# Patient Record
Sex: Female | Born: 1955 | Race: Black or African American | Hispanic: No | Marital: Married | Smoking: Former smoker
Health system: Southern US, Community
[De-identification: ages and names within clinical notes are randomized; demographics above are authoritative.]

## PROBLEM LIST (undated history)

## (undated) DIAGNOSIS — G609 Hereditary and idiopathic neuropathy, unspecified: Secondary | ICD-10-CM

## (undated) DIAGNOSIS — I509 Heart failure, unspecified: Secondary | ICD-10-CM

## (undated) DIAGNOSIS — F329 Major depressive disorder, single episode, unspecified: Secondary | ICD-10-CM

## (undated) DIAGNOSIS — F419 Anxiety disorder, unspecified: Secondary | ICD-10-CM

## (undated) DIAGNOSIS — J45909 Unspecified asthma, uncomplicated: Secondary | ICD-10-CM

## (undated) DIAGNOSIS — M549 Dorsalgia, unspecified: Secondary | ICD-10-CM

## (undated) DIAGNOSIS — C3490 Malignant neoplasm of unspecified part of unspecified bronchus or lung: Secondary | ICD-10-CM

## (undated) DIAGNOSIS — G8929 Other chronic pain: Secondary | ICD-10-CM

## (undated) DIAGNOSIS — E039 Hypothyroidism, unspecified: Secondary | ICD-10-CM

## (undated) DIAGNOSIS — E559 Vitamin D deficiency, unspecified: Secondary | ICD-10-CM

## (undated) DIAGNOSIS — IMO0002 Reserved for concepts with insufficient information to code with codable children: Secondary | ICD-10-CM

## (undated) DIAGNOSIS — J42 Unspecified chronic bronchitis: Secondary | ICD-10-CM

## (undated) DIAGNOSIS — R9431 Abnormal electrocardiogram [ECG] [EKG]: Secondary | ICD-10-CM

## (undated) DIAGNOSIS — Z9981 Dependence on supplemental oxygen: Secondary | ICD-10-CM

## (undated) DIAGNOSIS — J439 Emphysema, unspecified: Secondary | ICD-10-CM

## (undated) DIAGNOSIS — K861 Other chronic pancreatitis: Secondary | ICD-10-CM

## (undated) DIAGNOSIS — F17200 Nicotine dependence, unspecified, uncomplicated: Secondary | ICD-10-CM

## (undated) DIAGNOSIS — I10 Essential (primary) hypertension: Secondary | ICD-10-CM

## (undated) DIAGNOSIS — K219 Gastro-esophageal reflux disease without esophagitis: Secondary | ICD-10-CM

## (undated) DIAGNOSIS — B1920 Unspecified viral hepatitis C without hepatic coma: Secondary | ICD-10-CM

## (undated) DIAGNOSIS — E7800 Pure hypercholesterolemia, unspecified: Secondary | ICD-10-CM

## (undated) DIAGNOSIS — R609 Edema, unspecified: Secondary | ICD-10-CM

## (undated) DIAGNOSIS — L309 Dermatitis, unspecified: Secondary | ICD-10-CM

## (undated) DIAGNOSIS — M810 Age-related osteoporosis without current pathological fracture: Secondary | ICD-10-CM

## (undated) DIAGNOSIS — R7303 Prediabetes: Secondary | ICD-10-CM

## (undated) HISTORY — PX: BREAST BIOPSY: SHX20

## (undated) HISTORY — DX: Gastro-esophageal reflux disease without esophagitis: K21.9

## (undated) HISTORY — PX: CARDIAC CATHETERIZATION: SHX172

## (undated) HISTORY — DX: Age-related osteoporosis without current pathological fracture: M81.0

## (undated) HISTORY — DX: Essential (primary) hypertension: I10

## (undated) HISTORY — PX: TONSILLECTOMY: SUR1361

## (undated) HISTORY — DX: Other chronic pancreatitis: K86.1

## (undated) HISTORY — PX: TUBAL LIGATION: SHX77

## (undated) HISTORY — PX: DILATION AND CURETTAGE OF UTERUS: SHX78

## (undated) HISTORY — PX: COLONOSCOPY: SHX174

## (undated) HISTORY — DX: Reserved for concepts with insufficient information to code with codable children: IMO0002

## (undated) HISTORY — DX: Dermatitis, unspecified: L30.9

## (undated) HISTORY — PX: BRAIN SURGERY: SHX531

## (undated) HISTORY — DX: Emphysema, unspecified: J43.9

## (undated) HISTORY — DX: Major depressive disorder, single episode, unspecified: F32.9

## (undated) HISTORY — DX: Edema, unspecified: R60.9

## (undated) HISTORY — PX: LUMBAR LAMINECTOMY: SHX95

## (undated) HISTORY — DX: Hypothyroidism, unspecified: E03.9

## (undated) HISTORY — DX: Nicotine dependence, unspecified, uncomplicated: F17.200

## (undated) HISTORY — DX: Vitamin D deficiency, unspecified: E55.9

## (undated) HISTORY — PX: LAPAROSCOPIC CHOLECYSTECTOMY: SUR755

## (undated) HISTORY — PX: LUMBAR FUSION: SHX111

## (undated) HISTORY — DX: Hereditary and idiopathic neuropathy, unspecified: G60.9

---

## 1998-04-09 ENCOUNTER — Ambulatory Visit (HOSPITAL_COMMUNITY): Admission: RE | Admit: 1998-04-09 | Discharge: 1998-04-09 | Payer: Self-pay | Admitting: Neurosurgery

## 1998-04-21 ENCOUNTER — Ambulatory Visit (HOSPITAL_BASED_OUTPATIENT_CLINIC_OR_DEPARTMENT_OTHER): Admission: RE | Admit: 1998-04-21 | Discharge: 1998-04-21 | Payer: Self-pay | Admitting: Otolaryngology

## 1998-04-23 ENCOUNTER — Ambulatory Visit (HOSPITAL_COMMUNITY): Admission: RE | Admit: 1998-04-23 | Discharge: 1998-04-23 | Payer: Self-pay | Admitting: Neurosurgery

## 1998-05-08 ENCOUNTER — Ambulatory Visit (HOSPITAL_COMMUNITY): Admission: RE | Admit: 1998-05-08 | Discharge: 1998-05-08 | Payer: Self-pay | Admitting: Neurosurgery

## 1998-07-23 ENCOUNTER — Encounter: Payer: Self-pay | Admitting: Otolaryngology

## 1998-07-25 ENCOUNTER — Inpatient Hospital Stay (HOSPITAL_COMMUNITY): Admission: RE | Admit: 1998-07-25 | Discharge: 1998-07-29 | Payer: Self-pay | Admitting: Otolaryngology

## 1998-07-25 ENCOUNTER — Encounter: Payer: Self-pay | Admitting: Otolaryngology

## 1999-02-17 ENCOUNTER — Encounter: Payer: Self-pay | Admitting: Neurosurgery

## 1999-02-17 ENCOUNTER — Ambulatory Visit (HOSPITAL_COMMUNITY): Admission: RE | Admit: 1999-02-17 | Discharge: 1999-02-17 | Payer: Self-pay | Admitting: Neurosurgery

## 1999-05-11 ENCOUNTER — Emergency Department (HOSPITAL_COMMUNITY): Admission: EM | Admit: 1999-05-11 | Discharge: 1999-05-11 | Payer: Self-pay | Admitting: Emergency Medicine

## 1999-05-11 ENCOUNTER — Encounter: Payer: Self-pay | Admitting: *Deleted

## 1999-07-31 ENCOUNTER — Encounter: Admission: RE | Admit: 1999-07-31 | Discharge: 1999-07-31 | Payer: Self-pay | Admitting: Emergency Medicine

## 1999-07-31 ENCOUNTER — Encounter: Payer: Self-pay | Admitting: Emergency Medicine

## 1999-09-04 ENCOUNTER — Encounter: Admission: RE | Admit: 1999-09-04 | Discharge: 1999-09-04 | Payer: Self-pay | Admitting: Emergency Medicine

## 1999-09-04 ENCOUNTER — Encounter: Payer: Self-pay | Admitting: Emergency Medicine

## 1999-09-17 ENCOUNTER — Other Ambulatory Visit: Admission: RE | Admit: 1999-09-17 | Discharge: 1999-09-17 | Payer: Self-pay | Admitting: Obstetrics & Gynecology

## 1999-09-17 ENCOUNTER — Encounter: Payer: Self-pay | Admitting: Neurosurgery

## 1999-09-18 ENCOUNTER — Other Ambulatory Visit: Admission: RE | Admit: 1999-09-18 | Discharge: 1999-09-18 | Payer: Self-pay | Admitting: Obstetrics & Gynecology

## 1999-09-18 ENCOUNTER — Encounter (INDEPENDENT_AMBULATORY_CARE_PROVIDER_SITE_OTHER): Payer: Self-pay

## 1999-09-21 ENCOUNTER — Encounter: Payer: Self-pay | Admitting: Neurosurgery

## 1999-09-21 ENCOUNTER — Ambulatory Visit (HOSPITAL_COMMUNITY): Admission: RE | Admit: 1999-09-21 | Discharge: 1999-09-22 | Payer: Self-pay | Admitting: Neurosurgery

## 2000-01-18 ENCOUNTER — Inpatient Hospital Stay (HOSPITAL_COMMUNITY): Admission: RE | Admit: 2000-01-18 | Discharge: 2000-01-21 | Payer: Self-pay | Admitting: Neurosurgery

## 2000-02-16 ENCOUNTER — Encounter: Admission: RE | Admit: 2000-02-16 | Discharge: 2000-02-16 | Payer: Self-pay | Admitting: Neurosurgery

## 2000-02-16 ENCOUNTER — Encounter: Payer: Self-pay | Admitting: Neurosurgery

## 2000-03-29 ENCOUNTER — Encounter: Payer: Self-pay | Admitting: Neurosurgery

## 2000-03-29 ENCOUNTER — Encounter: Admission: RE | Admit: 2000-03-29 | Discharge: 2000-03-29 | Payer: Self-pay | Admitting: Neurosurgery

## 2000-06-21 ENCOUNTER — Encounter: Payer: Self-pay | Admitting: Neurosurgery

## 2000-06-21 ENCOUNTER — Encounter: Admission: RE | Admit: 2000-06-21 | Discharge: 2000-06-21 | Payer: Self-pay | Admitting: Neurosurgery

## 2000-09-23 ENCOUNTER — Other Ambulatory Visit: Admission: RE | Admit: 2000-09-23 | Discharge: 2000-09-23 | Payer: Self-pay | Admitting: Obstetrics & Gynecology

## 2000-11-29 ENCOUNTER — Encounter: Admission: RE | Admit: 2000-11-29 | Discharge: 2000-11-29 | Payer: Self-pay | Admitting: Neurosurgery

## 2000-11-29 ENCOUNTER — Encounter: Payer: Self-pay | Admitting: Neurosurgery

## 2000-12-12 ENCOUNTER — Ambulatory Visit (HOSPITAL_COMMUNITY): Admission: RE | Admit: 2000-12-12 | Discharge: 2000-12-12 | Payer: Self-pay | Admitting: Neurosurgery

## 2000-12-12 ENCOUNTER — Encounter: Payer: Self-pay | Admitting: Neurosurgery

## 2000-12-26 ENCOUNTER — Ambulatory Visit (HOSPITAL_COMMUNITY): Admission: RE | Admit: 2000-12-26 | Discharge: 2000-12-26 | Payer: Self-pay | Admitting: Neurosurgery

## 2000-12-26 ENCOUNTER — Encounter: Payer: Self-pay | Admitting: Neurosurgery

## 2001-01-09 ENCOUNTER — Encounter: Admission: RE | Admit: 2001-01-09 | Discharge: 2001-01-09 | Payer: Self-pay | Admitting: Neurosurgery

## 2001-01-09 ENCOUNTER — Encounter: Payer: Self-pay | Admitting: Neurosurgery

## 2001-01-14 ENCOUNTER — Encounter: Payer: Self-pay | Admitting: Emergency Medicine

## 2001-01-14 ENCOUNTER — Emergency Department (HOSPITAL_COMMUNITY): Admission: EM | Admit: 2001-01-14 | Discharge: 2001-01-14 | Payer: Self-pay | Admitting: Emergency Medicine

## 2001-01-19 ENCOUNTER — Ambulatory Visit (HOSPITAL_COMMUNITY): Admission: RE | Admit: 2001-01-19 | Discharge: 2001-01-19 | Payer: Self-pay | Admitting: Obstetrics & Gynecology

## 2001-01-30 ENCOUNTER — Encounter: Payer: Self-pay | Admitting: Internal Medicine

## 2001-01-30 ENCOUNTER — Inpatient Hospital Stay (HOSPITAL_COMMUNITY): Admission: EM | Admit: 2001-01-30 | Discharge: 2001-02-06 | Payer: Self-pay | Admitting: Emergency Medicine

## 2001-02-01 ENCOUNTER — Encounter: Payer: Self-pay | Admitting: Internal Medicine

## 2001-02-03 ENCOUNTER — Encounter: Payer: Self-pay | Admitting: Orthopedic Surgery

## 2001-03-21 ENCOUNTER — Encounter: Payer: Self-pay | Admitting: Neurosurgery

## 2001-03-21 ENCOUNTER — Encounter: Admission: RE | Admit: 2001-03-21 | Discharge: 2001-03-21 | Payer: Self-pay | Admitting: Neurosurgery

## 2001-04-13 ENCOUNTER — Ambulatory Visit (HOSPITAL_COMMUNITY): Admission: RE | Admit: 2001-04-13 | Discharge: 2001-04-13 | Payer: Self-pay | Admitting: Internal Medicine

## 2001-04-13 ENCOUNTER — Encounter: Payer: Self-pay | Admitting: Internal Medicine

## 2001-05-13 ENCOUNTER — Inpatient Hospital Stay (HOSPITAL_COMMUNITY): Admission: EM | Admit: 2001-05-13 | Discharge: 2001-05-17 | Payer: Self-pay | Admitting: Emergency Medicine

## 2001-05-13 ENCOUNTER — Encounter: Payer: Self-pay | Admitting: Emergency Medicine

## 2001-05-15 ENCOUNTER — Encounter: Payer: Self-pay | Admitting: Internal Medicine

## 2001-06-19 ENCOUNTER — Other Ambulatory Visit: Admission: RE | Admit: 2001-06-19 | Discharge: 2001-06-19 | Payer: Self-pay | Admitting: Emergency Medicine

## 2001-07-07 ENCOUNTER — Encounter: Admission: RE | Admit: 2001-07-07 | Discharge: 2001-07-07 | Payer: Self-pay | Admitting: Emergency Medicine

## 2001-07-07 ENCOUNTER — Encounter: Payer: Self-pay | Admitting: Emergency Medicine

## 2002-03-26 ENCOUNTER — Encounter: Admission: RE | Admit: 2002-03-26 | Discharge: 2002-03-26 | Payer: Self-pay | Admitting: Emergency Medicine

## 2002-03-26 ENCOUNTER — Encounter: Payer: Self-pay | Admitting: Emergency Medicine

## 2002-07-24 ENCOUNTER — Encounter: Admission: RE | Admit: 2002-07-24 | Discharge: 2002-10-22 | Payer: Self-pay | Admitting: Anesthesiology

## 2002-10-25 ENCOUNTER — Encounter: Admission: RE | Admit: 2002-10-25 | Discharge: 2003-01-23 | Payer: Self-pay

## 2003-01-22 ENCOUNTER — Encounter
Admission: RE | Admit: 2003-01-22 | Discharge: 2003-04-22 | Payer: Self-pay | Admitting: Physical Medicine & Rehabilitation

## 2003-05-04 ENCOUNTER — Inpatient Hospital Stay (HOSPITAL_COMMUNITY): Admission: EM | Admit: 2003-05-04 | Discharge: 2003-05-08 | Payer: Self-pay | Admitting: Emergency Medicine

## 2003-05-06 ENCOUNTER — Encounter: Payer: Self-pay | Admitting: Gastroenterology

## 2003-05-06 ENCOUNTER — Encounter
Admission: RE | Admit: 2003-05-06 | Discharge: 2003-08-04 | Payer: Self-pay | Admitting: Physical Medicine & Rehabilitation

## 2003-05-06 ENCOUNTER — Encounter: Payer: Self-pay | Admitting: Internal Medicine

## 2003-09-03 ENCOUNTER — Encounter
Admission: RE | Admit: 2003-09-03 | Discharge: 2003-12-02 | Payer: Self-pay | Admitting: Physical Medicine & Rehabilitation

## 2004-01-01 ENCOUNTER — Encounter
Admission: RE | Admit: 2004-01-01 | Discharge: 2004-03-31 | Payer: Self-pay | Admitting: Physical Medicine & Rehabilitation

## 2004-04-28 ENCOUNTER — Encounter
Admission: RE | Admit: 2004-04-28 | Discharge: 2004-07-02 | Payer: Self-pay | Admitting: Physical Medicine & Rehabilitation

## 2004-07-02 ENCOUNTER — Encounter
Admission: RE | Admit: 2004-07-02 | Discharge: 2004-09-30 | Payer: Self-pay | Admitting: Physical Medicine & Rehabilitation

## 2004-07-06 ENCOUNTER — Ambulatory Visit: Payer: Self-pay | Admitting: Physical Medicine & Rehabilitation

## 2004-08-28 ENCOUNTER — Ambulatory Visit: Payer: Self-pay | Admitting: Internal Medicine

## 2004-09-14 ENCOUNTER — Ambulatory Visit: Payer: Self-pay | Admitting: Physical Medicine & Rehabilitation

## 2004-12-10 ENCOUNTER — Encounter
Admission: RE | Admit: 2004-12-10 | Discharge: 2005-03-10 | Payer: Self-pay | Admitting: Physical Medicine & Rehabilitation

## 2004-12-11 ENCOUNTER — Ambulatory Visit: Payer: Self-pay | Admitting: Physical Medicine & Rehabilitation

## 2005-03-10 ENCOUNTER — Ambulatory Visit: Payer: Self-pay | Admitting: Physical Medicine & Rehabilitation

## 2005-03-10 ENCOUNTER — Encounter
Admission: RE | Admit: 2005-03-10 | Discharge: 2005-06-08 | Payer: Self-pay | Admitting: Physical Medicine & Rehabilitation

## 2005-06-03 ENCOUNTER — Ambulatory Visit: Payer: Self-pay | Admitting: Physical Medicine & Rehabilitation

## 2005-09-03 ENCOUNTER — Encounter
Admission: RE | Admit: 2005-09-03 | Discharge: 2005-12-02 | Payer: Self-pay | Admitting: Physical Medicine & Rehabilitation

## 2005-09-03 ENCOUNTER — Ambulatory Visit: Payer: Self-pay | Admitting: Physical Medicine & Rehabilitation

## 2005-11-29 ENCOUNTER — Ambulatory Visit: Payer: Self-pay | Admitting: Physical Medicine & Rehabilitation

## 2006-01-21 ENCOUNTER — Encounter
Admission: RE | Admit: 2006-01-21 | Discharge: 2006-04-21 | Payer: Self-pay | Admitting: Physical Medicine & Rehabilitation

## 2006-01-21 ENCOUNTER — Ambulatory Visit: Payer: Self-pay | Admitting: Physical Medicine & Rehabilitation

## 2006-02-08 ENCOUNTER — Ambulatory Visit: Payer: Self-pay | Admitting: Internal Medicine

## 2006-02-17 ENCOUNTER — Ambulatory Visit: Payer: Self-pay | Admitting: Internal Medicine

## 2006-02-21 ENCOUNTER — Ambulatory Visit: Payer: Self-pay | Admitting: Gastroenterology

## 2006-02-24 ENCOUNTER — Ambulatory Visit (HOSPITAL_COMMUNITY): Admission: RE | Admit: 2006-02-24 | Discharge: 2006-02-24 | Payer: Self-pay | Admitting: Gastroenterology

## 2006-03-02 ENCOUNTER — Ambulatory Visit: Payer: Self-pay | Admitting: Gastroenterology

## 2006-03-17 ENCOUNTER — Ambulatory Visit: Payer: Self-pay | Admitting: Physical Medicine & Rehabilitation

## 2006-03-30 ENCOUNTER — Ambulatory Visit: Payer: Self-pay | Admitting: Internal Medicine

## 2006-05-17 ENCOUNTER — Ambulatory Visit: Payer: Self-pay | Admitting: Physical Medicine & Rehabilitation

## 2006-05-17 ENCOUNTER — Encounter
Admission: RE | Admit: 2006-05-17 | Discharge: 2006-08-15 | Payer: Self-pay | Admitting: Physical Medicine & Rehabilitation

## 2006-08-16 ENCOUNTER — Encounter
Admission: RE | Admit: 2006-08-16 | Discharge: 2006-11-14 | Payer: Self-pay | Admitting: Physical Medicine & Rehabilitation

## 2006-08-16 ENCOUNTER — Ambulatory Visit: Payer: Self-pay | Admitting: Physical Medicine & Rehabilitation

## 2006-08-17 ENCOUNTER — Emergency Department (HOSPITAL_COMMUNITY): Admission: EM | Admit: 2006-08-17 | Discharge: 2006-08-17 | Payer: Self-pay | Admitting: Emergency Medicine

## 2006-08-23 ENCOUNTER — Ambulatory Visit: Payer: Self-pay | Admitting: Gastroenterology

## 2006-09-27 ENCOUNTER — Emergency Department (HOSPITAL_COMMUNITY): Admission: EM | Admit: 2006-09-27 | Discharge: 2006-09-27 | Payer: Self-pay | Admitting: Emergency Medicine

## 2006-10-06 ENCOUNTER — Ambulatory Visit: Payer: Self-pay | Admitting: Gastroenterology

## 2006-10-20 ENCOUNTER — Ambulatory Visit (HOSPITAL_COMMUNITY): Admission: RE | Admit: 2006-10-20 | Discharge: 2006-10-20 | Payer: Self-pay | Admitting: Gastroenterology

## 2006-10-20 ENCOUNTER — Encounter (INDEPENDENT_AMBULATORY_CARE_PROVIDER_SITE_OTHER): Payer: Self-pay | Admitting: *Deleted

## 2006-12-13 ENCOUNTER — Ambulatory Visit: Payer: Self-pay | Admitting: Physical Medicine & Rehabilitation

## 2006-12-13 ENCOUNTER — Encounter
Admission: RE | Admit: 2006-12-13 | Discharge: 2007-03-13 | Payer: Self-pay | Admitting: Physical Medicine & Rehabilitation

## 2006-12-27 ENCOUNTER — Ambulatory Visit: Payer: Self-pay | Admitting: Gastroenterology

## 2007-02-12 ENCOUNTER — Emergency Department (HOSPITAL_COMMUNITY): Admission: EM | Admit: 2007-02-12 | Discharge: 2007-02-12 | Payer: Self-pay | Admitting: Emergency Medicine

## 2007-03-31 ENCOUNTER — Ambulatory Visit: Payer: Self-pay | Admitting: Physical Medicine & Rehabilitation

## 2007-03-31 ENCOUNTER — Encounter
Admission: RE | Admit: 2007-03-31 | Discharge: 2007-06-29 | Payer: Self-pay | Admitting: Physical Medicine & Rehabilitation

## 2007-04-11 ENCOUNTER — Emergency Department (HOSPITAL_COMMUNITY): Admission: EM | Admit: 2007-04-11 | Discharge: 2007-04-12 | Payer: Self-pay | Admitting: Emergency Medicine

## 2007-04-17 ENCOUNTER — Encounter: Admission: RE | Admit: 2007-04-17 | Discharge: 2007-04-17 | Payer: Self-pay | Admitting: Emergency Medicine

## 2007-04-20 ENCOUNTER — Encounter: Admission: RE | Admit: 2007-04-20 | Discharge: 2007-04-20 | Payer: Self-pay | Admitting: Emergency Medicine

## 2007-06-28 ENCOUNTER — Ambulatory Visit: Payer: Self-pay | Admitting: Physical Medicine & Rehabilitation

## 2007-06-28 ENCOUNTER — Encounter
Admission: RE | Admit: 2007-06-28 | Discharge: 2007-07-03 | Payer: Self-pay | Admitting: Physical Medicine & Rehabilitation

## 2007-09-19 ENCOUNTER — Ambulatory Visit: Payer: Self-pay | Admitting: Physical Medicine & Rehabilitation

## 2007-09-19 ENCOUNTER — Encounter
Admission: RE | Admit: 2007-09-19 | Discharge: 2007-09-21 | Payer: Self-pay | Admitting: Physical Medicine & Rehabilitation

## 2007-11-02 ENCOUNTER — Emergency Department (HOSPITAL_COMMUNITY): Admission: EM | Admit: 2007-11-02 | Discharge: 2007-11-02 | Payer: Self-pay | Admitting: Emergency Medicine

## 2007-11-03 ENCOUNTER — Emergency Department (HOSPITAL_COMMUNITY): Admission: EM | Admit: 2007-11-03 | Discharge: 2007-11-03 | Payer: Self-pay | Admitting: Emergency Medicine

## 2008-01-09 ENCOUNTER — Encounter
Admission: RE | Admit: 2008-01-09 | Discharge: 2008-01-11 | Payer: Self-pay | Admitting: Physical Medicine & Rehabilitation

## 2008-01-11 ENCOUNTER — Ambulatory Visit: Payer: Self-pay | Admitting: Physical Medicine & Rehabilitation

## 2008-05-01 ENCOUNTER — Encounter
Admission: RE | Admit: 2008-05-01 | Discharge: 2008-05-02 | Payer: Self-pay | Admitting: Physical Medicine & Rehabilitation

## 2008-05-02 ENCOUNTER — Ambulatory Visit: Payer: Self-pay | Admitting: Physical Medicine & Rehabilitation

## 2008-08-22 ENCOUNTER — Encounter
Admission: RE | Admit: 2008-08-22 | Discharge: 2008-08-23 | Payer: Self-pay | Admitting: Physical Medicine & Rehabilitation

## 2008-08-23 ENCOUNTER — Ambulatory Visit: Payer: Self-pay | Admitting: Physical Medicine & Rehabilitation

## 2008-12-27 ENCOUNTER — Encounter: Admission: RE | Admit: 2008-12-27 | Discharge: 2008-12-27 | Payer: Self-pay | Admitting: Internal Medicine

## 2009-02-17 ENCOUNTER — Encounter: Payer: Self-pay | Admitting: Internal Medicine

## 2009-03-05 ENCOUNTER — Encounter: Admission: RE | Admit: 2009-03-05 | Discharge: 2009-03-05 | Payer: Self-pay | Admitting: Internal Medicine

## 2009-06-06 ENCOUNTER — Ambulatory Visit: Payer: Self-pay | Admitting: Internal Medicine

## 2009-06-06 DIAGNOSIS — G609 Hereditary and idiopathic neuropathy, unspecified: Secondary | ICD-10-CM | POA: Insufficient documentation

## 2009-06-06 DIAGNOSIS — F3289 Other specified depressive episodes: Secondary | ICD-10-CM

## 2009-06-06 DIAGNOSIS — F418 Other specified anxiety disorders: Secondary | ICD-10-CM

## 2009-06-06 DIAGNOSIS — E559 Vitamin D deficiency, unspecified: Secondary | ICD-10-CM

## 2009-06-06 DIAGNOSIS — B182 Chronic viral hepatitis C: Secondary | ICD-10-CM

## 2009-06-06 DIAGNOSIS — K219 Gastro-esophageal reflux disease without esophagitis: Secondary | ICD-10-CM

## 2009-06-06 DIAGNOSIS — I10 Essential (primary) hypertension: Secondary | ICD-10-CM

## 2009-06-06 DIAGNOSIS — M810 Age-related osteoporosis without current pathological fracture: Secondary | ICD-10-CM

## 2009-06-06 DIAGNOSIS — F329 Major depressive disorder, single episode, unspecified: Secondary | ICD-10-CM

## 2009-06-06 DIAGNOSIS — J4530 Mild persistent asthma, uncomplicated: Secondary | ICD-10-CM

## 2009-06-06 HISTORY — DX: Age-related osteoporosis without current pathological fracture: M81.0

## 2009-06-06 HISTORY — DX: Other specified depressive episodes: F32.89

## 2009-06-06 HISTORY — DX: Gastro-esophageal reflux disease without esophagitis: K21.9

## 2009-06-06 HISTORY — DX: Essential (primary) hypertension: I10

## 2009-06-06 HISTORY — DX: Major depressive disorder, single episode, unspecified: F32.9

## 2009-06-06 HISTORY — DX: Hereditary and idiopathic neuropathy, unspecified: G60.9

## 2009-06-06 HISTORY — DX: Vitamin D deficiency, unspecified: E55.9

## 2009-06-06 LAB — CONVERTED CEMR LAB
ALT: 66 units/L — ABNORMAL HIGH (ref 0–35)
Albumin: 3.7 g/dL (ref 3.5–5.2)
Calcium: 9.3 mg/dL (ref 8.4–10.5)
Chloride: 102 meq/L (ref 96–112)
Creatinine, Ser: 0.8 mg/dL (ref 0.4–1.2)
Eosinophils Absolute: 0.2 10*3/uL (ref 0.0–0.7)
Eosinophils Relative: 3 % (ref 0.0–5.0)
Folate: 10.6 ng/mL
Glucose, Bld: 62 mg/dL — ABNORMAL LOW (ref 70–99)
HCT: 44.8 % (ref 36.0–46.0)
Hemoglobin: 15 g/dL (ref 12.0–15.0)
Iron: 157 ug/dL — ABNORMAL HIGH (ref 42–145)
Lymphocytes Relative: 33.8 % (ref 12.0–46.0)
MCV: 93.8 fL (ref 78.0–100.0)
Monocytes Relative: 8.2 % (ref 3.0–12.0)
Neutro Abs: 3.1 10*3/uL (ref 1.4–7.7)
Potassium: 4.2 meq/L (ref 3.5–5.1)
Saturation Ratios: 45.1 % (ref 20.0–50.0)
Sodium: 138 meq/L (ref 135–145)
TSH: 1.46 microintl units/mL (ref 0.35–5.50)
Total Bilirubin: 0.7 mg/dL (ref 0.3–1.2)
Total Protein: 6.9 g/dL (ref 6.0–8.3)
Transferrin: 248.9 mg/dL (ref 212.0–360.0)
Vitamin B-12: 540 pg/mL (ref 211–911)

## 2009-06-10 ENCOUNTER — Encounter: Payer: Self-pay | Admitting: Internal Medicine

## 2009-06-24 ENCOUNTER — Encounter: Payer: Self-pay | Admitting: Internal Medicine

## 2010-03-20 ENCOUNTER — Ambulatory Visit: Payer: Self-pay | Admitting: Pulmonary Disease

## 2010-03-20 ENCOUNTER — Inpatient Hospital Stay (HOSPITAL_COMMUNITY): Admission: EM | Admit: 2010-03-20 | Discharge: 2010-03-24 | Payer: Self-pay | Admitting: Emergency Medicine

## 2010-03-23 ENCOUNTER — Ambulatory Visit: Payer: Self-pay | Admitting: Psychiatry

## 2010-03-30 ENCOUNTER — Ambulatory Visit: Payer: Self-pay | Admitting: Internal Medicine

## 2010-05-11 ENCOUNTER — Encounter: Payer: Self-pay | Admitting: Internal Medicine

## 2010-05-11 ENCOUNTER — Encounter (INDEPENDENT_AMBULATORY_CARE_PROVIDER_SITE_OTHER): Payer: Self-pay | Admitting: Emergency Medicine

## 2010-05-11 ENCOUNTER — Ambulatory Visit: Payer: Self-pay | Admitting: Vascular Surgery

## 2010-05-11 ENCOUNTER — Emergency Department (HOSPITAL_COMMUNITY): Admission: EM | Admit: 2010-05-11 | Discharge: 2010-05-11 | Payer: Self-pay | Admitting: Emergency Medicine

## 2010-05-20 ENCOUNTER — Ambulatory Visit: Payer: Self-pay | Admitting: Internal Medicine

## 2010-05-20 DIAGNOSIS — R609 Edema, unspecified: Secondary | ICD-10-CM

## 2010-05-20 HISTORY — DX: Edema, unspecified: R60.9

## 2010-05-20 LAB — CONVERTED CEMR LAB
AST: 117 units/L — ABNORMAL HIGH (ref 0–37)
Albumin: 3.1 g/dL — ABNORMAL LOW (ref 3.5–5.2)
Alkaline Phosphatase: 79 units/L (ref 39–117)
Prothrombin Time: 11.6 s (ref 9.7–11.8)

## 2010-05-21 ENCOUNTER — Telehealth (INDEPENDENT_AMBULATORY_CARE_PROVIDER_SITE_OTHER): Payer: Self-pay | Admitting: *Deleted

## 2010-06-01 ENCOUNTER — Ambulatory Visit: Payer: Self-pay | Admitting: Cardiology

## 2010-06-01 ENCOUNTER — Encounter: Payer: Self-pay | Admitting: Internal Medicine

## 2010-06-01 ENCOUNTER — Ambulatory Visit: Payer: Self-pay

## 2010-06-01 ENCOUNTER — Ambulatory Visit (HOSPITAL_COMMUNITY): Admission: RE | Admit: 2010-06-01 | Discharge: 2010-06-01 | Payer: Self-pay | Admitting: Internal Medicine

## 2010-06-05 ENCOUNTER — Ambulatory Visit: Payer: Self-pay | Admitting: Internal Medicine

## 2010-06-05 DIAGNOSIS — E039 Hypothyroidism, unspecified: Secondary | ICD-10-CM

## 2010-06-05 DIAGNOSIS — F17200 Nicotine dependence, unspecified, uncomplicated: Secondary | ICD-10-CM

## 2010-06-05 HISTORY — DX: Nicotine dependence, unspecified, uncomplicated: F17.200

## 2010-06-05 HISTORY — DX: Hypothyroidism, unspecified: E03.9

## 2010-06-25 ENCOUNTER — Encounter: Payer: Self-pay | Admitting: Internal Medicine

## 2010-06-25 ENCOUNTER — Ambulatory Visit: Payer: Self-pay | Admitting: Gastroenterology

## 2010-08-06 ENCOUNTER — Ambulatory Visit: Payer: Self-pay | Admitting: Internal Medicine

## 2010-08-06 DIAGNOSIS — M5416 Radiculopathy, lumbar region: Secondary | ICD-10-CM

## 2010-08-06 LAB — CONVERTED CEMR LAB
Albumin: 3.8 g/dL (ref 3.5–5.2)
BUN: 8 mg/dL (ref 6–23)
Basophils Relative: 0.4 % (ref 0.0–3.0)
Chloride: 99 meq/L (ref 96–112)
Eosinophils Absolute: 0.1 10*3/uL (ref 0.0–0.7)
HCT: 44.8 % (ref 36.0–46.0)
Hemoglobin: 15.3 g/dL — ABNORMAL HIGH (ref 12.0–15.0)
Lymphocytes Relative: 35.7 % (ref 12.0–46.0)
Monocytes Absolute: 0.5 10*3/uL (ref 0.1–1.0)
Monocytes Relative: 7.8 % (ref 3.0–12.0)
Neutro Abs: 3.8 10*3/uL (ref 1.4–7.7)
Platelets: 207 10*3/uL (ref 150.0–400.0)
Potassium: 4.4 meq/L (ref 3.5–5.1)
RBC: 4.83 M/uL (ref 3.87–5.11)
RDW: 13.7 % (ref 11.5–14.6)
TSH: 1.28 microintl units/mL (ref 0.35–5.50)
Total Bilirubin: 0.6 mg/dL (ref 0.3–1.2)
WBC: 7 10*3/uL (ref 4.5–10.5)

## 2010-08-20 ENCOUNTER — Encounter: Payer: Self-pay | Admitting: Internal Medicine

## 2010-08-24 ENCOUNTER — Telehealth: Payer: Self-pay | Admitting: Internal Medicine

## 2010-10-01 ENCOUNTER — Encounter: Payer: Self-pay | Admitting: Internal Medicine

## 2010-10-01 ENCOUNTER — Ambulatory Visit: Payer: Self-pay | Admitting: Gastroenterology

## 2010-10-24 ENCOUNTER — Encounter: Payer: Self-pay | Admitting: Emergency Medicine

## 2010-10-25 ENCOUNTER — Encounter: Payer: Self-pay | Admitting: Gastroenterology

## 2010-10-29 ENCOUNTER — Ambulatory Visit
Admission: RE | Admit: 2010-10-29 | Discharge: 2010-10-29 | Payer: Self-pay | Source: Home / Self Care | Attending: Gastroenterology | Admitting: Gastroenterology

## 2010-10-29 DIAGNOSIS — B182 Chronic viral hepatitis C: Secondary | ICD-10-CM

## 2010-11-03 NOTE — Assessment & Plan Note (Signed)
Summary: [facility]  #   Vital Signs:  Patient profile:   55 year old female Height:      64 inches Weight:      165 pounds BMI:     28.42 O2 Sat:      97 % on Room air Temp:     98.0 degrees F oral Pulse rate:   86 / minute Pulse rhythm:   regular Resp:     16 per minute BP sitting:   150 / 70  (left arm) Cuff size:   large  Vitals Entered By: Estell Harpin CMA (March 30, 2010 1:24 PM)  Nutrition Counseling: Patient's BMI is greater than 25 and therefore counseled on weight management options.  O2 Flow:  Room air CC: hospital follow p and need med refills   Primary Care Provider:  [name] MD  CC:  hospital follow p and need med refills.  History of Present Illness: She returns for f/up after having a brief inpt. stay after she combined xanax and klonopin (accidentally) and became unconscious, got intubated, and developed aspiration pna. She feels much better today.  Preventive Screening-Counseling & Management  Alcohol-Tobacco     Alcohol drinks/day: 0     Smoking Status: quit < 6 months     Smoking Cessation Counseling: yes     Smoke Cessation Stage: quit     Year Started: 1975     Year Quit: 2011     Pack years: 30  Hep-HIV-STD-Contraception     Hepatitis Risk: risk noted     Hepatitis Risk Counseling: to avoid increased hepatitis risk     HIV Risk: no risk noted     STD Risk: no risk noted      Sexual History:  currently monogamous.        Drug Use:  never.        Blood Transfusions:  no.    Medications Prior to Update: 1)  Tramadol Hcl 50 Mg Tabs (Tramadol Hcl) .... One By Mouth Q6 Hours As Needed For Pain 2)  Gabapentin 300 Mg Caps (Gabapentin) .... Take 1 Tablet By Mouth Three Times A Day 3)  Benicar 40 Mg Tabs (Olmesartan Medoxomil) .... Take 1 Tablet By Mouth Once A Day 4)  Ibuprofen 800 Mg Tabs (Ibuprofen) .... Take 1 Tablet By Mouth Three Times A Day 5)  Promethazine Hcl 25 Mg Tabs (Promethazine Hcl) .[name].. 1 Every 6hrs As Needed 6)   Alprazolam 0.5 Mg Tabs (Alprazolam) .... Take 1 Tablet By Mouth Three Times A Day 7)  Celexa 40 Mg Tabs (Citalopram Hydrobromide) .[name].. 1 1/2 Once Daily 8)  Zyprexa 20 Mg Tabs (Olanzapine) .... Take 1 Tablet By Mouth Once A Day 9)  Proair Hfa 108 (90 Base) Mcg/act Aers (Albuterol Sulfate) .... 2 Puffs Qid As Needed Wheezing 10)  Triamcinolone Acetonide 0.1 % Oint (Triamcinolone Acetonide) .... Apply To Aa Three Times A Day As Needed  Current Medications (verified): 1)  Gabapentin 300 Mg Caps (Gabapentin) .... Take 1 Tablet By Mouth Three Times A Day 2)  Ibuprofen 800 Mg Tabs (Ibuprofen) .... Take 1 Tablet By Mouth Three Times A Day 3)  Promethazine Hcl 25 Mg Tabs (Promethazine Hcl) .[name].. 1 Every 6hrs As Needed 4)  Alprazolam 0.5 Mg Tabs (Alprazolam) .... Take 1 Tablet By Mouth Three Times A Day 5)  Celexa 40 Mg Tabs (Citalopram Hydrobromide) .[name].. 1 1/2 Once Daily 6)  Zyprexa 20 Mg Tabs (Olanzapine) .... Take 1 Tablet By Mouth Once  A Day 7)  Proair Hfa 108 (90 Base) Mcg/act Aers (Albuterol Sulfate) .... 2 Puffs Qid As Needed Wheezing 8)  Triamcinolone Acetonide 0.1 % Oint (Triamcinolone Acetonide) .... Apply To Aa Three Times A Day As Needed 9)  Diovan 160 Mg Tabs (Valsartan) .... One By Mouth Once Daily For High Blood Pressure  Allergies (verified): 1)  ! Demerol 2)  ! Naprosyn  Past History:  Past Medical History: Last updated: 06/06/2009 Asthma Depression GERD Hypertension Osteoporosis Eczema  Past Surgical History: Last updated: 06/06/2009 Cholecystectomy Lumpectomy Tubal ligation Tonsillectomy Lumbar laminectomy  Family History: Last updated: 06/06/2009 COPD-father Family History Hypertension  Social History: Last updated: 06/06/2009 Married Alcohol use-no Drug use-no Regular exercise-no Disabled  Risk Factors: Alcohol Use: 0 (03/30/2010) Exercise: no (06/06/2009)  Risk Factors: Smoking Status: quit < 6 months (03/30/2010)  Family History: Reviewed  history from 06/06/2009 and no changes required. COPD-father Family History Hypertension  Social History: Reviewed history from 06/06/2009 and no changes required. Married Alcohol use-no Drug use-no Regular exercise-no Disabled Smoking Status:  quit < 6 months Hepatitis Risk:  risk noted HIV Risk:  no risk noted STD Risk:  no risk noted Sexual History:  currently monogamous Drug Use:  never Blood Transfusions:  no  Review of Systems  The patient denies anorexia, fever, weight loss, weight gain, hoarseness, chest pain, syncope, dyspnea on exertion, peripheral edema, prolonged cough, headaches, hemoptysis, abdominal pain, melena, hematochezia, severe indigestion/heartburn, suspicious skin lesions, difficulty walking, depression, enlarged lymph nodes, and angioedema.   General:  Denies chills, fatigue, fever, loss of appetite, malaise, and sweats. Resp:  Complains of wheezing; denies chest discomfort, chest pain with inspiration, cough, coughing up blood, pleuritic, shortness of breath, and sputum productive. GI:  Denies abdominal pain, constipation, diarrhea, gas, loss of appetite, nausea, vomiting, vomiting blood, and yellowish skin color. Psych:  Complains of anxiety and panic attacks; denies alternate hallucination ( auditory/visual), depression, easily angered, easily tearful, irritability, mental problems, sense of great danger, suicidal thoughts/plans, thoughts of violence, unusual visions or sounds, and thoughts /plans of harming others.  Physical Exam  General:  alert, well-developed, well-nourished, well-hydrated, appropriate dress, normal appearance, healthy-appearing, and cooperative to examination.   Head:  normocephalic and atraumatic.   Eyes:  No icterus Mouth:  Oral mucosa and oropharynx without lesions or exudates.  Teeth in good repair. Neck:  No deformities, masses, or tenderness noted. Lungs:  Normal respiratory effort, chest expands symmetrically. Lungs are clear  to auscultation, no crackles or wheezes. Heart:  Normal rate and regular rhythm. S1 and S2 normal without gallop, murmur, click, rub or other extra sounds. Abdomen:  Bowel sounds positive,abdomen soft and non-tender without masses, organomegaly or hernias noted.abdominal scar(s).   Msk:  normal ROM, no joint tenderness, no joint swelling, and no joint warmth.   Pulses:  R and L carotid,radial,femoral,dorsalis pedis and posterior tibial pulses are full and equal bilaterally Extremities:  No clubbing, cyanosis, edema, or deformity noted with normal full range of motion of all joints.   Neurologic:  alert & oriented X3, cranial nerves II-XII intact, strength normal in all extremities, sensation intact to light touch, gait normal, and DTRs symmetrical and normal.   Skin:  Intact without suspicious lesions or rashes Cervical Nodes:  no anterior cervical adenopathy and no posterior cervical adenopathy.   Axillary Nodes:  no R axillary adenopathy and no L axillary adenopathy.   Psych:  Cognition and judgment appear intact. Alert and cooperative with normal attention span and concentration. No apparent delusions, illusions,  hallucinations   Impression & Recommendations:  Problem # 1:  HYPERTENSION (ICD-401.9) Assessment Unchanged  The following medications were removed from the medication list:    Benicar 40 Mg Tabs (Olmesartan medoxomil) .[name]... Take 1 tablet by mouth once a day Her updated medication list for this problem includes:    Diovan 160 Mg Tabs (Valsartan) ..... One by mouth once daily for high blood pressure  BP today: 150/70 Prior BP: 124/80 (06/06/2009)  Labs Reviewed: K+: 4.2 (06/06/2009) Creat: : 0.8 (06/06/2009)     Problem # 2:  HEPATITIS C WITHOUT HEPATIC COMA (ICD-070.51)  Orders: Hepatitis C Clinic Referral (HepC)  Problem # 3:  ASTHMA (ICD-493.90) Assessment: Unchanged  Her updated medication list for this problem includes:    Proair Hfa 108 (90 Base) Mcg/act Aers  (Albuterol sulfate) .[name]... 2 puffs qid as needed wheezing  Problem # 4:  DEPRESSION (ICD-311) Assessment: Unchanged  Her updated medication list for this problem includes:    Alprazolam 0.5 Mg Tabs (Alprazolam) .[name]... Take 1 tablet by mouth three times a day    Celexa 40 Mg Tabs (Citalopram hydrobromide) .[name]... 1 1/2 once daily  Complete Medication List: 1)  Gabapentin 300 Mg Caps (Gabapentin) .... Take 1 tablet by mouth three times a day 2)  Ibuprofen 800 Mg Tabs (Ibuprofen) .... Take 1 tablet by mouth three times a day 3)  Promethazine Hcl 25 Mg Tabs (Promethazine hcl) .[name].. 1 every 6hrs as needed 4)  Alprazolam 0.5 Mg Tabs (Alprazolam) .... Take 1 tablet by mouth three times a day 5)  Celexa 40 Mg Tabs (Citalopram hydrobromide) .[name].. 1 1/2 once daily 6)  Zyprexa 20 Mg Tabs (Olanzapine) .... Take 1 tablet by mouth once a day 7)  Proair Hfa 108 (90 Base) Mcg/act Aers (Albuterol sulfate) .... 2 puffs qid as needed wheezing 8)  Triamcinolone Acetonide 0.1 % Oint (Triamcinolone acetonide) .... Apply to aa three times a day as needed 9)  Diovan 160 Mg Tabs (Valsartan) .... One by mouth once daily for high blood pressure  Patient Instructions: 1)  Please schedule a follow-up appointment in 1 month. 2)  Tobacco is very bad for your health and your loved ones! You Should stop smoking!. 3)  Stop Smoking Tips: Choose a Quit date. Cut down before the Quit date. decide what you will do as a substitute when you feel the urge to smoke(gum,toothpick,exercise). 4)  It is important that you exercise regularly at least 20 minutes 5 times a week. If you develop chest pain, have severe difficulty breathing, or feel very tired , stop exercising immediately and seek medical attention. 5)  You need to lose weight. Consider a lower calorie diet and regular exercise.  6)  Check your Blood Pressure regularly. If it is above 140/90: you should make an appointment. Prescriptions: PROAIR HFA 108 (90 BASE) MCG/ACT AERS  (ALBUTEROL SULFATE) 2 puffs QID as needed wheezing  #1 inh x 11   Entered and Authorized by:   [name] MD   Signed by:   [name] MD on 03/30/2010   Method used:   Print then Give to Patient   RxID:   [id number] ALPRAZOLAM 0.5 MG TABS (ALPRAZOLAM) Take 1 tablet by mouth three times a day  #50 x 2   Entered and Authorized by:   [name] MD   Signed by:   [name] MD on 03/30/2010   Method used:   Print then Give to Patient   RxID:  [id number] IBUPROFEN 800 MG TABS (IBUPROFEN) Take 1 tablet by mouth three times a day  #50 x 2   Entered and Authorized by:   [name] MD   Signed by:   [name] MD on 03/30/2010   Method used:   Print then Give to Patient   RxID:   [id number] DIOVAN 160 MG TABS (VALSARTAN) One by mouth once daily for high blood pressure  #84 x 0   Entered and Authorized by:   [name] MD   Signed by:   [name] MD on 03/30/2010   Method used:   Samples Given   RxID:   [id number]

## 2010-11-03 NOTE — Letter (Signed)
Summary: Results Follow-up Letter  [facility]   [place], [place] [address]   Phone: [phone]  Fax: [phone]    05/20/2010  [place], [place]  [id number]  Dear Ms. Steckman,   The following are the results of your recent test(s):  Test     Result     Liver       abnormal function and low protein level, this is          getting serious Thyroid     borderline underactive status   _________________________________________________________  Please call for an appointment in 2-3 weeks _________________________________________________________ _________________________________________________________ _________________________________________________________  Sincerely,  [name] MD [facility]

## 2010-11-03 NOTE — Consult Note (Signed)
Summary: [facility]  [facility]   Imported By: Bubba Hales 07/31/2010 08:27:35  _____________________________________________________________________  External Attachment:    Type:   Image     Comment:   External Document

## 2010-11-03 NOTE — Assessment & Plan Note (Signed)
Summary: 2 mo rov /nws   Vital Signs:  Patient profile:   55 year old female Menstrual status:  postmenopausal Height:      64 inches Weight:      163 pounds BMI:     28.08 O2 Sat:      96 % on Room air Temp:     98.2 degrees F oral Pulse rate:   84 / minute Pulse rhythm:   regular Resp:     16 per minute BP sitting:   116 / 78  (left arm) Cuff size:   large  Vitals Entered By: Estell Harpin CMA (August 06, 2010 1:06 PM)  Nutrition Counseling: Patient's BMI is greater than 25 and therefore counseled on weight management options.  O2 Flow:  Room air CC: follow-up visit  Is Patient Diabetic? No Pain Assessment Patient in pain? no       Does patient need assistance? Functional Status Self care Ambulation Normal     Menstrual Status postmenopausal Last PAP Result done   Primary Care Provider:  [name] MD  CC:  follow-up visit .  History of Present Illness:  Follow-Up Visit      This is a 55 year old woman who presents for Follow-up visit.  The patient denies chest pain, palpitations, dizziness, syncope, edema, SOB, DOE, PND, and orthopnea.  Since the last visit the patient notes no new problems or concerns.  The patient reports taking meds as prescribed, monitoring BP, and dietary compliance.  When questioned about possible medication side effects, the patient notes none.    Preventive Screening-Counseling & Management  Alcohol-Tobacco     Alcohol drinks/day: 0     Alcohol Counseling: not indicated; patient does not drink     Smoking Status: current     Smoking Cessation Counseling: yes     Smoke Cessation Stage: precontemplative     Year Started: 1975     Year Quit: 2011     Pack years: 30     Tobacco Counseling: to quit use of tobacco products  Hep-HIV-STD-Contraception     Hepatitis Risk: risk noted     Hepatitis Risk Counseling: to avoid increased hepatitis risk     HIV Risk: no risk noted     STD Risk: no risk noted      Sexual History:   currently monogamous.        Drug Use:  never.        Blood Transfusions:  no.    Clinical Review Panels:  Prevention   Last Mammogram:  done (10/04/2008)   Last Pap Smear:  done (10/23/2008)  Immunizations   Last Tetanus Booster:  Tdap (08/06/2010)   Last Flu Vaccine:  Fluvax 3+ (08/06/2010)  Diabetes Management   HgBA1C:  6.4 (06/06/2009)   Creatinine:  0.8 (06/06/2009)   Last Flu Vaccine:  Fluvax 3+ (08/06/2010)  CBC   WBC:  5.8 (06/06/2009)   RBC:  4.78 (06/06/2009)   Hgb:  15.0 (06/06/2009)   Hct:  44.8 (06/06/2009)   Platelets:  172.0 (06/06/2009)   MCV  93.8 (06/06/2009)   MCHC  33.4 (06/06/2009)   RDW  12.1 (06/06/2009)   PMN:  54.5 (06/06/2009)   Lymphs:  33.8 (06/06/2009)   Monos:  8.2 (06/06/2009)   Eosinophils:  3.0 (06/06/2009)   Basophil:  0.5 (06/06/2009)  Complete Metabolic Panel   Glucose:  62 (06/06/2009)   Sodium:  138 (06/06/2009)   Potassium:  4.2 (06/06/2009)   Chloride:  102 (  06/06/2009)   CO2:  32 (06/06/2009)   BUN:  10 (06/06/2009)   Creatinine:  0.8 (06/06/2009)   Albumin:  3.1 (05/20/2010)   Total Protein:  6.5 (05/20/2010)   Calcium:  9.3 (06/06/2009)   Total Bili:  0.6 (05/20/2010)   Alk Phos:  79 (05/20/2010)   SGPT (ALT):  92 (05/20/2010)   SGOT (AST):  117 (05/20/2010)   Medications Prior to Update: 1)  Gabapentin 300 Mg Caps (Gabapentin) .... Take 1 Tablet By Mouth Three Times A Day 2)  Meclizine Hcl 25 Mg Tabs (Meclizine Hcl) .[name].. 1 Tablet Every 6 Hours As Needed 3)  Alprazolam 0.5 Mg Tabs (Alprazolam) .... Take 1 Tablet By Mouth Three Times A Day 4)  Celexa 40 Mg Tabs (Citalopram Hydrobromide) .[name].. 1 1/2 Once Daily 5)  Proair Hfa 108 (90 Base) Mcg/act Aers (Albuterol Sulfate) .... 2 Puffs Qid As Needed Wheezing 6)  Triamcinolone Acetonide 0.1 % Oint (Triamcinolone Acetonide) .... Apply To Aa Three Times A Day As Needed 7)  Tylenol With Codeine #3 300-30 Mg Tabs (Acetaminophen-Codeine) .... Take 1-2 Tabs Every 4-6 Hours As  Needed 8)  Synthroid 25 Mcg Tabs (Levothyroxine Sodium) .... One By Mouth Once Daily For Thyroid 9)  Diovan Hct 160-12.5 Mg Tabs (Valsartan-Hydrochlorothiazide) .... One By Mouth Once Daily For High Blood Pressure  Current Medications (verified): 1)  Gabapentin 300 Mg Caps (Gabapentin) .... Take 1 Tablet By Mouth Three Times A Day 2)  Alprazolam 0.5 Mg Tabs (Alprazolam) .... Take 1 Tablet By Mouth Three Times A Day 3)  Celexa 40 Mg Tabs (Citalopram Hydrobromide) .[name].. 1 1/2 Once Daily 4)  Proair Hfa 108 (90 Base) Mcg/act Aers (Albuterol Sulfate) .... 2 Puffs Qid As Needed Wheezing 5)  Triamcinolone Acetonide 0.1 % Oint (Triamcinolone Acetonide) .... Apply To Aa Three Times A Day As Needed 6)  Tylenol With Codeine #3 300-30 Mg Tabs (Acetaminophen-Codeine) .... Take 1-2 Tabs Every 4-6 Hours As Needed 7)  Synthroid 25 Mcg Tabs (Levothyroxine Sodium) .... One By Mouth Once Daily For Thyroid 8)  Diovan Hct 160-12.5 Mg Tabs (Valsartan-Hydrochlorothiazide) .... One By Mouth Once Daily For High Blood Pressure 9)  Ibuprofen 600 Mg Tabs (Ibuprofen) .... One By Mouth Three Times A Day With Food As Needed For Low Back Pain  Allergies (verified): 1)  ! Demerol 2)  ! Naprosyn  Past History:  Past Medical History: Last updated: 06/06/2009 Asthma Depression GERD Hypertension Osteoporosis Eczema  Past Surgical History: Last updated: 06/06/2009 Cholecystectomy Lumpectomy Tubal ligation Tonsillectomy Lumbar laminectomy  Family History: Last updated: 06/06/2009 COPD-father Family History Hypertension  Social History: Last updated: 06/06/2009 Married Alcohol use-no Drug use-no Regular exercise-no Disabled  Risk Factors: Alcohol Use: 0 (08/06/2010) Exercise: no (06/06/2009)  Risk Factors: Smoking Status: current (08/06/2010)  Family History: Reviewed history from 06/06/2009 and no changes required. COPD-father Family History Hypertension  Social History: Reviewed history from  06/06/2009 and no changes required. Married Alcohol use-no Drug use-no Regular exercise-no Disabled  Review of Systems  The patient denies anorexia, fever, weight loss, weight gain, chest pain, syncope, dyspnea on exertion, peripheral edema, prolonged cough, headaches, hemoptysis, abdominal pain, hematuria, muscle weakness, suspicious skin lesions, transient blindness, difficulty walking, and depression.   GI:  Denies abdominal pain, bloody stools, dark tarry stools, diarrhea, indigestion, loss of appetite, nausea, vomiting, vomiting blood, and yellowish skin color. MS:  Complains of low back pain; denies joint pain, joint redness, joint swelling, loss of strength, muscle aches, muscle, cramps, muscle weakness, and stiffness. Psych:  Complains of anxiety and easily tearful; denies depression, easily angered, irritability, mental problems, panic attacks, sense of great danger, suicidal thoughts/plans, thoughts of violence, unusual visions or sounds, and thoughts /plans of harming others. Endo:  Denies cold intolerance, excessive hunger, excessive thirst, excessive urination, heat intolerance, polyuria, and weight change.  Physical Exam  General:  alert, well-developed, well-nourished, well-hydrated, appropriate dress, normal appearance, healthy-appearing, and cooperative to examination.   Head:  normocephalic and atraumatic.   Eyes:  no icterus, pink/moist mm., Mouth:  Oral mucosa and oropharynx without lesions or exudates.  Teeth in good repair. Neck:  No deformities, masses, or tenderness noted. Lungs:  Normal respiratory effort, chest expands symmetrically. Lungs are clear to auscultation, no crackles or wheezes. Heart:  Normal rate and regular rhythm. S1 and S2 normal without gallop, murmur, click, rub or other extra sounds. Abdomen:  Bowel sounds positive,abdomen soft and non-tender without masses, organomegaly or hernias noted.abdominal scar(s).   Msk:  normal ROM, no joint tenderness,  no joint swelling, and no joint warmth.   Pulses:  R and L carotid,radial,femoral,dorsalis pedis and posterior tibial pulses are full and equal bilaterally Extremities:  2+ left pedal edema and 2+ right pedal edema.   Neurologic:  alert & oriented X3, cranial nerves II-XII intact, strength normal in all extremities, sensation intact to light touch, gait normal, and DTRs symmetrical and normal.   Skin:  turgor normal, color normal, no rashes, no suspicious lesions, no ecchymoses, no petechiae, no purpura, no ulcerations, and no edema.   Cervical Nodes:  no anterior cervical adenopathy and no posterior cervical adenopathy.   Inguinal Nodes:  no R inguinal adenopathy and no L inguinal adenopathy.   Psych:  Cognition and judgment appear intact. Alert and cooperative with normal attention span and concentration. No apparent delusions, illusions, hallucinations   Detailed Back/Spine Exam  General:    Well-developed, well-nourished, in no acute distress; alert and oriented x 3.    Gait:    Normal heel-toe gait pattern bilaterally.    Lumbosacral Exam:  Inspection-deformity:    Normal Palpation-spinal tenderness:  Normal Range of Motion:    Forward Flexion:   60 degrees    Hyperextension:   25 degrees    Schober's:        >6 cm    Right Lateral Bend:   25 degrees    Left Lateral Bend:   25 degrees Squatting:  normal Lying Straight Leg Raise:    Right:  negative    Left:  negative Sitting Straight Leg Raise:    Right:  negative    Left:  negative Reverse Straight Leg Raise:    Right:  negative    Left:  negative Contralateral Straight Leg Raise:    Right:  negative    Left:  negative Sciatic Notch:    There is no sciatic notch tenderness. Toe Walking:    Right:  normal    Left:  normal Heel Walking:    Right:  normal    Left:  normal   Impression & Recommendations:  Problem # 1:  BACK PAIN (ICD-724.5) Assessment New  Her updated medication list for this problem  includes:    Tylenol With Codeine #3 300-30 Mg Tabs (Acetaminophen-codeine) .[name]... Take 1-2 tabs every 4-6 hours as needed    Ibuprofen 600 Mg Tabs (Ibuprofen) ..... One by mouth three times a day with food as needed for low back pain  Problem # 2:  UNSPECIFIED HYPOTHYROIDISM (ICD-244.9) Assessment: Unchanged  Her updated medication  list for this problem includes:    Synthroid 25 Mcg Tabs (Levothyroxine sodium) ..... One by mouth once daily for thyroid  Orders: Venipuncture (06269) TLB-BMP (Basic Metabolic Panel-BMET) (48546-EVOJJKK) TLB-CBC Platelet - w/Differential (85025-CBCD) TLB-Hepatic/Liver Function Pnl (80076-HEPATIC) TLB-TSH (Thyroid Stimulating Hormone) (84443-TSH)  Problem # 3:  TOBACCO USE (ICD-305.1) Assessment: Unchanged  Encouraged smoking cessation and discussed different methods for smoking cessation.   Orders: Tobacco use cessation intermediate 3-10 minutes (93818)  Problem # 4:  HYPERTENSION (ICD-401.9) Assessment: Unchanged  Her updated medication list for this problem includes:    Diovan Hct 160-12.5 Mg Tabs (Valsartan-hydrochlorothiazide) ..... One by mouth once daily for high blood pressure  Orders: Venipuncture (29937) TLB-BMP (Basic Metabolic Panel-BMET) (16967-ELFYBOF) TLB-CBC Platelet - w/Differential (85025-CBCD) TLB-Hepatic/Liver Function Pnl (80076-HEPATIC) TLB-TSH (Thyroid Stimulating Hormone) (84443-TSH) Tobacco use cessation intermediate 3-10 minutes (99406)  BP today: 116/78 Prior BP: 154/76 (06/05/2010)  Prior 10 Yr Risk Heart Disease: Not enough information (06/05/2010)  Labs Reviewed: K+: 4.2 (06/06/2009) Creat: : 0.8 (06/06/2009)     Problem # 5:  HEPATITIS C WITHOUT HEPATIC COMA (ICD-070.51) Assessment: Unchanged  Orders: Venipuncture (75102) TLB-BMP (Basic Metabolic Panel-BMET) (58527-POEUMPN) TLB-CBC Platelet - w/Differential (85025-CBCD) TLB-Hepatic/Liver Function Pnl (80076-HEPATIC) TLB-TSH (Thyroid Stimulating Hormone)  (84443-TSH)  Complete Medication List: 1)  Gabapentin 300 Mg Caps (Gabapentin) .... Take 1 tablet by mouth three times a day 2)  Alprazolam 0.5 Mg Tabs (Alprazolam) .... Take 1 tablet by mouth three times a day 3)  Celexa 40 Mg Tabs (Citalopram hydrobromide) .[name].. 1 1/2 once daily 4)  Proair Hfa 108 (90 Base) Mcg/act Aers (Albuterol sulfate) .... 2 puffs qid as needed wheezing 5)  Triamcinolone Acetonide 0.1 % Oint (Triamcinolone acetonide) .... Apply to aa three times a day as needed 6)  Tylenol With Codeine #3 300-30 Mg Tabs (Acetaminophen-codeine) .... Take 1-2 tabs every 4-6 hours as needed 7)  Synthroid 25 Mcg Tabs (Levothyroxine sodium) .... One by mouth once daily for thyroid 8)  Diovan Hct 160-12.5 Mg Tabs (Valsartan-hydrochlorothiazide) .... One by mouth once daily for high blood pressure 9)  Ibuprofen 600 Mg Tabs (Ibuprofen) .... One by mouth three times a day with food as needed for low back pain  Other Orders: Radiology Referral (Radiology) Flu Vaccine 15yr + MEDICARE PATIENTS ((T6144 Administration Flu vaccine - MCR (G0008) Tdap => 739yrIM (9(31540Admin 1st Vaccine (9(08676 PAP Screening:    Hx Cervical Dysplasia in last 5 yrs? No    3 normal PAP smears in last 5 yrs? Yes    Last PAP smear:  10/23/2008    Reviewed PAP smear recommendations:  patient refuses understanding risks of delayed diagnosis  Mammogram Screening:    Last Mammogram:  10/04/2008    Reviewed Mammogram recommendations:  mammogram ordered  Osteoporosis Risk Assessment:  Risk Factors for Fracture or Low Bone Density:   Smoking status:       current   Thyroid replacement:     yes   Thyroid disease:     yes  Immunization & Chemoprophylaxis:    Tetanus vaccine: Tdap  (08/06/2010)    Influenza vaccine: Fluvax 3+  (08/06/2010)  Patient Instructions: 1)  Please schedule a follow-up appointment in 4 months. 2)  Tobacco is very bad for your health and your loved ones! You Should stop smoking!. 3)  Stop  Smoking Tips: Choose a Quit date. Cut down before the Quit date. decide what you will do as a substitute when you feel the urge to smoke(gum,toothpick,exercise). 4)  It is  important that you exercise regularly at least 20 minutes 5 times a week. If you develop chest pain, have severe difficulty breathing, or feel very tired , stop exercising immediately and seek medical attention. 5)  Check your Blood Pressure regularly. If it is above 140/90: you should make an appointment. Prescriptions: IBUPROFEN 600 MG TABS (IBUPROFEN) One by mouth three times a day with food as needed for low back pain  #75 x 11   Entered and Authorized by:   [name] MD   Signed by:   [name] MD on 08/06/2010   Method used:   Print then Give to Patient   RxID:   [id number] DIOVAN HCT 160-12.5 MG TABS (VALSARTAN-HYDROCHLOROTHIAZIDE) One by mouth once daily for high blood pressure  #112 x 0   Entered and Authorized by:   [name] MD   Signed by:   [name] MD on 08/06/2010   Method used:   Samples Given   RxID:   [id number] ALPRAZOLAM 0.5 MG TABS (ALPRAZOLAM) Take 1 tablet by mouth three times a day  #50 x 3   Entered and Authorized by:   [name] MD   Signed by:   [name] MD on 08/06/2010   Method used:   Print then Give to Patient   RxID:   [id number]    Orders Added: 1)  Venipuncture [09470] 2)  TLB-BMP (Basic Metabolic Panel-BMET) [96283-MOQHUTM] 3)  TLB-CBC Platelet - w/Differential [85025-CBCD] 4)  TLB-Hepatic/Liver Function Pnl [80076-HEPATIC] 5)  TLB-TSH (Thyroid Stimulating Hormone) [84443-TSH] 6)  Est. Patient Level IV [54650] 7)  Tobacco use cessation intermediate 3-10 minutes [99406] 8)  Radiology Referral [Radiology] 9)  Flu Vaccine 32yr + MEDICARE PATIENTS [Q2039] 10)  Administration Flu vaccine - MCR [[P5465] 68  Tdap => 766yrIM [9[12751]2)  Admin 1st Vaccine [9[70017] Immunizations Administered:  Tetanus Vaccine:    Vaccine Type:  Tdap    Site: right deltoid    Mfr: GlaxoSmithKline    Dose: 0.5 ml    Route: IM    Given by: LaEstell HarpinMA    Exp. Date: 07/23/2012    Lot #: [id number]  VIS given: 08/21/08 version given August 06, 2010.   Immunizations Administered:  Tetanus Vaccine:    Vaccine Type: Tdap    Site: right deltoid    Mfr: GlaxoSmithKline    Dose: 0.5 ml    Route: IM    Given by: LaEstell HarpinMA    Exp. Date: 07/23/2012    Lot #: [id number]  VIS given: 08/21/08 version given August 06, 2010.   [email or web address] Flu Vaccine Consent Questions     Do you have a history of severe allergic reactions to this vaccine? no    Any prior history of allergic reactions to egg and/or gelatin? no    Do you have a sensitivity to the preservative Thimersol? no    Do you have a past history of Guillan-Barre Syndrome? no    Do you currently have an acute febrile illness? no    Have you ever had a severe reaction to latex? no    Vaccine information given and explained to patient? yes    Are you currently pregnant? no    Lot Number:[id number]   Exp Date:04/03/2011   Site Given  Left Deltoid IM

## 2010-11-03 NOTE — Progress Notes (Signed)
    PAP Screening:    Hx Cervical Dysplasia in last 5 yrs? No    3 normal PAP smears in last 5 yrs? Yes    Last PAP smear:  10/23/2008  Mammogram Screening:    Last Mammogram:  08/20/2010  Mammogram Results:    Date of Exam:  08/20/2010    Results:  Normal Bilateral  Osteoporosis Risk Assessment:  Risk Factors for Fracture or Low Bone Density:   Smoking status:       current   Thyroid replacement:     yes   Thyroid disease:     yes  Immunization & Chemoprophylaxis:    Tetanus vaccine: Tdap  (08/06/2010)    Influenza vaccine: Fluvax 3+  (08/06/2010)

## 2010-11-03 NOTE — Assessment & Plan Note (Signed)
Summary: 2 MO ROV /NWS   Vital Signs:  Patient profile:   55 year old female Height:      64 inches (162.56 cm) Weight:      172.25 pounds (78.30 kg) BMI:     29.67 O2 Sat:      92 % on Room air Temp:     97.4 degrees F (36.33 degrees C) oral Pulse rate:   95 / minute Pulse rhythm:   regular Resp:     16 per minute BP sitting:   154 / 76  (left arm) Cuff size:   regular  Vitals Entered By: Ernestene Mention CMA (June 05, 2010 1:08 PM)  Nutrition Counseling: Patient's BMI is greater than 25 and therefore counseled on weight management options.  O2 Flow:  Room air CC: 2 mo rtn ov./kb, Hypertension Management Is Patient Diabetic? No Pain Assessment Patient in pain? no      Comments Patient states that she is not taking Meclizine, Gabapentin, or Tylenol with Codeine. Ernestene Mention CMA  June 05, 2010 1:08 PM    Primary Care Provider:  [name] MD  CC:  2 mo rtn ov./kb and Hypertension Management.  History of Present Illness: She returns for f/up and wants to discuss her ECHO which showed LVH but it was otherwise normal.  Hypertension History:      She complains of headache, but denies chest pain, palpitations, dyspnea with exertion, orthopnea, PND, peripheral edema, visual symptoms, neurologic problems, syncope, and side effects from treatment.  She notes no problems with any antihypertensive medication side effects.        Positive major cardiovascular risk factors include female age 54 years old or older, hypertension, and current tobacco user.  Negative major cardiovascular risk factors include no history of diabetes or hyperlipidemia and negative family history for ischemic heart disease.        Positive history for target organ damage include cardiac end organ damage (either CHF or LVH).  Further assessment for target organ damage reveals no history of ASHD, stroke/TIA, peripheral vascular disease, renal insufficiency, or hypertensive retinopathy.     Preventive  Screening-Counseling & Management  Alcohol-Tobacco     Alcohol drinks/day: 0     Smoking Status: current     Smoking Cessation Counseling: yes     Smoke Cessation Stage: precontemplative     Year Started: 1975     Year Quit: 2011     Pack years: 30     Tobacco Counseling: to quit use of tobacco products  Clinical Review Panels:  Diabetes Management   HgBA1C:  6.4 (06/06/2009)   Creatinine:  0.8 (06/06/2009)   Last Flu Vaccine:  given (10/23/2008)  CBC   WBC:  5.8 (06/06/2009)   RBC:  4.78 (06/06/2009)   Hgb:  15.0 (06/06/2009)   Hct:  44.8 (06/06/2009)   Platelets:  172.0 (06/06/2009)   MCV  93.8 (06/06/2009)   MCHC  33.4 (06/06/2009)   RDW  12.1 (06/06/2009)   PMN:  54.5 (06/06/2009)   Lymphs:  33.8 (06/06/2009)   Monos:  8.2 (06/06/2009)   Eosinophils:  3.0 (06/06/2009)   Basophil:  0.5 (06/06/2009)  Complete Metabolic Panel   Glucose:  62 (06/06/2009)   Sodium:  138 (06/06/2009)   Potassium:  4.2 (06/06/2009)   Chloride:  102 (06/06/2009)   CO2:  32 (06/06/2009)   BUN:  10 (06/06/2009)   Creatinine:  0.8 (06/06/2009)   Albumin:  3.1 (05/20/2010)   Total Protein:  6.5 (05/20/2010)   Calcium:  9.3 (06/06/2009)   Total Bili:  0.6 (05/20/2010)   Alk Phos:  79 (05/20/2010)   SGPT (ALT):  92 (05/20/2010)   SGOT (AST):  117 (05/20/2010)   Medications Prior to Update: 1)  Gabapentin 300 Mg Caps (Gabapentin) .... Take 1 Tablet By Mouth Three Times A Day 2)  Meclizine Hcl 25 Mg Tabs (Meclizine Hcl) .[name].. 1 Tablet Every 6 Hours As Needed 3)  Alprazolam 0.5 Mg Tabs (Alprazolam) .... Take 1 Tablet By Mouth Three Times A Day 4)  Celexa 40 Mg Tabs (Citalopram Hydrobromide) .[name].. 1 1/2 Once Daily 5)  Proair Hfa 108 (90 Base) Mcg/act Aers (Albuterol Sulfate) .... 2 Puffs Qid As Needed Wheezing 6)  Triamcinolone Acetonide 0.1 % Oint (Triamcinolone Acetonide) .... Apply To Aa Three Times A Day As Needed 7)  Tylenol With Codeine #3 300-30 Mg Tabs (Acetaminophen-Codeine) ....  Take 1-2 Tabs Every 4-6 Hours As Needed 8)  Hydrochlorothiazide 12.5 Mg Caps (Hydrochlorothiazide) .... One By Mouth Qam For Edema  Current Medications (verified): 1)  Gabapentin 300 Mg Caps (Gabapentin) .... Take 1 Tablet By Mouth Three Times A Day 2)  Meclizine Hcl 25 Mg Tabs (Meclizine Hcl) .[name].. 1 Tablet Every 6 Hours As Needed 3)  Alprazolam 0.5 Mg Tabs (Alprazolam) .... Take 1 Tablet By Mouth Three Times A Day 4)  Celexa 40 Mg Tabs (Citalopram Hydrobromide) .[name].. 1 1/2 Once Daily 5)  Proair Hfa 108 (90 Base) Mcg/act Aers (Albuterol Sulfate) .... 2 Puffs Qid As Needed Wheezing 6)  Triamcinolone Acetonide 0.1 % Oint (Triamcinolone Acetonide) .... Apply To Aa Three Times A Day As Needed 7)  Tylenol With Codeine #3 300-30 Mg Tabs (Acetaminophen-Codeine) .... Take 1-2 Tabs Every 4-6 Hours As Needed 8)  Synthroid 25 Mcg Tabs (Levothyroxine Sodium) .... One By Mouth Once Daily For Thyroid 9)  Diovan Hct 160-12.5 Mg Tabs (Valsartan-Hydrochlorothiazide) .... One By Mouth Once Daily For High Blood Pressure  Allergies (verified): 1)  ! Demerol 2)  ! Naprosyn  Past History:  Past Medical History: Last updated: 06/06/2009 Asthma Depression GERD Hypertension Osteoporosis Eczema  Past Surgical History: Last updated: 06/06/2009 Cholecystectomy Lumpectomy Tubal ligation Tonsillectomy Lumbar laminectomy  Family History: Last updated: 06/06/2009 COPD-father Family History Hypertension  Social History: Last updated: 06/06/2009 Married Alcohol use-no Drug use-no Regular exercise-no Disabled  Risk Factors: Alcohol Use: 0 (06/05/2010) Exercise: no (06/06/2009)  Risk Factors: Smoking Status: current (06/05/2010)  Family History: Reviewed history from 06/06/2009 and no changes required. COPD-father Family History Hypertension  Social History: Reviewed history from 06/06/2009 and no changes required. Married Alcohol use-no Drug use-no Regular  exercise-no Disabled Smoking Status:  current  Review of Systems       The patient complains of weight gain.  The patient denies anorexia, fever, chest pain, syncope, dyspnea on exertion, peripheral edema, prolonged cough, headaches, hemoptysis, abdominal pain, hematuria, suspicious skin lesions, and depression.   GI:  Denies abdominal pain, constipation, diarrhea, indigestion, loss of appetite, nausea, vomiting, vomiting blood, and yellowish skin color. Psych:  Complains of anxiety, easily angered, and irritability; denies alternate hallucination ( auditory/visual), depression, easily tearful, mental problems, panic attacks, sense of great danger, suicidal thoughts/plans, thoughts of violence, unusual visions or sounds, and thoughts /plans of harming others. Endo:  Complains of weight change; denies cold intolerance, excessive hunger, excessive thirst, excessive urination, heat intolerance, and polyuria.  Physical Exam  General:  alert, well-developed, well-nourished, well-hydrated, appropriate dress, normal appearance, healthy-appearing, and cooperative to examination.   Head:  normocephalic and atraumatic.   Mouth:  Oral mucosa and oropharynx without lesions or exudates.  Teeth in good repair. Neck:  No deformities, masses, or tenderness noted. Lungs:  Normal respiratory effort, chest expands symmetrically. Lungs are clear to auscultation, no crackles or wheezes. Heart:  Normal rate and regular rhythm. S1 and S2 normal without gallop, murmur, click, rub or other extra sounds. Abdomen:  Bowel sounds positive,abdomen soft and non-tender without masses, organomegaly or hernias noted.abdominal scar(s).   Msk:  normal ROM, no joint tenderness, no joint swelling, and no joint warmth.   Pulses:  R and L carotid,radial,femoral,dorsalis pedis and posterior tibial pulses are full and equal bilaterally Extremities:  2+ left pedal edema and 2+ right pedal edema.   Neurologic:  alert & oriented X3,  cranial nerves II-XII intact, strength normal in all extremities, sensation intact to light touch, gait normal, and DTRs symmetrical and normal.   Skin:  turgor normal, color normal, no rashes, no suspicious lesions, no ecchymoses, no petechiae, no purpura, no ulcerations, and no edema.   Cervical Nodes:  no anterior cervical adenopathy and no posterior cervical adenopathy.   Axillary Nodes:  no R axillary adenopathy and no L axillary adenopathy.   Psych:  Cognition and judgment appear intact. Alert and cooperative with normal attention span and concentration. No apparent delusions, illusions, hallucinations   Impression & Recommendations:  Problem # 1:  TOBACCO USE (ICD-305.1) Assessment Deteriorated  Encouraged smoking cessation and discussed different methods for smoking cessation.   Orders: Tobacco use cessation intermediate 3-10 minutes (20254)  Problem # 2:  UNSPECIFIED HYPOTHYROIDISM (ICD-244.9) Assessment: New  Her updated medication list for this problem includes:    Synthroid 25 Mcg Tabs (Levothyroxine sodium) ..... One by mouth once daily for thyroid  Labs Reviewed: TSH: 4.97 (05/20/2010)    HgBA1c: 6.4 (06/06/2009)  Problem # 3:  EDEMA (ICD-782.3) Assessment: Improved  The following medications were removed from the medication list:    Hydrochlorothiazide 12.5 Mg Caps (Hydrochlorothiazide) ..... One by mouth qam for edema Her updated medication list for this problem includes:    Diovan Hct 160-12.5 Mg Tabs (Valsartan-hydrochlorothiazide) ..... One by mouth once daily for high blood pressure  Problem # 4:  HYPERTENSION (ICD-401.9) Assessment: Deteriorated  The following medications were removed from the medication list:    Hydrochlorothiazide 12.5 Mg Caps (Hydrochlorothiazide) ..... One by mouth qam for edema Her updated medication list for this problem includes:    Diovan Hct 160-12.5 Mg Tabs (Valsartan-hydrochlorothiazide) ..... One by mouth once daily for high  blood pressure  BP today: 154/76 Prior BP: 106/68 (05/20/2010)  10 Yr Risk Heart Disease: Not enough information  Labs Reviewed: K+: 4.2 (06/06/2009) Creat: : 0.8 (06/06/2009)     Orders: Tobacco use cessation intermediate 3-10 minutes (99406)  Problem # 5:  HEPATITIS C WITHOUT HEPATIC COMA (ICD-070.51) Assessment: Unchanged she is scheduled to be seen at the Hep C clinic  Problem # 6:  DEPRESSION (ICD-311) Assessment: Unchanged  Her updated medication list for this problem includes:    Alprazolam 0.5 Mg Tabs (Alprazolam) .[name]... Take 1 tablet by mouth three times a day    Celexa 40 Mg Tabs (Citalopram hydrobromide) .[name]... 1 1/2 once daily  Complete Medication List: 1)  Gabapentin 300 Mg Caps (Gabapentin) .... Take 1 tablet by mouth three times a day 2)  Meclizine Hcl 25 Mg Tabs (Meclizine hcl) .[name].. 1 tablet every 6 hours as needed 3)  Alprazolam 0.5 Mg Tabs (Alprazolam) .... Take 1 tablet by  mouth three times a day 4)  Celexa 40 Mg Tabs (Citalopram hydrobromide) .[name].. 1 1/2 once daily 5)  Proair Hfa 108 (90 Base) Mcg/act Aers (Albuterol sulfate) .... 2 puffs qid as needed wheezing 6)  Triamcinolone Acetonide 0.1 % Oint (Triamcinolone acetonide) .... Apply to aa three times a day as needed 7)  Tylenol With Codeine #3 300-30 Mg Tabs (Acetaminophen-codeine) .... Take 1-2 tabs every 4-6 hours as needed 8)  Synthroid 25 Mcg Tabs (Levothyroxine sodium) .... One by mouth once daily for thyroid 9)  Diovan Hct 160-12.5 Mg Tabs (Valsartan-hydrochlorothiazide) .... One by mouth once daily for high blood pressure  Hypertension Assessment/Plan:      The patient's hypertensive risk group is category C: Target organ damage and/or diabetes.  Today's blood pressure is 154/76.  Her blood pressure goal is < 140/90.  Patient Instructions: 1)  Please schedule a follow-up appointment in 2 months. 2)  Limit your Sodium (Salt) to less than 4 grams a day (slightly less than 1 teaspoon) to prevent fluid  retention, swelling, or worsening or symptoms. 3)  Tobacco is very bad for your health and your loved ones! You Should stop smoking!. 4)  Stop Smoking Tips: Choose a Quit date. Cut down before the Quit date. decide what you will do as a substitute when you feel the urge to smoke(gum,toothpick,exercise). 5)  It is important that you exercise regularly at least 20 minutes 5 times a week. If you develop chest pain, have severe difficulty breathing, or feel very tired , stop exercising immediately and seek medical attention. 6)  You need to lose weight. Consider a lower calorie diet and regular exercise.  7)  Check your Blood Pressure regularly. If it is above 130/80: you should make an appointment. Prescriptions: DIOVAN HCT 160-12.5 MG TABS (VALSARTAN-HYDROCHLOROTHIAZIDE) One by mouth once daily for high blood pressure  #84 x 0   Entered and Authorized by:   [name] MD   Signed by:   [name] MD on 06/05/2010   Method used:   Samples Given   RxID:   [id number] ALPRAZOLAM 0.5 MG TABS (ALPRAZOLAM) Take 1 tablet by mouth three times a day  #50 x 3   Entered and Authorized by:   [name] MD   Signed by:   [name] MD on 06/05/2010   Method used:   Print then Give to Patient   RxID:   [id number] SYNTHROID 25 MCG TABS (LEVOTHYROXINE SODIUM) One by mouth once daily for thyroid  #30 x 11   Entered and Authorized by:   [name] MD   Signed by:   [name] MD on 06/05/2010   Method used:   Electronically to        [id number] (retail)       [place], [place]  [id number]       Ph: [phone]       Fax: [phone]   RxID:   [id number]

## 2010-11-03 NOTE — Progress Notes (Signed)
----  Converted from flag ---- ---- 05/20/2010 9:21 AM, Lorraine Lax wrote: appt 8/29 @ 4:30  ---- 05/20/2010 8:52 AM, Ophelia Charter wrote: Thanks  ---- 05/20/2010 8:45 AM, [name] MD wrote: The following orders have been entered for this patient and placed on Admin Hold:  Type:     Referral       Code:   Echo Description:   Echo Referral Order Date:   05/20/2010   Authorized By:   [name] MD Order #:   [id number] Clinical Notes:   Type:  Special instructions: ------------------------------

## 2010-11-03 NOTE — Assessment & Plan Note (Signed)
Summary: ER FU--SWELLING LEGS--ER/LAST MONDAY---STC   Vital Signs:  Patient profile:   55 year old female Height:      64 inches Weight:      186.75 pounds BMI:     32.17 O2 Sat:      98 % on Room air Temp:     98.0 degrees F oral Pulse rate:   77 / minute Pulse rhythm:   regular Resp:     16 per minute BP sitting:   106 / 68  (left arm) Cuff size:   large  Vitals Entered By: Estell Harpin CMA (May 20, 2010 8:33 AM)  O2 Flow:  Room air CC: ER follow up/ pt c/o Bilateral ankle swelling and dizziness Is Patient Diabetic? No Pain Assessment Patient in pain? no       Does patient need assistance? Functional Status Self care Ambulation Normal   Primary Care Provider:  [name] MD  CC:  ER follow up/ pt c/o Bilateral ankle swelling and dizziness.  History of Present Illness: She returns for f/up and she informs me that she had an episode of vertigo 1 week ago and she went to the ER- her results are reviewed and they show a normal CTscan of the brain, normal labs, normal EKG, and normal Chest Xray. She is concerned today about worsening edema in her legs and she wants her liver checked again (no LFT's were done in the ER).  Preventive Screening-Counseling & Management  Alcohol-Tobacco     Alcohol drinks/day: 0     Smoking Status: quit < 6 months     Smoking Cessation Counseling: yes     Smoke Cessation Stage: quit     Year Started: 1975     Year Quit: 2011     Pack years: 30     Tobacco Counseling: to remain off tobacco products  Hep-HIV-STD-Contraception     Hepatitis Risk: risk noted     Hepatitis Risk Counseling: to avoid increased hepatitis risk     HIV Risk: no risk noted     STD Risk: no risk noted      Sexual History:  currently monogamous.        Drug Use:  never.        Blood Transfusions:  no.    Medications Prior to Update: 1)  Gabapentin 300 Mg Caps (Gabapentin) .... Take 1 Tablet By Mouth Three Times A Day 2)  Ibuprofen 800 Mg Tabs  (Ibuprofen) .... Take 1 Tablet By Mouth Three Times A Day 3)  Promethazine Hcl 25 Mg Tabs (Promethazine Hcl) .[name].. 1 Every 6hrs As Needed 4)  Alprazolam 0.5 Mg Tabs (Alprazolam) .... Take 1 Tablet By Mouth Three Times A Day 5)  Celexa 40 Mg Tabs (Citalopram Hydrobromide) .[name].. 1 1/2 Once Daily 6)  Zyprexa 20 Mg Tabs (Olanzapine) .... Take 1 Tablet By Mouth Once A Day 7)  Proair Hfa 108 (90 Base) Mcg/act Aers (Albuterol Sulfate) .... 2 Puffs Qid As Needed Wheezing 8)  Triamcinolone Acetonide 0.1 % Oint (Triamcinolone Acetonide) .... Apply To Aa Three Times A Day As Needed 9)  Diovan 160 Mg Tabs (Valsartan) .... One By Mouth Once Daily For High Blood Pressure  Current Medications (verified): 1)  Gabapentin 300 Mg Caps (Gabapentin) .... Take 1 Tablet By Mouth Three Times A Day 2)  Meclizine Hcl 25 Mg Tabs (Meclizine Hcl) .[name].. 1 Tablet Every 6 Hours As Needed 3)  Alprazolam 0.5 Mg Tabs (Alprazolam) .... Take 1 Tablet By Mouth  Three Times A Day 4)  Celexa 40 Mg Tabs (Citalopram Hydrobromide) .[name].. 1 1/2 Once Daily 5)  Proair Hfa 108 (90 Base) Mcg/act Aers (Albuterol Sulfate) .... 2 Puffs Qid As Needed Wheezing 6)  Triamcinolone Acetonide 0.1 % Oint (Triamcinolone Acetonide) .... Apply To Aa Three Times A Day As Needed 7)  Tylenol With Codeine #3 300-30 Mg Tabs (Acetaminophen-Codeine) .... Take 1-2 Tabs Every 4-6 Hours As Needed 8)  Hydrochlorothiazide 12.5 Mg Caps (Hydrochlorothiazide) .... One By Mouth Qam For Edema  Allergies (verified): 1)  ! Demerol 2)  ! Naprosyn  Past History:  Past Medical History: Last updated: 06/06/2009 Asthma Depression GERD Hypertension Osteoporosis Eczema  Past Surgical History: Last updated: 06/06/2009 Cholecystectomy Lumpectomy Tubal ligation Tonsillectomy Lumbar laminectomy  Family History: Last updated: 06/06/2009 COPD-father Family History Hypertension  Social History: Last updated: 06/06/2009 Married Alcohol use-no Drug use-no Regular  exercise-no Disabled  Risk Factors: Alcohol Use: 0 (05/20/2010) Exercise: no (06/06/2009)  Risk Factors: Smoking Status: quit < 6 months (05/20/2010)  Family History: Reviewed history from 06/06/2009 and no changes required. COPD-father Family History Hypertension  Social History: Reviewed history from 06/06/2009 and no changes required. Married Alcohol use-no Drug use-no Regular exercise-no Disabled  Review of Systems       The patient complains of peripheral edema.  The patient denies anorexia, fever, weight loss, weight gain, chest pain, syncope, dyspnea on exertion, prolonged cough, headaches, hemoptysis, abdominal pain, melena, hematochezia, severe indigestion/heartburn, hematuria, difficulty walking, depression, abnormal bleeding, enlarged lymph nodes, and angioedema.   GI:  Denies abdominal pain, bloody stools, change in bowel habits, constipation, diarrhea, excessive appetite, hemorrhoids, indigestion, loss of appetite, nausea, vomiting, vomiting blood, and yellowish skin color. Endo:  Denies cold intolerance, excessive hunger, excessive thirst, excessive urination, heat intolerance, polyuria, and weight change.  Physical Exam  General:  alert, well-developed, well-nourished, well-hydrated, appropriate dress, normal appearance, healthy-appearing, and cooperative to examination.   Head:  normocephalic and atraumatic.   Eyes:  no icterus, pink/moist mm., Mouth:  Oral mucosa and oropharynx without lesions or exudates.  Teeth in good repair. Neck:  No deformities, masses, or tenderness noted. Lungs:  Normal respiratory effort, chest expands symmetrically. Lungs are clear to auscultation, no crackles or wheezes. Heart:  Normal rate and regular rhythm. S1 and S2 normal without gallop, murmur, click, rub or other extra sounds. Abdomen:  Bowel sounds positive,abdomen soft and non-tender without masses, organomegaly or hernias noted.abdominal scar(s).   Msk:  normal ROM, no  joint tenderness, no joint swelling, and no joint warmth.   Pulses:  R and L carotid,radial,femoral,dorsalis pedis and posterior tibial pulses are full and equal bilaterally Extremities:  2+ left pedal edema and 2+ right pedal edema.   Neurologic:  alert & oriented X3, cranial nerves II-XII intact, strength normal in all extremities, sensation intact to light touch, gait normal, and DTRs symmetrical and normal.   Skin:  turgor normal, color normal, no rashes, no suspicious lesions, no ecchymoses, no petechiae, no purpura, no ulcerations, and no edema.   Cervical Nodes:  no anterior cervical adenopathy and no posterior cervical adenopathy.   Axillary Nodes:  no R axillary adenopathy and no L axillary adenopathy.   Psych:  Cognition and judgment appear intact. Alert and cooperative with normal attention span and concentration. No apparent delusions, illusions, hallucinations   Impression & Recommendations:  Problem # 1:  EDEMA (ICD-782.3) Assessment New will stop meds that can cause edema and change BP med to a diuretic, also will check albumin level and  echocardiogram to see if the liver or heart are part of the problem Her updated medication list for this problem includes:    Hydrochlorothiazide 12.5 Mg Caps (Hydrochlorothiazide) ..... One by mouth qam for edema  Orders: TLB-Hepatic/Liver Function Pnl (80076-HEPATIC) TLB-TSH (Thyroid Stimulating Hormone) (84443-TSH) TLB-BNP (B-Natriuretic Peptide) (83880-BNPR) TLB-PT (Protime) (85610-PTP) Echo Referral (Echo)  Discussed elevation of the legs, use of compression stockings, sodium restiction, and medication use.   Problem # 2:  HEPATITIS C WITHOUT HEPATIC COMA (ICD-070.51) Assessment: Deteriorated  Orders: TLB-Hepatic/Liver Function Pnl (80076-HEPATIC) TLB-TSH (Thyroid Stimulating Hormone) (84443-TSH) TLB-BNP (B-Natriuretic Peptide) (83880-BNPR) TLB-PT (Protime) (85610-PTP)  Problem # 3:  HYPERTENSION (ICD-401.9) Assessment:  Unchanged  The following medications were removed from the medication list:    Diovan 160 Mg Tabs (Valsartan) ..... One by mouth once daily for high blood pressure Her updated medication list for this problem includes:    Hydrochlorothiazide 12.5 Mg Caps (Hydrochlorothiazide) ..... One by mouth qam for edema  Orders: TLB-Hepatic/Liver Function Pnl (80076-HEPATIC) TLB-TSH (Thyroid Stimulating Hormone) (84443-TSH) TLB-BNP (B-Natriuretic Peptide) (83880-BNPR) TLB-PT (Protime) (85610-PTP)  BP today: 106/68 Prior BP: 150/70 (03/30/2010)  Labs Reviewed: K+: 4.2 (06/06/2009) Creat: : 0.8 (06/06/2009)     Complete Medication List: 1)  Gabapentin 300 Mg Caps (Gabapentin) .... Take 1 tablet by mouth three times a day 2)  Meclizine Hcl 25 Mg Tabs (Meclizine hcl) .[name].. 1 tablet every 6 hours as needed 3)  Alprazolam 0.5 Mg Tabs (Alprazolam) .... Take 1 tablet by mouth three times a day 4)  Celexa 40 Mg Tabs (Citalopram hydrobromide) .[name].. 1 1/2 once daily 5)  Proair Hfa 108 (90 Base) Mcg/act Aers (Albuterol sulfate) .... 2 puffs qid as needed wheezing 6)  Triamcinolone Acetonide 0.1 % Oint (Triamcinolone acetonide) .... Apply to aa three times a day as needed 7)  Tylenol With Codeine #3 300-30 Mg Tabs (Acetaminophen-codeine) .... Take 1-2 tabs every 4-6 hours as needed 8)  Hydrochlorothiazide 12.5 Mg Caps (Hydrochlorothiazide) .... One by mouth qam for edema  Patient Instructions: 1)  Please schedule a follow-up appointment in 2 weeks. 2)  Limit your Sodium (Salt) to less than 2 grams a day(slightly less than 1/2 a teaspoon) to prevent fluid retention, swelling, or worsening of symptoms. 3)  Tobacco is very bad for your health and your loved ones! You Should stop smoking!. 4)  Stop Smoking Tips: Choose a Quit date. Cut down before the Quit date. decide what you will do as a substitute when you feel the urge to smoke(gum,toothpick,exercise). Prescriptions: HYDROCHLOROTHIAZIDE 12.5 MG CAPS  (HYDROCHLOROTHIAZIDE) One by mouth qam for edema  #30 x 5   Entered and Authorized by:   [name] MD   Signed by:   [name] MD on 05/20/2010   Method used:   Print then Give to Patient   RxID:   [id number]

## 2010-11-05 NOTE — Consult Note (Signed)
Summary: [facility]  [facility]   Imported By: Bubba Hales 10/16/2010 11:03:14  _____________________________________________________________________  External Attachment:    Type:   Image     Comment:   External Document

## 2010-12-07 ENCOUNTER — Other Ambulatory Visit: Payer: Medicare Other

## 2010-12-07 ENCOUNTER — Other Ambulatory Visit: Payer: Self-pay | Admitting: Internal Medicine

## 2010-12-07 ENCOUNTER — Ambulatory Visit (INDEPENDENT_AMBULATORY_CARE_PROVIDER_SITE_OTHER): Payer: Medicare Other | Admitting: Internal Medicine

## 2010-12-07 ENCOUNTER — Encounter: Payer: Self-pay | Admitting: Internal Medicine

## 2010-12-07 DIAGNOSIS — E039 Hypothyroidism, unspecified: Secondary | ICD-10-CM

## 2010-12-07 DIAGNOSIS — F329 Major depressive disorder, single episode, unspecified: Secondary | ICD-10-CM

## 2010-12-07 DIAGNOSIS — I10 Essential (primary) hypertension: Secondary | ICD-10-CM

## 2010-12-07 DIAGNOSIS — B1710 Acute hepatitis C without hepatic coma: Secondary | ICD-10-CM

## 2010-12-07 DIAGNOSIS — F17200 Nicotine dependence, unspecified, uncomplicated: Secondary | ICD-10-CM

## 2010-12-07 LAB — HEPATIC FUNCTION PANEL
AST: 75 U/L — ABNORMAL HIGH (ref 0–37)
Alkaline Phosphatase: 70 U/L (ref 39–117)
Bilirubin, Direct: 0.2 mg/dL (ref 0.0–0.3)
Total Protein: 7 g/dL (ref 6.0–8.3)

## 2010-12-07 LAB — CBC WITH DIFFERENTIAL/PLATELET
Basophils Relative: 0.4 % (ref 0.0–3.0)
Eosinophils Absolute: 0.1 10*3/uL (ref 0.0–0.7)
Lymphs Abs: 2 10*3/uL (ref 0.7–4.0)
Monocytes Absolute: 0.5 10*3/uL (ref 0.1–1.0)
Neutro Abs: 2.8 10*3/uL (ref 1.4–7.7)
WBC: 5.4 10*3/uL (ref 4.5–10.5)

## 2010-12-07 LAB — BASIC METABOLIC PANEL
BUN: 4 mg/dL — ABNORMAL LOW (ref 6–23)
CO2: 30 mEq/L (ref 19–32)
Chloride: 97 mEq/L (ref 96–112)
Creatinine, Ser: 0.6 mg/dL (ref 0.4–1.2)
GFR: 140.91 mL/min (ref >60.00)

## 2010-12-15 NOTE — Assessment & Plan Note (Signed)
Summary: 4 MO FU /  LB Bonney Leitz   Vital Signs:  Patient profile:   55 year old female Menstrual status:  postmenopausal Height:      64 inches Weight:      170.50 pounds BMI:     29.37 O2 Sat:      97 % on Room air Temp:     98.0 degrees F oral Pulse rate:   79 / minute Pulse rhythm:   regular Resp:     16 per minute BP sitting:   130 / 64  (left arm) Cuff size:   regular  Vitals Entered By: [place] (December 07, 2010 1:13 PM)  Nutrition Counseling: Patient's BMI is greater than 25 and therefore counseled on weight management options.  O2 Flow:  Room air  Primary Care Provider:  [name] MD   History of Present Illness:  Follow-Up Visit      This is a 55 year old woman who presents for Follow-up visit.  The patient denies chest pain, palpitations, dizziness, syncope, edema, SOB, DOE, PND, and orthopnea.  Since the last visit the patient notes problems with medications.  The patient reports taking meds as prescribed, monitoring BP, and dietary compliance.  When questioned about possible medication side effects, the patient notes none.    Preventive Screening-Counseling & Management  Alcohol-Tobacco     Alcohol drinks/day: 0     Alcohol Counseling: not indicated; patient does not drink     Smoking Status: current     Smoking Cessation Counseling: yes     Smoke Cessation Stage: precontemplative     Year Started: 1975     Year Quit: 2011     Pack years: 30     Tobacco Counseling: to quit use of tobacco products  Hep-HIV-STD-Contraception     Hepatitis Risk: risk noted     Hepatitis Risk Counseling: to avoid increased hepatitis risk     HIV Risk: no risk noted     STD Risk: no risk noted      Sexual History:  currently monogamous.        Drug Use:  never.        Blood Transfusions:  no.    Clinical Review Panels:  Prevention   Last Mammogram:  Normal Bilateral (08/20/2010)   Last Pap Smear:  done (10/23/2008)  Immunizations   Last Tetanus Booster:   Tdap (08/06/2010)   Last Flu Vaccine:  Fluvax 3+ (08/06/2010)  Diabetes Management   HgBA1C:  6.4 (06/06/2009)   Creatinine:  0.9 (08/06/2010)   Last Flu Vaccine:  Fluvax 3+ (08/06/2010)  CBC   WBC:  7.0 (08/06/2010)   RBC:  4.83 (08/06/2010)   Hgb:  15.3 (08/06/2010)   Hct:  44.8 (08/06/2010)   Platelets:  207.0 (08/06/2010)   MCV  92.9 (08/06/2010)   MCHC  34.1 (08/06/2010)   RDW  13.7 (08/06/2010)   PMN:  55.0 (08/06/2010)   Lymphs:  35.7 (08/06/2010)   Monos:  7.8 (08/06/2010)   Eosinophils:  1.1 (08/06/2010)   Basophil:  0.4 (08/06/2010)  Complete Metabolic Panel   Glucose:  114 (08/06/2010)   Sodium:  136 (08/06/2010)   Potassium:  4.4 (08/06/2010)   Chloride:  99 (08/06/2010)   CO2:  31 (08/06/2010)   BUN:  8 (08/06/2010)   Creatinine:  0.9 (08/06/2010)   Albumin:  3.8 (08/06/2010)   Total Protein:  7.4 (08/06/2010)   Calcium:  10.0 (08/06/2010)   Total Bili:  0.6 (08/06/2010)  Alk Phos:  65 (08/06/2010)   SGPT (ALT):  43 (08/06/2010)   SGOT (AST):  42 (08/06/2010)   Medications Prior to Update: 1)  Gabapentin 300 Mg Caps (Gabapentin) .... Take 1 Tablet By Mouth Three Times A Day 2)  Alprazolam 0.5 Mg Tabs (Alprazolam) .... Take 1 Tablet By Mouth Three Times A Day 3)  Celexa 40 Mg Tabs (Citalopram Hydrobromide) .[name].. 1 1/2 Once Daily 4)  Proair Hfa 108 (90 Base) Mcg/act Aers (Albuterol Sulfate) .... 2 Puffs Qid As Needed Wheezing 5)  Triamcinolone Acetonide 0.1 % Oint (Triamcinolone Acetonide) .... Apply To Aa Three Times A Day As Needed 6)  Tylenol With Codeine #3 300-30 Mg Tabs (Acetaminophen-Codeine) .... Take 1-2 Tabs Every 4-6 Hours As Needed 7)  Synthroid 25 Mcg Tabs (Levothyroxine Sodium) .... One By Mouth Once Daily For Thyroid 8)  Diovan Hct 160-12.5 Mg Tabs (Valsartan-Hydrochlorothiazide) .... One By Mouth Once Daily For High Blood Pressure 9)  Ibuprofen 600 Mg Tabs (Ibuprofen) .... One By Mouth Three Times A Day With Food As Needed For Low Back  Pain  Current Medications (verified): 1)  Gabapentin 300 Mg Caps (Gabapentin) .... Take 1 Tablet By Mouth Three Times A Day 2)  Alprazolam 0.5 Mg Tabs (Alprazolam) .... Take 1 Tablet By Mouth Three Times A Day 3)  Celexa 40 Mg Tabs (Citalopram Hydrobromide) .[name].. 1 1/2 Once Daily 4)  Proair Hfa 108 (90 Base) Mcg/act Aers (Albuterol Sulfate) .... 2 Puffs Qid As Needed Wheezing 5)  Triamcinolone Acetonide 0.1 % Oint (Triamcinolone Acetonide) .... Apply To Aa Three Times A Day As Needed 6)  Synthroid 25 Mcg Tabs (Levothyroxine Sodium) .... One By Mouth Once Daily For Thyroid 7)  Diovan Hct 160-12.5 Mg Tabs (Valsartan-Hydrochlorothiazide) .... One By Mouth Once Daily For High Blood Pressure 8)  Ibuprofen 600 Mg Tabs (Ibuprofen) .... One By Mouth Three Times A Day With Food As Needed For Low Back Pain  Allergies (verified): 1)  ! Demerol 2)  ! Naprosyn  Past History:  Past Medical History: Last updated: 06/06/2009 Asthma Depression GERD Hypertension Osteoporosis Eczema  Past Surgical History: Last updated: 06/06/2009 Cholecystectomy Lumpectomy Tubal ligation Tonsillectomy Lumbar laminectomy  Family History: Last updated: 06/06/2009 COPD-father Family History Hypertension  Social History: Last updated: 06/06/2009 Married Alcohol use-no Drug use-no Regular exercise-no Disabled  Risk Factors: Alcohol Use: 0 (12/07/2010) Exercise: no (06/06/2009)  Risk Factors: Smoking Status: current (12/07/2010)  Family History: Reviewed history from 06/06/2009 and no changes required. COPD-father Family History Hypertension  Social History: Reviewed history from 06/06/2009 and no changes required. Married Alcohol use-no Drug use-no Regular exercise-no Disabled  Review of Systems  The patient denies anorexia, fever, weight loss, weight gain, hoarseness, chest pain, syncope, dyspnea on exertion, peripheral edema, prolonged cough, headaches, hemoptysis, abdominal pain,  melena, hematochezia, severe indigestion/heartburn, hematuria, suspicious skin lesions, transient blindness, difficulty walking, depression, enlarged lymph nodes, and angioedema.   GI:  Denies abdominal pain, change in bowel habits, diarrhea, indigestion, loss of appetite, nausea, vomiting, vomiting blood, and yellowish skin color. Psych:  Complains of anxiety; denies alternate hallucination ( auditory/visual), depression, easily angered, easily tearful, irritability, mental problems, panic attacks, sense of great danger, suicidal thoughts/plans, thoughts of violence, unusual visions or sounds, and thoughts /plans of harming others. Endo:  Denies cold intolerance, excessive hunger, excessive thirst, excessive urination, heat intolerance, polyuria, and weight change.  Physical Exam  General:  Well-developed, well-nourished, in no acute distress; alert and oriented x 3.   Head:  normocephalic and atraumatic.  Eyes:  vision grossly intact, pupils equal, and no injection or icterus.   Ears:  R ear normal and L ear normal.   Mouth:  Oral mucosa and oropharynx without lesions or exudates.  Teeth in good repair. Neck:  No deformities, masses, or tenderness noted. Lungs:  Normal respiratory effort, chest expands symmetrically. Lungs are clear to auscultation, no crackles or wheezes. Heart:  Normal rate and regular rhythm. S1 and S2 normal without gallop, murmur, click, rub or other extra sounds. Abdomen:  Bowel sounds positive,abdomen soft and non-tender without masses, organomegaly or hernias noted.abdominal scar(s).   Msk:  normal ROM, no joint tenderness, no joint swelling, and no joint warmth.   Pulses:  R and L carotid,radial,femoral,dorsalis pedis and posterior tibial pulses are full and equal bilaterally Extremities:  2+ left pedal edema and 2+ right pedal edema.   Neurologic:  alert & oriented X3, cranial nerves II-XII intact, strength normal in all extremities, sensation intact to light touch,  gait normal, and DTRs symmetrical and normal.   Skin:  turgor normal, color normal, no rashes, no suspicious lesions, no ecchymoses, no petechiae, no purpura, no ulcerations, and no edema.   Cervical Nodes:  no anterior cervical adenopathy and no posterior cervical adenopathy.   Axillary Nodes:  no R axillary adenopathy and no L axillary adenopathy.   Inguinal Nodes:  no R inguinal adenopathy and no L inguinal adenopathy.   Psych:  Cognition and judgment appear intact. Alert and cooperative with normal attention span and concentration. No apparent delusions, illusions, hallucinations   Impression & Recommendations:  Problem # 1:  TOBACCO USE (ICD-305.1) Assessment Unchanged  Orders: Tobacco use cessation intermediate 3-10 minutes (62703)  Encouraged smoking cessation and discussed different methods for smoking cessation.   Problem # 2:  UNSPECIFIED HYPOTHYROIDISM (ICD-244.9) Assessment: Unchanged  Her updated medication list for this problem includes:    Synthroid 25 Mcg Tabs (Levothyroxine sodium) ..... One by mouth once daily for thyroid  Orders: Venipuncture (50093) TLB-BMP (Basic Metabolic Panel-BMET) (81829-HBZJIRC) TLB-CBC Platelet - w/Differential (85025-CBCD) TLB-Hepatic/Liver Function Pnl (80076-HEPATIC) TLB-TSH (Thyroid Stimulating Hormone) (84443-TSH)  Problem # 3:  HYPERTENSION (ICD-401.9) Assessment: Unchanged  Her updated medication list for this problem includes:    Diovan Hct 160-12.5 Mg Tabs (Valsartan-hydrochlorothiazide) ..... One by mouth once daily for high blood pressure  Orders: Venipuncture (78938) TLB-BMP (Basic Metabolic Panel-BMET) (10175-ZWCHENI) TLB-CBC Platelet - w/Differential (85025-CBCD) TLB-Hepatic/Liver Function Pnl (80076-HEPATIC) TLB-TSH (Thyroid Stimulating Hormone) (84443-TSH) Tobacco use cessation intermediate 3-10 minutes (99406)  BP today: 130/64 Prior BP: 116/78 (08/06/2010)  Prior 10 Yr Risk Heart Disease: Not enough  information (06/05/2010)  Labs Reviewed: K+: 4.4 (08/06/2010) Creat: : 0.9 (08/06/2010)     Problem # 4:  HEPATITIS C WITHOUT HEPATIC COMA (ICD-070.51) Assessment: Unchanged  Orders: Venipuncture (77824) TLB-BMP (Basic Metabolic Panel-BMET) (23536-RWERXVQ) TLB-CBC Platelet - w/Differential (85025-CBCD) TLB-Hepatic/Liver Function Pnl (80076-HEPATIC) TLB-TSH (Thyroid Stimulating Hormone) (84443-TSH)  Problem # 5:  DEPRESSION (ICD-311) Assessment: Unchanged  Her updated medication list for this problem includes:    Alprazolam 0.5 Mg Tabs (Alprazolam) .[name]... Take 1 tablet by mouth three times a day    Celexa 40 Mg Tabs (Citalopram hydrobromide) .[name]... 1 1/2 once daily  Discussed treatment options, including trial of antidpressant medication. Will refer to behavioral health. Follow-up call in in 24-48 hours and recheck in 2 weeks, sooner as needed. Patient agrees to call if any worsening of symptoms or thoughts of doing harm arise. Verified that the patient has no suicidal ideation at this time.  Complete Medication List: 1)  Gabapentin 300 Mg Caps (Gabapentin) .... Take 1 tablet by mouth three times a day 2)  Alprazolam 0.5 Mg Tabs (Alprazolam) .... Take 1 tablet by mouth three times a day 3)  Celexa 40 Mg Tabs (Citalopram hydrobromide) .[name].. 1 1/2 once daily 4)  Proair Hfa 108 (90 Base) Mcg/act Aers (Albuterol sulfate) .... 2 puffs qid as needed wheezing 5)  Triamcinolone Acetonide 0.1 % Oint (Triamcinolone acetonide) .... Apply to aa three times a day as needed 6)  Synthroid 25 Mcg Tabs (Levothyroxine sodium) .... One by mouth once daily for thyroid 7)  Diovan Hct 160-12.5 Mg Tabs (Valsartan-hydrochlorothiazide) .... One by mouth once daily for high blood pressure 8)  Ibuprofen 600 Mg Tabs (Ibuprofen) .... One by mouth three times a day with food as needed for low back pain  Patient Instructions: 1)  Please schedule a follow-up appointment in 4 months. 2)  Tobacco is very bad for  your health and your loved ones! You Should stop smoking!. 3)  Stop Smoking Tips: Choose a Quit date. Cut down before the Quit date. decide what you will do as a substitute when you feel the urge to smoke(gum,toothpick,exercise). 4)  It is important that you exercise regularly at least 20 minutes 5 times a week. If you develop chest pain, have severe difficulty breathing, or feel very tired , stop exercising immediately and seek medical attention. 5)  You need to lose weight. Consider a lower calorie diet and regular exercise.  6)  Check your Blood Pressure regularly. If it is above 130/80: you should make an appointment. Prescriptions: ALPRAZOLAM 0.5 MG TABS (ALPRAZOLAM) Take 1 tablet by mouth three times a day  #50 x 3   Entered and Authorized by:   [name] MD   Signed by:   [name] MD on 12/07/2010   Method used:   Print then Give to Patient   RxID:   [id number]    Orders Added: 1)  Venipuncture [72072] 2)  TLB-BMP (Basic Metabolic Panel-BMET) [18288-FDVOUZH] 3)  TLB-CBC Platelet - w/Differential [85025-CBCD] 4)  TLB-Hepatic/Liver Function Pnl [80076-HEPATIC] 5)  TLB-TSH (Thyroid Stimulating Hormone) [84443-TSH] 6)  Tobacco use cessation intermediate 3-10 minutes [99406] 7)  Est. Patient Level IV [46047]

## 2010-12-15 NOTE — Letter (Signed)
Summary: Results Follow-up Letter  [facility]   [place], [place] [address]   Phone: [phone]  Fax: [phone]    12/07/2010  [place], [place]  [address]  [place]  Dear Ms. Arocho,   The following are the results of your recent test(s):  Test     Result     Sodium level   low Liver       enzymes are elevated Kidney     normal Thyroid     normal  _________________________________________________________  Please call for an appointment as directed _________________________________________________________ _________________________________________________________ _________________________________________________________  Sincerely,  [name] MD [facility]

## 2010-12-18 LAB — POCT CARDIAC MARKERS
CKMB, poc: 1.2 ng/mL (ref 1.0–8.0)
CKMB, poc: 1.7 ng/mL (ref 1.0–8.0)
Myoglobin, poc: 65 ng/mL (ref 12–200)
Troponin i, poc: <0.05 ng/mL (ref 0.00–0.09)
Troponin i, poc: <0.05 ng/mL (ref 0.00–0.09)

## 2010-12-18 LAB — CBC
HCT: 38 % (ref 36.0–46.0)
Hemoglobin: 12.7 g/dL (ref 12.0–15.0)
MCV: 93.9 fL (ref 78.0–100.0)
RBC: 4.04 MIL/uL (ref 3.87–5.11)
WBC: 4.6 10*3/uL (ref 4.0–10.5)

## 2010-12-18 LAB — DIFFERENTIAL
Eosinophils Relative: 1 % (ref 0–5)
Lymphocytes Relative: 31 % (ref 12–46)
Lymphs Abs: 1.4 10*3/uL (ref 0.7–4.0)
Monocytes Absolute: 0.4 10*3/uL (ref 0.1–1.0)
Monocytes Relative: 8 % (ref 3–12)
Neutro Abs: 2.7 10*3/uL (ref 1.7–7.7)

## 2010-12-18 LAB — URINALYSIS, ROUTINE W REFLEX MICROSCOPIC
Glucose, UA: 100 mg/dL — AB
Ketones, ur: NEGATIVE mg/dL
Specific Gravity, Urine: 1.012 (ref 1.005–1.030)
pH: 6.5 (ref 5.0–8.0)

## 2010-12-18 LAB — BASIC METABOLIC PANEL
BUN: 7 mg/dL (ref 6–23)
Chloride: 107 mEq/L (ref 96–112)
GFR calc Af Amer: >60 mL/min (ref >60)
Potassium: 4.4 mEq/L (ref 3.5–5.1)
Sodium: 141 mEq/L (ref 135–145)

## 2010-12-21 LAB — BASIC METABOLIC PANEL
BUN: 5 mg/dL — ABNORMAL LOW (ref 6–23)
BUN: <1 mg/dL — ABNORMAL LOW (ref 6–23)
CO2: 23 mEq/L (ref 19–32)
CO2: 24 mEq/L (ref 19–32)
Calcium: 7.9 mg/dL — ABNORMAL LOW (ref 8.4–10.5)
Calcium: 8 mg/dL — ABNORMAL LOW (ref 8.4–10.5)
Chloride: 106 mEq/L (ref 96–112)
Chloride: 109 mEq/L (ref 96–112)
Creatinine, Ser: 0.68 mg/dL (ref 0.4–1.2)
Creatinine, Ser: 0.72 mg/dL (ref 0.4–1.2)
GFR calc Af Amer: >60 mL/min (ref >60)
GFR calc Af Amer: >60 mL/min (ref >60)
GFR calc non Af Amer: >60 mL/min (ref >60)
GFR calc non Af Amer: >60 mL/min (ref >60)
Glucose, Bld: 119 mg/dL — ABNORMAL HIGH (ref 70–99)
Glucose, Bld: 238 mg/dL — ABNORMAL HIGH (ref 70–99)
Glucose, Bld: 83 mg/dL (ref 70–99)
Potassium: 2.9 mEq/L — ABNORMAL LOW (ref 3.5–5.1)
Potassium: 3.2 mEq/L — ABNORMAL LOW (ref 3.5–5.1)
Potassium: 3.5 mEq/L (ref 3.5–5.1)
Sodium: 138 mEq/L (ref 135–145)
Sodium: 139 mEq/L (ref 135–145)

## 2010-12-21 LAB — MAGNESIUM
Magnesium: 2 mg/dL (ref 1.5–2.5)
Magnesium: 2.7 mg/dL — ABNORMAL HIGH (ref 1.5–2.5)

## 2010-12-21 LAB — POCT I-STAT 3, ART BLOOD GAS (G3+)
Acid-base deficit: 1 mmol/L (ref 0.0–2.0)
Acid-base deficit: 19 mmol/L — ABNORMAL HIGH (ref 0.0–2.0)
Acid-base deficit: 4 mmol/L — ABNORMAL HIGH (ref 0.0–2.0)
Bicarbonate: 23.1 mEq/L (ref 20.0–24.0)
Bicarbonate: 23.6 mEq/L (ref 20.0–24.0)
Bicarbonate: 25.7 mEq/L — ABNORMAL HIGH (ref 20.0–24.0)
O2 Saturation: 100 %
O2 Saturation: 99 %
O2 Saturation: 99 %
Patient temperature: 97.6
Patient temperature: 98
TCO2: 24 mmol/L (ref 0–100)
TCO2: 25 mmol/L (ref 0–100)
TCO2: 25 mmol/L (ref 0–100)
TCO2: 28 mmol/L (ref 0–100)
pCO2 arterial: 23.5 mmHg — ABNORMAL LOW (ref 35.0–45.0)
pCO2 arterial: 34 mmHg — ABNORMAL LOW (ref 35.0–45.0)
pCO2 arterial: 51.3 mmHg — ABNORMAL HIGH (ref 35.0–45.0)
pO2, Arterial: 74 mmHg — ABNORMAL LOW (ref 80.0–100.0)

## 2010-12-21 LAB — COMPREHENSIVE METABOLIC PANEL
AST: 29 U/L (ref 0–37)
Albumin: 2.8 g/dL — ABNORMAL LOW (ref 3.5–5.2)
Albumin: 3.2 g/dL — ABNORMAL LOW (ref 3.5–5.2)
Alkaline Phosphatase: 71 U/L (ref 39–117)
BUN: 5 mg/dL — ABNORMAL LOW (ref 6–23)
CO2: 22 mEq/L (ref 19–32)
Calcium: 9 mg/dL (ref 8.4–10.5)
Chloride: 105 mEq/L (ref 96–112)
Creatinine, Ser: 0.72 mg/dL (ref 0.4–1.2)
Creatinine, Ser: 0.76 mg/dL (ref 0.4–1.2)
GFR calc Af Amer: >60 mL/min (ref >60)
GFR calc non Af Amer: >60 mL/min (ref >60)
Glucose, Bld: 197 mg/dL — ABNORMAL HIGH (ref 70–99)
Potassium: 3 mEq/L — ABNORMAL LOW (ref 3.5–5.1)
Sodium: 142 mEq/L (ref 135–145)
Total Bilirubin: 0.8 mg/dL (ref 0.3–1.2)
Total Protein: 6.3 g/dL (ref 6.0–8.3)

## 2010-12-21 LAB — DIFFERENTIAL
Basophils Absolute: 0 10*3/uL (ref 0.0–0.1)
Basophils Absolute: 0 10*3/uL (ref 0.0–0.1)
Basophils Relative: 0 % (ref 0–1)
Eosinophils Relative: 0 % (ref 0–5)
Eosinophils Relative: 1 % (ref 0–5)
Lymphocytes Relative: 26 % (ref 12–46)
Lymphocytes Relative: 29 % (ref 12–46)
Lymphs Abs: 2.1 10*3/uL (ref 0.7–4.0)
Monocytes Absolute: 0.4 10*3/uL (ref 0.1–1.0)
Neutro Abs: 3.8 10*3/uL (ref 1.7–7.7)
Neutrophils Relative %: 62 % (ref 43–77)

## 2010-12-21 LAB — TSH: TSH: 0.822 u[IU]/mL (ref 0.350–4.500)

## 2010-12-21 LAB — GLUCOSE, CAPILLARY
Glucose-Capillary: 102 mg/dL — ABNORMAL HIGH (ref 70–99)
Glucose-Capillary: 103 mg/dL — ABNORMAL HIGH (ref 70–99)
Glucose-Capillary: 108 mg/dL — ABNORMAL HIGH (ref 70–99)
Glucose-Capillary: 117 mg/dL — ABNORMAL HIGH (ref 70–99)
Glucose-Capillary: 133 mg/dL — ABNORMAL HIGH (ref 70–99)
Glucose-Capillary: 151 mg/dL — ABNORMAL HIGH (ref 70–99)
Glucose-Capillary: 169 mg/dL — ABNORMAL HIGH (ref 70–99)
Glucose-Capillary: 209 mg/dL — ABNORMAL HIGH (ref 70–99)
Glucose-Capillary: 281 mg/dL — ABNORMAL HIGH (ref 70–99)
Glucose-Capillary: 85 mg/dL (ref 70–99)

## 2010-12-21 LAB — RAPID URINE DRUG SCREEN, HOSP PERFORMED
Barbiturates: NOT DETECTED
Barbiturates: NOT DETECTED
Benzodiazepines: POSITIVE — AB
Cocaine: NOT DETECTED
Opiates: NOT DETECTED

## 2010-12-21 LAB — PHOSPHORUS
Phosphorus: 1.6 mg/dL — ABNORMAL LOW (ref 2.3–4.6)
Phosphorus: 3.5 mg/dL (ref 2.3–4.6)
Phosphorus: 4 mg/dL (ref 2.3–4.6)

## 2010-12-21 LAB — CBC
HCT: 35.5 % — ABNORMAL LOW (ref 36.0–46.0)
HCT: 42.4 % (ref 36.0–46.0)
HCT: 42.7 % (ref 36.0–46.0)
Hemoglobin: 11.8 g/dL — ABNORMAL LOW (ref 12.0–15.0)
Hemoglobin: 14.4 g/dL (ref 12.0–15.0)
Hemoglobin: 14.6 g/dL (ref 12.0–15.0)
MCHC: 33.6 g/dL (ref 30.0–36.0)
MCHC: 34 g/dL (ref 30.0–36.0)
MCHC: 34.3 g/dL (ref 30.0–36.0)
MCV: 92.3 fL (ref 78.0–100.0)
MCV: 93.6 fL (ref 78.0–100.0)
MCV: 93.8 fL (ref 78.0–100.0)
Platelets: 118 10*3/uL — ABNORMAL LOW (ref 150–400)
Platelets: 83 10*3/uL — ABNORMAL LOW (ref 150–400)
Platelets: 94 10*3/uL — ABNORMAL LOW (ref 150–400)
RBC: 3.77 MIL/uL — ABNORMAL LOW (ref 3.87–5.11)
RBC: 4.59 MIL/uL (ref 3.87–5.11)
RDW: 12.7 % (ref 11.5–15.5)
RDW: 12.9 % (ref 11.5–15.5)
RDW: 13.1 % (ref 11.5–15.5)
WBC: 5.7 10*3/uL (ref 4.0–10.5)
WBC: 7.2 10*3/uL (ref 4.0–10.5)

## 2010-12-21 LAB — ETHANOL: Alcohol, Ethyl (B): <5 mg/dL (ref 0–10)

## 2010-12-21 LAB — CULTURE, RESPIRATORY W GRAM STAIN

## 2010-12-21 LAB — CARDIAC PANEL(CRET KIN+CKTOT+MB+TROPI)
CK, MB: 2.6 ng/mL (ref 0.3–4.0)
CK, MB: 4.4 ng/mL — ABNORMAL HIGH (ref 0.3–4.0)
Total CK: 179 U/L — ABNORMAL HIGH (ref 7–177)
Troponin I: 0.02 ng/mL (ref 0.00–0.06)
Troponin I: 0.02 ng/mL (ref 0.00–0.06)

## 2010-12-21 LAB — URINALYSIS, ROUTINE W REFLEX MICROSCOPIC
Hgb urine dipstick: NEGATIVE
Protein, ur: NEGATIVE mg/dL
Urobilinogen, UA: 1 mg/dL (ref 0.0–1.0)

## 2010-12-21 LAB — CULTURE, BLOOD (ROUTINE X 2): Culture: NO GROWTH

## 2010-12-21 LAB — URINE CULTURE

## 2010-12-21 LAB — OSMOLALITY: Osmolality: 282 mOsm/kg (ref 275–300)

## 2010-12-21 LAB — APTT: aPTT: 20 seconds — ABNORMAL LOW (ref 24–37)

## 2010-12-21 LAB — SALICYLATE LEVEL: Salicylate Lvl: <4 mg/dL (ref 2.8–20.0)

## 2011-02-16 NOTE — Assessment & Plan Note (Signed)
Ms. Pistilli returns to the clinic today for followup evaluation.  She  reports that she is doing well overall.  She reports that when we last  saw her on July 03, 2007, we had increased her Avinza from 60 mg  twice a day to 60 mg three times a day and that has helped  substantially.  She continues to use the Tramadol approximately 3 times  a day and needs refills on both over the next few days.  She has had  Phenergan added for nausea by her primary care physician.   REVIEW OF SYSTEMS:  Positive for weight loss, along with abdominal pain,  limb swelling, and nausea.   MEDICATIONS:  1. Avinza 60 mg t.i.d.  2. Zyprexa 7.5 mg q.h.s.  3. Prevacid 30 mg daily.  4. Celexa 40 mg q.h.s.  5. Xanax 0.5 mg t.i.d. p.r.n.  6. Albuterol inhaler p.r.n.  7. Advair Diskus p.r.n.  8. Tramadol 50 mg t.i.d. p.r.n.  9. Phenergan p.r.n.   PHYSICAL EXAMINATION:  GENERAL:  A reasonably well-appearing middle-aged  adult female in mild acute discomfort.  VITAL SIGNS:  Blood pressure was 152/82, with a pulse of 89, respiratory  rate 18, and O2 saturation 99% on room air.  MUSCULOSKELETAL:  She has 5 minus over 5 strength throughout.  She  ambulates without any assistive device.   IMPRESSION:  1. Chronic abdominal pain secondary to chronic pancreatitis.  2. History of hepatitis C.   In the office today, we did refill the patient's Avinza as of December  20th and refilled her tramadol as of December 27th.  There are refills  on the Tramadol but none on the Avinza.  We will plan on seeing the  patient in followup in this office in approximately 3 to 4 months' time  with refills prior to that appointment as necessary.           ______________________________  Jarvis Morgan, M.D.     DC/MedQ  D:  09/21/2007 11:09:39  T:  09/21/2007 19:53:16  Job #:  [id number]

## 2011-02-16 NOTE — Assessment & Plan Note (Signed)
Phyllis [name] returns to the clinic today for followup evaluation.  She is  thrilled that she has been able to quit tobacco for 2 months now.  The  impetus apparently was the birth of a grandchild approximately a week  and half ago.  She did not wish to expose the grandchild to tobacco and  has been able to quit completely for 2 months now.  She also has been  granted disability and is awaiting a payment with Medicare card that she  is planning to use.  She reports no problems with her pancreas that she  has been able to avoid foods that exacerbate her pain.  She tends to be  very careful about what food she avoids and eat.   REVIEW OF SYSTEMS:  Positive for weight gain along with nausea and  vomiting, abdominal pain, and limb swelling.   MEDICATIONS:  1. Avinza 60 mg t.i.d.  2. Tramadol 50 mg t.i.d. p.r.n.  3. Zyprexa 7.5 mg nightly.  4. Prevacid 30 mg.  5. Celexa 40 mg nightly.  6. Xanax 0.5 mg t.i.d. p.r.n.  7. Albuterol inhaler p.r.n.  8. Advair Diskus p.r.n.  9. Phenergan p.r.n.   PHYSICAL EXAMINATION:  GENERAL:  Well-appearing, middle-aged, adult  female in mild-to-no acute discomfort.  VITAL SIGNS:  Blood pressure 159/79 with a pulse of 74, respiratory rate  20, and O2 saturation 95% on room air.  MUSCULOSKELETAL:  She has 5-/5 strength throughout the bilateral upper  and lower extremities.  She ambulates without any assisted device.   IMPRESSION:  1. Chronic abdominal pain secondary to chronic pancreatitis.  2. History of hepatitis C.   In the office today, we did refill the patient's Avinza a total of 60  three tablets as of May 15, 2008.  Unfortunately, her present  insurance will only allow her a 3-week supply.  Hopefully, with a new  insurance through her Social Security and Disability, she would be able  to get a month supply at a time.  She will be contacted to near  pharmacist to see if that is the case.  In the meantime, she has a  sufficient supply of  tramadol.  We will plan on seeing the patient in  followup in this office at approximately 3-4 months' time with refills  prior to that appointment as necessary.           ______________________________  Jarvis Morgan, M.D.     DC/MedQ  D:  05/02/2008 10:08:21  T:  05/03/2008 04:15:28  Job #:  [id number]

## 2011-02-16 NOTE — Assessment & Plan Note (Signed)
Ms. Cillo return to clinic today for further evaluation.  She reports  that she is getting good relief from the combination of the tramadol and  Avinza.  She does need refills on each of those over the next few days  as her insurance only allows her a 21-day supply of all of her  medicines.   REVIEW OF SYSTEMS:  Positive for skin rash, diarrhea, nausea, poor  appetite, abdominal pain, wheezing and coughing.   MEDICATIONS:  1. Avinza 60 mg t.i.d.  2. Tramadol 50 mg t.i.d. p.r.n.  3. Zyprexa 7.5 mg q.h.s.  4. Prevacid 30 mg __________  .  5. Celexa 40 mg q.h.s.  6. Xanax 0.5 mg t.i.d. p.r.n.  7. Albuterol inhaler p.r.n.  8. Advair Diskus p.r.n.  9. Phenergan p.r.n.   PHYSICAL EXAM:  Reasonably well-appearing middle-aged adult female in  mild to no acute discomfort.  Blood pressure 130/71 with a pulse of 89,  respiratory rate 18, and O2 saturation 100% on room air.  She is 5-/5  strength throughout the bilateral upper and lower extremities.  She  ambulates without any assistive device.   IMPRESSION:  1. Chronic abdominal pain secondary to chronic pancreatitis.  2. History of hepatitis C.   In the office today we did refill the patient's Avinza along with her  tramadol each as of January 19, 2008.  We will plan on seeing the patient  in followup in approximately 3 months' time with refills prior to that  appointment as necessary.           ______________________________  Jarvis Morgan, M.D.     DC/MedQ  D:  01/11/2008 10:14:57  T:  01/11/2008 10:30:19  Job #:  [id number]

## 2011-02-16 NOTE — Assessment & Plan Note (Signed)
Ms. Tabares returns to clinic today for followup evaluation.  She did call  for a refill on her Avinza a few days ago and we have a prescription  ready for her dated August 21, 2008.  She forgot that she actually had  an appointment today, so she is picking up her script for her Avinza  with the appointment.  She does need a refill on her tramadol using 50  mg t.i.d.  Her other medicines have been unchanged.  She reports that  she will be coming under Medicare as of October 04, 2008.  She apparently  will be under Managed Medicare.  She may try to stay with  [employer].  She is not sure that how that will affect her pain  medicines and will have to wait to see.   MEDICATIONS:  1. Avinza 60 mg t.i.d.  2. Tramadol 50 mg t.i.d. p.r.n.  3. Zyprexa 7.5 mg nightly.  4. Prevacid 30 mg daily.  5. Celexa 40 mg nightly.  6. Xanax 0.5 mg t.i.d. p.r.n.  7. Albuterol inhaler p.r.n.  8. Advair Diskus p.r.n.  9. Phenergan p.r.n.   REVIEW OF SYSTEMS:  Positive for a skin rash, reflux, nausea, and poor  appetite.   PHYSICAL EXAMINATION:  GENERAL:  A well-appearing, middle-aged adult  female in mild acute discomfort.  VITAL SIGNS:  Blood pressure 147/87 with a pulse of 104, respiratory  rate 18, and O2 saturation 95% on room air.  MUSCULOSKELETAL:  She has 5-/5 strength throughout the bilateral upper  and lower extremities.  She ambulates without any assistive device.   IMPRESSION:  1. Chronic abdominal pain secondary to chronic pancreatitis.  2. History of hepatitis C.   In the office today, we did give the prescription for the Avinza and  refilled her tramadol as of August 26, 2008.  We will plan on seeing  the patient in followup in this office in approximately 4 months' time  with refills prior to that appointment as necessary.  She continues to  get good analgesic effect overall without signs of diversion or  significant side effects.           ______________________________  Jarvis Morgan, M.D.     DC/MedQ  D:  08/23/2008 13:00:16  T:  08/24/2008 01:20:23  Job #:  [id number]

## 2011-02-16 NOTE — Assessment & Plan Note (Signed)
HISTORY OF PRESENT ILLNESS:  Phyllis [name] returns to the clinic today for  followup evaluation. She reports that she is getting less than  sufficient relief using the Avinza for her pain medicine. She had been  getting good relief in the past but apparnetly has built up some  tolerance. She generally takes the first dose at 7:00 a.m. and gets  relief until approximately 11:00 a.m. She does not have another  scheduled dose until 7 or 8 in the evening but generally takes that at  4:30 or so in the afternoon and then struggles through the evening or  night hours until her next dose in the morning. She does also take  Tramadol 50 mg t.i.d. and has sufficient supply of that at the present  time. She has had no hospitalizations and continues to be followed for  her pancreatitis by Dr. Delfin Edis.   REVIEW OF SYSTEMS:  Positive for weight loss, skin rash, high blood  sugars, nausea, abdominal pain, and poor appetite.   MEDICATIONS:  1. Avinza 60 mg q.12 hours.  2. Zyprexa 7.5 mg q.h.s.  3. Prevacid 30 mg q.h.s.  4. Celexa 40 mg q.h.s.  5. Xanax 0.5 mg 1 tablet t.i.d. p.r.n.  6. Albuterol inhaler p.r.n.  7. Advair Diskus p.r.n.  8. Tramadol 50 mg t.i.d. p.r.n.   PHYSICAL EXAMINATION:  GENERAL:  A reasonably well appearing middle aged  adult female in moderate acute discomfort.  VITAL SIGNS:  Blood pressure 121/50 with pulse of 94. Respiratory rate  18 and O2 saturation 95% on room air.  EXTREMITIES:  She has 5 minus over 5 strength throughout. She ambulates  without any assistive device.   IMPRESSION:  1. Chronic abdominal pain secondary to chronic pancreatitis.  2. History of hepatitis C.   PLAN:  In the office today, we did adjust the patient's Avinza up to 60  mg t.i.d. from b.i.d. She has sufficient supply to last for another 6 to  7 days. We have given her a refill as of July 08, 2007, a total of 63,  which is a 21 day supply, which is all that is allowed by her insurance  carrier. She has been instructed to inform the pharmacist that she is  getting the prescription early, secondary to the adjustment in the dose  today. No refill on the Tramadol is necessary at this time. Will plan on  seeing her in followup in approximately 3 months time.           ______________________________  Jarvis Morgan, M.D.     DC/MedQ  D:  07/03/2007 15:52:39  T:  07/03/2007 20:58:02  Job #:  [id number]

## 2011-02-16 NOTE — Assessment & Plan Note (Signed)
Ms. Gagen returns to clinic today for followup evaluation. She reports  that she has a sufficient supply of the Avinza uses 60 mg twice a day.  She does need a refill on her Ultram which she generally uses 3 times  per day. She reports that her recent blood sugar has been 150 but her  primary care physician has decided not to start her on any medication  until it is at least 175. She reports her other medicines have stayed  the same.   REVIEW OF SYSTEMS:  Positive for poor appetite, nausea, vomiting, high  blood sugar, skin rash, weight loss.   PHYSICAL EXAMINATION:  Generally well-appearing thin adult female in  mild-to-no acute discomfort. Blood pressure 165/91, with a pulse of 93,  respiratory rate 17, and O2 saturation 92% on room air.  She has 5-/5 strength throughout. She ambulates without any assisted  device.   IMPRESSION:  1. Chronic abdominal pain secondary to chronic pancreatitis.  2. History of hepatitis C.   In the office today we did refill the patient's Ultram 50 mg 1 tablet  t.i.d. p.r.n. a total of 90 with 3 refills. We will plan on seeing the  patient in follow up in approximately 3 months time with refills prior  to that appointment as necessary. No refill on the Avinza is necessary  at this time.           ______________________________  Jarvis Morgan, M.D.     DC/MedQ  D:  04/05/2007 09:54:21  T:  04/05/2007 13:49:53  Job #:  [id number]

## 2011-02-19 NOTE — Consult Note (Signed)
NAME:  Phyllis [name], Phyllis [name]                         ACCOUNT NO.:  [id number]   MEDICAL RECORD NO.:  [id number]                   PATIENT TYPE:  REC   LOCATION:  [facility]                                  FACILITY:  [facility]   PHYSICIAN:  Hans C. Carlos Levering, M.D.                DATE OF BIRTH:  [birth year]   DATE OF CONSULTATION:  07/25/2002  DATE OF DISCHARGE:                                   CONSULTATION   REASON FOR CONSULTATION:  [facility]  I evaluate her.  She is a kind referral from Dr. Jake Michaelis.  A 55 year old female with chronic pancreatitis.  Relating her pain as a 2/10  on a subjective scale, primarily abdominal with a somewhat radicular  component consisting of epigastric laterally, right greater then left.  She  has had this a number of years with decline in quality of life, with an  unknown etiology to her pancreatitis, suspected from a cholecystitis, she  denies alcohol or drug abuse.  She states that she does have hepatitis from  an unknown cause.  She is relating her pain is non-problematic at this time,  but has frequent bouts of pancreatic attacks, as she puts it, that has  essentially caused her to lose her job.  She states she has at least three a  year with hospitalization.  She has been trialed on analgesics with some  modestly progressive results, but continues to break through.  The pain is  dull and constant, burning and sometimes escalates to a 10/10.  Improved  with rest and medications, as well as hydration.  MRI and elements of  imaging are reviewed by report.   MEDICATIONS:  1 . Hydrocodone.  1. Duragesic.  2. Celexa.  3. Xanax.  4. Prevacid.  5. Promethium.  6. Avar.  7. Albuterol.   ALLERGIES:  No known drug allergies.   She states no harm to hurt self or others.   REVIEW OF SYSTEMS:  A 14 point review of systems, and health and history  form are reviewed.   PAST MEDICAL HISTORY:  1. 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  She stated it was benign.  2. Lumbar effusion.  3. Pancreatitis.  4. Cholecystectomy.  5. Hepatitis C.  She has not been transfused.   Other medical history is otherwise denied.   FAMILY HISTORY:  Unremarkable with the exception of hypertension.   SOCIAL HISTORY:  She is a smoker.  I cautioned.  Does not drink alcohol.  She is single.  She is not working at this time, would like to return to  work if possible.   PHYSICAL EXAMINATION:  GENERAL:  A pleasant female sitting comfortably in  bed.  Gait and affect and appearance are normal.  Oriented x3.  HEENT:  Unremarkable.  CHEST:  Clear to auscultation and percussion.  HEART:  Regular rate and rhythm  without murmurs, rubs, or gallops.  ABDOMEN:  Soft, slightly tender most notably in the right upper quadrant,  and I do not appreciate the liver edge.  She does have no  hepatosplenomegaly.  NEUROLOGIC:  She has diffuse paralumbar myofascial discomfort with pain over  PSIS and extension.  She has no other overt neurological deficits, motor,  sensory, or reflex.   IMPRESSION:  1. Chronic pancreatitis.  2. Hepatitis C.  3. ____________ health characteristics.   PLAN:  1. Cigarette cessation for best outcome.  2. Instructed to maintain contact with primary care.  3. Would consider it reasonable to increase her analgesic capacity, but must     clearly define who has the patient care narcotic agreement, and will call     for records.  4. I will follow up with her in a week or two once this is defined, and     likely increase her Duragesic to 50 mcg.  I would like to get away from     breakthrough medications.  She understands the concept of random drug     screens and accepts.  She understands the patient care agreement that     will be implemented in this facility and accepts this directive care.   Discharge instructions given.  Instructed to maintain contact with Dr.  Jake Michaelis as well.  Consider interventional blocks if  necessary, but at this  time I do not consider them mandatory in her treatment.                                               Hans C. Carlos Levering, M.D.    [facility]  D:  07/25/2002  T:  07/25/2002  Job:  [id number]   cc:   Harden Mo, M.D.

## 2011-02-19 NOTE — H&P (Signed)
[place]  Patient:    Phyllis [name]                     MRN: [id number] Adm. Date:  [id number] Disc. Date: [id number] Attending:  Ashok Pall                         History and Physical  ADMITTING DIAGNOSES:  1. Degenerative disk disease, L5-S1.  2. Low back pain.  INDICATIONS:  Phyllis [name] is a patient of mine who I have seen since May 1999. She initially presented with back and bilateral lower extremity pain which she ad had since 1998.  She is a 55 year old black woman who had had no prior history f back pain.  Phyllis [name] and I went through a long period of time where she had conservative measures instituted.  This did not improve her situation significantly.  She did not miss significant time at work, but her pain in the lower back and legs did make all activities much more uncomfortable.  She had some regression and we therefore agreed that she would go for an L5-S1 lumbar laminectomy and diskectomy as she did have what appeared to be a free fragment f disk, along with disk herniation at L5-S1.  She also had significant motor changes and a severely narrowed disk space at L5-S1.  A fusion was contemplated during he first case, but it was my feeling that she might possibly improve with a simple  diskectomy and that was what was undertaken.  Phyllis [name] underwent a lumbar laminotomy and diskectomy in December 2000.  That procedure was done without taking any of the lamina at L5 or of S1.  Postoperatively Ms. Delbridge stated that she felt absolutely no different.  The pain was not worse, it was not better, it was simply the same.  Her strength in the lower extremities was normal and her back wound healed without difficulty.  What occurred at that point in time, we that we tried to have her go back to work.  That did not go over well.  She started to have significant pain and was unable to return to work in February.  Given that  she as not getting any better and that she still had degenerative disease that was not was not addressed at the diskectomy, we agreed to go ahead with the fusion.  As she had already been decompressed from the posterior aspect, I felt an anterior approach would be beneficial given that it would not play into the scar tissue which would have already formed.  It was an easier approach for her to deal with, and also would make for an easier recuperation.  It also would not require any bone removal and cages could be placed anteriorly and all the bone preserved posteriorly to provide her maximal stability.  She was sent upon my recommendation to see Dr. Nicholaus Corolla who does the approach with me in these cases.  The decision was made at hat point in time for her to undergo an anterior lumbar fusion.  REVIEW OF SYSTEMS:  Positive for asthma, leg pain and back pain.  She is an anxious person.  Denies gastrointestinal, genitourinary, hematologic, endocrine, allergic, or neurologic difficulties.  PHYSICAL EXAMINATION:  GENERAL:  She is alert and oriented x 4 and answering all questions appropriately. She is well kempt and in no distress.  Memory, language, and attention span are  normal.  NEUROLOGIC:  Cranial nerves II-XII are normal on exam.  She had normal strength in the lower extremities.  She can toe walk, heel walk, do deep knee bends without  difficulty.  She has a normal gait, normal coordination, muscle tone and bulk. She has a well-healed wound in the lumbar region without signs of infection.  NECK:  No carotid bruits and no cervical masses.  LUNGS:  Lung fields clear.  HEART:  Regular rhythm and rate.  Pulses good at the wrists and feet bilaterally.  DIAGNOSES:  1. Degenerative disk disease, L5-S1.  2. Low back pain.  RECOMMENDATIONS:  I have recommended and Phyllis [name] has agreed to undergo an anterior lumbar interbody fusion ray-threaded cages.  Risks of the  procedure, bleeding, infection, nonfusion, hardware failure were discussed.  Dr. Armandina Gemma discussed the risks of the anterior approach with her separately and has documented this. DD:  01/18/00 TD:  01/18/00 Job: [id number]

## 2011-02-19 NOTE — Assessment & Plan Note (Signed)
HISTORY OF PRESENT ILLNESS:  [name] returns to the clinic today for  follow up evaluation.  She reports that she has developed some increased  pancreatic pain, more than her baseline.  She has these episodes  periodically and understands that she needs to rest and then go to a liquid  diet.  That seems to help.  She reports that her pain level is slightly  increased compared to baseline.  She does use her Avinza 60 mg q.12 h and  gets some relief with that medication.  She does need a refill as she is  only allowed a 21 day supply for some unknown reason.   The patient reports that her plan to return to work is on hold at the  present time secondary to the pancreatic pain.   MEDICATIONS:  1.  Avinza 60 mg one tablet q.12 h (21 day supply).  2.  Zyprexa 7.5 mg q.h.s.  3.  Prevacid 30 mg daily.  4.  Celebrex 40 mg daily.  5.  Xanax 0.5 mg two tablets daily p.r.n.  6.  Albuterol inhaler p.r.n.  7.  Advair discus inhaler p.r.n.   REVIEW OF SYSTEMS:  Positive for weight loss, nausea, abdominal pain and  poor appetite.   PHYSICAL EXAMINATION:  GENERAL APPEARANCE:  Reasonably well appearing, thin,  adult female in mild acute discomfort.  VITAL SIGNS:  Blood pressure 149/84, pulse 95, respirations 16, O2  saturation 99% on room air.  EXTREMITIES:  She has 5-/5 strength throughout the bilateral upper and lower  extremities.  She ambulates without any assisted devices.  ABDOMEN:  Slightly tender to light touch on the left side.   IMPRESSION:  1.  Chronic abdominal pain secondary to pancreatitis.  2.  History of hepatitis C.   PLAN:  In the office today, we did refill the patient's Avinza 60 mg one  tablet q.12 h for a total of 42 tablets to cover her for 21 days.  We plan  on seeing the patient in follow up in this office in approximately three  months time with refill medication prior to that appointment if necessary.           ______________________________  Jarvis Morgan,  M.D.     DC/MedQ  D:  09/06/2005 09:04:12  T:  09/06/2005 12:26:12  Job #:  [id number]

## 2011-02-19 NOTE — Discharge Summary (Signed)
[place]  Patient:    Phyllis [name], Phyllis [name]                    MRN: [id number] Adm. Date:  [id number] Disc. Date: [id number] Attending:  Ashok Pall                           Discharge Summary  ADMISSION DIAGNOSIS:  Degenerative disk disease L5-S1.  DISCHARGE DIAGNOSIS:  Degenerative disk disease L5-S1.  PROCEDURES:  Anterior lumbar fusion L5-S1.  COMPLICATIONS:  None.  CONDITION ON DISCHARGE:  Alive and well.  HISTORY OF PRESENT ILLNESS AND HOSPITAL COURSE:  Phyllis [name] was admitted to the hospital on January 18, 2000, for pain in the lower back.  Previous lumbar diskectomy done in December 2000, did not alleviate a significant portion of the pain.  Therefore, I elected to proceed with a fusion at L5-S1.  Phyllis [name] was admitted, taken to the operating room, and underwent an uncomplicated anterior lumbar interbody fusion using Ray threaded cages.  Her approach was performed by Dr. Armandina Gemma without difficulty. DD:  02/12/00 TD:  02/15/00 Job: [id number]

## 2011-02-19 NOTE — Consult Note (Signed)
NAME:  Phyllis [name], Phyllis [name]                         ACCOUNT NO.:  [id number]   MEDICAL RECORD NO.:  [id number]                   PATIENT TYPE:  REC   LOCATION:  [facility]                                  FACILITY:  [facility]   PHYSICIAN:  Hans C. Carlos Levering, M.D.                DATE OF BIRTH:  Nov 15, 1955   DATE OF CONSULTATION:  07/31/2002  DATE OF DISCHARGE:                                   CONSULTATION   [facility].  I reviewed the  health and history form and 14-point review of systems.   1. We are going to establish the relationship of medication control and     review this with her.  We will review the patient care agreement,     expectations, singular dispensing pharmacy, and prescribing physician.     She agrees.  She understands the potential habituating nature and side     effects of these medications, as well as potential risks.  2. Continue on Duragesic as she seems to be doing well with this medication     and obtain records from her primary care physicians.  This is in the     works.  3. With full informed consent obtained and a urine drug screen, she agrees.     She understands the concept of random drug screens.   We review her medications.  She is appropriately.   OBJECTIVE:  Diffuse abdominal discomfort, slightly increased upper abdominal  discomfort in right and left upper quadrant.  No new neurological findings.  Motor and sensory reflexic.  Myofascial is pronounced in the paralumbar  region.   IMPRESSION:  1. Hepatitis C.  2. Poor overall health characteristics.  3. Chronic pancreatitis, stable.   PLAN:  Continue current medications.  We discuss whether we should just go  to a p.r.n. strategy or maintain a pharmacokinetically long-acting drug at  baseline.  Apparently when she just goes to p.r.n. she has exacerbations of  pain and apparently has difficulty with catch up.  Will monitor usage  patterns and necessity for these  medications and see her in follow-up.  Extensive consultation as to review of patient care agreement, use of  narcotics for chronic nonmalignant, and overall directed care approach.  Lifestyle enhancements discussed.  Will see her in follow-up.                                               Hans C. Carlos Levering, M.D.    [facility]  D:  07/31/2002  T:  07/31/2002  Job:  [id number]   cc:   Harden Mo, M.D.

## 2011-02-19 NOTE — H&P (Signed)
[facility]  Patient:    Phyllis [name], Phyllis [name]                      MRN: [id number] Adm. Date:  [id number] Attending:  Vincent Peyer Dictator:   Amy S. Winicov, P.A. CC:         Dr. Jake Michaelis   History and Physical  CHIEF COMPLAINT:  Abdominal pain, nausea and vomiting.  HISTORY OF PRESENT ILLNESS:  Phyllis [name] is a 55 year old African-American female known to [place]. Phyllis [name], M.D., primary patient of Dr. Jake Michaelis who has a history of chronic pancreatitis. She had ERCP in 1999 per Lowella Bandy. Phyllis [name], M.D. showing a stricture of the main pancreatic duct at the junction of the body and head of the pancreas and a common bile duct stricture without significant proximal dilation. The patient is status post remote cholecystectomy in 1994. Has had bilateral tubal ligation. She has history of remote hemorrhoidectomy. She is also hepatitis C positive and has not been treated. The patient does have remote history of alcohol use, apparently none over the past 4 years and a more remote history of IV drug use. The patient was last hospitalized in 1999 with post ERCP pancreatitis. Apparently, she has been doing fairly well over the past couple of years with some milder bouts of abdominal pain which have not required hospitalization. More acutely over the past 2 weeks, she has developed epigastric discomfort and upper abdominal discomfort which has been constant and progressive and unrelieved by Vicodin as an outpatient. She has not had any vomiting until today on the day of admission. She has been nauseated, however, at home. She has not had any documented fever or chills. No diarrhea, melena, or hematochezia. Her husband reports that she has been eating very little though no significant change in her weight. She had been seen by Lowella Bandy. Phyllis [name], M.D. in the office approximately a week ago, felt to have relapsing pancreatitis. Was treated with bowel rest and pancreatic enzymes,  as well as hydrocodone. At this time, she presents to the emergency room with progressive pain and is admitted for pain control and further diagnostic evaluation.  CURRENT MEDICATIONS: 1. Singulair 10 mg p.o. q.d. 2. Advair inhaler q.d. 3. Atrovent 2 puffs q.d. 4. Albuterol 2 puffs q.d. p.r.n. 5. Celexa 40 mg b.i.d. 6. Xanax 0.5 t.i.d. p.r.n. 7. Prometrium 100 mg two caps q.d. 8. Pancreatic enzymes a.c. 9. Hydrocodone p.r.n.  ALLERGIES:  NAPROSYN with GI intolerance.  PAST MEDICAL HISTORY: 1. Status post cholecystectomy in 1994. 2. Chronic pancreatitis with last admission 1999. 3. Status post bilateral tubal ligation. 4. Status post hemorrhoidectomy. 5. History of hepatitis C, not treated. 6. History of remote alcohol abuse, none x 3 to 4 years and more remote IV    drug use, none x 14 years. 7. Asthmatic bronchitis.  FAMILY HISTORY:  Noncontributory for GI disease.  SOCIAL HISTORY:  The patient is married. Has one child. She is not employed. She is a smoker, one pack per day. Denies any current alcohol use.  REVIEW OF SYSTEMS:  CARDIOVASCULAR:  Denies any chest pain or anginal symptoms. PULMONARY:  Denies any cough, shortness of breath, or sputum production. GENITOURINARY:  Denies any dysuria, urgency, or frequency. GI:  As above.  PHYSICAL EXAMINATION:  GENERAL:  The patient is a well-developed, African-American female, sedated post narcotics for pain. Arouses though when spoken to.  VITAL SIGNS:  Blood pressure 159/85, pulse is 108, temperature is 97.3, O2 saturation is  97 on room air, weight is 152.  HEENT:  Atraumatic, normocephalic. EOMI. PERRLA. Sclerae anicteric.  NECK:  Supple without nodes.  PULMONARY:  Clear to A&P.  CARDIOVASCULAR:  Regular rate and rhythm with S1 and S2 slightly tachy.  ABDOMEN:  Soft. Bowel sounds are hypoactive. She has healed incisional scars. She is very tender in the epigastrium and laterally in the left upper quadrant and  right upper quadrant. There is no guarding or rebound and no palpable mass.  RECTAL:  Not done at this time.  EXTREMITIES:  Without clubbing, cyanosis, or edema.  NEUROLOGICAL:  Grossly nonfocal but patient sedated post narcotics.  LABORATORY DATA:  On admission show a WBC of 12.5, hemoglobin 13.9, hematocrit of 41.6, MCV of 98, platelet count of 278. Sodium 123, potassium 3.2, glucose 154, total bilirubin 0.6, alkaline phosphatase 63, SGOT 23, SGPT 30, albumin of 3.4, amylase 206, lipase was 768 on admission.  IMPRESSION: 1. A 55 year old female with chronic relapsing pancreatitis with recurrent    pancreatitis refractory to outpatient management. 2. Status post remote cholecystectomy. 3. Hepatitis C. 4. Asthmatic bronchitis. 5. Chronic anxiety. 6. Hyponatremia and hypokalemia.  PLAN:  The patient is admitted to the service of Lowella Bandy. Phyllis [name], M.D. for IV fluid hydration, bowel rest, pain control, and will likely require a PCA. We will plan CT scan of the abdomen and pelvis today and then further workup depending on her course. DD:  01/30/01 TD:  01/30/01 Job: [id number]

## 2011-02-19 NOTE — Consult Note (Signed)
   NAME:  Phyllis, [name] NO.:  [id number]   MEDICAL RECORD NO.:  [id number]                   PATIENT TYPE:  REC   LOCATION:  [facility]                                  FACILITY:  [facility]   PHYSICIAN:  Rosann Auerbach, MD                     DATE OF BIRTH:  [birth year]   DATE OF CONSULTATION:  01/22/2003  DATE OF DISCHARGE:                                   CONSULTATION   REFERRING PHYSICIAN:  Harden Mo, M.D.   HISTORY OF PRESENT ILLNESS:  Phyllis [name] comes in for pain management  today.  I evaluated her via ________ 14 point review of systems today.   1. Phyllis [name] comes in today and is given one round of Zinacef.  I do not     believe further interventional procedures are warranted, and she seems to     be doing well from a conservative management position.  Again, I     discussed lifestyle compositions, which is cigarette cessation and     lifestyle enhancement.  2. I will follow up with her [place] physiatry.  Colleagues will follow up in     about a month and a half.  I am going to go ahead and give her a     postdated prescription, she is using this appropriately, brings     medications in, and at this time I do not think an MBS procedure is     indicated.  We will keep a high level of vigilance, and I asked her to     maintain contact with primary and I reviewed this with her.  3. Follow up with her as needed in the AI interim.  She will let us know if     there is further decline.   PHYSICAL EXAMINATION:  ABDOMEN:  Diffuse abdominal discomfort in focused  exam with myofascial discomfort noted.  NEUROLOGIC:  She has no new presenting features.   IMPRESSION:  1. Chronic pancreatitis, stable.  2. Chronic pain syndrome.   PLAN:  1. Benzodiazepine 60 mg, one q.a.m.  I reviewed the risks, complications,     and options of this medication, potential     habituating nature, and she accepts this.  She is cautioned as to making     important decisions  and driving.  2. Lifestyle enhancement such as cigarette cessation for best outcome.  3. Maintain contact with primary care.  Will see her in follow-up.                                                   Rosann Auerbach, MD    HH/MEDQ  D:  01/22/2003  T:  01/22/2003  Job:  [id number]

## 2011-02-19 NOTE — Discharge Summary (Signed)
[place]  Patient:    Phyllis [name], Phyllis [name]                    MRN: [id number] Adm. Date:  [id number] Disc. Date: [id number] Attending:  Vincent Peyer Dictator:   Alfredia Ferguson, P.A. CC:         Christena Deem. Melvyn Novas, M.D. [facility]  Harden Mo, M.D.   Discharge Summary  ADMITTING DIAGNOSES: 32. A 55 year old female with acute relapsing pancreatitis in setting of    underlying chronic pancreatitis with prior documented main pancreatic duct    stricture. 2. Remote cholecystectomy. 3. Remote history of alcohol use. 4. Asthmatic bronchitis. 5. Hepatitis C with low viral load untreated. 6. Status post laminectomy and fusion in May 2000. 7. Anxiety. 8. 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  DISCHARGE DIAGNOSES: 1. Acute exacerbation of chronic pancreatitis, improving. 2. Chronic asthmatic bronchitis. 3. Hypokalemia, resolved. 4. Abnormal left lung on chest computed tomography with question of    peribronchial infiltrates.  CONSULTATIONS:  None.  PROCEDURES:  Computed tomography scan of the abdomen, pelvis and chest.  HISTORY OF PRESENT ILLNESS:  Phyllis [name] is a 55 year old, African-American female well-known to Dr. Delfin Edis and primary patient of Dr. Philipp Deputy with history of underlying chronic pancreatitis with relapsing acute episodes. She is admitted at this time with an acute episode with onset about 1 a.m. on the day of admission.  The patient has had several admissions for pancreatitis over the past few years.  She is status post cholecystectomy in 1994, and had ERCP in 1999.  She developed post-ERCP pancreatitis thereafter and was found to have a stricture at the main pancreatic duct at that time.  She was last admitted in April 2002, with an acute exacerbation.  CT scan was last done in July 2002, as an outpatient showing  interval clearing of the pancreatitis. There were findings suggestive of chronic pancreatitis with small calcifications in the pancreas.  She was also noted at that time to have a tubular structure in the left lung base with questioned vascular malformation versus malignancy and recommended followup CT scan in three months.  The patient says she has been doing well in the interim until acute onset of pain early in the morning hours on the day of admission.  This was associated with nausea and vomiting, no fevers or chills.  She was seen and evaluated in the emergency room by Dr. Delfin Edis and admitted for pain control and supportive management.  LABORATORY DATA AND X-RAY FINDINGS:  On admission, August 10, WBC was 10.3, hemoglobin 13.2, hematocrit 38.4, MCV 100.7, platelets 262.  Followup on August 11, with WBC 11, hemoglobin 13.3, hematocrit 39 with platelets 260.  On admission August 10, sodium was 137, potassium 3.1, BUN less than 5 and creatinine 0.5.  Follow up on August 11, with potassium 3.3.  On August 12, potassium was 4.3.  Albumin is 3.3.  Liver function studies normal with the exception of an SGOT slightly elevated at 43.  Amylase on admission was 204, lipase 505.  Followup on August 11, with amylase 152 and lipase 110.  Followup on August 12, with amylase 130 and lipase 40, both normal.  Urinalysis was negative.  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  There was patchy right upper lobe and lingular peribronchial infiltrates suspicious for bronchopneumonia and were possible that these were due to chronic scarring. The patient does have diffuse peribronchial thickening.  CT of the abdomen showed minimal stranding in the peripancreatic fat with trace amount of fluid in the left pericolic gutter compatible with recurrent  pancreatitis.  No focal fluid collection, splenic and portal veins are patent.  No ascites noted and otherwise unremarkable.  She does have a small left adnexal cyst probably a physiologic cyst of the left ovary.  Chest x-ray on August 10, on abdominal films, show nonobstructive bowel gas pattern and no evidence for acute cardiopulmonary disease.  HOSPITAL COURSE:  The patient was admitted to the service of Dr. Delfin Edis who was covering the hospital.  She was initially kept NPO and placed on IV fluids at 125 cc per hour.  She was put on a morphine PCA for pain control. She was continued on her inhalers and chronic medications from home.  She was also started on IV Rocephin.  She underwent CT scan of the abdomen and pelvis on August 12.  We included CT of the lung given prior finding of abnormal finding on CT one month ago.  The findings are outlined above.  The patient had a very benign hospital course.  She improved after a couple of days of bowel rest and we were able to gradually advance her diet without difficulty. Her hypokalemia resolved.  We had asked initially for pulmonary consultation, however, by the morning of August 14, the patient was feeling much better and was asking for discharge to home prior to being evaluated by pulmonary.  As she was otherwise stable, it was felt this was reasonable and she was discharged to home.  FOLLOWUP:  Instructed to follow up with Dr. Olevia Perches on September 11, at 10:40 a.m. and to call for any problems in the interim.  She was made an appointment with Dr. Melvyn Novas on August 21, at 11 a.m. for her asthmatic bronchitis.  DISCHARGE MEDICATIONS:  1. Singular 10 mg q.d.  2. Advair diskus 50/250 b.i.d.  3. Atrovent inhaler two puffs b.i.d.  4. Albuterol inhaler two puffs q.i.d.  5. Celexa 40 mg q.d.  6. Xanax 0.5 three times daily.  7. Prometrium 200 mg days 12-25.  8. Vicodin one every six hours as needed for pain.  9. Creon 10 two with each  meal.  10. Ceftin 250 mg b.i.d. x 7 days for possible bronchopneumonia.  CONDITION ON DISCHARGE:  Stable. DD:  05/17/01 TD:  05/17/01 Job: [id number]

## 2011-02-19 NOTE — Assessment & Plan Note (Signed)
Phyllis [name] returns to clinic today for followup evaluation. She reports that  she has been doing reasonably well with only slight increase in her  abdominal pain recently.  She has had no bouts of pancreatitis but did come  close a couple of times according to her reports.  When she feels that she  may be developing some pancreatic symptoms, she stops eating and drinks  fluids and also tries to decrease the stress that she has been having.  This  helps her avoid any full-blown crises.   The patient has been using her Avinza 60 mg twice a day.  She is  unfortunately only able to get a 21-day supply.  She has sufficient medicine  right now to cover her through approximately June 20 or 21st of this month.  We will give her a refill in the office today.   CURRENT MEDICATIONS:  1.  Avinza 60 mg q.12h. (21-day supply).  2.  Zyprexa 7.5 mg q.h.s.  3.  Prevacid 30 mg daily.  4.  Celebrex 40 mg daily.  5.  Xanax 0.5 mg two tablets daily p.r.n.  6.  Albuterol inhaler p.r.n.  7.  Advair Diskus inhaler p.r.n.   PHYSICAL EXAMINATION:  GENERAL:  Well-appearing, thin, adult female in no  acute distress.  VITAL SIGNS:  Blood pressure is 135/78 with a pulse of 71, respiratory rate  16, and O2 saturation 100% on room air.  NEUROLOGIC:  She has 5-/5 strength throughout the bilateral upper and lower  extremities.  Bulk and tone were normal and reflexes were 2+ and  symmetrical.  Sensation was intact to light touch throughout.   IMPRESSION:  1.  Chronic abdominal pain, secondary to pancreatitis.  2.  History of hepatitis C.   In the office today, we did refill her Avinza 60 mg q.12h., a total of 42  tablets to be filled as of March 22, 2005.  We will plan on seeing the  patient in followup in approximately three months' time.  She continues to  be very compliant with medications.  She does report that she is due to have  some dental work done and have asked her to have her dentist's office call  this  office to inform us if we need to any extra pain medicine to what she  is already taking.       DC/MedQ  D:  03/11/2005 09:40:41  T:  03/11/2005 10:52:15  Job #:  [id number]

## 2011-02-19 NOTE — Consult Note (Signed)
   NAME:  Phyllis [name], Phyllis [name] NO.:  [id number]   MEDICAL RECORD NO.:  [id number]                   PATIENT TYPE:  REC   LOCATION:  [facility]                                  FACILITY:  [facility]   PHYSICIAN:  Arlina Robes, DO                      DATE OF BIRTH:  Jan 18, 1956   DATE OF CONSULTATION:  12/10/2002  DATE OF DISCHARGE:                                   CONSULTATION   HISTORY OF PRESENT ILLNESS:  The patient returns to clinic today for  reevaluation.  She was last seen by Dr. Carlos Levering on November 20, 2002.  She  has chronic abdominal pain secondary to chronic pancreatitis.  Dr. Carlos Levering  changed her from Duragesic to Avinza and last visit.  She is currently  taking Avinza 60 mg daily and states it is helping her pain significantly.  She rates her pain as a 3/10 on a subjective scale today.  She brings her  empty pill bottle with her today.  She is only able to get a 21-day supply  on her current insurance and has run out.  I reviewed the health and history  form and 14-point review of systems.  Function and quality of life indices  remain stable and are improved overall.  Sleep is good.   PHYSICAL EXAMINATION:  GENERAL APPEARANCE:  A healthy female in no acute  distress.  Mood and affect are appropriate.  VITAL SIGNS:  Blood pressure 139/78, pulse 96, respiratory rate 20, O2  saturation 98% on room air.  ABDOMEN:  Soft with bowel sounds throughout.  There is no guarding, rebound  or rigidity. There is tenderness diffusely however.   IMPRESSION:  1. Chronic abdominal pain.  2. Chronic pancreatitis.  3. Hepatitis C.   PLAN:  1. Continue Avinza 60 mg one p.o. daily #21 without refills.  2. The patient is to call our clinic 72 hours prior to running out of     medications for refill x1 and then will follow up in six weeks.   The patient was educated on the above findings and recommendations and  understands.  There were no barriers to communication.                                             Arlina Robes, DO    JW/MEDQ  D:  12/10/2002  T:  12/11/2002  Job:  [id number]   cc:   Harden Mo, M.D.  [address]  [place]  [place] [address]  Fax: [phone]

## 2011-02-19 NOTE — Assessment & Plan Note (Signed)
Phyllis [name] returns to clinic today for follow-up evaluation.  She reports  that she continues to get good relief from the Avinza use 60 mg q.12h.  She  is only allowed a 21-day supply through her husband's insurance.  She is  considering moving out of state but wonders about how we would fill  prescriptions.  I have told her that she would have to set up care at an out  of state clinic.  She is having trouble finding work and apparently was  working at [employer] job in [place], [place], for approximately  one month.  She could not tolerate the activity, being up on her feet for a  prolonged period of time and had to stop that job.   MEDICATIONS:  1.  Avinza 60 mg q.12h.  2.  Zyprexa 7.5 mg nightly.  3.  Prevacid 30 mg daily.  4.  Celebrex 40 mg daily.  5.  Xanax 0.5 mg two tablets daily p.r.n.  6.  Albuterol inhaler p.r.n.  7.  Advair Diskus inhaler p.r.n.   REVIEW OF SYSTEMS:  Positive for weight gain, nausea, abdominal pain and  poor appetite.   PHYSICAL EXAMINATION:  GENERAL APPEARANCE:  A reasonably well-appearing thin  adult female in mild acute discomfort.  VITAL SIGNS:  Blood pressure 140/92 with pulse 73, respiratory rate 16 and  O2 saturation 98% on room air.  NEUROLOGIC:  She has 5-/5 strength throughout the bilateral upper and lower  extremities.  Sensation was intact to light touch throughout.  She ambulates  without any assistive device.   IMPRESSION:  1.  Chronic abdominal pain secondary to pancreatitis.  2.  History of hepatitis C.   In the office today, we did refill the patient's Avinza as of December 11, 2005.  Will plan on seeing her in follow-up at approximately two months'  time with refill medication prior to that appointment.  Again, she has been  told that if she moves out of state, she will have to set up care with an  out of state physician, especially for her narcotic medication.           ______________________________  Jarvis Morgan,  M.D.     DC/MedQ  D:  11/29/2005 10:30:21  T:  11/29/2005 15:35:15  Job #:  [id number]

## 2011-02-19 NOTE — Assessment & Plan Note (Signed)
Ms. Kopf returns to clinic today for followup evaluation. During the last  clinic visit January 03, 2004, we had increased her Avinza to 60 q.12h. from  q.24h. That is giving her excellent relief at the present time. She reports  that she gets better sleep when she takes that second pill in the evening  and also reports that she has less pain in the morning when she first wakes  up. She generally takes the first pill at 7:00 in the morning and the second  pill around 7 or 8 in the evening. 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 She reports  that she plans to still go back and finish out her associate degree, but she  has had to look for work at the present time. She is interested in getting  some type of work, earning some money, and then returning to the [employer]  education center to complete her degree.   MEDICATIONS:  1. Avinza 60 mg q.12h.  2. Zyprexa 7.5 mg q.h.s.  3. Prevacid 30 mg q.a.m.  4. Celexa 40 mg q.d.  5. Xanax 0.5 mg two tablets q.d. p.r.n.  6. Albuterol inhaler p.r.n.  7. Advair Diskus 250/75 one puff p.r.n.   PHYSICAL EXAMINATION:  Well appearing adult female in minimal acute  discomfort. Blood pressure 145/69 with a pulse of 84, respiratory rate 16,  and O2 saturation 98% on room air. She has strength 5-/5 throughout the  bilateral upper and lower extremities. Bulk and tone were normal, and  reflexes were 2+ and symmetrical. She ambulates without any assistive  device. She does complain of occasional pain in her mid epigastric region  anteriorly and in between her shoulder blades posteriorly.   IMPRESSION:  1. Chronic abdominal pain secondary to pancreatitis.  2. History of hepatitis C.   In the clinic today, I did refill her Avinza 60 mg one tablet p.o. q.12h. as  of today March 05, 2004. We will plan on seeing her in followup in  approximately two months' time. Unfortunately, her managed care policy  only  allows her a total of 42 tablets of the Avinza at time which unfortunately  only covers her for 21 days out of the month. She needs refills prior to  that month's appointment but is able to come in or send someone in to pick  up those prescriptions.   We will plan on see her in followup in approximately two months with refills  prior to that.      Jarvis Morgan, M.D.   DC/MedQ  D:  03/05/2004 11:06:46  T:  03/05/2004 12:57:49  Job #:  [id number]

## 2011-02-19 NOTE — Assessment & Plan Note (Signed)
HISTORY OF PRESENT ILLNESS:  Phyllis [name] returns to the clinic today for  follow up evaluation.  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  She is also trying to find part time work at the present  time.   In terms of her pain medication, she does take the Avinza 60 mg at 8 o'clock  in the morning.  This generally gives her good relief until approximately 5  p.m.  She did try some Advil but got minimal relief with this medication.  She reports that she has a fair amount of pain through the evening hours,  and even through the night until she takes her next dose of Avinza at 8  o'clock the next morning.  She would like some adjustment in her medication  for better pain control.  She continues to be followed for her pancreatitis  by Dr. Delfin Edis.  No admissions have taken place for the pancreatitis.  She reports that she needs to watch her diet carefully.   MEDICATIONS:  1. Avinza 60 mg daily.  2. Zyprexa 7.5 mg q.h.s.  3. Prevacid 30 mg daily.  4. Celexa 40 mg daily.  5. Xanax 0.5 mg two tablets daily p.r.n.  6. Albuterol inhaler p.r.n.  7. Advair Diskus 250/75 one puff p.r.n.   PHYSICAL EXAMINATION:  GENERAL APPEARANCE:  Well-appearing, adult female.  VITAL SIGNS:  Blood pressure 141/72 with pulse of 76 and O2 saturation 100%  on room air.  NEUROLOGICAL:  The patient has 5 minutes/5 strength throughout the bilateral  upper and lower extremities.  Bulk and tone were normal and reflexes were 2+  and symmetrical.   IMPRESSION:  1. Chronic abdominal pain secondary to pancreatitis.  2. History of hepatitis C.   PLAN:  The patient has minimal complaints of abdominal pain at the present  time, although, this is still within the period of time in which she is  getting benefit from the morning Avinza.  She does report is fairly severe  later in the afternoon, especially through the evening and night.  With that  in mind, we have increased her Avinza to 60 mg 1  p.o. q.12h.  This will be a  doubling of her dose and should give her relief throughout the day.  She  denies any constipation or significant grogginess on the Avinza at the  present time.  I have given her a prescription for the Avinza 60 mg 1 q.12h.  for a total of 42 that is written for January 06, 2004.  Her Medicaid only  allows her to have a 21 day supply at this point.   I plan on seeing the patient in follow up in approximately 2 months time.      Jarvis Morgan, M.D.   DC/MedQ  D:  01/03/2004 10:52:47  T:  01/03/2004 11:29:37  Job #:  [id number]

## 2011-02-19 NOTE — Assessment & Plan Note (Signed)
HISTORY:  Phyllis [name] returns to clinic today for followup evaluation.  She  reports that she has been doing well on her Avinza 60 mg used q.12h.  She  has not had any hospitalizations for any medical problems including  pancreatitis.  She is working on finding a part-time job at the present  time.  Her other medicines have stayed the same.   MEDICATIONS:  1. Avinza 60 mg q.12h.  2. Zyprexa 7.5 mg at bedtime.  3. Prevacid 30 mg every morning.  4. Celexa 40 mg once daily.  5. Xanax 0.5 mg two tablets once daily p.r.n.  6. Albuterol inhaler p.r.n.  7. Advair Diskus 250/75 one puff p.r.n.   PHYSICAL EXAMINATION:  Well-appearing thin adult female.  Blood pressure  151/89 with a pulse of 83, respiratory rate 20 and O2 saturation 98% on room  air.  She has 5-/5 strength throughout bilateral upper and lower  extremities.  She ambulates without any assistance device.  Reflexes are 2+  and symmetrical and sensation is intact to light touch throughout.   IMPRESSION:  1. Chronic abdominal pain secondary to pancreatitis.  2. History of hepatitis C.   I have refilled the patient's Avinza at 60 mg one tablet q.12h. a total of  42 as of today, May 04, 2004.  Unfortunately, she is only able to obtain a  21-day supply but will that as necessary.  We will plan on seeing the  patient in followup in approximately 2 months' time.      Jarvis Morgan, M.D.   DC/MedQ  D:  05/04/2004 10:23:11  T:  05/04/2004 11:27:22  Job #:  [id number]

## 2011-02-19 NOTE — Assessment & Plan Note (Signed)
INTERVAL HISTORY:  Phyllis [name] returns to clinic today for followup  evaluation.  She reports that with the change in weather recently she has  had some chills and is wondering if she is developing a cold.  She has noted  no fevers but just feels achy overall.  That has corresponded to an increase  in her pain level.  She continues to get good relief at her baseline taking  the Avinza 60 mg twice a day.  She reports that she continues to watch her  diet and tries to avoid foods that might increase her pancreatitis.  She has  had no recent hospitalizations and in fact Dr. Olevia Perches had told her that  recent blood work looked good.   The patient reports that she is still trying to find part-time work.  She  reports that she has signed up for vocational rehabilitation but has not  heard back from them if she is eligible.  She does have a high school  education with 1 year of college.  She quit college when finances became  difficult.   MEDICATIONS:  1.  Avinza 60 mg q.12h.  2.  Zyprexa 7.5 mg at bedtime.  3.  Prevacid 30 mg every morning.  4.  Celexa 40 mg once daily.  5.  Xanax 0.5 mg two tablets once daily p.r.n.  6.  Albuterol inhaler p.r.n.  7.  Advair Diskus 250/75 one puff p.r.n.   PHYSICAL EXAMINATION:  Ill-appearing thin adult female in mild acute  discomfort.  Blood pressure 170/90 with a pulse of 86, respiratory rate 16,  and O2 saturation 100% on room air.  She had 5-/5 strength throughout the  bilateral upper and lower extremities.  Bulk and tone were normal.  She was  able to ambulate without any assistive device.  Reflexes were 2+ and  symmetrical and sensation was intact to light touch throughout.  Abdominal  area showed mild tenderness to pain on palpation with good bowel sounds.   IMPRESSION:  1.  Chronic abdominal pain secondary to pancreatitis.  2.  History of hepatitis C.   In the clinic today, her count on her Avinza is accurate.  We recently  filled that June 29, 2004.  We will fill that at the end of October  when it is next due.  In the meantime, she will continue on that medication  and continue her job search to see if possible work can be found or she can  be approved for vocational rehabilitation.  We will plan on seeing her in  followup in 2 months' time.       DC/MedQ  D:  07/06/2004 10:15:09  T:  07/06/2004 11:21:56  Job #:  [id number]

## 2011-02-19 NOTE — H&P (Signed)
NAMETHEREASA, IANNELLO NO.:  [id number]   MEDICAL RECORD NO.:  [id number]                   PATIENT TYPE:  INP   LOCATION:  1245                                 FACILITY:  [facility]   PHYSICIAN:  Delfin Edis, M.D. [facility]               DATE OF BIRTH:  09/12/1956   DATE OF ADMISSION:  05/04/2003  DATE OF DISCHARGE:                                HISTORY & PHYSICAL   CHIEF COMPLAINT:  Refractory upper abdominal pain.   HISTORY OF PRESENT ILLNESS:  Ms. Decook is a 55 year old African-American  female with a history of chronic pancreatitis which has been relapsing in  the past requiring hospitalization, but she has not required admission for  acute exacerbation of pancreatitis-related symptoms since 2002.  She had an  ERCP in 1999 by Dr. 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  There was also  stricturing in the common bile duct, but no significant proximal common duct  dilatation.  As far as I can tell from reading the record, she has not had  any interventions to these strictures.  Gallbladder came out in 1994, and  she has had other surgeries listed below.  A hepatitis C antibody is  positive, but viral load has been negligible, and she has not undergone any  treatment for hepatitis C.  She remotely abused alcohol and more remotely  used IV drugs, but these have been inactive for many years.  She had post-  ERCP pancreatitis in 1999.  She does have chronic mid abdominal pain which  is managed by the pain clinic at [facility].  Previously she was on  a Duragesic patch, but this became less and less effective and, therefore,  she was changed over to Avinza, which is a long-acting morphine compound  base dosed once a day.  She started using this about three months ago.  Initially there was fairly effective, but she has been having breakthrough  pain later in the day and has no  available p.r.n. shorter-acting narcotics.  In the last two weeks the pain has become increasingly a problem, and it is  located in her right upper quadrant, radiates to the back, sometimes  radiates up into the right chest and across the upper abdomen.  It is  associated with some nausea but no vomiting.  Solid food definitely makes it  worse so in the past several days she has been sticking to a liquid diet.  The pain became intolerable so she came over to the emergency room for  evaluation and was admitted by Dr. Delfin Edis for pain management and  further evaluation.  Her amylase was normal, lipase was minimally elevated.  Transaminases were also minimally elevated.  All laboratories are outlined  below.   PAST MEDICAL HISTORY:  1. Status post cholecystectomy in 1994.  2. Chronic relapsing pancreatitis with latest admission in 2002.  3. Status post bilateral tubal ligation.  4. Status post hemorrhoidectomy.  5. History of hepatitis C, not treated.  6. Remote history of alcohol abuse, none for the past six or seven years.  7. More remote IV drug use, none for about 16 years.  8. Status post lower spine disk surgery in the early 1990s.  One of these     surgeries required an abdominal approach with scar visible in the lower     abdomen.  Records indicate that she had a laminectomy infusion in May     2000.  9. She is also status post some sort of ENT surgery, a benign lesion,     possibly near or at the vocal cords because she has a chronic hoarseness.  10.      Asthmatic bronchitis.  11.      Anxiety.  12.      Abnormal left lung on CT in July 2002.  Rule out vascular     malformation, rule out peribronchial infiltrate.   ALLERGIES:  NAPROSYN, which causes GI intolerance.   CURRENT MEDICATIONS:  1. Prevacid 30 mg daily.  2. Zyprexa 7.5 mg at h.s.  3. Advair Diskus inhaler 2 puffs every morning.  4. Albuterol nebulizers up to four times daily p.r.n.  5. Avinza 60 mg daily.   6. Alprostadil, dose unknown, and question whether she is using this.  7. Alprazolam 0.5 mg, 1/2-1 p.o. p.r.n. up to three times daily.   SOCIAL HISTORY:  The patient is married.  She is not currently working.  She  is attending classes at [employer].  She lives  with her husband in [place].  They have three grown children.  Smokes  approximately one pack per day.  Has not used alcohol or illicit drugs in  many years.   REVIEW OF SYSTEMS:  GENERAL:  She has lost about five pounds over the last  few weeks because of decreased p.o. intake.  ENDOCRINE:  No excessive thirst.  No sweats or chills.  NEUROLOGIC:  No  headaches, no numbness or tingling of extremities.  No history of strokes or  ministrokes.  PULMONARY:  Chronic, clear productive cough.  No resting  dyspnea.  No PND.  Has not had any acute flares of asthma requiring  albuterol recently.  CARDIOVASCULAR:  No extremity edema, no palpitations.  No angina.  DERMATOLOGIC:  No rashes.  No nonhealing sores.  HEMATOLOGIC:  No problems with aggressive bleeding.  ENT:  Has a chronic hoarseness since  she had ENT surgery.  UROLOGIC:  Has had some difficulty voiding with  decreased stream but no frequency and no dysuria.  No hematuria.  MUSCULOSKELETAL:  Denies significant back pain other than the abdominal pain  radiating into the right side of the thoracic spine and scapula.   PHYSICAL EXAMINATION:  GENERAL:  The patient is a pleasant, not acutely ill-  appearing African-American female.  She is in mild discomfort.  She is alert  and oriented x3.  VITAL SIGNS:  Blood pressure 155/90, pulse is 91, respirations 16,  temperature 98.7.  Weight is 130 pounds.  HEENT:  Sclerae are nonicteric.  Conjunctivae pink.  Oropharynx:  The mucous  membranes are moist.  The tongue is nonspecifically coated with a whitish  exudate.  Pharynx is clear.  NECK:  There is no JVD, masses, or bruits. COR:  There is regular rate  and rhythm.  No murmurs, rubs, or gallops.  CHEST:  There are some wheezes on inspiration and expiration, and she has a  loose-sounding cough.  No dyspnea with speech.  ABDOMEN:  Very tender in the upper abdomen from the left to the right side  including the epigastrium.  No costovertebral angle tenderness.  Bowel  sounds are active.  The liver edge is palpable at the costal margin.  Abdomen is nondistended.  Low transverse scar evident just to the left of  midline.  EXTREMITIES:  No cyanosis, clubbing, or edema.  RECTAL, GENITOURINARY:  Not performed.  DERMATOLOGIC:  No rashes.  No lesions.  HEMATOLOGIC:  No significant bruising evident.  No adenopathy.   LABORATORIES:  Hemoglobin is 14, hematocrit is 42, MCV 93.5, white blood  cell count 6.3, with normal differential, and platelets are 167,000.  Sodium  140, potassium 4, BUN is 1, creatinine 0.7, glucose is 90.  Total bilirubin  0.6, alkaline phosphatase 61, AST 68, ALT 49, albumin is 3.3, amylase is 75,  and lipase is 57.  Urinalysis is negative, with clear appearance and normal  specific gravity.   IMPRESSION:  1. Chronic calcific pancreatitis.  2. Progressive worsening of chronic abdominal pain, now refractory to the     usual pain management of long-acting high-dose narcotics.  Rule out acute     pancreatitis, rule out pain simply secondary to acute exacerbation of     chronic pain.  3. 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  Rule out     critical stricturing leading to acute pain.  4. Positive hepatitis C antibody, now with slight increase in transaminases,     previously had a low viral count.  She is status post remote liver     biopsy.  5. Chronic smoker in a patient who has asthma.  6. Anxiety which is not acutely worsened.   PLAN:  1. Bowel rest.  2. Will add IV analgesics for pain control.  3. Plan to get an MRCP to reevaluate pancreatic and common bile duct     stricturing.  4. Continue  home medications.     Azucena Freed, P.A. [facility]                   Delfin Edis, M.D. [facility]    SG/MEDQ  D:  05/05/2003  T:  05/05/2003  Job:  [id number]

## 2011-02-19 NOTE — Assessment & Plan Note (Signed)
Phyllis [name] returns to clinic today for followup evaluation.  She reports that  she was doing reasonably well on the Avinza 60 mg daily.  She was able to  attend school at [employer] where she is studying office Technical bre[employer].  She  has been unable to find any part-time work at the present time.  She has had  no recent hospitalizations for acute pancreatitis and no hospitalizations of  any kind.  She reports that her pain is generally in the 3 to 4 range  overall.   REVIEW OF SYSTEMS:  Positive for anxiety and depression along with nausea  and abdominal pain periodically.   MEDICATIONS:  1. Avinza 60 mg daily.  2. Zyprexa 7.5 mg 1 tablet q.h.s.  3. Prevacid 30 mg daily.  4. Celexa 40 mg daily.  5. Xanax 0.5 mg 2 tablets daily p.r.n.  6. Albuterol metered dose inhaler p.r.n.  7. Advair Diskus p.r.n.   PHYSICAL EXAMINATION:  GENERAL:  Well-appearing adult female.  VITAL SIGNS:  Blood pressure 125/67 with a pulse of 88.  Her O2 saturation  is 97% on room air.  NEUROLOGIC:  She has strength of 5-/5 throughout the bilateral upper and  lower extremities.  Bulk and tone were normal, and reflexes were 2+ and  symmetrical.  Sensation was intact to light touch throughout the bilateral  upper and lower extremities.  ABDOMEN:  Intact bowel sounds with no particular tenderness to palpation.   IMPRESSION:  1. Chronic abdominal pain secondary to pancreatitis.  2. History of hepatitis C.   In the clinic today, she did have accurate pill count.  I have refilled her  Avinza as of November 14, 2003, a total of 21 tablets as this is all that  she is able to get filled at one time.  I will plan on seeing the patient in  followup in two months' time, but will refill medications as necessary prior  to that appointment.      Jarvis Morgan, M.D.   DC/MedQ  D:  11/04/2003 10:36:40  T:  11/04/2003 11:21:14  Job #:  [id number]

## 2011-02-19 NOTE — Assessment & Plan Note (Signed)
Phyllis [name] returns to clinic today for followup evaluation.  She reports  that she is doing well overall.  She does need a refill on her tramadol  as she is completely out at this time.  She generally uses three per  day.  She continues to use her Avinza 60 mg twice a day and needs a  refill over the next few days.  Her pill count is intact.  She is  allowed only a 21 day supply by her insurance carrier on the Avinza.  Her other medicines have stayed the same.  She reports that she has had  some increased pains of her abdomen lately after liver biopsy done in  January 2008.   MEDICATIONS:  1. Avinza 60 mg every 12 hours.  2. Zyprexa 7.5 mg nightly.  3. Prevacid 30 mg nightly.  4. Celexa 40 mg nightly.  5. Xanax 0.5 mg one tablet t.i.d. p.r.n.  6. Albuterol inhaler p.r.n.  7. Advair Diskus p.r.n.  8. Tramadol 50 mg t.i.d. p.r.n.   REVIEW OF SYSTEMS:  Positive for nausea, vomiting, diarrhea, poor  appetite, abdominal pain.   PHYSICAL EXAMINATION:  GENERAL:  Well-appearing, thin, adult female in  mild to no acute discomfort.  VITAL SIGNS:  Blood pressure 156/89 with pulse of 89, respiratory rate  18, O2 saturations 98 percent on room air.  She has 5-/5 strength  throughout.  Bulk and tone were normal.  She ambulates without any  assistive device.   IMPRESSION:  1. Chronic abdominal pain secondary to chronic pancreatitis.  2. History of hepatitis C.   In the office today we did refill the patient's tramadol as of today  with three refills.  We also refilled her Avinza as of December 22, 2006.  She is very compliant with medication administration without any signs  of diversion.  She gets good analgesic effect overall using the two  medicines.  We will plan on seeing her in follow-up in approximately  four months' time with refills prior to that appointment as necessary.           ______________________________  Jarvis Morgan, M.D.     DC/MedQ  D:  12/14/2006 09:21:55  T:   12/15/2006 21:44:56  Job #:  [id number]

## 2011-02-19 NOTE — H&P (Signed)
[facility]  Patient:    Phyllis [name], Phyllis [name]                      MRN: [id number] Adm. Date:  [id number] Disc. Date: [id number] Attending:  Vincent [name] Dictator:   Phyllis [name], P.A.-C. CC:         Phyllis [name], M.D.   History and Physical  CHIEF COMPLAINT:  Acute abdominal pain, nausea and vomiting.  HISTORY OF PRESENT ILLNESS:  Phyllis [name] is a 55 year old African-American female, known to Phyllis [name], primary patient of Phyllis [name], who has a history of relapsing pancreatitis.  She is admitted at this time with an acute episode, onset about 1 a.m. on the day of admission.  The patient does have a history of probable underlying chronic pancreatitis and several admissions for pancreatitis over the past few years.  She is status post cholecystectomy in 1994, had an ERCP in 1999, and developed post-ERCP thereafter.  This did show a stricture of the main pancreatic duct.  Her last hospitalization was in April of 2002, with an acute episode of pancreatitis. Last CT scan was done April 13, 2001, showed interval clearing of the pancreatitis.  There were findings suggestive of chronic pancreatitis with small calcifications.  She was also noted to have a tubular structure in the left lung base, question vascular malformation versus malignancy and recommended follow-up in three months.  The patient says she has been doing well in the interim until the acute onset of pain early in the morning hours on the day of admission.  This was associated with nausea and a couple of episodes of vomiting.  No fever or chills.  She has been eating high-fat by her recollection.  Denies any alcohol use over the past four years.  The patient was seen and evaluated in the emergency room by Phyllis [name] and admitted to the hospital with a recurrent episode of pancreatitis for supportive management.  CURRENT MEDICATIONS: 1. Singulair 10 mg q.d. 2. Advair Disk  50/250 q.d. 3. Atrovent 2 puffs q.d. 4. Albuterol 2 puffs q.i.d. 5. Celexa 80 mg q.d. 6. Xanax 0.5 t.i.d. p.r.n. 7. Prometrium 100 mg 2 tablets on days 12-25. 8. Vicodin p.r.n.  ALLERGIES:  No known drug allergies.  PAST MEDICAL HISTORY: 1. Status post cholecystectomy in 1994. 2. Bilateral tubal ligation. 3. Remote hemorrhoidectomy. 4. Laminectomy and fusion lumbar, May 2000. 5. Hepatitis C with low viral load, less than 600. 6. Hospitalized 1999 with acute pancreatitis post-ERCP.  ERCP at that time did    show a main pancreatic duct stricture. 7. Asthmatic bronchitis.  FAMILY HISTORY:  Pertinent for gallbladder disease in the patients grandmother who died apparently secondary to perforated gallbladder, and one aunt and two siblings all status post cholecystectomy.  SOCIAL HISTORY:  The patient is married, has one teenage child.  She is not employed.  She is a smoker, 1/2 pack-per-day.  Denies any ETOH use over the past four years.  REVIEW OF SYSTEMS:  CARDIOVASCULAR:  Denies any chest pain or anginal symptoms.  PULMONARY:  Denies any current cough, sputum production.  She does have a history of asthmatic bronchitis which is fairly stable presently. GI:  As outlined above.  PHYSICAL EXAMINATION:  Per Phyllis [name].  GENERAL:  The patient is a well-developed African-American female in distress secondary to pain at the time of admission and drowsy post-Phyllis [name].  VITAL SIGNS:  Blood pressure 141/95, pulse 102, respirations 20, temperature 100.  Skin is warm and dry.  HEENT:  Atraumatic, normocephalic.  EOMI, PERRLA.  Sclerae anicteric.  NECK:  Supple.  No nodes.  CARDIOVASCULAR:  Regular rate and rhythm, slightly tachycardic with no murmur, rub or gallop.  PULMONARY:  A few expiratory wheezes in the right lung and left lung base with a few rales.  ABDOMEN:  Protuberant, very tender to light palpation with guarding across the upper abdomen, particularly in the left upper  quadrant.  No palpable hepatosplenomegaly.  Bowel sounds are active.  RECTAL:  Not done at the time of admission.  EXTREMITIES:  No clubbing, cyanosis, or edema.  LABORATORY STUDIES:  On admission, WBC 10.3, hemoglobin 13.2, hematocrit 38.4, MCV 100.7, platelets 262.  Sodium 137, potassium 3.1, BUN less than 5, creatinine 0.5, LFTs normal with the exception of an SGOT of 43, calcium 8.2, albumin 3.3, amylase 204, lipase 500 range.  IMPRESSION: 1. A 55 year old African-American female with acute relapsing pancreatitis,    underlying chronic pancreatitis with prior documented main pancreatic duct    stricture. 2. Remote cholecystectomy. 3. Remote history of alcohol use. 4. Asthmatic bronchitis. 5. Hepatitis C with low viral load, untreated. 6. Status post laminectomy and fusion, May 2000. 7. Anxiety. 8. Abnormal computed tomography scan of the left lung, July 2002.  See history    and physical for details.  PLAN: 1. The patient is admitted to the service of Phyllis [name] for IV fluid hydration, pain control.  She will be started on a morphine PCA and be kept at complete bowel rest.  Will continue her home medications, cover her with Rocephin IV.  Will plan follow-up CT scan of the abdomen and pelvis.  For further details, please see the orders. DD:  05/14/01 TD:  05/14/01 Job: [id number]

## 2011-02-19 NOTE — Discharge Summary (Signed)
[place]  Patient:    Phyllis [name], Phyllis [name]                    MRN: [id number] Adm. Date:  [id number] Disc. Date: [id number] Attending:  Ashok Pall                           Discharge Summary  ADMISSION DIAGNOSIS:  Degenerative disk disease L5-S1.  DISCHARGE DIAGNOSIS:  Degenerative disk disease L5-S1.  COMPLICATIONS:  None.  CONDITION ON DISCHARGE:  Alive and well.  HISTORY OF PRESENT ILLNESS:  Phyllis [name] is a patient of mine who underwent an L5-S1 semihemilaminectomy and diskectomy in December.  She did not have significant improvement with that.  We, therefore, went ahead and recommended a lumbosacral fusion using an anterior approach at L5-S1 with Ray threaded cages.  HOSPITAL COURSE:  Phyllis [name] was admitted and taken to the operating room on January 18, 2000, and she had an uncomplicated procedure.  The approach was done by Drs. Gerkin and Cheraw, and the fusion was performed by myself. Phyllis [name] postoperatively had good relief of pain in the lower back and lower extremities.  She was tolerating a regular diet, ambulating, and voiding.  Her incision clean and dry, without signs of infection.  Full strength in the lower extremities.  Given instructions of no heavy lifting, bending, or twisting.  Given OxyContin for pain medication postoperatively.  I will see her back in the office in about three weeks. DD:  02/05/00 TD:  02/08/00 Job: [id number]

## 2011-02-19 NOTE — Assessment & Plan Note (Signed)
HISTORY:  The patient returns to the clinic today for a follow-up  evaluation.  She reports that overall she gets good relief with the Avinza  60 mg, one tab q.12h.  She is only allowed a 21-day supply from the  pharmacy, according to her insurance carrier.  She still reports pain across  her upper abdomen, and pain between her shoulder blades posteriorly.  She  has been back to see Dr. Delfin Edis, her GI specialist, and nothing new has  changed.  She reports that her primary care physician, Dr. Harden Mo,  has noticed a breast lump, and she is scheduled for a biopsy.  She has  questions regarding pain medicines if the biopsy is needed, and whether  those can be added to the present medicines.   CURRENT MEDICATIONS:  1.  Avinza 60 mg q.12h.  2.  Zyprexa 7.5 mg q.h.s.  3.  Prevacid 30 mg daily.  4.  Celexa 40 mg daily.  5.  Xanax 0.5 mg, two tab daily p.r.n.  6.  Albuterol inhaler p.r.n.  7.  Advair Diskus inhaler p.r.n.   PHYSICAL EXAMINATION:  GENERAL:  A thin adult female, in no acute  discomfort.  VITAL SIGNS:  Blood pressure 145/91, pulse 114, O2 saturation 98% on room  air.  NEUROLOGIC:  She has 5-/5 strength throughout the bilateral upper and lower  extremities.  On palpation, there was mild tenderness in the upper gastric  region on the left.  Reflexes were 2+ and symmetrical throughout her  bilateral upper and lower extremities.  Sensation was intact to light touch  throughout.   IMPRESSION:  1.  Chronic abdominal pain, secondary to pancreatitis.  2.  History of hepatitis-C.   DISPOSITION:  In the clinic today I did refill her Avinza as of September 14, 2004.  I told her that if Dr. Jake Michaelis needs to add any extra pain medications  after her biopsy, that he just needs to let this office know.  She also  needs to make sure that he is aware that she is already on the Avinza at 60  mg b.i.d.  Will plan on seeing the patient in followup in this office in  approximately  three months' time with refills prior to that appointment if needed.       DC/MedQ  D:  09/14/2004 11:25:22  T:  09/14/2004 11:38:06  Job #:  [id number]

## 2011-02-19 NOTE — Assessment & Plan Note (Signed)
Phyllis [name] returns to clinic today for followup evaluation.  She reports that  Dr. Olevia Perches has referred her to a specialist possibly for her pancreas or  possibly her liver.  She is not sure of the exact name of that physician but  she is sure that she sees that person August 23, 2006.  The patient  reports that she has been doing well on her Avinza 60 mg q. 12 hours.  She  does need a refill on that in the office today.  She does take her Tramadol  3 times a day and needs a refill there.  She reports that [facility] has been managing her Xanax.  Unfortunately, she is not able to get  in to see them and needs a refill on the Xanax at this time.  We will give  her a refill on that 1 time until she can get back in with the behavioral  health people.   MEDICATIONS:  1. Avinza 60 mg q. 12 hours.  2. Zyprexa 7.5 mg q. h.s.  3. Prevacid 30 mg daily.  4. Celexa 40 mg daily.  5. Xanax 0.5 1 tablet t.i.d. p.r.n.  6. Albuterol inhaler p.r.n.  7. Advair Diskus inhaler p.r.n.  8. Tramadol 50 mg t.i.d. p.r.n.   REVIEW OF SYSTEMS:  Positive for weight loss, nausea, abdominal pain and  poor appetite.   PHYSICAL EXAMINATION:  GENERAL:  Relatively well-appearing, thin, adult  female in mild to no acute discomfort.  VITAL SIGNS:  Blood pressure: 148/92, pulse 82, respiratory rate 16, O2  saturation  97% on room air.  She has 5/5 strength in bilateral upper and lower extremities.  She  ambulates without any assistive device.   IMPRESSION:  1. Chronic abdominal pain secondary to chronic pancreatitis.  2. History of hepatitis C.   In the office today we did refill the patient's Ultram and also gave her  prescription for the Xanax until she can get back in with [facility].  She does not need a refill on the Avinza but we will call in for  one and come and pick up the prescription over the next several days.  We  will plan on seeing her in followup in approximately 3 to 4  months time with  refills prior to that appointment if necessary. She continues to get good  analgesic effect without any signs of aversion or significant adverse side  effects.           ______________________________  Jarvis Morgan, M.D.     DC/MedQ  D:  08/17/2006 12:55:55  T:  08/17/2006 13:48:10  Job #:  [id number]

## 2011-02-19 NOTE — Consult Note (Signed)
NAME:  Phyllis [name], Phyllis [name]                         ACCOUNT NO.:  [id number]   MEDICAL RECORD NO.:  [id number]                   PATIENT TYPE:  REC   LOCATION:  [facility]PC                                  FACILITY:  [facility]   PHYSICIAN:  Hans C. Carlos Levering, M.D.                DATE OF BIRTH:  1955-10-11   DATE OF CONSULTATION:  DATE OF DISCHARGE:                                   CONSULTATION   REASON FOR CONSULTATION:  [facility].  Evaluate her health history forms and 14-point review of  systems.   BRIEF SUMMARY:  1. The patient seems to be fairly stable on current medication regimen and     pharmokinetically long-acting  medications that seem to help her.  She is     requesting a breakthrough medication but I cannot really justify this as     we do not see a decline in symptoms, presentation, or symptoms today and     her functional indices remained stable.  She is instructed to take an     occasional Advil or something along these lines, and I cautioned to her     as potential hepatic issues in this regard.  2. She brings her medications in and is appropriate.  3. Our review patient care agreement.  4. She understands the concept of potential random MDSU, except it with full     informed consent.  5. Her hepatitis is stable.  I do not believe further diagnostics or imaging     is warranted.   PHYSICAL EXAMINATION:  ABDOMEN:  Objectively, abdominal exam is  unremarkable.  No advancing hepatosplenomegaly.  Soft and nontender exam.  NEUROLOGICAL:  Otherwise myofacial component in the paralumbar region but no  neurological findings in motor, sensory reflexes.   IMPRESSION:  1. Hepatitic C.  2. Chronic pancreatitis.  3. Stable presentation.   PLAN:  Conservative management.  Review of habituating nature of these  medications, potential risks of these medications, and she understands  caution as to driving cars, operating machinery, or possibly  making  important decisions in her life issues.  We will see her in follow-up.                                               Hans C. Carlos Levering, M.D.    [facility]  D:  08/28/2002  T:  08/28/2002  Job:  [id number]

## 2011-02-19 NOTE — Consult Note (Signed)
NAME:  Phyllis [name], Phyllis [name] NO.:  [id number]   MEDICAL RECORD NO.:  [id number]                   PATIENT TYPE:  REC   LOCATION:  TPC                                  FACILITY:  [facility]   PHYSICIAN:  Arlina Robes, DO                      DATE OF BIRTH:  1956-03-17   DATE OF CONSULTATION:  10/26/2002  DATE OF DISCHARGE:                                   CONSULTATION   HISTORY OF PRESENT ILLNESS:  The patient returns to the clinic today for  reevaluation.  She is a patient of Dr. Ephriam Knuckles who was last seen on  August 28, 2002, for chronic abdominal pain secondary to chronic  pancreatitis.  The patient states that the Duragesic patch is helping to  some degree, but she describes breakthrough pain on occasion.  She was  apparently at one of her other providers' office yesterday, and it was  recommended that she take medication for breakthrough pain.  The patient  states that she refused any medications secondary to our controlled-  substance agreement.  Apparently, the patient's primary care Phyllis [name] had  discussed the case with Dr. Carlos Levering yesterday in this regard.  In addition to  her upper abdominal pain she complains of pain between her shoulder blades  as well as her right flank.  Her pain ranges between 3 and 8/10 on a  subjective scale.  Prior to coming to our clinic she was on Duragesic 25 mcg  q.72h. as well as Vicodin for breakthrough pain which she states she took  sparingly, but her pain was better controlled at that time.  I reviewed the  health and history form and 14-point review of systems.  Her functional and  quality of life indices have declined somewhat over the past several weeks,  but she wants to go back to school this summer.  She had had a urine drug  screen on July 30, 2002, which was positive for opiates and  benzodiazepines as expected.  She brings her used patches with her today as  well as one remaining unused patch, which is  appropriate.   PHYSICAL EXAMINATION:  GENERAL:  Healthy-appearing female in no acute  distress.  VITAL SIGNS:  Blood pressure 117/55, pulse 95, respirations 16, O2  saturation 97% on room air.  EXTREMITIES:  Range of motion of the upper extremities is full bilaterally.  There is tenderness to palpation diffusely in the periscapular muscles  bilaterally.  ABDOMEN:  Positive bowel sounds.  Abdomen is soft, without guarding,  rebound, or rigidity.  There is significant tenderness bilateral upper  quadrants.   IMPRESSION:  1. Chronic abdominal pain.  2. Chronic pancreatitis.  3. Hepatitis C.  4. History of depression.   PLAN:  1. Continue Duragesic 25 mcg q.72h.  I have written two prescriptions for #5     each, as the patient's insurance will  only pain for medications up to 21     days at a time.  2. Discussed breakthrough pain with the patient, and it is reasonable at     this time to resume medication for breakthrough pain and will prescribe     Norco 10 mg/325 mg, 1/2 to 1 p.o. b.i.d. as needed for breakthrough pain.     I have written a prescription for #42 without refills.  3. Patient to return to clinic in one month for reevaluation by Dr. Carlos Levering.   The patient was educated on the above findings and recommendations and  understands.  There were no barriers to communication.                                               Arlina Robes, DO    JW/MEDQ  D:  10/26/2002  T:  10/26/2002  Job:  [id number]   cc:   Harden Mo, M.D.  [address]  [place]  [place] [address]  Fax: [phone]

## 2011-02-19 NOTE — Assessment & Plan Note (Signed)
HISTORY OF PRESENT ILLNESS:  Ms. Dlouhy returns to the clinic today for  follow up evaluation.  We last saw her in this office March 11, 2005.  She has  been doing well on her Avinza 60 mg one tablet q.12h.  She is only allowed a  42 day supply from her husbands insurance.  She has had all of her teeth  pulled since the last clinic visit secondary to pyuria.  She is planning to  get dentures within the next couple of weeks.  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  She has had no  hospitalizations recently for pancreatitis.   MEDICATIONS:  1.  Avinza 60 mg one tablet q.12 h (21 day supply).  2.  Zyprexa 7.5 mg q.h.s.  3.  Prevacid 30 mg daily.  4.  Celebrex 40 mg daily.  5.  Xanax 0.5 mg two tablets daily p.r.n.  6.  Albuterol inhaler p.r.n.  7.  Advair discus inhaler p.r.n.   REVIEW OF SYSTEMS:  Noncontributory.   PHYSICAL EXAMINATION:  GENERAL APPEARANCE:  Well-appearing thin adult female  in no acute distress.  VITAL SIGNS:  Blood pressure 155/89, pulse 80, respirations 16, O2  saturation 100% on room air.  EXTREMITIES:  She has 5-/5 strength throughout the bilateral upper and lower  extremities.  Bulk and tone were normal.  Reflexes were 2+ and symmetrical.  She ambulates without any assisted device.   IMPRESSION:  1.  Chronic abdominal pain secondary to pancreatitis.  2.  History of hepatitis C.   PLAN:  In the office today, we did refill the patient's Avinza as of  tomorrow, June 05, 2005.  She has supply that will last her until  approximately three days from now, and that will allow her adequate overlap  considering Monday is a holiday.  We will plan on seeing the patient in  follow up in approximately three months time.           ______________________________  Jarvis Morgan, M.D.     DC/MedQ  D:  06/04/2005 10:34:57  T:  06/04/2005 11:15:37  Job #:  [id number]

## 2011-02-19 NOTE — Discharge Summary (Signed)
[facility]  Patient:    Phyllis [name], Phyllis [name]                      MRN: [id number] Adm. Date:  [id number] Disc. Date: [id number] Attending:  Vincent Peyer Dictator:   Nicoletta Ba, P.A.-C. CC:         Pixie Casino, M.D.  Derrill Kay, M.D.   Discharge Summary  ADMITTING DIAGNOSES: 91. A 55 year old female with chronic relapsing pancreatitis with recurrent    pancreatitis refractory to outpatient management. 2. Status post remote cholecystectomy. 3. Hepatitis C. 4. Asthmatic bronchitis. 5. Chronic anxiety. 6. Hyponatremia and hypokalemia.  DISCHARGE DIAGNOSES: 1. Acute exacerbation of pancreatitis, resolving. 2. Significant hyponatremia, corrected. 3. Hypokalemia, corrected. 4. Atelectasis and bronchospasm secondary to asthmatic bronchitis and chronic    obstructive pulmonary disease, symptomatically improved. 5. Other diagnoses as listed above.  CONSULTATIONS: 1. Dr. Patsey Berthold, pulmonary. 2. Dr. Jenean Lindau, endocrinologist.  PROCEDURES:  CT scan of the abdomen and pelvis and MRCP.  BRIEF HISTORY:  Phyllis [name] is a 55 year old African-American female, known to Dr. Delfin Edis, primary patient of Dr. Jake Michaelis, with history of chronic pancreatitis.  She had undergo prior ERCP in 1999 per Dr. Delfin Edis, showing a stricture of the main pancreatic duct at the junction of the body and head of the pancreas and also a common bile duct stricture without significant proximal dilation.  She is status post remote cholecystectomy and has had bilateral tubal ligation.  She is also hepatitis C positive and has not been treated as yet.  She does have a history of remote alcohol use, apparently none over the past four years.  The patient was last hospitalized in 1999, at which time she had developed postERCP pancreatitis.  She states that she has been doing fairly well over the past couple of years but has had some milder bouts of abdominal pain which have  not required hospitalization.  More acutely, over the past two weeks, she developed epigastric pain which had been constant and progressive and unrelieved by Vicodin as an outpatient.  She has not had any vomiting until the day of admission.  No documented fever or chills.  No diarrhea, melena, or hematochezia.  Her husband reported that she had been eating very little, though with no significant change in her weight. She was seen by Dr. Olevia Perches in the office about a week prior to this admission, treated with bowel rest and pancreatic enzymes as well as pain medication, and at this time, she presents to the emergency room with progressive pain and is admitted for pain control and further diagnostic evaluation and supportive management.  LABORATORY STUDIES:  Admission April 28, WBC 12.5, hemoglobin 13.9, hematocrit 41.6, MCV 98, platelets 278.  Follow-up on April 29, WBC 11.1,  hemoglobin 11.8, hematocrit 35.4.  On admission, sodium 123, potassium 3.2, chloride 89, glucose 154, BUN less than 5, creatinine 0.7, albumin 3.4.  Serial electrolyte panels were ordered on May 2, sodium 122, potassium 4.4, chloride 89, BUN less than 5, creatinine 0.4.  Follow-up on May 3, sodium 127, on May 4, sodium 134, potassium 3.7, albumin 2.6.  Liver function studies were normal.  On admission lipase was 768, amylase 206.  Follow-up on April 30, showed amylase 91, lipase 206 and on May 2, amylase 66, lipase 107.  Hemoglobin A1C was 6.  Lipid profile on May 3 showed a cholesterol of 117, triglycerides 54, HDL 43, LDL 63.  TSH 8.8.  Free T4 0.74.  Ferritin 118.  Cortisol level on May 3, a.m. was 25.8, prealbumin 10.6.  Urinalysis on admission showed large amounts of hemoglobin, 30+ protein, leukocyte esterase positive small, and RBCs too numerous to count.  Patient with menses at the time.  X-RAY STUDIES:  CT scan of the abdomen and pelvis on April 29, showed changes of pancreatitis with probable tiny distal  common bile duct stone.  No pseudocyst or abscess.  There was a probable mucus-filled bronchi in the left base posterior medially.  Recommended follow-up CT scan.  MRCP on May 1, showed extensive pancreatic and peripancreatic edema consistent with acute pancreatitis.  There was a small amount of ascites and perirenal fluid bilaterally.  No pseudocyst or abscess identified.  There was a dilated pancreatic duct with dilated branches.  There was a transition in the pancreatic head suggesting the possibility of a stricture in the pancreatic head.  Common bile duct and common hepatic duct did not appear to be dilated, and no stone was identified.  Chest x-ray on May 3 showed mild interstitial prominence.  HOSPITAL COURSE:  The patient was admitted to the service of Dr. Delfin Edis, initially kept n.p.o., placed on IV fluids at 150 cc an hour, given Demerol and Phenergan for control of pain and nausea.  The patient had a very difficult time with pain control and was switched to a morphine PCA on the morning of April 29.  She was also given Ativan on a p.r.n. basis.  She underwent CT scan of the abdomen and pelvis with findings as outlined above. Despite the morphine PCA, she continued to appear quite uncomfortable with a very flat affect.  By May 1, she was feeling somewhat better.  She was allowed sips of clear liquids.  She did have persistent hyponatremia which did not initially correct despite normal saline.  We did schedule her for MRCP for further evaluation of the common bile duct and pancreatic duct with findings as outlined above.  We did ask Dr. Jenean Lindau to see her for endocrinology due to the persistent hyponatremia.  He further evaluated this without any significant findings.  She did have a slightly low free T4 and slightly high TSH, felt to be consistent with mild hypothyroidism or resolving ______ thyroid and advised repeating in two weeks and to treat if these abnormalities  were  persisting.  The sodium gradually did correct with continued replacement.  Dr. Patsey Berthold also saw her for upper respiratory symptoms with wheezing and rhonchi and an abnormal CT scan as outlined above.  She felt that her symptoms were consistent with atelectasis and bronchospasm, adjusted her medications with nebulizers, etc., accordingly.  Gradually, she improved.  By May 3, she was more alert and communicative but complaining of increased pain with attempts to eat and also nausea.  The decision at that time was made to back off on her diet, place a PICC line, and start her on TNA.  She also required a Duragesic patch to help with her pain control, and we gradually weaned her from the PCA.  Interestingly, she refused the PICC line, by May 4 was feeling better with pain under better control, and her diet was left at clear liquids.  By May 6, the patient was adamant about discharge to home, stated that she was hungry and that her abdominal pain was back to baseline, and that she did not feel she needed to be in the hospital any longer.  She was placed on a low fat diet, and discharge  plans were made.  FOLLOW-UP:  She was to follow with Dr. Delfin Edis in the office in 2-3 weeks, to call for any problems in the interim.  DIET:  She was to maintain a low fat diet, avoid all alcohol.  ACTIVITY:  As tolerated.  DISCHARGE MEDICATIONS:  Same as prior to admission. 1. Singulair 10 p.o. q.d. 2. Advair inhaler b.i.d. 3. Atrovent 2 puffs q.d. 4. Albuterol 2 puffs q.d. p.r.n. 5. Celexa 40 mg b.i.d. 6. Xanax 0.5 mg t.i.d. p.r.n. 7. Prometrium 100 mg 2 caps p.o. q.d. 8. Hydrocodone p.r.n. for pain.  CONDITION ON DISCHARGE:  Stable. DD:  02/17/01 TD:  02/19/01 Job: [id number]

## 2011-02-19 NOTE — Discharge Summary (Signed)
Phyllis [name], Phyllis [name]                         ACCOUNT NO.:  [id number]   MEDICAL RECORD NO.:  [id number]                   PATIENT TYPE:  INP   LOCATION:  3086                                 FACILITY:  [facility]   PHYSICIAN:  Norberto Sorenson T. Fuller Plan, M.D. [facility]          DATE OF BIRTH:  06/13/1956   DATE OF ADMISSION:  05/04/2003  DATE OF DISCHARGE:  05/08/2003                                 DISCHARGE SUMMARY   ADMISSION DIAGNOSES:  1. Chronic calcific pancreatitis with exacerbation of pain.  2. Narcotic dependence.  3. History of pancreatic duct stricture.  4. Positive hepatitis C antibody with previous very low viral load and     remote liver biopsy.  5. Chronic tobacco use.  6. Asthma.  7. Anxiety.  8. Status post remote spine surgery.  9. Status post hemorrhoidectomy and bilateral tubal ligation.  10.      Status post cholecystectomy in 1994.   DISCHARGE DIAGNOSES:  1. Chronic calcific pancreatitis with exacerbation of chronic pain and     progressive changes of pancreatic ductal dilation on MRCP with evidence     of side branch dilation as well with focal transition in the pancreatic     head, cannot rule out intraductal stones.  2. Chronic abdominal pain with narcotic dependence secondary to above.  3. History of hepatitis C, not treated to date.  4. Mildly elevated liver function tests.   CONSULTATIONS:  None.   PROCEDURES:  1. MRI of the abdomen.  2. MRCP.   BRIEF HISTORY:  Phyllis [name] is a 55 year old African-American female known to  Dr. Olevia Perches with a history of chronic calcific pancreatitis with recurrent  episodes of abdominal pain.  She was last admitted in 2002.  Had ERCP with  Dr. Olevia Perches in 1999, which did show a stricture in the main pancreatic duct  at the junction of the body and the head of the pancreas.  There was no  significant proximal common bile duct dilation.  She is status post  cholecystectomy, does have positive hepatitis C antibody, but previously had  negligible viral load and has not been treated.  She does have a remote  history of ETOH abuse and more remotely IV drug use, but this has been  inactive for many years.  She did develop post-ERCP pancreatitis in 1999.  She is now being followed by the [facility] Clinic at [facility] for chronic  abdominal pain, had been managed with a Duragesic patch which eventually  became ineffective, and has been changed over to Avinza which is dosed once  daily.  She was started on this per Dr. Theda Sers approximately three months  ago.  At this time, she says over the past two weeks she has had increasing  abdominal pain located primarily in the right upper quadrant radiating into  her back, sometimes up into her right chest.  She has had some nausea  without vomiting, feels that her pain  is worse with solid food.  Her pain  became progressive and she presented to the emergency room for evaluation  and is now admitted per Dr. Olevia Perches for pain management and further  evaluation with possible exacerbation of her chronic pancreatitis.   LABORATORY DATA:  On May 04, 2003, WBC of 6.3, hemoglobin 14, hematocrit of  42, MCV of 93.5, platelets 167.  Electrolytes within normal limits.  BUN 1,  creatinine 0.7, albumin 3.3.  Liver tests on admission:  Total bilirubin of  0.6, alkaline phosphatase 61, ALT 41, AST of 68, amylase of 75, and lipase  of 57.  UA negative.  Followup liver function studies on May 07, 2003,  showed total bilirubin of 1, alkaline phosphatase 59, ALT of 54, AST of 69,  amylase 247, and lipase of 15.  Hepatitis C RNA is positive, this was a  qualitative not quantitative study.   HOSPITAL COURSE:  The patient was admitted to the service of Dr. Delfin Edis.  She was initially placed on bowel rest with sips of clear liquids,  IV fluids.  She was started on IV Dilaudid and given Phenergan as needed for  nausea, covered with IV Protonix, and continued on her home medications  other then the  Avinza.  She was scheduled for MRCP and MRI of the pancreas  for further evaluation.  On admission, her lipase was slightly elevated and  LFTs were minimally elevated as well.  She continued to complain of fairly  significant abdominal pain over the first 48 hours.  This did seem to be  relieved with Dilaudid, but she did not feel that her pain was back to  baseline.  Her appetite was poor, and she really did not want much to eat.  She was continued at bowel rest, had the MRCP on May 06, 2003, which did  show significant progressive of the pancreatic ductal dilation since her  last CT scan done in 2002.  Pancreatic duct now measuring 9 mm.  There is  also a note of side branch dilation.  She had focal changes in the duct with  transition noted in the pancreatic head.  It was felt that she may have  intraductal stones, though this could not definitely be determined.  There  was no evidence of mass or inflammatory changes.  Her liver appeared normal,  and the common bile duct did not show any dilation.  We gradually advanced  her diet, and we had spoken with Dr. Theda Sers at pain management.  She was  converted back to her oral Avinza, and actually by the day of discharge on  May 08, 2003, she was pretty much back to baseline and was comfortable  with her prior dose of Avinza at 60 mg p.o. q.d.  She was to follow up with  the Pain Clinic in one week.   DIET ON DISCHARGE:  Low-fat, full liquids.  The patient was instructed to  advance her diet to low fat over the next few days.   FOLLOWUP:  She was to follow up with Dr. Olevia Perches on June 11, 2003, at 2  p.m.  Call for any problems in the interim.   DISCHARGE MEDICATIONS:  1. Avinza 60 mg p.o. daily.  2. Zyprexa 7.5 mg daily.  3. Prevacid 30 mg daily.  4. Advair as previous.  5. Albuterol nebulizer p.r.n.  6. Xanax 0.25 mg t.i.d. on a p.r.n. basis.  We did discuss referral to [facility] with the patient for possible  EUS  celiac  block, and ERCP for further evaluation of her pancreatic duct  stricture, and possible stone extraction, pancreatic duct sphincterotomy,  etc.  The patient was agreeable with this, and we will arrange this on an  outpatient basis.   CONDITION ON DISCHARGE:  Stable.      Amy Esterwood, P.A.-C. Mellott T. Fuller Plan, M.D. [facility]    AE/MEDQ  D:  05/15/2003  T:  05/16/2003  Job:  [id number]   cc:   Jarvis Morgan, M.D.  [address] [place]  [place] [address]  Fax: [phone]

## 2011-02-19 NOTE — Assessment & Plan Note (Signed)
MEDICAL RECORD NUMBER:  [id number].   [name] returns to the clinic today for followup evaluation. She reports  that she is doing well overall. She is taking her Avinza 60 mg q.12h. for  her abdominal pain. She has been back to see Dr. Olevia Perches and Dr. Jake Michaelis. No  changes have been made. Dr. Olevia Perches is her GI specialist, and Dr. Jake Michaelis is  her primary care physician.   The patient reports that she is working with vocational rehabilitation, and  they may have some type of work they are trying to contact her with. They  apparently left her a message, but she was unable to reach them yesterday.  She continues to do chores around her house as able.   MEDICATIONS:  1.  Avinza 60 mg q.12h. (21 day supply).  2.  Zyprexa 7.5 mg q.h.s.  3.  Prevacid 30 mg q.d.  4.  Celexa 40 mg q.d.  5.  Xanax 0.5 mg 2 tablets q.d. p.r.n.  6.  Albuterol inhaler p.r.n.  7.  Advair Diskus inhaler p.r.n.   PHYSICAL EXAMINATION:  Well appearing thin adult female in no acute  distress. Blood pressure 130/73 with a pulse of 74, respiratory rate 16, and  O2 saturation 98% on room air. She has 5-/5 strength throughout the  bilateral upper and lower extremities. Bulk and tone were normal, and  reflexes were 2+ and symmetrical. She ambulates without any assistive  device.   IMPRESSION:  1.  Chronic abdominal pain, secondary to pancreatitis.  2.  History of hepatitis C.   In the clinic today, we did refill her Avinza 60 mg 1 q.12h. as of December 17, 2004. We will plan on seeing the patient in followup in approximately two to  three months' time with refills prior to that appointment as necessary.      DC/MedQ  D:  12/11/2004 11:20:58  T:  12/11/2004 15:54:23  Job #:  [id number]

## 2011-02-19 NOTE — Op Note (Signed)
[place]  Patient:    Phyllis [name], Phyllis [name]                    MRN: [id number] Proc. Date: 01/18/00 Adm. Date:  [id number] Attending:  Ashok Pall                           Operative Report  PREOPERATIVE DIAGNOSIS:  Degenerative disk disease, L5-S1.  Low back pain, lower extremity pain.  POSTOPERATIVE DIAGNOSIS:  Degenerative disk disease, L5-S1.  Low back pain, lower extremity pain.  OPERATION PERFORMED:  Anterior lumbar interbody fusion with allograft and 14 x 21 mm Ray threaded fusion cages.  SURGEON:  Kyle L. Christella Noa, M.D.  ASSISTANT:  Earleen Newport, M.D.  ANESTHESIA:  General.  INDICATIONS FOR PROCEDURE:  [name] is a patient who underwent diskectomy at L5-S1.  This did not relieve pain as he had in the back and lower extremities.  Significant bony changes and degenerative disk disease at L5-S1.  I therefore recommended she undergo an anterior lumbar fusion.  DESCRIPTION OF PROCEDURE:  The patient had been brought to the operating room, intubated, placed under general anesthesia, prepped and then had her anterior approach done by Dr. Armandina Gemma which is dictated under separate cover. After exposing the L5-S1 disk space.  I placed the spinal needle and took fluoroscopic image.  The fluoroscopic image showed that I was working at L5-S1.  I then used a #15 blade and opened and cut away disk and cement plate material.  Along with Dr. Ellene Route we cleaned out the disk space and scraped off end plate.  After adequately eliminating disk material and reaching the posterior longitudinal ligament, I placed a 14 mm Tang in and then drilled to 21 mm and felt that this was the appropriate length and then subsequently tapped then went to the other side of the double barrel Tang, then drilled and tapped.  I then placed two 14 x 21 mm cages through the Tangs.  I placed allograft along with bone putty, osteoblastic bone putty into the cages.   The cages were capped, irrigated.  I placed more bone and bone putty in between the cages.  I took another fluoroscopic image both anterior and lateral showed the cages to be in good position.  I irrigated the wound.  Then Dr. Armandina Gemma returned and closed the wound.  The patient had a sterile dressing applied and was extubated and moving all extremities postoperatively. DD:  01/18/00 TD:  01/19/00 Job: [id number]

## 2011-02-19 NOTE — Assessment & Plan Note (Signed)
HISTORY OF PRESENT ILLNESS:  Phyllis [name] returns to the clinic today for  follow up evaluation.  She reports that overall she is getting reasonable  relief from her Avinza use 60 mg b.i.d.  She would like to have something to  use on an as needed basis for pain in between.  She is not having recurrent  episodes of pancreatitis.  She understands that fatty foods and stress  usually tend to set her off, and she tries to avoid those as best possible.  She does follow up with Dr. Olevia Perches next week.   MEDICATIONS:  1.  Avinza 60 mg q.12 h.  2.  Zyprexa 7.5 mg q.h.s.  3.  Prevacid 30 mg daily.  4.  Celexa 40 mg daily.  5.  Xanax 0.5 mg two tablet daily p.r.n.  6.  Albuterol inhaler p.r.n.  7.  Advair Discus inhaler p.r.n.   REVIEW OF SYSTEMS:  Positive for weight gain, nausea and vomiting, abdominal  pain and poor appetite along with coughing.   PHYSICAL EXAMINATION:  GENERAL APPEARANCE:  Reasonably well-appearing, thin,  adult female in no acute discomfort.  VITAL SIGNS:  Blood pressure 146/71, pulse 80, respirations 17, O2  saturation 100% on room air.  NEUROLOGICAL:  She has 5-/5 strength throughout the bilateral upper and  lower extremities.  She ambulates without any assisted device.   IMPRESSION:  1.  Chronic abdominal pain secondary to pancreatitis.  2.  History of hepatitis C.   PLAN:  In the office today, we did not need to refill the Avinza medication  as she had sufficient supply at this time. We did give her a new  prescription for Ultram to use 50 mg one tablet p.o. t.i.d. p.r.n. for a  total of 90 with no refills.  We will plan on seeing her in follow up in  approximately 2-3 months time.  We will refill that medication prior to that  appointment if necessary.           ______________________________  Jarvis Morgan, M.D.     DC/MedQ  D:  01/24/2006 10:18:51  T:  01/25/2006 11:41:56  Job #:  [id number]

## 2011-02-19 NOTE — Op Note (Signed)
[name]  Patient:    Phyllis [name], Phyllis [name]                    MRN: [id number] Proc. Date: 01/18/00 Adm. Date:  [id number] Attending:  Ashok Pall CC:         Vickki Muff. Christella Noa, M.D.                           Operative Report  PREOPERATIVE DIAGNOSIS:  Degenerative disk disease of lumbosacral spine.  POSTOPERATIVE DIAGNOSIS:  Degenerative disk disease of lumbosacral spine.  PROCEDURE:  Anterior preperitoneal approach to lumbosacral spine.  SURGEON:  Earnstine Regal, M.D.  ASSISTANT:  Coralie Keens, M.D. and Vickki Muff. Christella Noa, M.D.  ANESTHESIA:  General.  ESTIMATED BLOOD LOSS:  Minimal.  PREPARATION:  Betadine.  COMPLICATIONS:  None.  INDICATIONS:  The patient is a 55 year old black female, scheduled by Marylyn Ishihara L. Cabbell, M.D. for fusion of the L5-S1 disk space.  The neurosurgeon desires anterior approach to the lumbosacral spine.  The patient has been seen in my office and advised to the anterior preperitoneal approach, as well as its risks and benefits.  She wishes to proceed.  DESCRIPTION OF PROCEDURE:  The procedure was done in OR #1 at the neurosurgical  operating theater at the [facility]. [facility].  The patient was brought to the operating room and placed in the supine position on the operating room table.  Following the administration of general anesthesia, the patient was positioned, and prepped and draped in the usual strict aseptic fashion.  After ascertaining that an adequate level of anesthesia had been obtained, a left paramedial incision was made from the level of the umbilicus to the pubis. Dissection was carried through the subcutaneous tissues and hemostasis obtained  with the electrocautery.  Anterior rectus sheath was incised with the electrocautery.  Rectus muscle was mobilized to the left.  The posterior rectus  sheath was incised laterally.  The preperitoneal plane was entered.  Dissection was carried  laterally down the psoas muscle.  Inferior epigastric vessels were divided between hemostats and ligated with 2-0 silk ties.  The round ligament of the uterus was divided between hemostats and ligated with 2-0 silk ties.  The peritoneal sac was then moved medially.  The Omni-Tract self-retaining retractor system was placed to assist in exposure.  The left ureter was identified and preserved.  Dissection was carried over to the aortic bifurcation in confluence of the vena cava.  The L5-S1 interspace was palpable.  Dissection of this area as carried out using the harmonic scalpel for transection of tissue in order to prevent collateral damage of tissue from the electrocautery.  The L5-S1 vertebral interspace was exposed widely.  Again, the Omni-Tract retractor system was placed to maintain exposure.  At this point, Kyle L. Cabbell, M.D. assumed control of the procedure.  The interspace was marked with a needle, and AP and lateral views confirmed that this indeed was the L5-S1 interspace.  Instrumentation of the spine will be dictated  under a separate operative report.  Following completion of the lumbar fusion, the retroperitoneum was inspected for hemostasis.  It was irrigated copiously with warm saline which was evacuated. Hemostasis was obtained with the electrocautery.  The ureter was returned to its normal position and appeared to be intact without injury and actively ________ o stimulation.  The peritoneal sac was allowed to return to its normal anatomic position.  The posterior rectus sheath was reapproximated with a running 0 Vicryl suture.  The rectus muscle was irrigated with warm saline which was evacuated. The anterior rectus sheath was closed with a running 0 Vicryl suture.  The subcutaneous tissue was irrigated and hemostasis obtained with the electrocautery.  The skin was closed with stainless steel staples.  A sterile occlusive dressing was applied.  The  patient was awakened from anesthesia and brought to the recovery room in stable condition.  The patient tolerated the procedure well. DD:  01/18/00 TD:  01/19/00 Job: [id number]

## 2011-02-19 NOTE — Assessment & Plan Note (Signed)
Phyllis [name] returns to the clinic today for followup evaluation.  She reports  that she is getting good relief from her Avinza use, 60 mg twice a day.  We  did give her a new prescription for Ultram, use 50 mg 3 times per day as  needed during the last clinic visit and she reports that she got good  relief.  She is out of the Ultram but would like a refill on that.  She  seems to use that through the day as needed and used the Avinza usually at  night and in the morning.   The patient reports that Dr. Olevia Perches, her GI specialist, has called in a  specialist by the name of Dr. Ardis Hughs.  They have done an endoscopy and she  is due to follow up with them at the end of June to see what the results  are.   MEDICATIONS:  1.  Avinza 60 mg q.12 h.  2.  Zyprexa 7.5 mg nightly.  3.  Prevacid 30 mg every day.  4.  Celexa 40 mg every day.  5.  Xanax 0.5 mg, 2 tablets every day p.r.n.  6.  Albuterol inhaler p.r.n.  7.  Advair Diskus inhaler p.r.n.   REVIEW OF SYSTEMS:  Noncontributory.   PHYSICAL EXAMINATION:  GENERAL:  A reasonably well-appearing, thin, adult  female in mild to no acute discomfort.  VITAL SIGNS:  Blood pressure 156/88 with a pulse of 87, respiratory rate 16,  and O2 saturation 100% on room air.  MUSCULOSKELETAL:  She has 5-/5 strength throughout the bilateral upper and  lower extremities.  She ambulates without any assistive device.   IMPRESSION:  1.  Chronic abdominal pain secondary to pancreatitis.  2.  History of hepatitis C.   In the office today the patient has a sufficient supply of Avinza as that  was just recently filled, March 14, 2006.  We did give her a refill  prescription on the Ultram at 50 mg, one tablet p.o. t.i.d. a total of #90,  with 2 refills.   We will plan on seeing the patient in followup in approximately two months'  time with refills prior to that appointment as necessary.           ______________________________  Jarvis Morgan, M.D.     DC/MedQ  D:  03/18/2006 12:51:27  T:  03/18/2006 14:32:33  Job #:  [id number]

## 2011-02-19 NOTE — Consult Note (Signed)
NAME:  Phyllis, [name] NO.:  [id number]   MEDICAL RECORD NO.:  [id number]                   PATIENT TYPE:  REC   LOCATION:  [facility]                                  FACILITY:  [facility]   PHYSICIAN:  Rosann Auerbach, MD                     DATE OF BIRTH:  02-23-1956   DATE OF CONSULTATION:  DATE OF DISCHARGE:                                   CONSULTATION   [name].  I  evaluated her health and history form and 14 point review of systems.   In the interim, I have discussed her with her primary care physician,  reviewed medical necessity for narcotic based pain medication, discussed  with her primary care, and contemplated course of care.   1. Phyllis [name] is complaining of persistent abdominal pain, but finding some     relief cycling with hydrocodone being added as breakthrough to the     Duragesic.  The Duragesic is met with less satisfaction, she seems to     have occasional rashes around the patch, and I really want to avoid the     acetaminophen and the hydrocodone secondary to hepatitis C and her     abdominal pain with pancreatitis.  To this end, I will go ahead and     switch her to Avinza.  I had an extensive discussion as to the potential     risks, complications, habituating nature, risks to drive, and issues     surrounding narcotics for chronic nonmalignant pain.  2. As I examine her, I really do not find anything new that I would consider     either addressing with an interventional procedure, or moving forward in     a diagnostic scenario.  I will give her a month's worth of Avinza, see     how she does and follow up with her, I instructed her not to cut or     crush.  3. Lifestyle enhancement is discussed, again cigarette cessation is best     outcome, discussed home based therapy.   Objectively, abdominal pain is noted, seems to be fairly diffuse, with no  change in hepatosplenomegaly.  She  has modest suprapubic discomfort but no  evidence of CVA tenderness, afebrile, no neurological complaints, motor,  sensory, reflexes.   IMPRESSION:  Chronic abdominal pain.   PLAN:  Introduction of Avinza.  Full disclosure.  Followup in one month.  Lifestyle enhancements discussed.                                               Rosann Auerbach, MD    HH/MEDQ  D:  11/20/2002  T:  11/20/2002  Job:  [id number]   cc:  Harden Mo, M.D.  [address]  [place]  [place] [address]  Fax: [phone]

## 2011-02-19 NOTE — Assessment & Plan Note (Signed)
[name] returns to clinic today for follow-up evaluation.  She reports  that overall she has been doing well.  She has not seen her GI specialist,  Dr. Delfin Edis, recently.  She has had no hospitalizations recently for her  pancreatitis.   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  She has not obtained any part-time work at the present time but  is still looking.   MEDICATIONS:  1. Avinza 60 mg daily.  2. Celexa 25 mg daily.  3. Prevacid 30 mg daily.  4. Alprazolam 0.5 mg p.r.n.  5. Zyprexa 7.5 mg q.h.s. per Dr. Ebony Hail.   PHYSICAL EXAMINATION:  GENERAL APPEARANCE:  A well-appearing adult female.  VITAL SIGNS: Blood pressure 142/67 with a pulse of 93 and O2 saturation 97%  on room air.  NEUROLOGIC:  She has strength of 5/5 throughout the bilateral upper and  lower extremities. Bulk and tone were normal and reflexes 2+ and  symmetrical.  There was mild tenderness to palpation in the mid upper  abdominal region.   IMPRESSION:  1. Chronic abdominal pain secondary to pancreatitis.  2. History of hepatitis C.   In the clinic today, I did refill her Avinza 60 mg daily as of September 19, 2003, total of 21 tablets.  She understands that she will need to guard this  prescription and fill it at the appropriate time.  I will plan on seeing her  in follow-up in approximately two months' time.      Jarvis Morgan, M.D.   DC/MedQ  D:  09/05/2003 12:03:19  T:  09/05/2003 12:43:12  Job #:  [id number]

## 2011-02-19 NOTE — Assessment & Plan Note (Signed)
REASON FOR VISIT:  Ms. Aponte returns to clinic today for followup  evaluation.   The patient reports that she has followed up with Dr. Olevia Perches.  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  The patient was  also told that her hepatitis C was becoming more active.  The patient is  being referred to a hepatologist across from [facility] and is due  to see them over the next several weeks.   The patient has had a change in her insurance also.  Her husband is retired  now, and her insurance has changed such that her copay has increased.  She  also requests that I write a slip to try to avoid jury duty.  She is worried  that she would not be able to stay awake and alert during the trial and  therefore requests that she be excused from jury duty.   The patient is out of work now per Dr. Olevia Perches.  She reports that she is  applying for disability.   The patient does have a sufficient supply of Avinza 6 mg q.12h.  She does  ask for a refill on the Tramadol which she reports is helping her.   MEDICATIONS:  1. Avinza 60 mg q.12h.  2. Zyprexa 7.5 mg nightly.  3. Prevacid 30 mg daily.  4. Celexa 40 mg daily.  5. Xanax 0.5 mg two tablets daily p.r.n.  6. Albuterol inhaler p.r.n.  7. Advair Diskus inhaler p.r.n.  8. Tramadol 50 mg t.i.d. p.r.n.   REVIEW OF SYSTEMS:  Positive for weight loss, abdominal pain, poor appetite,  nausea, limb swelling, and coughing.   PHYSICAL EXAMINATION:  GENERAL:  Reasonably well-appearing, thin adult  female in mild acute discomfort.  VITAL SIGNS:  Blood pressure 140/80, pulse 94, respiratory rate 16, and O2  saturation 94% on room air.  EXTREMITIES:  She has 5-/5 strength throughout the bilateral upper and lower  extremities.  She ambulates without any assistive device.   IMPRESSION:  1. Chronic abdominal pain secondary to chronic pancreatitis.  2. History of hepatitis C, active.   TREATMENT:  In the office today, we did give the  patient a refill on her  Ultram medication with three refills.  We also wrote her out a slip for an  excuse from jury duty.  She has a sufficient supply of Avinza at this time.   FOLLOW UP:  We will plan on seeing the patient in followup in this office in  approximately three months' time, with refills prior to that appointment as  necessary.          ______________________________  Jarvis Morgan, M.D.    DC/MedQ  D:  05/19/2006 11:37:46  T:  05/19/2006 11:59:09  Job #:  [id number]

## 2011-03-16 ENCOUNTER — Ambulatory Visit (INDEPENDENT_AMBULATORY_CARE_PROVIDER_SITE_OTHER): Payer: Medicare Other | Admitting: Internal Medicine

## 2011-03-16 ENCOUNTER — Encounter: Payer: Self-pay | Admitting: Internal Medicine

## 2011-03-16 DIAGNOSIS — J45909 Unspecified asthma, uncomplicated: Secondary | ICD-10-CM

## 2011-03-16 DIAGNOSIS — B370 Candidal stomatitis: Secondary | ICD-10-CM | POA: Insufficient documentation

## 2011-03-16 MED ORDER — CLOTRIMAZOLE 10 MG MT TROC: 10.0000 mg | Freq: Three times a day (TID) | OROMUCOSAL | Status: AC

## 2011-03-16 MED ORDER — MOMETASONE FURO-FORMOTEROL FUM 100-5 MCG/ACT IN AERO: 2.0000 | INHALATION_SPRAY | Freq: Two times a day (BID) | RESPIRATORY_TRACT | Status: DC

## 2011-03-16 NOTE — Progress Notes (Signed)
Subjective:    Patient ID: Phyllis [name], female    DOB: 08/21/1956, 55 y.o.   MRN: [id number]  Asthma She complains of shortness of breath and wheezing. There is no chest tightness, cough, difficulty breathing, frequent throat clearing, hemoptysis, hoarse voice or sputum production. This is a chronic problem. The current episode started more than 1 year ago. The problem occurs intermittently. The problem has been unchanged. Pertinent negatives include no appetite change, chest pain, dyspnea on exertion, ear congestion, ear pain, fever, headaches, heartburn, malaise/fatigue, myalgias, nasal congestion, orthopnea, PND, postnasal drip, rhinorrhea, sneezing, sore throat, sweats, trouble swallowing or weight loss. Her symptoms are aggravated by exposure to smoke. Her symptoms are alleviated by steroid inhaler and beta-agonist. She reports moderate improvement on treatment. Her past medical history is significant for asthma and COPD. There is no history of bronchiectasis, bronchitis, emphysema or pneumonia.      Review of Systems  Constitutional: Negative for fever, chills, weight loss, malaise/fatigue, diaphoresis, activity change, appetite change, fatigue and unexpected weight change.  HENT: Positive for mouth sores. Negative for hearing loss, ear pain, nosebleeds, congestion, sore throat, hoarse voice, facial swelling, rhinorrhea, sneezing, drooling, trouble swallowing, neck pain, neck stiffness, dental problem, postnasal drip, sinus pressure, tinnitus and ear discharge.   Eyes: Negative.   Respiratory: Positive for shortness of breath and wheezing. Negative for apnea, cough, hemoptysis, sputum production, choking, chest tightness and stridor.   Cardiovascular: Negative for chest pain, dyspnea on exertion and PND.  Gastrointestinal: Negative for heartburn, nausea, vomiting, abdominal pain, diarrhea, constipation and blood in stool.  Genitourinary: Negative.   Musculoskeletal: Negative for  myalgias, back pain, joint swelling, arthralgias and gait problem.  Skin: Negative for color change, pallor and rash.  Neurological: Negative.  Negative for headaches.  Hematological: Negative for adenopathy. Does not bruise/bleed easily.  Psychiatric/Behavioral: Negative.        Objective:   Physical Exam  Vitals reviewed. Constitutional: She is oriented to person, place, and time. She appears well-developed and well-nourished. No distress.  HENT:  Head: No trismus in the jaw.  Mouth/Throat: Uvula is midline. Mucous membranes are not pale, not dry and not cyanotic. She does not have dentures. No oral lesions. Normal dentition. No dental abscesses, uvula swelling, lacerations or dental caries. Oropharyngeal exudate (she has scant scattered white exudate on the top of her tongue and on the soft palate and there is diffuse erythema thre as well, there are no  plaques on the mucosa) present. No posterior oropharyngeal edema, posterior oropharyngeal erythema or tonsillar abscesses.  Eyes: Conjunctivae and EOM are normal. Pupils are equal, round, and reactive to light. Right eye exhibits no discharge. Left eye exhibits no discharge. No scleral icterus.  Neck: Normal range of motion. Neck supple. No JVD present. No tracheal deviation present. No thyromegaly present.  Cardiovascular: Normal rate, regular rhythm, normal heart sounds and intact distal pulses.  Exam reveals no gallop and no friction rub.   No murmur heard. Pulmonary/Chest: Effort normal and breath sounds normal. No stridor. No respiratory distress. She has no wheezes. She has no rales. She exhibits no tenderness.  Abdominal: Soft. Bowel sounds are normal. She exhibits no distension and no mass. There is no tenderness. There is no rebound and no guarding.  Musculoskeletal: Normal range of motion. She exhibits no edema and no tenderness.  Lymphadenopathy:    She has no cervical adenopathy.  Neurological: She is alert and oriented to  person, place, and time. She has normal reflexes. She displays normal  reflexes. No cranial nerve deficit. She exhibits normal muscle tone. Coordination normal.  Skin: Skin is warm and dry. No rash noted. She is not diaphoretic. No erythema. No pallor.  Psychiatric: She has a normal mood and affect. Her behavior is normal. Judgment and thought content normal.          Assessment & Plan:

## 2011-03-16 NOTE — Assessment & Plan Note (Signed)
She tells me that she has been using a friend's Qvar for asthma and that has caused the thrush, I have sent an Rx for mycelex to her pharmacy

## 2011-03-16 NOTE — Assessment & Plan Note (Signed)
Start [employer]

## 2011-03-16 NOTE — Patient Instructions (Signed)
Asthma, Adult Asthma is caused by narrowing of the air passages in the lungs. It may be triggered by pollen, dust, animal dander, molds, some foods, respiratory infections, exposure to smoke, exercise, emotional stress or other allergens (things that cause allergic reactions or allergies). Repeat attacks are common. HOME CARE INSTRUCTIONS  Use prescription medications as ordered by your caregiver.   Avoid pollen, dust, animal dander, molds, smoke and other things that cause attacks at home and at work.   You may have fewer attacks if you decrease dust in your home. Electrostatic air cleaners may help.   It may help to replace your pillows or mattress with materials less likely to cause allergies.   Talk to your caregiver about an action plan for managing asthma attacks at home, including, the use of a peak flow meter which measures the severity of your asthma attack. An action plan can help minimize or stop the attack without having to seek medical care.   If you are not on a fluid restriction, drink 8 to 10 glasses of water each day.   Always have a plan prepared for seeking medical attention, including, calling your physician, accessing local emergency care, and calling 911 (in the [place] for a severe attack.   Discuss possible exercise routines with your caregiver.   If animal dander is the cause of asthma, you may need to get rid of pets.  SEEK MEDICAL CARE IF:  You have wheezing and shortness of breath even if taking medicine to prevent attacks.   An oral temperature above 100.5 develops.   You have muscle aches, chest pain or thickening of sputum.   Your sputum changes from clear or white to yellow, green, gray or bloody.   You have any problems that may be related to the medicine you are taking (such as a rash, itching, swelling or trouble breathing).  SEEK IMMEDIATE MEDICAL CARE IF:  Your usual medicines do not stop your wheezing or there is increased coughing and/or  shortness of breath.   You have increased difficulty breathing.   You have an oral temperature above 100.5, not controlled by medicine.  MAKE SURE YOU:  Understand these instructions.   Will watch your condition.   Will get help right away if you are not doing well or get worse.  Document Released: 09/20/2005 Document Re-Released: 10/12/2009 [facility] Patient Information 2011 [place].

## 2011-04-01 ENCOUNTER — Other Ambulatory Visit: Payer: Self-pay | Admitting: Gastroenterology

## 2011-04-01 ENCOUNTER — Ambulatory Visit (INDEPENDENT_AMBULATORY_CARE_PROVIDER_SITE_OTHER): Payer: Medicare Other | Admitting: Gastroenterology

## 2011-04-01 VITALS — BP 143/86 | HR 76 | Temp 97.5°F | Ht 65.0 in | Wt 172.0 lb

## 2011-04-01 DIAGNOSIS — B182 Chronic viral hepatitis C: Secondary | ICD-10-CM

## 2011-04-01 LAB — HEPATIC FUNCTION PANEL
ALT: 66 U/L — ABNORMAL HIGH (ref 0–35)
AST: 86 U/L — ABNORMAL HIGH (ref 0–37)
Alkaline Phosphatase: 90 U/L (ref 39–117)
Indirect Bilirubin: 0.4 mg/dL (ref 0.0–0.9)
Total Protein: 7.3 g/dL (ref 6.0–8.3)

## 2011-04-01 LAB — PROTIME-INR
INR: 1.06 (ref <1.50)
Prothrombin Time: 14.2 seconds (ref 11.6–15.2)

## 2011-04-08 NOTE — Progress Notes (Signed)
Phyllis [name], Phyllis [name]    MR#:  [id number]      DATE:  04/01/2011  DOB:  01/19/1956    cc: Neal Dy, MD    REFERRING PHYSICIAN:   [name], MD  [facility] [place], [place]   [place] Fax: [phone].     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   History:  The patient returns today unaccompanied. She has no symptoms referable to her history of hepatitis C. There are no other symptoms to suggest cryoglobulin mediated or decompensated liver disease.  It will be recalled that when I last saw her on 10/01/2010, I had not received any records from the request of records regarding her psychiatric history, despite even faxing a release of information form  to her providers to ascertain the stability of her psychiatric disease before commencing therapy.   Today the patient reports that she has not seen her therapist and that she was assigned to a non-MD therapist, whose name she cannot recall, but she then admitted she has not seen anybody in the last 6 months at  the [facility]. She also describes having significant financial and marital difficulties that have not resolved  since last being seen. She believes this will preclude her from being treated for hepatitis C.   PAST MEDICAL HISTORY:  No other interval change.   CURRENT MEDICATIONS:  1. Diovan 80 mg p.o. daily. 2. Ibuprofen 800 mg p.o. t.i.d. p.r.n. 3. Xanax 0.5 mg p.o. t.i.d. p.r.n. 4. Celexa 40 mg p.o. daily.  ALLERGIES:  No true allergies.    Smoking:  Smoking 2-3 cigarettes per day.    Alcohol:  Denies interval consumption.   REVIEW OF SYSTEMS:  All 10 systems reviewed today with patient and are negative other than that which was mentioned above. She did  not completed CES-D form.   PHYSICAL EXAMINATION:  Constitutional: Well appearing. Vital signs: Height 65 inches, weight 178 pounds, which is up 4 kg from previous.  Blood pressure 143/86, pulse 76, temperature 97.5 degrees Fahrenheit.     ASSESSMENT:  The patient is a 55 year old woman with a history of genotype 1 hepatitis C, IL 28B, CT genotype, with a biopsy on 10/20/2006 showing grade 2 stage 0 disease.  She remains naive to treatment for the  reasons mentioned above.  I think that, in the absence of any reply from her psychiatrist and because of today's statement from the patient, stating that her financial and marital difficulties persist, this is not a good time to  initiate therapy for hepatitis C.  On discussion today with the patient, we discussed these issues as mentioned above. She was very much in agreement that she did not think it is the time to put her on therapy for hepatitis C. We discussed the  need for followup. I have explained that she should return in a year's time to make sure she does not get lost to follow up.     plan:  1. She received her final hepatitis B vaccine today. 2. She previously received Twinrix in 2008, so I consider her hepatitis A vaccination series complete. 3. I have checked her laboratories today including liver enzymes and a PTT. 4. She will return in a year's time in followup but knows  to contact us earlier should she change her mind about commencing on therapy and has resumed counseling. 5. By way of this note, today I will request, as I have done in the past, records from Dr. Georgiann Cocker and [facility] regarding the patient's compliance with psychiatric followup and stability of her psychiatric condition.             Marty Heck, MD   ADDENDUM:  Synthetic function preserved  [phone]  D:  Thu Jun 28 19:32:32 2012 ; T:  Sat Jun 30 12:35:28 2012  Job #:  [id number]

## 2011-04-13 ENCOUNTER — Encounter: Payer: Self-pay | Admitting: Internal Medicine

## 2011-04-14 ENCOUNTER — Ambulatory Visit: Payer: Medicare Other | Admitting: Internal Medicine

## 2011-04-15 ENCOUNTER — Other Ambulatory Visit (HOSPITAL_COMMUNITY)
Admission: RE | Admit: 2011-04-15 | Discharge: 2011-04-15 | Disposition: A | Discharge status: Home / Self Care | Payer: Medicare Other | Source: Ambulatory Visit | Attending: Internal Medicine | Admitting: Internal Medicine

## 2011-04-15 ENCOUNTER — Encounter: Payer: Self-pay | Admitting: Internal Medicine

## 2011-04-15 ENCOUNTER — Other Ambulatory Visit (INDEPENDENT_AMBULATORY_CARE_PROVIDER_SITE_OTHER): Payer: Medicare Other

## 2011-04-15 ENCOUNTER — Ambulatory Visit (INDEPENDENT_AMBULATORY_CARE_PROVIDER_SITE_OTHER): Payer: Medicare Other | Admitting: Internal Medicine

## 2011-04-15 DIAGNOSIS — E039 Hypothyroidism, unspecified: Secondary | ICD-10-CM

## 2011-04-15 DIAGNOSIS — K219 Gastro-esophageal reflux disease without esophagitis: Secondary | ICD-10-CM

## 2011-04-15 DIAGNOSIS — B1710 Acute hepatitis C without hepatic coma: Secondary | ICD-10-CM

## 2011-04-15 DIAGNOSIS — B370 Candidal stomatitis: Secondary | ICD-10-CM

## 2011-04-15 DIAGNOSIS — E559 Vitamin D deficiency, unspecified: Secondary | ICD-10-CM

## 2011-04-15 DIAGNOSIS — F329 Major depressive disorder, single episode, unspecified: Secondary | ICD-10-CM

## 2011-04-15 DIAGNOSIS — Z0000 Encounter for general adult medical examination without abnormal findings: Secondary | ICD-10-CM

## 2011-04-15 DIAGNOSIS — F3289 Other specified depressive episodes: Secondary | ICD-10-CM

## 2011-04-15 DIAGNOSIS — I10 Essential (primary) hypertension: Secondary | ICD-10-CM

## 2011-04-15 DIAGNOSIS — E876 Hypokalemia: Secondary | ICD-10-CM

## 2011-04-15 DIAGNOSIS — Z124 Encounter for screening for malignant neoplasm of cervix: Secondary | ICD-10-CM

## 2011-04-15 DIAGNOSIS — Z01419 Encounter for gynecological examination (general) (routine) without abnormal findings: Secondary | ICD-10-CM | POA: Insufficient documentation

## 2011-04-15 LAB — CBC WITH DIFFERENTIAL/PLATELET
Basophils Absolute: 0 10*3/uL (ref 0.0–0.1)
Basophils Relative: 0.4 % (ref 0.0–3.0)
Eosinophils Relative: 1.1 % (ref 0.0–5.0)
Hemoglobin: 13.8 g/dL (ref 12.0–15.0)
Lymphocytes Relative: 39.4 % (ref 12.0–46.0)
Monocytes Relative: 9.2 % (ref 3.0–12.0)
Neutro Abs: 2.8 10*3/uL (ref 1.4–7.7)
RBC: 4.26 Mil/uL (ref 3.87–5.11)
RDW: 12.7 % (ref 11.5–14.6)
WBC: 5.7 10*3/uL (ref 4.5–10.5)

## 2011-04-15 LAB — BASIC METABOLIC PANEL
BUN: 8 mg/dL (ref 6–23)
Chloride: 92 mEq/L — ABNORMAL LOW (ref 96–112)
Potassium: 3.4 mEq/L — ABNORMAL LOW (ref 3.5–5.1)

## 2011-04-15 LAB — LIPID PANEL
LDL Cholesterol: 61 mg/dL (ref 0–99)
Total CHOL/HDL Ratio: 2
Triglycerides: 55 mg/dL (ref 0.0–149.0)

## 2011-04-15 LAB — HM PAP SMEAR: HM Pap smear: NORMAL

## 2011-04-15 MED ORDER — POTASSIUM CHLORIDE CRYS ER 15 MEQ PO TBCR: 15.0000 meq | EXTENDED_RELEASE_TABLET | Freq: Two times a day (BID) | ORAL | Status: DC

## 2011-04-15 MED ORDER — ALPRAZOLAM 0.5 MG PO TABS: 0.5000 mg | ORAL_TABLET | Freq: Three times a day (TID) | ORAL | Status: DC

## 2011-04-15 NOTE — Progress Notes (Signed)
Subjective:    Patient ID: Phyllis [name], female    DOB: Feb 14, 1956, 55 y.o.   MRN: [id number]  HPI She comes in for a complete physical and offers no complaints but tells me that she needs a xanax refill, she saw her liver doctor recently and no treatment for the Hep C is planned at this time.   Review of Systems  Constitutional: Negative.  Negative for fever, fatigue and unexpected weight change.  HENT: Negative.   Eyes: Negative.   Respiratory: Negative for apnea, cough, choking, chest tightness, shortness of breath, wheezing and stridor.   Cardiovascular: Negative for chest pain, palpitations and leg swelling.  Gastrointestinal: Negative for nausea, vomiting, abdominal pain, diarrhea, constipation, blood in stool, abdominal distention, anal bleeding and rectal pain.  Genitourinary: Negative for dysuria, urgency, frequency, hematuria, flank pain, decreased urine volume, vaginal bleeding, vaginal discharge, enuresis, difficulty urinating, genital sores, vaginal pain, menstrual problem, pelvic pain and dyspareunia.  Musculoskeletal: Negative.   Skin: Negative for color change, pallor, rash and wound.  Neurological: Negative.   Hematological: Negative.   Psychiatric/Behavioral: Negative for suicidal ideas, hallucinations, behavioral problems, confusion, sleep disturbance, self-injury, dysphoric mood, decreased concentration and agitation. The patient is nervous/anxious. The patient is not hyperactive.        Objective:   Physical Exam  Vitals reviewed. Constitutional: She is oriented to person, place, and time. She appears well-developed and well-nourished. No distress.  HENT:  Head: Normocephalic and atraumatic.  Right Ear: External ear normal.  Left Ear: External ear normal.  Nose: Nose normal.  Mouth/Throat: Oropharynx is clear and moist. No oropharyngeal exudate.  Eyes: Conjunctivae and EOM are normal. Pupils are equal, round, and reactive to light. Right eye exhibits no  discharge. Left eye exhibits no discharge. No scleral icterus.  Neck: Normal range of motion. Neck supple. No JVD present. No tracheal deviation present. No thyromegaly present.  Cardiovascular: Normal rate, regular rhythm and normal heart sounds.  Exam reveals no gallop and no friction rub.   No murmur heard. Pulmonary/Chest: Effort normal and breath sounds normal. No stridor. No respiratory distress. She has no wheezes. She has no rales. She exhibits no tenderness.  Abdominal: Soft. Bowel sounds are normal. She exhibits no distension and no mass. There is no tenderness. There is no rebound and no guarding. Hernia confirmed negative in the right inguinal area and confirmed negative in the left inguinal area.  Genitourinary: Rectum normal, vagina normal and uterus normal. Rectal exam shows no external hemorrhoid, no internal hemorrhoid, no fissure, no mass, no tenderness and anal tone normal. Guaiac negative stool. No breast swelling, tenderness, discharge or bleeding. Pelvic exam was performed with patient supine. No labial fusion. There is no rash, tenderness, lesion or injury on the right labia. There is no rash, tenderness, lesion or injury on the left labia. Uterus is not deviated, not enlarged and not tender. Cervix exhibits no motion tenderness, no discharge and no friability. Right adnexum displays no mass, no tenderness and no fullness. Left adnexum displays no mass, no tenderness and no fullness. No erythema, tenderness or bleeding around the vagina. No foreign body around the vagina. No signs of injury around the vagina. No vaginal discharge found.  Musculoskeletal: Normal range of motion. She exhibits no edema and no tenderness.  Lymphadenopathy:    She has no cervical adenopathy.       Right: No inguinal adenopathy present.       Left: No inguinal adenopathy present.  Neurological: She is alert and oriented  to person, place, and time. She has normal reflexes. She displays normal reflexes. No  cranial nerve deficit. She exhibits normal muscle tone. Coordination normal.  Skin: Skin is warm and dry. No rash noted. She is not diaphoretic. No erythema. No pallor.  Psychiatric: She has a normal mood and affect. Her behavior is normal. Judgment and thought content normal.        Lab Results  Component Value Date   WBC 5.4 12/07/2010   HGB 13.9 12/07/2010   HCT 40.1 12/07/2010   PLT 180.0 12/07/2010   ALT 66* 04/01/2011   AST 86* 04/01/2011   NA 132* 12/07/2010   K 3.9 12/07/2010   CL 97 12/07/2010   CREATININE 0.6 12/07/2010   BUN 4* 12/07/2010   CO2 30 12/07/2010   TSH 0.86 12/07/2010   INR 1.06 04/01/2011   HGBA1C 6.4 06/06/2009    Assessment & Plan:

## 2011-04-15 NOTE — Assessment & Plan Note (Signed)
This has resolved, I will order labs to look for other causes

## 2011-04-15 NOTE — Assessment & Plan Note (Signed)
No changes at this time.

## 2011-04-15 NOTE — Assessment & Plan Note (Signed)
Start K+ replacement, recheck in one month

## 2011-04-15 NOTE — Patient Instructions (Signed)

## 2011-04-15 NOTE — Assessment & Plan Note (Signed)
No changes, continue xanax and citalopram

## 2011-04-15 NOTE — Assessment & Plan Note (Signed)
BP is well controlled

## 2011-04-15 NOTE — Assessment & Plan Note (Signed)
Will check her vit d level

## 2011-04-15 NOTE — Assessment & Plan Note (Signed)
PAP smear and exam done, routine labs ordered, I asked her to quit smoking

## 2011-04-15 NOTE — Assessment & Plan Note (Signed)
TSH ordered

## 2011-04-16 LAB — HIV ANTIBODY (ROUTINE TESTING W REFLEX): HIV: NONREACTIVE

## 2011-04-18 LAB — VITAMIN D 1,25 DIHYDROXY: Vitamin D2 1, 25 (OH)2: 9 pg/mL

## 2011-04-22 ENCOUNTER — Encounter: Payer: Self-pay | Admitting: Internal Medicine

## 2011-07-20 LAB — CBC
MCHC: 34.2
Platelets: 184
RBC: 5.25 — ABNORMAL HIGH
RDW: 13.6

## 2011-07-20 LAB — COMPREHENSIVE METABOLIC PANEL
AST: 81 — ABNORMAL HIGH
Albumin: 3.1 — ABNORMAL LOW
BUN: 2 — ABNORMAL LOW
Calcium: 9.1
Chloride: 96
Creatinine, Ser: 0.68
GFR calc Af Amer: >60
Total Bilirubin: 1

## 2011-07-20 LAB — URINALYSIS, ROUTINE W REFLEX MICROSCOPIC
Bilirubin Urine: NEGATIVE
Ketones, ur: NEGATIVE
Nitrite: NEGATIVE
Specific Gravity, Urine: 1.013
Urobilinogen, UA: 1

## 2011-07-20 LAB — DIFFERENTIAL
Basophils Absolute: 0
Eosinophils Relative: 1
Lymphocytes Relative: 31
Lymphs Abs: 2
Monocytes Absolute: 0.4
Neutro Abs: 4

## 2011-07-20 LAB — LIPASE, BLOOD: Lipase: 18

## 2011-10-14 ENCOUNTER — Ambulatory Visit (INDEPENDENT_AMBULATORY_CARE_PROVIDER_SITE_OTHER): Payer: Medicare Other | Admitting: Internal Medicine

## 2011-10-14 ENCOUNTER — Encounter: Payer: Self-pay | Admitting: Internal Medicine

## 2011-10-14 ENCOUNTER — Other Ambulatory Visit (INDEPENDENT_AMBULATORY_CARE_PROVIDER_SITE_OTHER): Payer: Medicare Other

## 2011-10-14 VITALS — BP 106/60 | HR 80 | Temp 98.0°F | Resp 16 | Wt 168.0 lb

## 2011-10-14 DIAGNOSIS — R772 Abnormality of alphafetoprotein: Secondary | ICD-10-CM

## 2011-10-14 DIAGNOSIS — E1149 Type 2 diabetes mellitus with other diabetic neurological complication: Secondary | ICD-10-CM | POA: Insufficient documentation

## 2011-10-14 DIAGNOSIS — B1710 Acute hepatitis C without hepatic coma: Secondary | ICD-10-CM

## 2011-10-14 DIAGNOSIS — I10 Essential (primary) hypertension: Secondary | ICD-10-CM

## 2011-10-14 DIAGNOSIS — M549 Dorsalgia, unspecified: Secondary | ICD-10-CM

## 2011-10-14 DIAGNOSIS — F17200 Nicotine dependence, unspecified, uncomplicated: Secondary | ICD-10-CM

## 2011-10-14 DIAGNOSIS — R799 Abnormal finding of blood chemistry, unspecified: Secondary | ICD-10-CM

## 2011-10-14 DIAGNOSIS — IMO0001 Reserved for inherently not codable concepts without codable children: Secondary | ICD-10-CM

## 2011-10-14 DIAGNOSIS — E039 Hypothyroidism, unspecified: Secondary | ICD-10-CM

## 2011-10-14 DIAGNOSIS — F329 Major depressive disorder, single episode, unspecified: Secondary | ICD-10-CM

## 2011-10-14 DIAGNOSIS — E876 Hypokalemia: Secondary | ICD-10-CM

## 2011-10-14 DIAGNOSIS — Z23 Encounter for immunization: Secondary | ICD-10-CM

## 2011-10-14 DIAGNOSIS — F3289 Other specified depressive episodes: Secondary | ICD-10-CM

## 2011-10-14 DIAGNOSIS — E559 Vitamin D deficiency, unspecified: Secondary | ICD-10-CM

## 2011-10-14 LAB — COMPREHENSIVE METABOLIC PANEL
Albumin: 3.6 g/dL (ref 3.5–5.2)
Alkaline Phosphatase: 77 U/L (ref 39–117)
BUN: 9 mg/dL (ref 6–23)
Creatinine, Ser: 0.8 mg/dL (ref 0.4–1.2)
Glucose, Bld: 86 mg/dL (ref 70–99)
Total Bilirubin: 0.9 mg/dL (ref 0.3–1.2)

## 2011-10-14 LAB — LIPID PANEL
HDL: 61.7 mg/dL (ref >39.00)
LDL Cholesterol: 67 mg/dL (ref 0–99)
Total CHOL/HDL Ratio: 2
VLDL: 11.4 mg/dL (ref 0.0–40.0)

## 2011-10-14 LAB — TSH: TSH: 2.32 u[IU]/mL (ref 0.35–5.50)

## 2011-10-14 LAB — CBC WITH DIFFERENTIAL/PLATELET
Basophils Relative: 0.4 % (ref 0.0–3.0)
HCT: 39.1 % (ref 36.0–46.0)
Hemoglobin: 13.2 g/dL (ref 12.0–15.0)
Lymphocytes Relative: 41.7 % (ref 12.0–46.0)
Lymphs Abs: 2.1 10*3/uL (ref 0.7–4.0)
Monocytes Relative: 10.2 % (ref 3.0–12.0)
Neutro Abs: 2.4 10*3/uL (ref 1.4–7.7)
RBC: 3.98 Mil/uL (ref 3.87–5.11)

## 2011-10-14 LAB — PROTIME-INR
INR: 1.1 ratio — ABNORMAL HIGH (ref 0.8–1.0)
Prothrombin Time: 12.1 s (ref 10.2–12.4)

## 2011-10-14 LAB — MAGNESIUM: Magnesium: 1.7 mg/dL (ref 1.5–2.5)

## 2011-10-14 MED ORDER — GABAPENTIN 300 MG PO CAPS: 300.0000 mg | ORAL_CAPSULE | Freq: Three times a day (TID) | ORAL | Status: DC

## 2011-10-14 MED ORDER — IBUPROFEN 600 MG PO TABS: 600.0000 mg | ORAL_TABLET | Freq: Three times a day (TID) | ORAL | Status: DC | PRN

## 2011-10-14 MED ORDER — ALPRAZOLAM 0.5 MG PO TABS: 0.5000 mg | ORAL_TABLET | Freq: Three times a day (TID) | ORAL | Status: DC

## 2011-10-14 NOTE — Patient Instructions (Signed)

## 2011-10-14 NOTE — Assessment & Plan Note (Signed)
For a TSH level today

## 2011-10-14 NOTE — Progress Notes (Signed)
Subjective:    Patient ID: Phyllis [name], female    DOB: [birth year], 56 y.o.   MRN: [id number]  Thyroid Problem Presents for follow-up visit. Patient reports no anxiety, cold intolerance, constipation, depressed mood, diaphoresis, diarrhea, dry skin, fatigue, hair loss, heat intolerance, hoarse voice, leg swelling, menstrual problem, nail problem, palpitations, tremors, visual change, weight gain or weight loss. The symptoms have been stable.  Hypertension This is a chronic problem. The current episode started more than 1 year ago. The problem has been gradually improving since onset. The problem is controlled. Pertinent negatives include no anxiety, blurred vision, chest pain, headaches, malaise/fatigue, neck pain, orthopnea, palpitations, peripheral edema, PND, shortness of breath or sweats. There are no associated agents to hypertension. Past treatments include angiotensin blockers and diuretics. The current treatment provides significant improvement. Compliance problems include exercise and diet.  Hypertensive end-organ damage includes a thyroid problem.      Review of Systems  Constitutional: Negative for fever, chills, weight loss, weight gain, malaise/fatigue, diaphoresis, activity change, appetite change, fatigue and unexpected weight change.  HENT: Negative.  Negative for hoarse voice and neck pain.   Eyes: Negative.  Negative for blurred vision.  Respiratory: Negative for cough, chest tightness, shortness of breath, wheezing and stridor.   Cardiovascular: Negative for chest pain, palpitations, orthopnea, leg swelling and PND.  Gastrointestinal: Negative for nausea, vomiting, abdominal pain, diarrhea, constipation and blood in stool.  Genitourinary: Negative for dysuria, urgency, frequency, hematuria, enuresis, difficulty urinating, menstrual problem and dyspareunia.  Musculoskeletal: Negative for myalgias, back pain, joint swelling, arthralgias and gait problem.  Skin: Negative  for color change, pallor, rash and wound.  Neurological: Negative for dizziness, tremors, seizures, syncope, facial asymmetry, speech difficulty, weakness, light-headedness, numbness and headaches.  Hematological: Negative for cold intolerance, heat intolerance and adenopathy. Does not bruise/bleed easily.  Psychiatric/Behavioral: Negative for suicidal ideas, hallucinations, behavioral problems, confusion, sleep disturbance, self-injury, dysphoric mood, decreased concentration and agitation. The patient is nervous/anxious. The patient is not hyperactive.        Objective:   Physical Exam  Vitals reviewed. Constitutional: She is oriented to person, place, and time. She appears well-developed and well-nourished. No distress.  HENT:  Head: Normocephalic and atraumatic.  Mouth/Throat: Oropharynx is clear and moist. No oropharyngeal exudate.  Eyes: Conjunctivae are normal. Right eye exhibits no discharge. Left eye exhibits no discharge. No scleral icterus.  Neck: Normal range of motion. Neck supple. No JVD present. No tracheal deviation present. No thyromegaly present.  Cardiovascular: Normal rate, regular rhythm, normal heart sounds and intact distal pulses.  Exam reveals no gallop and no friction rub.   No murmur heard. Pulmonary/Chest: Effort normal and breath sounds normal. No stridor. No respiratory distress. She has no wheezes. She has no rales. She exhibits no tenderness.  Abdominal: Soft. Bowel sounds are normal. She exhibits no distension and no mass. There is no tenderness. There is no rebound and no guarding.  Musculoskeletal: Normal range of motion. She exhibits no edema and no tenderness.  Lymphadenopathy:    She has no cervical adenopathy.  Neurological: She is oriented to person, place, and time.  Skin: Skin is warm and dry. No rash noted. She is not diaphoretic. No erythema. No pallor.  Psychiatric: She has a normal mood and affect. Her behavior is normal. Judgment and thought  content normal.      Lab Results  Component Value Date   WBC 5.7 04/15/2011   HGB 13.8 04/15/2011   HCT 40.7 04/15/2011   PLT 174.0  04/15/2011   GLUCOSE 61* 04/15/2011   CHOL 132 04/15/2011   TRIG 55.0 04/15/2011   HDL 60.30 04/15/2011   LDLCALC 61 04/15/2011   ALT 66* 04/01/2011   AST 86* 04/01/2011   NA 129* 04/15/2011   K 3.4* 04/15/2011   CL 92* 04/15/2011   CREATININE 0.6 04/15/2011   BUN 8 04/15/2011   CO2 31 04/15/2011   TSH 1.87 04/15/2011   INR 1.06 04/01/2011   HGBA1C 6.4 06/06/2009      Assessment & Plan:

## 2011-10-15 ENCOUNTER — Telehealth: Payer: Self-pay

## 2011-10-15 LAB — AFP TUMOR MARKER: AFP-Tumor Marker: 20.9 ng/mL — ABNORMAL HIGH (ref 0.0–8.0)

## 2011-10-15 NOTE — Telephone Encounter (Signed)
Patient called [facility] stating that she was notified about a upcoming U/S appt. Per MD test was ordered for hep C and to look for tumors in the liver. MD states this must be done every 18mo. Patient notified

## 2011-10-15 NOTE — Assessment & Plan Note (Signed)
Her afp is slightly elevated so I have ordered a liver u/s to look for tumors

## 2011-10-16 NOTE — Assessment & Plan Note (Signed)
Continue current meds 

## 2011-10-16 NOTE — Assessment & Plan Note (Signed)
I will check her a1c level today

## 2011-10-16 NOTE — Assessment & Plan Note (Signed)
I asked her to quit smoking

## 2011-10-16 NOTE — Assessment & Plan Note (Signed)
Her BP is well controlled, I will check her lytes and renal function today 

## 2011-10-16 NOTE — Assessment & Plan Note (Signed)
I will recheck her K+ level today

## 2011-10-16 NOTE — Assessment & Plan Note (Signed)
I will check her vit D level today

## 2011-10-19 ENCOUNTER — Ambulatory Visit
Admission: RE | Admit: 2011-10-19 | Discharge: 2011-10-19 | Disposition: A | Discharge status: Home / Self Care | Payer: Medicare Other | Source: Ambulatory Visit | Attending: Internal Medicine | Admitting: Internal Medicine

## 2011-10-19 DIAGNOSIS — R772 Abnormality of alphafetoprotein: Secondary | ICD-10-CM

## 2011-10-19 DIAGNOSIS — B1710 Acute hepatitis C without hepatic coma: Secondary | ICD-10-CM

## 2011-10-19 LAB — VITAMIN D 1,25 DIHYDROXY
Vitamin D 1, 25 (OH)2 Total: 61 pg/mL (ref 18–72)
Vitamin D3 1, 25 (OH)2: 61 pg/mL

## 2011-12-13 ENCOUNTER — Other Ambulatory Visit (INDEPENDENT_AMBULATORY_CARE_PROVIDER_SITE_OTHER): Payer: Medicare Other

## 2011-12-13 ENCOUNTER — Ambulatory Visit (INDEPENDENT_AMBULATORY_CARE_PROVIDER_SITE_OTHER): Payer: Medicare Other | Admitting: Internal Medicine

## 2011-12-13 ENCOUNTER — Encounter: Payer: Self-pay | Admitting: Internal Medicine

## 2011-12-13 DIAGNOSIS — B1710 Acute hepatitis C without hepatic coma: Secondary | ICD-10-CM

## 2011-12-13 DIAGNOSIS — I10 Essential (primary) hypertension: Secondary | ICD-10-CM

## 2011-12-13 DIAGNOSIS — E876 Hypokalemia: Secondary | ICD-10-CM

## 2011-12-13 DIAGNOSIS — Z0000 Encounter for general adult medical examination without abnormal findings: Secondary | ICD-10-CM

## 2011-12-13 DIAGNOSIS — F17200 Nicotine dependence, unspecified, uncomplicated: Secondary | ICD-10-CM

## 2011-12-13 DIAGNOSIS — J45909 Unspecified asthma, uncomplicated: Secondary | ICD-10-CM

## 2011-12-13 DIAGNOSIS — J4530 Mild persistent asthma, uncomplicated: Secondary | ICD-10-CM

## 2011-12-13 DIAGNOSIS — E039 Hypothyroidism, unspecified: Secondary | ICD-10-CM

## 2011-12-13 DIAGNOSIS — IMO0001 Reserved for inherently not codable concepts without codable children: Secondary | ICD-10-CM

## 2011-12-13 LAB — URINALYSIS, ROUTINE W REFLEX MICROSCOPIC
Bilirubin Urine: NEGATIVE
Hgb urine dipstick: NEGATIVE
Nitrite: NEGATIVE
Total Protein, Urine: NEGATIVE

## 2011-12-13 LAB — BASIC METABOLIC PANEL
BUN: 4 mg/dL — ABNORMAL LOW (ref 6–23)
Calcium: 9 mg/dL (ref 8.4–10.5)
Chloride: 97 mEq/L (ref 96–112)
Creatinine, Ser: 0.7 mg/dL (ref 0.4–1.2)

## 2011-12-13 MED ORDER — MOMETASONE FURO-FORMOTEROL FUM 100-5 MCG/ACT IN AERO: 2.0000 | INHALATION_SPRAY | Freq: Two times a day (BID) | RESPIRATORY_TRACT | Status: DC

## 2011-12-13 NOTE — Patient Instructions (Signed)
Diabetes, Type 2 Diabetes is a long-lasting (chronic) disease. In type 2 diabetes, the pancreas does not make enough insulin (a hormone), and the body does not respond normally to the insulin that is made. This type of diabetes was also previously called adult-onset diabetes. It usually occurs after the age of 69, but it can occur at any age.  CAUSES  Type 2 diabetes happens because the pancreasis not making enough insulin or your body has trouble using the insulin that your pancreas does make properly. SYMPTOMS   Drinking more than usual.   Urinating more than usual.   Blurred vision.   Dry, itchy skin.   Frequent infections.   Feeling more tired than usual (fatigue).  DIAGNOSIS The diagnosis of type 2 diabetes is usually made by one of the following tests:  Fasting blood glucose test. You will not eat for at least 8 hours and then take a blood test.   Random blood glucose test. Your blood glucose (sugar) is checked at any time of the day regardless of when you ate.   Oral glucose tolerance test (OGTT). Your blood glucose is measured after you have not eaten (fasted) and then after you drink a glucose containing beverage.  TREATMENT   Healthy eating.   Exercise.   Medicine, if needed.   Monitoring blood glucose.   Seeing your caregiver regularly.  HOME CARE INSTRUCTIONS   Check your blood glucose at least once a day. More frequent monitoring may be necessary, depending on your medicines and on how well your diabetes is controlled. Your caregiver will advise you.   Take your medicine as directed by your caregiver.   Do not smoke.   Make wise food choices. Ask your caregiver for information. Weight loss can improve your diabetes.   Learn about low blood glucose (hypoglycemia) and how to treat it.   Get your eyes checked regularly.   Have a yearly physical exam. Have your blood pressure checked and your blood and urine tested.   Wear a pendant or bracelet saying  that you have diabetes.   Check your feet every night for cuts, sores, blisters, and redness. Let your caregiver know if you have any problems.  SEEK MEDICAL CARE IF:   You have problems keeping your blood glucose in target range.   You have problems with your medicines.   You have symptoms of an illness that do not improve after 24 hours.   You have a sore or wound that is not healing.   You notice a change in vision or a new problem with your vision.   You have a fever.  MAKE SURE YOU:  Understand these instructions.   Will watch your condition.   Will get help right away if you are not doing well or get worse.  Document Released: 09/20/2005 Document Revised: 09/09/2011 Document Reviewed: 03/08/2011 [facility] Patient Information 2012 [place].Hypertension As your heart beats, it forces blood through your arteries. This force is your blood pressure. If the pressure is too high, it is called hypertension (HTN) or high blood pressure. HTN is dangerous because you may have it and not know it. High blood pressure may mean that your heart has to work harder to pump blood. Your arteries may be narrow or stiff. The extra work puts you at risk for heart disease, stroke, and other problems.  Blood pressure consists of two numbers, a higher number over a lower, 110/72, for example. It is stated as "110 over 72." The ideal  is below 120 for the top number (systolic) and under 80 for the bottom (diastolic). Write down your blood pressure today. You should pay close attention to your blood pressure if you have certain conditions such as:  Heart failure.   Prior heart attack.   Diabetes   Chronic kidney disease.   Prior stroke.   Multiple risk factors for heart disease.  To see if you have HTN, your blood pressure should be measured while you are seated with your arm held at the level of the heart. It should be measured at least twice. A one-time elevated blood pressure reading  (especially in the Emergency Department) does not mean that you need treatment. There may be conditions in which the blood pressure is different between your right and left arms. It is important to see your caregiver soon for a recheck. Most people have essential hypertension which means that there is not a specific cause. This type of high blood pressure may be lowered by changing lifestyle factors such as:  Stress.   Smoking.   Lack of exercise.   Excessive weight.   Drug/tobacco/alcohol use.   Eating less salt.  Most people do not have symptoms from high blood pressure until it has caused damage to the body. Effective treatment can often prevent, delay or reduce that damage. TREATMENT  When a cause has been identified, treatment for high blood pressure is directed at the cause. There are a large number of medications to treat HTN. These fall into several categories, and your caregiver will help you select the medicines that are best for you. Medications may have side effects. You should review side effects with your caregiver. If your blood pressure stays high after you have made lifestyle changes or started on medicines,   Your medication(s) may need to be changed.   Other problems may need to be addressed.   Be certain you understand your prescriptions, and know how and when to take your medicine.   Be sure to follow up with your caregiver within the time frame advised (usually within two weeks) to have your blood pressure rechecked and to review your medications.   If you are taking more than one medicine to lower your blood pressure, make sure you know how and at what times they should be taken. Taking two medicines at the same time can result in blood pressure that is too low.  SEEK IMMEDIATE MEDICAL CARE IF:  You develop a severe headache, blurred or changing vision, or confusion.   You have unusual weakness or numbness, or a faint feeling.   You have severe chest or  abdominal pain, vomiting, or breathing problems.  MAKE SURE YOU:   Understand these instructions.   Will watch your condition.   Will get help right away if you are not doing well or get worse.  Document Released: 09/20/2005 Document Revised: 09/09/2011 Document Reviewed: 05/10/2008 [facility] Patient Information 2012 [place].

## 2011-12-13 NOTE — Assessment & Plan Note (Signed)
Her recent u/s did not show any masses

## 2011-12-13 NOTE — Assessment & Plan Note (Signed)
Continue the current inhalers

## 2011-12-13 NOTE — Assessment & Plan Note (Signed)
Recent TSH looks good

## 2011-12-13 NOTE — Assessment & Plan Note (Signed)
Recent a1c looks good

## 2011-12-13 NOTE — Assessment & Plan Note (Signed)
She agrees to try to quit smoking

## 2011-12-13 NOTE — Assessment & Plan Note (Signed)
Her BP is well controlled, I will recheck her lytes today

## 2011-12-13 NOTE — Progress Notes (Signed)
Subjective:    Patient ID: Phyllis [name], female    DOB: 1955-10-26, 56 y.o.   MRN: [id number]  Hypertension This is a chronic problem. The current episode started more than 1 year ago. The problem has been gradually improving since onset. The problem is controlled. Pertinent negatives include no anxiety, blurred vision, chest pain, headaches, malaise/fatigue, neck pain, orthopnea, palpitations, peripheral edema, PND, shortness of breath or sweats. Past treatments include angiotensin blockers and diuretics. The current treatment provides moderate improvement. Compliance problems include exercise and diet.       Review of Systems  Constitutional: Negative for fever, chills, malaise/fatigue, diaphoresis, activity change, appetite change, fatigue and unexpected weight change.  HENT: Negative.  Negative for neck pain.   Eyes: Negative.  Negative for blurred vision.  Respiratory: Negative for apnea, cough, choking, chest tightness, shortness of breath, wheezing and stridor.   Cardiovascular: Negative for chest pain, palpitations, orthopnea, leg swelling and PND.  Gastrointestinal: Negative for nausea, vomiting, abdominal pain, diarrhea, constipation, blood in stool, abdominal distention and anal bleeding.  Genitourinary: Negative.   Musculoskeletal: Negative for myalgias, back pain, joint swelling, arthralgias and gait problem.  Skin: Negative for color change, pallor, rash and wound.  Neurological: Negative for dizziness, tremors, seizures, syncope, facial asymmetry, speech difficulty, weakness, light-headedness, numbness and headaches.  Hematological: Negative for adenopathy. Does not bruise/bleed easily.  Psychiatric/Behavioral: Negative.        Objective:   Physical Exam  Vitals reviewed. Constitutional: She is oriented to person, place, and time. She appears well-developed and well-nourished. No distress.  HENT:  Head: Normocephalic and atraumatic.  Mouth/Throat: Oropharynx is  clear and moist. No oropharyngeal exudate.  Eyes: Conjunctivae are normal. Right eye exhibits no discharge. Left eye exhibits no discharge. No scleral icterus.  Neck: Normal range of motion. Neck supple. No JVD present. No tracheal deviation present. No thyromegaly present.  Cardiovascular: Normal rate, regular rhythm, normal heart sounds and intact distal pulses.  Exam reveals no gallop and no friction rub.   No murmur heard. Pulmonary/Chest: Effort normal and breath sounds normal. No stridor. No respiratory distress. She has no wheezes. She has no rales. She exhibits no tenderness.  Abdominal: Soft. Bowel sounds are normal. She exhibits no distension and no mass. There is no tenderness. There is no rebound and no guarding.  Musculoskeletal: Normal range of motion. She exhibits no edema and no tenderness.  Lymphadenopathy:    She has no cervical adenopathy.  Neurological: She is oriented to person, place, and time.  Skin: Skin is warm and dry. No rash noted. She is not diaphoretic. No erythema. No pallor.  Psychiatric: She has a normal mood and affect. Her behavior is normal. Judgment and thought content normal.      Lab Results  Component Value Date   WBC 5.0 10/14/2011   HGB 13.2 10/14/2011   HCT 39.1 10/14/2011   PLT 188.0 10/14/2011   GLUCOSE 86 10/14/2011   CHOL 140 10/14/2011   TRIG 57.0 10/14/2011   HDL 61.70 10/14/2011   LDLCALC 67 10/14/2011   ALT 53* 10/14/2011   AST 70* 10/14/2011   NA 129* 10/14/2011   K 4.2 10/14/2011   CL 92* 10/14/2011   CREATININE 0.8 10/14/2011   BUN 9 10/14/2011   CO2 31 10/14/2011   TSH 2.32 10/14/2011   INR 1.1* 10/14/2011   HGBA1C 6.5 10/14/2011  US Abdomen Complete  10/19/2011  *RADIOLOGY REPORT*  Clinical Data:  History of acute hepatitis C, elevated alpha- fetoprotein, smoking history  COMPLETE ABDOMINAL ULTRASOUND  Comparison:  CT abdomen and pelvis of 04/17/2007  Findings:  Gallbladder:  The gallbladder has been resected previously.  Common bile duct:   The common bile duct is normal measuring 4.7 mm in diameter.  Liver:  The liver is inhomogeneous and slightly echogenic suggesting fatty infiltration.  No focal abnormality is seen.  IVC:  The IVC is unremarkable.  Pancreas:  The pancreas is largely obscured by bowel gas.  Spleen:  The spleen is normal measuring 6.0 cm sagittally.  Right Kidney:  No hydronephrosis is seen.  The right kidney measures 9.5 cm sagittally.  A cyst is present in the lower pole of 1.7 cm in diameter.  Left Kidney:  No hydronephrosis.  The left kidney measures 9.1 cm.  Abdominal aorta:  The abdominal aorta is normal in caliber.  IMPRESSION:  1.  Somewhat inhomogeneous and echogenic liver may indicate fatty infiltration.  No focal abnormality is seen. 2.  The pancreas is obscured by bowel gas. 3.  Prior cholecystectomy.  Original Report Authenticated By: Joretta Bachelor, M.D.     Assessment & Plan:

## 2011-12-14 ENCOUNTER — Encounter: Payer: Self-pay | Admitting: Internal Medicine

## 2012-01-25 ENCOUNTER — Encounter: Payer: Self-pay | Admitting: Internal Medicine

## 2012-01-25 ENCOUNTER — Ambulatory Visit (AMBULATORY_SURGERY_CENTER): Payer: Medicare Other | Admitting: *Deleted

## 2012-01-25 VITALS — Ht 65.0 in | Wt 174.2 lb

## 2012-01-25 DIAGNOSIS — Z1211 Encounter for screening for malignant neoplasm of colon: Secondary | ICD-10-CM

## 2012-01-25 MED ORDER — PEG-KCL-NACL-NASULF-NA ASC-C 100 G PO SOLR: ORAL | Status: DC

## 2012-02-08 ENCOUNTER — Ambulatory Visit (AMBULATORY_SURGERY_CENTER): Payer: Medicare Other | Admitting: Internal Medicine

## 2012-02-08 ENCOUNTER — Encounter: Payer: Self-pay | Admitting: Internal Medicine

## 2012-02-08 VITALS — BP 175/81 | HR 75 | Temp 98.3°F | Resp 18 | Ht 65.0 in | Wt 174.0 lb

## 2012-02-08 DIAGNOSIS — D126 Benign neoplasm of colon, unspecified: Secondary | ICD-10-CM

## 2012-02-08 DIAGNOSIS — Z1211 Encounter for screening for malignant neoplasm of colon: Secondary | ICD-10-CM

## 2012-02-08 MED ORDER — SODIUM CHLORIDE 0.9 % IV SOLN: 500.0000 mL | INTRAVENOUS | Status: DC

## 2012-02-08 NOTE — Op Note (Signed)
[facility] [address]. [place],   [id number]  COLONOSCOPY PROCEDURE REPORT  PATIENT:  Phyllis [name], Phyllis [name]  MR#:  [id number] BIRTHDATE:  01/03/1956, 66 yrs. old  GENDER:  female ENDOSCOPIST:  Lowella Bandy. Olevia Perches, MD REF. BY:  [name], M.D. PROCEDURE DATE:  02/08/2012 PROCEDURE:  Colonoscopy with snare polypectomy ASA CLASS:  Class II INDICATIONS:  Routine Risk Screening MEDICATIONS:   MAC sedation, administered by CRNA, propofol (Diprivan) 200 mcg  DESCRIPTION OF PROCEDURE:   After the risks and benefits and of the procedure were explained, informed consent was obtained. Digital rectal exam was performed and revealed no rectal masses. The LB CF-[id number] endoscope was introduced through the anus and advanced to the cecum, which was identified by both the appendix and ileocecal valve.  The quality of the prep was good, using MoviPrep.  The instrument was then slowly withdrawn as the colon was fully examined. <<PROCEDUREIMAGES>>  FINDINGS:  Multiple pedunculated polyps were found. 1 cm polyp at 120 cm Polyp was snared without cautery. Retrieval was successful (see image3 and image4). snare polyp  A lipoma was found (see image5). small lipoma at 15 cm  This was otherwise a normal examination of the colon (see image6, image1, and image2). Retroflexed views in the rectum revealed no abnormalities.    The scope was then withdrawn from the patient and the procedure completed.  COMPLICATIONS:  None ENDOSCOPIC IMPRESSION: 1) Pedunculated polyp, multiple 2) Lipoma 3) Otherwise normal examination RECOMMENDATIONS: 1) Await pathology results 2) High fiber diet.  REPEAT EXAM:  In 5 year(s) for.  ______________________________ Lowella Bandy. Olevia Perches, MD  CC:  n. eSIGNED:   Lowella Bandy. Davier Tramell at 02/08/2012 12:33 PM  Wynne Dust, [phone]

## 2012-02-08 NOTE — Patient Instructions (Signed)
Discharge instructions given with verbal understanding. YOU HAD AN ENDOSCOPIC PROCEDURE TODAY AT [facility]: Refer to the procedure report that was given to you for any specific questions about what was found during the examination.  If the procedure report does not answer your questions, please call your gastroenterologist to clarify.  If you requested that your care partner not be given the details of your procedure findings, then the procedure report has been included in a sealed envelope for you to review at your convenience later.  YOU SHOULD EXPECT: Some feelings of bloating in the abdomen. Passage of more gas than usual.  Walking can help get rid of the air that was put into your GI tract during the procedure and reduce the bloating. If you had a lower endoscopy (such as a colonoscopy or flexible sigmoidoscopy) you may notice spotting of blood in your stool or on the toilet paper. If you underwent a bowel prep for your procedure, then you may not have a normal bowel movement for a few days.  DIET: Your first meal following the procedure should be a light meal and then it is ok to progress to your normal diet.  A half-sandwich or bowl of soup is an example of a good first meal.  Heavy or fried foods are harder to digest and may make you feel nauseous or bloated.  Likewise meals heavy in dairy and vegetables can cause extra gas to form and this can also increase the bloating.  Drink plenty of fluids but you should avoid alcoholic beverages for 24 hours.  ACTIVITY: Your care partner should take you home directly after the procedure.  You should plan to take it easy, moving slowly for the rest of the day.  You can resume normal activity the day after the procedure however you should NOT DRIVE or use heavy machinery for 24 hours (because of the sedation medicines used during the test).    SYMPTOMS TO REPORT IMMEDIATELY: A gastroenterologist can be reached at any hour.  During normal  business hours, 8:30 AM to 5:00 PM Monday through Friday, call [phone].  After hours and on weekends, please call the GI answering service at [phone] who will take a message and have the physician on call contact you.   Following lower endoscopy (colonoscopy or flexible sigmoidoscopy):  Excessive amounts of blood in the stool  Significant tenderness or worsening of abdominal pains  Swelling of the abdomen that is new, acute  Fever of 100F or higher  FOLLOW UP: If any biopsies were taken you will be contacted by phone or by letter within the next 1-3 weeks.  Call your gastroenterologist if you have not heard about the biopsies in 3 weeks.  Our staff will call the home number listed on your records the next business day following your procedure to check on you and address any questions or concerns that you may have at that time regarding the information given to you following your procedure. This is a courtesy call and so if there is no answer at the home number and we have not heard from you through the emergency physician on call, we will assume that you have returned to your regular daily activities without incident.  SIGNATURES/CONFIDENTIALITY: You and/or your care partner have signed paperwork which will be entered into your electronic medical record.  These signatures attest to the fact that that the information above on your After Visit Summary has been reviewed and is understood.  Full  responsibility of the confidentiality of this discharge information lies with you and/or your care-partner.

## 2012-02-09 ENCOUNTER — Telehealth: Payer: Self-pay | Admitting: *Deleted

## 2012-02-09 NOTE — Telephone Encounter (Signed)
  Follow up Call-  Call back number 02/08/2012  Post procedure Call Back phone  # [phone]  Permission to leave phone message Yes     Patient questions:  Do you have a fever, pain , or abdominal swelling? no Pain Score  0 *  Have you tolerated food without any problems? yes  Have you been able to return to your normal activities? yes  Do you have any questions about your discharge instructions: Diet   no Medications  no Follow up visit  no  Do you have questions or concerns about your Care? no  Actions: * If pain score is 4 or above: No action needed, pain <4.

## 2012-02-14 ENCOUNTER — Encounter: Payer: Self-pay | Admitting: Internal Medicine

## 2012-03-15 ENCOUNTER — Encounter: Payer: Self-pay | Admitting: Internal Medicine

## 2012-03-15 ENCOUNTER — Other Ambulatory Visit (INDEPENDENT_AMBULATORY_CARE_PROVIDER_SITE_OTHER): Payer: Medicare Other

## 2012-03-15 ENCOUNTER — Ambulatory Visit (INDEPENDENT_AMBULATORY_CARE_PROVIDER_SITE_OTHER)
Admission: RE | Admit: 2012-03-15 | Discharge: 2012-03-15 | Disposition: A | Discharge status: Home / Self Care | Payer: Medicare Other | Source: Ambulatory Visit | Attending: Internal Medicine | Admitting: Internal Medicine

## 2012-03-15 ENCOUNTER — Ambulatory Visit (INDEPENDENT_AMBULATORY_CARE_PROVIDER_SITE_OTHER): Payer: Medicare Other | Admitting: Internal Medicine

## 2012-03-15 VITALS — BP 140/82 | HR 80 | Temp 97.6°F | Resp 20 | Wt 172.0 lb

## 2012-03-15 DIAGNOSIS — E039 Hypothyroidism, unspecified: Secondary | ICD-10-CM

## 2012-03-15 DIAGNOSIS — E876 Hypokalemia: Secondary | ICD-10-CM

## 2012-03-15 DIAGNOSIS — R10816 Epigastric abdominal tenderness: Secondary | ICD-10-CM

## 2012-03-15 DIAGNOSIS — I10 Essential (primary) hypertension: Secondary | ICD-10-CM

## 2012-03-15 LAB — COMPREHENSIVE METABOLIC PANEL
Albumin: 3.2 g/dL — ABNORMAL LOW (ref 3.5–5.2)
Alkaline Phosphatase: 81 U/L (ref 39–117)
CO2: 32 mEq/L (ref 19–32)
Calcium: 9.5 mg/dL (ref 8.4–10.5)
Chloride: 92 mEq/L — ABNORMAL LOW (ref 96–112)
Glucose, Bld: 85 mg/dL (ref 70–99)
Potassium: 3.8 mEq/L (ref 3.5–5.1)
Sodium: 130 mEq/L — ABNORMAL LOW (ref 135–145)
Total Protein: 7.2 g/dL (ref 6.0–8.3)

## 2012-03-15 LAB — CBC WITH DIFFERENTIAL/PLATELET
Eosinophils Relative: 1.2 % (ref 0.0–5.0)
Lymphocytes Relative: 26.4 % (ref 12.0–46.0)
Monocytes Absolute: 0.7 10*3/uL (ref 0.1–1.0)
Monocytes Relative: 11.6 % (ref 3.0–12.0)
Neutrophils Relative %: 60.4 % (ref 43.0–77.0)
Platelets: 177 10*3/uL (ref 150.0–400.0)
RBC: 4.32 Mil/uL (ref 3.87–5.11)
WBC: 5.9 10*3/uL (ref 4.5–10.5)

## 2012-03-15 LAB — URINALYSIS, ROUTINE W REFLEX MICROSCOPIC
Nitrite: NEGATIVE
Total Protein, Urine: NEGATIVE
Urine Glucose: NEGATIVE
pH: 7.5 (ref 5.0–8.0)

## 2012-03-15 LAB — AMYLASE: Amylase: 56 U/L (ref 27–131)

## 2012-03-15 LAB — LIPASE: Lipase: 20 U/L (ref 11.0–59.0)

## 2012-03-15 NOTE — Progress Notes (Signed)
Subjective:    Patient ID: Phyllis [name], female    DOB: 1956-01-11, 56 y.o.   MRN: [id number]  Abdominal Pain This is a recurrent problem. The current episode started more than 1 year ago. The onset quality is gradual. The problem occurs intermittently. The most recent episode lasted 3 weeks. The problem has been unchanged. The pain is located in the epigastric region. The pain is at a severity of 2/10. The pain is mild. The quality of the pain is aching and cramping. The abdominal pain does not radiate. Associated symptoms include anorexia and nausea. Pertinent negatives include no arthralgias, belching, constipation, diarrhea, dysuria, fever, flatus, frequency, headaches, hematochezia, hematuria, melena, myalgias, vomiting or weight loss. Nothing aggravates the pain. The pain is relieved by nothing. Her past medical history is significant for pancreatitis.      Review of Systems  Constitutional: Negative for fever, chills, weight loss, diaphoresis, activity change, appetite change, fatigue and unexpected weight change.  HENT: Negative.   Eyes: Negative.   Respiratory: Negative for cough, chest tightness, shortness of breath, wheezing and stridor.   Cardiovascular: Negative for chest pain, palpitations and leg swelling.  Gastrointestinal: Positive for nausea, abdominal pain and anorexia. Negative for vomiting, diarrhea, constipation, blood in stool, melena, hematochezia, abdominal distention, anal bleeding, rectal pain and flatus.  Genitourinary: Negative for dysuria, urgency, frequency, hematuria, flank pain, decreased urine volume, vaginal bleeding, vaginal discharge, enuresis, difficulty urinating, genital sores, vaginal pain, menstrual problem, pelvic pain and dyspareunia.  Musculoskeletal: Negative for myalgias, back pain, joint swelling, arthralgias and gait problem.  Skin: Negative for color change, pallor, rash and wound.  Neurological: Negative.  Negative for headaches.    Hematological: Negative for adenopathy. Does not bruise/bleed easily.  Psychiatric/Behavioral: Negative.        Objective:   Physical Exam  Vitals reviewed. Constitutional: She is oriented to person, place, and time. Vital signs are normal. She appears well-developed and well-nourished.  Non-toxic appearance. She does not have a sickly appearance. She does not appear ill. No distress.  HENT:  Head: Normocephalic and atraumatic.  Mouth/Throat: Oropharynx is clear and moist. No oropharyngeal exudate.  Eyes: Conjunctivae are normal. Right eye exhibits no discharge. Left eye exhibits no discharge. No scleral icterus.  Neck: Normal range of motion. Neck supple. No JVD present. No tracheal deviation present. No thyromegaly present.  Cardiovascular: Normal rate, regular rhythm, normal heart sounds and intact distal pulses.  Exam reveals no gallop and no friction rub.   No murmur heard. Pulmonary/Chest: Effort normal and breath sounds normal. No stridor. No respiratory distress. She has no wheezes. She has no rales. She exhibits no tenderness.  Abdominal: Soft. Bowel sounds are normal. She exhibits no distension and no mass. There is no tenderness. There is no rebound and no guarding.  Genitourinary: Rectum normal. Rectal exam shows no external hemorrhoid, no internal hemorrhoid, no fissure, no mass, no tenderness and anal tone normal. Guaiac negative stool.  Musculoskeletal: Normal range of motion. She exhibits no edema and no tenderness.  Lymphadenopathy:    She has no cervical adenopathy.  Neurological: She is oriented to person, place, and time.  Skin: Skin is warm and dry. No rash noted. She is not diaphoretic. No erythema. No pallor.  Psychiatric: She has a normal mood and affect. Her behavior is normal. Judgment normal.      Lab Results  Component Value Date   WBC 5.0 10/14/2011   HGB 13.2 10/14/2011   HCT 39.1 10/14/2011   PLT 188.0 10/14/2011  GLUCOSE 91 12/13/2011   CHOL 140  10/14/2011   TRIG 57.0 10/14/2011   HDL 61.70 10/14/2011   LDLCALC 67 10/14/2011   ALT 53* 10/14/2011   AST 70* 10/14/2011   NA 133* 12/13/2011   K 4.2 12/13/2011   CL 97 12/13/2011   CREATININE 0.7 12/13/2011   BUN 4* 12/13/2011   CO2 31 12/13/2011   TSH 2.32 10/14/2011   INR 1.1* 10/14/2011   HGBA1C 6.5 10/14/2011      Assessment & Plan:

## 2012-03-15 NOTE — Patient Instructions (Signed)
Abdominal Pain Abdominal pain can be caused by many things. Your caregiver decides the seriousness of your pain by an examination and possibly blood tests and X-rays. Many cases can be observed and treated at home. Most abdominal pain is not caused by a disease and will probably improve without treatment. However, in many cases, more time must pass before a clear cause of the pain can be found. Before that point, it may not be known if you need more testing, or if hospitalization or surgery is needed. HOME CARE INSTRUCTIONS   Do not take laxatives unless directed by your caregiver.   Take pain medicine only as directed by your caregiver.   Only take over-the-counter or prescription medicines for pain, discomfort, or fever as directed by your caregiver.   Try a clear liquid diet (broth, tea, or water) for as long as directed by your caregiver. Slowly move to a bland diet as tolerated.  SEEK IMMEDIATE MEDICAL CARE IF:   The pain does not go away.   You have a fever.   You keep throwing up (vomiting).   The pain is felt only in portions of the abdomen. Pain in the right side could possibly be appendicitis. In an adult, pain in the left lower portion of the abdomen could be colitis or diverticulitis.   You pass bloody or black tarry stools.  MAKE SURE YOU:   Understand these instructions.   Will watch your condition.   Will get help right away if you are not doing well or get worse.  Document Released: 06/30/2005 Document Revised: 09/09/2011 Document Reviewed: 05/08/2008 [facility] Patient Information 2012 [place].

## 2012-03-16 ENCOUNTER — Telehealth: Payer: Self-pay

## 2012-03-16 DIAGNOSIS — R9389 Abnormal findings on diagnostic imaging of other specified body structures: Secondary | ICD-10-CM

## 2012-03-16 MED ORDER — VALSARTAN-HYDROCHLOROTHIAZIDE 160-12.5 MG PO TABS: 1.0000 | ORAL_TABLET | Freq: Every day | ORAL | Status: DC

## 2012-03-16 NOTE — Assessment & Plan Note (Signed)
I will check her BMP today

## 2012-03-16 NOTE — Assessment & Plan Note (Signed)
She has a history of chronic pancreatitis and this sounds like a mild episode, she does not request anything for symptom relief, I have checked her labs and do not see any complications, her abd xray only shows calcium in the pancreas bit there is no evidence of SBO or ileus  03/16/12 I spoke to her today and she told me that she feels better and does not have any symptoms today that need attention, I asked her to return for a repeat xray of her chest in the next 1-2 days

## 2012-03-16 NOTE — Telephone Encounter (Signed)
Pt called stating she received call from MD this morning with lab results but she was not completely awake and does not remember advisement. Pt is requesting results and refill of BP med and xanax, please advise.

## 2012-03-16 NOTE — Telephone Encounter (Signed)
The xray of her abd showed possible fluid in her lungs so I have asked her to come back for a dedicated CXR, I can fill the xanax at that time

## 2012-03-16 NOTE — Assessment & Plan Note (Signed)
I will check her TSH today

## 2012-03-16 NOTE — Assessment & Plan Note (Signed)
Her BP is well controlled, I will check her lytes and renal function today 

## 2012-03-16 NOTE — Assessment & Plan Note (Signed)
BMP today

## 2012-03-17 ENCOUNTER — Ambulatory Visit: Payer: Medicare Other | Admitting: Internal Medicine

## 2012-03-20 ENCOUNTER — Ambulatory Visit (INDEPENDENT_AMBULATORY_CARE_PROVIDER_SITE_OTHER): Payer: Medicare Other | Admitting: Internal Medicine

## 2012-03-20 ENCOUNTER — Encounter: Payer: Self-pay | Admitting: Internal Medicine

## 2012-03-20 ENCOUNTER — Other Ambulatory Visit (INDEPENDENT_AMBULATORY_CARE_PROVIDER_SITE_OTHER): Payer: Medicare Other

## 2012-03-20 ENCOUNTER — Ambulatory Visit (INDEPENDENT_AMBULATORY_CARE_PROVIDER_SITE_OTHER)
Admission: RE | Admit: 2012-03-20 | Discharge: 2012-03-20 | Disposition: A | Discharge status: Home / Self Care | Payer: Medicare Other | Source: Ambulatory Visit | Attending: Internal Medicine | Admitting: Internal Medicine

## 2012-03-20 VITALS — BP 112/68 | HR 103 | Temp 98.3°F | Resp 16 | Wt 171.0 lb

## 2012-03-20 DIAGNOSIS — I2699 Other pulmonary embolism without acute cor pulmonale: Secondary | ICD-10-CM

## 2012-03-20 DIAGNOSIS — J811 Chronic pulmonary edema: Secondary | ICD-10-CM

## 2012-03-20 DIAGNOSIS — R9389 Abnormal findings on diagnostic imaging of other specified body structures: Secondary | ICD-10-CM

## 2012-03-20 DIAGNOSIS — I10 Essential (primary) hypertension: Secondary | ICD-10-CM

## 2012-03-20 LAB — CARDIAC PANEL
CK-MB: 1 ng/mL (ref 0.3–4.0)
Relative Index: 3 calc — ABNORMAL HIGH (ref 0.0–2.5)

## 2012-03-20 LAB — BASIC METABOLIC PANEL
Calcium: 9 mg/dL (ref 8.4–10.5)
Creatinine, Ser: 0.8 mg/dL (ref 0.4–1.2)
GFR: 93.51 mL/min (ref >60.00)

## 2012-03-20 NOTE — Progress Notes (Signed)
  Subjective:    Patient ID: Phyllis [name], female    DOB: 1956-06-19, 56 y.o.   MRN: [id number]  HPI She returns for f/up and she tells me that the abd pain has resolved. She had an abnormal CXR on her abd series last week and came back in today for a dedicated CXR which showed pulmonary edema, she has a chronic NP cough that has not recently worsened and she has no other symptoms that would suggest pneumonia but neither does she have any symptoms of heart failure.   Review of Systems  Constitutional: Negative for fever, chills, diaphoresis, activity change, appetite change, fatigue and unexpected weight change.  HENT: Negative.   Eyes: Negative.   Respiratory: Positive for cough (chronic). Negative for apnea, choking, chest tightness, shortness of breath, wheezing and stridor.   Cardiovascular: Negative for chest pain, palpitations and leg swelling.  Gastrointestinal: Negative for nausea, vomiting, abdominal pain, diarrhea, constipation and anal bleeding.  Genitourinary: Negative.   Musculoskeletal: Negative.   Skin: Negative for color change, pallor, rash and wound.  Neurological: Negative for dizziness, tremors, seizures, syncope, facial asymmetry, speech difficulty, weakness, light-headedness, numbness and headaches.  Hematological: Negative.   Psychiatric/Behavioral: Negative.        Objective:   Physical Exam  Vitals reviewed. Constitutional: She is oriented to person, place, and time. She appears well-developed and well-nourished.  Non-toxic appearance. She does not have a sickly appearance. She does not appear ill. No distress.  HENT:  Head: Normocephalic and atraumatic.  Mouth/Throat: No oropharyngeal exudate.  Eyes: Conjunctivae are normal. Right eye exhibits no discharge. Left eye exhibits no discharge. No scleral icterus.  Neck: Normal range of motion. Neck supple. No JVD present. No tracheal deviation present. No thyromegaly present.  Cardiovascular: Normal rate,  regular rhythm, normal heart sounds and intact distal pulses.  Exam reveals no gallop and no friction rub.   No murmur heard. Pulmonary/Chest: Effort normal. No accessory muscle usage or stridor. Not tachypneic. No respiratory distress. She has no decreased breath sounds. She has no wheezes. She has rhonchi in the right lower field and the left lower field. She has no rales. She exhibits no tenderness.  Abdominal: Soft. Bowel sounds are normal. She exhibits no distension and no mass. There is no tenderness. There is no rebound and no guarding.  Musculoskeletal: Normal range of motion. She exhibits no edema and no tenderness.  Lymphadenopathy:    She has no cervical adenopathy.  Neurological: She is oriented to person, place, and time.  Skin: Skin is warm and dry. No rash noted. She is not diaphoretic. No erythema. No pallor.  Psychiatric: She has a normal mood and affect. Her behavior is normal. Judgment and thought content normal.     Lab Results  Component Value Date   WBC 5.9 03/15/2012   HGB 14.1 03/15/2012   HCT 43.0 03/15/2012   PLT 177.0 03/15/2012   GLUCOSE 85 03/15/2012   CHOL 140 10/14/2011   TRIG 57.0 10/14/2011   HDL 61.70 10/14/2011   LDLCALC 67 10/14/2011   ALT 41* 03/15/2012   AST 65* 03/15/2012   NA 130* 03/15/2012   K 3.8 03/15/2012   CL 92* 03/15/2012   CREATININE 0.7 03/15/2012   BUN 4* 03/15/2012   CO2 32 03/15/2012   TSH 2.32 10/14/2011   INR 1.1* 10/14/2011   HGBA1C 6.5 10/14/2011       Assessment & Plan:

## 2012-03-20 NOTE — Patient Instructions (Signed)
Heart Failure Heart failure (HF) is a condition in which the heart has trouble pumping blood. This means your heart does not pump blood efficiently for your body to work well. In some cases of HF, fluid may back up into your lungs or you may have swelling (edema) in your lower legs. HF is a long-term (chronic) condition. It is important for you to take good care of yourself and follow your caregiver's treatment plan. CAUSES   Health conditions:   High blood pressure (hypertension) causes the heart muscle to work harder than normal. When pressure in the blood vessels is high, the heart needs to pump (contract) with more force in order to circulate blood throughout the body. High blood pressure eventually causes the heart to become stiff and weak.   Coronary artery disease (CAD) is the buildup of cholesterol and fat (plaques) in the arteries of the heart. The blockage in the arteries deprives the heart muscle of oxygen and blood. This can cause chest pain and may lead to a heart attack. High blood pressure can also contribute to CAD.   Heart attack (myocardial infarction) occurs when 1 or more arteries in the heart become blocked. The loss of oxygen damages the muscle tissue of the heart. When this happens, part of the heart muscle dies. The injured tissue does not contract as well and weakens the heart's ability to pump blood.   Abnormal heart valves can cause HF when the heart valves do not open and close properly. This makes the heart muscle pump harder to keep the blood flowing.   Heart muscle disease (cardiomyopathy or myocarditis) is damage to the heart muscle from a variety of causes. These can include drug or alcohol abuse, infections, or unknown reasons. These can increase the risk of HF.   Lung disease makes the heart work harder because the lungs do not work properly. This can cause a strain on the heart leading it to fail.   Diabetes increases the risk of HF. High blood sugar contributes  to high fat (lipid) levels in the blood. Diabetes can also cause slow damage to tiny blood vessels that carry important nutrients to the heart muscle. When the heart does not get enough oxygen and food, it can cause the heart to become weak and stiff. This leads to a heart that does not contract efficiently.   Other diseases can contribute to HF. These include abnormal heart rhythms, thyroid problems, and low blood counts (anemia).   Unhealthy lifestyle habits:   Obesity.   Smoking.   Eating foods high in fat and cholesterol.   Eating or drinking beverages high in salt.   Drug or alcohol abuse.   Lack of exercise.  SYMPTOMS  HF symptoms may vary and can be hard to detect. Symptoms may include:  Shortness of breath with activity, such as climbing stairs.   Persistent cough.   Swelling of the feet, ankles, legs, or abdomen.   Unexplained weight gain.   Difficulty breathing when lying flat.   Waking from sleep because of the need to sit up and get more air.   Rapid heartbeat.   Fatigue and loss of energy.   Feeling lightheaded or close to fainting.  DIAGNOSIS  A diagnosis of HF is based on your history, symptoms, physical examination, and diagnostic tests. Diagnostic tests for HF may include:  EKG.   Chest X-ray.   Blood tests.   Exercise stress test.   Blood oxygen test (arterial blood gas).   Evaluation  by a heart doctor (cardiologist).   Ultrasound evaluation of the heart (echocardiogram).   Heart artery test to look for blockages (angiogram).   Radioactive imaging to look at the heart (radionuclide test).  TREATMENT  Treatment is aimed at managing the symptoms of HF. Medicines, lifestyle changes, or surgical intervention may be necessary to treat HF.  Medicines to help treat HF may include:   Angiotensin-converting enzyme (ACE) inhibitors. These block the effects of a blood protein called angiotensin-converting enzyme. ACE inhibitors relax (dilate) the  blood vessels and help lower blood pressure. This decreases the workload of the heart, slows the progression of HF, and improves symptoms.   Angiotensin receptor blockers (ARBs). These medications work similar to ACE inhibitors. ARBs may be an alternative for people who cannot tolerate an ACE inhibitor.   Aldosterone antagonists. This medication helps get rid of extra fluid from your body. This lowers the volume of blood the heart has to pump.   Water pills (diuretics). Diuretics cause the kidneys to remove salt and water from the blood. The extra fluid is removed by urination. By removing extra fluid from the body, diuretics help lower the workload of the heart and help prevent fluid buildup in the lungs so breathing is easier.   Beta blockers. These prevent the heart from beating too fast and improve heart muscle strength. Beta blockers help maintain a normal heart rate, control blood pressure, and improve HF symptoms.   Digitalis. This increases the force of the heartbeat and may be helpful to people with HF or heart rhythm problems.   Healthy lifestyle changes include:   Stopping smoking.   Eating a healthy diet. Avoid foods high in fat. Avoid foods fried in oil or made with fat. A dietician can help with healthy food choices.   Limiting how much salt you eat.   Limiting alcohol intake to no more than 1 drink per day for women and 2 drinks per day for men. Drinking more than that is harmful to your heart. If your heart has already been damaged by alcohol or you have severe HF, drinking alcohol should be stopped completely.   Exercising as directed by your caregiver.   Surgical treatment for HF may include:   Procedures to open blocked arteries, repair damaged heart valves, or remove damaged heart muscle tissue.   A pacemaker to help heart muscle function and to control certain abnormal heart rhythms.   A defibrillator to possibly prevent sudden cardiac death.  HOME CARE  INSTRUCTIONS   Activity level. Your caregiver can help you determine what type of exercise program may be helpful. It is important to maintain your strength. Pace your physical activity to avoid shortness of breath or chest pain. Rest for 1 hour before and after meals. A cardiac rehabilitation program may be helpful to some people with HF.   Diet. Eat a heart healthy diet. Food choices should be low in saturated fat and cholesterol. Talk to a dietician to learn about heart healthy foods.   Salt intake. When you have HF, you need to limit the amount of salt you eat. Eat less than 1500 milligrams (mg) of salt per day or as recommended by your caregiver.   Weight monitoring. Weigh yourself every day. You should weigh yourself in the morning after you urinate and before you eat breakfast. Wear the same amount of clothing each time you weigh yourself. Record your weight daily. Bring your recorded weights to your clinic visits. Tell your caregiver right away if  you have gained 3 lb/1.4 kg in 1 day, or 5 lb/2.3 kg in a week or whatever amount you were told to report.   Blood pressure monitoring. This should be done as directed by your caregiver. A home blood pressure cuff can be purchased at a drugstore. Record your blood pressure numbers and bring them to your clinic visits. Tell your caregiver if you become dizzy or lightheaded upon standing up.   Smoking. If you are currently a smoker, it is time to quit. Nicotine makes your heart work harder by causing your blood vessels to constrict. Do not use nicotine gum or patches before talking to your caregiver.   Follow up. Be sure to schedule a follow-up visit with your caregiver. Keep all your appointments.  SEEK MEDICAL CARE IF:   Your weight increases by 3 lb/1.4 kg in 1 day or 5 lb/2.3 kg in a week.   You notice increasing shortness of breath that is unusual for you. This may happen during rest, sleep, or with activity.   You cough more than normal,  especially with physical activity.   You notice more swelling in your hands, feet, ankles, or belly (abdomen).   You are unable to sleep because it is hard to breathe.   You cough up bloody mucus (sputum).   You begin to feel "jumping" or "fluttering" sensations (palpitations) in your chest.  SEEK IMMEDIATE MEDICAL CARE IF:   You have severe chest pain or pressure which may include symptoms such as:   Pain or pressure in the arms, neck, jaw, or back.   Feeling sweaty.   Feeling sick to your stomach (nauseous).   Feeling short of breath while at rest.   Having a fast or irregular heartbeat.   You experience stroke symptoms. These symptoms include:   Facial weakness or numbness.   Weakness or numbness in an arm, leg, or on one side of your body.   Blurred vision.   Difficulty talking or thinking.   Dizziness or fainting.   Severe headache.  THESE ARE MEDICAL EMERGENCIES. Do not wait to see if the symptoms go away. Call your local emergency services (911 in [place] DO NOT drive yourself to the hospital. IMPORTANT  Make a list of every medicine, vitamin, or herbal supplement you are taking. Keep the list with you at all times. Show it to your caregiver at every visit. Keep the list up-to-date.   Ask your caregiver or pharmacist to write an explanation of each medicine you are taking. This should include:   Why you are taking it.   The possible side effects.   The best time of day to take it.   Foods to take with it or what foods to avoid.   When to stop taking it.  MAKE SURE YOU:   Understand these instructions.   Will watch your condition.   Will get help right away if you are not doing well or get worse.  Document Released: 09/20/2005 Document Revised: 09/09/2011 Document Reviewed: 01/02/2010 [facility] Patient Information 2012 [place].

## 2012-03-21 ENCOUNTER — Encounter: Payer: Self-pay | Admitting: Internal Medicine

## 2012-03-21 LAB — D-DIMER, QUANTITATIVE: D-Dimer, Quant: 0.38 ug/mL-FEU (ref 0.00–0.48)

## 2012-03-21 NOTE — Assessment & Plan Note (Signed)
Her BP is well controlled 

## 2012-03-21 NOTE — Assessment & Plan Note (Signed)
I do not think she has pna, I am concerned that she may have a cardiac cause for pulm edema, her EKG today is unremarkable, I have asked her to see cardiology for further evaluation

## 2012-04-10 ENCOUNTER — Ambulatory Visit: Payer: Medicare Other | Admitting: Internal Medicine

## 2012-04-11 ENCOUNTER — Institutional Professional Consult (permissible substitution): Payer: Medicare Other | Admitting: Cardiology

## 2012-04-12 ENCOUNTER — Institutional Professional Consult (permissible substitution): Payer: Medicare Other | Admitting: Cardiology

## 2012-04-14 ENCOUNTER — Encounter: Payer: Self-pay | Admitting: Cardiology

## 2012-04-14 ENCOUNTER — Ambulatory Visit (INDEPENDENT_AMBULATORY_CARE_PROVIDER_SITE_OTHER): Payer: Medicare Other | Admitting: Cardiology

## 2012-04-14 VITALS — BP 120/75 | HR 104 | Ht 65.0 in | Wt 176.0 lb

## 2012-04-14 DIAGNOSIS — J811 Chronic pulmonary edema: Secondary | ICD-10-CM

## 2012-04-14 LAB — BRAIN NATRIURETIC PEPTIDE: Pro B Natriuretic peptide (BNP): 7 pg/mL (ref 0.0–100.0)

## 2012-04-14 NOTE — Progress Notes (Signed)
HPI The patient presents for evaluation of an abnormal chest x-ray. She had a recent bout of her chronic pancreatitis. She had an abdominal series of x-rays which included some imaging of her lower lung fields. This was abnormal leading to a PA and lateral chest x-ray. I have reviewed these images. There was interstitial infiltrate it was felt to be consistent with edema. She's referred for further evaluation of pulmonary edema. Of note the patient has had no prior cardiac history. She does have a long-standing smoking history. She's had no prior diagnosis of lung disease. She actually denies any cardiovascular symptoms. She denies any shortness of breath, PND or orthopnea. She reports no palpitations, presyncope or syncope. She has had no chest pressure, neck or arm discomfort.  Allergies  Allergen Reactions  . Meperidine Hcl     hallucinations  . Naproxen     Extreme bruising    Current Outpatient Prescriptions  Medication Sig Dispense Refill  . ALPRAZolam (XANAX) 0.5 MG tablet Take 0.5 mg by mouth 3 (three) times daily. As needed      . gabapentin (NEURONTIN) 300 MG capsule Take 1 capsule (300 mg total) by mouth 3 (three) times daily.  90 capsule  11  . levothyroxine (SYNTHROID) 25 MCG tablet Take 25 mcg by mouth daily.        . mometasone-formoterol (DULERA) 100-5 MCG/ACT AERO Inhale 2 puffs into the lungs 2 (two) times daily.  1 Inhaler  11  . potassium chloride SA (KLOR-CON M15) 15 MEQ tablet Take 1 tablet (15 mEq total) by mouth 2 (two) times daily.  180 tablet  3  . triamcinolone (KENALOG) 0.1 % ointment Apply topically 3 (three) times daily.        . valsartan-hydrochlorothiazide (DIOVAN-HCT) 160-12.5 MG per tablet Take 1 tablet by mouth daily.  30 tablet  2    Past Medical History  Diagnosis Date  . Eczema   . ASTHMA 06/06/2009  . BACK PAIN 08/06/2010  . DEPRESSION 06/06/2009  . Edema 05/20/2010  . GERD 06/06/2009  . HEPATITIS C WITHOUT HEPATIC COMA 06/06/2009  . HYPERTENSION  06/06/2009  . OSTEOPOROSIS 06/06/2009  . 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 TOBACCO USE 06/05/2010  . Unspecified hypothyroidism 06/05/2010  . VITAMIN D DEFICIENCY 06/06/2009    Past Surgical History  Procedure Date  . Cholecystectomy   . Breast lumpectomy     benign, right  . Tubal ligation   . Tonsillectomy   . Lumbar laminectomy     Family History  Problem Relation Age of Onset  . COPD Father   . Hypertension Other   . Colon cancer Neg Hx   . Stomach cancer Neg Hx     History   Social History  . Marital Status: Married    Spouse Name: N/A    Number of Children: N/A  . Years of Education: N/A   Occupational History  . Not on file.   Social History Main Topics  . Smoking status: Current Everyday Smoker -- 1.0 packs/day for 35 years    Types: Cigarettes  . Smokeless tobacco: Never Used  . Alcohol Use: No  . Drug Use: No  . Sexually Active: Yes    Birth Control/ Protection: Surgical   Other Topics Concern  . Not on file   Social History Narrative  . No narrative on file    ROS:  Positive for asthma. Otherwise as stated in the history of present illness and negative for all other systems.  PHYSICAL EXAM BP 120/75  Pulse 104  Ht _0  (1.651 m)  Wt 176 lb (79.833 kg)  BMI 29.29 kg/m2  LMP 10/05/1999 GENERAL:  Well appearing HEENT:  Pupils equal round and reactive, fundi not visualized, oral mucosa unremarkable NECK:  No jugular venous distention, waveform within normal limits, carotid upstroke brisk and symmetric, no bruits, no thyromegaly LYMPHATICS:  No cervical, inguinal adenopathy LUNGS:  Clear to auscultation bilaterally BACK:  No CVA tenderness CHEST: Diffuse expiratory wheezing HEART:  PMI not displaced or sustained,S1 and S2 within normal limits, no S3, no S4, no clicks, no rubs, no murmurs ABD:  Flat, positive bowel sounds normal in frequency in pitch, no bruits, no rebound, no guarding, no midline pulsatile mass, no  hepatomegaly, no splenomegaly EXT:  2 plus pulses throughout, no edema, no cyanosis no clubbing SKIN:  No rashes no nodules NEURO:  Cranial nerves II through XII grossly intact, motor grossly intact throughout PSYCH:  Cognitively intact, oriented to person place and time   EKG:  Sinus rhythm, rate 87, axis within normal limits, intervals within normal limits, no acute ST-T wave changes.   ASSESSMENT AND PLAN  Pulmonary edema:  I will check a BNP level and an echocardiogram.  If this is normal no further cardiovascular testing will be indicated.  Tobacco: We discussed the need to quit smoking completely. She smokes with stress and we discussed trying to find some other method of dealing with this.  HTN:  Her blood pressure is well controlled. She will continue on the medicines as listed.

## 2012-04-14 NOTE — Patient Instructions (Addendum)
The current medical regimen is effective;  continue present plan and medications.  Your physician has requested that you have an echocardiogram. Echocardiography is a painless test that uses sound waves to create images of your heart. It provides your doctor with information about the size and shape of your heart and how well your heart's chambers and valves are working. This procedure takes approximately one hour. There are no restrictions for this procedure.  Please have blood work today (BNP)  Follow up will be based on the results of testing.

## 2012-04-19 ENCOUNTER — Encounter: Payer: Self-pay | Admitting: Internal Medicine

## 2012-04-19 ENCOUNTER — Ambulatory Visit (INDEPENDENT_AMBULATORY_CARE_PROVIDER_SITE_OTHER): Payer: Medicare Other | Admitting: Internal Medicine

## 2012-04-19 VITALS — BP 138/78 | HR 86 | Temp 97.5°F | Resp 16 | Wt 175.0 lb

## 2012-04-19 DIAGNOSIS — I10 Essential (primary) hypertension: Secondary | ICD-10-CM

## 2012-04-19 DIAGNOSIS — IMO0001 Reserved for inherently not codable concepts without codable children: Secondary | ICD-10-CM

## 2012-04-19 DIAGNOSIS — F341 Dysthymic disorder: Secondary | ICD-10-CM

## 2012-04-19 DIAGNOSIS — F418 Other specified anxiety disorders: Secondary | ICD-10-CM

## 2012-04-19 MED ORDER — ALPRAZOLAM 0.5 MG PO TABS: 0.5000 mg | ORAL_TABLET | Freq: Three times a day (TID) | ORAL | Status: DC

## 2012-04-19 NOTE — Patient Instructions (Signed)

## 2012-04-19 NOTE — Progress Notes (Signed)
Subjective:    Patient ID: Phyllis [name], female    DOB: 09/27/1956, 56 y.o.   MRN: [id number]  Hypertension This is a chronic problem. The current episode started more than 1 year ago. The problem has been gradually improving since onset. The problem is controlled. Associated symptoms include anxiety. Pertinent negatives include no blurred vision, chest pain, headaches, malaise/fatigue, neck pain, orthopnea, palpitations, peripheral edema, PND, shortness of breath or sweats. Agents associated with hypertension include thyroid hormones. Past treatments include angiotensin blockers and diuretics. The current treatment provides moderate improvement. Compliance problems include exercise and diet.  Hypertensive end-organ damage includes a thyroid problem.      Review of Systems  Constitutional: Negative for fever, chills, malaise/fatigue, diaphoresis, activity change, appetite change, fatigue and unexpected weight change.  HENT: Negative.  Negative for neck pain.   Eyes: Negative.  Negative for blurred vision.  Respiratory: Negative for cough, chest tightness, shortness of breath, wheezing and stridor.   Cardiovascular: Negative for chest pain, palpitations, orthopnea, leg swelling and PND.  Gastrointestinal: Negative for nausea, vomiting, abdominal pain, diarrhea, constipation and abdominal distention.  Genitourinary: Negative.   Musculoskeletal: Negative for myalgias, back pain, joint swelling, arthralgias and gait problem.  Skin: Negative for color change, pallor, rash and wound.  Neurological: Negative.  Negative for headaches.  Hematological: Negative for adenopathy. Does not bruise/bleed easily.  Psychiatric/Behavioral: Negative for suicidal ideas, hallucinations, behavioral problems, confusion, disturbed wake/sleep cycle, self-injury, dysphoric mood, decreased concentration and agitation. The patient is nervous/anxious. The patient is not hyperactive.        Objective:   Physical  Exam  Vitals reviewed. Constitutional: She is oriented to person, place, and time. She appears well-developed and well-nourished. No distress.  HENT:  Head: Normocephalic and atraumatic.  Mouth/Throat: Oropharynx is clear and moist. No oropharyngeal exudate.  Eyes: Conjunctivae are normal. Right eye exhibits no discharge. Left eye exhibits no discharge. No scleral icterus.  Neck: Normal range of motion. Neck supple. No JVD present. No tracheal deviation present. No thyromegaly present.  Cardiovascular: Normal rate, regular rhythm, normal heart sounds and intact distal pulses.  Exam reveals no gallop and no friction rub.   No murmur heard. Pulmonary/Chest: Effort normal and breath sounds normal. No stridor. No respiratory distress. She has no wheezes. She has no rales. She exhibits no tenderness.  Abdominal: Soft. Bowel sounds are normal. She exhibits no distension and no mass. There is no tenderness. There is no rebound and no guarding.  Musculoskeletal: Normal range of motion. She exhibits no edema and no tenderness.  Lymphadenopathy:    She has no cervical adenopathy.  Neurological: She is oriented to person, place, and time.  Skin: Skin is warm and dry. No rash noted. She is not diaphoretic. No erythema. No pallor.  Psychiatric: She has a normal mood and affect. Her behavior is normal. Judgment and thought content normal.      Lab Results  Component Value Date   WBC 5.9 03/15/2012   HGB 14.1 03/15/2012   HCT 43.0 03/15/2012   PLT 177.0 03/15/2012   GLUCOSE 81 03/20/2012   CHOL 140 10/14/2011   TRIG 57.0 10/14/2011   HDL 61.70 10/14/2011   LDLCALC 67 10/14/2011   ALT 41* 03/15/2012   AST 65* 03/15/2012   NA 138 03/20/2012   K 4.3 03/20/2012   CL 103 03/20/2012   CREATININE 0.8 03/20/2012   BUN 6 03/20/2012   CO2 32 03/20/2012   TSH 2.32 10/14/2011   INR 1.1* 10/14/2011  HGBA1C 6.5 10/14/2011      Assessment & Plan:

## 2012-04-20 ENCOUNTER — Encounter: Payer: Self-pay | Admitting: Internal Medicine

## 2012-04-20 NOTE — Assessment & Plan Note (Signed)
Her BP is well controlled 

## 2012-04-20 NOTE — Assessment & Plan Note (Signed)
She is not willing to start an antidepressant or to do any therapy, she wants to continue taking xanax as needed

## 2012-04-20 NOTE — Assessment & Plan Note (Signed)
I will recheck her a1c today

## 2012-04-26 ENCOUNTER — Ambulatory Visit (HOSPITAL_COMMUNITY): Payer: Medicare Other | Attending: Internal Medicine | Admitting: Radiology

## 2012-04-26 DIAGNOSIS — I319 Disease of pericardium, unspecified: Secondary | ICD-10-CM | POA: Insufficient documentation

## 2012-04-26 DIAGNOSIS — I10 Essential (primary) hypertension: Secondary | ICD-10-CM | POA: Insufficient documentation

## 2012-04-26 DIAGNOSIS — R0602 Shortness of breath: Secondary | ICD-10-CM

## 2012-04-26 DIAGNOSIS — I079 Rheumatic tricuspid valve disease, unspecified: Secondary | ICD-10-CM | POA: Insufficient documentation

## 2012-04-26 DIAGNOSIS — J811 Chronic pulmonary edema: Secondary | ICD-10-CM | POA: Insufficient documentation

## 2012-04-26 DIAGNOSIS — F17200 Nicotine dependence, unspecified, uncomplicated: Secondary | ICD-10-CM | POA: Insufficient documentation

## 2012-04-26 DIAGNOSIS — I379 Nonrheumatic pulmonary valve disorder, unspecified: Secondary | ICD-10-CM | POA: Insufficient documentation

## 2012-04-26 NOTE — Progress Notes (Signed)
Echocardiogram performed.  

## 2012-08-23 ENCOUNTER — Ambulatory Visit (INDEPENDENT_AMBULATORY_CARE_PROVIDER_SITE_OTHER): Payer: Medicare Other | Admitting: Internal Medicine

## 2012-08-23 ENCOUNTER — Encounter: Payer: Self-pay | Admitting: Internal Medicine

## 2012-08-23 ENCOUNTER — Other Ambulatory Visit (INDEPENDENT_AMBULATORY_CARE_PROVIDER_SITE_OTHER): Payer: Medicare Other

## 2012-08-23 VITALS — BP 140/84 | HR 94 | Temp 96.6°F | Resp 14 | Ht 66.0 in | Wt 153.2 lb

## 2012-08-23 DIAGNOSIS — B1710 Acute hepatitis C without hepatic coma: Secondary | ICD-10-CM

## 2012-08-23 DIAGNOSIS — F341 Dysthymic disorder: Secondary | ICD-10-CM

## 2012-08-23 DIAGNOSIS — E559 Vitamin D deficiency, unspecified: Secondary | ICD-10-CM

## 2012-08-23 DIAGNOSIS — G609 Hereditary and idiopathic neuropathy, unspecified: Secondary | ICD-10-CM

## 2012-08-23 DIAGNOSIS — IMO0001 Reserved for inherently not codable concepts without codable children: Secondary | ICD-10-CM

## 2012-08-23 DIAGNOSIS — I10 Essential (primary) hypertension: Secondary | ICD-10-CM

## 2012-08-23 DIAGNOSIS — M810 Age-related osteoporosis without current pathological fracture: Secondary | ICD-10-CM

## 2012-08-23 DIAGNOSIS — F418 Other specified anxiety disorders: Secondary | ICD-10-CM

## 2012-08-23 DIAGNOSIS — Z1231 Encounter for screening mammogram for malignant neoplasm of breast: Secondary | ICD-10-CM

## 2012-08-23 DIAGNOSIS — E039 Hypothyroidism, unspecified: Secondary | ICD-10-CM

## 2012-08-23 DIAGNOSIS — Z23 Encounter for immunization: Secondary | ICD-10-CM

## 2012-08-23 LAB — CBC WITH DIFFERENTIAL/PLATELET
Basophils Absolute: 0 10*3/uL (ref 0.0–0.1)
Lymphocytes Relative: 39.8 % (ref 12.0–46.0)
Monocytes Relative: 8.4 % (ref 3.0–12.0)
Platelets: 167 10*3/uL (ref 150.0–400.0)
RDW: 15.3 % — ABNORMAL HIGH (ref 11.5–14.6)

## 2012-08-23 LAB — TSH: TSH: 1.4 u[IU]/mL (ref 0.35–5.50)

## 2012-08-23 LAB — COMPREHENSIVE METABOLIC PANEL
ALT: 70 U/L — ABNORMAL HIGH (ref 0–35)
Albumin: 3.7 g/dL (ref 3.5–5.2)
CO2: 32 mEq/L (ref 19–32)
Calcium: 9.5 mg/dL (ref 8.4–10.5)
Chloride: 98 mEq/L (ref 96–112)
Creatinine, Ser: 0.7 mg/dL (ref 0.4–1.2)
GFR: 106.96 mL/min (ref >60.00)
Sodium: 134 mEq/L — ABNORMAL LOW (ref 135–145)
Total Protein: 8.2 g/dL (ref 6.0–8.3)

## 2012-08-23 LAB — URINALYSIS, ROUTINE W REFLEX MICROSCOPIC
Hgb urine dipstick: NEGATIVE
Leukocytes, UA: NEGATIVE
Nitrite: NEGATIVE
Total Protein, Urine: NEGATIVE

## 2012-08-23 LAB — HEMOGLOBIN A1C: Hgb A1c MFr Bld: 5.7 % (ref 4.6–6.5)

## 2012-08-23 MED ORDER — ALPRAZOLAM 0.5 MG PO TABS: 0.5000 mg | ORAL_TABLET | Freq: Three times a day (TID) | ORAL | Status: DC

## 2012-08-23 MED ORDER — PREGABALIN 75 MG PO CAPS: 75.0000 mg | ORAL_CAPSULE | Freq: Two times a day (BID) | ORAL | Status: DC

## 2012-08-23 NOTE — Patient Instructions (Signed)
Diabetes, Type 2 Diabetes is a long-lasting (chronic) disease. In type 2 diabetes, the pancreas does not make enough insulin (a hormone), and the body does not respond normally to the insulin that is made. This type of diabetes was also previously called adult-onset diabetes. It usually occurs after the age of 9, but it can occur at any age.  CAUSES  Type 2 diabetes happens because the pancreasis not making enough insulin or your body has trouble using the insulin that your pancreas does make properly. SYMPTOMS   Drinking more than usual.  Urinating more than usual.  Blurred vision.  Dry, itchy skin.  Frequent infections.  Feeling more tired than usual (fatigue). DIAGNOSIS The diagnosis of type 2 diabetes is usually made by one of the following tests:  Fasting blood glucose test. You will not eat for at least 8 hours and then take a blood test.  Random blood glucose test. Your blood glucose (sugar) is checked at any time of the day regardless of when you ate.  Oral glucose tolerance test (OGTT). Your blood glucose is measured after you have not eaten (fasted) and then after you drink a glucose containing beverage. TREATMENT   Healthy eating.  Exercise.  Medicine, if needed.  Monitoring blood glucose.  Seeing your caregiver regularly. HOME CARE INSTRUCTIONS   Check your blood glucose at least once a day. More frequent monitoring may be necessary, depending on your medicines and on how well your diabetes is controlled. Your caregiver will advise you.  Take your medicine as directed by your caregiver.  Do not smoke.  Make wise food choices. Ask your caregiver for information. Weight loss can improve your diabetes.  Learn about low blood glucose (hypoglycemia) and how to treat it.  Get your eyes checked regularly.  Have a yearly physical exam. Have your blood pressure checked and your blood and urine tested.  Wear a pendant or bracelet saying that you have  diabetes.  Check your feet every night for cuts, sores, blisters, and redness. Let your caregiver know if you have any problems. SEEK MEDICAL CARE IF:   You have problems keeping your blood glucose in target range.  You have problems with your medicines.  You have symptoms of an illness that do not improve after 24 hours.  You have a sore or wound that is not healing.  You notice a change in vision or a new problem with your vision.  You have a fever. MAKE SURE YOU:  Understand these instructions.  Will watch your condition.  Will get help right away if you are not doing well or get worse. Document Released: 09/20/2005 Document Revised: 12/13/2011 Document Reviewed: 03/08/2011 [facility] Patient Information 2013 [place].

## 2012-08-23 NOTE — Progress Notes (Signed)
Subjective:    Patient ID: Phyllis [name], female    DOB: [birth year], 56 y.o.   MRN: [id number]  Hypertension This is a chronic problem. The current episode started more than 1 year ago. The problem has been gradually improving since onset. The problem is controlled. Pertinent negatives include no blurred vision, chest pain, headaches, malaise/fatigue, neck pain, orthopnea, palpitations, peripheral edema, PND, shortness of breath or sweats. Risk factors for coronary artery disease include smoking/tobacco exposure. Past treatments include angiotensin blockers and diuretics. There are no compliance problems.   Diabetes She presents for her follow-up diabetic visit. She has type 2 diabetes mellitus. Her disease course has been stable. There are no hypoglycemic associated symptoms. Pertinent negatives for hypoglycemia include no confusion, dizziness, headaches, seizures, speech difficulty, sweats or tremors. Pertinent negatives for diabetes include no blurred vision, no chest pain, no fatigue, no foot ulcerations, no polydipsia, no polyphagia, no polyuria, no visual change, no weakness and no weight loss. There are no hypoglycemic complications. Symptoms are worsening. There are no diabetic complications. Current diabetic treatment includes diet. She is compliant with treatment all of the time. She is following a generally healthy diet. Meal planning includes avoidance of concentrated sweets. She never participates in exercise. There is no change in her home blood glucose trend. An ACE inhibitor/angiotensin II receptor blocker is being taken. She does not see a podiatrist.Eye exam is current.      Review of Systems  Constitutional: Negative for fever, chills, weight loss, malaise/fatigue, diaphoresis, activity change, appetite change, fatigue and unexpected weight change.  HENT: Negative.  Negative for neck pain.   Eyes: Negative.  Negative for blurred vision.  Respiratory: Negative for apnea, cough,  choking, chest tightness, shortness of breath, wheezing and stridor.   Cardiovascular: Negative for chest pain, palpitations, orthopnea, leg swelling and PND.  Gastrointestinal: Negative for nausea, abdominal pain, diarrhea, constipation and rectal pain.  Genitourinary: Negative.  Negative for polyuria.  Musculoskeletal: Positive for back pain (chronic,unchanged). Negative for myalgias, joint swelling, arthralgias and gait problem.  Skin: Negative for color change, rash and wound.  Neurological: Positive for numbness (burnnig,stinging,tingling in her legs and feet). Negative for dizziness, tremors, seizures, syncope, facial asymmetry, speech difficulty, weakness, light-headedness and headaches.  Hematological: Negative for polydipsia, polyphagia and adenopathy. Does not bruise/bleed easily.  Psychiatric/Behavioral: Negative for suicidal ideas, hallucinations, behavioral problems, confusion, sleep disturbance, self-injury, dysphoric mood, decreased concentration and agitation. The patient is not hyperactive.        Objective:   Physical Exam  Vitals reviewed. Constitutional: She is oriented to person, place, and time. She appears well-developed and well-nourished. No distress.  HENT:  Head: Normocephalic and atraumatic.  Mouth/Throat: Oropharynx is clear and moist. No oropharyngeal exudate.  Eyes: Conjunctivae normal are normal. Right eye exhibits no discharge. Left eye exhibits no discharge. No scleral icterus.  Neck: Normal range of motion. Neck supple. No JVD present. No tracheal deviation present. No thyromegaly present.  Cardiovascular: Normal rate, regular rhythm, normal heart sounds and intact distal pulses.  Exam reveals no gallop and no friction rub.   No murmur heard. Pulmonary/Chest: Effort normal and breath sounds normal. No stridor. No respiratory distress. She has no rales. She exhibits no tenderness.  Abdominal: Soft. Bowel sounds are normal. She exhibits no distension and no  mass. There is no tenderness. There is no rebound and no guarding.  Musculoskeletal: Normal range of motion. She exhibits no edema and no tenderness.  Lymphadenopathy:    She has no cervical adenopathy.  Neurological: She is alert and oriented to person, place, and time. She displays no atrophy, no tremor and normal reflexes. No cranial nerve deficit or sensory deficit. She exhibits normal muscle tone. She displays no seizure activity. Coordination and gait normal. She displays no Babinski's sign on the right side. She displays no Babinski's sign on the left side.  Reflex Scores:      Tricep reflexes are 1+ on the right side and 1+ on the left side.      Bicep reflexes are 1+ on the right side and 1+ on the left side.      Brachioradialis reflexes are 1+ on the right side and 1+ on the left side.      Patellar reflexes are 0 on the right side and 0 on the left side.      Achilles reflexes are 0 on the right side and 0 on the left side. Skin: Skin is warm and dry. No rash noted. She is not diaphoretic. No erythema. No pallor.  Psychiatric: She has a normal mood and affect. Her behavior is normal. Judgment and thought content normal.          Assessment & Plan:

## 2012-08-24 LAB — DRUGS OF ABUSE SCREEN W/O ALC, ROUTINE URINE
Amphetamine Screen, Ur: NEGATIVE
Benzodiazepines.: POSITIVE — AB
Cocaine Metabolites: NEGATIVE
Marijuana Metabolite: NEGATIVE
Methadone: NEGATIVE

## 2012-08-24 NOTE — Assessment & Plan Note (Signed)
Vit D level today

## 2012-08-24 NOTE — Assessment & Plan Note (Signed)
She will continue her current meds, I will check a UDS to see that she is compliant and to look for substance abuse

## 2012-08-24 NOTE — Assessment & Plan Note (Addendum)
She will try lyrica instead of neurontin to see if that helps with her neuropathy s/s, I have also offered her a neuro referral

## 2012-08-24 NOTE — Assessment & Plan Note (Signed)
She needs a f/up with the Hep C clinic

## 2012-08-24 NOTE — Assessment & Plan Note (Signed)
Her BP is well controlled, I will check her lytes and renal function 

## 2012-08-24 NOTE — Assessment & Plan Note (Signed)
I will check her TSH today and will adjust her dose if needed

## 2012-08-24 NOTE — Assessment & Plan Note (Signed)
She needs a f/up DEXA scan

## 2012-08-27 ENCOUNTER — Encounter: Payer: Self-pay | Admitting: Internal Medicine

## 2012-08-27 LAB — BENZODIAZEPINES (GC/LC/MS), URINE
Alprazolam metabolite (GC/LC/MS), ur confirm: 163 ng/mL
Diazepam (GC/LC/MS), ur confirm: NEGATIVE ng/mL
Midazolam (GC/LC/MS), ur confirm: NEGATIVE ng/mL
Oxazepam (GC/LC/MS), ur confirm: NEGATIVE ng/mL
Temazepam (GC/LC/MS), ur confirm: NEGATIVE ng/mL
Triazolam metabolite (GC/LC/MS), ur confirm: NEGATIVE ng/mL

## 2012-08-27 LAB — VITAMIN D 1,25 DIHYDROXY
Vitamin D 1, 25 (OH)2 Total: 77 pg/mL — ABNORMAL HIGH (ref 18–72)
Vitamin D2 1, 25 (OH)2: 12 pg/mL

## 2012-08-27 LAB — OPIATES/OPIOIDS (LC/MS-MS)
Codeine Urine: 2495 ng/mL
Hydrocodone: 8702 ng/mL
Hydromorphone: 671 ng/mL
Norhydrocodone, Ur: >10000 ng/mL
Oxycodone, ur: NEGATIVE ng/mL

## 2012-12-11 ENCOUNTER — Other Ambulatory Visit: Payer: Self-pay | Admitting: *Deleted

## 2012-12-11 DIAGNOSIS — B1920 Unspecified viral hepatitis C without hepatic coma: Secondary | ICD-10-CM

## 2012-12-15 ENCOUNTER — Ambulatory Visit
Admission: RE | Admit: 2012-12-15 | Discharge: 2012-12-15 | Disposition: A | Discharge status: Home / Self Care | Payer: Medicare Other | Source: Ambulatory Visit | Attending: *Deleted | Admitting: *Deleted

## 2012-12-15 DIAGNOSIS — B1920 Unspecified viral hepatitis C without hepatic coma: Secondary | ICD-10-CM

## 2012-12-21 ENCOUNTER — Ambulatory Visit (INDEPENDENT_AMBULATORY_CARE_PROVIDER_SITE_OTHER): Payer: Medicare Other | Admitting: Internal Medicine

## 2012-12-21 ENCOUNTER — Telehealth: Payer: Self-pay | Admitting: *Deleted

## 2012-12-21 ENCOUNTER — Other Ambulatory Visit (INDEPENDENT_AMBULATORY_CARE_PROVIDER_SITE_OTHER): Payer: Medicare Other

## 2012-12-21 ENCOUNTER — Encounter: Payer: Self-pay | Admitting: Internal Medicine

## 2012-12-21 VITALS — BP 120/76 | HR 90 | Temp 97.5°F | Resp 16 | Wt 158.0 lb

## 2012-12-21 DIAGNOSIS — E1149 Type 2 diabetes mellitus with other diabetic neurological complication: Secondary | ICD-10-CM

## 2012-12-21 DIAGNOSIS — M549 Dorsalgia, unspecified: Secondary | ICD-10-CM

## 2012-12-21 DIAGNOSIS — F418 Other specified anxiety disorders: Secondary | ICD-10-CM

## 2012-12-21 DIAGNOSIS — F341 Dysthymic disorder: Secondary | ICD-10-CM

## 2012-12-21 DIAGNOSIS — Z1231 Encounter for screening mammogram for malignant neoplasm of breast: Secondary | ICD-10-CM

## 2012-12-21 DIAGNOSIS — I10 Essential (primary) hypertension: Secondary | ICD-10-CM

## 2012-12-21 DIAGNOSIS — E039 Hypothyroidism, unspecified: Secondary | ICD-10-CM

## 2012-12-21 LAB — COMPREHENSIVE METABOLIC PANEL
AST: 79 U/L — ABNORMAL HIGH (ref 0–37)
Alkaline Phosphatase: 118 U/L — ABNORMAL HIGH (ref 39–117)
BUN: 6 mg/dL (ref 6–23)
Creatinine, Ser: 0.7 mg/dL (ref 0.4–1.2)
Total Bilirubin: 1 mg/dL (ref 0.3–1.2)

## 2012-12-21 LAB — LIPID PANEL
HDL: 40.6 mg/dL (ref >39.00)
LDL Cholesterol: 61 mg/dL (ref 0–99)
Total CHOL/HDL Ratio: 3
Triglycerides: 59 mg/dL (ref 0.0–149.0)

## 2012-12-21 LAB — CBC WITH DIFFERENTIAL/PLATELET
Basophils Relative: 0.5 % (ref 0.0–3.0)
Eosinophils Absolute: 0 10*3/uL (ref 0.0–0.7)
Hemoglobin: 13.3 g/dL (ref 12.0–15.0)
Lymphocytes Relative: 45.9 % (ref 12.0–46.0)
MCHC: 33.3 g/dL (ref 30.0–36.0)
MCV: 98.3 fl (ref 78.0–100.0)
Monocytes Absolute: 0.4 10*3/uL (ref 0.1–1.0)
Neutro Abs: 1.9 10*3/uL (ref 1.4–7.7)
RBC: 4.06 Mil/uL (ref 3.87–5.11)

## 2012-12-21 MED ORDER — ALPRAZOLAM 0.5 MG PO TABS: 0.5000 mg | ORAL_TABLET | Freq: Three times a day (TID) | ORAL | Status: DC

## 2012-12-21 NOTE — Assessment & Plan Note (Signed)
I will check her a1c and will monitor her renal function

## 2012-12-21 NOTE — Assessment & Plan Note (Signed)
Continue xanax as needed She is not willing to take an SSRI

## 2012-12-21 NOTE — Patient Instructions (Signed)

## 2012-12-21 NOTE — Assessment & Plan Note (Signed)
No changes noted

## 2012-12-21 NOTE — Telephone Encounter (Signed)
Pt called about a question she had for Dr. Ronnald Ramp, left message for pt to callback office.

## 2012-12-21 NOTE — Progress Notes (Signed)
Subjective:    Patient ID: Phyllis [name], female    DOB: 09/17/1956, 57 y.o.   MRN: [id number]  Hypertension This is a chronic problem. The current episode started more than 1 year ago. The problem has been gradually improving since onset. The problem is controlled. Associated symptoms include anxiety. Pertinent negatives include no blurred vision, chest pain, headaches, malaise/fatigue, neck pain, orthopnea, palpitations, peripheral edema, PND, shortness of breath or sweats. Risk factors for coronary artery disease include smoking/tobacco exposure. Past treatments include angiotensin blockers and diuretics. Compliance problems include exercise and diet.       Review of Systems  Constitutional: Negative.  Negative for malaise/fatigue.  HENT: Negative.  Negative for neck pain.   Eyes: Negative.  Negative for blurred vision.  Respiratory: Negative.  Negative for cough, choking, chest tightness, shortness of breath, wheezing and stridor.   Cardiovascular: Negative.  Negative for chest pain, palpitations, orthopnea, leg swelling and PND.  Gastrointestinal: Negative.  Negative for nausea, vomiting, abdominal pain, diarrhea and constipation.  Endocrine: Negative.   Genitourinary: Negative.   Musculoskeletal: Positive for back pain (chronic,unchanged). Negative for myalgias, joint swelling, arthralgias and gait problem.  Skin: Negative.  Negative for color change, pallor, rash and wound.  Allergic/Immunologic: Negative.   Neurological: Negative.  Negative for dizziness, tremors, seizures, syncope, facial asymmetry, speech difficulty, weakness, light-headedness, numbness and headaches.  Hematological: Negative.  Negative for adenopathy. Does not bruise/bleed easily.  Psychiatric/Behavioral: Negative for suicidal ideas, hallucinations, behavioral problems, confusion, sleep disturbance, self-injury, dysphoric mood, decreased concentration and agitation. The patient is nervous/anxious. The patient  is not hyperactive.        Objective:   Physical Exam  Vitals reviewed. Constitutional: She is oriented to person, place, and time. She appears well-developed and well-nourished. No distress.  HENT:  Head: Normocephalic and atraumatic.  Mouth/Throat: Oropharynx is clear and moist. No oropharyngeal exudate.  Eyes: Conjunctivae are normal. Right eye exhibits no discharge. Left eye exhibits no discharge. No scleral icterus.  Neck: Normal range of motion. Neck supple. No JVD present. No tracheal deviation present. No thyromegaly present.  Cardiovascular: Normal rate, regular rhythm, normal heart sounds and intact distal pulses.  Exam reveals no gallop and no friction rub.   No murmur heard. Pulmonary/Chest: Effort normal and breath sounds normal. No stridor. No respiratory distress. She has no wheezes. She has no rales. She exhibits no tenderness.  Abdominal: Soft. Bowel sounds are normal. She exhibits no distension and no mass. There is no tenderness. There is no rebound and no guarding.  Musculoskeletal: Normal range of motion. She exhibits no edema and no tenderness.  Lymphadenopathy:    She has no cervical adenopathy.  Neurological: She is oriented to person, place, and time.  Skin: Skin is warm and dry. No rash noted. She is not diaphoretic. No erythema. No pallor.  Psychiatric: She has a normal mood and affect. Her behavior is normal. Judgment and thought content normal.      Lab Results  Component Value Date   WBC 6.8 08/23/2012   HGB 15.3* 08/23/2012   HCT 46.2* 08/23/2012   PLT 167.0 08/23/2012   GLUCOSE 94 08/23/2012   CHOL 140 10/14/2011   TRIG 57.0 10/14/2011   HDL 61.70 10/14/2011   LDLCALC 67 10/14/2011   ALT 70* 08/23/2012   AST 118* 08/23/2012   NA 134* 08/23/2012   K 3.5 08/23/2012   CL 98 08/23/2012   CREATININE 0.7 08/23/2012   BUN 8 08/23/2012   CO2 32 08/23/2012  TSH 1.40 08/23/2012   INR 1.1* 10/14/2011   HGBA1C 5.7 08/23/2012      Assessment & Plan:

## 2012-12-21 NOTE — Assessment & Plan Note (Signed)
Her BP is well controlled Today I will check her lytes and renal function

## 2012-12-21 NOTE — Assessment & Plan Note (Signed)
I will check her TSH today and will adjust her dose if needed 

## 2012-12-22 LAB — URINALYSIS, ROUTINE W REFLEX MICROSCOPIC
Hgb urine dipstick: NEGATIVE
Leukocytes, UA: NEGATIVE
Nitrite: NEGATIVE
Specific Gravity, Urine: <=1.005 (ref 1.000–1.030)
Urobilinogen, UA: 4 (ref 0.0–1.0)

## 2012-12-22 MED ORDER — GABAPENTIN 300 MG PO CAPS: 300.0000 mg | ORAL_CAPSULE | Freq: Three times a day (TID) | ORAL | Status: DC

## 2012-12-22 NOTE — Telephone Encounter (Signed)
Pt wants to know if she can go back to taking the Gabapentin in place of Lyrica, Lyrica is no longer working for her.

## 2012-12-22 NOTE — Telephone Encounter (Signed)
Left message for pt to callback office.

## 2012-12-22 NOTE — Telephone Encounter (Signed)
yes

## 2012-12-22 NOTE — Telephone Encounter (Signed)
Pt informed and rx for Gabapentin sent to pharmacy.

## 2012-12-24 ENCOUNTER — Encounter: Payer: Self-pay | Admitting: Internal Medicine

## 2013-01-01 ENCOUNTER — Ambulatory Visit (HOSPITAL_COMMUNITY)
Admission: RE | Admit: 2013-01-01 | Discharge: 2013-01-01 | Disposition: A | Discharge status: Home / Self Care | Payer: Medicare Other | Source: Ambulatory Visit | Attending: Internal Medicine | Admitting: Internal Medicine

## 2013-01-01 ENCOUNTER — Other Ambulatory Visit: Payer: Self-pay

## 2013-01-01 DIAGNOSIS — Z13820 Encounter for screening for osteoporosis: Secondary | ICD-10-CM | POA: Insufficient documentation

## 2013-01-01 DIAGNOSIS — Z780 Asymptomatic menopausal state: Secondary | ICD-10-CM | POA: Insufficient documentation

## 2013-01-01 DIAGNOSIS — M810 Age-related osteoporosis without current pathological fracture: Secondary | ICD-10-CM

## 2013-01-01 DIAGNOSIS — Z1231 Encounter for screening mammogram for malignant neoplasm of breast: Secondary | ICD-10-CM | POA: Insufficient documentation

## 2013-01-01 NOTE — Telephone Encounter (Signed)
Pt advised, Hydrocodone refill request denied per provider.

## 2013-01-11 ENCOUNTER — Other Ambulatory Visit: Payer: Self-pay | Admitting: Internal Medicine

## 2013-02-06 ENCOUNTER — Ambulatory Visit (INDEPENDENT_AMBULATORY_CARE_PROVIDER_SITE_OTHER): Payer: Medicare Other

## 2013-02-06 DIAGNOSIS — M810 Age-related osteoporosis without current pathological fracture: Secondary | ICD-10-CM

## 2013-02-06 MED ORDER — DENOSUMAB 60 MG/ML ~~LOC~~ SOLN
60.0000 mg | Freq: Once | SUBCUTANEOUS | Status: AC
  Administered 2013-02-06: 60 mg via SUBCUTANEOUS

## 2013-02-09 ENCOUNTER — Other Ambulatory Visit: Payer: Self-pay | Admitting: Internal Medicine

## 2013-03-05 ENCOUNTER — Inpatient Hospital Stay (HOSPITAL_COMMUNITY): Payer: Medicare Other

## 2013-03-05 ENCOUNTER — Emergency Department (HOSPITAL_COMMUNITY): Payer: Medicare Other

## 2013-03-05 ENCOUNTER — Inpatient Hospital Stay (HOSPITAL_COMMUNITY)
Admission: EM | Admit: 2013-03-05 | Discharge: 2013-03-08 | DRG: 871 | Disposition: A | Discharge status: Home / Self Care | Payer: Medicare Other | Attending: Internal Medicine | Admitting: Internal Medicine

## 2013-03-05 ENCOUNTER — Encounter (HOSPITAL_COMMUNITY): Payer: Self-pay | Admitting: Emergency Medicine

## 2013-03-05 DIAGNOSIS — J45901 Unspecified asthma with (acute) exacerbation: Secondary | ICD-10-CM | POA: Diagnosis present

## 2013-03-05 DIAGNOSIS — T400X1A Poisoning by opium, accidental (unintentional), initial encounter: Secondary | ICD-10-CM | POA: Diagnosis present

## 2013-03-05 DIAGNOSIS — G894 Chronic pain syndrome: Secondary | ICD-10-CM | POA: Diagnosis present

## 2013-03-05 DIAGNOSIS — I10 Essential (primary) hypertension: Secondary | ICD-10-CM | POA: Diagnosis present

## 2013-03-05 DIAGNOSIS — M810 Age-related osteoporosis without current pathological fracture: Secondary | ICD-10-CM | POA: Diagnosis present

## 2013-03-05 DIAGNOSIS — D6959 Other secondary thrombocytopenia: Secondary | ICD-10-CM | POA: Diagnosis present

## 2013-03-05 DIAGNOSIS — R6520 Severe sepsis without septic shock: Secondary | ICD-10-CM | POA: Diagnosis present

## 2013-03-05 DIAGNOSIS — E872 Acidosis, unspecified: Secondary | ICD-10-CM | POA: Diagnosis present

## 2013-03-05 DIAGNOSIS — A419 Sepsis, unspecified organism: Principal | ICD-10-CM | POA: Diagnosis present

## 2013-03-05 DIAGNOSIS — B1710 Acute hepatitis C without hepatic coma: Secondary | ICD-10-CM

## 2013-03-05 DIAGNOSIS — Z0000 Encounter for general adult medical examination without abnormal findings: Secondary | ICD-10-CM

## 2013-03-05 DIAGNOSIS — G9340 Encephalopathy, unspecified: Secondary | ICD-10-CM | POA: Diagnosis present

## 2013-03-05 DIAGNOSIS — J9600 Acute respiratory failure, unspecified whether with hypoxia or hypercapnia: Secondary | ICD-10-CM | POA: Diagnosis present

## 2013-03-05 DIAGNOSIS — E1149 Type 2 diabetes mellitus with other diabetic neurological complication: Secondary | ICD-10-CM

## 2013-03-05 DIAGNOSIS — E559 Vitamin D deficiency, unspecified: Secondary | ICD-10-CM | POA: Diagnosis present

## 2013-03-05 DIAGNOSIS — E039 Hypothyroidism, unspecified: Secondary | ICD-10-CM | POA: Diagnosis present

## 2013-03-05 DIAGNOSIS — I369 Nonrheumatic tricuspid valve disorder, unspecified: Secondary | ICD-10-CM

## 2013-03-05 DIAGNOSIS — G609 Hereditary and idiopathic neuropathy, unspecified: Secondary | ICD-10-CM

## 2013-03-05 DIAGNOSIS — Z1231 Encounter for screening mammogram for malignant neoplasm of breast: Secondary | ICD-10-CM

## 2013-03-05 DIAGNOSIS — I2510 Atherosclerotic heart disease of native coronary artery without angina pectoris: Secondary | ICD-10-CM | POA: Diagnosis present

## 2013-03-05 DIAGNOSIS — N179 Acute kidney failure, unspecified: Secondary | ICD-10-CM | POA: Diagnosis present

## 2013-03-05 DIAGNOSIS — F17200 Nicotine dependence, unspecified, uncomplicated: Secondary | ICD-10-CM | POA: Diagnosis present

## 2013-03-05 DIAGNOSIS — J690 Pneumonitis due to inhalation of food and vomit: Secondary | ICD-10-CM | POA: Diagnosis present

## 2013-03-05 DIAGNOSIS — N289 Disorder of kidney and ureter, unspecified: Secondary | ICD-10-CM

## 2013-03-05 DIAGNOSIS — M549 Dorsalgia, unspecified: Secondary | ICD-10-CM

## 2013-03-05 DIAGNOSIS — K219 Gastro-esophageal reflux disease without esophagitis: Secondary | ICD-10-CM

## 2013-03-05 DIAGNOSIS — D45 Polycythemia vera: Secondary | ICD-10-CM | POA: Diagnosis present

## 2013-03-05 DIAGNOSIS — J9690 Respiratory failure, unspecified, unspecified whether with hypoxia or hypercapnia: Secondary | ICD-10-CM

## 2013-03-05 DIAGNOSIS — F329 Major depressive disorder, single episode, unspecified: Secondary | ICD-10-CM | POA: Diagnosis present

## 2013-03-05 DIAGNOSIS — Z79899 Other long term (current) drug therapy: Secondary | ICD-10-CM

## 2013-03-05 DIAGNOSIS — J4530 Mild persistent asthma, uncomplicated: Secondary | ICD-10-CM

## 2013-03-05 DIAGNOSIS — T40601A Poisoning by unspecified narcotics, accidental (unintentional), initial encounter: Secondary | ICD-10-CM

## 2013-03-05 DIAGNOSIS — G9341 Metabolic encephalopathy: Secondary | ICD-10-CM | POA: Diagnosis present

## 2013-03-05 DIAGNOSIS — G5790 Unspecified mononeuropathy of unspecified lower limb: Secondary | ICD-10-CM | POA: Diagnosis present

## 2013-03-05 DIAGNOSIS — I214 Non-ST elevation (NSTEMI) myocardial infarction: Secondary | ICD-10-CM

## 2013-03-05 DIAGNOSIS — F418 Other specified anxiety disorders: Secondary | ICD-10-CM

## 2013-03-05 DIAGNOSIS — F3289 Other specified depressive episodes: Secondary | ICD-10-CM | POA: Diagnosis present

## 2013-03-05 DIAGNOSIS — N170 Acute kidney failure with tubular necrosis: Secondary | ICD-10-CM | POA: Diagnosis present

## 2013-03-05 DIAGNOSIS — B1920 Unspecified viral hepatitis C without hepatic coma: Secondary | ICD-10-CM | POA: Diagnosis present

## 2013-03-05 LAB — URINALYSIS, ROUTINE W REFLEX MICROSCOPIC
Bilirubin Urine: NEGATIVE
Specific Gravity, Urine: 1.013 (ref 1.005–1.030)
pH: 6 (ref 5.0–8.0)

## 2013-03-05 LAB — GLUCOSE, CAPILLARY
Glucose-Capillary: 134 mg/dL — ABNORMAL HIGH (ref 70–99)
Glucose-Capillary: 154 mg/dL — ABNORMAL HIGH (ref 70–99)
Glucose-Capillary: 137 mg/dL — ABNORMAL HIGH (ref 70–99)

## 2013-03-05 LAB — RAPID URINE DRUG SCREEN, HOSP PERFORMED
Amphetamines: NOT DETECTED
Opiates: NOT DETECTED
Tetrahydrocannabinol: NOT DETECTED

## 2013-03-05 LAB — CBC WITH DIFFERENTIAL/PLATELET
Eosinophils Relative: 0 % (ref 0–5)
HCT: 42.6 % (ref 36.0–46.0)
Lymphocytes Relative: 17 % (ref 12–46)
Lymphs Abs: 2.4 10*3/uL (ref 0.7–4.0)
MCV: 102.2 fL — ABNORMAL HIGH (ref 78.0–100.0)
Monocytes Absolute: 1.8 10*3/uL — ABNORMAL HIGH (ref 0.1–1.0)
RBC: 4.17 MIL/uL (ref 3.87–5.11)
WBC: 14.6 10*3/uL — ABNORMAL HIGH (ref 4.0–10.5)

## 2013-03-05 LAB — MRSA PCR SCREENING: MRSA by PCR: NEGATIVE

## 2013-03-05 LAB — COMPREHENSIVE METABOLIC PANEL
ALT: 25 U/L (ref 0–35)
CO2: 18 mEq/L — ABNORMAL LOW (ref 19–32)
Calcium: 6.7 mg/dL — ABNORMAL LOW (ref 8.4–10.5)
Creatinine, Ser: 2.7 mg/dL — ABNORMAL HIGH (ref 0.50–1.10)
GFR calc Af Amer: 21 mL/min — ABNORMAL LOW (ref >90)
GFR calc non Af Amer: 18 mL/min — ABNORMAL LOW (ref >90)
Glucose, Bld: 141 mg/dL — ABNORMAL HIGH (ref 70–99)
Sodium: 136 mEq/L (ref 135–145)
Total Bilirubin: 0.5 mg/dL (ref 0.3–1.2)

## 2013-03-05 LAB — CARBOXYHEMOGLOBIN
O2 Saturation: 82.5 %
Total hemoglobin: 13.6 g/dL (ref 12.0–16.0)

## 2013-03-05 LAB — BLOOD GAS, ARTERIAL
Bicarbonate: 16.1 mEq/L — ABNORMAL LOW (ref 20.0–24.0)
Delivery systems: POSITIVE
O2 Saturation: 87.2 %
pCO2 arterial: 46.3 mmHg — ABNORMAL HIGH (ref 35.0–45.0)
pH, Arterial: 7.177 — CL (ref 7.350–7.450)
pO2, Arterial: 68.3 mmHg — ABNORMAL LOW (ref 80.0–100.0)

## 2013-03-05 LAB — EXPECTORATED SPUTUM ASSESSMENT W GRAM STAIN, RFLX TO RESP C

## 2013-03-05 LAB — POCT I-STAT, CHEM 8
BUN: 17 mg/dL (ref 6–23)
Calcium, Ion: 0.87 mmol/L — ABNORMAL LOW (ref 1.12–1.23)
Chloride: 109 mEq/L (ref 96–112)
Creatinine, Ser: 2.5 mg/dL — ABNORMAL HIGH (ref 0.50–1.10)
Glucose, Bld: 137 mg/dL — ABNORMAL HIGH (ref 70–99)
Potassium: 4.8 mEq/L (ref 3.5–5.1)

## 2013-03-05 LAB — FIBRINOGEN: Fibrinogen: 269 mg/dL (ref 204–475)

## 2013-03-05 LAB — TROPONIN I: Troponin I: 5.19 ng/mL (ref <0.30)

## 2013-03-05 MED ORDER — ASPIRIN 81 MG PO CHEW
81.0000 mg | CHEWABLE_TABLET | Freq: Every day | ORAL | Status: DC
  Administered 2013-03-05 – 2013-03-07 (×3): 81 mg via ORAL
  Filled 2013-03-05 (×4): qty 1

## 2013-03-05 MED ORDER — SODIUM CHLORIDE 0.9 % IV BOLUS (SEPSIS)
1000.0000 mL | Freq: Once | INTRAVENOUS | Status: AC
  Administered 2013-03-05: 1000 mL via INTRAVENOUS

## 2013-03-05 MED ORDER — PIPERACILLIN-TAZOBACTAM 3.375 G IVPB
3.3750 g | Freq: Once | INTRAVENOUS | Status: AC
  Administered 2013-03-05: 3.375 g via INTRAVENOUS
  Filled 2013-03-05: qty 50

## 2013-03-05 MED ORDER — SODIUM CHLORIDE 0.9 % IV SOLN
INTRAVENOUS | Status: DC
  Administered 2013-03-05 – 2013-03-07 (×5): via INTRAVENOUS

## 2013-03-05 MED ORDER — NALOXONE HCL 1 MG/ML IJ SOLN
1.0000 mg | Freq: Once | INTRAMUSCULAR | Status: AC
  Administered 2013-03-05: 1 mg via INTRAVENOUS
  Filled 2013-03-05: qty 2

## 2013-03-05 MED ORDER — PIPERACILLIN-TAZOBACTAM 3.375 G IVPB
3.3750 g | Freq: Three times a day (TID) | INTRAVENOUS | Status: DC
  Administered 2013-03-05 – 2013-03-08 (×8): 3.375 g via INTRAVENOUS
  Filled 2013-03-05 (×9): qty 50

## 2013-03-05 MED ORDER — VANCOMYCIN HCL IN DEXTROSE 750-5 MG/150ML-% IV SOLN
750.0000 mg | INTRAVENOUS | Status: DC
  Administered 2013-03-05: 750 mg via INTRAVENOUS
  Filled 2013-03-05 (×2): qty 150

## 2013-03-05 MED ORDER — HEPARIN SODIUM (PORCINE) 5000 UNIT/ML IJ SOLN
5000.0000 [IU] | Freq: Three times a day (TID) | INTRAMUSCULAR | Status: DC
  Administered 2013-03-05: 5000 [IU] via SUBCUTANEOUS
  Filled 2013-03-05 (×3): qty 1

## 2013-03-05 MED ORDER — ACETAMINOPHEN 325 MG PO TABS
650.0000 mg | ORAL_TABLET | Freq: Four times a day (QID) | ORAL | Status: DC | PRN
  Administered 2013-03-06 (×2): 650 mg via ORAL
  Filled 2013-03-05 (×2): qty 2

## 2013-03-05 MED ORDER — METHYLPREDNISOLONE SODIUM SUCC 125 MG IJ SOLR
60.0000 mg | Freq: Three times a day (TID) | INTRAMUSCULAR | Status: DC
  Administered 2013-03-05: 60 mg via INTRAVENOUS
  Filled 2013-03-05: qty 0.96
  Filled 2013-03-05: qty 2
  Filled 2013-03-05 (×2): qty 0.96

## 2013-03-05 MED ORDER — VANCOMYCIN HCL IN DEXTROSE 1-5 GM/200ML-% IV SOLN
1000.0000 mg | Freq: Once | INTRAVENOUS | Status: AC
  Administered 2013-03-05: 1000 mg via INTRAVENOUS
  Filled 2013-03-05: qty 200

## 2013-03-05 MED ORDER — SIMVASTATIN 20 MG PO TABS
20.0000 mg | ORAL_TABLET | Freq: Every day | ORAL | Status: DC
  Filled 2013-03-05: qty 1

## 2013-03-05 MED ORDER — BIOTENE DRY MOUTH MT LIQD
15.0000 mL | Freq: Two times a day (BID) | OROMUCOSAL | Status: DC
  Administered 2013-03-05 – 2013-03-08 (×7): 15 mL via OROMUCOSAL

## 2013-03-05 MED ORDER — METHYLPREDNISOLONE SODIUM SUCC 40 MG IJ SOLR
40.0000 mg | Freq: Two times a day (BID) | INTRAMUSCULAR | Status: DC
  Administered 2013-03-05 – 2013-03-07 (×5): 40 mg via INTRAVENOUS
  Filled 2013-03-05 (×6): qty 1

## 2013-03-05 MED ORDER — LEVOTHYROXINE SODIUM 25 MCG PO TABS
25.0000 ug | ORAL_TABLET | Freq: Every day | ORAL | Status: DC
  Administered 2013-03-06 – 2013-03-08 (×3): 25 ug via ORAL
  Filled 2013-03-05 (×4): qty 1

## 2013-03-05 MED ORDER — CLOPIDOGREL BISULFATE 300 MG PO TABS
300.0000 mg | ORAL_TABLET | Freq: Every day | ORAL | Status: AC
  Administered 2013-03-05: 300 mg via ORAL
  Filled 2013-03-05: qty 1

## 2013-03-05 MED ORDER — PANTOPRAZOLE SODIUM 40 MG PO TBEC
40.0000 mg | DELAYED_RELEASE_TABLET | Freq: Every day | ORAL | Status: DC
  Administered 2013-03-05 – 2013-03-07 (×3): 40 mg via ORAL
  Filled 2013-03-05 (×4): qty 1

## 2013-03-05 MED ORDER — ALBUTEROL SULFATE (5 MG/ML) 0.5% IN NEBU: 2.5000 mg | INHALATION_SOLUTION | RESPIRATORY_TRACT | Status: DC | PRN

## 2013-03-05 MED ORDER — HEPARIN (PORCINE) IN NACL 100-0.45 UNIT/ML-% IJ SOLN
850.0000 [IU]/h | INTRAMUSCULAR | Status: DC
  Administered 2013-03-05: 850 [IU]/h via INTRAVENOUS
  Filled 2013-03-05: qty 250

## 2013-03-05 MED ORDER — CLOPIDOGREL BISULFATE 75 MG PO TABS
75.0000 mg | ORAL_TABLET | Freq: Every day | ORAL | Status: DC
  Administered 2013-03-06 – 2013-03-08 (×3): 75 mg via ORAL
  Filled 2013-03-05 (×4): qty 1

## 2013-03-05 MED ORDER — FENTANYL CITRATE 0.05 MG/ML IJ SOLN
50.0000 ug | Freq: Once | INTRAMUSCULAR | Status: AC
  Administered 2013-03-05: 50 ug via INTRAVENOUS
  Filled 2013-03-05: qty 2

## 2013-03-05 MED ORDER — INSULIN ASPART 100 UNIT/ML ~~LOC~~ SOLN
0.0000 [IU] | SUBCUTANEOUS | Status: DC
  Administered 2013-03-05: 2 [IU] via SUBCUTANEOUS
  Administered 2013-03-05: 3 [IU] via SUBCUTANEOUS
  Administered 2013-03-05: 2 [IU] via SUBCUTANEOUS

## 2013-03-05 MED ORDER — SODIUM CHLORIDE 0.9 % IV BOLUS (SEPSIS): 1000.0000 mL | INTRAVENOUS | Status: DC | PRN

## 2013-03-05 MED ORDER — ATORVASTATIN CALCIUM 80 MG PO TABS
80.0000 mg | ORAL_TABLET | Freq: Every day | ORAL | Status: DC
  Administered 2013-03-05 – 2013-03-07 (×3): 80 mg via ORAL
  Filled 2013-03-05 (×4): qty 1

## 2013-03-05 NOTE — ED Notes (Signed)
RT paged for pt.

## 2013-03-05 NOTE — ED Provider Notes (Signed)
History     CSN: [id number]  Arrival date & time 03/05/13  0423   First MD Initiated Contact with Patient 03/05/13 [phone]     Level V caveat due to altered mental status Chief Complaint  Patient presents with  . Drug Overdose    (Consider location/radiation/quality/duration/timing/severity/associated sxs/prior treatment) Patient is a 57 y.o. female presenting with Overdose. The history is provided by the spouse, the EMS personnel and the patient.  Drug Overdose   patient was brought in for altered mental status. Reportedly the day before yesterday took an 80 mg tab of Opana it was a relative's. She became confused and was found passed out in her car at [employer]. She refused transfer by EMS and went home. She then stayed in bed for a day and was normal she spots in by her husband today. She had some improvement by EMS administration of Narcan. She will arouse somewhat to stimulation here but is mildly confused  Past Medical History  Diagnosis Date  . Eczema   . ASTHMA 06/06/2009  . BACK PAIN 08/06/2010  . DEPRESSION 06/06/2009  . Edema 05/20/2010  . GERD 06/06/2009  . HEPATITIS C WITHOUT HEPATIC COMA 06/06/2009  . HYPERTENSION 06/06/2009  . OSTEOPOROSIS 06/06/2009  . 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 TOBACCO USE 06/05/2010  . Unspecified hypothyroidism 06/05/2010  . VITAMIN D DEFICIENCY 06/06/2009  . Chronic pancreatitis     Past Surgical History  Procedure Laterality Date  . Cholecystectomy    . Breast lumpectomy      benign, right  . Tubal ligation    . Tonsillectomy    . Lumbar laminectomy      Family History  Problem Relation Age of Onset  . COPD Father   . Hypertension Mother   . Colon cancer Neg Hx   . Stomach cancer Neg Hx     History  Substance Use Topics  . Smoking status: Current Every Day Smoker -- 1.00 packs/day for 35 years    Types: Cigarettes  . Smokeless tobacco: Never Used     Comment: Currently only smokes with stress.  . Alcohol  Use: No    OB History   Grav Para Term Preterm Abortions TAB SAB Ect Mult Living                  Review of Systems  Unable to perform ROS   Allergies  Meperidine hcl and Naproxen  Home Medications   Current Outpatient Rx  Name  Route  Sig  Dispense  Refill  . ALPRAZolam (XANAX) 0.5 MG tablet   Oral   Take 0.5 mg by mouth 3 (three) times daily as needed for anxiety.         . gabapentin (NEURONTIN) 300 MG capsule   Oral   Take 1 capsule (300 mg total) by mouth 3 (three) times daily.   90 capsule   3   . HYDROcodone-acetaminophen (NORCO) 10-325 MG per tablet   Oral   Take 1 tablet by mouth every 6 (six) hours as needed for pain.          [name] levothyroxine (SYNTHROID) 25 MCG tablet   Oral   Take 25 mcg by mouth daily.             BP 112/61  Pulse 108  Temp(Src) 101 F (38.3 C) (Rectal)  Resp 29  SpO2 93%  LMP 10/05/1999  Physical Exam  Constitutional: She appears well-developed and well-nourished.  HENT:  Head: Normocephalic.  Neck: Normal range of motion.  Cardiovascular:  Mild tachycardia.  Pulmonary/Chest:  Harsh breath sounds throughout. Tachypnea  Abdominal: Soft. There is no tenderness.  Musculoskeletal: She exhibits no tenderness.  Neurological:  Decreased mental status. Will improve somewhat with stimulation. Poorly handling secretions.  Skin: No rash noted. No erythema.    ED Course  Procedures (including critical care time)  Labs Reviewed  CBC WITH DIFFERENTIAL - Abnormal; Notable for the following:    WBC 14.6 (*)    MCV 102.2 (*)    Platelets 134 (*)    Neutro Abs 10.4 (*)    Monocytes Absolute 1.8 (*)    All other components within normal limits  COMPREHENSIVE METABOLIC PANEL - Abnormal; Notable for the following:    CO2 18 (*)    Glucose, Bld 141 (*)    Creatinine, Ser 2.70 (*)    Calcium 6.7 (*)    Albumin 2.4 (*)    AST 74 (*)    GFR calc non Af Amer 18 (*)    GFR calc Af Amer 21 (*)    All other components within  normal limits  BLOOD GAS, ARTERIAL - Abnormal; Notable for the following:    pH, Arterial 7.177 (*)    pCO2 arterial 46.3 (*)    pO2, Arterial 68.3 (*)    Bicarbonate 16.1 (*)    Acid-base deficit 11.5 (*)    All other components within normal limits  GLUCOSE, CAPILLARY - Abnormal; Notable for the following:    Glucose-Capillary 134 (*)    All other components within normal limits  SALICYLATE LEVEL - Abnormal; Notable for the following:    Salicylate Lvl <8.1 (*)    All other components within normal limits  CG4 I-STAT (LACTIC ACID) - Abnormal; Notable for the following:    Lactic Acid, Venous 5.24 (*)    All other components within normal limits  POCT I-STAT, CHEM 8 - Abnormal; Notable for the following:    Creatinine, Ser 2.50 (*)    Glucose, Bld 137 (*)    Calcium, Ion 0.87 (*)    Hemoglobin 16.0 (*)    HCT 47.0 (*)    All other components within normal limits  URINE CULTURE  CULTURE, BLOOD (ROUTINE X 2)  CULTURE, BLOOD (ROUTINE X 2)  CULTURE, BLOOD (ROUTINE X 2)  CULTURE, BLOOD (ROUTINE X 2)  URINE CULTURE  CULTURE, EXPECTORATED SPUTUM-ASSESSMENT  ACETAMINOPHEN LEVEL  URINALYSIS, ROUTINE W REFLEX MICROSCOPIC  URINE RAPID DRUG SCREEN (HOSP PERFORMED)  ETHANOL  CBC  COMPREHENSIVE METABOLIC PANEL  LACTIC ACID, PLASMA  URINALYSIS, ROUTINE W REFLEX MICROSCOPIC  CORTISOL  PROCALCITONIN  TROPONIN I  PROTIME-INR  APTT  FIBRINOGEN  URINE RAPID DRUG SCREEN (HOSP PERFORMED)  TYPE AND SCREEN   Dg Chest Port 1 View  03/05/2013   *RADIOLOGY REPORT*  Clinical Data: Altered mental status; fever.  Apparent overdose.  PORTABLE CHEST - 1 VIEW  Comparison: Chest radiograph performed 03/20/2012  Findings: The lungs are well-aerated.  Mild bibasilar airspace opacities may reflect mild pneumonia or possibly mildly asymmetric interstitial edema.  Mild vascular congestion is noted.  No definite pleural effusion or pneumothorax is seen.  The cardiomediastinal silhouette is borderline normal  in size; calcification is noted within the aortic arch.  No acute osseous abnormalities are seen.  IMPRESSION: Mild bibasilar airspace opacities may reflect mild pneumonia or possibly mildly asymmetric interstitial edema, right greater than left.  Mild vascular congestion noted.   Original Report Authenticated By: Santa Lighter, M.D.  1. Sepsis   2. Aspiration pneumonia   3. Renal insufficiency   4. Respiratory failure    CRITICAL CARE Performed by: Mackie Pai Total critical care time: 30 Critical care time was exclusive of separately billable procedures and treating other patients. Critical care was necessary to treat or prevent imminent or life-threatening deterioration. Critical care was time spent personally by me on the following activities: development of treatment plan with patient and/or surrogate as well as nursing, discussions with consultants, evaluation of patient's response to treatment, examination of patient, obtaining history from patient or surrogate, ordering and performing treatments and interventions, ordering and review of laboratory studies, ordering and review of radiographic studies, pulse oximetry and re-evaluation of patient's condition.   MDM  Patient presents with altered mental status. X-ray shows possible pneumonia bilaterally. Likely aspiration due to patient's decreased mental status. Patient is febrile. She has decreased mental status and was poorly handling secretions. Mental status improved slightly with Narcan. It improved with time and fluids here. Had some hypotension that also improved. She is respiratory and metabolic acidosis. Poorly tolerated BiPAP. ABG showed some hypoxia. Patient was started on Zosyn and vancomycin. She'll be admitted to the ICU. She was difficult for IV access. Difficulty getting blood lead to the delay in the return of the labs.        Jasper Riling. Alvino Chapel, MD 03/05/13 [phone]

## 2013-03-05 NOTE — Plan of Care (Signed)
Problem: Consults Goal: General Medical Patient Education See Patient Education Module for specific education. Outcome: Progressing All care explained to pt and husband.  Husband receptive to updates and education, pt. Is lethargic and will need follow up education when more awake.  Problem: Phase I Progression Outcomes Goal: Pain controlled with appropriate interventions Outcome: Progressing Husband states that pt has chronic lower back pain but is no longer prescribed pain medicine by MD.  Pt states that her pain bothered her enough to take medicine that was not prescribed to her. Goal: OOB as tolerated unless otherwise ordered Outcome: Not Progressing Too lethargic at this time. Goal: Voiding-avoid urinary catheter unless indicated Outcome: Progressing Pt goes long period of time between void times.

## 2013-03-05 NOTE — Procedures (Signed)
Central Venous Catheter Insertion Procedure Note SHATISHA FALTER [id number] 03/04/1956  Procedure: Insertion of Central Venous Catheter Indications: Assessment of intravascular volume, Drug and/or fluid administration and Frequent blood sampling  Procedure Details Consent: Risks of procedure as well as the alternatives and risks of each were explained to the (patient/caregiver).  Consent for procedure obtained. Time Out: Verified patient identification, verified procedure, site/side was marked, verified correct patient position, special equipment/implants available, medications/allergies/relevent history reviewed, required imaging and test results available.  Performed  Maximum sterile technique was used including antiseptics, cap, gloves, gown, hand hygiene, mask and sheet. Skin prep: Chlorhexidine; local anesthetic administered A antimicrobial bonded/coated triple lumen catheter was placed in the left internal jugular vein using the Seldinger technique. Ultrasound guidance used.yes Catheter placed to 20 cm. Blood aspirated via all 3 ports and then flushed x 3. Line sutured x 2 and dressing applied.  Evaluation Blood flow good Complications: No apparent complications Patient did tolerate procedure well. Chest X-ray ordered to verify placement.  CXR: CVL in good position.  Richardson Landry Minor ACNP Maryanna Shape PCCM Pager [phone] till 3 pm If no answer page [phone] 03/05/2013, 6:48 AM   Chesley Mires, MD [place] 03/05/2013, 9:18 AM Pager:  [phone] After 3pm call: [phone]

## 2013-03-05 NOTE — Progress Notes (Signed)
RN calling     Recent Labs Lab 03/05/13 [phone] 03/05/13 1315  TROPONINI 2.32* 5.19*   Spoke to NP who admitted patient. No obvious bleeding. She appears to have risk factors   Recent Labs Lab 03/05/13 0712  INR 1.44    EKG shows lateral T wave investions   has a past medical history of Eczema; ASTHMA (06/06/2009); BACK PAIN (08/06/2010); DEPRESSION (06/06/2009); Edema (05/20/2010); GERD (06/06/2009); HEPATITIS C WITHOUT HEPATIC COMA (06/06/2009); HYPERTENSION (06/06/2009); OSTEOPOROSIS (06/06/2009); PERIPHERAL NEUROPATHY, LOWER EXTREMITIES, BILATERAL (06/06/2009); TOBACCO USE (06/05/2010); Unspecified hypothyroidism (06/05/2010); VITAMIN D DEFICIENCY (06/06/2009); and Chronic pancreatitis.   reports that she has been smoking Cigarettes.  She has a 35 pack-year smoking history. She has never used smokeless tobacco.   PLAN Repeat EKG cpontinue aspirin Start IV heparin Await echo Call cardiology   Dr. Brand Males, M.D., [facility].C.P Pulmonary and Critical Care Medicine Staff Physician [facility] Pager: [phone], If no answer or between  15:00h - 7:00h: call [phone]  03/05/2013 4:05 PM

## 2013-03-05 NOTE — Progress Notes (Signed)
CARE MANAGEMENT NOTE 03/05/2013  Patient:  Phyllis [name],Phyllis [name]   Account Number:  [id number]  Date Initiated:  03/05/2013  Documentation initiated by:  Shauniece Kwan  Subjective/Objective Assessment:   overdose of pain meds patient denies intentional     Action/Plan:   home   Anticipated DC Date:  03/08/2013   Anticipated DC Plan:  HOME/SELF CARE  In-house referral  Clinical Social Worker      DC Planning Services  NA      [facility] Choice  NA   Choice offered to / List presented to:  NA   DME arranged  NA      DME agency  NA     [place] arranged  NA      [place] agency  NA   Status of service:  In process, will continue to follow Medicare Important Message given?  NA - LOS <3 / Initial given by admissions (If response is "NO", the following Medicare IM given date fields will be blank) Date Medicare IM given:   Date Additional Medicare IM given:    Discharge Disposition:    Per UR Regulation:  Reviewed for med. necessity/level of care/duration of stay  If discussed at [place] of Stay Meetings, dates discussed:    Comments:  [id number]/Taeler Winning Rosana Hoes, RN, BSN, CCM:  CHART REVIEWED AND UPDATED.  Next chart review due on [id number]. NO DISCHARGE NEEDS PRESENT AT THIS TIME. CASE MANAGEMENT [phone]

## 2013-03-05 NOTE — Consult Note (Signed)
Reason for Consult: Elevated cardiac enzymes and EKG changes Referring Physician: CCM  Norena A Minervini is an 57 y.o. female.  HPI: Patient is 57 year old female with past rectal history significant for hypertension, depression, GERD, history of hepatitis, degenerative joint disease, history of hepatitis, tobacco abuse, chronic pain syndrome, was admitted today because of unresponsiveness following opiate overdose. Patient complaints of chronic leg pain states took opama and became unresponsive and was brought to ED by her husband. Cardiologic consultation is obtained as patient was noted to have ST-T wave changes in anterolateral and inferior leads and was noted to have elevated troponin I. Patient presently denies any chest pain nausea or vomiting diaphoresis. Denies any history of exertional chest pain. Denies any palpitation lightheadedness or syncopal episode prior to this. Denies history of PND orthopnea leg swelling. Past Medical History  Diagnosis Date  . Eczema   . ASTHMA 06/06/2009  . BACK PAIN 08/06/2010  . DEPRESSION 06/06/2009  . Edema 05/20/2010  . GERD 06/06/2009  . HEPATITIS C WITHOUT HEPATIC COMA 06/06/2009  . HYPERTENSION 06/06/2009  . OSTEOPOROSIS 06/06/2009  . 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 TOBACCO USE 06/05/2010  . Unspecified hypothyroidism 06/05/2010  . VITAMIN D DEFICIENCY 06/06/2009  . Chronic pancreatitis     Past Surgical History  Procedure Laterality Date  . Cholecystectomy    . Breast lumpectomy      benign, right  . Tubal ligation    . Tonsillectomy    . Lumbar laminectomy      Family History  Problem Relation Age of Onset  . COPD Father   . Hypertension Mother   . Colon cancer Neg Hx   . Stomach cancer Neg Hx     Social History:  reports that she has been smoking Cigarettes.  She has a 35 pack-year smoking history. She has never used smokeless tobacco. She reports that she does not drink alcohol or use illicit drugs.  Allergies:   Allergies  Allergen Reactions  . Meperidine Hcl     hallucinations  . Naproxen     Extreme bruising    Medications: I have reviewed the patient's current medications.  Results for orders placed during the hospital encounter of 03/05/13 (from the past 48 hour(s))  BLOOD GAS, ARTERIAL     Status: Abnormal   Collection Time    03/05/13  4:28 AM      Result Value Range   O2 Content 4.0     Delivery systems CONTINUOUS POSITIVE AIRWAY PRESSURE     pH, Arterial 7.177 (*) 7.350 - 7.450   Comment: CRITICAL RESULT CALLED TO, READ BACK BY AND VERIFIED WITH:     NATHAN PICKERING,MD AT 0449 BY AMY RAY,RRT,RCP ON 03/05/13   pCO2 arterial 46.3 (*) 35.0 - 45.0 mmHg   pO2, Arterial 68.3 (*) 80.0 - 100.0 mmHg   Bicarbonate 16.1 (*) 20.0 - 24.0 mEq/L   TCO2 14.9  0 - 100 mmol/L   Acid-base deficit 11.5 (*) 0.0 - 2.0 mmol/L   O2 Saturation 87.2     Patient temperature 101.5     Collection site RIGHT RADIAL     Drawn by [phone]     Sample type ARTERIAL DRAW     Allens test (pass/fail) PASS  PASS  GLUCOSE, CAPILLARY     Status: Abnormal   Collection Time    03/05/13  4:31 AM      Result Value Range   Glucose-Capillary 134 (*) 70 - 99 mg/dL   Comment 1  Documented in Chart     Comment 2 Notify RN    CBC WITH DIFFERENTIAL     Status: Abnormal   Collection Time    03/05/13  5:55 AM      Result Value Range   WBC 14.6 (*) 4.0 - 10.5 K/uL   RBC 4.17  3.87 - 5.11 MIL/uL   Hemoglobin 13.2  12.0 - 15.0 g/dL   HCT 42.6  36.0 - 46.0 %   MCV 102.2 (*) 78.0 - 100.0 fL   MCH 31.7  26.0 - 34.0 pg   MCHC 31.0  30.0 - 36.0 g/dL   RDW 13.1  11.5 - 15.5 %   Platelets 134 (*) 150 - 400 K/uL   Neutrophils Relative % 71  43 - 77 %   Neutro Abs 10.4 (*) 1.7 - 7.7 K/uL   Lymphocytes Relative 17  12 - 46 %   Lymphs Abs 2.4  0.7 - 4.0 K/uL   Monocytes Relative 12  3 - 12 %   Monocytes Absolute 1.8 (*) 0.1 - 1.0 K/uL   Eosinophils Relative 0  0 - 5 %   Eosinophils Absolute 0.0  0.0 - 0.7 K/uL   Basophils  Relative 0  0 - 1 %   Basophils Absolute 0.0  0.0 - 0.1 K/uL  COMPREHENSIVE METABOLIC PANEL     Status: Abnormal   Collection Time    03/05/13  5:55 AM      Result Value Range   Sodium 136  135 - 145 mEq/L   Potassium 4.9  3.5 - 5.1 mEq/L   Chloride 103  96 - 112 mEq/L   CO2 18 (*) 19 - 32 mEq/L   Glucose, Bld 141 (*) 70 - 99 mg/dL   BUN 15  6 - 23 mg/dL   Creatinine, Ser 2.70 (*) 0.50 - 1.10 mg/dL   Calcium 6.7 (*) 8.4 - 10.5 mg/dL   Total Protein 6.4  6.0 - 8.3 g/dL   Albumin 2.4 (*) 3.5 - 5.2 g/dL   AST 74 (*) 0 - 37 U/L   ALT 25  0 - 35 U/L   Alkaline Phosphatase 94  39 - 117 U/L   Total Bilirubin 0.5  0.3 - 1.2 mg/dL   GFR calc non Af Amer 18 (*) >90 mL/min   GFR calc Af Amer 21 (*) >90 mL/min   Comment:            The eGFR has been calculated     using the CKD EPI equation.     This calculation has not been     validated in all clinical     situations.     eGFR's persistently     <90 mL/min signify     possible Chronic Kidney Disease.  ACETAMINOPHEN LEVEL     Status: None   Collection Time    03/05/13  5:55 AM      Result Value Range   Acetaminophen (Tylenol), Serum <15.0  10 - 30 ug/mL   Comment:            THERAPEUTIC CONCENTRATIONS VARY     SIGNIFICANTLY. A RANGE OF 10-30     ug/mL MAY BE AN EFFECTIVE     CONCENTRATION FOR MANY PATIENTS.     HOWEVER, SOME ARE BEST TREATED     AT CONCENTRATIONS OUTSIDE THIS     RANGE.     ACETAMINOPHEN CONCENTRATIONS     >150 ug/mL AT 4 HOURS AFTER  INGESTION AND >50 ug/mL AT 12     HOURS AFTER INGESTION ARE     OFTEN ASSOCIATED WITH TOXIC     REACTIONS.  SALICYLATE LEVEL     Status: Abnormal   Collection Time    03/05/13  5:55 AM      Result Value Range   Salicylate Lvl <7.8 (*) 2.8 - 20.0 mg/dL  ETHANOL     Status: None   Collection Time    03/05/13  5:55 AM      Result Value Range   Alcohol, Ethyl (B) <11  0 - 11 mg/dL   Comment:            LOWEST DETECTABLE LIMIT FOR     SERUM ALCOHOL IS 11 mg/dL     FOR  MEDICAL PURPOSES ONLY  CG4 I-STAT (LACTIC ACID)     Status: Abnormal   Collection Time    03/05/13  6:11 AM      Result Value Range   Lactic Acid, Venous 5.24 (*) 0.5 - 2.2 mmol/L  POCT I-STAT, CHEM 8     Status: Abnormal   Collection Time    03/05/13  6:11 AM      Result Value Range   Sodium 139  135 - 145 mEq/L   Potassium 4.8  3.5 - 5.1 mEq/L   Chloride 109  96 - 112 mEq/L   BUN 17  6 - 23 mg/dL   Creatinine, Ser 2.50 (*) 0.50 - 1.10 mg/dL   Glucose, Bld 137 (*) 70 - 99 mg/dL   Calcium, Ion 0.87 (*) 1.12 - 1.23 mmol/L   TCO2 19  0 - 100 mmol/L   Hemoglobin 16.0 (*) 12.0 - 15.0 g/dL   HCT 47.0 (*) 36.0 - 46.0 %  PROCALCITONIN     Status: None   Collection Time    03/05/13  6:45 AM      Result Value Range   Procalcitonin 0.45     Comment:            Interpretation:     PCT (Procalcitonin) <= 0.5 ng/mL:     Systemic infection (sepsis) is not likely.     Local bacterial infection is possible.     (NOTE)             ICU PCT Algorithm               Non ICU PCT Algorithm        ----------------------------     ------------------------------             PCT < 0.25 ng/mL                 PCT < 0.1 ng/mL         Stopping of antibiotics            Stopping of antibiotics           strongly encouraged.               strongly encouraged.        ----------------------------     ------------------------------           PCT level decrease by               PCT < 0.25 ng/mL           >= 80% from peak PCT           OR PCT 0.25 - 0.5 ng/mL  Stopping of antibiotics                                                 encouraged.         Stopping of antibiotics               encouraged.        ----------------------------     ------------------------------           PCT level decrease by              PCT >= 0.25 ng/mL           < 80% from peak PCT            AND PCT >= 0.5 ng/mL            Continuing antibiotics                                                  encouraged.            Continuing antibiotics                encouraged.        ----------------------------     ------------------------------         PCT level increase compared          PCT > 0.5 ng/mL             with peak PCT AND              PCT >= 0.5 ng/mL             Escalation of antibiotics                                              strongly encouraged.          Escalation of antibiotics            strongly encouraged.  LACTIC ACID, PLASMA     Status: Abnormal   Collection Time    03/05/13  7:12 AM      Result Value Range   Lactic Acid, Venous 3.2 (*) 0.5 - 2.2 mmol/L  CORTISOL     Status: None   Collection Time    03/05/13  7:12 AM      Result Value Range   Cortisol, Plasma 43.3     Comment: (NOTE)     AM:  4.3 - 22.4 ug/dL     PM:  3.1 - 16.7 ug/dL  TROPONIN I     Status: Abnormal   Collection Time    03/05/13  7:12 AM      Result Value Range   Troponin I 2.32 (*) <0.30 ng/mL   Comment:            Due to the release kinetics of cTnI,     a negative result within the first hours     of the onset of symptoms does not rule out     myocardial infarction with certainty.     If myocardial infarction is still suspected,  repeat the test at appropriate intervals.     CRITICAL RESULT CALLED TO, READ BACK BY AND VERIFIED WITH:     LITTMAN,K. RN AT 2778 03/05/13 BARFIELD,T  PROTIME-INR     Status: Abnormal   Collection Time    03/05/13  7:12 AM      Result Value Range   Prothrombin Time 17.2 (*) 11.6 - 15.2 seconds   INR 1.44  0.00 - 1.49  APTT     Status: None   Collection Time    03/05/13  7:12 AM      Result Value Range   aPTT 32  24 - 37 seconds  FIBRINOGEN     Status: None   Collection Time    03/05/13  7:12 AM      Result Value Range   Fibrinogen 269  204 - 475 mg/dL  TYPE AND SCREEN     Status: None   Collection Time    03/05/13  7:12 AM      Result Value Range   ABO/RH(D) B POS     Antibody Screen NEG     Sample Expiration 03/08/2013    ABO/RH     Status: None    Collection Time    03/05/13  7:12 AM      Result Value Range   ABO/RH(D) B POS    URINALYSIS, ROUTINE W REFLEX MICROSCOPIC     Status: Abnormal   Collection Time    03/05/13  7:57 AM      Result Value Range   Color, Urine AMBER (*) YELLOW   Comment: BIOCHEMICALS MAY BE AFFECTED BY COLOR   APPearance CLOUDY (*) CLEAR   Specific Gravity, Urine 1.013  1.005 - 1.030   pH 6.0  5.0 - 8.0   Glucose, UA NEGATIVE  NEGATIVE mg/dL   Hgb urine dipstick LARGE (*) NEGATIVE   Bilirubin Urine NEGATIVE  NEGATIVE   Ketones, ur NEGATIVE  NEGATIVE mg/dL   Protein, ur 30 (*) NEGATIVE mg/dL   Urobilinogen, UA 2.0 (*) 0.0 - 1.0 mg/dL   Nitrite NEGATIVE  NEGATIVE   Leukocytes, UA NEGATIVE  NEGATIVE  URINE RAPID DRUG SCREEN (HOSP PERFORMED)     Status: Abnormal   Collection Time    03/05/13  7:57 AM      Result Value Range   Opiates NONE DETECTED  NONE DETECTED   Cocaine NONE DETECTED  NONE DETECTED   Benzodiazepines POSITIVE (*) NONE DETECTED   Amphetamines NONE DETECTED  NONE DETECTED   Tetrahydrocannabinol NONE DETECTED  NONE DETECTED   Barbiturates NONE DETECTED  NONE DETECTED   Comment:            DRUG SCREEN FOR MEDICAL PURPOSES     ONLY.  IF CONFIRMATION IS NEEDED     FOR ANY PURPOSE, NOTIFY LAB     WITHIN 5 DAYS.                LOWEST DETECTABLE LIMITS     FOR URINE DRUG SCREEN     Drug Class       Cutoff (ng/mL)     Amphetamine      1000     Barbiturate      200     Benzodiazepine   242     Tricyclics       353     Opiates          300     Cocaine  300     THC              50  URINE MICROSCOPIC-ADD ON     Status: Abnormal   Collection Time    03/05/13  7:57 AM      Result Value Range   Squamous Epithelial / LPF RARE  RARE   WBC, UA 0-2  <3 WBC/hpf   RBC / HPF 3-6  <3 RBC/hpf   Bacteria, UA FEW (*) RARE  MRSA PCR SCREENING     Status: None   Collection Time    03/05/13  8:40 AM      Result Value Range   MRSA by PCR NEGATIVE  NEGATIVE   Comment:            The  GeneXpert MRSA Assay (FDA     approved for NASAL specimens     only), is one component of a     comprehensive MRSA colonization     surveillance program. It is not     intended to diagnose MRSA     infection nor to guide or     monitor treatment for     MRSA infections.  CARBOXYHEMOGLOBIN     Status: None   Collection Time    03/05/13  9:48 AM      Result Value Range   Total hemoglobin 13.6  12.0 - 16.0 g/dL   O2 Saturation 82.5     Carboxyhemoglobin 1.0  0.5 - 1.5 %   Methemoglobin 1.3  0.0 - 1.5 %  GLUCOSE, CAPILLARY     Status: Abnormal   Collection Time    03/05/13 11:42 AM      Result Value Range   Glucose-Capillary 154 (*) 70 - 99 mg/dL  CULTURE, EXPECTORATED SPUTUM-ASSESSMENT     Status: None   Collection Time    03/05/13  1:05 PM      Result Value Range   Specimen Description SPUTUM     Special Requests NONE     Sputum evaluation       Value: THIS SPECIMEN IS ACCEPTABLE. RESPIRATORY CULTURE REPORT TO FOLLOW.   Report Status 03/05/2013 FINAL    TROPONIN I     Status: Abnormal   Collection Time    03/05/13  1:15 PM      Result Value Range   Troponin I 5.19 (*) <0.30 ng/mL   Comment:            Due to the release kinetics of cTnI,     a negative result within the first hours     of the onset of symptoms does not rule out     myocardial infarction with certainty.     If myocardial infarction is still suspected,     repeat the test at appropriate intervals.     CRITICAL RESULT CALLED TO, READ BACK BY AND VERIFIED WITH:     MAIN,S.RN AT 1501 03/05/13 BY WELLS,D.    Dg Chest Port 1 View  03/05/2013   *RADIOLOGY REPORT*  Clinical Data: Line placement.  PORTABLE CHEST - 1 VIEW  Comparison: Chest 03/05/2013 at two 02/05/1979.  Findings: The patient has a new left IJ approach central venous catheter with the tip projecting over the mid to lower superior vena cava.  No pneumothorax is identified.  There has been marked worsening of aeration in the right lung base. There is  interstitial edema.  IMPRESSION:  1.  Right IJ catheter tip projects over the superior vena cava.  No pneumothorax. 2.  Marked worsening in right basilar aeration could be due to aspiration or atelectasis. 3.  Interstitial edema.   Original Report Authenticated By: Orlean Patten, M.D.   Dg Chest Port 1 View  03/05/2013   *RADIOLOGY REPORT*  Clinical Data: Altered mental status; fever.  Apparent overdose.  PORTABLE CHEST - 1 VIEW  Comparison: Chest radiograph performed 03/20/2012  Findings: The lungs are well-aerated.  Mild bibasilar airspace opacities may reflect mild pneumonia or possibly mildly asymmetric interstitial edema.  Mild vascular congestion is noted.  No definite pleural effusion or pneumothorax is seen.  The cardiomediastinal silhouette is borderline normal in size; calcification is noted within the aortic arch.  No acute osseous abnormalities are seen.  IMPRESSION: Mild bibasilar airspace opacities may reflect mild pneumonia or possibly mildly asymmetric interstitial edema, right greater than left.  Mild vascular congestion noted.   Original Report Authenticated By: Santa Lighter, M.D.    Review of Systems  Constitutional: Negative for fever and chills.  Eyes: Negative for blurred vision and double vision.  Gastrointestinal: Negative for nausea, vomiting and abdominal pain.  Neurological: Negative for dizziness and headaches.   Blood pressure 123/59, pulse 88, temperature 99.2 F (37.3 C), temperature source Axillary, resp. rate 16, height _0  (1.651 m), weight 72.7 kg (160 lb 4.4 oz), last menstrual period 10/05/1999, SpO2 96.00%. Physical Exam  Constitutional: She is oriented to person, place, and time.  HENT:  Head: Normocephalic and atraumatic.  Eyes: Conjunctivae are normal. Pupils are equal, round, and reactive to light. Left eye exhibits no discharge.  Neck: Normal range of motion. Neck supple. No JVD present. No tracheal deviation present. No thyromegaly present.   Cardiovascular: Normal rate and regular rhythm.   Murmur (soft systolic murmur noted left lower sternal border no S3 gallop) heard. Respiratory:  Decrease vessel at bases with bilateral rhonchi  GI: Soft. Bowel sounds are normal. She exhibits no distension. There is no tenderness. There is no rebound and no guarding.  Musculoskeletal: She exhibits no edema and no tenderness.  Neurological: She is alert and oriented to person, place, and time.    Assessment/Plan: Acute non-Q-wave myocardial infarction Status post acute hypercarbic respiratory failure Aspiration pneumonia Acute renal failure Hypertension Depression Tobacco abuse History of bronchial asthma Chronic pain syndrome Status post opiate overdose History of hepatitis C History of pancreatitis in the past  GERD Depression Plan Check serial enzymes and EKG Agree with IV heparin Will add Plavix as per orders Check lipid panel Add statin Will schedule for left cath possible PTCA stenting and next 2-3 days once stable from pulmonary standpoint had also from renal perspective.  Lance Galas N 03/05/2013, 4:45 PM

## 2013-03-05 NOTE — Progress Notes (Signed)
[facility] Progress Note Patient Name: Phyllis [name] DOB: 1955-10-10 MRN: [id number]  Date of Service  03/05/2013   HPI/Events of Note  Call from nurse reporting that patient has chronic back pain and now c/o of severe back pain.  Has no pain medications ordered as admitted obtunded secondary to opiate OD.  Currently hypertensive with HR of 89 and sats of 96%.   eICU Interventions  Plan: Order for tylenol 650 mg po q6 hours prn pain One time order of fentanyl 50 mcg IV    Intervention Category Intermediate Interventions: Pain - evaluation and management  Bellarae Lizer 03/05/2013, 11:45 PM

## 2013-03-05 NOTE — Progress Notes (Signed)
Echocardiogram 2D Echocardiogram has been performed.  Phyllis [name] 03/05/2013, 3:53 PM

## 2013-03-05 NOTE — Progress Notes (Signed)
ANTICOAGULATION CONSULT NOTE - Initial Consult  Pharmacy Consult for Heparin Indication: ACS/STEMI  Allergies  Allergen Reactions  . Meperidine Hcl     hallucinations  . Naproxen     Extreme bruising    Patient Measurements: Height: _0  (165.1 cm) Weight: 160 lb 4.4 oz (72.7 kg) IBW/kg (Calculated) : 57 Heparin Dosing Weight: 72kg  Vital Signs: Temp: 99.2 F (37.3 C) (06/02 1200) Temp src: Axillary (06/02 1200) BP: 127/58 mmHg (06/02 1500) Pulse Rate: 91 (06/02 1430)  Labs:  Recent Labs  03/05/13 0555 03/05/13 0611 03/05/13 0712 03/05/13 1315  HGB 13.2 16.0*  --   --   HCT 42.6 47.0*  --   --   PLT 134*  --   --   --   APTT  --   --  32  --   LABPROT  --   --  17.2*  --   INR  --   --  1.44  --   CREATININE 2.70* 2.50*  --   --   TROPONINI  --   --  2.32* 5.19*    Estimated Creatinine Clearance: 24.5 ml/min (by C-G formula based on Cr of 2.5).   Medical History: Past Medical History  Diagnosis Date  . Eczema   . ASTHMA 06/06/2009  . BACK PAIN 08/06/2010  . DEPRESSION 06/06/2009  . Edema 05/20/2010  . GERD 06/06/2009  . HEPATITIS C WITHOUT HEPATIC COMA 06/06/2009  . HYPERTENSION 06/06/2009  . OSTEOPOROSIS 06/06/2009  . 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 TOBACCO USE 06/05/2010  . Unspecified hypothyroidism 06/05/2010  . VITAMIN D DEFICIENCY 06/06/2009  . Chronic pancreatitis     Assessment: 68 yoF admitted with intentional drug overdose and AMS, started on IV abx for early sepsis/? Aspiration PNA .  Pt found to have elevated troponins and EKG with T-wave inversions, has 35 pack year smoking history.  Pharmacy consulted to start IV heparin for ACS/STEMI.  Pt received a dose of SQ heparin 5000 units at 1334, therefore will not give bolus IV heparin.      Renal:  ARI - CrCl ~ 25 ml/min, SCr 2.50  CBC: plts slightly low, hgb up 13.2-->16.0  No bleeding documented  Heparin dosing weight: 72 kg  Baseline PT/INR: 17.2/1.44  Goal of  Therapy:  Heparin level 0.3-0.7 units/ml Monitor platelets by anticoagulation protocol: Yes   Plan:  1.  D/C SQ heparin order.  2.  IV heparin infusion rate @ 850 units/hr = 8.79m/hr  (~12 units/kg/hr) 3.  Check 1st heparin level in 8 hours, daily HLs, CBC  CRalene Bathe PharmD 03/05/2013 4:32 PM  Pager: [phone]

## 2013-03-05 NOTE — ED Notes (Signed)
Per EMS, pt had 80 mg of a relative's Opana along with her other medications for back pain.  Pt given 78m Narcan, 461mZofran and 13m61mlbuterol en route.  Pt became more alert and resp status increased post Narcan, initially pt was unresponsive.  Pt on CPAP, unable to read SpO2. Pt able to nod head to questions at this time.

## 2013-03-05 NOTE — H&P (Signed)
PULMONARY  / CRITICAL CARE MEDICINE  Name: Phyllis [name] MRN: [id number] DOB: 1956/09/21    ADMISSION DATE:  03/05/2013   REFERRING MD :  EDP PRIMARY SERVICE:PCCM  CHIEF COMPLAINT:  Overdose  BRIEF PATIENT DESCRIPTION: 57 yo female smoker found unresponsive from opana overdose.  Found in ED to have fever (Tm 101F), respiratory acidosis, elevated lactic acid, aspiration pneumonia and altered mental status.  PCCM asked to admit.  SIGNIFICANT EVENTS:  STUDIES:    LINES / TUBES: 6-2 Lt IJ CVL >>   CULTURES: 6-2 bc x 2 >> 6-2 uc>>  ANTIBIOTICS: 6-2 vanc>> 6-2 zoysn>>  HISTORY OF PRESENT ILLNESS:   57 yo smoker with chronic back and leg pain was found unresponsive in [employer] parking lot pm of 6-1 unresponsive after having taken a relatives opana for leg pain. She refused admit and returned home. Found unresponsive and transported to [facility] ED with ,fever, resp distress, elevated lactic acid and radiographic findings consistent with aspiration. PCCM is consulted and asked to admit. She meets criteria for sepsis and may required intubation.   PAST MEDICAL HISTORY :  Past Medical History  Diagnosis Date  . Eczema   . ASTHMA 06/06/2009  . BACK PAIN 08/06/2010  . DEPRESSION 06/06/2009  . Edema 05/20/2010  . GERD 06/06/2009  . HEPATITIS C WITHOUT HEPATIC COMA 06/06/2009  . HYPERTENSION 06/06/2009  . OSTEOPOROSIS 06/06/2009  . 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 TOBACCO USE 06/05/2010  . Unspecified hypothyroidism 06/05/2010  . VITAMIN D DEFICIENCY 06/06/2009  . Chronic pancreatitis    Past Surgical History  Procedure Laterality Date  . Cholecystectomy    . Breast lumpectomy      benign, right  . Tubal ligation    . Tonsillectomy    . Lumbar laminectomy     Prior to Admission medications   Medication Sig Start Date End Date Taking? Authorizing Provider  ALPRAZolam Duanne Moron) 0.5 MG tablet TAKE 1 TABLET BY MOUTH THREE TIMES DAILY AS NEEDED 02/09/13   [name], MD  gabapentin (NEURONTIN) 300 MG capsule Take 1 capsule (300 mg total) by mouth 3 (three) times daily. 12/22/12   [name], MD  HYDROcodone-acetaminophen [facility]) 10-325 MG per tablet  12/04/12   Historical Provider, MD  levothyroxine (SYNTHROID) 25 MCG tablet Take 25 mcg by mouth daily.      Historical Provider, MD  mometasone-formoterol (DULERA) 100-5 MCG/ACT AERO Inhale 2 puffs into the lungs 2 (two) times daily. 12/13/11   [name], MD  potassium chloride SA (KLOR-CON M15) 15 MEQ tablet Take 1 tablet (15 mEq total) by mouth 2 (two) times daily. 04/15/11 12/21/12  [name], MD  pregabalin (LYRICA) 75 MG capsule Take 1 capsule (75 mg total) by mouth 2 (two) times daily. 08/23/12   [name], MD  triamcinolone (KENALOG) 0.1 % ointment Apply topically 3 (three) times daily.      Historical Provider, MD  valsartan-hydrochlorothiazide (DIOVAN-HCT) 160-12.5 MG per tablet Take 1 tablet by mouth daily. 03/16/12   [name], MD   Allergies  Allergen Reactions  . Meperidine Hcl     hallucinations  . Naproxen     Extreme bruising    FAMILY HISTORY:  Family History  Problem Relation Age of Onset  . COPD Father   . Hypertension Mother   . Colon cancer Neg Hx   . Stomach cancer Neg Hx    SOCIAL HISTORY:  reports that she has been smoking Cigarettes.  She has a 35 pack-year smoking history. She has never used smokeless tobacco. She reports that she does not drink alcohol or use illicit drugs.  REVIEW OF SYSTEMS: Negative except above.  SUBJECTIVE:  C/o back pain.  Denies chest pain.  VITAL SIGNS: Temp:  [101 F (38.3 C)] 101 F (38.3 C) (06/02 0433) Pulse Rate:  [108-118] 108 (06/02 0500) Resp:  [17-29] 17 (06/02 0745) BP: (95-126)/(47-101) 126/56 mmHg (06/02 0745) SpO2:  [93 %-100 %] 93 % (06/02 0500) HEMODYNAMICS:   VENTILATOR SETTINGS:   INTAKE / OUTPUT: Intake/Output   None     PHYSICAL EXAMINATION: General:  Looks older than stated age Neuro:   Follows commands by mae x 4, normal strength HEENT:  Poor dentition Cardiovascular:  HSR RRR Lungs:  Rhonchi and wheezes thru out Abdomen: sft + bs Musculoskeletal:  Intact Skin:  warm  LABS:  Recent Labs Lab 03/05/13 0428 03/05/13 0555 03/05/13 0611 03/05/13 0645 03/05/13 0712  HGB  --  13.2 16.0*  --   --   WBC  --  14.6*  --   --   --   PLT  --  134*  --   --   --   NA  --  136 139  --   --   K  --  4.9 4.8  --   --   CL  --  103 109  --   --   CO2  --  18*  --   --   --   GLUCOSE  --  141* 137*  --   --   BUN  --  15 17  --   --   CREATININE  --  2.70* 2.50*  --   --   CALCIUM  --  6.7*  --   --   --   AST  --  74*  --   --   --   ALT  --  25  --   --   --   ALKPHOS  --  94  --   --   --   BILITOT  --  0.5  --   --   --   PROT  --  6.4  --   --   --   ALBUMIN  --  2.4*  --   --   --   APTT  --   --   --   --  32  INR  --   --   --   --  1.44  LATICACIDVEN  --   --  5.24*  --  3.2*  TROPONINI  --   --   --   --  2.32*  PROCALCITON  --   --   --  0.45  --   PHART 7.177*  --   --   --   --   PCO2ART 46.3*  --   --   --   --   PO2ART 68.3*  --   --   --   --     Recent Labs Lab 03/05/13 0431  GLUCAP 134*    CXR Dg Chest Port 1 View  03/05/2013   *RADIOLOGY REPORT*  Clinical Data: Line placement.  PORTABLE CHEST - 1 VIEW  Comparison: Chest 03/05/2013 at two 02/05/1979.  Findings: The patient has a new left IJ approach central venous catheter with the tip projecting over the mid to lower superior vena cava.  No pneumothorax is identified.  There has  been marked worsening of aeration in the right lung base. There is interstitial edema.  IMPRESSION:  1.  Right IJ catheter tip projects over the superior vena cava.  No pneumothorax. 2.  Marked worsening in right basilar aeration could be due to aspiration or atelectasis. 3.  Interstitial edema.   Original Report Authenticated By: Orlean Patten, M.D.   Dg Chest Port 1 View  03/05/2013   *RADIOLOGY REPORT*  Clinical  Data: Altered mental status; fever.  Apparent overdose.  PORTABLE CHEST - 1 VIEW  Comparison: Chest radiograph performed 03/20/2012  Findings: The lungs are well-aerated.  Mild bibasilar airspace opacities may reflect mild pneumonia or possibly mildly asymmetric interstitial edema.  Mild vascular congestion is noted.  No definite pleural effusion or pneumothorax is seen.  The cardiomediastinal silhouette is borderline normal in size; calcification is noted within the aortic arch.  No acute osseous abnormalities are seen.  IMPRESSION: Mild bibasilar airspace opacities may reflect mild pneumonia or possibly mildly asymmetric interstitial edema, right greater than left.  Mild vascular congestion noted.   Original Report Authenticated By: Santa Lighter, M.D.     ASSESSMENT / PLAN:  PULMONARY A: Acute hypercapnic/hypoxic respiratory failure 2nd to opiate overdose, aspiration pneumonitis, and hx of tobacco abuse. P:   -f/u CXR -continue BD's -wean off solumedrol quickly -oxygen to keep SpO2 > 92%  CARDIOVASCULAR A: Severe sepsis w/o septic shock Elevated troponin >> ? Demand ischemia in setting of sepsis and hypoxia. P:  -fluids to kee CVP > 6 -pressors for MAP < 65 -f/u troponin, ECG, Echo -start ASA -hold beta blocker until hemodynamics stable -may need cardiology evaluation   RENAL A: Acute renal failure >> likely pre-renal and ATN from hypoxia/sepsis. Baseline creatinine 0.7 from 12/21/12. Lactic acidosis. P:   -monitor renal fx, urine outpt, electrolytes  GASTROINTESTINAL A: Hx of Hep C >> followed by Dr. Olevia Perches as outpt. Nutrition. P:   -regular diet  HEMATOLOGIC A: Polycythemia >> likely from hemoconcentration. P:  -f/u CBC -SQ heparin for DVT prevention INFECTIOUS A: Aspiration Pneumonitis. P:   -D1/x vancomycin, zosyn  ENDOCRINE A: Hx of hypothyroidism. P:   -Continue synthroid  NEUROLOGIC A: Acute metabolic encephalopathy 2nd to sepsis, renal failure,  respiratory failure. Opiate overdose. Chronic pain. P:   -monitor mental status -limit narcotics   Richardson Landry Minor ACNP Maryanna Shape PCCM Pager [phone] till 3 pm If no answer page [phone] 03/05/2013, 9:03 AM  Reviewed above, examined pt, and agree with assessment/plan.  She has opiate overdose in setting of chronic pain complicated by change in mental status with aspiration pneumonia, acute renal failure, and severe sepsis.  CC time 40 minutes.  Chesley Mires, MD [facility] Pulmonary/Critical Care 03/05/2013, 9:13 AM Pager:  [phone] After 3pm call: [phone]

## 2013-03-05 NOTE — Progress Notes (Signed)
ANTIBIOTIC CONSULT NOTE - INITIAL  Pharmacy Consult for vancomyci/zosyn Indication: pneumonia/Code sepsis  Allergies  Allergen Reactions  . Meperidine Hcl     hallucinations  . Naproxen     Extreme bruising    Patient Measurements:   Adjusted Body Weight:   Vital Signs: Temp: 101 F (38.3 C) (06/02 0433) Temp src: Rectal (06/02 0433) BP: 126/56 mmHg (06/02 0745) Pulse Rate: 108 (06/02 0500) Intake/Output from previous day:   Intake/Output from this shift:    Labs:  Recent Labs  03/05/13 0555 03/05/13 0611  WBC 14.6*  --   HGB 13.2 16.0*  PLT 134*  --   CREATININE 2.70* 2.50*   The CrCl is unknown because both a height and weight (above a minimum accepted value) are required for this calculation. No results found for this basename: VANCOTROUGH, VANCOPEAK, VANCORANDOM, GENTTROUGH, GENTPEAK, GENTRANDOM, TOBRATROUGH, TOBRAPEAK, TOBRARND, AMIKACINPEAK, AMIKACINTROU, AMIKACIN,  in the last 72 hours   Microbiology: No results found for this or any previous visit (from the past 720 hour(s)).  Medical History: Past Medical History  Diagnosis Date  . Eczema   . ASTHMA 06/06/2009  . BACK PAIN 08/06/2010  . DEPRESSION 06/06/2009  . Edema 05/20/2010  . GERD 06/06/2009  . HEPATITIS C WITHOUT HEPATIC COMA 06/06/2009  . HYPERTENSION 06/06/2009  . OSTEOPOROSIS 06/06/2009  . 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 TOBACCO USE 06/05/2010  . Unspecified hypothyroidism 06/05/2010  . VITAMIN D DEFICIENCY 06/06/2009  . Chronic pancreatitis     Assessment: 75 YOF admitted with intentional drug (opioid) OD and decreased mental status (found unresponsive). Starting broad spectrum antibiotics for early sepsis and likely aspiration pneumonia. Admission labs reveal Acute renal insufficiency. Doses sent to ED but yet to be given (Floor RN to give)  6/2 >> vancomycin   >> 6/2 >> zosyn >>    Tmax: 101 WBCs: 14.6 Renal: Scr = 2.5 for est CrCl=  14m/min (N) Lactic acid:  3.2 PCT: 0.45  6/2 blood: 6/2 urine:  6/2 sputum:   Goal of Therapy:  Vancomycin trough level 15-20 mcg/ml  Plan:   Vancomycin 755mIV q24h  Give dose this afternoon for staggered LD (to get 1gm this am)  Zosyn 3.375gm IV q8h with 4h infusion  Start 6h after 1st dose  Monitor renal fx and adjust dose and/or check levels as indicated.   ZeClovis Riley/11/2012,8:59 AM

## 2013-03-06 DIAGNOSIS — Z5189 Encounter for other specified aftercare: Secondary | ICD-10-CM

## 2013-03-06 DIAGNOSIS — J45901 Unspecified asthma with (acute) exacerbation: Secondary | ICD-10-CM | POA: Diagnosis present

## 2013-03-06 DIAGNOSIS — J690 Pneumonitis due to inhalation of food and vomit: Secondary | ICD-10-CM

## 2013-03-06 LAB — BASIC METABOLIC PANEL
GFR calc Af Amer: >90 mL/min (ref >90)
GFR calc non Af Amer: >90 mL/min (ref >90)
Potassium: 4.3 mEq/L (ref 3.5–5.1)
Sodium: 137 mEq/L (ref 135–145)

## 2013-03-06 LAB — URINE CULTURE: Colony Count: 45000

## 2013-03-06 LAB — CBC
Hemoglobin: 11.8 g/dL — ABNORMAL LOW (ref 12.0–15.0)
MCH: 31.2 pg (ref 26.0–34.0)
MCHC: 31.8 g/dL (ref 30.0–36.0)
MCV: 98.1 fL (ref 78.0–100.0)
RBC: 3.78 MIL/uL — ABNORMAL LOW (ref 3.87–5.11)

## 2013-03-06 LAB — CK TOTAL AND CKMB (NOT AT ARMC)
CK, MB: 30.8 ng/mL (ref 0.3–4.0)
CK, MB: 26.2 ng/mL (ref 0.3–4.0)
Relative Index: 0.7 (ref 0.0–2.5)
Relative Index: 0.7 (ref 0.0–2.5)
Total CK: 4357 U/L — ABNORMAL HIGH (ref 7–177)
Total CK: 4971 U/L — ABNORMAL HIGH (ref 7–177)

## 2013-03-06 LAB — GLUCOSE, CAPILLARY
Glucose-Capillary: 73 mg/dL (ref 70–99)
Glucose-Capillary: 97 mg/dL (ref 70–99)

## 2013-03-06 LAB — TROPONIN I: Troponin I: 5.18 ng/mL (ref <0.30)

## 2013-03-06 LAB — HEPARIN LEVEL (UNFRACTIONATED): Heparin Unfractionated: 0.3 IU/mL (ref 0.30–0.70)

## 2013-03-06 MED ORDER — HYDROCODONE-ACETAMINOPHEN 5-325 MG PO TABS
1.0000 | ORAL_TABLET | Freq: Four times a day (QID) | ORAL | Status: DC | PRN
  Administered 2013-03-06 – 2013-03-07 (×3): 2 via ORAL
  Administered 2013-03-07: 1 via ORAL
  Administered 2013-03-07 – 2013-03-08 (×5): 2 via ORAL
  Filled 2013-03-06 (×9): qty 2

## 2013-03-06 MED ORDER — GABAPENTIN 600 MG PO TABS
300.0000 mg | ORAL_TABLET | Freq: Three times a day (TID) | ORAL | Status: DC
  Filled 2013-03-06 (×3): qty 0.5

## 2013-03-06 MED ORDER — HEPARIN (PORCINE) IN NACL 100-0.45 UNIT/ML-% IJ SOLN
950.0000 [IU]/h | INTRAMUSCULAR | Status: DC
  Administered 2013-03-06 – 2013-03-07 (×2): 950 [IU]/h via INTRAVENOUS
  Filled 2013-03-06 (×5): qty 250

## 2013-03-06 MED ORDER — ALBUTEROL SULFATE (5 MG/ML) 0.5% IN NEBU
2.5000 mg | INHALATION_SOLUTION | Freq: Four times a day (QID) | RESPIRATORY_TRACT | Status: DC
  Administered 2013-03-06 – 2013-03-08 (×10): 2.5 mg via RESPIRATORY_TRACT
  Filled 2013-03-06 (×10): qty 0.5

## 2013-03-06 MED ORDER — METOPROLOL TARTRATE 12.5 MG HALF TABLET
12.5000 mg | ORAL_TABLET | Freq: Two times a day (BID) | ORAL | Status: DC
  Administered 2013-03-06 – 2013-03-08 (×5): 12.5 mg via ORAL
  Filled 2013-03-06 (×7): qty 1

## 2013-03-06 MED ORDER — ALPRAZOLAM 0.25 MG PO TABS
0.5000 mg | ORAL_TABLET | Freq: Three times a day (TID) | ORAL | Status: DC | PRN
  Administered 2013-03-06 (×2): 0.5 mg via ORAL
  Filled 2013-03-06 (×2): qty 1

## 2013-03-06 MED ORDER — GABAPENTIN 300 MG PO CAPS
300.0000 mg | ORAL_CAPSULE | Freq: Three times a day (TID) | ORAL | Status: DC
  Administered 2013-03-06 – 2013-03-08 (×7): 300 mg via ORAL
  Filled 2013-03-06 (×10): qty 1

## 2013-03-06 MED ORDER — VANCOMYCIN HCL IN DEXTROSE 1-5 GM/200ML-% IV SOLN
1000.0000 mg | Freq: Two times a day (BID) | INTRAVENOUS | Status: DC
  Administered 2013-03-06 – 2013-03-07 (×4): 1000 mg via INTRAVENOUS
  Filled 2013-03-06 (×5): qty 200

## 2013-03-06 NOTE — Progress Notes (Signed)
ANTIBIOTIC CONSULT NOTE - Follow-up  Pharmacy Consult for vancomycin/zosyn Indication: pneumonia/GPC clusters blood cx  Allergies  Allergen Reactions  . Meperidine Hcl     hallucinations  . Naproxen     Extreme bruising    Patient Measurements: Height: _0  (165.1 cm) Weight: 160 lb 4.4 oz (72.7 kg) IBW/kg (Calculated) : 57 Adjusted Body Weight:   Vital Signs: Temp: 98.4 F (36.9 C) (06/03 0800) Temp src: Oral (06/03 0800) BP: 140/48 mmHg (06/03 1000) Pulse Rate: 86 (06/03 1000) Intake/Output from previous day: 06/02 0701 - 06/03 0700 In: 3670.5 [P.O.:50; I.V.:2120.5; IV Piggyback:1500] Out: 1224 [Urine:1725] Intake/Output from this shift: Total I/O In: 386 [P.O.:120; I.V.:228.5; IV Piggyback:37.5] Out: -   Labs:  Recent Labs  03/05/13 0555 03/05/13 0611 03/06/13 0400 03/06/13 0853  WBC 14.6*  --  10.8*  --   HGB 13.2 16.0* 11.8*  --   PLT 134*  --  98*  --   CREATININE 2.70* 2.50*  --  0.78   Estimated Creatinine Clearance: 76.6 ml/min (by C-G formula based on Cr of 0.78). No results found for this basename: VANCOTROUGH, VANCOPEAK, VANCORANDOM, GENTTROUGH, GENTPEAK, GENTRANDOM, TOBRATROUGH, TOBRAPEAK, TOBRARND, AMIKACINPEAK, AMIKACINTROU, AMIKACIN,  in the last 72 hours   Microbiology: Recent Results (from the past 720 hour(s))  CULTURE, BLOOD (ROUTINE X 2)     Status: None   Collection Time    03/05/13  5:50 AM      Result Value Range Status   Specimen Description BLOOD RIGHT EXTERNAL JUGULAR   Final   Special Requests NONE   Final   Culture  Setup Time 03/05/2013 10:36   Final   Culture     Final   Value:        BLOOD CULTURE RECEIVED NO GROWTH TO DATE CULTURE WILL BE HELD FOR 5 DAYS BEFORE ISSUING A FINAL NEGATIVE REPORT   Report Status PENDING   Incomplete  CULTURE, BLOOD (ROUTINE X 2)     Status: None   Collection Time    03/05/13  7:12 AM      Result Value Range Status   Specimen Description BLOOD LEFT INTERNAL JUGULAR   Final   Special  Requests BOTTLES DRAWN AEROBIC AND ANAEROBIC [facility] EACH   Final   Culture  Setup Time 03/05/2013 10:36   Final   Culture     Final   Value: GRAM POSITIVE COCCI IN CLUSTERS     Note: Gram Stain Report Called to,Read Back By and Verified With: TIA JOHNSON 03/06/13  5:34A.Reita Cliche   Report Status PENDING   Incomplete  MRSA PCR SCREENING     Status: None   Collection Time    03/05/13  8:40 AM      Result Value Range Status   MRSA by PCR NEGATIVE  NEGATIVE Final   Comment:            The GeneXpert MRSA Assay (FDA     approved for NASAL specimens     only), is one component of a     comprehensive MRSA colonization     surveillance program. It is not     intended to diagnose MRSA     infection nor to guide or     monitor treatment for     MRSA infections.  CULTURE, EXPECTORATED SPUTUM-ASSESSMENT     Status: None   Collection Time    03/05/13  1:05 PM      Result Value Range Status   Specimen Description SPUTUM  Final   Special Requests NONE   Final   Sputum evaluation     Final   Value: THIS SPECIMEN IS ACCEPTABLE. RESPIRATORY CULTURE REPORT TO FOLLOW.   Report Status 03/05/2013 FINAL   Final    Medical History: Past Medical History  Diagnosis Date  . Eczema   . ASTHMA 06/06/2009  . BACK PAIN 08/06/2010  . DEPRESSION 06/06/2009  . Edema 05/20/2010  . GERD 06/06/2009  . HEPATITIS C WITHOUT HEPATIC COMA 06/06/2009  . HYPERTENSION 06/06/2009  . OSTEOPOROSIS 06/06/2009  . 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 TOBACCO USE 06/05/2010  . Unspecified hypothyroidism 06/05/2010  . VITAMIN D DEFICIENCY 06/06/2009  . Chronic pancreatitis     Assessment: 40 YOF admitted with intentional drug (opioid) OD and decreased mental status (found unresponsive). Starting broad spectrum antibiotics for early sepsis and likely aspiration pneumonia. Admission labs reveal Acute renal insufficiency. Doses sent to ED but yet to be given (Floor RN to give)  6/2 >> vancomycin >> 6/2 >> zosyn >>    Tmax: 99.2 WBCs: 10.8 Renal: ARF resolved, Scr = 0.78 for est CrCl= 40m/min (CG), 89 (N) Lactic acid: 3.2 PCT: 0.45  6/2 blood: GPC clusters 1/2 6/2 urine: pending 6/2 sputum: pending  Dose changes/drug level info:    Goal of Therapy:  Vancomycin trough level 15-20 mcg/ml  Plan:   For improved renal functions change Vancomycin to 1gm IV q12h  Continue Zosyn 3.375gm IV q8h with 4h infusion  Monitor renal fx and adjust dose and/or check levels as indicated.   Await final cultures, r/o contaminant vs pathogen in blood cx  DDoreene Eland PharmD, BCPS.   Pager: [phone]/12/2012,10:23 AM

## 2013-03-06 NOTE — Progress Notes (Signed)
ANTICOAGULATION CONSULT NOTE - Follow Up Consult  Pharmacy Consult for Heparin Indication: chest pain/ACS  Allergies  Allergen Reactions  . Meperidine Hcl     hallucinations  . Naproxen     Extreme bruising    Patient Measurements: Height: _0  (165.1 cm) Weight: 160 lb 4.4 oz (72.7 kg) IBW/kg (Calculated) : 57 Heparin Dosing Weight:   Vital Signs: Temp: 98.5 F (36.9 C) (06/03 0000) Temp src: Oral (06/03 0000) BP: 174/76 mmHg (06/03 0200) Pulse Rate: 87 (06/03 0200)  Labs:  Recent Labs  03/05/13 0555 03/05/13 4944 03/05/13 0712  03/05/13 1825 03/05/13 2035 03/05/13 2345 03/06/13 0115 03/06/13 0400  HGB 13.2 16.0*  --   --   --   --   --   --  11.8*  HCT 42.6 47.0*  --   --   --   --   --   --  37.1  PLT 134*  --   --   --   --   --   --   --  PENDING  APTT  --   --  32  --   --   --   --   --   --   LABPROT  --   --  17.2*  --   --   --   --   --   --   INR  --   --  1.44  --   --   --   --   --   --   HEPARINUNFRC  --   --   --   --   --   --   --  0.30  --   CREATININE 2.70* 2.50*  --   --   --   --   --   --   --   CKTOTAL  --   --   --   --  5441*  --  4971*  --   --   CKMB  --   --   --   --  45.2*  --  35.7*  --   --   TROPONINI  --   --  2.32*  < > 5.44* 5.19* 5.18*  --   --   < > = values in this interval not displayed.  Estimated Creatinine Clearance: 24.5 ml/min (by C-G formula based on Cr of 2.5).   Medications:  Infusions:  . sodium chloride 100 mL/hr at 03/05/13 1954  . heparin 950 Units/hr (03/06/13 0530)    Assessment: Patient with heparin level at goal but lower end of goal.  No issues per RN.   Goal of Therapy:  Heparin level 0.3-0.7 units/ml Monitor platelets by anticoagulation protocol: Yes   Plan:  Increase heparin to 950 units/hr, recheck level in 8hr.  Tyler Deis, Shea Stakes Crowford 03/06/2013,5:30 AM

## 2013-03-06 NOTE — Progress Notes (Signed)
PULMONARY  / CRITICAL CARE MEDICINE  Name: Phyllis [name] MRN: [id number] DOB: 1955-12-16    ADMISSION DATE:  03/05/2013   REFERRING MD :  EDP PRIMARY SERVICE:PCCM  CHIEF COMPLAINT:  Overdose  BRIEF PATIENT DESCRIPTION: 57 yo female smoker found unresponsive from opana overdose.  Found in ED to have fever (Tm 101F), respiratory acidosis, elevated lactic acid, aspiration pneumonia and altered mental status.  PCCM asked to admit.  SIGNIFICANT EVENTS: 6/02 Admit, acute encephalopathy 2nd to opiate OD, aspiration with hypoxia/hypercapnia, sespis, NSTEMI >> card's consulted 6/03 Transfer to telemetry  STUDIES:  6-2 Echo >> EF 60 to 07%, grade 1 diastolic dysfx, PAS 39 mmHg  LINES / TUBES: 6-2 Lt IJ CVL >> 6-3  CULTURES: 6-2 bc x 2 >> GPC in clusters >> 6-2 sputum >> 6-2 uc>>  ANTIBIOTICS: 6-2 vanc>> 6-2 zoysn>>  SUBJECTIVE:  Still has cough with chest congestion and wheeze, but better.  Denies chest pain.  C/o generalized abdominal pain >> reports this is chronic.  Tolerating diet >> denies nausea.  VITAL SIGNS: Temp:  [98.3 F (36.8 C)-99.2 F (37.3 C)] 98.3 F (36.8 C) (06/03 0400) Pulse Rate:  [79-106] 80 (06/03 0800) Resp:  [11-19] 15 (06/03 0800) BP: (113-184)/(36-139) 165/139 mmHg (06/03 0800) SpO2:  [94 %-100 %] 99 % (06/03 0800) FiO2 (%):  [40 %-100 %] 40 % (06/02 1600) Weight:  [160 lb 4.4 oz (72.7 kg)] 160 lb 4.4 oz (72.7 kg) (06/02 1000) HEMODYNAMICS: CVP:  [8 mmHg-12 mmHg] 8 mmHg 3 liters [place]  INTAKE / OUTPUT: Intake/Output     06/02 0701 - 06/03 0700 06/03 0701 - 06/04 0700   P.O. 50    I.V. (mL/kg) 2120.5 (29.2) 109.5 (1.5)   IV Piggyback 1500    Total Intake(mL/kg) 3670.5 (50.5) 109.5 (1.5)   Urine (mL/kg/hr) 1725 (1)    Total Output 1725     Net +1945.5 +109.5          PHYSICAL EXAMINATION: General: No distress Neuro: Alert, follows commands, normal strength HEENT:  Poor dentition, no sinus tenderness Cardiovascular: regular, no  murmur Lungs: b/l rhonchi with wheezing Abdomen: soft, non tender, + bowel sounds Musculoskeletal: no edema Skin: no rashes  LABS:  Recent Labs Lab 03/05/13 0428 03/05/13 0555 03/05/13 0611 03/05/13 0645 03/05/13 0712  03/05/13 2035 03/05/13 2345 03/06/13 0400  HGB  --  13.2 16.0*  --   --   --   --   --  11.8*  WBC  --  14.6*  --   --   --   --   --   --  10.8*  PLT  --  134*  --   --   --   --   --   --  98*  NA  --  136 139  --   --   --   --   --   --   K  --  4.9 4.8  --   --   --   --   --   --   CL  --  103 109  --   --   --   --   --   --   CO2  --  18*  --   --   --   --   --   --   --   GLUCOSE  --  141* 137*  --   --   --   --   --   --  BUN  --  15 17  --   --   --   --   --   --   CREATININE  --  2.70* 2.50*  --   --   --   --   --   --   CALCIUM  --  6.7*  --   --   --   --   --   --   --   AST  --  74*  --   --   --   --   --   --   --   ALT  --  25  --   --   --   --   --   --   --   ALKPHOS  --  94  --   --   --   --   --   --   --   BILITOT  --  0.5  --   --   --   --   --   --   --   PROT  --  6.4  --   --   --   --   --   --   --   ALBUMIN  --  2.4*  --   --   --   --   --   --   --   APTT  --   --   --   --  32  --   --   --   --   INR  --   --   --   --  1.44  --   --   --   --   LATICACIDVEN  --   --  5.24*  --  3.2*  --   --   --   --   TROPONINI  --   --   --   --  2.32*  < > 5.19* 5.18* 4.54*  PROCALCITON  --   --   --  0.45  --   --   --   --   --   PHART 7.177*  --   --   --   --   --   --   --   --   PCO2ART 46.3*  --   --   --   --   --   --   --   --   PO2ART 68.3*  --   --   --   --   --   --   --   --   < > = values in this interval not displayed.  Recent Labs Lab 03/05/13 1142 03/05/13 1552 03/05/13 1947 03/05/13 2347 03/06/13 0454  GLUCAP 154* 137* 127* 73 97    CXR Dg Chest Port 1 View  03/05/2013   *RADIOLOGY REPORT*  Clinical Data: Line placement.  PORTABLE CHEST - 1 VIEW  Comparison: Chest 03/05/2013 at two 02/05/1979.   Findings: The patient has a new left IJ approach central venous catheter with the tip projecting over the mid to lower superior vena cava.  No pneumothorax is identified.  There has been marked worsening of aeration in the right lung base. There is interstitial edema.  IMPRESSION:  1.  Right IJ catheter tip projects over the superior vena cava.  No pneumothorax. 2.  Marked worsening in right basilar aeration could be due to aspiration or atelectasis. 3.  Interstitial edema.   Original Report Authenticated  By: Orlean Patten, M.D.   Dg Chest Port 1 View  03/05/2013   *RADIOLOGY REPORT*  Clinical Data: Altered mental status; fever.  Apparent overdose.  PORTABLE CHEST - 1 VIEW  Comparison: Chest radiograph performed 03/20/2012  Findings: The lungs are well-aerated.  Mild bibasilar airspace opacities may reflect mild pneumonia or possibly mildly asymmetric interstitial edema.  Mild vascular congestion is noted.  No definite pleural effusion or pneumothorax is seen.  The cardiomediastinal silhouette is borderline normal in size; calcification is noted within the aortic arch.  No acute osseous abnormalities are seen.  IMPRESSION: Mild bibasilar airspace opacities may reflect mild pneumonia or possibly mildly asymmetric interstitial edema, right greater than left.  Mild vascular congestion noted.   Original Report Authenticated By: Santa Lighter, M.D.     ASSESSMENT / PLAN:  PULMONARY A: Acute hypercapnic/hypoxic respiratory failure 2nd to opiate overdose, aspiration pneumonitis, acute asthma exacerbation and hx of tobacco abuse. P:   -f/u CXR 6/04 -scheduled BD's -continue solumedrol -oxygen to keep SpO2 > 92%  CARDIOVASCULAR A: Severe sepsis w/o septic shock >> hemodynamics improved 6/03. NSTEMI >> ? Demand ischemia in setting of sepsis and hypoxia; appreciate assistance from Dr. Terrence Dupont. P:  -continue lipitor, plavix, heparin gtt, ASA per cardiology -hold beta blocker in setting of acute asthma  exacerbation -may need further cardiac assessment >> defer to cardiology  RENAL A: Acute renal failure >> likely pre-renal and ATN from hypoxia/sepsis. Baseline creatinine 0.7 from 12/21/12. Lactic acidosis. P:   -monitor renal fx, urine outpt, electrolytes  GASTROINTESTINAL A: Hx of Hep C >> followed by Dr. Olevia Perches as outpt. Nutrition. P:   -regular diet  HEMATOLOGIC A: Polycythemia >> likely from hemoconcentration; resolved 6/03. Thrombocytopenia >> likely from sepsis. P:  -f/u CBC  INFECTIOUS A: Aspiration Pneumonitis.   GPC in blood culture. P:   -D2/x vancomycin, zosyn -d/c CVL 6/03  ENDOCRINE A: Hx of hypothyroidism. P:   -Continue synthroid  NEUROLOGIC A: Acute metabolic encephalopathy 2nd to sepsis, renal failure, respiratory failure >> much improved 6/03. Opiate overdose. Chronic pain. P:   -resume neurontin, norco, and prn xanax  Transfer to telemetry 6/03.  She is followed by Dr. [name] with [facility] as her PCP.  Will ask [facility] to assume care from 6/04 and PCCM sign off.  Chesley Mires, MD [facility] Pulmonary/Critical Care 03/06/2013, 8:25 AM Pager:  [phone] After 3pm call: [phone]

## 2013-03-06 NOTE — Progress Notes (Signed)
Patient continues to be in a moderate amount of pain with no relief from one time dose of fentanyl. Dr Deterding is aware that the patient continues to be in pain, pt has taken 650 mg of tylenol for continued pain.  Patient states that "she will call her husband to pick her up is she continues to have to suffer in pain". RN tried to reassure and provide emotional support.

## 2013-03-06 NOTE — Progress Notes (Signed)
Subjective:  Patient denies any chest pain states breathing is slightly improved. And also complains of vague abdominal pain. Denies any nausea or vomiting or diarrhea. Her renal function is back to normal  Objective:  Vital Signs in the last 24 hours: Temp:  [97.7 F (36.5 C)-99.2 F (37.3 C)] 97.7 F (36.5 C) (06/03 1046) Pulse Rate:  [79-94] 80 (06/03 1046) Resp:  [13-19] 17 (06/03 1046) BP: (113-184)/(36-139) 177/73 mmHg (06/03 1046) SpO2:  [93 %-100 %] 94 % (06/03 1046) FiO2 (%):  [40 %-50 %] 40 % (06/02 1600)  Intake/Output from previous day: 06/02 0701 - 06/03 0700 In: 3670.5 [P.O.:50; I.V.:2120.5; IV Piggyback:1500] Out: 1655 [Urine:1725] Intake/Output from this shift: Total I/O In: 386 [P.O.:120; I.V.:228.5; IV Piggyback:37.5] Out: -   Physical Exam: Neck: no adenopathy, no carotid bruit, no JVD and supple, symmetrical, trachea midline Lungs: Decrease vessel at bases with bilateral rhonchi right more than left Heart: regular rate and rhythm, S1, S2 normal, no murmur, click, rub or gallop Abdomen: soft, non-tender; bowel sounds normal; no masses,  no organomegaly Extremities: extremities normal, atraumatic, no cyanosis or edema  Lab Results:  Recent Labs  03/05/13 0555 03/05/13 0611 03/06/13 0400  WBC 14.6*  --  10.8*  HGB 13.2 16.0* 11.8*  PLT 134*  --  98*    Recent Labs  03/05/13 0555 03/05/13 0611 03/06/13 0853  NA 136 139 137  K 4.9 4.8 4.3  CL 103 109 107  CO2 18*  --  25  GLUCOSE 141* 137* 120*  BUN _0 CREATININE 2.70* 2.50* 0.78    Recent Labs  03/05/13 2345 03/06/13 0400  TROPONINI 5.18* 4.54*   Hepatic Function Panel  Recent Labs  03/05/13 0555  PROT 6.4  ALBUMIN 2.4*  AST 74*  ALT 25  ALKPHOS 94  BILITOT 0.5   No results found for this basename: CHOL,  in the last 72 hours No results found for this basename: PROTIME,  in the last 72 hours  Imaging: Imaging results have been reviewed and Dg Chest Port 1  View  03/05/2013   *RADIOLOGY REPORT*  Clinical Data: Line placement.  PORTABLE CHEST - 1 VIEW  Comparison: Chest 03/05/2013 at two 02/05/1979.  Findings: The patient has a new left IJ approach central venous catheter with the tip projecting over the mid to lower superior vena cava.  No pneumothorax is identified.  There has been marked worsening of aeration in the right lung base. There is interstitial edema.  IMPRESSION:  1.  Right IJ catheter tip projects over the superior vena cava.  No pneumothorax. 2.  Marked worsening in right basilar aeration could be due to aspiration or atelectasis. 3.  Interstitial edema.   Original Report Authenticated By: Orlean Patten, M.D.   Dg Chest Port 1 View  03/05/2013   *RADIOLOGY REPORT*  Clinical Data: Altered mental status; fever.  Apparent overdose.  PORTABLE CHEST - 1 VIEW  Comparison: Chest radiograph performed 03/20/2012  Findings: The lungs are well-aerated.  Mild bibasilar airspace opacities may reflect mild pneumonia or possibly mildly asymmetric interstitial edema.  Mild vascular congestion is noted.  No definite pleural effusion or pneumothorax is seen.  The cardiomediastinal silhouette is borderline normal in size; calcification is noted within the aortic arch.  No acute osseous abnormalities are seen.  IMPRESSION: Mild bibasilar airspace opacities may reflect mild pneumonia or possibly mildly asymmetric interstitial edema, right greater than left.  Mild vascular congestion noted.   Original Report Authenticated By: Dellis Filbert  Radene Knee, M.D.    Cardiac Studies:  Assessment/Plan:  Acute non-Q-wave myocardial infarction  Status post acute hypercarbic respiratory failure  Resolving Aspiration pneumonia  Status post Acute renal failure  Hypertension  Depression  Tobacco abuse  History of bronchial asthma  Chronic pain syndrome  Status post opiate overdose  History of hepatitis C  History of pancreatitis in the past  GERD  Depression  Plan Continue  present management Add low-dose beta blocker Will schedule for left cath possible PTCA stenting on Thursday, 03/08/2013 Consider transfer to [facility] hospital  LOS: 1 day    Phyllis [name] N 03/06/2013, 11:50 AM

## 2013-03-06 NOTE — Progress Notes (Signed)
ANTICOAGULATION CONSULT NOTE - Follow Up Consult  Pharmacy Consult for Heparin Indication: chest pain/ACS  Allergies  Allergen Reactions  . Meperidine Hcl     hallucinations  . Naproxen     Extreme bruising    Patient Measurements: Height: _0  (165.1 cm) Weight: 160 lb 4.4 oz (72.7 kg) IBW/kg (Calculated) : 57 Heparin Dosing Weight:   Vital Signs: Temp: 97.5 F (36.4 C) (06/03 1306) Temp src: Oral (06/03 1306) BP: 148/67 mmHg (06/03 1306) Pulse Rate: 73 (06/03 1306)  Labs:  Recent Labs  03/05/13 0555 03/05/13 2549 03/05/13 0712  03/05/13 2035 03/05/13 2345 03/06/13 0115 03/06/13 0400 03/06/13 0853 03/06/13 1510  HGB 13.2 16.0*  --   --   --   --   --  11.8*  --   --   HCT 42.6 47.0*  --   --   --   --   --  37.1  --   --   PLT 134*  --   --   --   --   --   --  98*  --   --   APTT  --   --  32  --   --   --   --   --   --   --   LABPROT  --   --  17.2*  --   --   --   --   --   --   --   INR  --   --  1.44  --   --   --   --   --   --   --   HEPARINUNFRC  --   --   --   --   --   --  0.30  --   --  0.47  CREATININE 2.70* 2.50*  --   --   --   --   --   --  0.78  --   CKTOTAL  --   --   --   < >  --  4971*  --  4357* 3987*  --   CKMB  --   --   --   < >  --  35.7*  --  30.8* 26.2*  --   TROPONINI  --   --  2.32*  < > 5.19* 5.18*  --  4.54*  --   --   < > = values in this interval not displayed.  Estimated Creatinine Clearance: 76.6 ml/min (by C-G formula based on Cr of 0.78).   Medications:  Scheduled:  . albuterol  2.5 mg Nebulization Q6H  . antiseptic oral rinse  15 mL Mouth Rinse BID  . aspirin  81 mg Oral Daily  . atorvastatin  80 mg Oral q1800  . clopidogrel  75 mg Oral Q breakfast  . gabapentin  300 mg Oral TID  . levothyroxine  25 mcg Oral QAC breakfast  . methylPREDNISolone (SOLU-MEDROL) injection  40 mg Intravenous Q12H  . metoprolol tartrate  12.5 mg Oral BID  . pantoprazole  40 mg Oral Q1200  . piperacillin-tazobactam (ZOSYN)  IV  3.375 g  Intravenous Q8H  . vancomycin  1,000 mg Intravenous BID   Infusions:  . sodium chloride 50 mL/hr at 03/06/13 1554  . heparin 950 Units/hr (03/06/13 1555)   PRN: acetaminophen, albuterol, ALPRAZolam, HYDROcodone-acetaminophen, sodium chloride  Assessment: Patient's heparin level in goal range.  Heparin level = 0.47  Goal of Therapy:  Heparin level 0.3-0.7 units/ml  Monitor platelets by anticoagulation protocol: Yes   Plan:  Continue Heparin at present rate of 950 units/hour. Daily Heparin levels ordered starting in AM.  Gypsy Decant 03/06/2013,4:11 PM

## 2013-03-06 NOTE — Progress Notes (Signed)
Patient had a left IJ line removed today in ICU prior to being transferred to 1420.  During shift change it was reported that the patient's dressing was saturated with bright red blood and the day shift nurse applied pressure and dressing was changed.  An hour later the dressing is saturated again and IV team was paged and asked if they could come and assess.  Danny from the IV team called me back and I reported to him what was going on with the patient's dressing.  Kasandra Knudsen said "since the line was removed it is now the nurses wound to assess."  I will changed the dressing again and reapply pressure and will continue to monitor.  Flonnie Hailstone, RN

## 2013-03-07 ENCOUNTER — Inpatient Hospital Stay (HOSPITAL_COMMUNITY): Payer: Medicare Other

## 2013-03-07 LAB — BASIC METABOLIC PANEL
CO2: 23 mEq/L (ref 19–32)
GFR calc non Af Amer: >90 mL/min (ref >90)
Glucose, Bld: 159 mg/dL — ABNORMAL HIGH (ref 70–99)
Potassium: 4.1 mEq/L (ref 3.5–5.1)
Sodium: 136 mEq/L (ref 135–145)

## 2013-03-07 LAB — CBC
Hemoglobin: 11.2 g/dL — ABNORMAL LOW (ref 12.0–15.0)
MCHC: 31.5 g/dL (ref 30.0–36.0)
Platelets: 97 10*3/uL — ABNORMAL LOW (ref 150–400)
RBC: 3.7 MIL/uL — ABNORMAL LOW (ref 3.87–5.11)
RDW: 13 % (ref 11.5–15.5)
WBC: 9.6 10*3/uL (ref 4.0–10.5)

## 2013-03-07 LAB — CK TOTAL AND CKMB (NOT AT ARMC): Total CK: 2302 U/L — ABNORMAL HIGH (ref 7–177)

## 2013-03-07 MED ORDER — SODIUM CHLORIDE 0.9 % IJ SOLN: 3.0000 mL | INTRAMUSCULAR | Status: DC | PRN

## 2013-03-07 MED ORDER — ASPIRIN 81 MG PO CHEW
324.0000 mg | CHEWABLE_TABLET | ORAL | Status: AC
Start: 2013-03-08 — End: 2013-03-08
  Administered 2013-03-08: 324 mg via ORAL
  Filled 2013-03-07: qty 4

## 2013-03-07 MED ORDER — SODIUM CHLORIDE 0.9 % IV SOLN: 1.0000 mL/kg/h | INTRAVENOUS | Status: DC

## 2013-03-07 MED ORDER — DIAZEPAM 5 MG PO TABS
5.0000 mg | ORAL_TABLET | ORAL | Status: AC
  Administered 2013-03-08: 5 mg via ORAL
  Filled 2013-03-07: qty 1

## 2013-03-07 MED ORDER — SODIUM CHLORIDE 0.9 % IJ SOLN: 3.0000 mL | Freq: Two times a day (BID) | INTRAMUSCULAR | Status: DC

## 2013-03-07 MED ORDER — SODIUM CHLORIDE 0.9 % IV SOLN: 250.0000 mL | INTRAVENOUS | Status: DC | PRN

## 2013-03-07 NOTE — Care Management Note (Signed)
    Page 1 of 2   03/07/2013     12:07:28 PM   CARE MANAGEMENT NOTE 03/07/2013  Patient:  Phyllis [name]   Account Number:  [id number]  Date Initiated:  03/05/2013  Documentation initiated by:  DAVIS,RHONDA  Subjective/Objective Assessment:   overdose of pain meds patient denies intentional     Action/Plan:   home   Anticipated DC Date:  03/08/2013   Anticipated DC Plan:  HOME/SELF CARE  In-house referral  Clinical Social Worker      DC Planning Services  CM consult      [facility] Choice  NA   Choice offered to / List presented to:  NA   DME arranged  NA      DME agency  NA     [place] arranged  NA      [place] agency  NA   Status of service:  In process, will continue to follow Medicare Important Message given?  NA - LOS <3 / Initial given by admissions (If response is "NO", the following Medicare IM given date fields will be blank) Date Medicare IM given:   Date Additional Medicare IM given:    Discharge Disposition:    Per UR Regulation:  Reviewed for med. necessity/level of care/duration of stay  If discussed at [place] of Stay Meetings, dates discussed:    Comments:  03/07/13 [name] RN,BSN NCM [place].ACS/STEMI,TRANSFER TO [facility] IN AM L CATH,?PTCA STENTING.  [id number] Rosana Hoes, RN, BSN, CCM:  CHART REVIEWED AND UPDATED.  Next chart review due on [id number]. NO DISCHARGE NEEDS PRESENT AT THIS TIME. CASE MANAGEMENT [phone]

## 2013-03-07 NOTE — Progress Notes (Signed)
Subjective:  Patient denies any chest pain or shortness of breath. Complains of vague musculoskeletal pain  Objective:  Vital Signs in the last 24 hours: Temp:  [97.9 F (36.6 C)-98.8 F (37.1 C)] 97.9 F (36.6 C) (06/04 1531) Pulse Rate:  [68-80] 68 (06/04 1531) Resp:  [18-20] 20 (06/04 1531) BP: (129-153)/(55-71) 153/68 mmHg (06/04 1531) SpO2:  [92 %-98 %] 92 % (06/04 1531)  Intake/Output from previous day: 06/03 0701 - 06/04 0700 In: 2239.5 [P.O.:240; I.V.:1462; IV Piggyback:537.5] Out: 401 [Urine:400; Stool:1] Intake/Output from this shift: Total I/O In: 1415.8 [P.O.:680; I.V.:485.8; IV Piggyback:250] Out: -   Physical Exam: Neck: no adenopathy, no carotid bruit, no JVD and supple, symmetrical, trachea midline Lungs: Decrease breath sound at bases with occasional rhonchi Heart: regular rate and rhythm, S1, S2 normal, no murmur, click, rub or gallop Abdomen: soft, non-tender; bowel sounds normal; no masses,  no organomegaly Extremities: extremities normal, atraumatic, no cyanosis or edema  Lab Results:  Recent Labs  03/06/13 0400 03/07/13 0511  WBC 10.8* 9.6  HGB 11.8* 11.2*  PLT 98* 97*    Recent Labs  03/06/13 0853 03/07/13 0511  NA 137 136  K 4.3 4.1  CL 107 107  CO2 25 23  GLUCOSE 120* 159*  BUN 13 10  CREATININE 0.78 0.70    Recent Labs  03/05/13 2345 03/06/13 0400  TROPONINI 5.18* 4.54*   Hepatic Function Panel  Recent Labs  03/05/13 0555  PROT 6.4  ALBUMIN 2.4*  AST 74*  ALT 25  ALKPHOS 94  BILITOT 0.5   No results found for this basename: CHOL,  in the last 72 hours No results found for this basename: PROTIME,  in the last 72 hours  Imaging: Imaging results have been reviewed and Dg Chest Port 1 View  03/07/2013   *RADIOLOGY REPORT*  Clinical Data: Follow up pneumonia  PORTABLE CHEST - 1 VIEW  Comparison: Prior chest x-ray 03/05/2013  Findings: Interval removal of left IJ central venous catheter. Significantly improved aeration of  the lungs with decreased interstitial edema and largely resolved right basilar opacity likely reflecting decreased pleural effusion and atelectasis. There is some persistent patchy opacity in the left lung base. Stable cardiomegaly.  Background chronic lung disease persists.  IMPRESSION:  1.  Significantly improved aeration secondary to resolved pulmonary edema, decreased right pleural effusion and right basilar atelectasis. 2. Persistent left basilar patchy opacity consistent with the clinical history of pneumonia 3.  IJ central venous catheter has been removed   Original Report Authenticated By: Jacqulynn Cadet, M.D.    Cardiac Studies:  Assessment/Plan:  Acute non-Q-wave myocardial infarction  Status post acute hypercarbic respiratory failure  Resolving Aspiration pneumonia  Status post Acute renal failure  Hypertension  Depression  Tobacco abuse  History of bronchial asthma  Chronic pain syndrome  Status post opiate overdose  History of hepatitis C  History of pancreatitis in the past  GERD  Plan Discussed with patient at length regarding lethargic cath possible PTCA stenting its risk and benefits i.e. death MI stroke need for emergency CABG local last complications etc. and consents for PCI  LOS: 2 days    Phyllis [name] N 03/07/2013, 5:15 PM

## 2013-03-07 NOTE — Progress Notes (Signed)
ANTICOAGULATION CONSULT NOTE - Follow Up Consult  Pharmacy Consult for Heparin Indication: chest pain/ACS  Allergies  Allergen Reactions  . Meperidine Hcl     hallucinations  . Naproxen     Extreme bruising    Patient Measurements: Height: _0  (165.1 cm) Weight: 160 lb 4.4 oz (72.7 kg) IBW/kg (Calculated) : 57 Heparin Dosing Weight:   Vital Signs: Temp: 98.2 F (36.8 C) (06/04 0509) Temp src: Oral (06/04 0509) BP: 145/71 mmHg (06/04 0509) Pulse Rate: 68 (06/04 0509)  Labs:  Recent Labs  03/05/13 0555 03/05/13 1638 03/05/13 0712  03/05/13 2035 03/05/13 2345 03/06/13 0115 03/06/13 0400 03/06/13 0853 03/06/13 1510 03/06/13 1749 03/06/13 2335 03/07/13 0511  HGB 13.2 16.0*  --   --   --   --   --  11.8*  --   --   --   --  11.2*  HCT 42.6 47.0*  --   --   --   --   --  37.1  --   --   --   --  35.6*  PLT 134*  --   --   --   --   --   --  98*  --   --   --   --  97*  APTT  --   --  32  --   --   --   --   --   --   --   --   --   --   LABPROT  --   --  17.2*  --   --   --   --   --   --   --   --   --   --   INR  --   --  1.44  --   --   --   --   --   --   --   --   --   --   HEPARINUNFRC  --   --   --   --   --   --  0.30  --   --  0.47  --   --  0.51  CREATININE 2.70* 2.50*  --   --   --   --   --   --  0.78  --   --   --  0.70  CKTOTAL  --   --   --   < >  --  4971*  --  4357* 3987*  --  3175* 2302*  --   CKMB  --   --   --   < >  --  35.7*  --  30.8* 26.2*  --  19.8* 15.0*  --   TROPONINI  --   --  2.32*  < > 5.19* 5.18*  --  4.54*  --   --   --   --   --   < > = values in this interval not displayed.  Estimated Creatinine Clearance: 76.6 ml/min (by C-G formula based on Cr of 0.7).   Medications:  Scheduled:  . albuterol  2.5 mg Nebulization Q6H  . antiseptic oral rinse  15 mL Mouth Rinse BID  . aspirin  81 mg Oral Daily  . atorvastatin  80 mg Oral q1800  . clopidogrel  75 mg Oral Q breakfast  . gabapentin  300 mg Oral TID  . levothyroxine  25 mcg  Oral QAC breakfast  . methylPREDNISolone (SOLU-MEDROL) injection  40 mg Intravenous  Q12H  . metoprolol tartrate  12.5 mg Oral BID  . pantoprazole  40 mg Oral Q1200  . piperacillin-tazobactam (ZOSYN)  IV  3.375 g Intravenous Q8H  . vancomycin  1,000 mg Intravenous BID   Infusions:  . sodium chloride 50 mL/hr at 03/06/13 1554  . heparin 950 Units/hr (03/06/13 1555)   PRN: acetaminophen, albuterol, ALPRAZolam, HYDROcodone-acetaminophen, sodium chloride  Assessment: 74 yoF admitted with intentional drug overdose and AMS, started on IV abx for early sepsis/? Aspiration PNA . Pt found to have elevated troponins and EKG with T-wave inversions, has 35 pack year smoking history. Pharmacy consulted to start IV heparin for ACS/STEMI on 6/2  Heparin level at goal  Plts low but stable  No reported bleeding  Goal of Therapy:  Heparin level 0.3-0.7 units/ml Monitor platelets by anticoagulation protocol: Yes   Plan:  1) Continue current rate of 950 units/hr 2) Noted plan per cards note of possible PTCA stenting tomorrow 6/5 3) Daily heparin level and CBC   Adrian Saran, PharmD, BCPS Pager [phone] 03/07/2013 6:18 AM

## 2013-03-07 NOTE — Progress Notes (Signed)
[facility] PROGRESS NOTE  ADIN LARICCIA VZD:[id number] DOB: January 15, 1956 DOA: 03/05/2013 PCP: [name], MD  Assessment/Plan: Principal Problem:   Acute encephalopathy Active Problems:   TOBACCO USE   Acute respiratory failure   Opiate overdose   Aspiration pneumonia   Acute renal failure   NSTEMI (non-ST elevated myocardial infarction)   Asthma with acute exacerbation    1. Acute hypercapnic/hypoxic respiratory failure: Patient presented unresponsive, and in acute hypoxic/hypercapnic respiratory failure, secondary to opiate overdose, aspiration pneumonitis, acute asthma exacerbation, in the setting of tobacco abuse. She was admitted to ICU, managed with supportive treatment, iv Solumedrol, bronchodilators, and oxygen supplementation, in addition to specific measures described below. Fortunately, she responded satisfactorily, and as of 03/06/13, she was saturating at 93%-97% on 2L. Respiratory status has since remained stable.  2. Severe sepsis w/o septic shock: Patient clearly had sepsis at presentation, likely due to aspiration pneumonia and GPC bacteremia (Cagulase neg Staph in 1:2). Fortunately, she did not appear to be in shock. She was managed with iv Vancomycin/Zosyn, and close attension was paid to hemodynamic monitoring. As of 03/07/13, sepsis appears to have resolved clinically.  hemodynamics improved 6/03.  3. NSTEMI: Troponin was found to be elevated, and patient was noted to have ST-T wave changes in anterolateral and inferior leads on 12-lead EKG. Dr Charolette Forward provided cardiology consultation. Patient remained devoid of chest pain or CHF. The feeling is that she probably had demand ischemia from sepsis and hypoxia. Managed with Statin, Plavix, Heparin gtt and ASA per cardiology. Cardiac catheterization is scheduled for 6/45/14.  4. Acute renal failure: Creatinine at presentation was 2.50-2.70, likely pre-renal and ATN from hypoxia/sepsis, against a known baseline  creatinine 0.7 from 12/21/12. Fortunately, this has responded to iv fluids, and as of 03/06/13, creatinine had normalized at 0.78.  5. Thrombocytopenia: This was mild, at 134, likely from sepsis, and has resolved.  6. Hx of hypothyroidism: Continued on Synthroid.   7. Acute metabolic encephalopath: Altered mental status at presentation was due to sepsis, renal failure, pneumonia and respiratory failure. As of 03/06/13, mental status was back to baseline.  8. Chronic pain: Pre-admission Neurontin, Norco, and prn Xanax have been resumed without deleterious effect. Not problematic.    Code Status: Full Code.  Family Communication:  Disposition Plan: To be determined.    Brief narrative: 57 yo female smoker with history of hypertension, depression, GERD, history of hepatitis C, degenerative joint disease, history of hepatitis, tobacco abuse, chronic pain syndrome, found unresponsive from inadvertent Opana overdose. Found in ED to have fever (Tm 101F), respiratory acidosis, elevated lactic acid, aspiration pneumonia and altered mental status. PCCM asked to admit. Clinical course in the ICU was characterized by acute encephalopathy, aspiration with hypoxia/hypercapnia, sespis, noted to have ST-T wave changes in anterolateral and inferior leads and was noted to have elevated troponin I, consistent with NSEMI. Dr Charolette Forward.  provided cardiology consultation. Transferred to [facility] on 03/07/13.     Consultants:  Dr Chesley Mires, PCCM.  Dr Charolette Forward, cardiologist.   Procedures:  Serial CXRs.   Antibiotics:  Vancomycin 03/05/13>>>  Zosyn 03/05/13>>>  HPI/Subjective: No new issues.   Objective: Vital signs in last 24 hours: Temp:  [98.2 F (36.8 C)-98.8 F (37.1 C)] 98.2 F (36.8 C) (06/04 0509) Pulse Rate:  [68-80] 68 (06/04 0509) Resp:  [18-20] 19 (06/04 0509) BP: (129-145)/(55-71) 145/71 mmHg (06/04 0509) SpO2:  [93 %-98 %] 93 % (06/04 0842) Weight change:  Last BM Date:  03/06/13  Intake/Output  from previous day: 06/03 0701 - 06/04 0700 In: 2239.5 [P.O.:240; I.V.:1462; IV Piggyback:537.5] Out: 400 [Urine:400] Total I/O In: 440 [P.O.:440] Out: -    Physical Exam: General: Comfortable, alert, communicative, fully oriented, not short of breath at rest.  HEENT:  Mild clinical pallor, no jaundice, no conjunctival injection or discharge. Hydration is satisfactory.  NECK:  Supple, JVP not seen, no carotid bruits, no palpable lymphadenopathy, no palpable goiter. CHEST:  Clinically clear to auscultation, no wheezes, no crackles. HEART:  Sounds 1 and 2 heard, normal, regular, no murmurs. ABDOMEN:  Full, soft, no scars, non-tender, no palpable organomegaly, no palpable masses, normal bowel sounds. GENITALIA:  Not examined. LOWER EXTREMITIES:  No pitting edema, palpable peripheral pulses. MUSCULOSKELETAL SYSTEM:  Unremarkable. CENTRAL NERVOUS SYSTEM:  No focal neurologic deficit on gross examination.  Lab Results:  Recent Labs  03/06/13 0400 03/07/13 0511  WBC 10.8* 9.6  HGB 11.8* 11.2*  HCT 37.1 35.6*  PLT 98* 97*    Recent Labs  03/06/13 0853 03/07/13 0511  NA 137 136  K 4.3 4.1  CL 107 107  CO2 25 23  GLUCOSE 120* 159*  BUN 13 10  CREATININE 0.78 0.70  CALCIUM 6.6* 6.5*   Recent Results (from the past 240 hour(s))  CULTURE, BLOOD (ROUTINE X 2)     Status: None   Collection Time    03/05/13  5:50 AM      Result Value Range Status   Specimen Description BLOOD RIGHT EXTERNAL JUGULAR   Final   Special Requests NONE   Final   Culture  Setup Time 03/05/2013 10:36   Final   Culture     Final   Value:        BLOOD CULTURE RECEIVED NO GROWTH TO DATE CULTURE WILL BE HELD FOR 5 DAYS BEFORE ISSUING A FINAL NEGATIVE REPORT   Report Status PENDING   Incomplete  CULTURE, BLOOD (ROUTINE X 2)     Status: None   Collection Time    03/05/13  7:12 AM      Result Value Range Status   Specimen Description BLOOD LEFT INTERNAL JUGULAR   Final   Special  Requests BOTTLES DRAWN AEROBIC AND ANAEROBIC [facility] EACH   Final   Culture  Setup Time 03/05/2013 10:36   Final   Culture     Final   Value: STAPHYLOCOCCUS SPECIES (COAGULASE NEGATIVE)     Note: Gram Stain Report Called to,Read Back By and Verified With: TIA JOHNSON 03/06/13  5:34A.Reita Cliche   Report Status PENDING   Incomplete  URINE CULTURE     Status: None   Collection Time    03/05/13  7:57 AM      Result Value Range Status   Specimen Description URINE, RANDOM   Final   Special Requests NONE   Final   Culture  Setup Time 03/05/2013 11:34   Final   Colony Count 45,000 COLONIES/ML   Final   Culture     Final   Value: Multiple bacterial morphotypes present, none predominant. Suggest appropriate recollection if clinically indicated.   Report Status 03/06/2013 FINAL   Final  MRSA PCR SCREENING     Status: None   Collection Time    03/05/13  8:40 AM      Result Value Range Status   MRSA by PCR NEGATIVE  NEGATIVE Final   Comment:            The GeneXpert MRSA Assay (FDA     approved for NASAL  specimens     only), is one component of a     comprehensive MRSA colonization     surveillance program. It is not     intended to diagnose MRSA     infection nor to guide or     monitor treatment for     MRSA infections.  CULTURE, EXPECTORATED SPUTUM-ASSESSMENT     Status: None   Collection Time    03/05/13  1:05 PM      Result Value Range Status   Specimen Description SPUTUM   Final   Special Requests NONE   Final   Sputum evaluation     Final   Value: THIS SPECIMEN IS ACCEPTABLE. RESPIRATORY CULTURE REPORT TO FOLLOW.   Report Status 03/05/2013 FINAL   Final  CULTURE, RESPIRATORY (NON-EXPECTORATED)     Status: None   Collection Time    03/05/13  1:05 PM      Result Value Range Status   Specimen Description SPUTUM   Final   Special Requests NONE   Final   Gram Stain     Final   Value: ABUNDANT WBC PRESENT, PREDOMINANTLY PMN     MODERATE SQUAMOUS EPITHELIAL CELLS PRESENT     FEW GRAM  POSITIVE RODS     FEW GRAM POSITIVE COCCI     IN PAIRS RARE GRAM NEGATIVE RODS   Culture Culture reincubated for better growth   Final   Report Status PENDING   Incomplete     Studies/Results: Dg Chest Port 1 View  03/07/2013   *RADIOLOGY REPORT*  Clinical Data: Follow up pneumonia  PORTABLE CHEST - 1 VIEW  Comparison: Prior chest x-ray 03/05/2013  Findings: Interval removal of left IJ central venous catheter. Significantly improved aeration of the lungs with decreased interstitial edema and largely resolved right basilar opacity likely reflecting decreased pleural effusion and atelectasis. There is some persistent patchy opacity in the left lung base. Stable cardiomegaly.  Background chronic lung disease persists.  IMPRESSION:  1.  Significantly improved aeration secondary to resolved pulmonary edema, decreased right pleural effusion and right basilar atelectasis. 2. Persistent left basilar patchy opacity consistent with the clinical history of pneumonia 3.  IJ central venous catheter has been removed   Original Report Authenticated By: Jacqulynn Cadet, M.D.    Medications: Scheduled Meds: . albuterol  2.5 mg Nebulization Q6H  . antiseptic oral rinse  15 mL Mouth Rinse BID  . aspirin  81 mg Oral Daily  . atorvastatin  80 mg Oral q1800  . clopidogrel  75 mg Oral Q breakfast  . gabapentin  300 mg Oral TID  . levothyroxine  25 mcg Oral QAC breakfast  . methylPREDNISolone (SOLU-MEDROL) injection  40 mg Intravenous Q12H  . metoprolol tartrate  12.5 mg Oral BID  . pantoprazole  40 mg Oral Q1200  . piperacillin-tazobactam (ZOSYN)  IV  3.375 g Intravenous Q8H  . vancomycin  1,000 mg Intravenous BID   Continuous Infusions: . sodium chloride 50 mL/hr at 03/07/13 1212  . heparin 950 Units/hr (03/06/13 1555)   PRN Meds:.acetaminophen, albuterol, ALPRAZolam, HYDROcodone-acetaminophen, sodium chloride    LOS: 2 days   Carolyna Yerian,CHRISTOPHER  [facility] Pager [phone]. If 8PM-8AM, please  contact night-coverage at [email or web address], password [facility] 03/07/2013, 2:21 PM  LOS: 2 days

## 2013-03-08 ENCOUNTER — Encounter (HOSPITAL_COMMUNITY)
Admission: EM | Disposition: A | Discharge status: Home / Self Care | Payer: Self-pay | Source: Home / Self Care | Attending: Pulmonary Disease

## 2013-03-08 DIAGNOSIS — G9340 Encephalopathy, unspecified: Secondary | ICD-10-CM

## 2013-03-08 DIAGNOSIS — J9600 Acute respiratory failure, unspecified whether with hypoxia or hypercapnia: Secondary | ICD-10-CM

## 2013-03-08 DIAGNOSIS — N179 Acute kidney failure, unspecified: Secondary | ICD-10-CM

## 2013-03-08 DIAGNOSIS — J45901 Unspecified asthma with (acute) exacerbation: Secondary | ICD-10-CM

## 2013-03-08 DIAGNOSIS — T400X1A Poisoning by opium, accidental (unintentional), initial encounter: Secondary | ICD-10-CM

## 2013-03-08 DIAGNOSIS — J690 Pneumonitis due to inhalation of food and vomit: Secondary | ICD-10-CM

## 2013-03-08 HISTORY — PX: LEFT HEART CATHETERIZATION WITH CORONARY ANGIOGRAM: SHX5451

## 2013-03-08 LAB — CBC
HCT: 37.4 % (ref 36.0–46.0)
Hemoglobin: 11.8 g/dL — ABNORMAL LOW (ref 12.0–15.0)
MCH: 30.3 pg (ref 26.0–34.0)
MCV: 95.9 fL (ref 78.0–100.0)
RBC: 3.9 MIL/uL (ref 3.87–5.11)
WBC: 10 10*3/uL (ref 4.0–10.5)

## 2013-03-08 LAB — CULTURE, BLOOD (ROUTINE X 2)

## 2013-03-08 LAB — BASIC METABOLIC PANEL
CO2: 21 mEq/L (ref 19–32)
Calcium: 6.9 mg/dL — ABNORMAL LOW (ref 8.4–10.5)
Chloride: 105 mEq/L (ref 96–112)
Creatinine, Ser: 0.64 mg/dL (ref 0.50–1.10)
Glucose, Bld: 176 mg/dL — ABNORMAL HIGH (ref 70–99)

## 2013-03-08 SURGERY — LEFT HEART CATHETERIZATION WITH CORONARY ANGIOGRAM
Anesthesia: LOCAL

## 2013-03-08 MED ORDER — PREDNISONE 20 MG PO TABS
40.0000 mg | ORAL_TABLET | Freq: Every day | ORAL | Status: DC
  Filled 2013-03-08: qty 2

## 2013-03-08 MED ORDER — HYDROMORPHONE HCL PF 2 MG/ML IJ SOLN
INTRAMUSCULAR | Status: AC
  Filled 2013-03-08: qty 1

## 2013-03-08 MED ORDER — SODIUM CHLORIDE 0.9 % IV SOLN: INTRAVENOUS | Status: AC

## 2013-03-08 MED ORDER — PREDNISONE 20 MG PO TABS: 20.0000 mg | ORAL_TABLET | Freq: Every day | ORAL | Status: DC

## 2013-03-08 MED ORDER — METOPROLOL TARTRATE 12.5 MG HALF TABLET: 12.5000 mg | ORAL_TABLET | Freq: Two times a day (BID) | ORAL | Status: DC

## 2013-03-08 MED ORDER — AMOXICILLIN-POT CLAVULANATE 875-125 MG PO TABS: 1.0000 | ORAL_TABLET | Freq: Two times a day (BID) | ORAL | Status: DC

## 2013-03-08 MED ORDER — MIDAZOLAM HCL 5 MG/5ML IJ SOLN
INTRAMUSCULAR | Status: AC
  Filled 2013-03-08: qty 5

## 2013-03-08 MED ORDER — LIDOCAINE HCL (PF) 1 % IJ SOLN
INTRAMUSCULAR | Status: AC
  Filled 2013-03-08: qty 30

## 2013-03-08 MED ORDER — ONDANSETRON HCL 4 MG/2ML IJ SOLN: 4.0000 mg | Freq: Four times a day (QID) | INTRAMUSCULAR | Status: DC | PRN

## 2013-03-08 MED ORDER — ACETAMINOPHEN 325 MG PO TABS: 650.0000 mg | ORAL_TABLET | ORAL | Status: DC | PRN

## 2013-03-08 MED ORDER — HYDROCODONE-ACETAMINOPHEN 10-325 MG PO TABS: 1.0000 | ORAL_TABLET | Freq: Four times a day (QID) | ORAL | Status: DC | PRN

## 2013-03-08 MED ORDER — ASPIRIN 81 MG PO CHEW: 81.0000 mg | CHEWABLE_TABLET | Freq: Every day | ORAL | Status: DC

## 2013-03-08 MED ORDER — ATORVASTATIN CALCIUM 40 MG PO TABS: 40.0000 mg | ORAL_TABLET | Freq: Every day | ORAL | Status: DC

## 2013-03-08 MED ORDER — AMOXICILLIN-POT CLAVULANATE 875-125 MG PO TABS
1.0000 | ORAL_TABLET | Freq: Two times a day (BID) | ORAL | Status: DC
  Administered 2013-03-08: 14:00:00 1 via ORAL
  Filled 2013-03-08 (×2): qty 1

## 2013-03-08 MED ORDER — HEPARIN (PORCINE) IN NACL 2-0.9 UNIT/ML-% IJ SOLN
INTRAMUSCULAR | Status: AC
  Filled 2013-03-08: qty 1000

## 2013-03-08 MED ORDER — HYDROMORPHONE HCL PF 10 MG/ML IJ SOLN
1.0000 mg | Freq: Once | INTRAMUSCULAR | Status: AC
  Administered 2013-03-08: 1 mg via INTRAVENOUS

## 2013-03-08 MED ORDER — FENTANYL CITRATE 0.05 MG/ML IJ SOLN
INTRAMUSCULAR | Status: AC
  Filled 2013-03-08: qty 2

## 2013-03-08 NOTE — Progress Notes (Signed)
Utilization Review Completed. Bari Leib J. Clydene Laming, [place], [place], [employer] [phone].

## 2013-03-08 NOTE — Progress Notes (Signed)
ANTICOAGULATION CONSULT NOTE - Follow Up Consult  Pharmacy Consult for Heparin Indication: chest pain/ACS  Allergies  Allergen Reactions  . Meperidine Hcl     hallucinations  . Naproxen     Extreme bruising    Patient Measurements: Height: _0  (165.1 cm) Weight: 169 lb 8 oz (76.885 kg) IBW/kg (Calculated) : 57 Heparin Dosing Weight:   Vital Signs: Temp: 97.8 F (36.6 C) (06/05 0436) Temp src: Oral (06/05 0436) BP: 147/68 mmHg (06/05 0436) Pulse Rate: 64 (06/05 0436)  Labs:  Recent Labs  03/05/13 2035 03/05/13 2345  03/06/13 0400 03/06/13 0853 03/06/13 1510 03/06/13 1749 03/06/13 2335 03/07/13 0511 03/08/13 0540  HGB  --   --   < > 11.8*  --   --   --   --  11.2* 11.8*  HCT  --   --   --  37.1  --   --   --   --  35.6* 37.4  PLT  --   --   --  98*  --   --   --   --  97* 98*  HEPARINUNFRC  --   --   < >  --   --  0.47  --   --  0.51 0.61  CREATININE  --   --   --   --  0.78  --   --   --  0.70 0.64  CKTOTAL  --  4971*  --  4357* 3987*  --  3175* 2302*  --   --   CKMB  --  35.7*  --  30.8* 26.2*  --  19.8* 15.0*  --   --   TROPONINI 5.19* 5.18*  --  4.54*  --   --   --   --   --   --   < > = values in this interval not displayed.  Estimated Creatinine Clearance: 78.7 ml/min (by C-G formula based on Cr of 0.64).   Medications:  Scheduled:  . albuterol  2.5 mg Nebulization Q6H  . antiseptic oral rinse  15 mL Mouth Rinse BID  . aspirin  81 mg Oral Daily  . atorvastatin  80 mg Oral q1800  . clopidogrel  75 mg Oral Q breakfast  . gabapentin  300 mg Oral TID  . levothyroxine  25 mcg Oral QAC breakfast  . methylPREDNISolone (SOLU-MEDROL) injection  40 mg Intravenous Q12H  . metoprolol tartrate  12.5 mg Oral BID  . pantoprazole  40 mg Oral Q1200  . piperacillin-tazobactam (ZOSYN)  IV  3.375 g Intravenous Q8H  . sodium chloride  3 mL Intravenous Q12H  . vancomycin  1,000 mg Intravenous BID   Infusions:  . sodium chloride 50 mL/hr at 03/07/13 1212  . sodium  chloride 1 mL/kg/hr (03/07/13 2000)  . heparin 950 Units/hr (03/07/13 1712)   PRN: sodium chloride, acetaminophen, albuterol, ALPRAZolam, HYDROcodone-acetaminophen, sodium chloride, sodium chloride  Assessment: 2 yoF admitted with opioid drug overdose and AMS, started on IV abx for early sepsis/? Aspiration PNA . Pt found to have elevated troponins and EKG with T-wave inversions, has 35 pack year smoking history. Pharmacy consulted to start IV heparin for ACS/STEMI. Pt received a dose of SQ heparin 5000 units at 1334, therefore not given bolus IV heparin.   Heparin level this am = 0.61 on heparin 950 units/hr  CBC: Hgb = 11.8 (improved), Platelets = 98 (stable, low at presentation)  No bleeding reported  Patient to go to [facility] for cath at ~  9am  Goal of Therapy:  Heparin level 0.3-0.7 units/ml Monitor platelets by anticoagulation protocol: Yes   Plan:   Continue Heparin at present rate of 950 units/hour.  Daily Heparin levels/CBC  Follow-up after cath re: heparin plans  Doreene Eland, PharmD, BCPS.   Pager: [phone] 03/08/2013,7:46 AM

## 2013-03-08 NOTE — H&P (Signed)
  No changes in the H&P

## 2013-03-08 NOTE — Cardiovascular Report (Signed)
Phyllis [name], Phyllis [name] NO.:  [id number]  MEDICAL RECORD NO.:  [id number]  LOCATION:  6526                         FACILITY:  [place]  PHYSICIAN:  Allegra Lai. Terrence Dupont, M.D. DATE OF BIRTH:  06/13/1956  DATE OF PROCEDURE:  03/08/2013 DATE OF DISCHARGE:  03/08/2013                           CARDIAC CATHETERIZATION   PROCEDURES: 1. Left cardiac catheterization with selective left and right coronary     angiography, left ventriculography via right groin using Judkins     technique. 2. Successful closure of arteriotomy using Perclose.  INDICATION FOR THE PROCEDURE:  Phyllis [name] is a 57 year old female with past medical history significant for hypertension, depression, GERD, history of hepatitis, degenerative joint disease, history of tobacco abuse, chronic pain syndrome, was admitted because of unresponsiveness following opiate overdose.  The patient complained of chronic leg pain. States took Opana and became responsive and was brought to the ED by her husband.  Cardiology consultation was obtained as the patient was noted to have ST-T wave changes in anterolateral and inferior leads and was noted to have elevated troponin-I.  Troponin-I first set was 2.32.  Next set was 5.19, next set 5.44, 5.19 and CK also were elevated 5441, MB of 45.2.  The patient was treated for hypercarbic respiratory failure and aspiration pneumonia.  Due to EKG changes and elevated cardiac enzymes, discussed with the patient regarding left cath, possible PTCA stenting, its risks and benefits, i.e., death, MI, stroke, need for emergency CABG, local vascular complications, etc. and consented for PCI.  PROCEDURE:  After obtaining the informed consent, the patient was brought to the Cath Lab and was placed on fluoroscopy table.  Right groin was prepped and draped in usual fashion.  Xylocaine 1% was used for local anesthesia in the right groin.  With the help of thin wall needle, 6-French arterial  sheath was placed.  The sheath was aspirated and flushed.  Next, 6-French left Judkins catheter was advanced over the wire under fluoroscopic guidance up to the ascending aorta.  Wire was pulled out, the catheter was aspirated and connected to the Manifold. Catheter was further advanced and engaged into left coronary ostium. Multiple views of the left system were taken.  Next, the catheter was disengaged and was pulled out over the wire and was replaced with 6- [place] right Judkins catheter, which was advanced over the wire under fluoroscopic guidance up to the ascending aorta.  Wire was pulled out, the catheter was aspirated and connected to the Manifold.  Catheter was further advanced and engaged into the right coronary ostium.  Multiple views of the right system were taken.  Next, the catheter was disengaged and was pulled out over the wire and was replaced with 6-French pigtail catheter, which was advanced over the wire under fluoroscopic guidance up to the ascending aorta.  Wire was pulled out, the catheter was aspirated and connected to the Manifold.  Catheter was further advanced across the aortic valve into the LV.  LV pressures were recorded.  Next, left ventriculography  was done in 30-degree RAO position.  Post- angiographic pressures were recorded from LV and then pullback pressures were recorded from aorta.  There was no gradient across the aortic valve.  Next, the pigtail catheter was pulled out over the wire. Sheaths were aspirated and flushed.  Next, arteriotomy was closed using Perclose with good hemostasis.  FINDINGS:  LV showed good LV systolic function.  Left main was patent. LAD has 15-20% mid-stenosis, which appears to be intramyocardial, but there was no intra myocardial in the midportion, but there was no critical stenosis in mid LAD.  Diagonal 1 is small, which is patent. Left circumflex has 10-15% mid-stenosis.  OM 1 and OM 2 were very small.  OM 3 was moderate  size, which was patent.  OM 4 was small, which was patent.  RCA has 10-15% mid-stenosis.  The patient has codominant coronary system.  The patient tolerated the procedure well. There were no complications.  The patient was transferred to the recovery room in stable condition.     Allegra Lai. Terrence Dupont, M.D.     MNH/MEDQ  D:  03/08/2013  T:  03/08/2013  Job:  [id number]

## 2013-03-08 NOTE — CV Procedure (Signed)
Cath report dictated on 03/08/2013 dictation number is [phone]

## 2013-03-08 NOTE — Progress Notes (Signed)
Subjective:  Patient denies any chest pain or shortness of breath. Tolerated left cardiac cath today noted to have mild coronary artery disease with mid LAD intramyocardial but no critical stenosis even with systole. With good LV systolic function  Objective:  Vital Signs in the last 24 hours: Temp:  [97.8 F (36.6 C)-98.3 F (36.8 C)] 97.8 F (36.6 C) (06/05 0436) Pulse Rate:  [64-78] 64 (06/05 0436) Resp:  [18-20] 19 (06/05 0436) BP: (147-153)/(61-68) 147/68 mmHg (06/05 0436) SpO2:  [91 %-93 %] 93 % (06/05 0740) Weight:  [76.885 kg (169 lb 8 oz)] 76.885 kg (169 lb 8 oz) (06/05 0647)  Intake/Output from previous day: 06/04 0701 - 06/05 0700 In: 2753.1 [P.O.:680; I.V.:1523.1; IV Piggyback:550] Out: 1200 [Urine:1200] Intake/Output from this shift:    Physical Exam: Neck: no adenopathy, no carotid bruit, no JVD and supple, symmetrical, trachea midline Lungs: Decreased breath sounds with occasional rhonchi Heart: regular rate and rhythm, S1, S2 normal, no murmur, click, rub or gallop Abdomen: soft, non-tender; bowel sounds normal; no masses,  no organomegaly Extremities: extremities normal, atraumatic, no cyanosis or edema and Right groin stable  Lab Results:  Recent Labs  03/07/13 0511 03/08/13 0540  WBC 9.6 10.0  HGB 11.2* 11.8*  PLT 97* 98*    Recent Labs  03/07/13 0511 03/08/13 0540  NA 136 137  K 4.1 3.5  CL 107 105  CO2 23 21  GLUCOSE 159* 176*  BUN 10 5*  CREATININE 0.70 0.64    Recent Labs  03/05/13 2345 03/06/13 0400  TROPONINI 5.18* 4.54*   Hepatic Function Panel No results found for this basename: PROT, ALBUMIN, AST, ALT, ALKPHOS, BILITOT, BILIDIR, IBILI,  in the last 72 hours No results found for this basename: CHOL,  in the last 72 hours No results found for this basename: PROTIME,  in the last 72 hours  Imaging: Imaging results have been reviewed and Dg Chest Port 1 View  03/07/2013   *RADIOLOGY REPORT*  Clinical Data: Follow up pneumonia   PORTABLE CHEST - 1 VIEW  Comparison: Prior chest x-ray 03/05/2013  Findings: Interval removal of left IJ central venous catheter. Significantly improved aeration of the lungs with decreased interstitial edema and largely resolved right basilar opacity likely reflecting decreased pleural effusion and atelectasis. There is some persistent patchy opacity in the left lung base. Stable cardiomegaly.  Background chronic lung disease persists.  IMPRESSION:  1.  Significantly improved aeration secondary to resolved pulmonary edema, decreased right pleural effusion and right basilar atelectasis. 2. Persistent left basilar patchy opacity consistent with the clinical history of pneumonia 3.  IJ central venous catheter has been removed   Original Report Authenticated By: Jacqulynn Cadet, M.D.    Cardiac Studies:  Assessment/Plan:  Acute non-Q-wave myocardial infarction  Status post left cath with mild CAD with mid LAD intra- myocardial but no significant bridging/stenosis Status post acute hypercarbic respiratory failure  Resolving Aspiration pneumonia  Status post Acute renal failure  Hypertension  Depression  Tobacco abuse  History of bronchial asthma  Chronic pain syndrome  Status post opiate overdose  History of hepatitis C  History of pancreatitis in the past  GERD  Plan Continue present management Okay to discharge from cardiac point of view Followup with me in 2 weeks  LOS: 3 days    Lachlan Mckim N 03/08/2013, 10:25 AM

## 2013-03-08 NOTE — Progress Notes (Signed)
Dr. Marye Round notified that patients last dose of steriod was last night, instructed to have patient start prednisone tomorrow as ordered. Also notified that patients SBP 175, instructed to continue with metoprolol 12.5 mg as ordered.

## 2013-03-08 NOTE — Discharge Summary (Signed)
Physician Discharge Summary  WANETTA FUNDERBURKE HDQ:[id number] DOB: [birth year] DOA: 03/05/2013  PCP: [name], MD  Admit date: 03/05/2013 Discharge date: 03/08/2013  Time spent: 40 minutes    Discharge Diagnoses:  Acute encephalopathy - resolved  NSTEMI (non-ST elevated myocardial infarction) - type II - related to respiratory failure - cardiac cath without significant CAD.    TOBACCO USE   Acute respiratory failure - resolved    Opiate overdose - accidental    Aspiration pneumonia - resolved    Acute renal failure - resolved      Asthma with acute exacerbation - improved  Severe sepsis - resolved   Discharge Condition: good  Diet recommendation: heart healthy   Filed Weights   03/05/13 1000 03/08/13 0647  Weight: 72.7 kg (160 lb 4.4 oz) 76.885 kg (169 lb 8 oz)    History of present illness:  57 yo female smoker found unresponsive from opana overdose. Found in ED to have fever (Tm 101F), respiratory acidosis, elevated lactic acid, aspiration pneumonia and altered mental status. PCCM asked to admit.    Hospital Course:  1. Acute hypercapnic/hypoxic respiratory failure: Patient presented unresponsive, and in acute hypoxic/hypercapnic respiratory failure, secondary to opiate overdose, aspiration pneumonitis, acute asthma exacerbation, in the setting of tobacco abuse. She was admitted to ICU, managed with supportive treatment, iv Solumedrol, bronchodilators, and oxygen supplementation, in addition to specific measures described below. Fortunately, she responded satisfactorily, and as of 03/06/13, she was saturating at 93%-97% on 2L. Respiratory status has since remained stable, and on 03/08/13, she was saturating at 91%-93% on RA. Respiratory failure has resolved. Have transitioned to oral steroid taper, as of 03/08/13. Patient has beebn on iv vancomycin/Zosyn since 03/05/13. Have changed antibiotics to oral Augumentin today, for another 3 days, to be concluded on 03/11/13.  2. Severe sepsis w/o  septic shock: Patient clearly had sepsis at presentation, likely due to aspiration pneumonia and GPC bacteremia (Cagulase neg Staph in 1:2). Fortunately, she did not appear to be in shock. She was managed with iv Vancomycin/Zosyn, and close attension was paid to hemodynamic monitoring. As of 03/07/13, sepsis appears to have resolved clinically.  3. NSTEMI: Troponin was found to be elevated, and patient was noted to have ST-T wave changes in anterolateral and inferior leads on 12-lead EKG. Dr Charolette Forward provided cardiology consultation. Patient remained devoid of chest pain or CHF. The feeling is that she probably had demand ischemia from sepsis and hypoxia. Managed with Statin, Plavix, Heparin gtt and ASA per cardiology. Cardiac catheterization was done on 03/08/13 by Dr. Terrence Dupont.  Patient  has good LV function, no critical lesions, and recommendations are for medical management only.  4. Acute renal failure: Creatinine at presentation was 2.50-2.70, likely pre-renal and ATN from hypoxia/sepsis, against a known baseline creatinine 0.7 from 12/21/12. Fortunately, this has responded to iv fluids, and as of 03/06/13, creatinine had normalized at 0.78.  5. Thrombocytopenia: This was mild, at 134, likely from sepsis, and has resolved.  6. Hx of hypothyroidism: Continued on Synthroid.  7. Acute metabolic encephalopath: Altered mental status at presentation was due to sepsis, renal failure, pneumonia and respiratory failure. As of 03/06/13, mental status was back to baseline.  8. Chronic pain: Pre-admission Neurontin, Norco, and prn Xanax have been resumed without deleterious effect.      Procedures: Cardiac catheterization 6/5  Consultations:  PCCM  Cardiology - [facility]   Discharge Exam: Filed Vitals:   03/08/13 1194 03/08/13 1740 03/08/13 0740 03/08/13 1206  BP: 147/68  171/86  Pulse: 64   65  Temp: 97.8 F (36.6 C)   98 F (36.7 C)  TempSrc: Oral   Oral  Resp: 19   20  Height:      Weight:   76.885 kg (169 lb 8 oz)    SpO2: 91%  93% 95%    General: axox3 Cardiovascular: rrr Respiratory: ctab  Discharge Instructions  Discharge Orders   Future Appointments Provider Department Dept Phone   04/25/2013 1:45 PM [name], MD [facility] [phone]   Future Orders Complete By Expires     Diet - low sodium heart healthy  As directed     Increase activity slowly  As directed         Medication List    TAKE these medications       ALPRAZolam 0.5 MG tablet  Commonly known as:  XANAX  Take 0.5 mg by mouth 3 (three) times daily as needed for anxiety.     amoxicillin-clavulanate 875-125 MG per tablet  Commonly known as:  AUGMENTIN  Take 1 tablet by mouth every 12 (twelve) hours.     aspirin 81 MG chewable tablet  Chew 1 tablet (81 mg total) by mouth daily.     atorvastatin 40 MG tablet  Commonly known as:  LIPITOR  Take 1 tablet (40 mg total) by mouth daily at 6 PM.     gabapentin 300 MG capsule  Commonly known as:  NEURONTIN  Take 1 capsule (300 mg total) by mouth 3 (three) times daily.     HYDROcodone-acetaminophen 10-325 MG per tablet  Commonly known as:  NORCO  Take 1 tablet by mouth every 6 (six) hours as needed for pain.     metoprolol tartrate 12.5 mg Tabs  Commonly known as:  LOPRESSOR  Take 0.5 tablets (12.5 mg total) by mouth 2 (two) times daily.     predniSONE 20 MG tablet  Commonly known as:  DELTASONE  Take 1 tablet (20 mg total) by mouth daily.  Start taking on:  03/09/2013     SYNTHROID 25 MCG tablet  Generic drug:  levothyroxine  Take 25 mcg by mouth daily.       Allergies  Allergen Reactions  . Meperidine Hcl     hallucinations  . Naproxen     Extreme bruising       Follow-up Information   Schedule an appointment as soon as possible for a visit with [name], MD.   Contact information:   [place] [place] [address] [phone]       Schedule an  appointment as soon as possible for a visit with Clent Demark, MD.   Contact information:   [address] [place] [address] [phone]        The results of significant diagnostics from this hospitalization (including imaging, microbiology, ancillary and laboratory) are listed below for reference.    Significant Diagnostic Studies: Dg Chest Port 1 View  03/07/2013   *RADIOLOGY REPORT*  Clinical Data: Follow up pneumonia  PORTABLE CHEST - 1 VIEW  Comparison: Prior chest x-ray 03/05/2013  Findings: Interval removal of left IJ central venous catheter. Significantly improved aeration of the lungs with decreased interstitial edema and largely resolved right basilar opacity likely reflecting decreased pleural effusion and atelectasis. There is some persistent patchy opacity in the left lung base. Stable cardiomegaly.  Background chronic lung disease persists.  IMPRESSION:  1.  Significantly improved aeration secondary to resolved pulmonary edema, decreased right pleural effusion and right basilar atelectasis. 2. Persistent left basilar patchy opacity consistent with the clinical history of pneumonia 3.  IJ central venous catheter has been removed   Original Report Authenticated By: Jacqulynn Cadet, M.D.   Dg Chest Port 1 View  03/05/2013   *RADIOLOGY REPORT*  Clinical Data: Line placement.  PORTABLE CHEST - 1 VIEW  Comparison: Chest 03/05/2013 at two 02/05/1979.  Findings: The patient has a new left IJ approach central venous catheter with the tip projecting over the mid to lower superior vena cava.  No pneumothorax is identified.  There has been marked worsening of aeration in the right lung base. There is interstitial edema.  IMPRESSION:  1.  Right IJ catheter tip projects over the superior vena cava.  No pneumothorax. 2.  Marked worsening in right basilar aeration could be due to aspiration or atelectasis. 3.  Interstitial edema.   Original Report Authenticated By: Orlean Patten, M.D.   Dg Chest Port 1 View  03/05/2013   *RADIOLOGY REPORT*  Clinical Data: Altered mental status; fever.  Apparent overdose.  PORTABLE CHEST - 1 VIEW  Comparison: Chest radiograph performed 03/20/2012  Findings: The lungs are well-aerated.  Mild bibasilar airspace opacities may reflect mild pneumonia or possibly mildly asymmetric interstitial edema.  Mild vascular congestion is noted.  No definite pleural effusion or pneumothorax is seen.  The cardiomediastinal silhouette is borderline normal in size; calcification is noted within the aortic arch.  No acute osseous abnormalities are seen.  IMPRESSION: Mild bibasilar airspace opacities may reflect mild pneumonia or possibly mildly asymmetric interstitial edema, right greater than left.  Mild vascular congestion noted.   Original Report Authenticated By: Santa Lighter, M.D.    Microbiology: Recent Results (from the past 240 hour(s))  CULTURE, BLOOD (ROUTINE X 2)     Status: None   Collection Time    03/05/13  5:50 AM      Result Value Range Status   Specimen Description BLOOD RIGHT EXTERNAL JUGULAR   Final   Special Requests NONE   Final   Culture  Setup Time 03/05/2013 10:36   Final   Culture     Final   Value:        BLOOD CULTURE RECEIVED NO GROWTH TO DATE CULTURE WILL BE HELD FOR 5 DAYS BEFORE ISSUING A FINAL NEGATIVE REPORT   Report Status PENDING   Incomplete  CULTURE, BLOOD (ROUTINE X 2)     Status: None   Collection Time    03/05/13  7:12 AM      Result Value Range Status   Specimen Description BLOOD LEFT INTERNAL JUGULAR   Final   Special Requests BOTTLES DRAWN AEROBIC AND ANAEROBIC 3CC EACH   Final   Culture  Setup Time 03/05/2013 10:36   Final   Culture     Final   Value: STAPHYLOCOCCUS SPECIES (COAGULASE NEGATIVE)     Note: THE SIGNIFICANCE OF ISOLATING THIS ORGANISM FROM A SINGLE SET OF BLOOD CULTURES WHEN MULTIPLE SETS ARE DRAWN IS UNCERTAIN. PLEASE NOTIFY THE MICROBIOLOGY DEPARTMENT WITHIN ONE WEEK IF SPECIATION  AND SENSITIVITIES ARE REQUIRED.     Note: Gram Stain Report Called to,Read Back By and Verified With: TIA JOHNSON 03/06/13  5:34A.Reita Cliche   Report Status 03/08/2013 FINAL   Final  URINE CULTURE     Status: None   Collection Time    03/05/13  7:57 AM      Result Value Range  Status   Specimen Description URINE, RANDOM   Final   Special Requests NONE   Final   Culture  Setup Time 03/05/2013 11:34   Final   Colony Count 45,000 COLONIES/ML   Final   Culture     Final   Value: Multiple bacterial morphotypes present, none predominant. Suggest appropriate recollection if clinically indicated.   Report Status 03/06/2013 FINAL   Final  MRSA PCR SCREENING     Status: None   Collection Time    03/05/13  8:40 AM      Result Value Range Status   MRSA by PCR NEGATIVE  NEGATIVE Final   Comment:            The GeneXpert MRSA Assay (FDA     approved for NASAL specimens     only), is one component of a     comprehensive MRSA colonization     surveillance program. It is not     intended to diagnose MRSA     infection nor to guide or     monitor treatment for     MRSA infections.  CULTURE, EXPECTORATED SPUTUM-ASSESSMENT     Status: None   Collection Time    03/05/13  1:05 PM      Result Value Range Status   Specimen Description SPUTUM   Final   Special Requests NONE   Final   Sputum evaluation     Final   Value: THIS SPECIMEN IS ACCEPTABLE. RESPIRATORY CULTURE REPORT TO FOLLOW.   Report Status 03/05/2013 FINAL   Final  CULTURE, RESPIRATORY (NON-EXPECTORATED)     Status: None   Collection Time    03/05/13  1:05 PM      Result Value Range Status   Specimen Description SPUTUM   Final   Special Requests NONE   Final   Gram Stain     Final   Value: ABUNDANT WBC PRESENT, PREDOMINANTLY PMN     MODERATE SQUAMOUS EPITHELIAL CELLS PRESENT     FEW GRAM POSITIVE RODS     FEW GRAM POSITIVE COCCI     IN PAIRS RARE GRAM NEGATIVE RODS   Culture     Final   Value: MODERATE STAPHYLOCOCCUS AUREUS      Note: RIFAMPIN AND GENTAMICIN SHOULD NOT BE USED AS SINGLE DRUGS FOR TREATMENT OF STAPH INFECTIONS.   Report Status PENDING   Incomplete     Labs: Basic Metabolic Panel:  Recent Labs Lab 03/05/13 0555 03/05/13 0611 03/06/13 0853 03/07/13 0511 03/08/13 0540  NA 136 139 137 136 137  K 4.9 4.8 4.3 4.1 3.5  CL 103 109 107 107 105  CO2 18*  --  _0 GLUCOSE 141* 137* 120* 159* 176*  BUN _1 5*  CREATININE 2.70* 2.50* 0.78 0.70 0.64  CALCIUM 6.7*  --  6.6* 6.5* 6.9*   Liver Function Tests:  Recent Labs Lab 03/05/13 0555  AST 74*  ALT 25  ALKPHOS 94  BILITOT 0.5  PROT 6.4  ALBUMIN 2.4*   No results found for this basename: LIPASE, AMYLASE,  in the last 168 hours No results found for this basename: AMMONIA,  in the last 168 hours CBC:  Recent Labs Lab 03/05/13 0555 03/05/13 0611 03/06/13 0400 03/07/13 0511 03/08/13 0540  WBC 14.6*  --  10.8* 9.6 10.0  NEUTROABS 10.4*  --   --   --   --   HGB 13.2 16.0* 11.8* 11.2* 11.8*  HCT 42.6 47.0* 37.1  35.6* 37.4  MCV 102.2*  --  98.1 96.2 95.9  PLT 134*  --  98* 97* 98*   Cardiac Enzymes:  Recent Labs Lab 03/05/13 1315  03/05/13 1825 03/05/13 2035 03/05/13 2345 03/06/13 0400 03/06/13 0853 03/06/13 1749 03/06/13 2335  CKTOTAL  --   < > 5441*  --  4971* 4357* 3987* 3175* 2302*  CKMB  --   < > 45.2*  --  35.7* 30.8* 26.2* 19.8* 15.0*  TROPONINI 5.19*  --  5.44* 5.19* 5.18* 4.54*  --   --   --   < > = values in this interval not displayed. BNP: BNP (last 3 results)  Recent Labs  04/14/12 1255  PROBNP 7.0   CBG:  Recent Labs Lab 03/05/13 1552 03/05/13 1947 03/05/13 2347 03/06/13 0454 03/06/13 0752  GLUCAP 137* 127* 73 97 107*       Signed:  Jaileigh Weimer  [facility] 03/08/2013, 12:56 PM

## 2013-03-08 NOTE — Progress Notes (Signed)
[facility] PROGRESS NOTE  Phyllis [name] DUK:[id number] DOB: October 07, 1955 DOA: 03/05/2013 PCP: [name], MD  Assessment/Plan: Principal Problem:   Acute encephalopathy Active Problems:   TOBACCO USE   Acute respiratory failure   Opiate overdose   Aspiration pneumonia   Acute renal failure   NSTEMI (non-ST elevated myocardial infarction)   Asthma with acute exacerbation    1. Acute hypercapnic/hypoxic respiratory failure: Patient presented unresponsive, and in acute hypoxic/hypercapnic respiratory failure, secondary to opiate overdose, aspiration pneumonitis, acute asthma exacerbation, in the setting of tobacco abuse. She was admitted to ICU, managed with supportive treatment, iv Solumedrol, bronchodilators, and oxygen supplementation, in addition to specific measures described below. Fortunately, she responded satisfactorily, and as of 03/06/13, she was saturating at 93%-97% on 2L. Respiratory status has since remained stable, and on 03/08/13, she was saturating at 91%-93% on RA. Respiratory failure has resolved. Have transitioned to oral steroid taper, as of 03/08/13. Patient has beebn on iv vancomycin/Zosyn since 03/05/13. Have changed antibiotics to oral Augumentin today, for another 4 days, to be concluded on 03/11/13.  2. Severe sepsis w/o septic shock: Patient clearly had sepsis at presentation, likely due to aspiration pneumonia and GPC bacteremia (Cagulase neg Staph in 1:2). Fortunately, she did not appear to be in shock. She was managed with iv Vancomycin/Zosyn, and close attension was paid to hemodynamic monitoring. As of 03/07/13, sepsis appears to have resolved clinically.  3. NSTEMI: Troponin was found to be elevated, and patient was noted to have ST-T wave changes in anterolateral and inferior leads on 12-lead EKG. Dr Charolette Forward provided cardiology consultation. Patient remained devoid of chest pain or CHF. The feeling is that she probably had demand ischemia from sepsis and  hypoxia. Managed with Statin, Plavix, Heparin gtt and ASA per cardiology. Cardiac catheterization was done on 03/08/13. Have discussed findings with Dr Terrence Dupont. Patient apparently has good LV function, no critical lesions, and medical management only, is recommended.  4. Acute renal failure: Creatinine at presentation was 2.50-2.70, likely pre-renal and ATN from hypoxia/sepsis, against a known baseline creatinine 0.7 from 12/21/12. Fortunately, this has responded to iv fluids, and as of 03/06/13, creatinine had normalized at 0.78.  5. Thrombocytopenia: This was mild, at 134, likely from sepsis, and has resolved.  6. Hx of hypothyroidism: Continued on Synthroid.   7. Acute metabolic encephalopath: Altered mental status at presentation was due to sepsis, renal failure, pneumonia and respiratory failure. As of 03/06/13, mental status was back to baseline.  8. Chronic pain: Pre-admission Neurontin, Norco, and prn Xanax have been resumed without deleterious effect. Not problematic.    Code Status: Full Code.  Family Communication:  Disposition Plan: To be determined.    Brief narrative: 57 yo female smoker with history of hypertension, depression, GERD, history of hepatitis C, degenerative joint disease, history of hepatitis, tobacco abuse, chronic pain syndrome, found unresponsive from inadvertent Opana overdose. Found in ED to have fever (Tm 101F), respiratory acidosis, elevated lactic acid, aspiration pneumonia and altered mental status. PCCM asked to admit. Clinical course in the ICU was characterized by acute encephalopathy, aspiration with hypoxia/hypercapnia, sespis, noted to have ST-T wave changes in anterolateral and inferior leads and was noted to have elevated troponin I, consistent with NSEMI. Dr Charolette Forward.  provided cardiology consultation. Transferred to [facility] on 03/07/13.     Consultants:  Dr Chesley Mires, PCCM.  Dr Charolette Forward, cardiologist.   Procedures:  Serial CXRs.    Antibiotics:  Vancomycin 03/05/13-03/08/13.  Zosyn 03/05/13-03/08/13.  Augumentin 03/08/13  HPI/Subjective: No new issues.   Objective: Vital signs in last 24 hours: Temp:  [97.8 F (36.6 C)-98.3 F (36.8 C)] 97.8 F (36.6 C) (06/05 0436) Pulse Rate:  [64-78] 64 (06/05 0436) Resp:  [18-20] 19 (06/05 0436) BP: (147-153)/(61-68) 147/68 mmHg (06/05 0436) SpO2:  [91 %-93 %] 93 % (06/05 0740) Weight:  [76.885 kg (169 lb 8 oz)] 76.885 kg (169 lb 8 oz) (06/05 0647) Weight change:  Last BM Date: 03/07/13  Intake/Output from previous day: 06/04 0701 - 06/05 0700 In: 2753.1 [P.O.:680; I.V.:1523.1; IV Piggyback:550] Out: 1200 [Urine:1200]     Physical Exam: General: Comfortable, alert, communicative, fully oriented, not short of breath at rest.  HEENT:  Mild clinical pallor, no jaundice, no conjunctival injection or discharge. Hydration is satisfactory.  NECK:  Supple, JVP not seen, no carotid bruits, no palpable lymphadenopathy, no palpable goiter. CHEST:  Clinically clear to auscultation, few wheezes, no crackles. HEART:  Sounds 1 and 2 heard, normal, regular, no murmurs. ABDOMEN:  Full, soft, no scars, non-tender, no palpable organomegaly, no palpable masses, normal bowel sounds. GENITALIA:  Not examined. LOWER EXTREMITIES:  No pitting edema, palpable peripheral pulses. MUSCULOSKELETAL SYSTEM:  Unremarkable. CENTRAL NERVOUS SYSTEM:  No focal neurologic deficit on gross examination.  Lab Results:  Recent Labs  03/07/13 0511 03/08/13 0540  WBC 9.6 10.0  HGB 11.2* 11.8*  HCT 35.6* 37.4  PLT 97* 98*    Recent Labs  03/07/13 0511 03/08/13 0540  NA 136 137  K 4.1 3.5  CL 107 105  CO2 23 21  GLUCOSE 159* 176*  BUN 10 5*  CREATININE 0.70 0.64  CALCIUM 6.5* 6.9*   Recent Results (from the past 240 hour(s))  CULTURE, BLOOD (ROUTINE X 2)     Status: None   Collection Time    03/05/13  5:50 AM      Result Value Range Status   Specimen Description BLOOD RIGHT EXTERNAL  JUGULAR   Final   Special Requests NONE   Final   Culture  Setup Time 03/05/2013 10:36   Final   Culture     Final   Value:        BLOOD CULTURE RECEIVED NO GROWTH TO DATE CULTURE WILL BE HELD FOR 5 DAYS BEFORE ISSUING A FINAL NEGATIVE REPORT   Report Status PENDING   Incomplete  CULTURE, BLOOD (ROUTINE X 2)     Status: None   Collection Time    03/05/13  7:12 AM      Result Value Range Status   Specimen Description BLOOD LEFT INTERNAL JUGULAR   Final   Special Requests BOTTLES DRAWN AEROBIC AND ANAEROBIC [facility] EACH   Final   Culture  Setup Time 03/05/2013 10:36   Final   Culture     Final   Value: STAPHYLOCOCCUS SPECIES (COAGULASE NEGATIVE)     Note: Gram Stain Report Called to,Read Back By and Verified With: TIA JOHNSON 03/06/13  5:34A.Reita Cliche   Report Status PENDING   Incomplete  URINE CULTURE     Status: None   Collection Time    03/05/13  7:57 AM      Result Value Range Status   Specimen Description URINE, RANDOM   Final   Special Requests NONE   Final   Culture  Setup Time 03/05/2013 11:34   Final   Colony Count 45,000 COLONIES/ML   Final   Culture     Final   Value: Multiple bacterial morphotypes present, none predominant. Suggest appropriate  recollection if clinically indicated.   Report Status 03/06/2013 FINAL   Final  MRSA PCR SCREENING     Status: None   Collection Time    03/05/13  8:40 AM      Result Value Range Status   MRSA by PCR NEGATIVE  NEGATIVE Final   Comment:            The GeneXpert MRSA Assay (FDA     approved for NASAL specimens     only), is one component of a     comprehensive MRSA colonization     surveillance program. It is not     intended to diagnose MRSA     infection nor to guide or     monitor treatment for     MRSA infections.  CULTURE, EXPECTORATED SPUTUM-ASSESSMENT     Status: None   Collection Time    03/05/13  1:05 PM      Result Value Range Status   Specimen Description SPUTUM   Final   Special Requests NONE   Final   Sputum  evaluation     Final   Value: THIS SPECIMEN IS ACCEPTABLE. RESPIRATORY CULTURE REPORT TO FOLLOW.   Report Status 03/05/2013 FINAL   Final  CULTURE, RESPIRATORY (NON-EXPECTORATED)     Status: None   Collection Time    03/05/13  1:05 PM      Result Value Range Status   Specimen Description SPUTUM   Final   Special Requests NONE   Final   Gram Stain     Final   Value: ABUNDANT WBC PRESENT, PREDOMINANTLY PMN     MODERATE SQUAMOUS EPITHELIAL CELLS PRESENT     FEW GRAM POSITIVE RODS     FEW GRAM POSITIVE COCCI     IN PAIRS RARE GRAM NEGATIVE RODS   Culture     Final   Value: MODERATE STAPHYLOCOCCUS AUREUS     Note: RIFAMPIN AND GENTAMICIN SHOULD NOT BE USED AS SINGLE DRUGS FOR TREATMENT OF STAPH INFECTIONS.   Report Status PENDING   Incomplete     Studies/Results: Dg Chest Port 1 View  03/07/2013   *RADIOLOGY REPORT*  Clinical Data: Follow up pneumonia  PORTABLE CHEST - 1 VIEW  Comparison: Prior chest x-ray 03/05/2013  Findings: Interval removal of left IJ central venous catheter. Significantly improved aeration of the lungs with decreased interstitial edema and largely resolved right basilar opacity likely reflecting decreased pleural effusion and atelectasis. There is some persistent patchy opacity in the left lung base. Stable cardiomegaly.  Background chronic lung disease persists.  IMPRESSION:  1.  Significantly improved aeration secondary to resolved pulmonary edema, decreased right pleural effusion and right basilar atelectasis. 2. Persistent left basilar patchy opacity consistent with the clinical history of pneumonia 3.  IJ central venous catheter has been removed   Original Report Authenticated By: Jacqulynn Cadet, M.D.    Medications: Scheduled Meds: . [facility] HOLD] albuterol  2.5 mg Nebulization Q6H  . [facility] HOLD] antiseptic oral rinse  15 mL Mouth Rinse BID  . [facility] HOLD] aspirin  81 mg Oral Daily  . [facility] HOLD] atorvastatin  80 mg Oral q1800  . [facility] HOLD] clopidogrel  75 mg Oral Q  breakfast  . [[facility] HOLD] gabapentin  300 mg Oral TID  . [facility] HOLD] levothyroxine  25 mcg Oral QAC breakfast  . [[facility] HOLD] methylPREDNISolone (SOLU-MEDROL) injection  40 mg Intravenous Q12H  . [facility] HOLD] metoprolol tartrate  12.5 mg Oral BID  . [facility] HOLD] pantoprazole  40 mg Oral Q1200  . [[facility] HOLD] piperacillin-tazobactam (ZOSYN)  IV  3.375 g Intravenous Q8H  . sodium chloride  3 mL Intravenous Q12H  . [facility] HOLD] vancomycin  1,000 mg Intravenous BID   Continuous Infusions: . sodium chloride 50 mL/hr at 03/07/13 1212  . sodium chloride 1 mL/kg/hr (03/07/13 2000)  . sodium chloride 750 mL (03/08/13 0957)  . heparin 950 Units/hr (03/07/13 1712)   PRN Meds:.sodium chloride, [[facility] HOLD] acetaminophen, [[facility] HOLD] albuterol, [[facility] HOLD] ALPRAZolam, [[facility] HOLD] HYDROcodone-acetaminophen, [[facility] HOLD] sodium chloride, sodium chloride    LOS: 3 days   [facility] Pager [phone]. If 8PM-8AM, please contact night-coverage at [email or web address], password [facility] 03/08/2013, 10:01 AM  LOS: 3 days

## 2013-03-09 LAB — CULTURE, RESPIRATORY W GRAM STAIN

## 2013-03-11 LAB — CULTURE, BLOOD (ROUTINE X 2)

## 2013-03-26 ENCOUNTER — Encounter: Payer: Self-pay | Admitting: Internal Medicine

## 2013-03-26 ENCOUNTER — Other Ambulatory Visit (INDEPENDENT_AMBULATORY_CARE_PROVIDER_SITE_OTHER): Payer: Medicare Other

## 2013-03-26 ENCOUNTER — Ambulatory Visit (INDEPENDENT_AMBULATORY_CARE_PROVIDER_SITE_OTHER): Payer: Medicare Other | Admitting: Internal Medicine

## 2013-03-26 VITALS — BP 140/76 | HR 81 | Temp 98.0°F | Resp 16 | Ht 66.0 in | Wt 167.2 lb

## 2013-03-26 DIAGNOSIS — E1149 Type 2 diabetes mellitus with other diabetic neurological complication: Secondary | ICD-10-CM

## 2013-03-26 DIAGNOSIS — I10 Essential (primary) hypertension: Secondary | ICD-10-CM

## 2013-03-26 DIAGNOSIS — IMO0002 Reserved for concepts with insufficient information to code with codable children: Secondary | ICD-10-CM

## 2013-03-26 DIAGNOSIS — E039 Hypothyroidism, unspecified: Secondary | ICD-10-CM

## 2013-03-26 DIAGNOSIS — M5416 Radiculopathy, lumbar region: Secondary | ICD-10-CM

## 2013-03-26 LAB — COMPREHENSIVE METABOLIC PANEL
ALT: 24 U/L (ref 0–35)
AST: 48 U/L — ABNORMAL HIGH (ref 0–37)
CO2: 32 mEq/L (ref 19–32)
Calcium: 8 mg/dL — ABNORMAL LOW (ref 8.4–10.5)
Chloride: 101 mEq/L (ref 96–112)
Creatinine, Ser: 0.9 mg/dL (ref 0.4–1.2)
Potassium: 4.1 mEq/L (ref 3.5–5.1)
Sodium: 135 mEq/L (ref 135–145)
Total Protein: 6.5 g/dL (ref 6.0–8.3)

## 2013-03-26 LAB — TSH: TSH: 2.71 u[IU]/mL (ref 0.35–5.50)

## 2013-03-26 LAB — HEMOGLOBIN A1C: Hgb A1c MFr Bld: 6.5 % (ref 4.6–6.5)

## 2013-03-26 MED ORDER — METOPROLOL TARTRATE 12.5 MG HALF TABLET: 12.5000 mg | ORAL_TABLET | Freq: Two times a day (BID) | ORAL | Status: DC

## 2013-03-26 MED ORDER — LEVOTHYROXINE SODIUM 25 MCG PO TABS: 25.0000 ug | ORAL_TABLET | Freq: Every day | ORAL | Status: DC

## 2013-03-26 MED ORDER — ATORVASTATIN CALCIUM 40 MG PO TABS: 40.0000 mg | ORAL_TABLET | Freq: Every day | ORAL | Status: DC

## 2013-03-26 MED ORDER — HYDROCODONE-ACETAMINOPHEN 10-325 MG PO TABS: 1.0000 | ORAL_TABLET | Freq: Four times a day (QID) | ORAL | Status: DC | PRN

## 2013-03-26 NOTE — Progress Notes (Signed)
Subjective:    Patient ID: Phyllis [name], female    DOB: 1956-03-10, 57 y.o.   MRN: [id number]  Back Pain This is a recurrent problem. The current episode started more than 1 year ago. The problem occurs constantly. The problem has been gradually worsening since onset. The pain is present in the lumbar spine. The quality of the pain is described as burning and aching. The pain radiates to the right thigh and right knee. The pain is at a severity of 4/10. The pain is moderate. The pain is worse during the day. The symptoms are aggravated by position. Associated symptoms include leg pain (right). Pertinent negatives include no abdominal pain, bladder incontinence, bowel incontinence, chest pain, dysuria, fever, headaches, numbness, paresis, paresthesias, pelvic pain, perianal numbness, tingling, weakness or weight loss. Risk factors include obesity and lack of exercise. She has tried analgesics for the symptoms. The treatment provided mild relief.      Review of Systems  Constitutional: Negative.  Negative for fever, chills, weight loss, diaphoresis, activity change, appetite change, fatigue and unexpected weight change.  HENT: Negative.   Eyes: Negative.   Respiratory: Negative.  Negative for cough, chest tightness, shortness of breath, wheezing and stridor.   Cardiovascular: Positive for leg swelling (edema in both feet). Negative for chest pain and palpitations.  Gastrointestinal: Negative for nausea, vomiting, abdominal pain, diarrhea, constipation and bowel incontinence.  Endocrine: Negative.   Genitourinary: Negative.  Negative for bladder incontinence, dysuria and pelvic pain.  Musculoskeletal: Positive for back pain. Negative for myalgias, joint swelling, arthralgias and gait problem.  Skin: Negative for color change, pallor, rash and wound.  Allergic/Immunologic: Negative.   Neurological: Negative for dizziness, tingling, tremors, weakness, numbness, headaches and paresthesias.   Hematological: Negative.  Negative for adenopathy. Does not bruise/bleed easily.  Psychiatric/Behavioral: Negative.        Objective:   Physical Exam  Vitals reviewed. Constitutional: She is oriented to person, place, and time. She appears well-developed and well-nourished. No distress.  HENT:  Head: Normocephalic and atraumatic.  Mouth/Throat: Oropharynx is clear and moist.  Eyes: Conjunctivae are normal. Right eye exhibits no discharge. Left eye exhibits no discharge. No scleral icterus.  Neck: Normal range of motion. Neck supple. No JVD present. No tracheal deviation present. No thyromegaly present.  Cardiovascular: Normal rate, regular rhythm, normal heart sounds and intact distal pulses.  Exam reveals no gallop and no friction rub.   No murmur heard. Pulmonary/Chest: Effort normal and breath sounds normal. No stridor. No respiratory distress. She has no wheezes. She has no rales. She exhibits no tenderness.  Abdominal: Soft. Bowel sounds are normal. She exhibits no distension and no mass. There is no tenderness. There is no rebound and no guarding.  Musculoskeletal: Normal range of motion. She exhibits edema (1+ pitting edema in BLE). She exhibits no tenderness.  Lymphadenopathy:    She has no cervical adenopathy.  Neurological: She is oriented to person, place, and time.  Skin: Skin is warm and dry. No rash noted. She is not diaphoretic. No erythema. No pallor.  Psychiatric: She has a normal mood and affect. Her behavior is normal. Judgment and thought content normal.     Lab Results  Component Value Date   WBC 10.0 03/08/2013   HGB 11.8* 03/08/2013   HCT 37.4 03/08/2013   PLT 98* 03/08/2013   GLUCOSE 176* 03/08/2013   CHOL 113 12/21/2012   TRIG 59.0 12/21/2012   HDL 40.60 12/21/2012   LDLCALC 61 12/21/2012   ALT 25  03/05/2013   AST 74* 03/05/2013   NA 137 03/08/2013   K 3.5 03/08/2013   CL 105 03/08/2013   CREATININE 0.64 03/08/2013   BUN 5* 03/08/2013   CO2 21 03/08/2013   TSH 2.98  12/21/2012   INR 1.44 03/05/2013   HGBA1C 6.0 12/21/2012       Assessment & Plan:

## 2013-03-26 NOTE — Assessment & Plan Note (Signed)
I will recheck her A1C and will address if needed

## 2013-03-26 NOTE — Assessment & Plan Note (Signed)
Her BP is well controlled I will recheck her lytes and renal function

## 2013-03-26 NOTE — Patient Instructions (Signed)

## 2013-03-26 NOTE — Assessment & Plan Note (Signed)
For now, she will continue norco for pain I have asked her to see pain management again

## 2013-03-26 NOTE — Assessment & Plan Note (Signed)
I will recheck her TSH today and will adjust her dose if needed

## 2013-03-27 ENCOUNTER — Encounter: Payer: Self-pay | Admitting: Internal Medicine

## 2013-03-27 NOTE — Addendum Note (Signed)
Addended by: [name] on: 03/27/2013 08:41 AM   Modules accepted: Orders

## 2013-04-12 ENCOUNTER — Encounter: Payer: Self-pay | Admitting: Internal Medicine

## 2013-04-12 ENCOUNTER — Ambulatory Visit (INDEPENDENT_AMBULATORY_CARE_PROVIDER_SITE_OTHER): Payer: Medicare Other | Admitting: Internal Medicine

## 2013-04-12 VITALS — BP 118/64 | HR 87 | Temp 98.5°F | Resp 12 | Ht 66.0 in | Wt 163.0 lb

## 2013-04-12 DIAGNOSIS — E559 Vitamin D deficiency, unspecified: Secondary | ICD-10-CM

## 2013-04-12 DIAGNOSIS — E8809 Other disorders of plasma-protein metabolism, not elsewhere classified: Secondary | ICD-10-CM

## 2013-04-12 LAB — MAGNESIUM: Magnesium: 1.9 mg/dL (ref 1.5–2.5)

## 2013-04-12 LAB — COMPREHENSIVE METABOLIC PANEL
Albumin: 2.7 g/dL — ABNORMAL LOW (ref 3.5–5.2)
Alkaline Phosphatase: 92 U/L (ref 39–117)
BUN: 6 mg/dL (ref 6–23)
Calcium: 8.6 mg/dL (ref 8.4–10.5)
Creatinine, Ser: 0.9 mg/dL (ref 0.4–1.2)
Glucose, Bld: 74 mg/dL (ref 70–99)
Potassium: 3.5 mEq/L (ref 3.5–5.1)

## 2013-04-12 NOTE — Progress Notes (Signed)
Subjective:     Patient ID: Phyllis [name], female   DOB: November 25, 1955, 57 y.o.   MRN: [id number]  HPI Phyllis [name] is a pleasant 57 year old woman, referred by her PCP, Dr. Ronnald Ramp, for evaluation of hypocalcemia.  I reviewed patient's calcium and albumin levels: Lab Results  Component Value Date   CALCIUM 8.0* - corrected Ca 9.12 03/26/2013   CALCIUM 6.9* - cannot calc. corr. Ca 03/08/2013   CALCIUM 6.5* - cannot calc. corr. Ca 03/07/2013   CALCIUM 6.6* - cannot calc. corr. Ca 03/06/2013   CALCIUM 6.7* - corrected Ca 7.98 03/05/2013   CALCIUM 8.9 12/21/2012   CALCIUM 9.5 08/23/2012   CALCIUM 9.0 03/20/2012   CALCIUM 9.5 03/15/2012   ALBUMIN 2.6* 03/26/2013   ALBUMIN 2.4* 03/05/2013   ALBUMIN 3.1* 12/21/2012   ALBUMIN 3.7 08/23/2012   ALBUMIN 3.2* 03/15/2012   ALBUMIN 3.6 10/14/2011   ALBUMIN 3.7 04/01/2011   ALBUMIN 3.4* 12/07/2010   ALBUMIN 3.8 08/06/2010   The values shown in bold italics above were obtained when the patient was admitted a month ago for accidental opioid (Opana) overdose, was in acute respiratory failure, had aspiration pneumonia, acute encephalopathy, and acute respiratory failure, acute renal failure, NSTEMI, and sepsis/SIRS. Her albumin was low at 2.4 on 03/05/2013.   - Pt was dx hepatitis C ~10 years ago, but she does not have liver failure. An INR was normal at 1.44 on 03/05/2013. - No h/o kidney stones, fractures, but has osteoporosis. I reviewed her DEXA scan from 01/01/2013: osteopenia of the spine, L1-L4 T score of -1.9 (however, increased standard deviation between L T-scores), and osteoporosis of the hip with a total right hip score of -2.7. She started taking Prolia - first injection 1.5 mo ago. No prior h/o taking Bisphosphonates.  - No h/o rickets as a child. 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 - No FH of calcium problems, osteoporosis. - No h/o perioral numbness, hand cramps.  - has a h/o ENT surgery but unclear what type per her report and review  of chart  She lost 25 lbs in a year (intentional) - through diet. She did not feel like eating before, so she started to eat more after leaving the hospital: - Breakfast: now started to eat  1 Sausage link + 1 waffle + syrup - Lunch: none - Dinner: 1 chicken leg/pork chop + bread - Snacks: [employer] PB cups seldom  Not on calcium or vitamin D supplements. She has a history of vitamin D deficiency. A 1,25 dihydroxy vitamin D level was 77 (high) on 08/23/2012.  I reviewed the patient's chart, including prior hospitalization records and labs. She had a cortisol level of 43 in the hospital. She also has a history of DM 2, with neuropathy, with last hemoglobin A1c of 6.5% on 03/26/2013. She also has mild, persistent asthma - this is improving, anxiety and depression, hypertension, GERD, hypothyroidism, right lumbar radiculitis.   Review of Systems Constitutional: no weight gain/loss, no fatigue, + decreased appetite, no subjective hyperthermia/hypothermia Eyes: no blurry vision, no xerophthalmia ENT: no sore throat, no nodules palpated in throat, no dysphagia/odynophagia, no hoarseness Cardiovascular: no CP/SOB/palpitations/leg swelling Respiratory: + cough/no SOB Gastrointestinal: no N/V/D/C Musculoskeletal: no muscle/joint aches Skin: no rashes Neurological: no tremors/numbness/tingling/dizziness Psychiatric: no depression/+ anxiety Low libido   Past Medical History  Diagnosis Date  . Eczema   . ASTHMA 06/06/2009  . BACK PAIN 08/06/2010  . DEPRESSION 06/06/2009  . Edema 05/20/2010  . GERD 06/06/2009  .  HEPATITIS C WITHOUT HEPATIC COMA 06/06/2009  . HYPERTENSION 06/06/2009  . OSTEOPOROSIS 06/06/2009  . 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 TOBACCO USE 06/05/2010  . Unspecified hypothyroidism 06/05/2010  . VITAMIN D DEFICIENCY 06/06/2009  . Chronic pancreatitis    Past Surgical History  Procedure Laterality Date  . Cholecystectomy    . Breast lumpectomy      benign, right   . Tubal ligation    . Tonsillectomy    . Lumbar laminectomy     History   Social History  . Marital Status: Married    Spouse Name: N/A    Number of Children: 3: 11, 53, 40   Occupational History  . disabled   Social History Main Topics  . Smoking status: Current Every Day Smoker -- 1.00 packs/day for 35 years    Types: Cigarettes - now 2 cigs a day, trying to quit  . Smokeless tobacco: Never Used     Comment: Currently only smokes with stress.  . Alcohol Use: No  . Drug Use: No  . Sexually Active: Yes    Birth Control/ Protection: Surgical   Other Topics Concern  . Not on file   Social History Narrative  . No narrative on file   Current Outpatient Prescriptions on File Prior to Visit  Medication Sig Dispense Refill  . ALPRAZolam (XANAX) 0.5 MG tablet Take 0.5 mg by mouth 3 (three) times daily as needed for anxiety.      [name] aspirin 81 MG chewable tablet Chew 1 tablet (81 mg total) by mouth daily.  30 tablet  0  . atorvastatin (LIPITOR) 40 MG tablet Take 1 tablet (40 mg total) by mouth daily at 6 PM.  90 tablet  3  . gabapentin (NEURONTIN) 300 MG capsule Take 1 capsule (300 mg total) by mouth 3 (three) times daily.  90 capsule  3  . HYDROcodone-acetaminophen (NORCO) 10-325 MG per tablet Take 1 tablet by mouth every 6 (six) hours as needed for pain.  75 tablet  3  . levothyroxine (SYNTHROID) 25 MCG tablet Take 1 tablet (25 mcg total) by mouth daily.  90 tablet  1  . metoprolol tartrate (LOPRESSOR) 12.5 mg TABS Take 0.5 tablets (12.5 mg total) by mouth 2 (two) times daily.  180 tablet  3   No current facility-administered medications on file prior to visit.   Allergies  Allergen Reactions  . Meperidine Hcl     hallucinations  . Naproxen     Extreme bruising   Family History  Problem Relation Age of Onset  . COPD Father   . Hypertension Mother   . Colon cancer Neg Hx   . Stomach cancer Neg Hx    Objective:   Physical Exam BP 118/64  Pulse 87  Temp(Src) 98.5 F  (36.9 C) (Oral)  Resp 12  Ht _0  (1.676 m)  Wt 163 lb (73.936 kg)  BMI 26.32 kg/m2  SpO2 97%  LMP 10/05/1999 Wt Readings from Last 3 Encounters:  04/12/13 163 lb (73.936 kg)  03/26/13 167 lb 4 oz (75.864 kg)  03/08/13 169 lb 8 oz (76.885 kg)   Constitutional: normal weight, in NAD; neg. Chvostek sign bilat. Eyes: PERRLA, EOMI, no exophthalmos ENT: moist mucous membranes, no thyromegaly, no cervical lymphadenopathy;  Cardiovascular: RRR, No MRG Respiratory: wheezing throughout lung fields, no increased respiratory effort Gastrointestinal: abdomen soft, NT, ND, BS+ Musculoskeletal: no deformities, strength intact in all 4 Skin: moist, warm, macular rash lateral right leg 2x 2 cm,  dark, healing, with central discoloration Neurological: no tremor with outstretched hands, DTR normal in all 4  Assessment:     1. Hypocalcemia - X1, during sepsis/SIRS episode - chronic pancreatitis, remote h/o alcohol abuse - controlled - low calcium diet  2. Hypoalbuminemia   - likely malnutrition - no CKD - no liver failure (has hep C)  Plan:     1. Hypocalcemia Patient had her first low calcium level while being in the hospital for sepsis/SIRS, with acute renal failure and acute respiratory failure. At that time, she had hypoalbuminemia and a corrected calcium that was low, at 7.98. After this episode, labs were rechecked 2 weeks later in PCPs office, and although a total calcium was still low, at 8.0, her albumin in was persistently low at 2.6, so that a calculated corrected calcium was normal, at 9.12.   - I discussed with the patient about possible causes of hypocalcemia, possible contributors for her could have been: Sepsis/SIRS, with contributions from low calcium diet, Prolia injection, chronic pancreatitis. I do not have information about her vitamin D status, but this could contribute, if low.  - I believe her multiorgan dysfunction was probably the main culprit. However, since she has  osteoporosis, and a poor diet, without dairy products or green leafy vegetables, I suggested that she started a calcium supplement 600 mg daily, and supplemented up to approximately 1000 mg daily by diet. I gave her information about calcium and vitamin D contents of different foods. - Will check the following labs: a repeat calcium level, albumin, PTH, vitamin D, 1,25 vitamin D, magnesium, phosphorus  2. Hypoalbuminemia The patient had a recent significant weight loss, which was induced by not eating, pper her report. She describes skipping almost a whole day without eating or only eating a bag of chips. This has improved after she got out of the hospital, however, her diet is still very poor. She has persistently low albumin, which does not appear to be caused by chronic kidney disease or liver failure (but has cirrhosis per BX in 2008). It most likely originates in her poor diet. I suggested that she meets with nutrition for suggestions about how to improve her diet. I placed this referral.  If her calcium is normal, I would not schedule followup appointment for the patient, but she is welcome to return on an as needed basis.  Office Visit on 04/12/2013  Component Date Value Range Status  . Phosphorus 04/12/2013 3.3  2.3 - 4.6 mg/dL Final  . Sodium 04/12/2013 131* 135 - 145 mEq/L Final  . Potassium 04/12/2013 3.5  3.5 - 5.1 mEq/L Final  . Chloride 04/12/2013 98  96 - 112 mEq/L Final  . CO2 04/12/2013 38* 19 - 32 mEq/L Final  . Glucose, Bld 04/12/2013 74  70 - 99 mg/dL Final  . BUN 04/12/2013 6  6 - 23 mg/dL Final  . Creatinine, Ser 04/12/2013 0.9  0.4 - 1.2 mg/dL Final  . Total Bilirubin 04/12/2013 0.6  0.3 - 1.2 mg/dL Final  . Alkaline Phosphatase 04/12/2013 92  39 - 117 U/L Final  . AST 04/12/2013 66* 0 - 37 U/L Final  . ALT 04/12/2013 33  0 - 35 U/L Final  . Total Protein 04/12/2013 6.6  6.0 - 8.3 g/dL Final  . Albumin 04/12/2013 2.7* 3.5 - 5.2 g/dL Final  . Calcium 04/12/2013 8.6  8.4  - 10.5 mg/dL Final  . GFR 04/12/2013 83.57  >60.00 mL/min Final  . Magnesium 04/12/2013 1.9  1.5 -  2.5 mg/dL Final  . PTH 04/12/2013 41.5  14.0 - 72.0 pg/mL Final  . Vit D, 25-Hydroxy 04/12/2013 14* 30 - 89 ng/mL Final   Comment: This assay accurately quantifies Vitamin D, which is the sum of the                          25-Hydroxy forms of Vitamin D2 and D3.  Studies have shown that the                          optimum concentration of 25-Hydroxy Vitamin D is 30 ng/mL or higher.                           Concentrations of Vitamin D between 20 and 29 ng/mL are considered to                          be insufficient and concentrations less than 20 ng/mL are considered                          to be deficient for Vitamin D.  . Vitamin D 1, 25 (OH) Total 04/12/2013 35  18 - 72 pg/mL Final  . Vitamin D3 1, 25 (OH) 04/12/2013 35   Final  . Vitamin D2 1, 25 (OH) 04/12/2013 <8   Final   Comment: Vitamin D3, 1,25(OH)2 indicates both endogenous                          production and supplementation.  Vitamin D2, 1,25(OH)2                          is an indicator of exogeous sources, such as diet or                          supplementation.  Interpretation and therapy are based                          on measurement of Vitamin D,1,25(OH)2, Total.                          This test was developed and its performance                          characteristics have been determined by [facility], [place], [place].                          Performance characteristics refer to the                          analytical performance of the test.   Vitamin D deficiency, start Ergocalciferol 50,000 units weekly, after which, continue with 2000 IU daily.  Called and discussed with pt. She agrees to plan.   I asked her whether she would like to follow up with me or Dr. Ronnald Ramp for the vitamin D deficiency  and she will return to Dr. Ronnald Ramp - i will let him know that she  will need a new vit D level at the end of her high dose tx or at next visit with him.

## 2013-04-12 NOTE — Patient Instructions (Addendum)
Please see nutrition for ways to improve protein in your diet.

## 2013-04-13 LAB — VITAMIN D 25 HYDROXY (VIT D DEFICIENCY, FRACTURES): Vit D, 25-Hydroxy: 14 ng/mL — ABNORMAL LOW (ref 30–89)

## 2013-04-13 LAB — PARATHYROID HORMONE, INTACT (NO CA): PTH: 41.5 pg/mL (ref 14.0–72.0)

## 2013-04-16 ENCOUNTER — Encounter: Payer: Self-pay | Admitting: Internal Medicine

## 2013-04-16 DIAGNOSIS — E8809 Other disorders of plasma-protein metabolism, not elsewhere classified: Secondary | ICD-10-CM | POA: Insufficient documentation

## 2013-04-16 LAB — VITAMIN D 1,25 DIHYDROXY: Vitamin D3 1, 25 (OH)2: 35 pg/mL

## 2013-04-16 MED ORDER — ERGOCALCIFEROL 1.25 MG (50000 UT) PO CAPS: 50000.0000 [IU] | ORAL_CAPSULE | ORAL | Status: DC

## 2013-04-24 ENCOUNTER — Other Ambulatory Visit: Payer: Self-pay | Admitting: Pain Medicine

## 2013-04-24 DIAGNOSIS — M545 Low back pain: Secondary | ICD-10-CM

## 2013-04-25 ENCOUNTER — Ambulatory Visit: Payer: Medicare Other | Admitting: Internal Medicine

## 2013-05-02 ENCOUNTER — Ambulatory Visit
Admission: RE | Admit: 2013-05-02 | Discharge: 2013-05-02 | Disposition: A | Discharge status: Home / Self Care | Payer: Medicare Other | Source: Ambulatory Visit | Attending: Pain Medicine | Admitting: Pain Medicine

## 2013-05-02 DIAGNOSIS — M545 Low back pain, unspecified: Secondary | ICD-10-CM

## 2013-05-02 MED ORDER — GADOBENATE DIMEGLUMINE 529 MG/ML IV SOLN
13.0000 mL | Freq: Once | INTRAVENOUS | Status: AC | PRN
  Administered 2013-05-02: 13 mL via INTRAVENOUS

## 2013-07-11 ENCOUNTER — Other Ambulatory Visit: Payer: Self-pay | Admitting: Internal Medicine

## 2013-07-12 NOTE — Telephone Encounter (Signed)
Alprazolam has been called to pharmacy

## 2013-07-27 ENCOUNTER — Ambulatory Visit (INDEPENDENT_AMBULATORY_CARE_PROVIDER_SITE_OTHER): Payer: Medicare Other

## 2013-07-27 ENCOUNTER — Ambulatory Visit (INDEPENDENT_AMBULATORY_CARE_PROVIDER_SITE_OTHER)
Admission: RE | Admit: 2013-07-27 | Discharge: 2013-07-27 | Disposition: A | Discharge status: Home / Self Care | Payer: Medicare Other | Source: Ambulatory Visit | Attending: Internal Medicine | Admitting: Internal Medicine

## 2013-07-27 ENCOUNTER — Ambulatory Visit (INDEPENDENT_AMBULATORY_CARE_PROVIDER_SITE_OTHER): Payer: Medicare Other | Admitting: Internal Medicine

## 2013-07-27 ENCOUNTER — Encounter: Payer: Self-pay | Admitting: Internal Medicine

## 2013-07-27 VITALS — BP 140/94 | HR 80 | Temp 97.6°F | Resp 16 | Ht 66.0 in | Wt 146.5 lb

## 2013-07-27 DIAGNOSIS — M5416 Radiculopathy, lumbar region: Secondary | ICD-10-CM

## 2013-07-27 DIAGNOSIS — I2510 Atherosclerotic heart disease of native coronary artery without angina pectoris: Secondary | ICD-10-CM

## 2013-07-27 DIAGNOSIS — R05 Cough: Secondary | ICD-10-CM

## 2013-07-27 DIAGNOSIS — J209 Acute bronchitis, unspecified: Secondary | ICD-10-CM

## 2013-07-27 DIAGNOSIS — B1710 Acute hepatitis C without hepatic coma: Secondary | ICD-10-CM

## 2013-07-27 DIAGNOSIS — R059 Cough, unspecified: Secondary | ICD-10-CM

## 2013-07-27 DIAGNOSIS — E039 Hypothyroidism, unspecified: Secondary | ICD-10-CM

## 2013-07-27 DIAGNOSIS — J440 Chronic obstructive pulmonary disease with acute lower respiratory infection: Secondary | ICD-10-CM

## 2013-07-27 DIAGNOSIS — F418 Other specified anxiety disorders: Secondary | ICD-10-CM

## 2013-07-27 DIAGNOSIS — F341 Dysthymic disorder: Secondary | ICD-10-CM

## 2013-07-27 DIAGNOSIS — I10 Essential (primary) hypertension: Secondary | ICD-10-CM

## 2013-07-27 DIAGNOSIS — E1149 Type 2 diabetes mellitus with other diabetic neurological complication: Secondary | ICD-10-CM

## 2013-07-27 DIAGNOSIS — IMO0002 Reserved for concepts with insufficient information to code with codable children: Secondary | ICD-10-CM

## 2013-07-27 DIAGNOSIS — Z23 Encounter for immunization: Secondary | ICD-10-CM | POA: Insufficient documentation

## 2013-07-27 LAB — URINALYSIS, ROUTINE W REFLEX MICROSCOPIC
Bilirubin Urine: NEGATIVE
Hgb urine dipstick: NEGATIVE
Ketones, ur: NEGATIVE
Total Protein, Urine: NEGATIVE
Urine Glucose: NEGATIVE
Urobilinogen, UA: 1 (ref 0.0–1.0)

## 2013-07-27 LAB — BASIC METABOLIC PANEL
BUN: 5 mg/dL — ABNORMAL LOW (ref 6–23)
Calcium: 9.5 mg/dL (ref 8.4–10.5)
Creatinine, Ser: 0.7 mg/dL (ref 0.4–1.2)
GFR: 117.88 mL/min (ref >60.00)

## 2013-07-27 LAB — HEMOGLOBIN A1C: Hgb A1c MFr Bld: 5.7 % (ref 4.6–6.5)

## 2013-07-27 MED ORDER — HYDROCODONE-ACETAMINOPHEN 10-325 MG PO TABS: 1.0000 | ORAL_TABLET | Freq: Four times a day (QID) | ORAL | Status: DC | PRN

## 2013-07-27 MED ORDER — SERTRALINE HCL 50 MG PO TABS: 50.0000 mg | ORAL_TABLET | Freq: Every day | ORAL | Status: DC

## 2013-07-27 MED ORDER — LOSARTAN POTASSIUM 100 MG PO TABS: 100.0000 mg | ORAL_TABLET | Freq: Every day | ORAL | Status: DC

## 2013-07-27 MED ORDER — ALPRAZOLAM 0.5 MG PO TABS: 1.0000 mg | ORAL_TABLET | Freq: Three times a day (TID) | ORAL | Status: DC | PRN

## 2013-07-27 MED ORDER — AZITHROMYCIN 500 MG PO TABS: 500.0000 mg | ORAL_TABLET | Freq: Every day | ORAL | Status: DC

## 2013-07-27 NOTE — Assessment & Plan Note (Signed)
I will treat the infection with zithromax

## 2013-07-27 NOTE — Progress Notes (Signed)
Subjective:    Patient ID: Phyllis [name], female    DOB: 1956/06/18, 57 y.o.   MRN: [id number]  Cough This is a new problem. The current episode started in the past 7 days. The problem has been unchanged. The problem occurs every few hours. The cough is productive of purulent sputum. Pertinent negatives include no chest pain, chills, ear congestion, ear pain, fever, headaches, heartburn, hemoptysis, myalgias, nasal congestion, postnasal drip, rash, rhinorrhea, sore throat, shortness of breath, sweats, weight loss or wheezing. Risk factors for lung disease include smoking/tobacco exposure. She has tried nothing for the symptoms. The treatment provided no relief.      Review of Systems  Constitutional: Negative.  Negative for fever, chills, weight loss, diaphoresis, appetite change and fatigue.  HENT: Negative.  Negative for ear pain, postnasal drip, rhinorrhea, sinus pressure, sore throat, trouble swallowing and voice change.   Eyes: Negative.   Respiratory: Positive for cough. Negative for apnea, hemoptysis, choking, chest tightness, shortness of breath, wheezing and stridor.   Cardiovascular: Negative.  Negative for chest pain, palpitations and leg swelling.  Gastrointestinal: Negative.  Negative for heartburn, nausea, vomiting, abdominal pain, diarrhea, constipation and blood in stool.  Endocrine: Negative.  Negative for polydipsia, polyphagia and polyuria.  Genitourinary: Negative.   Musculoskeletal: Positive for back pain. Negative for arthralgias, gait problem, joint swelling, myalgias, neck pain and neck stiffness.  Skin: Negative.  Negative for rash.  Allergic/Immunologic: Negative.   Neurological: Negative.  Negative for headaches.  Hematological: Negative.  Negative for adenopathy. Does not bruise/bleed easily.  Psychiatric/Behavioral: Positive for dysphoric mood. Negative for suicidal ideas, hallucinations, behavioral problems, confusion, sleep disturbance, self-injury,  decreased concentration and agitation. The patient is nervous/anxious. The patient is not hyperactive.        Objective:   Physical Exam  Vitals reviewed. Constitutional: She is oriented to person, place, and time. She appears well-developed and well-nourished. No distress.  HENT:  Head: Normocephalic and atraumatic.  Mouth/Throat: Oropharynx is clear and moist. No oropharyngeal exudate.  Eyes: Conjunctivae are normal. Right eye exhibits no discharge. Left eye exhibits no discharge. No scleral icterus.  Neck: Normal range of motion. Neck supple. No JVD present. No tracheal deviation present. No thyromegaly present.  Cardiovascular: Normal rate, regular rhythm, normal heart sounds and intact distal pulses.  Exam reveals no gallop and no friction rub.   No murmur heard. Pulmonary/Chest: Effort normal and breath sounds normal. No stridor. No respiratory distress. She has no wheezes. She has no rales. She exhibits no tenderness.  Abdominal: Soft. Bowel sounds are normal. She exhibits no distension and no mass. There is no tenderness. There is no rebound and no guarding.  Musculoskeletal: Normal range of motion. She exhibits no edema and no tenderness.  Lymphadenopathy:    She has no cervical adenopathy.  Neurological: She is oriented to person, place, and time.  Skin: Skin is warm and dry. No rash noted. She is not diaphoretic. No erythema. No pallor.  Psychiatric: Her behavior is normal. Judgment and thought content normal. Her mood appears not anxious. Her affect is not angry, not labile and not inappropriate. Her speech is not rapid and/or pressured, not tangential and not slurred. She is not agitated, not aggressive, not hyperactive, not slowed, not withdrawn, not actively hallucinating and not combative. Thought content is not paranoid and not delusional. Cognition and memory are normal. She exhibits a depressed mood. She expresses no homicidal and no suicidal ideation. She expresses no  suicidal plans and no homicidal plans. She  is communicative. She is attentive.     Lab Results  Component Value Date   WBC 10.0 03/08/2013   HGB 11.8* 03/08/2013   HCT 37.4 03/08/2013   PLT 98* 03/08/2013   GLUCOSE 74 04/12/2013   CHOL 113 12/21/2012   TRIG 59.0 12/21/2012   HDL 40.60 12/21/2012   LDLCALC 61 12/21/2012   ALT 33 04/12/2013   AST 66* 04/12/2013   NA 131* 04/12/2013   K 3.5 04/12/2013   CL 98 04/12/2013   CREATININE 0.9 04/12/2013   BUN 6 04/12/2013   CO2 38* 04/12/2013   TSH 2.71 03/26/2013   INR 1.44 03/05/2013   HGBA1C 6.5 03/26/2013       Assessment & Plan:

## 2013-07-27 NOTE — Assessment & Plan Note (Signed)
She will cont norco as needed for pain She wants to see a pain management doctor in [facility]

## 2013-07-27 NOTE — Assessment & Plan Note (Signed)
She is due for a cardiology follow up

## 2013-07-27 NOTE — Patient Instructions (Signed)
Acute Bronchitis You have acute bronchitis. This means you have a chest cold. The airways in your lungs are red and sore (inflamed). Acute means it is sudden onset.  CAUSES Bronchitis is most often caused by the same virus that causes a cold. SYMPTOMS   Body aches.  Chest congestion.  Chills.  Cough.  Fever.  Shortness of breath.  Sore throat. TREATMENT  Acute bronchitis is usually treated with rest, fluids, and medicines for relief of fever or cough. Most symptoms should go away after a few days or a week. Increased fluids may help thin your secretions and will prevent dehydration. Your caregiver may give you an inhaler to improve your symptoms. The inhaler reduces shortness of breath and helps control cough. You can take over-the-counter pain relievers or cough medicine to decrease coughing, pain, or fever. A cool-air vaporizer may help thin bronchial secretions and make it easier to clear your chest. Antibiotics are usually not needed but can be prescribed if you smoke, are seriously ill, have chronic lung problems, are elderly, or you are at higher risk for developing complications.Allergies and asthma can make bronchitis worse. Repeated episodes of bronchitis may cause longstanding lung problems. Avoid smoking and secondhand smoke.Exposure to cigarette smoke or irritating chemicals will make bronchitis worse. If you are a cigarette smoker, consider using nicotine gum or skin patches to help control withdrawal symptoms. Quitting smoking will help your lungs heal faster. Recovery from bronchitis is often slow, but you should start feeling better after 2 to 3 days. Cough from bronchitis frequently lasts for 3 to 4 weeks. To prevent another bout of acute bronchitis:  Quit smoking.  Wash your hands frequently to get rid of viruses or use a hand sanitizer.  Avoid other people with cold or virus symptoms.  Try not to touch your hands to your mouth, nose, or eyes. SEEK IMMEDIATE  MEDICAL CARE IF:  You develop increased fever, chills, or chest pain.  You have severe shortness of breath or bloody sputum.  You develop dehydration, fainting, repeated vomiting, or a severe headache.  You have no improvement after 1 week of treatment or you get worse. MAKE SURE YOU:   Understand these instructions.  Will watch your condition.  Will get help right away if you are not doing well or get worse. Document Released: 10/28/2004 Document Revised: 12/13/2011 Document Reviewed: 01/13/2011 [facility] Patient Information 2014 [place], [place].

## 2013-07-27 NOTE — Assessment & Plan Note (Signed)
Her CXR is negative for PNA but it is positive for emphysema

## 2013-07-27 NOTE — Assessment & Plan Note (Signed)
Her BP is not well controlled so I have asked her to start an ARB

## 2013-07-27 NOTE — Assessment & Plan Note (Signed)
She wants to be seen at the Hep C clinic again to see if she can take the new meds for Hep C infection She is also due for an AFP and Liver U/S to look for [facility]

## 2013-07-27 NOTE — Assessment & Plan Note (Signed)
She will cont xanax as needed and I have asked her to start zoloft

## 2013-07-27 NOTE — Assessment & Plan Note (Signed)
She is due for an A1C today

## 2013-07-27 NOTE — Assessment & Plan Note (Signed)
I will check her TSh and will adjust her dose if needed

## 2013-07-28 LAB — DRUGS OF ABUSE SCREEN W/O ALC, ROUTINE URINE
Barbiturate Quant, Ur: NEGATIVE
Cocaine Metabolites: NEGATIVE
Creatinine,U: 35.9 mg/dL
Marijuana Metabolite: NEGATIVE
Methadone: NEGATIVE
Opiate Screen, Urine: NEGATIVE

## 2013-07-28 LAB — OXYCODONE SCREEN, UA, RFLX CONFIRM

## 2013-07-30 ENCOUNTER — Telehealth: Payer: Self-pay | Admitting: Internal Medicine

## 2013-07-30 NOTE — Telephone Encounter (Signed)
07/30/2013  Pt had question regarding the RX :   losartan (COZAAR) 100 MG tablet .  Pt states that she is already taking another RX for BP issues and wants to know if this is to replace that RX or is to be taken with the RX.  Please contact pt at [phone].

## 2013-07-31 NOTE — Telephone Encounter (Signed)
Pt notified per MD

## 2013-07-31 NOTE — Telephone Encounter (Signed)
She needs both The new one is to protect her kidneys

## 2013-07-31 NOTE — Telephone Encounter (Signed)
Please advise 

## 2013-08-01 ENCOUNTER — Ambulatory Visit: Payer: Medicare Other | Admitting: Physician Assistant

## 2013-08-02 ENCOUNTER — Encounter: Payer: Self-pay | Admitting: Internal Medicine

## 2013-08-02 ENCOUNTER — Encounter: Payer: Self-pay | Admitting: Physician Assistant

## 2013-08-02 ENCOUNTER — Ambulatory Visit
Admission: RE | Admit: 2013-08-02 | Discharge: 2013-08-02 | Disposition: A | Discharge status: Home / Self Care | Payer: Medicare Other | Source: Ambulatory Visit | Attending: Internal Medicine | Admitting: Internal Medicine

## 2013-08-02 DIAGNOSIS — B1710 Acute hepatitis C without hepatic coma: Secondary | ICD-10-CM

## 2013-08-02 LAB — BENZODIAZEPINES (GC/LC/MS), URINE
Alprazolam (GC/LC/MS), ur confirm: 117 ng/mL
Alprazolam metabolite (GC/LC/MS), ur confirm: 175 ng/mL
Clonazepam metabolite (GC/LC/MS), ur confirm: NEGATIVE ng/mL
Diazepam (GC/LC/MS), ur confirm: NEGATIVE ng/mL
Flunitrazepam metabolite (GC/LC/MS), ur confirm: NEGATIVE ng/mL
Flurazepam metabolite (GC/LC/MS), ur confirm: NEGATIVE ng/mL
Lorazepam (GC/LC/MS), ur confirm: NEGATIVE ng/mL
Temazepam (GC/LC/MS), ur confirm: NEGATIVE ng/mL

## 2013-08-02 LAB — OPIATES/OPIOIDS (LC/MS-MS)
Heroin (6-AM), UR: NEGATIVE ng/mL
Hydrocodone: NEGATIVE ng/mL
Norhydrocodone, Ur: NEGATIVE ng/mL
Noroxycodone, Ur: >2500 ng/mL
Oxycodone, ur: 2712 ng/mL
Oxymorphone: 617 ng/mL

## 2013-08-17 ENCOUNTER — Ambulatory Visit (INDEPENDENT_AMBULATORY_CARE_PROVIDER_SITE_OTHER): Payer: Medicare Other | Admitting: Internal Medicine

## 2013-08-17 ENCOUNTER — Encounter: Payer: Self-pay | Admitting: Internal Medicine

## 2013-08-17 VITALS — BP 128/72 | HR 86 | Temp 97.8°F | Resp 16 | Ht 66.0 in | Wt 145.5 lb

## 2013-08-17 DIAGNOSIS — F341 Dysthymic disorder: Secondary | ICD-10-CM

## 2013-08-17 DIAGNOSIS — IMO0002 Reserved for concepts with insufficient information to code with codable children: Secondary | ICD-10-CM

## 2013-08-17 DIAGNOSIS — F418 Other specified anxiety disorders: Secondary | ICD-10-CM

## 2013-08-17 DIAGNOSIS — M5416 Radiculopathy, lumbar region: Secondary | ICD-10-CM

## 2013-08-17 DIAGNOSIS — I10 Essential (primary) hypertension: Secondary | ICD-10-CM

## 2013-08-17 MED ORDER — HYDROCODONE-ACETAMINOPHEN 10-325 MG PO TABS: 1.0000 | ORAL_TABLET | Freq: Four times a day (QID) | ORAL | Status: DC | PRN

## 2013-08-17 MED ORDER — ALPRAZOLAM 0.5 MG PO TABS: 0.5000 mg | ORAL_TABLET | Freq: Three times a day (TID) | ORAL | Status: DC | PRN

## 2013-08-17 NOTE — Patient Instructions (Signed)

## 2013-08-17 NOTE — Progress Notes (Signed)
Subjective:    Patient ID: Phyllis [name], female    DOB: 02-18-1956, 57 y.o.   MRN: [id number]  Back Pain This is a chronic problem. The current episode started more than 1 year ago. The problem occurs constantly. The problem has been gradually worsening since onset. The pain is present in the lumbar spine. The quality of the pain is described as stabbing and aching. The pain radiates to the right thigh. The pain is at a severity of 7/10. The pain is severe. The pain is worse during the day. Pertinent negatives include no abdominal pain, bladder incontinence, bowel incontinence, chest pain, dysuria, fever, headaches, leg pain, numbness, paresis, paresthesias, pelvic pain, perianal numbness, tingling, weakness or weight loss. Risk factors include obesity and lack of exercise. She has tried analgesics for the symptoms. The treatment provided moderate relief.      Review of Systems  Constitutional: Negative.  Negative for fever, chills, weight loss, diaphoresis, appetite change and fatigue.  HENT: Negative.   Eyes: Negative.   Respiratory: Negative.  Negative for cough, chest tightness, shortness of breath, wheezing and stridor.   Cardiovascular: Negative.  Negative for chest pain and palpitations.  Gastrointestinal: Negative.  Negative for nausea, abdominal pain, diarrhea, constipation, blood in stool and bowel incontinence.  Endocrine: Negative.   Genitourinary: Negative.  Negative for bladder incontinence, dysuria and pelvic pain.  Musculoskeletal: Positive for back pain. Negative for arthralgias, gait problem, joint swelling, myalgias, neck pain and neck stiffness.  Skin: Negative.   Allergic/Immunologic: Negative.   Neurological: Negative.  Negative for dizziness, tingling, syncope, weakness, numbness, headaches and paresthesias.  Hematological: Negative.  Negative for adenopathy. Does not bruise/bleed easily.  Psychiatric/Behavioral: Positive for sleep disturbance. Negative for  suicidal ideas, hallucinations, behavioral problems, confusion, self-injury, dysphoric mood, decreased concentration and agitation. The patient is nervous/anxious. The patient is not hyperactive.        Objective:   Physical Exam  Vitals reviewed. Constitutional: She is oriented to person, place, and time. She appears well-developed and well-nourished. No distress.  HENT:  Head: Normocephalic and atraumatic.  Mouth/Throat: Oropharynx is clear and moist. No oropharyngeal exudate.  Eyes: Conjunctivae are normal. Right eye exhibits no discharge. Left eye exhibits no discharge. No scleral icterus.  Neck: Normal range of motion. Neck supple. No JVD present. No tracheal deviation present. No thyromegaly present.  Cardiovascular: Normal rate, regular rhythm, normal heart sounds and intact distal pulses.  Exam reveals no gallop and no friction rub.   No murmur heard. Pulmonary/Chest: Effort normal and breath sounds normal. No stridor. No respiratory distress. She has no wheezes. She has no rales. She exhibits no tenderness.  Abdominal: Soft. Bowel sounds are normal. She exhibits no distension and no mass. There is no tenderness. There is no rebound and no guarding.  Musculoskeletal: Normal range of motion. She exhibits no edema and no tenderness.  Lymphadenopathy:    She has no cervical adenopathy.  Neurological: She is alert and oriented to person, place, and time. She has normal strength. She displays no atrophy, no tremor and normal reflexes. No cranial nerve deficit or sensory deficit. She exhibits normal muscle tone. She displays a negative Romberg sign. She displays no seizure activity. Coordination and gait normal.  Reflex Scores:      Tricep reflexes are 1+ on the right side and 1+ on the left side.      Bicep reflexes are 1+ on the right side and 1+ on the left side.      Brachioradialis reflexes  are 1+ on the right side and 1+ on the left side.      Patellar reflexes are 1+ on the right  side and 1+ on the left side.      Achilles reflexes are 1+ on the right side and 1+ on the left side. Neg SLR in BLE  Skin: Skin is warm and dry. No rash noted. She is not diaphoretic. No erythema. No pallor.  Psychiatric: She has a normal mood and affect. Her behavior is normal. Judgment and thought content normal.      Lab Results  Component Value Date   WBC 10.0 03/08/2013   HGB 11.8* 03/08/2013   HCT 37.4 03/08/2013   PLT 98* 03/08/2013   GLUCOSE 141* 07/27/2013   CHOL 113 12/21/2012   TRIG 59.0 12/21/2012   HDL 40.60 12/21/2012   LDLCALC 61 12/21/2012   ALT 33 04/12/2013   AST 66* 04/12/2013   NA 132* 07/27/2013   K 3.3* 07/27/2013   CL 96 07/27/2013   CREATININE 0.7 07/27/2013   BUN 5* 07/27/2013   CO2 31 07/27/2013   TSH 3.57 07/27/2013   INR 1.44 03/05/2013   HGBA1C 5.7 07/27/2013      Assessment & Plan:

## 2013-08-17 NOTE — Progress Notes (Signed)
Pre visit review using our clinic review tool, if applicable. No additional management support is needed unless otherwise documented below in the visit note. 

## 2013-08-19 ENCOUNTER — Encounter: Payer: Self-pay | Admitting: Internal Medicine

## 2013-08-19 NOTE — Assessment & Plan Note (Signed)
She will continue norco for pain She tells me that she will be seen next week at a pain clinic in [facility] - I await that consult

## 2013-08-19 NOTE — Assessment & Plan Note (Signed)
She will continue sertraline and xanax

## 2013-08-19 NOTE — Assessment & Plan Note (Signed)
Her BP is well controlled 

## 2013-08-22 ENCOUNTER — Telehealth: Payer: Self-pay | Admitting: *Deleted

## 2013-08-22 DIAGNOSIS — M5416 Radiculopathy, lumbar region: Secondary | ICD-10-CM

## 2013-08-22 MED ORDER — HYDROCODONE-ACETAMINOPHEN 10-325 MG PO TABS: 1.0000 | ORAL_TABLET | Freq: Four times a day (QID) | ORAL | Status: DC | PRN

## 2013-08-22 NOTE — Telephone Encounter (Signed)
done

## 2013-08-22 NOTE — Telephone Encounter (Signed)
Spoke with pt advised of Mds message

## 2013-08-22 NOTE — Telephone Encounter (Signed)
Pt called states she was seen at [facility] in [facility] today.  Further states she has to return on 12.3.14, they will not prescribe any medication until after that date.  Pt is requesting a refill on Hydrocodone to last til then.  Please advise

## 2013-11-22 ENCOUNTER — Other Ambulatory Visit: Payer: Self-pay | Admitting: Internal Medicine

## 2013-11-22 DIAGNOSIS — C220 Liver cell carcinoma: Secondary | ICD-10-CM

## 2013-11-30 ENCOUNTER — Other Ambulatory Visit: Payer: Self-pay | Admitting: Internal Medicine

## 2013-12-03 ENCOUNTER — Ambulatory Visit
Admission: RE | Admit: 2013-12-03 | Discharge: 2013-12-03 | Disposition: A | Discharge status: Home / Self Care | Payer: Medicare Other | Source: Ambulatory Visit | Attending: Internal Medicine | Admitting: Internal Medicine

## 2013-12-03 DIAGNOSIS — C220 Liver cell carcinoma: Secondary | ICD-10-CM

## 2013-12-03 MED ORDER — GADOXETATE DISODIUM 0.25 MMOL/ML IV SOLN
6.0000 mL | Freq: Once | INTRAVENOUS | Status: AC | PRN
  Administered 2013-12-03: 6 mL via INTRAVENOUS

## 2013-12-21 ENCOUNTER — Ambulatory Visit: Payer: Medicare Other | Admitting: Internal Medicine

## 2014-01-07 ENCOUNTER — Other Ambulatory Visit (INDEPENDENT_AMBULATORY_CARE_PROVIDER_SITE_OTHER): Payer: Medicare Other

## 2014-01-07 ENCOUNTER — Encounter: Payer: Self-pay | Admitting: Internal Medicine

## 2014-01-07 ENCOUNTER — Ambulatory Visit (INDEPENDENT_AMBULATORY_CARE_PROVIDER_SITE_OTHER): Payer: Medicare Other | Admitting: Internal Medicine

## 2014-01-07 VITALS — BP 122/68 | HR 88 | Temp 98.0°F | Resp 16 | Ht 66.0 in | Wt 144.8 lb

## 2014-01-07 DIAGNOSIS — B1710 Acute hepatitis C without hepatic coma: Secondary | ICD-10-CM

## 2014-01-07 DIAGNOSIS — E876 Hypokalemia: Secondary | ICD-10-CM

## 2014-01-07 DIAGNOSIS — E039 Hypothyroidism, unspecified: Secondary | ICD-10-CM

## 2014-01-07 DIAGNOSIS — E785 Hyperlipidemia, unspecified: Secondary | ICD-10-CM | POA: Insufficient documentation

## 2014-01-07 DIAGNOSIS — F418 Other specified anxiety disorders: Secondary | ICD-10-CM

## 2014-01-07 DIAGNOSIS — I10 Essential (primary) hypertension: Secondary | ICD-10-CM

## 2014-01-07 DIAGNOSIS — F341 Dysthymic disorder: Secondary | ICD-10-CM

## 2014-01-07 DIAGNOSIS — I2510 Atherosclerotic heart disease of native coronary artery without angina pectoris: Secondary | ICD-10-CM

## 2014-01-07 LAB — BASIC METABOLIC PANEL
BUN: 8 mg/dL (ref 6–23)
CHLORIDE: 96 meq/L (ref 96–112)
CO2: 33 mEq/L — ABNORMAL HIGH (ref 19–32)
CREATININE: 0.7 mg/dL (ref 0.4–1.2)
Calcium: 9 mg/dL (ref 8.4–10.5)
GFR: 117.7 mL/min (ref >60.00)
GLUCOSE: 67 mg/dL — AB (ref 70–99)
POTASSIUM: 3.4 meq/L — AB (ref 3.5–5.1)
Sodium: 134 mEq/L — ABNORMAL LOW (ref 135–145)

## 2014-01-07 LAB — LIPID PANEL
CHOL/HDL RATIO: 3
Cholesterol: 104 mg/dL (ref 0–200)
HDL: 39.8 mg/dL (ref >39.00)
LDL Cholesterol: 56 mg/dL (ref 0–99)
Triglycerides: 42 mg/dL (ref 0.0–149.0)
VLDL: 8.4 mg/dL (ref 0.0–40.0)

## 2014-01-07 LAB — TSH: TSH: 1.83 u[IU]/mL (ref 0.35–5.50)

## 2014-01-07 LAB — MAGNESIUM: MAGNESIUM: 1.6 mg/dL (ref 1.5–2.5)

## 2014-01-07 MED ORDER — ALPRAZOLAM 0.5 MG PO TABS: ORAL_TABLET | ORAL | Status: DC

## 2014-01-07 MED ORDER — METOPROLOL TARTRATE 25 MG PO TABS: 25.0000 mg | ORAL_TABLET | Freq: Two times a day (BID) | ORAL | Status: DC

## 2014-01-07 NOTE — Patient Instructions (Signed)
Hypothyroidism The thyroid is a large gland located in the lower front of your neck. The thyroid gland helps control metabolism. Metabolism is how your body handles food. It controls metabolism with the hormone thyroxine. When this gland is underactive (hypothyroid), it produces too little hormone.  CAUSES These include:   Absence or destruction of thyroid tissue.  Goiter due to iodine deficiency.  Goiter due to medications.  Congenital defects (since birth).  Problems with the pituitary. This causes a lack of TSH (thyroid stimulating hormone). This hormone tells the thyroid to turn out more hormone. SYMPTOMS  Lethargy (feeling as though you have no energy)  Cold intolerance  Weight gain (in spite of normal food intake)  Dry skin  Coarse hair  Menstrual irregularity (if severe, may lead to infertility)  Slowing of thought processes Cardiac problems are also caused by insufficient amounts of thyroid hormone. Hypothyroidism in the newborn is cretinism, and is an extreme form. It is important that this form be treated adequately and immediately or it will lead rapidly to retarded physical and mental development. DIAGNOSIS  To prove hypothyroidism, your caregiver may do blood tests and ultrasound tests. Sometimes the signs are hidden. It may be necessary for your caregiver to watch this illness with blood tests either before or after diagnosis and treatment. TREATMENT  Low levels of thyroid hormone are increased by using synthetic thyroid hormone. This is a safe, effective treatment. It usually takes about four weeks to gain the full effects of the medication. After you have the full effect of the medication, it will generally take another four weeks for problems to leave. Your caregiver may start you on low doses. If you have had heart problems the dose may be gradually increased. It is generally not an emergency to get rapidly to normal. HOME CARE INSTRUCTIONS   Take your  medications as your caregiver suggests. Let your caregiver know of any medications you are taking or start taking. Your caregiver will help you with dosage schedules.  As your condition improves, your dosage needs may increase. It will be necessary to have continuing blood tests as suggested by your caregiver.  Report all suspected medication side effects to your caregiver. SEEK MEDICAL CARE IF: Seek medical care if you develop:  Sweating.  Tremulousness (tremors).  Anxiety.  Rapid weight loss.  Heat intolerance.  Emotional swings.  Diarrhea.  Weakness. SEEK IMMEDIATE MEDICAL CARE IF:  You develop chest pain, an irregular heart beat (palpitations), or a rapid heart beat. MAKE SURE YOU:   Understand these instructions.  Will watch your condition.  Will get help right away if you are not doing well or get worse. Document Released: 09/20/2005 Document Revised: 12/13/2011 Document Reviewed: 05/10/2008 [facility] Patient Information 2014 [place].

## 2014-01-07 NOTE — Progress Notes (Signed)
Subjective:    Patient ID: Phyllis [name], female    DOB: 1956/01/24, 58 y.o.   MRN: [id number]  Hypertension This is a chronic problem. The current episode started more than 1 year ago. The problem has been gradually improving since onset. The problem is controlled. Associated symptoms include anxiety. Pertinent negatives include no blurred vision, chest pain, headaches, malaise/fatigue, neck pain, orthopnea, palpitations, peripheral edema, PND, shortness of breath or sweats. There are no associated agents to hypertension. Past treatments include angiotensin blockers and beta blockers. The current treatment provides significant improvement. There are no compliance problems.       Review of Systems  Constitutional: Negative.  Negative for fever, chills, malaise/fatigue, diaphoresis, appetite change and fatigue.  HENT: Negative.   Eyes: Negative.  Negative for blurred vision.  Respiratory: Negative.  Negative for cough, choking, chest tightness, shortness of breath, wheezing and stridor.   Cardiovascular: Negative.  Negative for chest pain, palpitations, orthopnea, leg swelling and PND.  Gastrointestinal: Negative.  Negative for nausea, vomiting, abdominal pain, diarrhea, constipation and blood in stool.  Endocrine: Negative.  Negative for polydipsia, polyphagia and polyuria.  Genitourinary: Negative.  Negative for dysuria, urgency, hematuria, flank pain, enuresis and difficulty urinating.  Musculoskeletal: Negative.  Negative for arthralgias, back pain, myalgias, neck pain and neck stiffness.  Skin: Negative.   Allergic/Immunologic: Negative.   Neurological: Negative.  Negative for headaches.  Hematological: Negative.  Negative for adenopathy. Does not bruise/bleed easily.  Psychiatric/Behavioral: Negative for suicidal ideas, hallucinations, behavioral problems, confusion, sleep disturbance, self-injury, dysphoric mood, decreased concentration and agitation. The patient is  nervous/anxious. The patient is not hyperactive.        Objective:   Physical Exam  Vitals reviewed. Constitutional: She is oriented to person, place, and time. She appears well-developed and well-nourished. No distress.  HENT:  Head: Normocephalic and atraumatic.  Mouth/Throat: Oropharynx is clear and moist. No oropharyngeal exudate.  Eyes: Conjunctivae are normal. Right eye exhibits no discharge. Left eye exhibits no discharge. No scleral icterus.  Neck: Normal range of motion. Neck supple. No JVD present. No tracheal deviation present. No thyromegaly present.  Cardiovascular: Normal rate, regular rhythm, normal heart sounds and intact distal pulses.  Exam reveals no gallop and no friction rub.   No murmur heard. Pulmonary/Chest: Effort normal and breath sounds normal. No stridor. No respiratory distress. She has no wheezes. She has no rales. She exhibits no tenderness.  Abdominal: Soft. Bowel sounds are normal. She exhibits no distension and no mass. There is no tenderness. There is no rebound and no guarding.  Musculoskeletal: Normal range of motion. She exhibits no edema and no tenderness.  Lymphadenopathy:    She has no cervical adenopathy.  Neurological: She is oriented to person, place, and time.  Skin: Skin is warm and dry. No rash noted. She is not diaphoretic. No erythema. No pallor.     Lab Results  Component Value Date   WBC 10.0 03/08/2013   HGB 11.8* 03/08/2013   HCT 37.4 03/08/2013   PLT 98* 03/08/2013   GLUCOSE 141* 07/27/2013   CHOL 113 12/21/2012   TRIG 59.0 12/21/2012   HDL 40.60 12/21/2012   LDLCALC 61 12/21/2012   ALT 33 04/12/2013   AST 66* 04/12/2013   NA 132* 07/27/2013   K 3.3* 07/27/2013   CL 96 07/27/2013   CREATININE 0.7 07/27/2013   BUN 5* 07/27/2013   CO2 31 07/27/2013   TSH 3.57 07/27/2013   INR 1.44 03/05/2013   HGBA1C 5.7 07/27/2013  Assessment & Plan:

## 2014-01-07 NOTE — Progress Notes (Signed)
Pre visit review using our clinic review tool, if applicable. No additional management support is needed unless otherwise documented below in the visit note. 

## 2014-01-08 ENCOUNTER — Encounter: Payer: Self-pay | Admitting: Internal Medicine

## 2014-01-08 NOTE — Assessment & Plan Note (Signed)
Her TSH is on the normal range, she will stay on the same dose for now

## 2014-01-08 NOTE — Assessment & Plan Note (Signed)
I will recheck her K+ and Mg++ levels today and will address if needed

## 2014-01-08 NOTE — Assessment & Plan Note (Signed)
Her BP is well controlled I will monitor her lytes and renal function today

## 2014-01-08 NOTE — Assessment & Plan Note (Signed)
She will cont xanax as needed and will stay on zoloft

## 2014-01-09 ENCOUNTER — Telehealth: Payer: Self-pay | Admitting: Internal Medicine

## 2014-01-09 NOTE — Telephone Encounter (Signed)
Relevant patient education assigned to patient using Emmi.

## 2014-02-06 ENCOUNTER — Other Ambulatory Visit: Payer: Self-pay | Admitting: Internal Medicine

## 2014-02-21 ENCOUNTER — Inpatient Hospital Stay (HOSPITAL_COMMUNITY): Payer: Medicare Other

## 2014-02-21 ENCOUNTER — Emergency Department (HOSPITAL_COMMUNITY): Payer: Medicare Other

## 2014-02-21 ENCOUNTER — Inpatient Hospital Stay (HOSPITAL_COMMUNITY)
Admission: EM | Admit: 2014-02-21 | Discharge: 2014-02-23 | DRG: 918 | Disposition: A | Discharge status: Home / Self Care | Payer: Medicare Other | Attending: Family Medicine | Admitting: Family Medicine

## 2014-02-21 ENCOUNTER — Encounter (HOSPITAL_COMMUNITY): Payer: Self-pay | Admitting: Emergency Medicine

## 2014-02-21 DIAGNOSIS — R4182 Altered mental status, unspecified: Secondary | ICD-10-CM | POA: Diagnosis present

## 2014-02-21 DIAGNOSIS — T43591A Poisoning by other antipsychotics and neuroleptics, accidental (unintentional), initial encounter: Secondary | ICD-10-CM | POA: Diagnosis present

## 2014-02-21 DIAGNOSIS — Z885 Allergy status to narcotic agent status: Secondary | ICD-10-CM

## 2014-02-21 DIAGNOSIS — F17200 Nicotine dependence, unspecified, uncomplicated: Secondary | ICD-10-CM | POA: Diagnosis present

## 2014-02-21 DIAGNOSIS — F341 Dysthymic disorder: Secondary | ICD-10-CM

## 2014-02-21 DIAGNOSIS — T424X4A Poisoning by benzodiazepines, undetermined, initial encounter: Principal | ICD-10-CM | POA: Diagnosis present

## 2014-02-21 DIAGNOSIS — E876 Hypokalemia: Secondary | ICD-10-CM | POA: Diagnosis present

## 2014-02-21 DIAGNOSIS — J45909 Unspecified asthma, uncomplicated: Secondary | ICD-10-CM | POA: Diagnosis present

## 2014-02-21 DIAGNOSIS — K219 Gastro-esophageal reflux disease without esophagitis: Secondary | ICD-10-CM | POA: Diagnosis present

## 2014-02-21 DIAGNOSIS — R911 Solitary pulmonary nodule: Secondary | ICD-10-CM | POA: Diagnosis present

## 2014-02-21 DIAGNOSIS — M810 Age-related osteoporosis without current pathological fracture: Secondary | ICD-10-CM | POA: Diagnosis present

## 2014-02-21 DIAGNOSIS — F1320 Sedative, hypnotic or anxiolytic dependence, uncomplicated: Secondary | ICD-10-CM

## 2014-02-21 DIAGNOSIS — F19939 Other psychoactive substance use, unspecified with withdrawal, unspecified: Secondary | ICD-10-CM | POA: Diagnosis present

## 2014-02-21 DIAGNOSIS — B182 Chronic viral hepatitis C: Secondary | ICD-10-CM | POA: Diagnosis present

## 2014-02-21 DIAGNOSIS — E871 Hypo-osmolality and hyponatremia: Secondary | ICD-10-CM | POA: Diagnosis present

## 2014-02-21 DIAGNOSIS — Z79899 Other long term (current) drug therapy: Secondary | ICD-10-CM | POA: Diagnosis not present

## 2014-02-21 DIAGNOSIS — Z791 Long term (current) use of non-steroidal anti-inflammatories (NSAID): Secondary | ICD-10-CM | POA: Diagnosis not present

## 2014-02-21 DIAGNOSIS — Z888 Allergy status to other drugs, medicaments and biological substances status: Secondary | ICD-10-CM

## 2014-02-21 DIAGNOSIS — J209 Acute bronchitis, unspecified: Secondary | ICD-10-CM

## 2014-02-21 DIAGNOSIS — L259 Unspecified contact dermatitis, unspecified cause: Secondary | ICD-10-CM | POA: Diagnosis present

## 2014-02-21 DIAGNOSIS — J440 Chronic obstructive pulmonary disease with acute lower respiratory infection: Secondary | ICD-10-CM

## 2014-02-21 DIAGNOSIS — K861 Other chronic pancreatitis: Secondary | ICD-10-CM | POA: Diagnosis present

## 2014-02-21 DIAGNOSIS — G8929 Other chronic pain: Secondary | ICD-10-CM | POA: Diagnosis present

## 2014-02-21 DIAGNOSIS — E039 Hypothyroidism, unspecified: Secondary | ICD-10-CM | POA: Diagnosis present

## 2014-02-21 DIAGNOSIS — Z9089 Acquired absence of other organs: Secondary | ICD-10-CM

## 2014-02-21 DIAGNOSIS — I2510 Atherosclerotic heart disease of native coronary artery without angina pectoris: Secondary | ICD-10-CM | POA: Diagnosis present

## 2014-02-21 DIAGNOSIS — M549 Dorsalgia, unspecified: Secondary | ICD-10-CM | POA: Diagnosis present

## 2014-02-21 DIAGNOSIS — Z7982 Long term (current) use of aspirin: Secondary | ICD-10-CM | POA: Diagnosis not present

## 2014-02-21 DIAGNOSIS — R410 Disorientation, unspecified: Secondary | ICD-10-CM

## 2014-02-21 DIAGNOSIS — F418 Other specified anxiety disorders: Secondary | ICD-10-CM

## 2014-02-21 DIAGNOSIS — F13239 Sedative, hypnotic or anxiolytic dependence with withdrawal, unspecified: Secondary | ICD-10-CM

## 2014-02-21 DIAGNOSIS — E440 Moderate protein-calorie malnutrition: Secondary | ICD-10-CM | POA: Diagnosis present

## 2014-02-21 DIAGNOSIS — B1710 Acute hepatitis C without hepatic coma: Secondary | ICD-10-CM

## 2014-02-21 DIAGNOSIS — Z8249 Family history of ischemic heart disease and other diseases of the circulatory system: Secondary | ICD-10-CM

## 2014-02-21 DIAGNOSIS — I10 Essential (primary) hypertension: Secondary | ICD-10-CM | POA: Diagnosis present

## 2014-02-21 DIAGNOSIS — F13939 Sedative, hypnotic or anxiolytic use, unspecified with withdrawal, unspecified: Secondary | ICD-10-CM

## 2014-02-21 DIAGNOSIS — T424X1A Poisoning by benzodiazepines, accidental (unintentional), initial encounter: Secondary | ICD-10-CM | POA: Diagnosis present

## 2014-02-21 DIAGNOSIS — M5416 Radiculopathy, lumbar region: Secondary | ICD-10-CM

## 2014-02-21 LAB — CBC WITH DIFFERENTIAL/PLATELET
BASOS PCT: 0 % (ref 0–1)
Basophils Absolute: 0 10*3/uL (ref 0.0–0.1)
Basophils Absolute: 0 10*3/uL (ref 0.0–0.1)
Basophils Relative: 0 % (ref 0–1)
EOS PCT: 0 % (ref 0–5)
Eosinophils Absolute: 0.1 10*3/uL (ref 0.0–0.7)
Eosinophils Absolute: 0.1 10*3/uL (ref 0.0–0.7)
Eosinophils Relative: 1 % (ref 0–5)
HCT: 46.3 % — ABNORMAL HIGH (ref 36.0–46.0)
HCT: 44.7 % (ref 36.0–46.0)
HEMOGLOBIN: 16.4 g/dL — AB (ref 12.0–15.0)
Hemoglobin: 15.9 g/dL — ABNORMAL HIGH (ref 12.0–15.0)
LYMPHS ABS: 3.6 10*3/uL (ref 0.7–4.0)
LYMPHS PCT: 32 % (ref 12–46)
Lymphocytes Relative: 37 % (ref 12–46)
Lymphs Abs: 3.9 10*3/uL (ref 0.7–4.0)
MCH: 31.5 pg (ref 26.0–34.0)
MCH: 31.7 pg (ref 26.0–34.0)
MCHC: 35.4 g/dL (ref 30.0–36.0)
MCHC: 35.6 g/dL (ref 30.0–36.0)
MCV: 89 fL (ref 78.0–100.0)
MCV: 89.2 fL (ref 78.0–100.0)
MONO ABS: 1.1 10*3/uL — AB (ref 0.1–1.0)
Monocytes Absolute: 1 10*3/uL (ref 0.1–1.0)
Monocytes Relative: 10 % (ref 3–12)
Monocytes Relative: 10 % (ref 3–12)
NEUTROS PCT: 53 % (ref 43–77)
Neutro Abs: 6.6 10*3/uL (ref 1.7–7.7)
Neutro Abs: 5.6 10*3/uL (ref 1.7–7.7)
Neutrophils Relative %: 58 % (ref 43–77)
Platelets: 159 10*3/uL (ref 150–400)
Platelets: 120 10*3/uL — ABNORMAL LOW (ref 150–400)
RBC: 5.2 MIL/uL — ABNORMAL HIGH (ref 3.87–5.11)
RBC: 5.01 MIL/uL (ref 3.87–5.11)
RDW: 12.4 % (ref 11.5–15.5)
RDW: 12.5 % (ref 11.5–15.5)
WBC: 11.3 10*3/uL — AB (ref 4.0–10.5)
WBC: 10.6 10*3/uL — ABNORMAL HIGH (ref 4.0–10.5)

## 2014-02-21 LAB — PROTIME-INR
INR: 1.33 (ref 0.00–1.49)
PROTHROMBIN TIME: 16.2 s — AB (ref 11.6–15.2)

## 2014-02-21 LAB — COMPREHENSIVE METABOLIC PANEL
ALT: 20 U/L (ref 0–35)
AST: 39 U/L — ABNORMAL HIGH (ref 0–37)
Albumin: 3.6 g/dL (ref 3.5–5.2)
Alkaline Phosphatase: 80 U/L (ref 39–117)
BUN: 8 mg/dL (ref 6–23)
CALCIUM: 9.8 mg/dL (ref 8.4–10.5)
CO2: 26 meq/L (ref 19–32)
Chloride: 86 mEq/L — ABNORMAL LOW (ref 96–112)
Creatinine, Ser: 0.97 mg/dL (ref 0.50–1.10)
GFR, EST AFRICAN AMERICAN: 73 mL/min — AB (ref >90)
GFR, EST NON AFRICAN AMERICAN: 63 mL/min — AB (ref >90)
Glucose, Bld: 119 mg/dL — ABNORMAL HIGH (ref 70–99)
Potassium: 2.4 mEq/L — CL (ref 3.7–5.3)
SODIUM: 127 meq/L — AB (ref 137–147)
Total Bilirubin: 1.4 mg/dL — ABNORMAL HIGH (ref 0.3–1.2)
Total Protein: 7.9 g/dL (ref 6.0–8.3)

## 2014-02-21 LAB — URINALYSIS, ROUTINE W REFLEX MICROSCOPIC
Glucose, UA: NEGATIVE mg/dL
Hgb urine dipstick: NEGATIVE
Ketones, ur: NEGATIVE mg/dL
LEUKOCYTES UA: NEGATIVE
NITRITE: NEGATIVE
PH: 6.5 (ref 5.0–8.0)
Protein, ur: NEGATIVE mg/dL
Specific Gravity, Urine: 1.018 (ref 1.005–1.030)
Urobilinogen, UA: 2 mg/dL — ABNORMAL HIGH (ref 0.0–1.0)

## 2014-02-21 LAB — RAPID URINE DRUG SCREEN, HOSP PERFORMED
Amphetamines: NOT DETECTED
BENZODIAZEPINES: NOT DETECTED
Barbiturates: NOT DETECTED
Cocaine: NOT DETECTED
Opiates: POSITIVE — AB
Tetrahydrocannabinol: NOT DETECTED

## 2014-02-21 LAB — I-STAT TROPONIN, ED: Troponin i, poc: 0.06 ng/mL (ref 0.00–0.08)

## 2014-02-21 LAB — AMMONIA: Ammonia: 38 umol/L (ref 11–60)

## 2014-02-21 LAB — SALICYLATE LEVEL

## 2014-02-21 LAB — APTT: APTT: 28 s (ref 24–37)

## 2014-02-21 LAB — TSH: TSH: 3.97 u[IU]/mL (ref 0.350–4.500)

## 2014-02-21 LAB — I-STAT CG4 LACTIC ACID, ED: LACTIC ACID, VENOUS: 3.21 mmol/L — AB (ref 0.5–2.2)

## 2014-02-21 LAB — PHOSPHORUS: Phosphorus: 3 mg/dL (ref 2.3–4.6)

## 2014-02-21 LAB — MAGNESIUM: MAGNESIUM: 1.4 mg/dL — AB (ref 1.5–2.5)

## 2014-02-21 LAB — ACETAMINOPHEN LEVEL: Acetaminophen (Tylenol), Serum: <15 ug/mL (ref 10–30)

## 2014-02-21 LAB — ETHANOL

## 2014-02-21 MED ORDER — ONDANSETRON HCL 4 MG PO TABS: 4.0000 mg | ORAL_TABLET | Freq: Four times a day (QID) | ORAL | Status: DC | PRN

## 2014-02-21 MED ORDER — LORAZEPAM 2 MG/ML IJ SOLN
0.5000 mg | Freq: Once | INTRAMUSCULAR | Status: AC
  Administered 2014-02-21: 0.5 mg via INTRAVENOUS
  Filled 2014-02-21: qty 1

## 2014-02-21 MED ORDER — POTASSIUM CHLORIDE IN NACL 20-0.9 MEQ/L-% IV SOLN
INTRAVENOUS | Status: AC
  Administered 2014-02-21: 12:00:00 via INTRAVENOUS
  Filled 2014-02-21: qty 1000

## 2014-02-21 MED ORDER — LEDIPASVIR-SOFOSBUVIR 90-400 MG PO TABS
1.0000 | ORAL_TABLET | Freq: Every day | ORAL | Status: DC
  Administered 2014-02-22: 1 mg via ORAL
  Administered 2014-02-23: 1 via ORAL

## 2014-02-21 MED ORDER — LEVOTHYROXINE SODIUM 25 MCG PO TABS
25.0000 ug | ORAL_TABLET | Freq: Every day | ORAL | Status: DC
  Administered 2014-02-21 – 2014-02-23 (×3): 25 ug via ORAL
  Filled 2014-02-21 (×5): qty 1

## 2014-02-21 MED ORDER — POTASSIUM CHLORIDE 10 MEQ/100ML IV SOLN
10.0000 meq | Freq: Once | INTRAVENOUS | Status: AC
  Administered 2014-02-21: 10 meq via INTRAVENOUS
  Filled 2014-02-21: qty 100

## 2014-02-21 MED ORDER — ACETAMINOPHEN 325 MG PO TABS
650.0000 mg | ORAL_TABLET | Freq: Four times a day (QID) | ORAL | Status: DC | PRN
  Administered 2014-02-21: 650 mg via ORAL
  Filled 2014-02-21: qty 2

## 2014-02-21 MED ORDER — POTASSIUM CHLORIDE CRYS ER 20 MEQ PO TBCR
40.0000 meq | EXTENDED_RELEASE_TABLET | Freq: Once | ORAL | Status: AC
  Administered 2014-02-21: 40 meq via ORAL
  Filled 2014-02-21: qty 2

## 2014-02-21 MED ORDER — SODIUM CHLORIDE 0.9 % IV BOLUS (SEPSIS)
1000.0000 mL | Freq: Once | INTRAVENOUS | Status: AC
  Administered 2014-02-21: 1000 mL via INTRAVENOUS

## 2014-02-21 MED ORDER — VENLAFAXINE HCL 37.5 MG PO TABS
37.5000 mg | ORAL_TABLET | Freq: Two times a day (BID) | ORAL | Status: DC
  Administered 2014-02-21 – 2014-02-23 (×4): 37.5 mg via ORAL
  Filled 2014-02-21 (×6): qty 1

## 2014-02-21 MED ORDER — ASPIRIN EC 81 MG PO TBEC
81.0000 mg | DELAYED_RELEASE_TABLET | Freq: Every day | ORAL | Status: DC
  Administered 2014-02-21 – 2014-02-23 (×3): 81 mg via ORAL
  Filled 2014-02-21 (×3): qty 1

## 2014-02-21 MED ORDER — MAGNESIUM SULFATE 4000MG/100ML IJ SOLN
4.0000 g | Freq: Once | INTRAMUSCULAR | Status: AC
  Administered 2014-02-21: 4 g via INTRAVENOUS
  Filled 2014-02-21: qty 100

## 2014-02-21 MED ORDER — METOPROLOL TARTRATE 25 MG PO TABS
25.0000 mg | ORAL_TABLET | Freq: Two times a day (BID) | ORAL | Status: DC
  Administered 2014-02-21 – 2014-02-23 (×4): 25 mg via ORAL
  Filled 2014-02-21 (×6): qty 1

## 2014-02-21 MED ORDER — SODIUM CHLORIDE 0.9 % IJ SOLN
3.0000 mL | Freq: Two times a day (BID) | INTRAMUSCULAR | Status: DC
  Administered 2014-02-21 – 2014-02-22 (×2): 3 mL via INTRAVENOUS

## 2014-02-21 MED ORDER — ACETAMINOPHEN 650 MG RE SUPP: 650.0000 mg | Freq: Four times a day (QID) | RECTAL | Status: DC | PRN

## 2014-02-21 MED ORDER — POTASSIUM CHLORIDE 10 MEQ/100ML IV SOLN
10.0000 meq | INTRAVENOUS | Status: AC
  Administered 2014-02-21 (×3): 10 meq via INTRAVENOUS
  Filled 2014-02-21 (×2): qty 100

## 2014-02-21 MED ORDER — IOHEXOL 300 MG/ML  SOLN
80.0000 mL | Freq: Once | INTRAMUSCULAR | Status: AC | PRN
  Administered 2014-02-21: 80 mL via INTRAVENOUS

## 2014-02-21 MED ORDER — SERTRALINE HCL 50 MG PO TABS
50.0000 mg | ORAL_TABLET | Freq: Every day | ORAL | Status: DC
  Administered 2014-02-21 – 2014-02-23 (×3): 50 mg via ORAL
  Filled 2014-02-21 (×3): qty 1

## 2014-02-21 MED ORDER — PANTOPRAZOLE SODIUM 40 MG PO TBEC
40.0000 mg | DELAYED_RELEASE_TABLET | Freq: Every day | ORAL | Status: DC
  Administered 2014-02-21 – 2014-02-22 (×2): 40 mg via ORAL
  Filled 2014-02-21 (×2): qty 1

## 2014-02-21 MED ORDER — ONDANSETRON HCL 4 MG/2ML IJ SOLN: 4.0000 mg | Freq: Four times a day (QID) | INTRAMUSCULAR | Status: DC | PRN
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
## 2014-02-21 NOTE — ED Notes (Signed)
Pt returned from CT and x-ray at this time, unable to draw blood or complete EKG during this time.
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
## 2014-02-21 NOTE — ED Notes (Addendum)
Pt continues to be confused, with flight of ideas.

## 2014-02-21 NOTE — ED Notes (Signed)
Pt to CT at this time
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
## 2014-02-21 NOTE — Progress Notes (Signed)
UR completed 

## 2014-02-22 ENCOUNTER — Inpatient Hospital Stay (HOSPITAL_COMMUNITY): Payer: Medicare Other

## 2014-02-22 DIAGNOSIS — R404 Transient alteration of awareness: Secondary | ICD-10-CM

## 2014-02-22 DIAGNOSIS — F19939 Other psychoactive substance use, unspecified with withdrawal, unspecified: Secondary | ICD-10-CM

## 2014-02-22 DIAGNOSIS — E039 Hypothyroidism, unspecified: Secondary | ICD-10-CM

## 2014-02-22 DIAGNOSIS — F1520 Other stimulant dependence, uncomplicated: Secondary | ICD-10-CM

## 2014-02-22 DIAGNOSIS — E440 Moderate protein-calorie malnutrition: Secondary | ICD-10-CM | POA: Insufficient documentation

## 2014-02-22 LAB — URINE CULTURE
COLONY COUNT: NO GROWTH
Culture: NO GROWTH

## 2014-02-22 LAB — COMPREHENSIVE METABOLIC PANEL
ALBUMIN: 3 g/dL — AB (ref 3.5–5.2)
ALT: 18 U/L (ref 0–35)
AST: 37 U/L (ref 0–37)
Alkaline Phosphatase: 65 U/L (ref 39–117)
BUN: 8 mg/dL (ref 6–23)
CO2: 22 mEq/L (ref 19–32)
Calcium: 8.7 mg/dL (ref 8.4–10.5)
Chloride: 94 mEq/L — ABNORMAL LOW (ref 96–112)
Creatinine, Ser: 0.73 mg/dL (ref 0.50–1.10)
GFR calc Af Amer: >90 mL/min (ref >90)
GFR calc non Af Amer: >90 mL/min (ref >90)
Glucose, Bld: 83 mg/dL (ref 70–99)
Potassium: 3.5 mEq/L — ABNORMAL LOW (ref 3.7–5.3)
Sodium: 130 mEq/L — ABNORMAL LOW (ref 137–147)
TOTAL PROTEIN: 6.4 g/dL (ref 6.0–8.3)
Total Bilirubin: 1.4 mg/dL — ABNORMAL HIGH (ref 0.3–1.2)

## 2014-02-22 LAB — CBC
HCT: 38 % (ref 36.0–46.0)
Hemoglobin: 13.5 g/dL (ref 12.0–15.0)
MCH: 31.4 pg (ref 26.0–34.0)
MCHC: 35.5 g/dL (ref 30.0–36.0)
MCV: 88.4 fL (ref 78.0–100.0)
PLATELETS: 138 10*3/uL — AB (ref 150–400)
RBC: 4.3 MIL/uL (ref 3.87–5.11)
RDW: 12.6 % (ref 11.5–15.5)
WBC: 8.2 10*3/uL (ref 4.0–10.5)

## 2014-02-22 LAB — MAGNESIUM: Magnesium: 2.3 mg/dL (ref 1.5–2.5)

## 2014-02-22 LAB — GLUCOSE, CAPILLARY: GLUCOSE-CAPILLARY: 81 mg/dL (ref 70–99)

## 2014-02-22 MED ORDER — ENSURE PUDDING PO PUDG
1.0000 | Freq: Two times a day (BID) | ORAL | Status: DC
  Filled 2014-02-22 (×3): qty 1

## 2014-02-22 MED ORDER — ALPRAZOLAM 0.5 MG PO TABS
0.5000 mg | ORAL_TABLET | Freq: Three times a day (TID) | ORAL | Status: DC | PRN
  Administered 2014-02-22 (×2): 0.5 mg via ORAL
  Filled 2014-02-22 (×2): qty 1

## 2014-02-22 MED ORDER — HYDROCODONE-ACETAMINOPHEN 5-325 MG PO TABS
1.0000 | ORAL_TABLET | ORAL | Status: DC | PRN
  Administered 2014-02-22 – 2014-02-23 (×3): 1 via ORAL
  Filled 2014-02-22 (×3): qty 1

## 2014-02-22 MED ORDER — LORAZEPAM 2 MG/ML IJ SOLN
2.0000 mg | INTRAMUSCULAR | Status: DC | PRN
  Administered 2014-02-22: 2 mg via INTRAVENOUS
  Filled 2014-02-22: qty 1
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
## 2014-02-22 NOTE — Progress Notes (Addendum)
Interventions attempted to reorient and keep patient occupied for safety. Pt continuously attempts to get out of bed. States she is going back to [place]. She was wheeled in wheelchair and put at nurse's station for safety. Also given crayons, pen, and paper to occupy
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
## 2014-02-23 DIAGNOSIS — I10 Essential (primary) hypertension: Secondary | ICD-10-CM

## 2014-02-23 DIAGNOSIS — B1710 Acute hepatitis C without hepatic coma: Secondary | ICD-10-CM

## 2014-02-23 LAB — BASIC METABOLIC PANEL
BUN: 11 mg/dL (ref 6–23)
CALCIUM: 9 mg/dL (ref 8.4–10.5)
CO2: 23 meq/L (ref 19–32)
Chloride: 97 mEq/L (ref 96–112)
Creatinine, Ser: 0.76 mg/dL (ref 0.50–1.10)
GFR calc non Af Amer: >90 mL/min (ref >90)
Glucose, Bld: 78 mg/dL (ref 70–99)
Potassium: 3.5 mEq/L — ABNORMAL LOW (ref 3.7–5.3)
SODIUM: 133 meq/L — AB (ref 137–147)

## 2014-02-23 LAB — GLUCOSE, CAPILLARY: GLUCOSE-CAPILLARY: 79 mg/dL (ref 70–99)

## 2014-02-23 MED ORDER — ALPRAZOLAM 0.5 MG PO TABS: 0.5000 mg | ORAL_TABLET | Freq: Three times a day (TID) | ORAL | Status: DC | PRN

## 2014-02-23 MED ORDER — LOSARTAN POTASSIUM 50 MG PO TABS: 100.0000 mg | ORAL_TABLET | Freq: Every day | ORAL | Status: DC

## 2014-02-23 MED ORDER — HYDROCODONE-ACETAMINOPHEN 10-325 MG PO TABS: 1.0000 | ORAL_TABLET | Freq: Four times a day (QID) | ORAL | Status: DC | PRN

## 2014-02-23 MED ORDER — LOSARTAN POTASSIUM 50 MG PO TABS
100.0000 mg | ORAL_TABLET | Freq: Every day | ORAL | Status: DC
  Filled 2014-02-23: qty 2
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
## 2014-02-25 NOTE — Progress Notes (Signed)
Discharge summary sent to payer through [employer]  

## 2014-02-26 NOTE — ED Provider Notes (Signed)
Medical screening examination/treatment/procedure(s) were conducted as a shared visit with non-physician practitioner(s) and myself.  I personally evaluated the patient during the encounter.   EKG Interpretation   Date/Time:  Thursday Feb 21 2014 07:16:37 EDT Ventricular Rate:  112 PR Interval:  150 QRS Duration: 75 QT Interval:  365 QTC Calculation: 498 R Axis:   52 Text Interpretation:  Sinus tachycardia Atrial premature complexes  Probable left atrial enlargement Minimal ST depression, anterolateral  leads Borderline prolonged QT interval Since last tracing rate faster  Confirmed by Izzak Fries  MD, Dash Cardarelli ([id number]) on 02/21/2014 7:22:33 AM       Pt presents with altered mental status, worsening over the last few days.  Pt ran out of her xanax 1 week ago.  Pt is tremulous, confused, appears to be in withdrawal.  Plan for admission.  Kalman Drape, MD 02/26/14 [phone]

## 2014-05-09 ENCOUNTER — Encounter: Payer: Self-pay | Admitting: Internal Medicine

## 2014-05-09 ENCOUNTER — Other Ambulatory Visit (INDEPENDENT_AMBULATORY_CARE_PROVIDER_SITE_OTHER): Payer: Medicare Other

## 2014-05-09 ENCOUNTER — Ambulatory Visit (INDEPENDENT_AMBULATORY_CARE_PROVIDER_SITE_OTHER): Payer: Medicare Other | Admitting: Internal Medicine

## 2014-05-09 VITALS — BP 142/88 | HR 86 | Temp 97.2°F | Resp 16 | Ht 65.0 in | Wt 142.0 lb

## 2014-05-09 DIAGNOSIS — I10 Essential (primary) hypertension: Secondary | ICD-10-CM

## 2014-05-09 DIAGNOSIS — IMO0002 Reserved for concepts with insufficient information to code with codable children: Secondary | ICD-10-CM

## 2014-05-09 DIAGNOSIS — E876 Hypokalemia: Secondary | ICD-10-CM

## 2014-05-09 DIAGNOSIS — E039 Hypothyroidism, unspecified: Secondary | ICD-10-CM

## 2014-05-09 DIAGNOSIS — M5416 Radiculopathy, lumbar region: Secondary | ICD-10-CM

## 2014-05-09 DIAGNOSIS — K219 Gastro-esophageal reflux disease without esophagitis: Secondary | ICD-10-CM

## 2014-05-09 DIAGNOSIS — B370 Candidal stomatitis: Secondary | ICD-10-CM

## 2014-05-09 DIAGNOSIS — F418 Other specified anxiety disorders: Secondary | ICD-10-CM

## 2014-05-09 DIAGNOSIS — F17200 Nicotine dependence, unspecified, uncomplicated: Secondary | ICD-10-CM

## 2014-05-09 DIAGNOSIS — F341 Dysthymic disorder: Secondary | ICD-10-CM

## 2014-05-09 LAB — BASIC METABOLIC PANEL
BUN: 8 mg/dL (ref 6–23)
CHLORIDE: 94 meq/L — AB (ref 96–112)
CO2: 30 meq/L (ref 19–32)
Calcium: 9 mg/dL (ref 8.4–10.5)
Creatinine, Ser: 0.7 mg/dL (ref 0.4–1.2)
GFR: 113.58 mL/min (ref >60.00)
Glucose, Bld: 64 mg/dL — ABNORMAL LOW (ref 70–99)
POTASSIUM: 3 meq/L — AB (ref 3.5–5.1)
Sodium: 130 mEq/L — ABNORMAL LOW (ref 135–145)

## 2014-05-09 LAB — MAGNESIUM: MAGNESIUM: 2 mg/dL (ref 1.5–2.5)

## 2014-05-09 LAB — TSH: TSH: 1.69 u[IU]/mL (ref 0.35–4.50)

## 2014-05-09 MED ORDER — ALPRAZOLAM 0.5 MG PO TABS
0.5000 mg | ORAL_TABLET | Freq: Three times a day (TID) | ORAL | Status: DC | PRN
Start: 2014-05-09 — End: 2014-08-22

## 2014-05-09 MED ORDER — POTASSIUM CHLORIDE CRYS ER 20 MEQ PO TBCR: 20.0000 meq | EXTENDED_RELEASE_TABLET | Freq: Two times a day (BID) | ORAL | Status: DC

## 2014-05-09 MED ORDER — CLOTRIMAZOLE 10 MG MT TROC: 10.0000 mg | Freq: Every day | OROMUCOSAL | Status: DC

## 2014-05-09 MED ORDER — TRIAMCINOLONE ACETONIDE 0.1 % EX OINT: 1.0000 "application " | TOPICAL_OINTMENT | Freq: Two times a day (BID) | CUTANEOUS | Status: DC

## 2014-05-09 NOTE — Progress Notes (Signed)
Subjective:    Patient ID: Phyllis [name], female    DOB: [birth year], 58 y.o.   MRN: [id number]  Hypertension This is a chronic problem. The current episode started more than 1 year ago. The problem is unchanged. The problem is controlled. Associated symptoms include anxiety. Pertinent negatives include no blurred vision, chest pain, headaches, malaise/fatigue, neck pain, orthopnea, palpitations, peripheral edema, PND, shortness of breath or sweats. Agents associated with hypertension include NSAIDs. Risk factors for coronary artery disease include smoking/tobacco exposure. Past treatments include angiotensin blockers and beta blockers. The current treatment provides moderate improvement. Compliance problems include diet, exercise and psychosocial issues.  Hypertensive end-organ damage includes a thyroid problem.      Review of Systems  Constitutional: Negative.  Negative for fever, chills, malaise/fatigue, diaphoresis, appetite change and fatigue.  HENT: Positive for mouth sores (she has recurrent sores in the corners of her mouth). Negative for congestion, dental problem, drooling, ear discharge, ear pain, facial swelling, hearing loss, nosebleeds, postnasal drip, rhinorrhea, sinus pressure, sore throat, trouble swallowing and voice change.   Eyes: Negative.  Negative for blurred vision.  Respiratory: Negative.  Negative for apnea, cough, choking, chest tightness, shortness of breath, wheezing and stridor.   Cardiovascular: Negative.  Negative for chest pain, palpitations, orthopnea, leg swelling and PND.  Gastrointestinal: Negative.  Negative for nausea, vomiting, abdominal pain, diarrhea, constipation and blood in stool.  Endocrine: Negative.   Genitourinary: Negative.   Musculoskeletal: Positive for back pain. Negative for arthralgias, gait problem, joint swelling, myalgias and neck pain.  Skin: Negative.  Negative for rash.  Allergic/Immunologic: Negative.   Neurological: Negative.   Negative for dizziness, tremors, syncope, weakness, light-headedness, numbness and headaches.  Hematological: Negative.  Negative for adenopathy. Does not bruise/bleed easily.  Psychiatric/Behavioral: Positive for sleep disturbance and dysphoric mood. Negative for suicidal ideas, hallucinations, behavioral problems, confusion, self-injury, decreased concentration and agitation. The patient is nervous/anxious. The patient is not hyperactive.        Objective:   Physical Exam  Vitals reviewed. Constitutional: She is oriented to person, place, and time. She appears well-developed and well-nourished.  Non-toxic appearance. She does not have a sickly appearance. She does not appear ill. No distress.  HENT:  Mouth/Throat: Uvula is midline and oropharynx is clear and moist. Mucous membranes are not pale, not dry and not cyanotic. She does not have dentures. No oral lesions. No trismus in the jaw. Normal dentition. No dental abscesses, uvula swelling, lacerations or dental caries. No oropharyngeal exudate, posterior oropharyngeal edema, posterior oropharyngeal erythema or tonsillar abscesses.    Eyes: Conjunctivae are normal. Right eye exhibits no discharge. Left eye exhibits no discharge. No scleral icterus.  Neck: Normal range of motion. Neck supple. No JVD present. No tracheal deviation present. No thyromegaly present.  Cardiovascular: Normal rate, regular rhythm, normal heart sounds and intact distal pulses.  Exam reveals no gallop and no friction rub.   No murmur heard. Pulmonary/Chest: Effort normal and breath sounds normal. No stridor. No respiratory distress. She has no wheezes. She has no rales. She exhibits no tenderness.  Abdominal: Soft. Bowel sounds are normal. She exhibits no distension and no mass. There is no tenderness. There is no rebound and no guarding.  Musculoskeletal: Normal range of motion. She exhibits no edema and no tenderness.  Lymphadenopathy:    She has no cervical  adenopathy.  Neurological: She is oriented to person, place, and time.  Skin: Skin is warm and dry. No rash noted. She is not diaphoretic. No  erythema. No pallor.  Psychiatric: She has a normal mood and affect. Her speech is normal and behavior is normal. Judgment and thought content normal. Her mood appears not anxious. Her affect is not angry, not blunt, not labile and not inappropriate. Cognition and memory are normal. She does not exhibit a depressed mood. She expresses no homicidal and no suicidal ideation. She expresses no suicidal plans and no homicidal plans.     Lab Results  Component Value Date   WBC 8.2 02/22/2014   HGB 13.5 02/22/2014   HCT 38.0 02/22/2014   PLT 138* 02/22/2014   GLUCOSE 78 02/23/2014   CHOL 104 01/07/2014   TRIG 42.0 01/07/2014   HDL 39.80 01/07/2014   LDLCALC 56 01/07/2014   ALT 18 02/22/2014   AST 37 02/22/2014   NA 133* 02/23/2014   K 3.5* 02/23/2014   CL 97 02/23/2014   CREATININE 0.76 02/23/2014   BUN 11 02/23/2014   CO2 23 02/23/2014   TSH 3.970 02/21/2014   INR 1.33 02/21/2014   HGBA1C 5.7 07/27/2013       Assessment & Plan:

## 2014-05-09 NOTE — Patient Instructions (Signed)

## 2014-05-09 NOTE — Progress Notes (Signed)
Pre visit review using our clinic review tool, if applicable. No additional management support is needed unless otherwise documented below in the visit note. 

## 2014-05-10 ENCOUNTER — Encounter: Payer: Self-pay | Admitting: Internal Medicine

## 2014-05-10 DIAGNOSIS — Z0279 Encounter for issue of other medical certificate: Secondary | ICD-10-CM

## 2014-05-10 NOTE — Assessment & Plan Note (Signed)
Her BP is adequately well controlled

## 2014-05-10 NOTE — Assessment & Plan Note (Signed)
Her TSH is in the normal range, she will stay on the current dose

## 2014-05-10 NOTE — Assessment & Plan Note (Signed)
Will treat with mycelex troche

## 2014-05-10 NOTE — Assessment & Plan Note (Signed)
Her K+ is very low, I have asked her to restart the K+ replacement therapy

## 2014-05-10 NOTE — Assessment & Plan Note (Signed)
She will cont xanax as needed

## 2014-05-10 NOTE — Assessment & Plan Note (Signed)
She will cont norco as needed for pain

## 2014-05-13 ENCOUNTER — Telehealth: Payer: Self-pay | Admitting: Internal Medicine

## 2014-05-13 NOTE — Telephone Encounter (Signed)
Pt came by office to discuss DMV paperwork that was completed by Dr Ronnald Ramp. Pt states she does not understand why Dr Ronnald Ramp says that she does not need to drive? Pt has reviewed the [facility] documents and wants an explanation to Dr Ronnald Ramp decision. Please contact pt when request is reviewed.
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
## 2014-05-14 NOTE — Telephone Encounter (Signed)
There is nothing else for me to say about this

## 2014-07-01 ENCOUNTER — Other Ambulatory Visit: Payer: Self-pay | Admitting: Nurse Practitioner

## 2014-07-01 DIAGNOSIS — C220 Liver cell carcinoma: Secondary | ICD-10-CM

## 2014-07-09 ENCOUNTER — Ambulatory Visit
Admission: RE | Admit: 2014-07-09 | Discharge: 2014-07-09 | Disposition: A | Discharge status: Home / Self Care | Payer: Medicare Other | Source: Ambulatory Visit | Attending: Nurse Practitioner | Admitting: Nurse Practitioner

## 2014-07-09 DIAGNOSIS — C220 Liver cell carcinoma: Secondary | ICD-10-CM

## 2014-07-16 ENCOUNTER — Encounter (HOSPITAL_COMMUNITY): Payer: Self-pay | Admitting: Emergency Medicine

## 2014-07-16 ENCOUNTER — Emergency Department (HOSPITAL_COMMUNITY)
Admission: EM | Admit: 2014-07-16 | Discharge: 2014-07-16 | Disposition: A | Discharge status: Home / Self Care | Payer: Medicare Other | Attending: Emergency Medicine | Admitting: Emergency Medicine

## 2014-07-16 DIAGNOSIS — M549 Dorsalgia, unspecified: Secondary | ICD-10-CM

## 2014-07-16 DIAGNOSIS — Z79899 Other long term (current) drug therapy: Secondary | ICD-10-CM | POA: Insufficient documentation

## 2014-07-16 DIAGNOSIS — Z8619 Personal history of other infectious and parasitic diseases: Secondary | ICD-10-CM | POA: Insufficient documentation

## 2014-07-16 DIAGNOSIS — M545 Low back pain: Secondary | ICD-10-CM | POA: Diagnosis present

## 2014-07-16 DIAGNOSIS — Z872 Personal history of diseases of the skin and subcutaneous tissue: Secondary | ICD-10-CM | POA: Diagnosis not present

## 2014-07-16 DIAGNOSIS — G8929 Other chronic pain: Secondary | ICD-10-CM

## 2014-07-16 DIAGNOSIS — F329 Major depressive disorder, single episode, unspecified: Secondary | ICD-10-CM | POA: Diagnosis not present

## 2014-07-16 DIAGNOSIS — J45901 Unspecified asthma with (acute) exacerbation: Secondary | ICD-10-CM | POA: Diagnosis not present

## 2014-07-16 DIAGNOSIS — Z8669 Personal history of other diseases of the nervous system and sense organs: Secondary | ICD-10-CM | POA: Diagnosis not present

## 2014-07-16 DIAGNOSIS — I10 Essential (primary) hypertension: Secondary | ICD-10-CM | POA: Diagnosis not present

## 2014-07-16 DIAGNOSIS — Z7952 Long term (current) use of systemic steroids: Secondary | ICD-10-CM | POA: Insufficient documentation

## 2014-07-16 DIAGNOSIS — M5416 Radiculopathy, lumbar region: Secondary | ICD-10-CM

## 2014-07-16 DIAGNOSIS — Z7982 Long term (current) use of aspirin: Secondary | ICD-10-CM | POA: Insufficient documentation

## 2014-07-16 DIAGNOSIS — Z9889 Other specified postprocedural states: Secondary | ICD-10-CM | POA: Insufficient documentation

## 2014-07-16 DIAGNOSIS — Z8719 Personal history of other diseases of the digestive system: Secondary | ICD-10-CM | POA: Diagnosis not present

## 2014-07-16 DIAGNOSIS — R202 Paresthesia of skin: Secondary | ICD-10-CM | POA: Insufficient documentation

## 2014-07-16 DIAGNOSIS — E039 Hypothyroidism, unspecified: Secondary | ICD-10-CM | POA: Diagnosis not present

## 2014-07-16 DIAGNOSIS — Z720 Tobacco use: Secondary | ICD-10-CM | POA: Diagnosis not present

## 2014-07-16 MED ORDER — MORPHINE SULFATE 4 MG/ML IJ SOLN
4.0000 mg | Freq: Once | INTRAMUSCULAR | Status: AC
  Administered 2014-07-16: 4 mg via INTRAMUSCULAR
  Filled 2014-07-16: qty 1

## 2014-07-16 NOTE — Discharge Instructions (Signed)
Your chronic back pain needs to be treated by your pain clinic doctor since you have a pain management contract. Use the list below to help find a new pain clinic if you so desire. Continue using all home meds as prescribed and use heat to help with pain, 20 minutes at a time every hour. Use over the counter allergy medications to help with your allergy symptoms and breathing, but see your regular doctor for ongoing management of your intermittent wheezing. TAKE ALL HOME MEDICATIONS FOR YOUR BLOOD PRESSURE! Return to the ER for changes or worsening symptoms as discussed below.  Back Pain:  Your back pain should be treated with medicines such as ibuprofen or aleve and this back pain should get better over the next 2 weeks.  However if you develop severe or worsening pain, low back pain with fever, numbness, weakness or inability to walk or urinate, you should return to the ER immediately.  Please follow up with your doctor this week for a recheck if still having symptoms.  Low back pain is discomfort in the lower back that may be due to injuries to muscles and ligaments around the spine.  Occasionally, it may be caused by a a problem to a part of the spine called a disc.  The pain may last several days or a week;  However, most patients get completely well in 4 weeks.  Self - care:  The application of heat can help soothe the pain.  Maintaining your daily activities, including walking, is encourged, as it will help you get better faster than just staying in bed. Perform gentle stretching as discussed. Drink plenty of fluids.  Medications are also useful to help with pain control.  A commonly prescribed medications includes acetaminophen.  This medication is generally safe, though you should not take more than 6 of the extra strength (559m) pills a day.  Non steroidal anti inflammatory medications including Ibuprofen and naproxen;  These medications help both pain and swelling and are very useful in  treating back pain.  They should be taken with food, as they can cause stomach upset, and more seriously, stomach bleeding.    Muscle relaxants:  These medications can help with muscle tightness that is a cause of lower back pain.  Most of these medications can cause drowsiness, and it is not safe to drive or use dangerous machinery while taking them.  SEEK IMMEDIATE MEDICAL ATTENTION IF: New numbness, tingling, weakness, or problem with the use of your arms or legs.  Severe back pain not relieved with medications.  Difficulty with or loss of control of your bowel or bladder control.  Increasing pain in any areas of the body (such as chest or abdominal pain).  Shortness of breath, dizziness or fainting.  Nausea (feeling sick to your stomach), vomiting, fever, or sweats.  You will need to follow up with  Your primary healthcare provider in 1-2 weeks for reassessment.  If you do not have a doctor see the list below.   Emergency Department Resource Guide 1) Find a Doctor and Pay Out of Pocket Although you won't have to find out who is covered by your insurance plan, it is a good idea to ask around and get recommendations. You will then need to call the office and see if the doctor you have chosen will accept you as a new patient and what types of options they offer for patients who are self-pay. Some doctors offer discounts or will set up payment plans for  their patients who do not have insurance, but you will need to ask so you aren't surprised when you get to your appointment.  2) Contact Your Local Health Department Not all health departments have doctors that can see patients for sick visits, but many do, so it is worth a call to see if yours does. If you don't know where your local health department is, you can check in your phone book. The CDC also has a tool to help you locate your state's health department, and many state websites also have listings of all of their local health  departments.  3) Find a [facility] If your illness is not likely to be very severe or complicated, you may want to try a walk in clinic. These are popping up all over the country in pharmacies, drugstores, and shopping centers. They're usually staffed by nurse practitioners or physician assistants that have been trained to treat common illnesses and complaints. They're usually fairly quick and inexpensive. However, if you have serious medical issues or chronic medical problems, these are probably not your best option.  No Primary Care Doctor: - Call Health Connect at  [phone] - they can help you locate a primary care doctor that  accepts your insurance, provides certain services, etc. - Physician Referral Service- [phone]  Chronic Pain Problems: Organization         Address  Phone   Notes  [facility]  [phone] Patients need to be referred by their primary care doctor.   Medication Assistance: Organization         Address  Phone   Notes  [facility] Medication [facility] [address] [place], [place] [address] [phone] --Must be a resident of [facility] -- Must have NO insurance coverage whatsoever (no Medicaid/ Medicare, etc.) -- The pt. MUST have a primary care doctor that directs their care regularly and follows them in the community   [facility]  [phone]   [employer]  [phone]    Agencies that provide inexpensive medical care: Organization         Address  Phone   Notes  [place]  [phone]   [facility]    [phone]   [facility] [place], [place] [address] [phone]   [place] [address], [place] [phone]   [facility]    [phone]   [facility]    [phone]   [place] [address], [place] Phone:  [phone], Fax:  [phone] Hours of Operation:  9 am - 6 pm, M-F.  Also accepts Medicaid/Medicare and self-pay.  [facility] for [place] [address], [address], [place] Phone: [phone], Fax: [phone]. Hours of Operation:  8:30 am - 5:30 pm, M-F.  Also accepts Medicaid and self-pay.  [facility] [address] [place] Phone: [phone]   [place], [place], [place] [phone], Ext. [phone] Mondays & Thursdays: 7-9 AM.  First 15 patients are seen on a first come, first serve basis.    [place] Providers:  Organization         Address  Phone   Notes  [facility] [address], [place] [phone] Also accepts self-pay patients.  [facility] [address], [place]  [phone]   [place], [address], [place] [phone]   [facility] [address] [place] [phone]   Lucianne Lei [address], [place]   [phone] Only accepts [place] patients after they have their name applied to their card.   Self-Pay (no insurance) in [facility]:  Organization         Address  Phone   Notes  Sickle Cell Patients, [facility] [place] [phone]   [facility] [place] [phone]   [facility] [place]  [address], [place], [place] [phone]   [facility]/Dr. Osei-Bonsu  [address], [place] or [address], [place] [phone] Phone number for both [place] and [place] locations is the same.  [facility] [address] [place] [phone]   [facility] [address] [place] or [address] [phone]   [place] [address], [place] [phone], phone; [phone], fax Sees patients 1st and 3rd Saturday of every month.  Must not qualify for public or private insurance (i.e. Medicaid, Medicare, [place] Health Choice, Veterans' Benefits)  Household income should be no more than 200% of the poverty level The clinic cannot treat you if you are pregnant or think you are pregnant  Sexually transmitted diseases are not treated at the clinic.    Back Exercises Back exercises help treat and prevent back injuries. The goal is to increase your strength in your belly (abdominal) and back muscles. These exercises can also help with flexibility. Start these exercises when told by your doctor. HOME CARE Back exercises include: Pelvic Tilt.  Lie on your back with your knees bent. Tilt your pelvis until the lower part of your back is against the floor. Hold this position 5 to 10 sec. Repeat this exercise 5 to 10 times. Knee to Chest.  Pull 1 knee up against your chest and hold for 20 to 30 seconds. Repeat this with the other knee. This may be done with the other leg straight or bent, whichever feels better. Then, pull both knees up against your chest. Sit-Ups or Curl-Ups.  Bend your knees 90 degrees. Start with tilting your pelvis, and do a partial, slow sit-up. Only lift your upper half 30 to 45 degrees off the floor. Take at least 2 to 3 seonds for each sit-up. Do not do sit-ups with your knees out straight. If partial sit-ups are difficult, simply do the above but with only tightening your belly (abdominal) muscles and holding it as told. Hip-Lift.  Lie on your back with your knees flexed 90 degrees. Push down with your feet and shoulders as you raise your hips 2 inches off the floor. Hold for 10 seconds, repeat 5 to 10 times. Back Arches.  Lie on your stomach. Prop yourself up on bent elbows. Slowly press on your hands, causing an arch in your low  back. Repeat 3 to 5 times. Shoulder-Lifts.  Lie face down with arms beside your body. Keep hips and belly pressed to floor as you slowly lift your head and shoulders off the floor. Do not overdo your exercises. Be careful in the beginning. Exercises may  cause you some mild back discomfort. If the pain lasts for more than 15 minutes, stop the exercises until you see your doctor. Improvement with exercise for back problems is slow.  Document Released: 10/23/2010 Document Revised: 12/13/2011 Document Reviewed: 07/22/2011 [facility] Patient Information 2015 [place], [place]. This information is not intended to replace advice given to you by your health care provider. Make sure you discuss any questions you have with your health care provider.  Chronic Back Pain  When back pain lasts longer than 3 months, it is called chronic back pain.People with chronic back pain often go through certain periods that are more intense (flare-ups).  CAUSES Chronic back pain can be caused by wear and tear (degeneration) on different structures in your back. These structures include:  The bones of your spine (vertebrae) and the joints surrounding your spinal cord and nerve roots (facets).  The strong, fibrous tissues that connect your vertebrae (ligaments). Degeneration of these structures may result in pressure on your nerves. This can lead to constant pain. HOME CARE INSTRUCTIONS  Avoid bending, heavy lifting, prolonged sitting, and activities which make the problem worse.  Take brief periods of rest throughout the day to reduce your pain. Lying down or standing usually is better than sitting while you are resting.  Take over-the-counter or prescription medicines only as directed by your caregiver. SEEK IMMEDIATE MEDICAL CARE IF:   You have weakness or numbness in one of your legs or feet.  You have trouble controlling your bladder or bowels.  You have nausea, vomiting, abdominal pain, shortness of breath, or  fainting. Document Released: 10/28/2004 Document Revised: 12/13/2011 Document Reviewed: 09/04/2011 [facility] Patient Information 2015 [place], [place]. This information is not intended to replace advice given to you by your health care provider. Make sure you discuss any questions you have with your health care provider.

## 2014-07-16 NOTE — ED Notes (Signed)
Pt c/o chronic lower back and sciatic nerve pain.  Pain score 9/10.  Pt reports that she is followed by a pain clinic and recently ran out of pain medication.  Pt stated to EMS that the medication they are providing is not helping and she needs something stronger.  Pt was able to ambulate to EMS stretcher.

## 2014-07-16 NOTE — ED Provider Notes (Signed)
Medical screening examination/treatment/procedure(s) were performed by non-physician practitioner and as supervising physician I was immediately available for consultation/collaboration.   EKG Interpretation None        Pamella Pert, MD 07/16/14 [phone]

## 2014-07-16 NOTE — ED Provider Notes (Signed)
CSN: [id number]     Arrival date & time 07/16/14  1302 History  This chart was scribed for non-physician practitioner, Zacarias Pontes, PA-C working with Pamella Pert, MD by Tula Nakayama, ED scribe. This patient was seen in room WTR5/WTR5 and the patient's care was started at 1:18 PM    Chief Complaint  Patient presents with  . Medication Refill   Patient is a 58 y.o. female presenting with back pain. The history is provided by the patient. No language interpreter was used.  Back Pain Location:  Lumbar spine Quality:  Aching Radiates to:  L posterior upper leg and R posterior upper leg Pain severity:  Severe Pain is:  Same all the time Onset quality:  Gradual Duration: chronic and ongoing. Timing:  Constant Progression:  Unchanged Chronicity:  Chronic Context: not recent injury and not twisting   Relieved by:  Nothing Worsened by:  Nothing tried Ineffective treatments:  Ibuprofen and narcotics Associated symptoms: leg pain and paresthesias (chronic and unchanged)   Associated symptoms: no abdominal pain, no bladder incontinence, no bowel incontinence, no chest pain, no dysuria, no fever, no numbness, no perianal numbness, no tingling, no weakness and no weight loss   Risk factors: no hx of cancer    HPI Comments: Phyllis [name] is a 58 y.o. female with a PMHx of HTN, OP, hep C, GERD, asthma, eczema, BLE periph neuropathy, hypothyroidism, vitamin D deficiency, and chronic lower back pain with a PSHx of lumbar laminectomy, currently managed at a pain clinic at [facility], who presents to the Emergency Department complaining of constant, unchanged, 9/10, aching, chronic lower back pain that radiates through both of her posterior legs. She states this is chronic but over the last 3 days it has become more frustrating. Pt states that nothing makes symptoms worse or better. Pt has tried TENS Unit with no relief to symptoms. Pt has pain management contract for Vicodin, but says that  current contract does not provide relief for symptoms. She denies recent heavy lifting or injury. Pt also denies weight loss, fevers, chills, CP, SOB, cough, abdominal pain, nausea, vomitting, bowel or urinary changes, incontinence, new tingling or numbness in legs, and changes in walking as associated symptoms. She does not have a history of cancer and leg swelling. She states she has asthma and with the recent change in weather she had some wheezing, but currently isn't having any issues with SOB or wheezing. Her husband states she has been wheezing at night. She has not tried any treatments for her symptoms. Pt has a history of asthma and states that  she smokes regularly, and does not have any inhalers prescribed to her. She has a cough with clear discharge as an associated symptom.    Past Medical History  Diagnosis Date  . Eczema   . ASTHMA 06/06/2009  . BACK PAIN 08/06/2010  . DEPRESSION 06/06/2009  . Edema 05/20/2010  . GERD 06/06/2009  . HEPATITIS C WITHOUT HEPATIC COMA 06/06/2009  . HYPERTENSION 06/06/2009  . OSTEOPOROSIS 06/06/2009  . 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 TOBACCO USE 06/05/2010  . Unspecified hypothyroidism 06/05/2010  . VITAMIN D DEFICIENCY 06/06/2009  . Chronic pancreatitis    Past Surgical History  Procedure Laterality Date  . Cholecystectomy    . Breast lumpectomy      benign, right  . Tubal ligation    . Tonsillectomy    . Lumbar laminectomy     Family History  Problem Relation Age of Onset  .  COPD Father   . Hypertension Mother   . Colon cancer Neg Hx   . Stomach cancer Neg Hx    History  Substance Use Topics  . Smoking status: Current Every Day Smoker -- 0.10 packs/day for 35 years    Types: Cigarettes  . Smokeless tobacco: Never Used     Comment: Currently only smokes with stress.  . Alcohol Use: No   OB History   Grav Para Term Preterm Abortions TAB SAB Ect Mult Living                 Review of Systems  Constitutional:  Negative for fever, chills, weight loss and unexpected weight change.  HENT: Negative for congestion and sore throat.   Respiratory: Positive for cough (at night, intermittent, none currently), shortness of breath (at night a few days ago, none ongoing) and wheezing (with changes in weather). Negative for chest tightness.   Cardiovascular: Negative for chest pain.  Gastrointestinal: Negative for nausea, vomiting, abdominal pain, diarrhea and bowel incontinence.  Genitourinary: Negative for bladder incontinence, dysuria and hematuria.  Musculoskeletal: Positive for back pain. Negative for myalgias, neck pain and neck stiffness.  Skin: Negative for color change.  Neurological: Positive for paresthesias (chronic and unchanged). Negative for tingling, weakness and numbness.    10 Systems reviewed and all are negative for acute change except as noted in the HPI.    Allergies  Meperidine hcl and Naproxen  Home Medications   Prior to Admission medications   Medication Sig Start Date End Date Taking? Authorizing Provider  ALPRAZolam Duanne Moron) 0.5 MG tablet Take 1 tablet (0.5 mg total) by mouth 3 (three) times daily as needed for anxiety or sleep. 05/09/14   [name], MD  aspirin EC 81 MG tablet Take 81 mg by mouth daily.    Historical Provider, MD  clotrimazole (MYCELEX) 10 MG troche Take 1 tablet (10 mg total) by mouth 5 (five) times daily. 05/09/14   [name], MD  HYDROcodone-acetaminophen ([place]) 10-325 MG per tablet Take 1 tablet by mouth every 6 (six) hours as needed (for pain). 02/23/14   Oswald Hillock, MD  ibuprofen (ADVIL,MOTRIN) 200 MG tablet Take 200 mg by mouth every 6 (six) hours as needed (for pain).    Historical Provider, MD  levothyroxine (SYNTHROID, LEVOTHROID) 25 MCG tablet TAKE 1 TABLET BY MOUTH EVERY DAY 02/06/14   [name], MD  losartan (COZAAR) 100 MG tablet Take 1 tablet (100 mg total) by mouth daily. 07/27/13   [name], MD  metoprolol tartrate (LOPRESSOR)  25 MG tablet Take 1 tablet (25 mg total) by mouth 2 (two) times daily. 01/07/14   [name], MD  potassium chloride SA (KLOR-CON M20) 20 MEQ tablet Take 1 tablet (20 mEq total) by mouth 2 (two) times daily. 05/09/14   [name], MD  triamcinolone ointment (KENALOG) 0.1 % Apply 1 application topically 2 (two) times daily. 05/09/14   [name], MD  venlafaxine (EFFEXOR) 37.5 MG tablet Take 37.5 mg by mouth 2 (two) times daily.  12/24/13   Historical Provider, MD   BP 175/109  Pulse 103  Temp(Src) 98.9 F (37.2 C) (Oral)  Resp 22  SpO2 97%  LMP 10/05/1999 Physical Exam  Nursing note and vitals reviewed. Constitutional: She is oriented to person, place, and time. She appears well-developed and well-nourished.  Non-toxic appearance. No distress.  Baseline HTN noted. NAD, otherwise VSS, afebrile and nontoxic  HENT:  Head: Normocephalic and  atraumatic.  Mouth/Throat: Mucous membranes are normal.  Eyes: Conjunctivae and EOM are normal. Right eye exhibits no discharge. Left eye exhibits no discharge.  Neck: Normal range of motion. Neck supple. No spinous process tenderness and no muscular tenderness present. No rigidity. Normal range of motion present.  FROM intact without spinous process or paraspinous muscle TTP, no bony stepoffs or deformities, no muscle spasms. No rigidity or meningeal signs. No bruising or swelling.  Cardiovascular: Normal rate, regular rhythm, normal heart sounds and intact distal pulses.  Exam reveals no gallop and no friction rub.   No murmur heard. Pulmonary/Chest: Effort normal and breath sounds normal. No respiratory distress. She has no decreased breath sounds. She has no wheezes. She has no rhonchi. She has no rales.  CTAB in all lung fields, no w/r/r No increased WOB Speaking in full sentences Oxygenation >97% on RA without difficulty  Abdominal: Soft. Normal appearance. She exhibits no distension. There is no tenderness.  Musculoskeletal: Normal range of  motion.  All spinal levels nonTTP without bony deformity or crepitus, no step offs. Ambulatory without assistance, gait nonantalgic. Strength 5/5 in all extremities, sensation grossly intact in all extremities, distal pulses intact. No LE swelling  Neurological: She is alert and oriented to person, place, and time. She has normal strength. No sensory deficit. Coordination and gait normal.  Skin: Skin is warm and dry. No rash noted.  Psychiatric: She has a normal mood and affect. Her behavior is normal.    ED Course  Procedures (including critical care time)  DIAGNOSTIC STUDIES: Oxygen Saturation is 97% on RA, normal by my interpretation.    COORDINATION OF CARE: 1:40 PM Will order shot of pain medication. Advised her to follow-up with pain clinic regarding long-term management. Discussed treatment plan with pt at bedside and pt agreed to plan.   Labs Review Labs Reviewed - No data to display  Imaging Review No results found. [place] VISIT 06/25/14: Saliva drug test at the last visit is consistent with her prescribed opiate and is also consistent with use of venlafaxine  Lumbar spine MRI with and without contrast on May 02, 2013 reveals at L1-L2, mild facet disease, L2-L3 shows minimal bulging annulus with slight lateral recess encroachment, mild facet disease. L3-L4 with mild to moderate hypertrophic facet changes. L4-L5 with bulging annulus, moderate facet disease, mild bilateral lateral recess encroachment. L5-S1 shows interbody cage fusion.     EKG Interpretation None      MDM   Final diagnoses:  Chronic back pain  Lumbar radiculopathy, chronic    58y/o female here with chronic back pain, unrelieved by pain clinic meds (vicodin). States her pain doctor is not adequately treating her. NCDB Reviewed with 1 narcotic prescription in the last year. Bottle review with empty Vicodin prescription from 3 weeks ago. Will give small dose of meds here to help with pain,  but cannot give rx since it would void contract. No red flag s/s of low back pain. No s/s of central cord compression or cauda equina. Lower extremities are neurovascularly intact and patient is ambulating without difficulty. Patient was counseled on back pain precautions and told to do activity as tolerated but do not lift, push, or pull heavy objects more than 10 pounds for the next week. Patient counseled to use ice or heat on back for no longer than 15 minutes every hour. Cannot give NSAIDs due to her "allergies" and states these don't help. Pt has asymptomatic HTN today, didn't take her home BP  meds this morning because she "didn't feel like it". Improved VS after pain control with morphine. No further work up required for this. Pt states she's having allergic wheezing, currently CTAB and no need for nebs or CXR. Advised claritin OTC and f/up with PCP regarding need for inhalers.  Patient urged to follow-up with PCP if pain does not improve with treatment and rest or if pain becomes recurrent. Urged to return with worsening severe pain, loss of bowel or bladder control, trouble walking. The patient verbalizes understanding and agrees with the plan.   I personally performed the services described in this documentation, which was scribed in my presence. The recorded information has been reviewed and is accurate.   BP 163/91  Pulse 92  Temp(Src) 98.9 F (37.2 C) (Oral)  Resp 22  SpO2 96%  LMP 10/05/1999  Meds ordered this encounter  Medications  . morphine 4 MG/ML injection 4 mg    Sig:      Patty Sermons [place], PA-C 07/16/14 1437

## 2014-08-05 ENCOUNTER — Other Ambulatory Visit: Payer: Self-pay | Admitting: Internal Medicine

## 2014-08-22 ENCOUNTER — Other Ambulatory Visit: Payer: Self-pay | Admitting: Internal Medicine

## 2014-08-26 ENCOUNTER — Telehealth: Payer: Self-pay | Admitting: Internal Medicine

## 2014-08-26 NOTE — Telephone Encounter (Signed)
Patient called stating the pharmacy never received rx for alprazolam. Please re-send.

## 2014-08-26 NOTE — Telephone Encounter (Signed)
Phoned in rx.

## 2014-09-09 ENCOUNTER — Ambulatory Visit: Payer: Self-pay | Admitting: Internal Medicine

## 2014-09-12 ENCOUNTER — Encounter (HOSPITAL_COMMUNITY): Payer: Self-pay | Admitting: Cardiology

## 2014-09-16 ENCOUNTER — Encounter: Payer: Self-pay | Admitting: Internal Medicine

## 2014-09-30 ENCOUNTER — Other Ambulatory Visit (INDEPENDENT_AMBULATORY_CARE_PROVIDER_SITE_OTHER): Payer: Medicare Other

## 2014-09-30 ENCOUNTER — Ambulatory Visit (INDEPENDENT_AMBULATORY_CARE_PROVIDER_SITE_OTHER): Payer: Medicare Other | Admitting: Internal Medicine

## 2014-09-30 ENCOUNTER — Encounter: Payer: Self-pay | Admitting: Internal Medicine

## 2014-09-30 VITALS — BP 160/100 | HR 60 | Temp 97.9°F | Resp 12 | Ht 65.0 in | Wt 143.0 lb

## 2014-09-30 DIAGNOSIS — Z0000 Encounter for general adult medical examination without abnormal findings: Secondary | ICD-10-CM

## 2014-09-30 DIAGNOSIS — I10 Essential (primary) hypertension: Secondary | ICD-10-CM

## 2014-09-30 DIAGNOSIS — M5417 Radiculopathy, lumbosacral region: Secondary | ICD-10-CM

## 2014-09-30 DIAGNOSIS — B1710 Acute hepatitis C without hepatic coma: Secondary | ICD-10-CM

## 2014-09-30 DIAGNOSIS — IMO0001 Reserved for inherently not codable concepts without codable children: Secondary | ICD-10-CM

## 2014-09-30 DIAGNOSIS — E038 Other specified hypothyroidism: Secondary | ICD-10-CM

## 2014-09-30 DIAGNOSIS — J4489 Other specified chronic obstructive pulmonary disease: Secondary | ICD-10-CM | POA: Insufficient documentation

## 2014-09-30 DIAGNOSIS — Z23 Encounter for immunization: Secondary | ICD-10-CM

## 2014-09-30 DIAGNOSIS — J449 Chronic obstructive pulmonary disease, unspecified: Secondary | ICD-10-CM | POA: Insufficient documentation

## 2014-09-30 DIAGNOSIS — M5416 Radiculopathy, lumbar region: Secondary | ICD-10-CM

## 2014-09-30 DIAGNOSIS — Z1231 Encounter for screening mammogram for malignant neoplasm of breast: Secondary | ICD-10-CM

## 2014-09-30 DIAGNOSIS — F418 Other specified anxiety disorders: Secondary | ICD-10-CM

## 2014-09-30 LAB — LIPID PANEL
Cholesterol: 117 mg/dL (ref 0–200)
HDL: 35.8 mg/dL — AB (ref >39.00)
LDL Cholesterol: 64 mg/dL (ref 0–99)
NONHDL: 81.2
Total CHOL/HDL Ratio: 3
Triglycerides: 85 mg/dL (ref 0.0–149.0)
VLDL: 17 mg/dL (ref 0.0–40.0)

## 2014-09-30 LAB — CBC WITH DIFFERENTIAL/PLATELET
Basophils Absolute: 0 10*3/uL (ref 0.0–0.1)
Basophils Relative: 0.4 % (ref 0.0–3.0)
EOS PCT: 0.7 % (ref 0.0–5.0)
Eosinophils Absolute: 0 10*3/uL (ref 0.0–0.7)
HCT: 49.4 % — ABNORMAL HIGH (ref 36.0–46.0)
Hemoglobin: 15.9 g/dL — ABNORMAL HIGH (ref 12.0–15.0)
LYMPHS PCT: 39.9 % (ref 12.0–46.0)
Lymphs Abs: 2.4 10*3/uL (ref 0.7–4.0)
MCHC: 32.3 g/dL (ref 30.0–36.0)
MCV: 97.8 fl (ref 78.0–100.0)
MONO ABS: 0.4 10*3/uL (ref 0.1–1.0)
Monocytes Relative: 6.9 % (ref 3.0–12.0)
Neutro Abs: 3.2 10*3/uL (ref 1.4–7.7)
Neutrophils Relative %: 52.1 % (ref 43.0–77.0)
PLATELETS: 126 10*3/uL — AB (ref 150.0–400.0)
RBC: 5.05 Mil/uL (ref 3.87–5.11)
RDW: 13.5 % (ref 11.5–15.5)
WBC: 6.1 10*3/uL (ref 4.0–10.5)

## 2014-09-30 LAB — URINALYSIS, ROUTINE W REFLEX MICROSCOPIC
Bilirubin Urine: NEGATIVE
KETONES UR: NEGATIVE
Nitrite: NEGATIVE
PH: 7 (ref 5.0–8.0)
TOTAL PROTEIN, URINE-UPE24: NEGATIVE
Urine Glucose: NEGATIVE
Urobilinogen, UA: 1 (ref 0.0–1.0)

## 2014-09-30 LAB — COMPREHENSIVE METABOLIC PANEL
ALT: 16 U/L (ref 0–35)
AST: 26 U/L (ref 0–37)
Albumin: 4 g/dL (ref 3.5–5.2)
Alkaline Phosphatase: 96 U/L (ref 39–117)
BILIRUBIN TOTAL: 0.5 mg/dL (ref 0.2–1.2)
BUN: 4 mg/dL — ABNORMAL LOW (ref 6–23)
CALCIUM: 9.3 mg/dL (ref 8.4–10.5)
CHLORIDE: 96 meq/L (ref 96–112)
CO2: 33 meq/L — AB (ref 19–32)
Creatinine, Ser: 0.7 mg/dL (ref 0.4–1.2)
GFR: 117.41 mL/min (ref >60.00)
GLUCOSE: 77 mg/dL (ref 70–99)
Potassium: 4.2 mEq/L (ref 3.5–5.1)
Sodium: 133 mEq/L — ABNORMAL LOW (ref 135–145)
Total Protein: 7.8 g/dL (ref 6.0–8.3)

## 2014-09-30 LAB — TSH: TSH: 1.58 u[IU]/mL (ref 0.35–4.50)

## 2014-09-30 MED ORDER — HYDROCHLOROTHIAZIDE 12.5 MG PO CAPS: 12.5000 mg | ORAL_CAPSULE | Freq: Every day | ORAL | Status: DC

## 2014-09-30 MED ORDER — ALPRAZOLAM 0.5 MG PO TABS: 0.5000 mg | ORAL_TABLET | Freq: Three times a day (TID) | ORAL | Status: DC | PRN

## 2014-09-30 MED ORDER — ALBUTEROL SULFATE 108 (90 BASE) MCG/ACT IN AEPB: 1.0000 | INHALATION_SPRAY | Freq: Four times a day (QID) | RESPIRATORY_TRACT | Status: DC | PRN

## 2014-09-30 NOTE — Patient Instructions (Signed)
Preventive Care for Adults A healthy lifestyle and preventive care can promote health and wellness. Preventive health guidelines for women include the following key practices.  A routine yearly physical is a good way to check with your health care provider about your health and preventive screening. It is a chance to share any concerns and updates on your health and to receive a thorough exam.  Visit your dentist for a routine exam and preventive care every 6 months. Brush your teeth twice a day and floss once a day. Good oral hygiene prevents tooth decay and gum disease.  The frequency of eye exams is based on your age, health, family medical history, use of contact lenses, and other factors. Follow your health care provider's recommendations for frequency of eye exams.  Eat a healthy diet. Foods like vegetables, fruits, whole grains, low-fat dairy products, and lean protein foods contain the nutrients you need without too many calories. Decrease your intake of foods high in solid fats, added sugars, and salt. Eat the right amount of calories for you.Get information about a proper diet from your health care provider, if necessary.  Regular physical exercise is one of the most important things you can do for your health. Most adults should get at least 150 minutes of moderate-intensity exercise (any activity that increases your heart rate and causes you to sweat) each week. In addition, most adults need muscle-strengthening exercises on 2 or more days a week.  Maintain a healthy weight. The body mass index (BMI) is a screening tool to identify possible weight problems. It provides an estimate of body fat based on height and weight. Your health care provider can find your BMI and can help you achieve or maintain a healthy weight.For adults 20 years and older:  A BMI below 18.5 is considered underweight.  A BMI of 18.5 to 24.9 is normal.  A BMI of 25 to 29.9 is considered overweight.  A BMI of  30 and above is considered obese.  Maintain normal blood lipids and cholesterol levels by exercising and minimizing your intake of saturated fat. Eat a balanced diet with plenty of fruit and vegetables. Blood tests for lipids and cholesterol should begin at age 76 and be repeated every 5 years. If your lipid or cholesterol levels are high, you are over 50, or you are at high risk for heart disease, you may need your cholesterol levels checked more frequently.Ongoing high lipid and cholesterol levels should be treated with medicines if diet and exercise are not working.  If you smoke, find out from your health care provider how to quit. If you do not use tobacco, do not start.  Lung cancer screening is recommended for adults aged 22-80 years who are at high risk for developing lung cancer because of a history of smoking. A yearly low-dose CT scan of the lungs is recommended for people who have at least a 30-pack-year history of smoking and are a current smoker or have quit within the past 15 years. A pack year of smoking is smoking an average of 1 pack of cigarettes a day for 1 year (for example: 1 pack a day for 30 years or 2 packs a day for 15 years). Yearly screening should continue until the smoker has stopped smoking for at least 15 years. Yearly screening should be stopped for people who develop a health problem that would prevent them from having lung cancer treatment.  If you are pregnant, do not drink alcohol. If you are breastfeeding,  be very cautious about drinking alcohol. If you are not pregnant and choose to drink alcohol, do not have more than 1 drink per day. One drink is considered to be 12 ounces (355 mL) of beer, 5 ounces (148 mL) of wine, or 1.5 ounces (44 mL) of liquor.  Avoid use of street drugs. Do not share needles with anyone. Ask for help if you need support or instructions about stopping the use of drugs.  High blood pressure causes heart disease and increases the risk of  stroke. Your blood pressure should be checked at least every 1 to 2 years. Ongoing high blood pressure should be treated with medicines if weight loss and exercise do not work.  If you are 75-52 years old, ask your health care provider if you should take aspirin to prevent strokes.  Diabetes screening involves taking a blood sample to check your fasting blood sugar level. This should be done once every 3 years, after age 15, if you are within normal weight and without risk factors for diabetes. Testing should be considered at a younger age or be carried out more frequently if you are overweight and have at least 1 risk factor for diabetes.  Breast cancer screening is essential preventive care for women. You should practice "breast self-awareness." This means understanding the normal appearance and feel of your breasts and may include breast self-examination. Any changes detected, no matter how small, should be reported to a health care provider. Women in their 58s and 30s should have a clinical breast exam (CBE) by a health care provider as part of a regular health exam every 1 to 3 years. After age 16, women should have a CBE every year. Starting at age 53, women should consider having a mammogram (breast X-ray test) every year. Women who have a family history of breast cancer should talk to their health care provider about genetic screening. Women at a high risk of breast cancer should talk to their health care providers about having an MRI and a mammogram every year.  Breast cancer gene (BRCA)-related cancer risk assessment is recommended for women who have family members with BRCA-related cancers. BRCA-related cancers include breast, ovarian, tubal, and peritoneal cancers. Having family members with these cancers may be associated with an increased risk for harmful changes (mutations) in the breast cancer genes BRCA1 and BRCA2. Results of the assessment will determine the need for genetic counseling and  BRCA1 and BRCA2 testing.  Routine pelvic exams to screen for cancer are no longer recommended for nonpregnant women who are considered low risk for cancer of the pelvic organs (ovaries, uterus, and vagina) and who do not have symptoms. Ask your health care provider if a screening pelvic exam is right for you.  If you have had past treatment for cervical cancer or a condition that could lead to cancer, you need Pap tests and screening for cancer for at least 20 years after your treatment. If Pap tests have been discontinued, your risk factors (such as having a new sexual partner) need to be reassessed to determine if screening should be resumed. Some women have medical problems that increase the chance of getting cervical cancer. In these cases, your health care provider may recommend more frequent screening and Pap tests.  The HPV test is an additional test that may be used for cervical cancer screening. The HPV test looks for the virus that can cause the cell changes on the cervix. The cells collected during the Pap test can be  tested for HPV. The HPV test could be used to screen women aged 30 years and older, and should be used in women of any age who have unclear Pap test results. After the age of 30, women should have HPV testing at the same frequency as a Pap test.  Colorectal cancer can be detected and often prevented. Most routine colorectal cancer screening begins at the age of 50 years and continues through age 75 years. However, your health care provider may recommend screening at an earlier age if you have risk factors for colon cancer. On a yearly basis, your health care provider may provide home test kits to check for hidden blood in the stool. Use of a small camera at the end of a tube, to directly examine the colon (sigmoidoscopy or colonoscopy), can detect the earliest forms of colorectal cancer. Talk to your health care provider about this at age 50, when routine screening begins. Direct  exam of the colon should be repeated every 5-10 years through age 75 years, unless early forms of pre-cancerous polyps or small growths are found.  People who are at an increased risk for hepatitis B should be screened for this virus. You are considered at high risk for hepatitis B if:  You were born in a country where hepatitis B occurs often. Talk with your health care provider about which countries are considered high risk.  Your parents were born in a high-risk country and you have not received a shot to protect against hepatitis B (hepatitis B vaccine).  You have HIV or AIDS.  You use needles to inject street drugs.  You live with, or have sex with, someone who has hepatitis B.  You get hemodialysis treatment.  You take certain medicines for conditions like cancer, organ transplantation, and autoimmune conditions.  Hepatitis C blood testing is recommended for all people born from 1945 through 1965 and any individual with known risks for hepatitis C.  Practice safe sex. Use condoms and avoid high-risk sexual practices to reduce the spread of sexually transmitted infections (STIs). STIs include gonorrhea, chlamydia, syphilis, trichomonas, herpes, HPV, and human immunodeficiency virus (HIV). Herpes, HIV, and HPV are viral illnesses that have no cure. They can result in disability, cancer, and death.  You should be screened for sexually transmitted illnesses (STIs) including gonorrhea and chlamydia if:  You are sexually active and are younger than 24 years.  You are older than 24 years and your health care provider tells you that you are at risk for this type of infection.  Your sexual activity has changed since you were last screened and you are at an increased risk for chlamydia or gonorrhea. Ask your health care provider if you are at risk.  If you are at risk of being infected with HIV, it is recommended that you take a prescription medicine daily to prevent HIV infection. This is  called preexposure prophylaxis (PrEP). You are considered at risk if:  You are a heterosexual woman, are sexually active, and are at increased risk for HIV infection.  You take drugs by injection.  You are sexually active with a partner who has HIV.  Talk with your health care provider about whether you are at high risk of being infected with HIV. If you choose to begin PrEP, you should first be tested for HIV. You should then be tested every 3 months for as long as you are taking PrEP.  Osteoporosis is a disease in which the bones lose minerals and strength   with aging. This can result in serious bone fractures or breaks. The risk of osteoporosis can be identified using a bone density scan. Women ages 65 years and over and women at risk for fractures or osteoporosis should discuss screening with their health care providers. Ask your health care provider whether you should take a calcium supplement or vitamin D to reduce the rate of osteoporosis.  Menopause can be associated with physical symptoms and risks. Hormone replacement therapy is available to decrease symptoms and risks. You should talk to your health care provider about whether hormone replacement therapy is right for you.  Use sunscreen. Apply sunscreen liberally and repeatedly throughout the day. You should seek shade when your shadow is shorter than you. Protect yourself by wearing long sleeves, pants, a wide-brimmed hat, and sunglasses year round, whenever you are outdoors.  Once a month, do a whole body skin exam, using a mirror to look at the skin on your back. Tell your health care provider of new moles, moles that have irregular borders, moles that are larger than a pencil eraser, or moles that have changed in shape or color.  Stay current with required vaccines (immunizations).  Influenza vaccine. All adults should be immunized every year.  Tetanus, diphtheria, and acellular pertussis (Td, Tdap) vaccine. Pregnant women should  receive 1 dose of Tdap vaccine during each pregnancy. The dose should be obtained regardless of the length of time since the last dose. Immunization is preferred during the 27th-36th week of gestation. An adult who has not previously received Tdap or who does not know her vaccine status should receive 1 dose of Tdap. This initial dose should be followed by tetanus and diphtheria toxoids (Td) booster doses every 10 years. Adults with an unknown or incomplete history of completing a 3-dose immunization series with Td-containing vaccines should begin or complete a primary immunization series including a Tdap dose. Adults should receive a Td booster every 10 years.  Varicella vaccine. An adult without evidence of immunity to varicella should receive 2 doses or a second dose if she has previously received 1 dose. Pregnant females who do not have evidence of immunity should receive the first dose after pregnancy. This first dose should be obtained before leaving the health care facility. The second dose should be obtained 4-8 weeks after the first dose.  Human papillomavirus (HPV) vaccine. Females aged 13-26 years who have not received the vaccine previously should obtain the 3-dose series. The vaccine is not recommended for use in pregnant females. However, pregnancy testing is not needed before receiving a dose. If a female is found to be pregnant after receiving a dose, no treatment is needed. In that case, the remaining doses should be delayed until after the pregnancy. Immunization is recommended for any person with an immunocompromised condition through the age of 26 years if she did not get any or all doses earlier. During the 3-dose series, the second dose should be obtained 4-8 weeks after the first dose. The third dose should be obtained 24 weeks after the first dose and 16 weeks after the second dose.  Zoster vaccine. One dose is recommended for adults aged 60 years or older unless certain conditions are  present.  Measles, mumps, and rubella (MMR) vaccine. Adults born before 1957 generally are considered immune to measles and mumps. Adults born in 1957 or later should have 1 or more doses of MMR vaccine unless there is a contraindication to the vaccine or there is laboratory evidence of immunity to   each of the three diseases. A routine second dose of MMR vaccine should be obtained at least 28 days after the first dose for students attending postsecondary schools, health care workers, or international travelers. People who received inactivated measles vaccine or an unknown type of measles vaccine during 1963-1967 should receive 2 doses of MMR vaccine. People who received inactivated mumps vaccine or an unknown type of mumps vaccine before 1979 and are at high risk for mumps infection should consider immunization with 2 doses of MMR vaccine. For females of childbearing age, rubella immunity should be determined. If there is no evidence of immunity, females who are not pregnant should be vaccinated. If there is no evidence of immunity, females who are pregnant should delay immunization until after pregnancy. Unvaccinated health care workers born before 1957 who lack laboratory evidence of measles, mumps, or rubella immunity or laboratory confirmation of disease should consider measles and mumps immunization with 2 doses of MMR vaccine or rubella immunization with 1 dose of MMR vaccine.  Pneumococcal 13-valent conjugate (PCV13) vaccine. When indicated, a person who is uncertain of her immunization history and has no record of immunization should receive the PCV13 vaccine. An adult aged 19 years or older who has certain medical conditions and has not been previously immunized should receive 1 dose of PCV13 vaccine. This PCV13 should be followed with a dose of pneumococcal polysaccharide (PPSV23) vaccine. The PPSV23 vaccine dose should be obtained at least 8 weeks after the dose of PCV13 vaccine. An adult aged 19  years or older who has certain medical conditions and previously received 1 or more doses of PPSV23 vaccine should receive 1 dose of PCV13. The PCV13 vaccine dose should be obtained 1 or more years after the last PPSV23 vaccine dose.  Pneumococcal polysaccharide (PPSV23) vaccine. When PCV13 is also indicated, PCV13 should be obtained first. All adults aged 65 years and older should be immunized. An adult younger than age 65 years who has certain medical conditions should be immunized. Any person who resides in a nursing home or long-term care facility should be immunized. An adult smoker should be immunized. People with an immunocompromised condition and certain other conditions should receive both PCV13 and PPSV23 vaccines. People with human immunodeficiency virus (HIV) infection should be immunized as soon as possible after diagnosis. Immunization during chemotherapy or radiation therapy should be avoided. Routine use of PPSV23 vaccine is not recommended for American Indians, [place] Natives, or people younger than 65 years unless there are medical conditions that require PPSV23 vaccine. When indicated, people who have unknown immunization and have no record of immunization should receive PPSV23 vaccine. One-time revaccination 5 years after the first dose of PPSV23 is recommended for people aged 19-64 years who have chronic kidney failure, nephrotic syndrome, asplenia, or immunocompromised conditions. People who received 1-2 doses of PPSV23 before age 65 years should receive another dose of PPSV23 vaccine at age 65 years or later if at least 5 years have passed since the previous dose. Doses of PPSV23 are not needed for people immunized with PPSV23 at or after age 65 years.  Meningococcal vaccine. Adults with asplenia or persistent complement component deficiencies should receive 2 doses of quadrivalent meningococcal conjugate (MenACWY-D) vaccine. The doses should be obtained at least 2 months apart.  Microbiologists working with certain meningococcal bacteria, military recruits, people at risk during an outbreak, and people who travel to or live in countries with a high rate of meningitis should be immunized. A first-year college student up through age   21 years who is living in a residence hall should receive a dose if she did not receive a dose on or after her 16th birthday. Adults who have certain high-risk conditions should receive one or more doses of vaccine.  Hepatitis A vaccine. Adults who wish to be protected from this disease, have certain high-risk conditions, work with hepatitis A-infected animals, work in hepatitis A research labs, or travel to or work in countries with a high rate of hepatitis A should be immunized. Adults who were previously unvaccinated and who anticipate close contact with an international adoptee during the first 60 days after arrival in the [place] from a country with a high rate of hepatitis A should be immunized.  Hepatitis B vaccine. Adults who wish to be protected from this disease, have certain high-risk conditions, may be exposed to blood or other infectious body fluids, are household contacts or sex partners of hepatitis B positive people, are clients or workers in certain care facilities, or travel to or work in countries with a high rate of hepatitis B should be immunized.  Haemophilus influenzae type b (Hib) vaccine. A previously unvaccinated person with asplenia or sickle cell disease or having a scheduled splenectomy should receive 1 dose of Hib vaccine. Regardless of previous immunization, a recipient of a hematopoietic stem cell transplant should receive a 3-dose series 6-12 months after her successful transplant. Hib vaccine is not recommended for adults with HIV infection. Preventive Services / Frequency Ages 64 to 68 years  Blood pressure check.** / Every 1 to 2 years.  Lipid and cholesterol check.** / Every 5 years beginning at age  22.  Clinical breast exam.** / Every 3 years for women in their 88s and 53s.  BRCA-related cancer risk assessment.** / For women who have family members with a BRCA-related cancer (breast, ovarian, tubal, or peritoneal cancers).  Pap test.** / Every 2 years from ages 90 through 51. Every 3 years starting at age 21 through age 56 or 3 with a history of 3 consecutive normal Pap tests.  HPV screening.** / Every 3 years from ages 24 through ages 1 to 46 with a history of 3 consecutive normal Pap tests.  Hepatitis C blood test.** / For any individual with known risks for hepatitis C.  Skin self-exam. / Monthly.  Influenza vaccine. / Every year.  Tetanus, diphtheria, and acellular pertussis (Tdap, Td) vaccine.** / Consult your health care provider. Pregnant women should receive 1 dose of Tdap vaccine during each pregnancy. 1 dose of Td every 10 years.  Varicella vaccine.** / Consult your health care provider. Pregnant females who do not have evidence of immunity should receive the first dose after pregnancy.  HPV vaccine. / 3 doses over 6 months, if 72 and younger. The vaccine is not recommended for use in pregnant females. However, pregnancy testing is not needed before receiving a dose.  Measles, mumps, rubella (MMR) vaccine.** / You need at least 1 dose of MMR if you were born in 1957 or later. You may also need a 2nd dose. For females of childbearing age, rubella immunity should be determined. If there is no evidence of immunity, females who are not pregnant should be vaccinated. If there is no evidence of immunity, females who are pregnant should delay immunization until after pregnancy.  Pneumococcal 13-valent conjugate (PCV13) vaccine.** / Consult your health care provider.  Pneumococcal polysaccharide (PPSV23) vaccine.** / 1 to 2 doses if you smoke cigarettes or if you have certain conditions.  Meningococcal vaccine.** /  1 dose if you are age 19 to 21 years and a first-year college  student living in a residence hall, or have one of several medical conditions, you need to get vaccinated against meningococcal disease. You may also need additional booster doses.  Hepatitis A vaccine.** / Consult your health care provider.  Hepatitis B vaccine.** / Consult your health care provider.  Haemophilus influenzae type b (Hib) vaccine.** / Consult your health care provider. Ages 40 to 64 years  Blood pressure check.** / Every 1 to 2 years.  Lipid and cholesterol check.** / Every 5 years beginning at age 20 years.  Lung cancer screening. / Every year if you are aged 55-80 years and have a 30-pack-year history of smoking and currently smoke or have quit within the past 15 years. Yearly screening is stopped once you have quit smoking for at least 15 years or develop a health problem that would prevent you from having lung cancer treatment.  Clinical breast exam.** / Every year after age 40 years.  BRCA-related cancer risk assessment.** / For women who have family members with a BRCA-related cancer (breast, ovarian, tubal, or peritoneal cancers).  Mammogram.** / Every year beginning at age 40 years and continuing for as long as you are in good health. Consult with your health care provider.  Pap test.** / Every 3 years starting at age 30 years through age 65 or 70 years with a history of 3 consecutive normal Pap tests.  HPV screening.** / Every 3 years from ages 30 years through ages 65 to 70 years with a history of 3 consecutive normal Pap tests.  Fecal occult blood test (FOBT) of stool. / Every year beginning at age 50 years and continuing until age 75 years. You may not need to do this test if you get a colonoscopy every 10 years.  Flexible sigmoidoscopy or colonoscopy.** / Every 5 years for a flexible sigmoidoscopy or every 10 years for a colonoscopy beginning at age 50 years and continuing until age 75 years.  Hepatitis C blood test.** / For all people born from 1945 through  1965 and any individual with known risks for hepatitis C.  Skin self-exam. / Monthly.  Influenza vaccine. / Every year.  Tetanus, diphtheria, and acellular pertussis (Tdap/Td) vaccine.** / Consult your health care provider. Pregnant women should receive 1 dose of Tdap vaccine during each pregnancy. 1 dose of Td every 10 years.  Varicella vaccine.** / Consult your health care provider. Pregnant females who do not have evidence of immunity should receive the first dose after pregnancy.  Zoster vaccine.** / 1 dose for adults aged 60 years or older.  Measles, mumps, rubella (MMR) vaccine.** / You need at least 1 dose of MMR if you were born in 1957 or later. You may also need a 2nd dose. For females of childbearing age, rubella immunity should be determined. If there is no evidence of immunity, females who are not pregnant should be vaccinated. If there is no evidence of immunity, females who are pregnant should delay immunization until after pregnancy.  Pneumococcal 13-valent conjugate (PCV13) vaccine.** / Consult your health care provider.  Pneumococcal polysaccharide (PPSV23) vaccine.** / 1 to 2 doses if you smoke cigarettes or if you have certain conditions.  Meningococcal vaccine.** / Consult your health care provider.  Hepatitis A vaccine.** / Consult your health care provider.  Hepatitis B vaccine.** / Consult your health care provider.  Haemophilus influenzae type b (Hib) vaccine.** / Consult your health care provider. Ages 65   years and over  Blood pressure check.** / Every 1 to 2 years.  Lipid and cholesterol check.** / Every 5 years beginning at age 13 years.  Lung cancer screening. / Every year if you are aged 37-80 years and have a 30-pack-year history of smoking and currently smoke or have quit within the past 15 years. Yearly screening is stopped once you have quit smoking for at least 15 years or develop a health problem that would prevent you from having lung cancer  treatment.  Clinical breast exam.** / Every year after age 76 years.  BRCA-related cancer risk assessment.** / For women who have family members with a BRCA-related cancer (breast, ovarian, tubal, or peritoneal cancers).  Mammogram.** / Every year beginning at age 71 years and continuing for as long as you are in good health. Consult with your health care provider.  Pap test.** / Every 3 years starting at age 32 years through age 65 or 39 years with 3 consecutive normal Pap tests. Testing can be stopped between 65 and 70 years with 3 consecutive normal Pap tests and no abnormal Pap or HPV tests in the past 10 years.  HPV screening.** / Every 3 years from ages 36 years through ages 22 or 80 years with a history of 3 consecutive normal Pap tests. Testing can be stopped between 65 and 70 years with 3 consecutive normal Pap tests and no abnormal Pap or HPV tests in the past 10 years.  Fecal occult blood test (FOBT) of stool. / Every year beginning at age 34 years and continuing until age 65 years. You may not need to do this test if you get a colonoscopy every 10 years.  Flexible sigmoidoscopy or colonoscopy.** / Every 5 years for a flexible sigmoidoscopy or every 10 years for a colonoscopy beginning at age 75 years and continuing until age 60 years.  Hepatitis C blood test.** / For all people born from [birth year] through 1967 and any individual with known risks for hepatitis C.  Osteoporosis screening.** / A one-time screening for women ages 18 years and over and women at risk for fractures or osteoporosis.  Skin self-exam. / Monthly.  Influenza vaccine. / Every year.  Tetanus, diphtheria, and acellular pertussis (Tdap/Td) vaccine.** / 1 dose of Td every 10 years.  Varicella vaccine.** / Consult your health care provider.  Zoster vaccine.** / 1 dose for adults aged 26 years or older.  Pneumococcal 13-valent conjugate (PCV13) vaccine.** / Consult your health care provider.  Pneumococcal  polysaccharide (PPSV23) vaccine.** / 1 dose for all adults aged 89 years and older.  Meningococcal vaccine.** / Consult your health care provider.  Hepatitis A vaccine.** / Consult your health care provider.  Hepatitis B vaccine.** / Consult your health care provider.  Haemophilus influenzae type b (Hib) vaccine.** / Consult your health care provider. ** Family history and personal history of risk and conditions may change your health care provider's recommendations. Document Released: 11/16/2001 Document Revised: 02/04/2014 Document Reviewed: 02/15/2011 [facility] Patient Information 2015 [place], [place]. This information is not intended to replace advice given to you by your health care provider. Make sure you discuss any questions you have with your health care provider. Hypothyroidism The thyroid is a large gland located in the lower front of your neck. The thyroid gland helps control metabolism. Metabolism is how your body handles food. It controls metabolism with the hormone thyroxine. When this gland is underactive (hypothyroid), it produces too little hormone.  CAUSES These include:   Absence or  destruction of thyroid tissue.  Goiter due to iodine deficiency.  Goiter due to medications.  Congenital defects (since birth).  Problems with the pituitary. This causes a lack of TSH (thyroid stimulating hormone). This hormone tells the thyroid to turn out more hormone. SYMPTOMS  Lethargy (feeling as though you have no energy)  Cold intolerance  Weight gain (in spite of normal food intake)  Dry skin  Coarse hair  Menstrual irregularity (if severe, may lead to infertility)  Slowing of thought processes Cardiac problems are also caused by insufficient amounts of thyroid hormone. Hypothyroidism in the newborn is cretinism, and is an extreme form. It is important that this form be treated adequately and immediately or it will lead rapidly to retarded physical and mental  development. DIAGNOSIS  To prove hypothyroidism, your caregiver may do blood tests and ultrasound tests. Sometimes the signs are hidden. It may be necessary for your caregiver to watch this illness with blood tests either before or after diagnosis and treatment. TREATMENT  Low levels of thyroid hormone are increased by using synthetic thyroid hormone. This is a safe, effective treatment. It usually takes about four weeks to gain the full effects of the medication. After you have the full effect of the medication, it will generally take another four weeks for problems to leave. Your caregiver may start you on low doses. If you have had heart problems the dose may be gradually increased. It is generally not an emergency to get rapidly to normal. HOME CARE INSTRUCTIONS   Take your medications as your caregiver suggests. Let your caregiver know of any medications you are taking or start taking. Your caregiver will help you with dosage schedules.  As your condition improves, your dosage needs may increase. It will be necessary to have continuing blood tests as suggested by your caregiver.  Report all suspected medication side effects to your caregiver. SEEK MEDICAL CARE IF: Seek medical care if you develop:  Sweating.  Tremulousness (tremors).  Anxiety.  Rapid weight loss.  Heat intolerance.  Emotional swings.  Diarrhea.  Weakness. SEEK IMMEDIATE MEDICAL CARE IF:  You develop chest pain, an irregular heart beat (palpitations), or a rapid heart beat. MAKE SURE YOU:   Understand these instructions.  Will watch your condition.  Will get help right away if you are not doing well or get worse. Document Released: 09/20/2005 Document Revised: 12/13/2011 Document Reviewed: 05/10/2008 [facility] Patient Information 2015 [place], [place]. This information is not intended to replace advice given to you by your health care provider. Make sure you discuss any questions you have with your health  care provider.

## 2014-09-30 NOTE — Progress Notes (Signed)
Subjective:    Patient ID: Phyllis [name], female    DOB: 17-Dec-1955, 58 y.o.   MRN: [id number]  Hypertension This is a chronic problem. The current episode started more than 1 year ago. The problem has been gradually worsening since onset. The problem is uncontrolled. Associated symptoms include anxiety. Pertinent negatives include no blurred vision, chest pain, headaches, malaise/fatigue, neck pain, orthopnea, palpitations, peripheral edema, PND, shortness of breath or sweats. Agents associated with hypertension include NSAIDs. Risk factors for coronary artery disease include smoking/tobacco exposure. Past treatments include angiotensin blockers and beta blockers. Compliance problems include exercise and diet.       Review of Systems  Constitutional: Negative.  Negative for fever, chills, malaise/fatigue, diaphoresis, appetite change and fatigue.  HENT: Negative.   Eyes: Negative.  Negative for blurred vision.  Respiratory: Positive for wheezing. Negative for apnea, cough, choking, chest tightness, shortness of breath and stridor.   Cardiovascular: Negative.  Negative for chest pain, palpitations, orthopnea, leg swelling and PND.  Gastrointestinal: Negative.  Negative for nausea, vomiting, abdominal pain, diarrhea, constipation and blood in stool.  Endocrine: Negative.   Genitourinary: Negative.   Musculoskeletal: Negative.  Negative for arthralgias and neck pain.  Skin: Negative.  Negative for rash.  Allergic/Immunologic: Negative.   Neurological: Negative.  Negative for dizziness, tremors, syncope, light-headedness, numbness and headaches.  Hematological: Negative.  Negative for adenopathy. Does not bruise/bleed easily.  Psychiatric/Behavioral: Positive for sleep disturbance. Negative for suicidal ideas, hallucinations, behavioral problems, confusion, self-injury, dysphoric mood, decreased concentration and agitation. The patient is nervous/anxious. The patient is not hyperactive.          Objective:   Physical Exam  Constitutional: She is oriented to person, place, and time. She appears well-developed and well-nourished. No distress.  HENT:  Head: Normocephalic and atraumatic.  Mouth/Throat: Oropharynx is clear and moist. No oropharyngeal exudate.  Eyes: Conjunctivae are normal. Right eye exhibits no discharge. Left eye exhibits no discharge. No scleral icterus.  Neck: Normal range of motion. Neck supple. No JVD present. No tracheal deviation present. No thyromegaly present.  Cardiovascular: Normal rate, regular rhythm, normal heart sounds and intact distal pulses.  Exam reveals no gallop and no friction rub.   No murmur heard. Pulmonary/Chest: Effort normal. No accessory muscle usage or stridor. No respiratory distress. She has no decreased breath sounds. She has no wheezes. She has rhonchi in the right middle field and the left middle field. She has no rales. She exhibits no tenderness.  Abdominal: Soft. Bowel sounds are normal. She exhibits no distension and no mass. There is no tenderness. There is no rebound and no guarding. Hernia confirmed negative in the right inguinal area.  Genitourinary: Rectum normal and vagina normal. Rectal exam shows no external hemorrhoid, no internal hemorrhoid, no fissure, no mass, no tenderness and anal tone normal. Guaiac negative stool. No breast swelling, tenderness, discharge or bleeding. Pelvic exam was performed with patient supine. No labial fusion. There is no rash, tenderness, lesion or injury on the right labia. There is no rash, tenderness, lesion or injury on the left labia. Cervix exhibits no discharge. Right adnexum displays no mass, no tenderness and no fullness. Left adnexum displays no mass, no tenderness and no fullness. No erythema, tenderness or bleeding in the vagina. No foreign body around the vagina. No signs of injury around the vagina. No vaginal discharge found.  Musculoskeletal: Normal range of motion. She exhibits  no edema or tenderness.  Lymphadenopathy:    She has no cervical adenopathy.  Right: No inguinal adenopathy present.       Left: No inguinal adenopathy present.  Neurological: She is oriented to person, place, and time.  Skin: Skin is warm and dry. No rash noted. She is not diaphoretic. No erythema. No pallor.  Psychiatric: She has a normal mood and affect. Her behavior is normal. Judgment and thought content normal.  Vitals reviewed.     Lab Results  Component Value Date   WBC 8.2 02/22/2014   HGB 13.5 02/22/2014   HCT 38.0 02/22/2014   PLT 138* 02/22/2014   GLUCOSE 64* 05/09/2014   CHOL 104 01/07/2014   TRIG 42.0 01/07/2014   HDL 39.80 01/07/2014   LDLCALC 56 01/07/2014   ALT 18 02/22/2014   AST 37 02/22/2014   NA 130* 05/09/2014   K 3.0* 05/09/2014   CL 94* 05/09/2014   CREATININE 0.7 05/09/2014   BUN 8 05/09/2014   CO2 30 05/09/2014   TSH 1.69 05/09/2014   INR 1.33 02/21/2014   HGBA1C 5.7 07/27/2013      Assessment & Plan:

## 2014-09-30 NOTE — Assessment & Plan Note (Signed)
The patient is here for annual Medicare wellness examination and management of other chronic and acute problems.   The risk factors are reflected in the social history.  The roster of all physicians providing medical care to patient - is listed in the Snapshot section of the chart.  Activities of daily living:  The patient is 100% inedpendent in all ADLs: dressing, toileting, feeding as well as independent mobility  Home safety : The patient has smoke detectors in the home. They wear seatbelts.No firearms at home ( firearms are present in the home, kept in a safe fashion). There is no violence in the home.   There is no risks for hepatitis, STDs or HIV. There is no   history of blood transfusion. They have no travel history to infectious disease endemic areas of the world.  The patient has (has not) seen their dentist in the last six month. They have (not) seen their eye doctor in the last year. They deny (admit to) any hearing difficulty and have not had audiologic testing in the last year.  They do not  have excessive sun exposure. Discussed the need for sun protection: hats, long sleeves and use of sunscreen if there is significant sun exposure.   Diet: the importance of a healthy diet is discussed. They do have a healthy (unhealthy-high fat/fast food) diet.  The benefits of regular aerobic exercise were discussed.  Depression screen: there are no signs or vegative symptoms of depression- irritability, change in appetite, anhedonia, sadness/tearfullness.  Cognitive assessment: the patient manages all their financial and personal affairs and is actively engaged. They could relate day,date,year and events; recalled 3/3 objects at 3 minutes; performed clock-face test normally.  The following portions of the patient's history were reviewed and updated as appropriate: allergies, current medications, past family history, past medical history,  past surgical history, past social history  and  problem list.  Vision, hearing, body mass index were assessed and reviewed.   During the course of the visit the patient was educated and counseled about appropriate screening and preventive services including : fall prevention , diabetes screening, nutrition counseling, colorectal cancer screening, and recommended immunizations.  

## 2014-09-30 NOTE — Assessment & Plan Note (Signed)
I will recheck her AFP level today

## 2014-09-30 NOTE — Assessment & Plan Note (Signed)
She will cont xanax as needed 

## 2014-09-30 NOTE — Assessment & Plan Note (Signed)
Her BP is too high I will check her UDS for stimulants Will check her labs for secondary causes and end organ damage Will add HCTZ to her current regimen for better BP control

## 2014-09-30 NOTE — Assessment & Plan Note (Signed)
I will recheck her TSH level and will adjust her dose if needed

## 2014-09-30 NOTE — Assessment & Plan Note (Signed)
She will use an albuterol inhaler as needed

## 2014-10-01 LAB — DRUGS OF ABUSE SCREEN W/O ALC, ROUTINE URINE
Amphetamine Screen, Ur: NEGATIVE
Barbiturate Quant, Ur: NEGATIVE
Benzodiazepines.: POSITIVE — AB
Cocaine Metabolites: NEGATIVE
Creatinine,U: 49 mg/dL
Marijuana Metabolite: NEGATIVE
Methadone: NEGATIVE
Opiate Screen, Urine: POSITIVE — AB
PHENCYCLIDINE (PCP): NEGATIVE
PROPOXYPHENE: NEGATIVE

## 2014-10-01 LAB — AFP TUMOR MARKER: AFP TUMOR MARKER: 7.8 ng/mL — AB (ref <6.1)

## 2014-10-04 LAB — BENZODIAZEPINES (GC/LC/MS), URINE
Alprazolam metabolite (GC/LC/MS), ur confirm: 144 ng/mL — ABNORMAL HIGH (ref <25)
CLONAZEPAU: NEGATIVE ng/mL (ref <25)
FLURAZEPAMU: NEGATIVE ng/mL (ref <50)
Lorazepam (GC/LC/MS), ur confirm: NEGATIVE ng/mL (ref <50)
MIDAZOLAMU: NEGATIVE ng/mL (ref <50)
Nordiazepam (GC/LC/MS), ur confirm: NEGATIVE ng/mL (ref <50)
Oxazepam (GC/LC/MS), ur confirm: NEGATIVE ng/mL (ref <50)
Temazepam (GC/LC/MS), ur confirm: NEGATIVE ng/mL (ref <50)
Triazolam metabolite (GC/LC/MS), ur confirm: NEGATIVE ng/mL (ref <50)

## 2014-10-04 LAB — OPIATES/OPIOIDS (LC/MS-MS)
Codeine Urine: NEGATIVE ng/mL (ref <50)
HYDROCODONE: 7116 ng/mL — AB (ref <50)
Hydromorphone: 521 ng/mL — ABNORMAL HIGH (ref <50)
Morphine Urine: NEGATIVE ng/mL (ref <50)
Norhydrocodone, Ur: 7526 ng/mL — ABNORMAL HIGH (ref <50)
Noroxycodone, Ur: NEGATIVE ng/mL (ref <50)
Oxycodone, ur: NEGATIVE ng/mL (ref <50)
Oxymorphone: NEGATIVE ng/mL (ref <50)

## 2014-11-02 ENCOUNTER — Other Ambulatory Visit: Payer: Self-pay | Admitting: Internal Medicine

## 2015-01-29 ENCOUNTER — Telehealth: Payer: Self-pay

## 2015-01-29 NOTE — Telephone Encounter (Signed)
4/26; Call to confirm apt change to next Thursday.

## 2015-01-30 ENCOUNTER — Ambulatory Visit: Payer: Self-pay | Admitting: Internal Medicine

## 2015-01-30 ENCOUNTER — Other Ambulatory Visit: Payer: Self-pay | Admitting: Internal Medicine

## 2015-01-30 ENCOUNTER — Ambulatory Visit: Payer: Self-pay

## 2015-02-06 ENCOUNTER — Telehealth: Payer: Self-pay

## 2015-02-06 ENCOUNTER — Ambulatory Visit (INDEPENDENT_AMBULATORY_CARE_PROVIDER_SITE_OTHER): Payer: Medicare Other

## 2015-02-06 ENCOUNTER — Other Ambulatory Visit: Payer: Self-pay | Admitting: Internal Medicine

## 2015-02-06 VITALS — BP 138/70 | Ht 64.0 in | Wt 142.5 lb

## 2015-02-06 DIAGNOSIS — Z0000 Encounter for general adult medical examination without abnormal findings: Secondary | ICD-10-CM | POA: Diagnosis not present

## 2015-02-06 DIAGNOSIS — F418 Other specified anxiety disorders: Secondary | ICD-10-CM

## 2015-02-06 MED ORDER — ALPRAZOLAM 0.5 MG PO TABS
0.5000 mg | ORAL_TABLET | Freq: Three times a day (TID) | ORAL | Status: DC | PRN
Start: 2015-02-06 — End: 2015-04-01
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
## 2015-02-06 NOTE — Telephone Encounter (Signed)
Call back to the patient regarding refill of Alprazolam; Stated that Dr. Ronnald Ramp requested to see her in 4 months after her last office visit;  Apt scheduled  Today for  5/17 at 2pm and would like for Dr. Ronnald Ramp to refill xanax until she comes?   Is it ok to fill xanax 0.5 mg tid for anxiety  until her apt?

## 2015-02-06 NOTE — Patient Instructions (Addendum)
Phyllis [name] , Thank you for taking time to come for your Medicare Wellness Visit. I appreciate your ongoing commitment to your health goals. Please review the following plan we discussed and let me know if I can assist you in the future.   These are the goals we discussed: Goals    . Quit smoking / using tobacco     Smoking;  Educated to avoid secondary smoke Smoking cessation at [facility]: [phone] Meds may help; chatix (Varenicline); Zyban (Bupropion SR); Nicotine Replacement (gum; lozenges; patches; etc.) [place] quit line 1-800-QUIT-NOW 918-097-6484).           This is a list of the screening recommended for you and due dates:  Health Maintenance  Topic Date Due  . Urine Protein Check  07/19/1966  . Eye exam for diabetics  06/15/2013  . Hemoglobin A1C  01/25/2014  . Complete foot exam   03/26/2014  . Pap Smear  04/14/2014  . Shingles Vaccine  07/19/2014  . Mammogram  01/02/2015  . Flu Shot  05/05/2015  . Colon Cancer Screening  02/10/2017  . Pneumococcal vaccine (2) 08/23/2017  . Tetanus Vaccine  08/06/2020  . HIV Screening  Completed   Discussed overdue health Eye check would be due in July 2016 Foot exam: no issues today; will defer for MD visit Pap smear; to be done Bone density; had one shot of prolia; will [place] [id number] Map    Stress Stress-related medical problems are becoming increasingly common. The body has a built-in physical response to stressful situations. Faced with pressure, challenge or danger, we need to react quickly. Our bodies release hormones such as cortisol and adrenaline to help do this. These hormones are part of the "fight or flight" response and affect the metabolic rate, heart rate and blood pressure, resulting in a heightened, stressed state that prepares the body for optimum performance in dealing with a stressful situation. It is likely that early man required these mechanisms to  stay alive, but usually modern stresses do not call for this, and the same hormones released in today's world can damage health and reduce coping ability. CAUSES  Pressure to perform at work, at school or in sports.  Threats of physical violence.  Money worries.  Arguments.  Family conflicts.  Divorce or separation from significant other.  Bereavement.  New job or unemployment.  Changes in location.  Alcohol or drug abuse. SOMETIMES, THERE IS NO PARTICULAR REASON FOR DEVELOPING STRESS. Almost all people are at risk of being stressed at some time in their lives. It is important to know that some stress is temporary and some is long term.  Temporary stress will go away when a situation is resolved. Most people can cope with short periods of stress, and it can often be relieved by relaxing, taking a walk or getting any type of exercise, chatting through issues with friends, or having a good night's sleep.  Chronic (long-term, continuous) stress is much harder to deal with. It can be psychologically and emotionally damaging. It can be harmful both for an individual and for friends and family. SYMPTOMS Everyone reacts to stress differently. There are some common effects that help [place] recognize it. In times of extreme stress, people may:  Shake uncontrollably.  Breathe faster and deeper than normal (hyperventilate).  Vomit.  For people with asthma, stress can trigger an attack.  For some people, stress may trigger migraine headaches, ulcers, and body pain. PHYSICAL  EFFECTS OF STRESS MAY INCLUDE:  Loss of energy.  Skin problems.  Aches and pains resulting from tense muscles, including neck ache, backache and tension headaches.  Increased pain from arthritis and other conditions.  Irregular heart beat (palpitations).  Periods of irritability or anger.  Apathy or depression.  Anxiety (feeling uptight or worrying).  Unusual behavior.  Loss of appetite.  Comfort  eating.  Lack of concentration.  Loss of, or decreased, sex-drive.  Increased smoking, drinking, or recreational drug use.  For women, missed periods.  Ulcers, joint pain, and muscle pain. Post-traumatic stress is the stress caused by any serious accident, strong emotional damage, or extremely difficult or violent experience such as rape or war. Post-traumatic stress victims can experience mixtures of emotions such as fear, shame, depression, guilt or anger. It may include recurrent memories or images that may be haunting. These feelings can last for weeks, months or even years after the traumatic event that triggered them. Specialized treatment, possibly with medicines and psychological therapies, is available. If stress is causing physical symptoms, severe distress or making it difficult for you to function as normal, it is worth seeing your caregiver. It is important to remember that although stress is a usual part of life, extreme or prolonged stress can lead to other illnesses that will need treatment. It is better to visit a doctor sooner rather than later. Stress has been linked to the development of high blood pressure and heart disease, as well as insomnia and depression. There is no diagnostic test for stress since everyone reacts to it differently. But a caregiver will be able to spot the physical symptoms, such as:  Headaches.  Shingles.  Ulcers. Emotional distress such as intense worry, low mood or irritability should be detected when the doctor asks pertinent questions to identify any underlying problems that might be the cause. In case there are physical reasons for the symptoms, the doctor may also want to do some tests to exclude certain conditions. If you feel that you are suffering from stress, try to identify the aspects of your life that are causing it. Sometimes you may not be able to change or avoid them, but even a small change can have a positive ripple effect. A simple  lifestyle change can make all the difference. STRATEGIES THAT CAN HELP DEAL WITH STRESS:  Delegating or sharing responsibilities.  Avoiding confrontations.  Learning to be more assertive.  Regular exercise.  Avoid using alcohol or street drugs to cope.  Eating a healthy, balanced diet, rich in fruit and vegetables and proteins.  Finding humor or absurdity in stressful situations.  Never taking on more than you know you can handle comfortably.  Organizing your time better to get as much done as possible.  Talking to friends or family and sharing your thoughts and fears.  Listening to music or relaxation tapes.  Relaxation techniques like deep breathing, meditation, and yoga.  Tensing and then relaxing your muscles, starting at the toes and working up to the head and neck. If you think that you would benefit from help, either in identifying the things that are causing your stress or in learning techniques to help you relax, see a caregiver who is capable of helping you with this. Rather than relying on medications, it is usually better to try and identify the things in your life that are causing stress and try to deal with them. There are many techniques of managing stress including counseling, psychotherapy, aromatherapy, yoga, and exercise. Your caregiver  can help you determine what is best for you. Document Released: 12/11/2002 Document Revised: 09/25/2013 Document Reviewed: 11/07/2007 [facility] Patient Information 2015 [place], [place]. This information is not intended to replace advice given to you by your health care provider. Make sure you discuss any questions you have with your health care provider.    Health Maintenance Adopting a healthy lifestyle and getting preventive care can go a long way to promote health and wellness. Talk with your health care provider about what schedule of regular examinations is right for you. This is a good chance for you to check in with your  provider about disease prevention and staying healthy. In between checkups, there are plenty of things you can do on your own. Experts have done a lot of research about which lifestyle changes and preventive measures are most likely to keep you healthy. Ask your health care provider for more information. WEIGHT AND DIET  Eat a healthy diet  Be sure to include plenty of vegetables, fruits, low-fat dairy products, and lean protein.  Do not eat a lot of foods high in solid fats, added sugars, or salt.  Get regular exercise. This is one of the most important things you can do for your health.  Most adults should exercise for at least 150 minutes each week. The exercise should increase your heart rate and make you sweat (moderate-intensity exercise).  Most adults should also do strengthening exercises at least twice a week. This is in addition to the moderate-intensity exercise.  Maintain a healthy weight  Body mass index (BMI) is a measurement that can be used to identify possible weight problems. It estimates body fat based on height and weight. Your health care provider can help determine your BMI and help you achieve or maintain a healthy weight.  For females 78 years of age and older:   A BMI below 18.5 is considered underweight.  A BMI of 18.5 to 24.9 is normal.  A BMI of 25 to 29.9 is considered overweight.  A BMI of 30 and above is considered obese.  Watch levels of cholesterol and blood lipids  You should start having your blood tested for lipids and cholesterol at 61 years of age, then have this test every 5 years.  You may need to have your cholesterol levels checked more often if:  Your lipid or cholesterol levels are high.  You are older than 61 years of age.  You are at high risk for heart disease.  CANCER SCREENING   Lung Cancer  Lung cancer screening is recommended for adults 42-47 years old who are at high risk for lung cancer because of a history of  smoking.  A yearly low-dose CT scan of the lungs is recommended for people who:  Currently smoke.  Have quit within the past 15 years.  Have at least a 30-pack-year history of smoking. A pack year is smoking an average of one pack of cigarettes a day for 1 year.  Yearly screening should continue until it has been 15 years since you quit.  Yearly screening should stop if you develop a health problem that would prevent you from having lung cancer treatment.  Breast Cancer  Practice breast self-awareness. This means understanding how your breasts normally appear and feel.  It also means doing regular breast self-exams. Let your health care provider know about any changes, no matter how small.  If you are in your 20s or 30s, you should have a clinical breast exam (CBE) by a  health care provider every 1-3 years as part of a regular health exam.  If you are 33 or older, have a CBE every year. Also consider having a breast X-ray (mammogram) every year.  If you have a family history of breast cancer, talk to your health care provider about genetic screening.  If you are at high risk for breast cancer, talk to your health care provider about having an MRI and a mammogram every year.  Breast cancer gene (BRCA) assessment is recommended for women who have family members with BRCA-related cancers. BRCA-related cancers include:  Breast.  Ovarian.  Tubal.  Peritoneal cancers.  Results of the assessment will determine the need for genetic counseling and BRCA1 and BRCA2 testing. Cervical Cancer Routine pelvic examinations to screen for cervical cancer are no longer recommended for nonpregnant women who are considered low risk for cancer of the pelvic organs (ovaries, uterus, and vagina) and who do not have symptoms. A pelvic examination may be necessary if you have symptoms including those associated with pelvic infections. Ask your health care provider if a screening pelvic exam is right for  you.   The Pap test is the screening test for cervical cancer for women who are considered at risk.  If you had a hysterectomy for a problem that was not cancer or a condition that could lead to cancer, then you no longer need Pap tests.  If you are older than 65 years, and you have had normal Pap tests for the past 10 years, you no longer need to have Pap tests.  If you have had past treatment for cervical cancer or a condition that could lead to cancer, you need Pap tests and screening for cancer for at least 20 years after your treatment.  If you no longer get a Pap test, assess your risk factors if they change (such as having a new sexual partner). This can affect whether you should start being screened again.  Some women have medical problems that increase their chance of getting cervical cancer. If this is the case for you, your health care provider may recommend more frequent screening and Pap tests.  The human papillomavirus (HPV) test is another test that may be used for cervical cancer screening. The HPV test looks for the virus that can cause cell changes in the cervix. The cells collected during the Pap test can be tested for HPV.  The HPV test can be used to screen women 40 years of age and older. Getting tested for HPV can extend the interval between normal Pap tests from three to five years.  An HPV test also should be used to screen women of any age who have unclear Pap test results.  After 61 years of age, women should have HPV testing as often as Pap tests.  Colorectal Cancer  This type of cancer can be detected and often prevented.  Routine colorectal cancer screening usually begins at 61 years of age and continues through 61 years of age.  Your health care provider may recommend screening at an earlier age if you have risk factors for colon cancer.  Your health care provider may also recommend using home test kits to check for hidden blood in the stool.  A small  camera at the end of a tube can be used to examine your colon directly (sigmoidoscopy or colonoscopy). This is done to check for the earliest forms of colorectal cancer.  Routine screening usually begins at age 70.  Direct examination of  the colon should be repeated every 5-10 years through 61 years of age. However, you may need to be screened more often if early forms of precancerous polyps or small growths are found. Skin Cancer  Check your skin from head to toe regularly.  Tell your health care provider about any new moles or changes in moles, especially if there is a change in a mole's shape or color.  Also tell your health care provider if you have a mole that is larger than the size of a pencil eraser.  Always use sunscreen. Apply sunscreen liberally and repeatedly throughout the day.  Protect yourself by wearing long sleeves, pants, a wide-brimmed hat, and sunglasses whenever you are outside. HEART DISEASE, DIABETES, AND HIGH BLOOD PRESSURE   Have your blood pressure checked at least every 1-2 years. High blood pressure causes heart disease and increases the risk of stroke.  If you are between 17 years and 26 years old, ask your health care provider if you should take aspirin to prevent strokes.  Have regular diabetes screenings. This involves taking a blood sample to check your fasting blood sugar level.  If you are at a normal weight and have a low risk for diabetes, have this test once every three years after 61 years of age.  If you are overweight and have a high risk for diabetes, consider being tested at a younger age or more often. PREVENTING INFECTION  Hepatitis B  If you have a higher risk for hepatitis B, you should be screened for this virus. You are considered at high risk for hepatitis B if:  You were born in a country where hepatitis B is common. Ask your health care provider which countries are considered high risk.  Your parents were born in a high-risk  country, and you have not been immunized against hepatitis B (hepatitis B vaccine).  You have HIV or AIDS.  You use needles to inject street drugs.  You live with someone who has hepatitis B.  You have had sex with someone who has hepatitis B.  You get hemodialysis treatment.  You take certain medicines for conditions, including cancer, organ transplantation, and autoimmune conditions. Hepatitis C  Blood testing is recommended for:  Everyone born from [birth year] through 1967.  Anyone with known risk factors for hepatitis C. Sexually transmitted infections (STIs)  You should be screened for sexually transmitted infections (STIs) including gonorrhea and chlamydia if:  You are sexually active and are younger than 61 years of age.  You are older than 62 years of age and your health care provider tells you that you are at risk for this type of infection.  Your sexual activity has changed since you were last screened and you are at an increased risk for chlamydia or gonorrhea. Ask your health care provider if you are at risk.  If you do not have HIV, but are at risk, it may be recommended that you take a prescription medicine daily to prevent HIV infection. This is called pre-exposure prophylaxis (PrEP). You are considered at risk if:  You are sexually active and do not regularly use condoms or know the HIV status of your partner(s).  You take drugs by injection.  You are sexually active with a partner who has HIV. Talk with your health care provider about whether you are at high risk of being infected with HIV. If you choose to begin PrEP, you should first be tested for HIV. You should then be tested every 3 months  for as long as you are taking PrEP.  PREGNANCY   If you are premenopausal and you may become pregnant, ask your health care provider about preconception counseling.  If you may become pregnant, take 400 to 800 micrograms (mcg) of folic acid every day.  If you want to  prevent pregnancy, talk to your health care provider about birth control (contraception). OSTEOPOROSIS AND MENOPAUSE   Osteoporosis is a disease in which the bones lose minerals and strength with aging. This can result in serious bone fractures. Your risk for osteoporosis can be identified using a bone density scan.  If you are 7 years of age or older, or if you are at risk for osteoporosis and fractures, ask your health care provider if you should be screened.  Ask your health care provider whether you should take a calcium or vitamin D supplement to lower your risk for osteoporosis.  Menopause may have certain physical symptoms and risks.  Hormone replacement therapy may reduce some of these symptoms and risks. Talk to your health care provider about whether hormone replacement therapy is right for you.  HOME CARE INSTRUCTIONS   Schedule regular health, dental, and eye exams.  Stay current with your immunizations.   Do not use any tobacco products including cigarettes, chewing tobacco, or electronic cigarettes.  If you are pregnant, do not drink alcohol.  If you are breastfeeding, limit how much and how often you drink alcohol.  Limit alcohol intake to no more than 1 drink per day for nonpregnant women. One drink equals 12 ounces of beer, 5 ounces of wine, or 1 ounces of hard liquor.  Do not use street drugs.  Do not share needles.  Ask your health care provider for help if you need support or information about quitting drugs.  Tell your health care provider if you often feel depressed.  Tell your health care provider if you have ever been abused or do not feel safe at home. Document Released: 04/05/2011 Document Revised: 02/04/2014 Document Reviewed: 08/22/2013 [facility] Patient Information 2015 [place], [place]. This information is not intended to replace advice given to you by your health care provider. Make sure you discuss any questions you have with your health care  provider.  Thank you for enrolling in [place]. Please follow the instructions below to securely access your online medical record. MyChart allows you to send messages to your doctor, view your test results, manage appointments, and more.   How Do I Sign Up? 1. In your Internet browser, go to [employer] and enter [email or web address]. 2. Click on the Sign Up Now link in the Sign In box. You will see the New Member Sign Up page. 3. Enter your MyChart Access Code exactly as it appears below. You will not need to use this code after you've completed the sign-up process. If you do not sign up before the expiration date, you must request a new code.  MyChart Access Code: [email or web address] Expires: 04/06/2015  3:28 AM  4. Enter your Social Security Number ([email or web address]) and Date of Birth (mm/dd/yyyy) as indicated and click Submit. You will be taken to the next sign-up page. 5. Create a MyChart ID. This will be your MyChart login ID and cannot be changed, so think of one that is secure and easy to remember. 6. Create a MyChart password. You can change your password at any time. 7. Enter your Password Reset Question and Answer. This can be used at a later time if you forget your password.  8. Enter your e-mail address. You will receive e-mail notification when new information is available in [place]. 9. Click Sign Up. You can now view your medical record.   Additional Information Remember, MyChart is NOT to be used for urgent needs. For medical emergencies, dial 911.

## 2015-02-07 ENCOUNTER — Other Ambulatory Visit: Payer: Self-pay | Admitting: Internal Medicine

## 2015-02-07 ENCOUNTER — Telehealth: Payer: Self-pay

## 2015-02-07 NOTE — Telephone Encounter (Signed)
Addendum to the note below:  Prescription for 0.05 po; 3 times as needed for anxiety; Dispense 60; no refills; Pharmacy notified

## 2015-02-07 NOTE — Telephone Encounter (Signed)
To scan last office visit from pain management; Dr. Vilinda Flake added to treatment team 08/22/2014 Confirmed Gabapentin 351m; take one capsule by mouth 2 times a day. Dispense 60; REfill x 2  Hydrocodone - acetaminophen 10-3233mper tablet; Take one tablet 4 to 5 times per day if needed for pain. 135 tablets at most over 30 days/ Dispense 135/ refill 0  Note states patient violated pain agreement; Patient rec'd another copy of the agreement; DC Venlafaxine due to non-compliance Will allow a 2 day early refill on hydrocodone but she will still have to wait; Enc efforts to quit smoking.

## 2015-02-07 NOTE — Telephone Encounter (Signed)
Call to the pharmacy to confirm receipt of the fax per Dr. Ronnald Ramp this am, but pharmacist states they did not receive the fax. Order  For Xanax 0.05 pol  3 times a day as needed for anxiety / 0 refills Will have to come to office apt prior to another refill.

## 2015-02-13 ENCOUNTER — Telehealth: Payer: Self-pay

## 2015-02-13 NOTE — Telephone Encounter (Signed)
Called and spoke with patient who states that she has not had time to schedule yet due to a family member needing surgery. Patient stated that she will most defiantly call and schedule an appointment today. We will check status later

## 2015-02-18 ENCOUNTER — Ambulatory Visit: Payer: Self-pay | Admitting: Internal Medicine

## 2015-03-27 ENCOUNTER — Inpatient Hospital Stay (HOSPITAL_COMMUNITY): Payer: Medicare Other

## 2015-03-27 ENCOUNTER — Emergency Department (HOSPITAL_COMMUNITY): Payer: Medicare Other

## 2015-03-27 ENCOUNTER — Encounter (HOSPITAL_COMMUNITY): Payer: Self-pay

## 2015-03-27 ENCOUNTER — Inpatient Hospital Stay (HOSPITAL_COMMUNITY)
Admission: EM | Admit: 2015-03-27 | Discharge: 2015-04-01 | DRG: 291 | Disposition: A | Discharge status: Home / Self Care | Payer: Medicare Other | Attending: Internal Medicine | Admitting: Internal Medicine

## 2015-03-27 DIAGNOSIS — Z9851 Tubal ligation status: Secondary | ICD-10-CM | POA: Diagnosis not present

## 2015-03-27 DIAGNOSIS — I509 Heart failure, unspecified: Secondary | ICD-10-CM | POA: Diagnosis not present

## 2015-03-27 DIAGNOSIS — G894 Chronic pain syndrome: Secondary | ICD-10-CM | POA: Diagnosis not present

## 2015-03-27 DIAGNOSIS — N183 Chronic kidney disease, stage 3 (moderate): Secondary | ICD-10-CM | POA: Diagnosis not present

## 2015-03-27 DIAGNOSIS — J449 Chronic obstructive pulmonary disease, unspecified: Secondary | ICD-10-CM | POA: Diagnosis not present

## 2015-03-27 DIAGNOSIS — G629 Polyneuropathy, unspecified: Secondary | ICD-10-CM | POA: Diagnosis not present

## 2015-03-27 DIAGNOSIS — I129 Hypertensive chronic kidney disease with stage 1 through stage 4 chronic kidney disease, or unspecified chronic kidney disease: Secondary | ICD-10-CM | POA: Diagnosis present

## 2015-03-27 DIAGNOSIS — R109 Unspecified abdominal pain: Secondary | ICD-10-CM

## 2015-03-27 DIAGNOSIS — I2723 Pulmonary hypertension due to lung diseases and hypoxia: Secondary | ICD-10-CM | POA: Diagnosis present

## 2015-03-27 DIAGNOSIS — I272 Other secondary pulmonary hypertension: Secondary | ICD-10-CM | POA: Diagnosis present

## 2015-03-27 DIAGNOSIS — J4489 Other specified chronic obstructive pulmonary disease: Secondary | ICD-10-CM | POA: Diagnosis present

## 2015-03-27 DIAGNOSIS — R079 Chest pain, unspecified: Secondary | ICD-10-CM | POA: Diagnosis not present

## 2015-03-27 DIAGNOSIS — K7460 Unspecified cirrhosis of liver: Secondary | ICD-10-CM | POA: Diagnosis not present

## 2015-03-27 DIAGNOSIS — F17210 Nicotine dependence, cigarettes, uncomplicated: Secondary | ICD-10-CM | POA: Diagnosis not present

## 2015-03-27 DIAGNOSIS — Z720 Tobacco use: Secondary | ICD-10-CM | POA: Diagnosis not present

## 2015-03-27 DIAGNOSIS — Z888 Allergy status to other drugs, medicaments and biological substances status: Secondary | ICD-10-CM

## 2015-03-27 DIAGNOSIS — Z9049 Acquired absence of other specified parts of digestive tract: Secondary | ICD-10-CM | POA: Diagnosis not present

## 2015-03-27 DIAGNOSIS — I2510 Atherosclerotic heart disease of native coronary artery without angina pectoris: Secondary | ICD-10-CM | POA: Diagnosis not present

## 2015-03-27 DIAGNOSIS — I5031 Acute diastolic (congestive) heart failure: Secondary | ICD-10-CM | POA: Diagnosis not present

## 2015-03-27 DIAGNOSIS — D696 Thrombocytopenia, unspecified: Secondary | ICD-10-CM | POA: Diagnosis present

## 2015-03-27 DIAGNOSIS — J45909 Unspecified asthma, uncomplicated: Secondary | ICD-10-CM | POA: Diagnosis present

## 2015-03-27 DIAGNOSIS — J8410 Pulmonary fibrosis, unspecified: Secondary | ICD-10-CM | POA: Diagnosis not present

## 2015-03-27 DIAGNOSIS — Z716 Tobacco abuse counseling: Secondary | ICD-10-CM | POA: Diagnosis present

## 2015-03-27 DIAGNOSIS — R0602 Shortness of breath: Secondary | ICD-10-CM

## 2015-03-27 DIAGNOSIS — J439 Emphysema, unspecified: Secondary | ICD-10-CM | POA: Diagnosis not present

## 2015-03-27 DIAGNOSIS — J849 Interstitial pulmonary disease, unspecified: Secondary | ICD-10-CM

## 2015-03-27 DIAGNOSIS — E039 Hypothyroidism, unspecified: Secondary | ICD-10-CM | POA: Diagnosis present

## 2015-03-27 DIAGNOSIS — E785 Hyperlipidemia, unspecified: Secondary | ICD-10-CM | POA: Diagnosis not present

## 2015-03-27 DIAGNOSIS — I313 Pericardial effusion (noninflammatory): Secondary | ICD-10-CM | POA: Diagnosis not present

## 2015-03-27 DIAGNOSIS — B1920 Unspecified viral hepatitis C without hepatic coma: Secondary | ICD-10-CM | POA: Diagnosis present

## 2015-03-27 DIAGNOSIS — N179 Acute kidney failure, unspecified: Secondary | ICD-10-CM | POA: Diagnosis present

## 2015-03-27 DIAGNOSIS — J9601 Acute respiratory failure with hypoxia: Secondary | ICD-10-CM | POA: Diagnosis present

## 2015-03-27 DIAGNOSIS — B182 Chronic viral hepatitis C: Secondary | ICD-10-CM | POA: Diagnosis not present

## 2015-03-27 DIAGNOSIS — R1084 Generalized abdominal pain: Secondary | ICD-10-CM | POA: Diagnosis not present

## 2015-03-27 DIAGNOSIS — I739 Peripheral vascular disease, unspecified: Secondary | ICD-10-CM | POA: Diagnosis not present

## 2015-03-27 DIAGNOSIS — IMO0001 Reserved for inherently not codable concepts without codable children: Secondary | ICD-10-CM

## 2015-03-27 DIAGNOSIS — Z8619 Personal history of other infectious and parasitic diseases: Secondary | ICD-10-CM

## 2015-03-27 DIAGNOSIS — K219 Gastro-esophageal reflux disease without esophagitis: Secondary | ICD-10-CM | POA: Diagnosis not present

## 2015-03-27 DIAGNOSIS — B370 Candidal stomatitis: Secondary | ICD-10-CM

## 2015-03-27 DIAGNOSIS — I10 Essential (primary) hypertension: Secondary | ICD-10-CM

## 2015-03-27 DIAGNOSIS — F329 Major depressive disorder, single episode, unspecified: Secondary | ICD-10-CM | POA: Diagnosis present

## 2015-03-27 DIAGNOSIS — I5033 Acute on chronic diastolic (congestive) heart failure: Secondary | ICD-10-CM | POA: Diagnosis not present

## 2015-03-27 DIAGNOSIS — F17200 Nicotine dependence, unspecified, uncomplicated: Secondary | ICD-10-CM | POA: Diagnosis present

## 2015-03-27 DIAGNOSIS — R7989 Other specified abnormal findings of blood chemistry: Secondary | ICD-10-CM | POA: Diagnosis not present

## 2015-03-27 DIAGNOSIS — R0600 Dyspnea, unspecified: Secondary | ICD-10-CM | POA: Diagnosis not present

## 2015-03-27 DIAGNOSIS — F418 Other specified anxiety disorders: Secondary | ICD-10-CM

## 2015-03-27 DIAGNOSIS — K861 Other chronic pancreatitis: Secondary | ICD-10-CM | POA: Diagnosis not present

## 2015-03-27 DIAGNOSIS — I517 Cardiomegaly: Secondary | ICD-10-CM | POA: Diagnosis not present

## 2015-03-27 DIAGNOSIS — E876 Hypokalemia: Secondary | ICD-10-CM

## 2015-03-27 DIAGNOSIS — J441 Chronic obstructive pulmonary disease with (acute) exacerbation: Secondary | ICD-10-CM | POA: Insufficient documentation

## 2015-03-27 DIAGNOSIS — R778 Other specified abnormalities of plasma proteins: Secondary | ICD-10-CM | POA: Diagnosis present

## 2015-03-27 DIAGNOSIS — I270 Primary pulmonary hypertension: Secondary | ICD-10-CM | POA: Diagnosis not present

## 2015-03-27 DIAGNOSIS — J432 Centrilobular emphysema: Secondary | ICD-10-CM | POA: Diagnosis not present

## 2015-03-27 DIAGNOSIS — K7469 Other cirrhosis of liver: Secondary | ICD-10-CM | POA: Diagnosis not present

## 2015-03-27 LAB — CBC WITH DIFFERENTIAL/PLATELET
BASOS ABS: 0 10*3/uL (ref 0.0–0.1)
BASOS PCT: 0 % (ref 0–1)
EOS ABS: 0 10*3/uL (ref 0.0–0.7)
Eosinophils Relative: 0 % (ref 0–5)
HCT: 40.5 % (ref 36.0–46.0)
Hemoglobin: 12.5 g/dL (ref 12.0–15.0)
Lymphocytes Relative: 30 % (ref 12–46)
Lymphs Abs: 1.6 10*3/uL (ref 0.7–4.0)
MCH: 31.1 pg (ref 26.0–34.0)
MCHC: 30.9 g/dL (ref 30.0–36.0)
MCV: 100.7 fL — ABNORMAL HIGH (ref 78.0–100.0)
Monocytes Absolute: 0.7 10*3/uL (ref 0.1–1.0)
Monocytes Relative: 12 % (ref 3–12)
NEUTROS ABS: 3 10*3/uL (ref 1.7–7.7)
Neutrophils Relative %: 58 % (ref 43–77)
Platelets: 130 10*3/uL — ABNORMAL LOW (ref 150–400)
RBC: 4.02 MIL/uL (ref 3.87–5.11)
RDW: 15.3 % (ref 11.5–15.5)
WBC: 5.3 10*3/uL (ref 4.0–10.5)

## 2015-03-27 LAB — I-STAT TROPONIN, ED: TROPONIN I, POC: 0.13 ng/mL — AB (ref 0.00–0.08)

## 2015-03-27 LAB — BASIC METABOLIC PANEL
ANION GAP: 9 (ref 5–15)
ANION GAP: 4 — AB (ref 5–15)
BUN: 19 mg/dL (ref 6–20)
BUN: 20 mg/dL (ref 6–20)
CALCIUM: 8.9 mg/dL (ref 8.9–10.3)
CHLORIDE: 103 mmol/L (ref 101–111)
CO2: 24 mmol/L (ref 22–32)
CO2: 25 mmol/L (ref 22–32)
CREATININE: 1.37 mg/dL — AB (ref 0.44–1.00)
Calcium: 8.9 mg/dL (ref 8.9–10.3)
Chloride: 107 mmol/L (ref 101–111)
Creatinine, Ser: 1.27 mg/dL — ABNORMAL HIGH (ref 0.44–1.00)
GFR calc Af Amer: 48 mL/min — ABNORMAL LOW (ref >60)
GFR calc non Af Amer: 41 mL/min — ABNORMAL LOW (ref >60)
GFR, EST AFRICAN AMERICAN: 52 mL/min — AB (ref >60)
GFR, EST NON AFRICAN AMERICAN: 45 mL/min — AB (ref >60)
GLUCOSE: 92 mg/dL (ref 65–99)
GLUCOSE: 88 mg/dL (ref 65–99)
Potassium: 5.2 mmol/L — ABNORMAL HIGH (ref 3.5–5.1)
Potassium: 4.6 mmol/L (ref 3.5–5.1)
SODIUM: 136 mmol/L (ref 135–145)
Sodium: 136 mmol/L (ref 135–145)

## 2015-03-27 LAB — URINALYSIS, ROUTINE W REFLEX MICROSCOPIC
BILIRUBIN URINE: NEGATIVE
GLUCOSE, UA: NEGATIVE mg/dL
HGB URINE DIPSTICK: NEGATIVE
Ketones, ur: NEGATIVE mg/dL
Nitrite: NEGATIVE
PH: 6 (ref 5.0–8.0)
Protein, ur: NEGATIVE mg/dL
Specific Gravity, Urine: 1.012 (ref 1.005–1.030)
Urobilinogen, UA: 0.2 mg/dL (ref 0.0–1.0)

## 2015-03-27 LAB — URINE MICROSCOPIC-ADD ON

## 2015-03-27 LAB — PROTIME-INR
INR: 1.64 — ABNORMAL HIGH (ref 0.00–1.49)
Prothrombin Time: 19.4 seconds — ABNORMAL HIGH (ref 11.6–15.2)

## 2015-03-27 LAB — HEPATIC FUNCTION PANEL
ALT: 17 U/L (ref 14–54)
AST: 39 U/L (ref 15–41)
Albumin: 3.6 g/dL (ref 3.5–5.0)
Alkaline Phosphatase: 92 U/L (ref 38–126)
BILIRUBIN INDIRECT: 0.5 mg/dL (ref 0.3–0.9)
Bilirubin, Direct: 0.4 mg/dL (ref 0.1–0.5)
TOTAL PROTEIN: 6.9 g/dL (ref 6.5–8.1)
Total Bilirubin: 0.9 mg/dL (ref 0.3–1.2)

## 2015-03-27 LAB — BRAIN NATRIURETIC PEPTIDE: B Natriuretic Peptide: 1043 pg/mL — ABNORMAL HIGH (ref 0.0–100.0)

## 2015-03-27 LAB — TROPONIN I
TROPONIN I: 0.35 ng/mL — AB (ref <0.031)
Troponin I: 0.25 ng/mL — ABNORMAL HIGH (ref <0.031)

## 2015-03-27 LAB — APTT: APTT: 29 s (ref 24–37)

## 2015-03-27 MED ORDER — GABAPENTIN 300 MG PO CAPS
300.0000 mg | ORAL_CAPSULE | Freq: Two times a day (BID) | ORAL | Status: DC
  Administered 2015-03-27 – 2015-04-01 (×10): 300 mg via ORAL
  Filled 2015-03-27 (×10): qty 1

## 2015-03-27 MED ORDER — ONDANSETRON HCL 4 MG PO TABS: 4.0000 mg | ORAL_TABLET | Freq: Four times a day (QID) | ORAL | Status: DC | PRN

## 2015-03-27 MED ORDER — OXYCODONE HCL 5 MG PO TABS
15.0000 mg | ORAL_TABLET | Freq: Four times a day (QID) | ORAL | Status: DC | PRN
  Administered 2015-03-27 – 2015-04-01 (×19): 15 mg via ORAL
  Filled 2015-03-27 (×18): qty 3

## 2015-03-27 MED ORDER — ACETAMINOPHEN 325 MG PO TABS: 650.0000 mg | ORAL_TABLET | Freq: Four times a day (QID) | ORAL | Status: DC | PRN

## 2015-03-27 MED ORDER — HEPARIN BOLUS VIA INFUSION
1800.0000 [IU] | Freq: Once | INTRAVENOUS | Status: AC
  Administered 2015-03-27: 1800 [IU] via INTRAVENOUS
  Filled 2015-03-27: qty 1800

## 2015-03-27 MED ORDER — ATORVASTATIN CALCIUM 40 MG PO TABS
40.0000 mg | ORAL_TABLET | Freq: Every day | ORAL | Status: DC
  Administered 2015-03-28 – 2015-03-31 (×4): 40 mg via ORAL
  Filled 2015-03-27 (×5): qty 1

## 2015-03-27 MED ORDER — FUROSEMIDE 10 MG/ML IJ SOLN
20.0000 mg | Freq: Two times a day (BID) | INTRAMUSCULAR | Status: DC
  Administered 2015-03-27 – 2015-03-31 (×8): 20 mg via INTRAVENOUS
  Filled 2015-03-27 (×3): qty 2
  Filled 2015-03-27: qty 4
  Filled 2015-03-27 (×4): qty 2

## 2015-03-27 MED ORDER — ACETAMINOPHEN 650 MG RE SUPP: 650.0000 mg | Freq: Four times a day (QID) | RECTAL | Status: DC | PRN

## 2015-03-27 MED ORDER — HYDROMORPHONE HCL 1 MG/ML IJ SOLN
1.0000 mg | Freq: Once | INTRAMUSCULAR | Status: AC
  Administered 2015-03-27: 1 mg via INTRAVENOUS
  Filled 2015-03-27: qty 1

## 2015-03-27 MED ORDER — ONDANSETRON HCL 4 MG/2ML IJ SOLN: 4.0000 mg | Freq: Four times a day (QID) | INTRAMUSCULAR | Status: DC | PRN

## 2015-03-27 MED ORDER — ASPIRIN EC 325 MG PO TBEC
325.0000 mg | DELAYED_RELEASE_TABLET | Freq: Every day | ORAL | Status: DC
  Administered 2015-03-27 – 2015-04-01 (×6): 325 mg via ORAL
  Filled 2015-03-27 (×6): qty 1

## 2015-03-27 MED ORDER — METOPROLOL TARTRATE 25 MG PO TABS
25.0000 mg | ORAL_TABLET | Freq: Two times a day (BID) | ORAL | Status: DC
  Administered 2015-03-27 – 2015-03-28 (×3): 25 mg via ORAL
  Filled 2015-03-27 (×3): qty 1

## 2015-03-27 MED ORDER — POLYETHYLENE GLYCOL 3350 17 G PO PACK: 17.0000 g | PACK | Freq: Every day | ORAL | Status: DC | PRN

## 2015-03-27 MED ORDER — ALPRAZOLAM 0.5 MG PO TABS
0.5000 mg | ORAL_TABLET | Freq: Three times a day (TID) | ORAL | Status: DC | PRN
  Administered 2015-03-30 – 2015-04-01 (×2): 0.5 mg via ORAL
  Filled 2015-03-27 (×2): qty 1

## 2015-03-27 MED ORDER — POTASSIUM CHLORIDE CRYS ER 20 MEQ PO TBCR
20.0000 meq | EXTENDED_RELEASE_TABLET | Freq: Two times a day (BID) | ORAL | Status: DC
  Administered 2015-03-27 – 2015-04-01 (×10): 20 meq via ORAL
  Filled 2015-03-27 (×10): qty 1

## 2015-03-27 MED ORDER — HEPARIN (PORCINE) IN NACL 100-0.45 UNIT/ML-% IJ SOLN
1100.0000 [IU]/h | INTRAMUSCULAR | Status: DC
  Administered 2015-03-27 – 2015-03-28 (×2): 1100 [IU]/h via INTRAVENOUS
  Filled 2015-03-27 (×2): qty 250

## 2015-03-27 MED ORDER — ENOXAPARIN SODIUM 40 MG/0.4ML ~~LOC~~ SOLN
40.0000 mg | SUBCUTANEOUS | Status: DC
  Filled 2015-03-27: qty 0.4

## 2015-03-27 MED ORDER — IPRATROPIUM-ALBUTEROL 0.5-2.5 (3) MG/3ML IN SOLN
3.0000 mL | Freq: Once | RESPIRATORY_TRACT | Status: AC
  Administered 2015-03-27: 3 mL via RESPIRATORY_TRACT
  Filled 2015-03-27: qty 3

## 2015-03-27 MED ORDER — ASPIRIN EC 81 MG PO TBEC
81.0000 mg | DELAYED_RELEASE_TABLET | Freq: Every day | ORAL | Status: DC
  Filled 2015-03-27: qty 1

## 2015-03-27 NOTE — H&P (Addendum)
[facility] History and Physical  SOFI BRYARS HDQ:[id number] DOB: 04-25-1956 DOA: 03/27/2015  Referring physician: EDP PCP: [name], MD   Chief Complaint: swelling of face, abd, dyspnea  HPI: Phyllis [name] is a 59 y.o. female with PMH of Hep C -treated, COPD, tobacco abuse, Chronic pancreatitis and chronic back pain, HTN, presents to the ER with the above complaints. Patient reports progressive dyspnea on exertion for 2-3 weeks, over the past week she noticed facial swelling, especially periorbital, and abdominal swelling, distension. This is associated with non productive cough, and over the last 2-3days she started experiencing chest pain which she describes as heaviness/pressure like on and off. Denies any chest pain now. In ER, noted to have creatinine of 1.2, BNP 1043, troponin 0.13 and TRH consulted for further management   Review of Systems: positives bolded Constitutional:  No weight loss, night sweats, Fevers, chills, fatigue.  HEENT:  No headaches, Difficulty swallowing,Tooth/dental problems,Sore throat,  No sneezing, itching, ear ache, nasal congestion, post nasal drip,  Cardio-vascular:  No chest pain, Orthopnea, PND, swelling in lower extremities, anasarca, dizziness, palpitations  GI:  No heartburn, indigestion, abdominal pain, nausea, vomiting, diarrhea, change in bowel habits, loss of appetite  Resp:  No shortness of breath with exertion or at rest. No excess mucus, no productive cough,  non-productive cough, No coughing up of blood.No change in color of mucus.No wheezing.No chest wall deformity  Skin:  no rash or lesions.  GU:  no dysuria, change in color of urine, no urgency or frequency. No flank pain.  Musculoskeletal:  No joint pain or swelling. No decreased range of motion. No back pain.  Psych:  No change in mood or affect. No depression or anxiety. No memory loss.   Past Medical History  Diagnosis Date  . Eczema   . ASTHMA  06/06/2009  . BACK PAIN 08/06/2010  . Edema 05/20/2010  . GERD 06/06/2009  . HEPATITIS C WITHOUT HEPATIC COMA 06/06/2009  . HYPERTENSION 06/06/2009  . OSTEOPOROSIS 06/06/2009  . 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 TOBACCO USE 06/05/2010  . Unspecified hypothyroidism 06/05/2010  . VITAMIN D DEFICIENCY 06/06/2009  . Chronic pancreatitis   . DEPRESSION 06/06/2009    states she is not depressed   Past Surgical History  Procedure Laterality Date  . Cholecystectomy    . Breast lumpectomy      benign, right  . Tubal ligation    . Tonsillectomy    . Lumbar laminectomy    . Left heart catheterization with coronary angiogram N/A 03/08/2013    Procedure: LEFT HEART CATHETERIZATION WITH CORONARY ANGIOGRAM;  Surgeon: Clent Demark, MD;  Location: [facility] CATH LAB;  Service: Cardiovascular;  Laterality: N/A;   Social History:  reports that she has been smoking Cigarettes.  She has a 3.5 pack-year smoking history. She has never used smokeless tobacco. She reports that she does not drink alcohol or use illicit drugs.  Allergies  Allergen Reactions  . Meperidine Hcl Other (See Comments)    hallucinations  . Naproxen Other (See Comments)    Extreme bruising    Family History  Problem Relation Age of Onset  . COPD Father   . Hypertension Mother   . Kidney disease Mother   . Colon cancer Neg Hx   . Stomach cancer Neg Hx     Prior to Admission medications   Medication Sig Start Date End Date Taking? Authorizing Provider  Albuterol Sulfate (PROAIR RESPICLICK) 119 (90 BASE) MCG/ACT AEPB Inhale 1  puff into the lungs 4 (four) times daily as needed. Patient taking differently: Inhale 1 puff into the lungs 4 (four) times daily as needed (For shortness of breath.).  09/30/14  Yes [name], MD  ALPRAZolam Duanne Moron) 0.5 MG tablet Take 1 tablet (0.5 mg total) by mouth 3 (three) times daily as needed for anxiety. 02/06/15  Yes [name], MD  aspirin EC 81 MG tablet Take 81 mg by mouth at  bedtime.    Yes Historical Provider, MD  gabapentin (NEURONTIN) 300 MG capsule Take 300 mg by mouth 2 (two) times daily.   Yes Historical Provider, MD  hydrochlorothiazide (MICROZIDE) 12.5 MG capsule Take 1 capsule (12.5 mg total) by mouth daily. 09/30/14  Yes [name], MD  HYDROcodone-acetaminophen [facility]) 10-325 MG per tablet Take 1 tablet by mouth 5 (five) times daily as needed for moderate pain (Can take 4 to 5 a day prn per pain managment center 02/06/2015).    Yes Historical Provider, MD  ibuprofen (ADVIL,MOTRIN) 200 MG tablet Take 200-400 mg by mouth every 6 (six) hours as needed (for pain).    Yes Historical Provider, MD  losartan (COZAAR) 100 MG tablet Take 100 mg by mouth daily.   Yes Historical Provider, MD  metoprolol tartrate (LOPRESSOR) 25 MG tablet Take 25 mg by mouth 2 (two) times daily.   Yes Historical Provider, MD  oxyCODONE (ROXICODONE) 15 MG immediate release tablet Take 15 mg by mouth every 6 (six) hours as needed. For pain. 02/03/15  Yes Historical Provider, MD  triamcinolone ointment (KENALOG) 0.1 % Apply 1 application topically 2 (two) times daily. 05/09/14  Yes [name], MD  potassium chloride SA (KLOR-CON M20) 20 MEQ tablet Take 1 tablet (20 mEq total) by mouth 2 (two) times daily. Patient not taking: Reported on 03/27/2015 05/09/14   [name], MD   Physical Exam: Filed Vitals:   03/27/15 1012 03/27/15 1015 03/27/15 1015  BP: 148/81 159/83   Pulse: 82 80   Temp: 98.1 F (36.7 C)    TempSrc: Oral    Resp: 16 20   SpO2: 84%  100%    Wt Readings from Last 3 Encounters:  02/06/15 64.638 kg (142 lb 8 oz)  09/30/14 64.864 kg (143 lb)  05/09/14 64.411 kg (142 lb)    General:  Appears calm and comfortable, average built, no distress Eyes: PERRL, mild periorbital edema normal irises & conjunctiva ENT: grossly normal hearing, lips & tongue Neck: no LAD, masses or thyromegaly Cardiovascular: RRR, no m/r/g. No LE edema. Telemetry: SR, no arrhythmias    Respiratory: CTA bilaterally, no w/r/r. Normal respiratory effort. Abdomen: soft, NT, mild distension, BS present Skin: some old hyperpigmented scars, Chronic round hyper and hypopigmented lesion on RLE with some scaling Musculoskeletal: grossly normal tone BUE/BLE Psychiatric: grossly normal mood and affect, speech fluent and appropriate Neurologic: grossly non-focal.          Labs on Admission:  Basic Metabolic Panel:  Recent Labs Lab 03/27/15 1023 03/27/15 1227  NA 136 136  K 5.2* 4.6  CL 103 107  CO2 24 25  GLUCOSE 92 88  BUN 19 20  CREATININE 1.37* 1.27*  CALCIUM 8.9 8.9   Liver Function Tests: No results for input(s): AST, ALT, ALKPHOS, BILITOT, PROT, ALBUMIN in the last 168 hours. No results for input(s): LIPASE, AMYLASE in the last 168 hours. No results for input(s): AMMONIA in the last 168 hours. CBC:  Recent Labs Lab 03/27/15 1023  WBC 5.3  NEUTROABS 3.0  HGB 12.5  HCT 40.5  MCV 100.7*  PLT 130*   Cardiac Enzymes: No results for input(s): CKTOTAL, CKMB, CKMBINDEX, TROPONINI in the last 168 hours.  BNP (last 3 results)  Recent Labs  03/27/15 1024  BNP 1043.0*    ProBNP (last 3 results) No results for input(s): PROBNP in the last 8760 hours.  CBG: No results for input(s): GLUCAP in the last 168 hours.  Radiological Exams on Admission: Dg Chest 2 View  03/27/2015   CLINICAL DATA:  Chest pain and shortness of breath.  Chronic asthma  EXAM: CHEST  2 VIEW  COMPARISON:  Chest radiograph and chest CT Feb 21, 2014  FINDINGS: Chronic interstitial lung disease is noted diffusely, stable. There is no well-defined edema or consolidation. Heart is borderline enlarged with pulmonary vascularity within normal limits. No adenopathy. No bone lesions.  IMPRESSION: Chronic interstitial lung disease remains unchanged. No frank edema or consolidation. Heart borderline enlarged.   Electronically Signed   By: Lowella Grip III M.D.   On: 03/27/2015 11:45    EKG:  Independently reviewed. NSR, low voltage, borderline prolonged QTC  Assessment/Plan    Dyspnea/edema -suspect diastolic CHF -IV lasix -ECHO -Liver US -since h/o Hep C, eval for cirrhosis/ascites -add on LFTs, albumin -monitor I/Os, weights    Elevated troponin/Chest pain -ASA, denies any chest pain at this time, cycle cardiac enzymes -check 2 ECHO -Cards consulted per EDP    TOBACCO USE/COPD -stable, albuterol PRN -counseled    Essential hypertension, benign -hold cozaar, continue metoprolol    H/o Hepatitis C -completed Rx per pt -last AFP was >6 -check [place] to eval echotexture and r/o HCC    AKI -baseline normal at 0.7 -likely due to NSAIDs, stop this, ? cardiorenal -hold ARB, monitor with diuresis   Chronic pancreatitis/Chronic pain syndrome -continue Percocet per home regimen  Code Status: Full Code DVT Prophylaxis: lovenox Family Communication: husband at bedside Disposition Plan: inpatient  Time spent: 39mn  Celeste Tavenner [facility] Pager [phone]

## 2015-03-27 NOTE — ED Provider Notes (Signed)
CSN: [id number]     Arrival date & time 03/27/15  3846 History   First MD Initiated Contact with Patient 03/27/15 1005     Chief Complaint  Patient presents with  . Facial Swelling  . Chest Pain  . Abdominal Pain     (Consider location/radiation/quality/duration/timing/severity/associated sxs/prior Treatment) Patient is a 59 y.o. female presenting with cough.  Cough Cough characteristics:  Non-productive Severity:  Moderate Onset quality:  Gradual Duration:  2 weeks Timing:  Constant Progression:  Worsening Chronicity:  New Smoker: yes   Context: not upper respiratory infection   Relieved by:  Nothing Ineffective treatments:  None tried Associated symptoms: chest pain (sharp, substernal) and shortness of breath   Associated symptoms: no fever   Associated symptoms comment:  Abd pain as well   Past Medical History  Diagnosis Date  . Eczema   . ASTHMA 06/06/2009  . BACK PAIN 08/06/2010  . Edema 05/20/2010  . GERD 06/06/2009  . HEPATITIS C WITHOUT HEPATIC COMA 06/06/2009  . HYPERTENSION 06/06/2009  . OSTEOPOROSIS 06/06/2009  . 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 TOBACCO USE 06/05/2010  . Unspecified hypothyroidism 06/05/2010  . VITAMIN D DEFICIENCY 06/06/2009  . Chronic pancreatitis   . DEPRESSION 06/06/2009    states she is not depressed   Past Surgical History  Procedure Laterality Date  . Cholecystectomy    . Breast lumpectomy      benign, right  . Tubal ligation    . Tonsillectomy    . Lumbar laminectomy    . Left heart catheterization with coronary angiogram N/A 03/08/2013    Procedure: LEFT HEART CATHETERIZATION WITH CORONARY ANGIOGRAM;  Surgeon: Clent Demark, MD;  Location: [facility] CATH LAB;  Service: Cardiovascular;  Laterality: N/A;   Family History  Problem Relation Age of Onset  . COPD Father   . Hypertension Mother   . Kidney disease Mother   . Colon cancer Neg Hx   . Stomach cancer Neg Hx    History  Substance Use Topics  . Smoking  status: Current Every Day Smoker -- 0.10 packs/day for 35 years    Types: Cigarettes  . Smokeless tobacco: Never Used     Comment: Trying to quit;   . Alcohol Use: No   OB History    No data available     Review of Systems  Constitutional: Negative for fever.  Respiratory: Positive for cough and shortness of breath.   Cardiovascular: Positive for chest pain (sharp, substernal).  All other systems reviewed and are negative.     Allergies  Meperidine hcl and Naproxen  Home Medications   Prior to Admission medications   Medication Sig Start Date End Date Taking? Authorizing Provider  Albuterol Sulfate (PROAIR RESPICLICK) 659 (90 BASE) MCG/ACT AEPB Inhale 1 puff into the lungs 4 (four) times daily as needed. Patient taking differently: Inhale 1 puff into the lungs 4 (four) times daily as needed (For shortness of breath.).  09/30/14  Yes [name], MD  ALPRAZolam Duanne Moron) 0.5 MG tablet Take 1 tablet (0.5 mg total) by mouth 3 (three) times daily as needed for anxiety. 02/06/15  Yes [name], MD  aspirin EC 81 MG tablet Take 81 mg by mouth at bedtime.    Yes Historical Provider, MD  gabapentin (NEURONTIN) 300 MG capsule Take 300 mg by mouth 2 (two) times daily.   Yes Historical Provider, MD  hydrochlorothiazide (MICROZIDE) 12.5 MG capsule Take 1 capsule (12.5 mg total) by mouth daily. 09/30/14  Yes [name], MD  HYDROcodone-acetaminophen [facility]) 10-325 MG per tablet Take 1 tablet by mouth 5 (five) times daily as needed for moderate pain (Can take 4 to 5 a day prn per pain managment center 02/06/2015).    Yes Historical Provider, MD  ibuprofen (ADVIL,MOTRIN) 200 MG tablet Take 200-400 mg by mouth every 6 (six) hours as needed (for pain).    Yes Historical Provider, MD  losartan (COZAAR) 100 MG tablet Take 100 mg by mouth daily.   Yes Historical Provider, MD  metoprolol tartrate (LOPRESSOR) 25 MG tablet Take 25 mg by mouth 2 (two) times daily.   Yes Historical Provider, MD   oxyCODONE (ROXICODONE) 15 MG immediate release tablet Take 15 mg by mouth every 6 (six) hours as needed. For pain. 02/03/15  Yes Historical Provider, MD  triamcinolone ointment (KENALOG) 0.1 % Apply 1 application topically 2 (two) times daily. 05/09/14  Yes [name], MD  potassium chloride SA (KLOR-CON M20) 20 MEQ tablet Take 1 tablet (20 mEq total) by mouth 2 (two) times daily. Patient not taking: Reported on 03/27/2015 05/09/14   [name], MD   BP 105/50 mmHg  Pulse 79  Temp(Src) 98.6 F (37 C) (Oral)  Resp 16  Ht _0  (1.626 m)  Wt 158 lb 3.2 oz (71.759 kg)  BMI 27.14 kg/m2  SpO2 95%  LMP 10/05/1999 Physical Exam  Constitutional: She is oriented to person, place, and time. She appears well-developed and well-nourished.  HENT:  Head: Normocephalic and atraumatic.  Right Ear: External ear normal.  Left Ear: External ear normal.  Eyes: Conjunctivae and EOM are normal. Pupils are equal, round, and reactive to light.  Neck: Normal range of motion. Neck supple.  Cardiovascular: Normal rate, regular rhythm, normal heart sounds and intact distal pulses.   Pulmonary/Chest: Effort normal. She has wheezes (diffuse end expiratory).  Abdominal: Soft. Bowel sounds are normal. There is generalized tenderness.  Musculoskeletal: Normal range of motion.  Neurological: She is alert and oriented to person, place, and time.  Skin: Skin is warm and dry.  Vitals reviewed.   ED Course  Procedures (including critical care time) Labs Review Labs Reviewed  BASIC METABOLIC PANEL - Abnormal; Notable for the following:    Potassium 5.2 (*)    Creatinine, Ser 1.37 (*)    GFR calc non Af Amer 41 (*)    GFR calc Af Amer 48 (*)    All other components within normal limits  BRAIN NATRIURETIC PEPTIDE - Abnormal; Notable for the following:    B Natriuretic Peptide 1043.0 (*)    All other components within normal limits  CBC WITH DIFFERENTIAL/PLATELET - Abnormal; Notable for the following:    MCV  100.7 (*)    Platelets 130 (*)    All other components within normal limits  URINALYSIS, ROUTINE W REFLEX MICROSCOPIC (NOT AT [facility]) - Abnormal; Notable for the following:    APPearance CLOUDY (*)    Leukocytes, UA TRACE (*)    All other components within normal limits  BASIC METABOLIC PANEL - Abnormal; Notable for the following:    Creatinine, Ser 1.27 (*)    GFR calc non Af Amer 45 (*)    GFR calc Af Amer 52 (*)    Anion gap 4 (*)    All other components within normal limits  URINE MICROSCOPIC-ADD ON - Abnormal; Notable for the following:    Squamous Epithelial / LPF MANY (*)    Bacteria, UA MANY (*)  Casts HYALINE CASTS (*)    All other components within normal limits  TROPONIN I - Abnormal; Notable for the following:    Troponin I 0.35 (*)    All other components within normal limits  TROPONIN I - Abnormal; Notable for the following:    Troponin I 0.35 (*)    All other components within normal limits  TROPONIN I - Abnormal; Notable for the following:    Troponin I 0.25 (*)    All other components within normal limits  BASIC METABOLIC PANEL - Abnormal; Notable for the following:    Glucose, Bld 188 (*)    Creatinine, Ser 1.26 (*)    Calcium 8.3 (*)    GFR calc non Af Amer 45 (*)    GFR calc Af Amer 53 (*)    All other components within normal limits  CBC - Abnormal; Notable for the following:    RBC 3.68 (*)    Hemoglobin 11.6 (*)    Platelets 103 (*)    All other components within normal limits  PROTIME-INR - Abnormal; Notable for the following:    Prothrombin Time 19.4 (*)    INR 1.64 (*)    All other components within normal limits  I-STAT TROPOININ, ED - Abnormal; Notable for the following:    Troponin i, poc 0.13 (*)    All other components within normal limits  URINE CULTURE  HEPATIC FUNCTION PANEL  APTT  HEPARIN LEVEL (UNFRACTIONATED)  HEPARIN LEVEL (UNFRACTIONATED)    Imaging Review Dg Chest 2 View  03/27/2015   CLINICAL DATA:  Chest pain and  shortness of breath.  Chronic asthma  EXAM: CHEST  2 VIEW  COMPARISON:  Chest radiograph and chest CT Feb 21, 2014  FINDINGS: Chronic interstitial lung disease is noted diffusely, stable. There is no well-defined edema or consolidation. Heart is borderline enlarged with pulmonary vascularity within normal limits. No adenopathy. No bone lesions.  IMPRESSION: Chronic interstitial lung disease remains unchanged. No frank edema or consolidation. Heart borderline enlarged.   Electronically Signed   By: Lowella Grip III M.D.   On: 03/27/2015 11:45     EKG Interpretation   Date/Time:  Thursday March 27 2015 10:11:37 EDT Ventricular Rate:  82 PR Interval:  151 QRS Duration: 66 QT Interval:  418 QTC Calculation: 488 R Axis:   106 Text Interpretation:  Sinus rhythm Low voltage with right axis deviation  Borderline prolonged QT interval, unchanged rate has decreased since last  tracing Confirmed by Debby Freiberg [phone]) on 03/27/2015 10:32:35 AM      MDM   Final diagnoses:  Abdominal pain    59 y.o. female with pertinent PMH of Hep C, HTN, prior edema presents with facial edema, chest, abd pain, and exertional dyspnea.  Exam consistent with volume overload vs COPD exacerbation, pt with new 2L O2 requirement, elevated BNP.  Admitted in stable condition.    I have reviewed all laboratory and imaging studies if ordered as above  1. Abdominal pain         Debby Freiberg, MD 03/28/15 [phone]

## 2015-03-27 NOTE — ED Notes (Signed)
Dr. Colin Rhein given results

## 2015-03-27 NOTE — ED Notes (Signed)
Pt reports non productive cough x2 weeks, SOB x1.5 weeks, facial swelling x1 week, chest pain x4 days, abd pain x3 days. Pain 8/10 at present. Hx of pancreatitis, asthma, and hep C. Denies ETOH use.

## 2015-03-27 NOTE — Progress Notes (Signed)
ANTICOAGULATION CONSULT NOTE - Initial Consult  Pharmacy Consult for Heparin Indication: chest pain/ACS  Allergies  Allergen Reactions  . Meperidine Hcl Other (See Comments)    hallucinations  . Naproxen Other (See Comments)    Extreme bruising    Patient Measurements: Height: 5' 4" (162.6 cm) Weight: 158 lb 14.4 oz (72.077 kg) IBW/kg (Calculated) : 54.7 Heparin Dosing Weight: 69.5 kg  Vital Signs: Temp: 97.5 F (36.4 C) (06/23 1512) Temp Source: Oral (06/23 1512) BP: 165/69 mmHg (06/23 1512) Pulse Rate: 84 (06/23 1512)  Labs:  Recent Labs  03/27/15 1023 03/27/15 1227 03/27/15 1738  HGB 12.5  --   --   HCT 40.5  --   --   PLT 130*  --   --   CREATININE 1.37* 1.27*  --   TROPONINI  --   --  0.25*    Estimated Creatinine Clearance: 45.9 mL/min (by C-G formula based on Cr of 1.27).   Medical History: Past Medical History  Diagnosis Date  . Eczema   . ASTHMA 06/06/2009  . BACK PAIN 08/06/2010  . Edema 05/20/2010  . GERD 06/06/2009  . HEPATITIS C WITHOUT HEPATIC COMA 06/06/2009  . HYPERTENSION 06/06/2009  . OSTEOPOROSIS 06/06/2009  . 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 TOBACCO USE 06/05/2010  . Unspecified hypothyroidism 06/05/2010  . VITAMIN D DEFICIENCY 06/06/2009  . Chronic pancreatitis   . DEPRESSION 06/06/2009    states she is not depressed    Medications:  Scheduled:  . aspirin EC  325 mg Oral Daily  . [START ON 03/28/2015] atorvastatin  40 mg Oral q1800  . furosemide  20 mg Intravenous Q12H  . gabapentin  300 mg Oral BID  . metoprolol tartrate  25 mg Oral BID  . potassium chloride SA  20 mEq Oral BID   Infusions:   PRN: acetaminophen **OR** acetaminophen, ALPRAZolam, ondansetron **OR** ondansetron (ZOFRAN) IV, oxyCODONE, polyethylene glycol  Assessment: 59 yo female presents with dyspnea, swelling of the face and abdomen and chest pain. Troponin elevated so Pharmacy consulted to dose IV heparin for ACS.  Goal of Therapy:  Heparin  level 0.3-0.7 units/ml Monitor platelets by anticoagulation protocol: Yes   Plan:   Heparin 1800 units IV bolus  Heparin IV continuous infusion 1100 units/hr  Heparin level 8hrs after starting   Daily heparin level and CBC  Peggyann Juba, PharmD, BCPS Pager: [phone] 03/27/2015,7:19 PM

## 2015-03-28 ENCOUNTER — Inpatient Hospital Stay (HOSPITAL_COMMUNITY): Payer: Medicare Other

## 2015-03-28 ENCOUNTER — Encounter (HOSPITAL_COMMUNITY): Payer: Self-pay | Admitting: Cardiology

## 2015-03-28 DIAGNOSIS — R778 Other specified abnormalities of plasma proteins: Secondary | ICD-10-CM | POA: Diagnosis present

## 2015-03-28 DIAGNOSIS — R7989 Other specified abnormal findings of blood chemistry: Secondary | ICD-10-CM

## 2015-03-28 DIAGNOSIS — R0600 Dyspnea, unspecified: Secondary | ICD-10-CM

## 2015-03-28 LAB — CBC
HCT: 36.8 % (ref 36.0–46.0)
Hemoglobin: 11.6 g/dL — ABNORMAL LOW (ref 12.0–15.0)
MCH: 31.5 pg (ref 26.0–34.0)
MCHC: 31.5 g/dL (ref 30.0–36.0)
MCV: 100 fL (ref 78.0–100.0)
Platelets: 103 10*3/uL — ABNORMAL LOW (ref 150–400)
RBC: 3.68 MIL/uL — AB (ref 3.87–5.11)
RDW: 15.3 % (ref 11.5–15.5)
WBC: 7 10*3/uL (ref 4.0–10.5)

## 2015-03-28 LAB — LIPID PANEL
Cholesterol: 63 mg/dL (ref 0–200)
HDL: 25 mg/dL — AB (ref >40)
LDL CALC: 29 mg/dL (ref 0–99)
Total CHOL/HDL Ratio: 2.5 RATIO
Triglycerides: 44 mg/dL (ref <150)
VLDL: 9 mg/dL (ref 0–40)

## 2015-03-28 LAB — BASIC METABOLIC PANEL
ANION GAP: 6 (ref 5–15)
BUN: 19 mg/dL (ref 6–20)
CALCIUM: 8.3 mg/dL — AB (ref 8.9–10.3)
CHLORIDE: 104 mmol/L (ref 101–111)
CO2: 26 mmol/L (ref 22–32)
Creatinine, Ser: 1.26 mg/dL — ABNORMAL HIGH (ref 0.44–1.00)
GFR calc Af Amer: 53 mL/min — ABNORMAL LOW (ref >60)
GFR calc non Af Amer: 45 mL/min — ABNORMAL LOW (ref >60)
GLUCOSE: 188 mg/dL — AB (ref 65–99)
Potassium: 4.2 mmol/L (ref 3.5–5.1)
Sodium: 136 mmol/L (ref 135–145)

## 2015-03-28 LAB — MAGNESIUM: Magnesium: 1.6 mg/dL — ABNORMAL LOW (ref 1.7–2.4)

## 2015-03-28 LAB — URINE CULTURE

## 2015-03-28 LAB — HEPARIN LEVEL (UNFRACTIONATED)
HEPARIN UNFRACTIONATED: 0.48 [IU]/mL (ref 0.30–0.70)
Heparin Unfractionated: 0.36 IU/mL (ref 0.30–0.70)

## 2015-03-28 LAB — TROPONIN I: TROPONIN I: 0.35 ng/mL — AB (ref <0.031)

## 2015-03-28 LAB — TSH: TSH: 2.171 u[IU]/mL (ref 0.350–4.500)

## 2015-03-28 MED ORDER — SALINE SPRAY 0.65 % NA SOLN
1.0000 | NASAL | Status: DC | PRN
  Filled 2015-03-28: qty 44

## 2015-03-28 MED ORDER — IPRATROPIUM-ALBUTEROL 0.5-2.5 (3) MG/3ML IN SOLN
3.0000 mL | Freq: Three times a day (TID) | RESPIRATORY_TRACT | Status: DC
  Administered 2015-03-29 – 2015-03-31 (×8): 3 mL via RESPIRATORY_TRACT
  Filled 2015-03-28 (×10): qty 3

## 2015-03-28 MED ORDER — IPRATROPIUM-ALBUTEROL 0.5-2.5 (3) MG/3ML IN SOLN
3.0000 mL | Freq: Four times a day (QID) | RESPIRATORY_TRACT | Status: DC
  Administered 2015-03-28 (×2): 3 mL via RESPIRATORY_TRACT
  Filled 2015-03-28 (×2): qty 3

## 2015-03-28 NOTE — Progress Notes (Signed)
  Echocardiogram 2D Echocardiogram has been performed.  Darlina Sicilian M 03/28/2015, 2:23 PM

## 2015-03-28 NOTE — Progress Notes (Signed)
Date:  March 28, 2015 U.R. performed for needs and level of care. Will continue to follow for Case Management needs.  Velva Harman, RN, BSN, [place]   [phone]

## 2015-03-28 NOTE — Progress Notes (Signed)
ANTICOAGULATION CONSULT NOTE - Follow Up Consult  Pharmacy Consult for Heparin Indication: chest pain/ACS  Allergies  Allergen Reactions  . Meperidine Hcl Other (See Comments)    hallucinations  . Naproxen Other (See Comments)    Extreme bruising    Patient Measurements: Height: _0  (162.6 cm) Weight: 158 lb 3.2 oz (71.759 kg) IBW/kg (Calculated) : 54.7 Heparin Dosing Weight: 69.5 kg   Vital Signs: Temp: 98.6 F (37 C) (06/24 0515) Temp Source: Oral (06/24 0515) BP: 112/76 mmHg (06/24 0952) Pulse Rate: 70 (06/24 0952)  Labs:  Recent Labs  03/27/15 1023 03/27/15 1227 03/27/15 1738 03/27/15 2018 03/28/15 0230 03/28/15 0234  HGB 12.5  --   --   --  11.6*  --   HCT 40.5  --   --   --  36.8  --   PLT 130*  --   --   --  103*  --   APTT  --   --   --  29  --   --   LABPROT  --   --   --  19.4*  --   --   INR  --   --   --  1.64*  --   --   HEPARINUNFRC  --   --   --   --  0.36  --   CREATININE 1.37* 1.27*  --   --  1.26*  --   TROPONINI  --   --  0.25* 0.35*  --  0.35*    Estimated Creatinine Clearance: 46.1 mL/min (by C-G formula based on Cr of 1.26).   Medications:  Infusions:  . heparin 1,100 Units/hr (03/27/15 2213)    Assessment: 59 yo female presents with dyspnea, swelling of the face and abdomen and chest pain. Troponin elevated so Pharmacy consulted to dose IV heparin for ACS.  PMH CAD s/p stent 2015, COPD, cirrhosis, HLD.   Baseline INR sl elevated, aPTT wnl  Prior anticoagulation: none (ASA)  Significant events:   Today, 03/28/2015:  CBC: Hgb, Plt dropped.  Unlikely dilutional; patient -1.4 L since admit.  Per Cards, known thrombocytopenia since 2014  Most recent heparin level therapeutic on 1800 units/hr  No bleeding or infusion issues per nursing  SCr sl elevated; CrCl 46  Repeat TnI unchanged  Goal of Therapy: Heparin level 0.3-0.7 units/ml Monitor platelets by anticoagulation protocol: Yes  Plan:  Continue heparin 1800  units/hr IV infusion  Can transition to daily heparin level  Daily CBC, HL  Monitor for signs of bleeding or thrombosis; f/u Plt closely   Reuel Boom, PharmD Pager: [phone] 03/28/2015, 10:17 AM

## 2015-03-28 NOTE — Progress Notes (Signed)
ANTICOAGULATION CONSULT NOTE - Follow Up Consult  Pharmacy Consult for Heparin Indication: chest pain/ACS  Allergies  Allergen Reactions  . Meperidine Hcl Other (See Comments)    hallucinations  . Naproxen Other (See Comments)    Extreme bruising    Patient Measurements: Height: _0  (162.6 cm) Weight: 158 lb 14.4 oz (72.077 kg) IBW/kg (Calculated) : 54.7 Heparin Dosing Weight:   Vital Signs: Temp: 98.4 F (36.9 C) (06/23 2052) Temp Source: Oral (06/23 2052) BP: 133/54 mmHg (06/23 2052) Pulse Rate: 86 (06/23 2052)  Labs:  Recent Labs  03/27/15 1023 03/27/15 1227 03/27/15 1738 03/27/15 2018 03/28/15 0230 03/28/15 0234  HGB 12.5  --   --   --  11.6*  --   HCT 40.5  --   --   --  36.8  --   PLT 130*  --   --   --  103*  --   APTT  --   --   --  29  --   --   LABPROT  --   --   --  19.4*  --   --   INR  --   --   --  1.64*  --   --   HEPARINUNFRC  --   --   --   --  0.36  --   CREATININE 1.37* 1.27*  --   --  1.26*  --   TROPONINI  --   --  0.25* 0.35*  --  0.35*    Estimated Creatinine Clearance: 46.2 mL/min (by C-G formula based on Cr of 1.26).   Medications:  Infusions:  . heparin 1,100 Units/hr (03/27/15 2213)    Assessment: Patient with heparin level at goal but level drawn early. No heparin issues noted.  Goal of Therapy:  Heparin level 0.3-0.7 units/ml Monitor platelets by anticoagulation protocol: Yes   Plan:  Continue heparin drip at current rate Recheck level at 0900  Tyler Deis, Shea Stakes Crowford 03/28/2015,4:55 AM

## 2015-03-28 NOTE — Care Management Note (Addendum)
Case Management Note  Patient Details  Name: Phyllis [name] MRN: [id number] Date of Birth: 04-Mar-1956  Subjective/Objective:                  Poss nstemi versus chf + cardiac markers,   Action/Plan: home  Expected Discharge Date:   (UNKNOWN)               Expected Discharge Plan:  Home/Self Care  In-House Referral:  NA  Discharge planning Services  CM Consult  Post Acute Care Choice:  NA Choice offered to:  NA  DME Arranged:    DME Agency:     HH Arranged:    HH Agency:     Status of Service:  In process, will continue to follow  Medicare Important Message Given:    Date Medicare IM Given:    Medicare IM give by:    Date Additional Medicare IM Given:    Additional Medicare Important Message give by:     If discussed at [place] of Stay Meetings, dates discussed:    Additional Comments:  Leeroy Cha, RN 03/28/2015, 9:13 AM

## 2015-03-28 NOTE — Consult Note (Signed)
Reason for Consult: elevated troponin   Referring Physician: Dr. Broadus John   PCP:  [name], MD  Primary Cardiologist: Dr. Horton Marshall is an 59 y.o. female.    Chief Complaint: admitted 03/27/15 for facial swelling, abd swelling and dyspnea   HPI:  59 year old female past medical history significant for pancreatitis, hypertension, depression, GERD, history of hepatitis, degenerative joint disease, history of tobacco abuse, chronic pain syndrome last seen by Dr. Percival Spanish in 2013 for abnormal CXR.  She did have a cardiac cath 03/2014 with Dr. Terrence Dupont with LAD 15-20%, RCA 10-15%.  Normal EF.  She would prefer to re-establish care with Dr,. Audris Speaker.   She reported progressive dyspnea on exertion for 2-3 weeks, over the past week she noticed facial swelling, especially periorbital, and abdominal swelling, distension. This is associated with non productive cough, and over the last 2-3days she started experiencing chest pain which she describes as heaviness/pressure "like someone sitting on my chest"  like on and off.  She could hardly walk in her home due to DOE and pressure, she had to prop herself up at night to sleep and her husband told her her snoring and wheezing worse. Denies any chest pain now. Still with mild abd pain.   In ER, noted to have creatinine of 1.2, BNP 1043, troponin 0.13 and follow up troponins 0.25-->0.35-->0.35.    EKG SR with inf lat ST depression though similar to old EKGS now more pronounced.    Repeat EKG this AM with SR with t wave inversions in III , continued non specific ST depression and deeper t wave inversions in V2 and V3.    Last echo 03/2013: - Left ventricle: The cavity size was normal. Wall thickness was normal. Systolic function was normal. The estimated ejection fraction was in the range of 60% to 65%. Wall motion was normal; there were no regional wall motion abnormalities. Doppler parameters are  consistent with abnormal left ventricular relaxation (grade 1 diastolic dysfunction). - Pulmonary arteries: PA peak pressure: 50m Hg (S). - Pericardium, extracardiac: A trivial pericardial effusion was identified posterior to the heart.  Past Medical History  Diagnosis Date  . Eczema   . ASTHMA 06/06/2009  . BACK PAIN 08/06/2010  . Edema 05/20/2010  . GERD 06/06/2009  . HEPATITIS C WITHOUT HEPATIC COMA 06/06/2009  . HYPERTENSION 06/06/2009  . OSTEOPOROSIS 06/06/2009  . 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 TOBACCO USE 06/05/2010  . Unspecified hypothyroidism 06/05/2010  . VITAMIN D DEFICIENCY 06/06/2009  . Chronic pancreatitis   . DEPRESSION 06/06/2009    states she is not depressed    Past Surgical History  Procedure Laterality Date  . Cholecystectomy    . Breast lumpectomy      benign, right  . Tubal ligation    . Tonsillectomy    . Lumbar laminectomy    . Left heart catheterization with coronary angiogram N/A 03/08/2013    Procedure: LEFT HEART CATHETERIZATION WITH CORONARY ANGIOGRAM;  Surgeon: MClent Demark MD;  [facility];  Service: Cardiovascular;  Laterality: N/A;    Family History  Problem Relation Age of Onset  . COPD Father   . Hypertension Mother   . Kidney disease Mother   . Colon cancer Neg Hx   . Stomach cancer Neg Hx    Social History:  reports that she has been smoking Cigarettes.  She has a 3.5 pack-year smoking history. She has never  used smokeless tobacco. She reports that she does not drink alcohol or use illicit drugs.  Allergies:  Allergies  Allergen Reactions  . Meperidine Hcl Other (See Comments)    hallucinations  . Naproxen Other (See Comments)    Extreme bruising    OUTPATIENT MEDICATIONS: No current facility-administered medications on file prior to encounter.   Current Outpatient Prescriptions on File Prior to Encounter  Medication Sig Dispense Refill  . Albuterol Sulfate (PROAIR RESPICLICK) 160 (90  BASE) MCG/ACT AEPB Inhale 1 puff into the lungs 4 (four) times daily as needed. (Patient taking differently: Inhale 1 puff into the lungs 4 (four) times daily as needed (For shortness of breath.). ) 1 each 11  . ALPRAZolam (XANAX) 0.5 MG tablet Take 1 tablet (0.5 mg total) by mouth 3 (three) times daily as needed for anxiety. 60 tablet 0  . aspirin EC 81 MG tablet Take 81 mg by mouth at bedtime.     . gabapentin (NEURONTIN) 300 MG capsule Take 300 mg by mouth 2 (two) times daily.    . hydrochlorothiazide (MICROZIDE) 12.5 MG capsule Take 1 capsule (12.5 mg total) by mouth daily. 90 capsule 3  . HYDROcodone-acetaminophen (NORCO) 10-325 MG per tablet Take 1 tablet by mouth 5 (five) times daily as needed for moderate pain (Can take 4 to 5 a day prn per pain managment center 02/06/2015).     [name] ibuprofen (ADVIL,MOTRIN) 200 MG tablet Take 200-400 mg by mouth every 6 (six) hours as needed (for pain).     . triamcinolone ointment (KENALOG) 0.1 % Apply 1 application topically 2 (two) times daily. 30 g 2  . potassium chloride SA (KLOR-CON M20) 20 MEQ tablet Take 1 tablet (20 mEq total) by mouth 2 (two) times daily. (Patient not taking: Reported on 03/27/2015) 180 tablet 1    CURRENT medications: Scheduled Meds: . aspirin EC  325 mg Oral Daily  . atorvastatin  40 mg Oral q1800  . furosemide  20 mg Intravenous Q12H  . gabapentin  300 mg Oral BID  . metoprolol tartrate  25 mg Oral BID  . potassium chloride SA  20 mEq Oral BID   Continuous Infusions: . heparin 1,100 Units/hr (03/27/15 2213)   PRN Meds:.acetaminophen **OR** acetaminophen, ALPRAZolam, ondansetron **OR** ondansetron (ZOFRAN) IV, oxyCODONE, polyethylene glycol   Results for orders placed or performed during the hospital encounter of 03/27/15 (from the past 48 hour(s))  Basic metabolic panel     Status: Abnormal   Collection Time: 03/27/15 10:23 AM  Result Value Ref Range   Sodium 136 135 - 145 mmol/L   Potassium 5.2 (H) 3.5 - 5.1 mmol/L     Chloride 103 101 - 111 mmol/L   CO2 24 22 - 32 mmol/L   Glucose, Bld 92 65 - 99 mg/dL   BUN 19 6 - 20 mg/dL   Creatinine, Ser 1.37 (H) 0.44 - 1.00 mg/dL   Calcium 8.9 8.9 - 10.3 mg/dL   GFR calc non Af Amer 41 (L) >60 mL/min   GFR calc Af Amer 48 (L) >60 mL/min    Comment: (NOTE) The eGFR has been calculated using the CKD EPI equation. This calculation has not been validated in all clinical situations. eGFR's persistently <60 mL/min signify possible Chronic Kidney Disease.    Anion gap 9 5 - 15  CBC with Differential     Status: Abnormal   Collection Time: 03/27/15 10:23 AM  Result Value Ref Range   WBC 5.3 4.0 - 10.5 K/uL  RBC 4.02 3.87 - 5.11 MIL/uL   Hemoglobin 12.5 12.0 - 15.0 g/dL   HCT 40.5 36.0 - 46.0 %   MCV 100.7 (H) 78.0 - 100.0 fL   MCH 31.1 26.0 - 34.0 pg   MCHC 30.9 30.0 - 36.0 g/dL   RDW 15.3 11.5 - 15.5 %   Platelets 130 (L) 150 - 400 K/uL   Neutrophils Relative % 58 43 - 77 %   Neutro Abs 3.0 1.7 - 7.7 K/uL   Lymphocytes Relative 30 12 - 46 %   Lymphs Abs 1.6 0.7 - 4.0 K/uL   Monocytes Relative 12 3 - 12 %   Monocytes Absolute 0.7 0.1 - 1.0 K/uL   Eosinophils Relative 0 0 - 5 %   Eosinophils Absolute 0.0 0.0 - 0.7 K/uL   Basophils Relative 0 0 - 1 %   Basophils Absolute 0.0 0.0 - 0.1 K/uL  Brain natriuretic peptide     Status: Abnormal   Collection Time: 03/27/15 10:24 AM  Result Value Ref Range   B Natriuretic Peptide 1043.0 (H) 0.0 - 100.0 pg/mL  Urinalysis, Routine w reflex microscopic (not at [facility])     Status: Abnormal   Collection Time: 03/27/15 12:11 PM  Result Value Ref Range   Color, Urine YELLOW YELLOW   APPearance CLOUDY (A) CLEAR   Specific Gravity, Urine 1.012 1.005 - 1.030   pH 6.0 5.0 - 8.0   Glucose, UA NEGATIVE NEGATIVE mg/dL   Hgb urine dipstick NEGATIVE NEGATIVE   Bilirubin Urine NEGATIVE NEGATIVE   Ketones, ur NEGATIVE NEGATIVE mg/dL   Protein, ur NEGATIVE NEGATIVE mg/dL   Urobilinogen, UA 0.2 0.0 - 1.0 mg/dL   Nitrite  NEGATIVE NEGATIVE   Leukocytes, UA TRACE (A) NEGATIVE  Urine microscopic-add on     Status: Abnormal   Collection Time: 03/27/15 12:11 PM  Result Value Ref Range   Squamous Epithelial / LPF MANY (A) RARE   WBC, UA 3-6 <3 WBC/hpf   RBC / HPF 0-2 <3 RBC/hpf   Bacteria, UA MANY (A) RARE   Casts HYALINE CASTS (A) NEGATIVE  Basic metabolic panel     Status: Abnormal   Collection Time: 03/27/15 12:27 PM  Result Value Ref Range   Sodium 136 135 - 145 mmol/L   Potassium 4.6 3.5 - 5.1 mmol/L   Chloride 107 101 - 111 mmol/L   CO2 25 22 - 32 mmol/L   Glucose, Bld 88 65 - 99 mg/dL   BUN 20 6 - 20 mg/dL   Creatinine, Ser 1.27 (H) 0.44 - 1.00 mg/dL   Calcium 8.9 8.9 - 10.3 mg/dL   GFR calc non Af Amer 45 (L) >60 mL/min   GFR calc Af Amer 52 (L) >60 mL/min    Comment: (NOTE) The eGFR has been calculated using the CKD EPI equation. This calculation has not been validated in all clinical situations. eGFR's persistently <60 mL/min signify possible Chronic Kidney Disease.    Anion gap 4 (L) 5 - 15  Hepatic function panel     Status: None   Collection Time: 03/27/15 12:27 PM  Result Value Ref Range   Total Protein 6.9 6.5 - 8.1 g/dL   Albumin 3.6 3.5 - 5.0 g/dL   AST 39 15 - 41 U/L   ALT 17 14 - 54 U/L   Alkaline Phosphatase 92 38 - 126 U/L   Total Bilirubin 0.9 0.3 - 1.2 mg/dL   Bilirubin, Direct 0.4 0.1 - 0.5 mg/dL   Indirect Bilirubin 0.5  0.3 - 0.9 mg/dL  I-stat troponin, ED     Status: Abnormal   Collection Time: 03/27/15  1:12 PM  Result Value Ref Range   Troponin i, poc 0.13 (HH) 0.00 - 0.08 ng/mL   Comment NOTIFIED PHYSICIAN    Comment 3            Comment: Due to the release kinetics of cTnI, a negative result within the first hours of the onset of symptoms does not rule out myocardial infarction with certainty. If myocardial infarction is still suspected, repeat the test at appropriate intervals.   Troponin I     Status: Abnormal   Collection Time: 03/27/15  5:38 PM    Result Value Ref Range   Troponin I 0.25 (H) <0.031 ng/mL    Comment:        PERSISTENTLY INCREASED TROPONIN VALUES IN THE RANGE OF 0.04-0.49 ng/mL CAN BE SEEN IN:       -UNSTABLE ANGINA       -CONGESTIVE HEART FAILURE       -MYOCARDITIS       -CHEST TRAUMA       -ARRYHTHMIAS       -LATE PRESENTING MYOCARDIAL INFARCTION       -COPD   CLINICAL FOLLOW-UP RECOMMENDED.   Troponin I (q 6hr x 3)     Status: Abnormal   Collection Time: 03/27/15  8:18 PM  Result Value Ref Range   Troponin I 0.35 (H) <0.031 ng/mL    Comment:        PERSISTENTLY INCREASED TROPONIN VALUES IN THE RANGE OF 0.04-0.49 ng/mL CAN BE SEEN IN:       -UNSTABLE ANGINA       -CONGESTIVE HEART FAILURE       -MYOCARDITIS       -CHEST TRAUMA       -ARRYHTHMIAS       -LATE PRESENTING MYOCARDIAL INFARCTION       -COPD   CLINICAL FOLLOW-UP RECOMMENDED.   APTT     Status: None   Collection Time: 03/27/15  8:18 PM  Result Value Ref Range   aPTT 29 24 - 37 seconds  Protime-INR     Status: Abnormal   Collection Time: 03/27/15  8:18 PM  Result Value Ref Range   Prothrombin Time 19.4 (H) 11.6 - 15.2 seconds   INR 1.64 (H) 0.00 - 3.71  Basic metabolic panel     Status: Abnormal   Collection Time: 03/28/15  2:30 AM  Result Value Ref Range   Sodium 136 135 - 145 mmol/L   Potassium 4.2 3.5 - 5.1 mmol/L   Chloride 104 101 - 111 mmol/L   CO2 26 22 - 32 mmol/L   Glucose, Bld 188 (H) 65 - 99 mg/dL   BUN 19 6 - 20 mg/dL   Creatinine, Ser 1.26 (H) 0.44 - 1.00 mg/dL   Calcium 8.3 (L) 8.9 - 10.3 mg/dL   GFR calc non Af Amer 45 (L) >60 mL/min   GFR calc Af Amer 53 (L) >60 mL/min    Comment: (NOTE) The eGFR has been calculated using the CKD EPI equation. This calculation has not been validated in all clinical situations. eGFR's persistently <60 mL/min signify possible Chronic Kidney Disease.    Anion gap 6 5 - 15  CBC     Status: Abnormal   Collection Time: 03/28/15  2:30 AM  Result Value Ref Range   WBC 7.0 4.0 -  10.5 K/uL   RBC 3.68 (L) 3.87 -  5.11 MIL/uL   Hemoglobin 11.6 (L) 12.0 - 15.0 g/dL   HCT 36.8 36.0 - 46.0 %   MCV 100.0 78.0 - 100.0 fL   MCH 31.5 26.0 - 34.0 pg   MCHC 31.5 30.0 - 36.0 g/dL   RDW 15.3 11.5 - 15.5 %   Platelets 103 (L) 150 - 400 K/uL    Comment: REPEATED TO VERIFY SPECIMEN CHECKED FOR CLOTS CONSISTENT WITH PREVIOUS RESULT   Heparin level (unfractionated)     Status: None   Collection Time: 03/28/15  2:30 AM  Result Value Ref Range   Heparin Unfractionated 0.36 0.30 - 0.70 IU/mL    Comment:        IF HEPARIN RESULTS ARE BELOW EXPECTED VALUES, AND PATIENT DOSAGE HAS BEEN CONFIRMED, SUGGEST FOLLOW UP TESTING OF ANTITHROMBIN III LEVELS.   Troponin I (q 6hr x 3)     Status: Abnormal   Collection Time: 03/28/15  2:34 AM  Result Value Ref Range   Troponin I 0.35 (H) <0.031 ng/mL    Comment:        PERSISTENTLY INCREASED TROPONIN VALUES IN THE RANGE OF 0.04-0.49 ng/mL CAN BE SEEN IN:       -UNSTABLE ANGINA       -CONGESTIVE HEART FAILURE       -MYOCARDITIS       -CHEST TRAUMA       -ARRYHTHMIAS       -LATE PRESENTING MYOCARDIAL INFARCTION       -COPD   CLINICAL FOLLOW-UP RECOMMENDED.    Dg Chest 2 View  03/27/2015   CLINICAL DATA:  Chest pain and shortness of breath.  Chronic asthma  EXAM: CHEST  2 VIEW  COMPARISON:  Chest radiograph and chest CT Feb 21, 2014  FINDINGS: Chronic interstitial lung disease is noted diffusely, stable. There is no well-defined edema or consolidation. Heart is borderline enlarged with pulmonary vascularity within normal limits. No adenopathy. No bone lesions.  IMPRESSION: Chronic interstitial lung disease remains unchanged. No frank edema or consolidation. Heart borderline enlarged.   Electronically Signed   By: Lowella Grip III M.D.   On: 03/27/2015 11:45   US Abdomen Complete  03/28/2015   CLINICAL DATA:  One week history abdominal pain. Chronic hepatitis-C  EXAM: ULTRASOUND ABDOMEN COMPLETE  COMPARISON:  July 09, 2014   FINDINGS: Gallbladder: Surgically absent.  Common bile duct: Diameter: 6 mm. No intrahepatic, common hepatic, or common bile duct dilatation.  Liver: No focal lesion identified. Liver has a nodular contour and increased echogenicity diffusely.  IVC: No abnormality visualized.  Pancreas: No appreciable mass or inflammatory focus.  Spleen: Size and appearance within normal limits.  Right Kidney: Length: 11.3 cm. Echogenicity within normal limits. No perinephric fluid or hydronephrosis visualized. There is a cyst arising from the lower pole of the right kidney containing a septation. This cyst measures 2.3 x 1.8 x 1.8 cm and was present on prior study.  Left Kidney: Length: 9.8 cm. Echogenicity within normal limits. No mass, perinephric fluid, or hydronephrosis visualized. Left kidney appears somewhat malrotated.  Abdominal aorta: No aneurysm visualized.  Other findings: No demonstrable ascites.  IMPRESSION: Liver echogenicity is increased with liver contour nodular in appearance. 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 While no focal liver lesions are identified, it must be cautioned that the sensitivity of ultrasound for focal liver lesions is diminished significantly in this circumstance.  Gallbladder is absent.  Stable slightly complex cyst arising from the lower pole right kidney, not significantly changed.  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   Electronically Signed   By: Lowella Grip III M.D.   On: 03/28/2015 09:25    ROS: General:no colds or fevers, + weight increase from May 2016 not weighed yesterday Skin:no rashes + non healing wound on Rt lat calf. Occurred from injury but not healing.   HEENT:no blurred vision, no congestion CV:see HPI PUL:see HPI GI:no diarrhea constipation or melena, no indigestion GU:no hematuria, no dysuria MS:no joint pain, + claudication with ambulation and non healing wound on rt lower ext.  Neuro:no syncope, no lightheadedness Endo:no  diabetes, no thyroid disease   Blood pressure 105/50, pulse 79, temperature 98.6 F (37 C), temperature source Oral, resp. rate 16, height _0  (1.626 m), weight 158 lb 3.2 oz (71.759 kg), last menstrual period 10/05/1999, SpO2 95 %.  Wt Readings from Last 3 Encounters:  03/28/15 158 lb 3.2 oz (71.759 kg)  02/06/15 142 lb 8 oz (64.638 kg)  09/30/14 143 lb (64.864 kg)    PE: General:Pleasant affect, NAD Skin:Warm and dry, brisk capillary refill HEENT:normocephalic, sclera clear, mucus membranes moist Neck:supple, no JVD, no bruits  Heart:S1S2 RRR without murmur, gallup, rub or click Lungs: with occ. Rales but mostly +rhonchi and wheezes NID:POEU, mild tenderness, + BS, do not palpate liver spleen or masses Ext:no lower ext edema, ? pedal pulses, 2+ radial pulses, wound on Rt calf with dressing Neuro:alert and oriented X 3, MAE, follows commands, + facial symmetry Tele:  SR    Assessment/Plan Principal Problem:   CHF (congestive heart failure) Active Problems:   Elevated troponin   TOBACCO USE   Essential hypertension, benign   COPD bronchitis   Hepatitis C   Dyspnea  1.  CHF awaiting echo -done this AM-possible diastolic HF but may be due to systolic failure.  She is neg 1453 since admit on lasix 20 mg IV BID.  Continue to diuresis - wt is up prob 16 lbs from May of this year.    2. Elevated troponin with non specific ST changes on EKG and chest pain.  Proceed with nuc study to eval for ischemia when her wheezing is improved.  On IV heparin.  On BB on statin   3. CAD with cath 2014 with non obstructive disease.    4. COPD with wheezes and rhonchi- per Im  5. Tobacco use she states she has stopped now but continue to reinforce need to stop  6. Hyperlipidemia with LDL 64 T chol 117 HDL 35 in 09/2014   7. Thrombocytopenia present  Since 2014  8. CKD-3 Cr. 1.26   9. hepatic cirrhosis on abd ultrasound  10.  Non healing wound with complaints of claudication, would check  lower ext arterial dopplers either here or as outpt.    [place]  Nurse Practitioner Certified [place] Pager [phone] or after 5pm or weekends call [phone] 03/28/2015, 9:34 AM   History and all data above reviewed.  Patient examined.  I agree with the findings as above.  Atypical pain for the most part.  Evidence of volume overload.  The patient exam reveals COR:RRR  ,  Lungs: Decreased BS  ,  Abd: Positive bowel sounds, no rebound no guarding, Ext Trace edema.  Neck JVD  .  All available labs, radiology testing, previous records reviewed. Agree with documented assessment and plan. Dyspnea:  She does have evidence of volume overload and I agree with current diuresis.  Echo results pending.  She eats too much  salt by her report.  Further med titration awaits echo results.  Currently tolerating low dose beta blocker.  The BP is slightly low.  Elevated Trop:  I suspect this is demand ischemia.  I would think she will get a Lexiscan Myoview (which I would order for Sunday) if she is breathing better, lying flat and not wheezing on rounds tomorrow and her echo EF is OK.  If EF is down she will need a cath on Monday.    Jeneen Rinks Kianna Billet  11:26 AM  03/28/2015

## 2015-03-28 NOTE — Progress Notes (Signed)
[facility] PROGRESS NOTE  RADONNA BRACHER XNT:[id number] DOB: 1955/10/21 DOA: 03/27/2015 PCP: [name], MD   Chief Complaint: swelling of face, abd, dyspnea  HPI: Phyllis [name] is a 59 y.o. female with PMH of Hep C -treated, COPD, tobacco abuse, Chronic pancreatitis and chronic back pain, HTN, presents to the ER with the above complaints. Patient reports progressive dyspnea on exertion for 2-3 weeks, over the past week she noticed facial swelling, especially periorbital, and abdominal swelling, distension. This is associated with non productive cough, and over the last 2-3days she started experiencing chest pain which she describes as heaviness/pressure like on and off. Denies any chest pain now. In ER, noted to have creatinine of 1.2, BNP 1043, troponin 0.13 and [facility] consulted for further management  Assessment/Plan: Dyspnea -suspect diastolic CHF -Improving, continue IV lasix -ECHO pending  -Liver US pending for this am-since h/o Hep C, eval for cirrhosis/ascites -LFTs, albumin normal -monitor I/Os, weights  -Cards consulting this am  Elevated troponin/Chest pain -denies any chest pain at this time, troponin elevated but non ACS pattern -continue ASA, metoprolol, statin, ECHO today -Continue IV Heparin, await cards input regarding ischemia eval   TOBACCO USE/COPD -mild rare end exp wheezes,  -add duonebs QID -counseled   Essential hypertension, benign -hold cozaar, continue metoprolol   H/o Hepatitis C -completed Rx per pt -last AFP was >6 -Liver US to eval echotexture and r/o HCC   AKI -baseline normal at 0.7 -likely due to NSAIDs, ? cardiorenal -hold ARB, monitor with diuresis  Chronic pancreatitis/Chronic pain syndrome -continue Percocet per home regimen  Code Status: Full Code DVT Prophylaxis: lovenox Family Communication: husband at bedside Disposition Plan: inpatient   Consultants:  Cardiology    HPI/Subjective: Breathing  better, after lasix, urinating more, NPO for liver US  Objective: Filed Vitals:   03/28/15 1316  BP: 136/59  Pulse: 72  Temp: 97.8 F (36.6 C)  Resp: 20    Intake/Output Summary (Last 24 hours) at 03/28/15 1522 Last data filed at 03/28/15 0700  Gross per 24 hour  Intake  96.62 ml  Output   1100 ml  Net -1003.38 ml   Filed Weights   03/27/15 1512 03/28/15 0638  Weight: 72.077 kg (158 lb 14.4 oz) 71.759 kg (158 lb 3.2 oz)    Exam:   General: AAOx3, no distress  Cardiovascular: S1S2/RRR  Respiratory: clear, repair, and expiratory wheezes  Abdomen: Soft, slightly distended, mild sounds present, no organomegaly  Musculoskeletal: No edema clubbing or cyanosis, multiple hyperpigmented scars on lower legs   Data Reviewed: Basic Metabolic Panel:  Recent Labs Lab 03/27/15 1023 03/27/15 1227 03/28/15 0230  NA 136 136 136  K 5.2* 4.6 4.2  CL 103 107 104  CO2 _0 GLUCOSE 92 88 188*  BUN _1 CREATININE 1.37* 1.27* 1.26*  CALCIUM 8.9 8.9 8.3*  MG  --   --  1.6*   Liver Function Tests:  Recent Labs Lab 03/27/15 1227  AST 39  ALT 17  ALKPHOS 92  BILITOT 0.9  PROT 6.9  ALBUMIN 3.6   No results for input(s): LIPASE, AMYLASE in the last 168 hours. No results for input(s): AMMONIA in the last 168 hours. CBC:  Recent Labs Lab 03/27/15 1023 03/28/15 0230  WBC 5.3 7.0  NEUTROABS 3.0  --   HGB 12.5 11.6*  HCT 40.5 36.8  MCV 100.7* 100.0  PLT 130* 103*   Cardiac Enzymes:  Recent Labs Lab 03/27/15 1738 03/27/15 2018  03/28/15 0234  TROPONINI 0.25* 0.35* 0.35*   BNP (last 3 results)  Recent Labs  03/27/15 1024  BNP 1043.0*    ProBNP (last 3 results) No results for input(s): PROBNP in the last 8760 hours.  CBG: No results for input(s): GLUCAP in the last 168 hours.  Recent Results (from the past 240 hour(s))  Urine culture     Status: None   Collection Time: 03/27/15 12:11 PM  Result Value Ref Range Status   Specimen  Description URINE, CLEAN CATCH  Final   Special Requests NONE  Final   Culture   Final    MULTIPLE SPECIES PRESENT, SUGGEST RECOLLECTION IF CLINICALLY INDICATED Performed at [facility]    Report Status 03/28/2015 FINAL  Final     Studies: Dg Chest 2 View  03/27/2015   CLINICAL DATA:  Chest pain and shortness of breath.  Chronic asthma  EXAM: CHEST  2 VIEW  COMPARISON:  Chest radiograph and chest CT Feb 21, 2014  FINDINGS: Chronic interstitial lung disease is noted diffusely, stable. There is no well-defined edema or consolidation. Heart is borderline enlarged with pulmonary vascularity within normal limits. No adenopathy. No bone lesions.  IMPRESSION: Chronic interstitial lung disease remains unchanged. No frank edema or consolidation. Heart borderline enlarged.   Electronically Signed   By: Lowella Grip III M.D.   On: 03/27/2015 11:45   Scheduled Meds: . aspirin EC  325 mg Oral Daily  . atorvastatin  40 mg Oral q1800  . furosemide  20 mg Intravenous Q12H  . gabapentin  300 mg Oral BID  . ipratropium-albuterol  3 mL Nebulization Q6H  . metoprolol tartrate  25 mg Oral BID  . potassium chloride SA  20 mEq Oral BID   Continuous Infusions: . heparin 1,100 Units/hr (03/27/15 2213)   Antibiotics Given (last 72 hours)    None      Principal Problem:   CHF (congestive heart failure) Active Problems:   TOBACCO USE   Essential hypertension, benign   COPD bronchitis   Hepatitis C   Dyspnea   Elevated troponin    Time spent: 60mn    Aryaan Persichetti  [facility] Pager [phone] If 7PM-7AM, please contact night-coverage at [email or web address], password [email or web address]6/24/2016, 3:22 PM  LOS: 1 day

## 2015-03-29 ENCOUNTER — Encounter (HOSPITAL_COMMUNITY): Payer: Self-pay | Admitting: Radiology

## 2015-03-29 ENCOUNTER — Inpatient Hospital Stay (HOSPITAL_COMMUNITY): Payer: Medicare Other

## 2015-03-29 DIAGNOSIS — I5031 Acute diastolic (congestive) heart failure: Secondary | ICD-10-CM | POA: Diagnosis present

## 2015-03-29 DIAGNOSIS — R0600 Dyspnea, unspecified: Secondary | ICD-10-CM

## 2015-03-29 DIAGNOSIS — K7469 Other cirrhosis of liver: Secondary | ICD-10-CM

## 2015-03-29 DIAGNOSIS — Z720 Tobacco use: Secondary | ICD-10-CM

## 2015-03-29 DIAGNOSIS — J9601 Acute respiratory failure with hypoxia: Secondary | ICD-10-CM | POA: Diagnosis present

## 2015-03-29 LAB — BASIC METABOLIC PANEL
Anion gap: 7 (ref 5–15)
BUN: 14 mg/dL (ref 6–20)
CO2: 30 mmol/L (ref 22–32)
Calcium: 8.5 mg/dL — ABNORMAL LOW (ref 8.9–10.3)
Chloride: 100 mmol/L — ABNORMAL LOW (ref 101–111)
Creatinine, Ser: 0.97 mg/dL (ref 0.44–1.00)
GFR calc Af Amer: >60 mL/min (ref >60)
GLUCOSE: 195 mg/dL — AB (ref 65–99)
POTASSIUM: 3.9 mmol/L (ref 3.5–5.1)
SODIUM: 137 mmol/L (ref 135–145)

## 2015-03-29 LAB — CBC
HEMATOCRIT: 36.9 % (ref 36.0–46.0)
Hemoglobin: 11.4 g/dL — ABNORMAL LOW (ref 12.0–15.0)
MCH: 31.3 pg (ref 26.0–34.0)
MCHC: 30.9 g/dL (ref 30.0–36.0)
MCV: 101.4 fL — ABNORMAL HIGH (ref 78.0–100.0)
PLATELETS: 82 10*3/uL — AB (ref 150–400)
RBC: 3.64 MIL/uL — AB (ref 3.87–5.11)
RDW: 15.6 % — ABNORMAL HIGH (ref 11.5–15.5)
WBC: 4.8 10*3/uL (ref 4.0–10.5)

## 2015-03-29 LAB — HEPARIN LEVEL (UNFRACTIONATED): Heparin Unfractionated: 0.34 IU/mL (ref 0.30–0.70)

## 2015-03-29 MED ORDER — REGADENOSON 0.4 MG/5ML IV SOLN
0.4000 mg | Freq: Once | INTRAVENOUS | Status: DC
  Filled 2015-03-29: qty 5

## 2015-03-29 NOTE — Progress Notes (Signed)
{  SATURATION QUALIFICATIONS: (This note is used to comply with regulatory documentation for home oxygen)  Patient Saturations on Room Air at Rest =96%  Patient Saturations on Room Air while Ambulating = 73%  Patient Saturations on 3Liters of oxygen while Ambulating = 93%  Please briefly explain why patient needs home oxygen: requires oxygen to maintain SATURATION LEVELS  While mobilizing. Tresa Endo PT [phone]

## 2015-03-29 NOTE — Progress Notes (Signed)
Spoke with Berna Spare at nuclear medicine department at [facility] and arranged stress test for 03/30/15 as ordered by Dr. Rayann Heman.  [facility] transport arranged to have patient at nuclear medicine department at [facility] on 6/26 as requested by [employer].

## 2015-03-29 NOTE — Consult Note (Addendum)
Name: Phyllis [name] MRN: [id number] DOB: 07/23/1956    ADMISSION DATE:  03/27/2015 CONSULTATION DATE:  03/29/15  REFERRING MD :  Ghimire/ [facility]  CHIEF COMPLAINT:  Short of breath on exertion  BRIEF PATIENT DESCRIPTION: 53 yoF smoker describes more awareness of dyspnea on exertion, going to store, this spring after a cold. Never really improved. Then presented for this admit with increased DOE x 2-3 weeks, swelling in face and abdomen, dry cough. Had self limited substernal heaviness, now resolved. Never told COPD diagnosis till now. ECHO  Shows pulmonary hypertension 67 mmhg.  PCCM asked to consult for help with breathing.  SIGNIFICANT EVENTS    STUDIES:  2D ECHO 6/24- PHN 67 mmhg, NL RA, EF 65-70% CT chest 6/25>   HISTORY OF PRESENT ILLNESS: HPI: Phyllis [name] is a 59 y.o. female with PMH of Hep C -treated, COPD, tobacco abuse, Chronic pancreatitis and chronic back pain/ oxycocone, HTN, presents to the ER with the above complaints. Patient reports progressive dyspnea on exertion for 2-3 weeks, over the past week she noticed facial swelling, especially periorbital, and abdominal swelling, distension. This is associated with non productive cough, and over the last 2-3days she started experiencing chest pain which she describes as heaviness/pressure like on and off. Denies any chest pain now. In ER, noted to have creatinine of 1.2, BNP 1043, troponin 0.13 and [facility] consulted for further management Smoked heavily in past, now 1/2 ppweek. Worked in a Automotive engineer little dust. Asthma as a  Child.   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  has past surgical history that includes Cholecystectomy; Breast lumpectomy; Tubal ligation; Tonsillectomy; Lumbar laminectomy; and left heart catheterization with coronary angiogram (N/A, 03/08/2013). Prior to Admission medications   Medication Sig Start Date End Date Taking? Authorizing Provider  Albuterol Sulfate (PROAIR RESPICLICK) 858 (90 BASE) MCG/ACT AEPB Inhale 1 puff into the lungs 4 (four) times daily as needed. Patient taking differently: Inhale 1 puff into the lungs 4 (four) times daily as needed (For shortness of breath.).  09/30/14  Yes [name], MD  ALPRAZolam Duanne Moron) 0.5 MG tablet Take 1 tablet (0.5 mg total) by mouth 3 (three) times daily as needed for anxiety. 02/06/15  Yes [name], MD  aspirin EC 81 MG tablet Take 81 mg by mouth at bedtime.    Yes Historical Provider, MD  gabapentin (NEURONTIN) 300 MG capsule Take 300 mg by mouth 2 (two) times daily.   Yes Historical Provider, MD  hydrochlorothiazide (MICROZIDE) 12.5 MG capsule Take 1 capsule (12.5 mg total) by mouth daily. 09/30/14  Yes [name], MD  HYDROcodone-acetaminophen [facility]) 10-325 MG per tablet Take 1 tablet by mouth 5 (five) times daily as needed for moderate pain (Can take 4 to 5 a day prn per pain managment center 02/06/2015).    Yes Historical Provider, MD  ibuprofen (ADVIL,MOTRIN) 200 MG tablet Take 200-400 mg by mouth every 6 (six) hours as needed (for pain).    Yes Historical Provider, MD  losartan (COZAAR) 100 MG tablet Take 100 mg by mouth daily.   Yes Historical Provider, MD  metoprolol tartrate (LOPRESSOR) 25 MG tablet Take 25 mg by mouth 2 (two) times daily.   Yes  Historical Provider, MD  oxyCODONE (ROXICODONE) 15 MG immediate release tablet Take 15 mg by mouth every 6 (six) hours as needed. For pain. 02/03/15  Yes Historical Provider, MD  triamcinolone ointment (KENALOG) 0.1 % Apply 1 application topically 2 (two) times daily. 05/09/14  Yes [name], MD  potassium chloride SA (KLOR-CON M20) 20 MEQ tablet Take 1 tablet (20 mEq total) by mouth 2 (two) times daily. Patient not taking: Reported on 03/27/2015 05/09/14   [name], MD   Allergies  Allergen Reactions  . Meperidine Hcl Other (See Comments)    hallucinations  . Naproxen Other (See Comments)    Extreme bruising    FAMILY HISTORY:  family history includes COPD in her father; Hypertension in her mother; Kidney disease in her mother. There is no history of Colon cancer or Stomach cancer. SOCIAL HISTORY:  reports that she has been smoking Cigarettes.  She has a 3.5 pack-year smoking history. She has never used smokeless tobacco. She reports that she does not drink alcohol or use illicit drugs.  REVIEW OF SYSTEMS:  += positive Constitutional: Negative for fever, chills, weight loss, malaise/fatigue and diaphoresis.  HENT: Negative for hearing loss, ear pain, nosebleeds, congestion, sore throat, neck pain, tinnitus and ear discharge.   Eyes: Negative for blurred vision, double vision, photophobia, pain, discharge and redness.  Respiratory: Negative for cough, hemoptysis, sputum production, +shortness of breath, wheezing and stridor.   Cardiovascular: Negative for chest pain, palpitations, orthopnea, claudication, leg swelling and PND.  Gastrointestinal: Negative for heartburn, nausea, vomiting, abdominal pain, diarrhea, constipation, blood in stool and melena.  Genitourinary: Negative for dysuria, urgency, frequency, hematuria and flank pain.  Musculoskeletal: Negative for myalgias, back pain, joint pain and falls.  Skin: Negative for itching and rash.  Neurological: Negative for dizziness, tingling, tremors, sensory change, speech change, focal weakness, seizures, loss of consciousness, weakness and headaches.  Endo/Heme/Allergies: Negative for environmental allergies and polydipsia. Does not bruise/bleed easily.  SUBJECTIVE:   VITAL SIGNS: Temp:  [97.8 F (36.6  C)-98.4 F (36.9 C)] 97.8 F (36.6 C) (06/25 0441) Pulse Rate:  [72-88] 72 (06/25 0441) Resp:  [18-20] 18 (06/25 0441) BP: (135-150)/(59-74) 135/74 mmHg (06/25 0441) SpO2:  [86 %-98 %] 98 % (06/25 0739) Weight:  [70.353 kg (155 lb 1.6 oz)] 70.353 kg (155 lb 1.6 oz) (06/25 0441)  PHYSICAL EXAMINATION: General:  Pleasant, calm cooperative, medium build, lying in bed Neuro:  Oriented x 3, movement symmetrical, Pupils equal/ round HEENT:  Conjunctivae clear, mucosa normal, no stridor, gross vision and hearing intact Cardiovascular:  RRR, no m or g heard Lungs:  Distant, trivial crackle, bilateral unlabored wheeze Abdomen:  Soft, nontender, can't feel liver or spleen. Musculoskeletal:  Normal muscle mass Skin:  Clear, no rash   Recent Labs Lab 03/27/15 1227 03/28/15 0230 03/29/15 0530  NA 136 136 137  K 4.6 4.2 3.9  CL 107 104 100*  CO2 _0 BUN _1 CREATININE 1.27* 1.26* 0.97  GLUCOSE 88 188* 195*    Recent Labs Lab 03/27/15 1023 03/28/15 0230 03/29/15 0530  HGB 12.5 11.6* 11.4*  HCT 40.5 36.8 36.9  WBC 5.3 7.0 4.8  PLT 130* 103* 82*   US Abdomen Complete  03/28/2015   CLINICAL DATA:  One week history abdominal pain. Chronic hepatitis-C  EXAM: ULTRASOUND ABDOMEN COMPLETE  COMPARISON:  July 09, 2014  FINDINGS: Gallbladder: Surgically absent.  Common bile duct: Diameter: 6 mm. No intrahepatic, common hepatic, or common bile  duct dilatation.  Liver: No focal lesion identified. Liver has a nodular contour and increased echogenicity diffusely.  IVC: No abnormality visualized.  Pancreas: No appreciable mass or inflammatory focus.  Spleen: Size and appearance within normal limits.  Right Kidney: Length: 11.3 cm. Echogenicity within normal limits. No perinephric fluid or hydronephrosis visualized. There is a cyst arising from the lower pole of the right kidney containing a septation. This cyst measures 2.3 x 1.8 x 1.8 cm and was present on prior study.  Left Kidney:  Length: 9.8 cm. Echogenicity within normal limits. No mass, perinephric fluid, or hydronephrosis visualized. Left kidney appears somewhat malrotated.  Abdominal aorta: No aneurysm visualized.  Other findings: No demonstrable ascites.  IMPRESSION: Liver echogenicity is increased with liver contour nodular in appearance. 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 While no focal liver lesions are identified, it must be cautioned that the sensitivity of ultrasound for focal liver lesions is diminished significantly in this circumstance.  Gallbladder is absent.  Stable slightly complex cyst arising from the lower pole right kidney, not significantly changed.  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   Electronically Signed   By: Lowella Grip III M.D.   On: 03/28/2015 09:25   CT chest images reviewed, pending radiology report. Significant upper zone emphysema. Mild basilar fibrosis or scarring  ASSESSMENT / PLAN: COPD- mixed type, exacerbation. There is wheeze indicating potential benefit from a maintenance inhaler.  Will need PFT and recommend [facility] to [facility] on discharge. 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 Likely old scarring vs DIP  Tobacco abuse- long hx Initial counseling    CD Laporsha Grealish, MD, PCCM Pulmonary and [place] Pager: [phone]  03/29/2015, 12:56 PM

## 2015-03-29 NOTE — Evaluation (Signed)
Physical Therapy Evaluation Patient Details Name: Phyllis [name] MRN: [id number] DOB: May 07, 1956 Today's Date: 03/29/2015   History of Present Illness  32 yoF smoker describes more awareness of dyspnea on exertion, , admitted 03/27/15 with increased SOB, DOE x 2-3 weeks, swelling in face and abdomen, dry cough. Had self limited substernal heaviness, . ECHO Shows pulmonary hypertension .  Clinical Impression  Patient  Ambulating well but with oxygen desaturation  Down to 73% on RA, 85% on 2 l as increased distance. Instruction in pursed lip breathing initiated. Patient will benefit from PT to address problems listed in note below.    Follow Up Recommendations Home health PT (?Pulmonary rehab)    Equipment Recommendations  None recommended by PT    Recommendations for Other Services       Precautions / Restrictions Precautions Precaution Comments: monitor SATS/HR      Mobility  Bed Mobility Overal bed mobility: Independent                Transfers Overall transfer level: Independent                  Ambulation/Gait Ambulation/Gait assistance: Supervision Ambulation Distance (Feet): 200 Feet (x2) Assistive device: None Gait Pattern/deviations: WFL(Within Functional Limits)     General Gait Details: slow for SOB, ambulated on 2 l O2 with sats >87%, then on RA sats dropped to 73 quickly after ambulating x 175', replaced O2 and rested satanding until returned to 90%.  Stairs            Wheelchair Mobility    Modified Rankin (Stroke Patients Only)       Balance Overall balance assessment: Independent                                           Pertinent Vitals/Pain Pain Assessment: 0-10 Pain Score: 8  Pain Location: legs Pain Descriptors / Indicators: Aching Pain Intervention(s): Monitored during session;Premedicated before session    Home Living Family/patient expects to be discharged to:: Private residence Living  Arrangements: Spouse/significant other Available Help at Discharge: Family Type of Home: House Home Access: Stairs to enter Entrance Stairs-Rails: None Technical brewer of Steps: 3 Home Layout: One level Home Equipment: None      Prior Function Level of Independence: Independent               Hand Dominance        Extremity/Trunk Assessment   Upper Extremity Assessment: Overall WFL for tasks assessed           Lower Extremity Assessment: Overall WFL for tasks assessed      Cervical / Trunk Assessment: Normal  Communication   Communication: No difficulties  Cognition Arousal/Alertness: Awake/alert Behavior During Therapy: WFL for tasks assessed/performed Overall Cognitive Status: Within Functional Limits for tasks assessed                      General Comments      Exercises        Assessment/Plan    PT Assessment Patient needs continued PT services  PT Diagnosis Difficulty walking   PT Problem List Decreased activity tolerance;Cardiopulmonary status limiting activity;Pain  PT Treatment Interventions Gait training;Functional mobility training;Patient/family education   PT Goals (Current goals can be found in the Care Plan section) Acute Rehab PT Goals Patient Stated Goal: to walk without SOB PT Goal  Formulation: With patient Time For Goal Achievement: 04/12/15 Potential to Achieve Goals: Good    Frequency Min 3X/week   Barriers to discharge        Co-evaluation               End of Session Equipment Utilized During Treatment: Oxygen Activity Tolerance: Treatment limited secondary to medical complications (Comment) Patient left: in bed;with call bell/phone within reach Nurse Communication: Mobility status (desaturation)         Time: 1661-9694 PT Time Calculation (min) (ACUTE ONLY): 23 min   Charges:   PT Evaluation $Initial PT Evaluation Tier I: 1 Procedure PT Treatments $Gait Training: 8-22 mins   PT G  CodesClaretha Cooper 03/29/2015, 4:03 PM  Tresa Endo PT [phone]

## 2015-03-29 NOTE — Progress Notes (Signed)
PATIENT DETAILS Name: Phyllis [name] Age: 59 y.o. Sex: female Date of Birth: September 11, 1956 Admit Date: 03/27/2015 Admitting Physician Domenic Polite, MD YBF:XOVANV Ronnald Ramp, MD  Subjective: Feels slightly better. Hasn't ambulated to evaluate how she is doing with shortness of breath.  Assessment/Plan: Principal Problem: Acute hypoxic respiratory failure:Likely multifactorial-from worsening severe pulmonary hypertension, underlying COPD/interstitial lung disease, and acute diastolic heart failure. Treat above conditions and attempt titrate off oxygen. May need home O2 on discharge. See below.  Active Problems: Exertional dyspnea: Likely multifactorial-from worsening severe pulmonary hypertension, underlying COPD/interstitial lung disease, and acute diastolic heart failure. Continue with gentle diuresis, see below.  Severe pulmonary hypertension: Suspect secondary to underlying COPD, chest x-ray somewhat suggestive for interstitial lung disease as well. Will check a high resolution CT chest, VQ scan to make sure no chronic PE-although doubt. Have asked pulmonary to evaluate and provide further recommendations.  Acute diastolic heart failure: Continue with gentle diuresis, keep an negative balance. Follow electrolytes.  Suspected COPD: Given her long history of smoking, suspect underlying COPD. Never has had a formal diagnosis as never seen a pulmonologist in the past for PFTs. Continue DuoNeb. Lungs are clear today. Have counseled regarding stopping further tobacco abuse. Needs outpatient PFTs and further workup.  Chest pain: Likely atypical, however troponins elevated in a non-ACS pattern. Echocardiogram shows preserved ejection fraction, no wall motion abnormality was seen. Cardiology recommending a inpatient nuclear stress test.   TOBACCO USE: Counseled extensively.  Essential hypertension, benign: Controlled-metoprolol and Cozaar remain on hold-we will resume when able.  May benefit from calcium channel blockers given severe pulmonary hypertension  Hepatitis C: Claims she was treated recently and was told that she was cured. Although normal ultrasound shows possible cirrhosis-no no ascites or significant volume overload evident on exam. No other stigmata of liver cirrhosis seen on exam. Deferred to the outpatient setting  Mild thrombocytopenia:? Secondary to cirrhosis. Follow.  Chronic pancreatitis: Stable. Tolerating diet.  Chronic pain syndrome: Continue home regimen of oxycodone  Disposition: Remain inpatient  Antimicrobial agents  See below  Anti-infectives    None      DVT Prophylaxis:  SCD's for now-watch platelets  Code Status: Full code   Family Communication Spouse at bedside  Procedures: None  CONSULTS:  cardiology and pulmonary/intensive care  Time spent 30 minutes-Greater than 50% of this time was spent in counseling, explanation of diagnosis, planning of further management, and coordination of care.  MEDICATIONS: Scheduled Meds: . aspirin EC  325 mg Oral Daily  . atorvastatin  40 mg Oral q1800  . furosemide  20 mg Intravenous Q12H  . gabapentin  300 mg Oral BID  . ipratropium-albuterol  3 mL Nebulization TID  . potassium chloride SA  20 mEq Oral BID  . [START ON 03/30/2015] regadenoson  0.4 mg Intravenous Once   Continuous Infusions:  PRN Meds:.acetaminophen **OR** acetaminophen, ALPRAZolam, ondansetron **OR** ondansetron (ZOFRAN) IV, oxyCODONE, polyethylene glycol, sodium chloride    PHYSICAL EXAM: Vital signs in last 24 hours: Filed Vitals:   03/28/15 1947 03/28/15 2058 03/29/15 0441 03/29/15 0739  BP:  150/59 135/74   Pulse:  88 72   Temp:  98.4 F (36.9 C) 97.8 F (36.6 C)   TempSrc:  Oral Oral   Resp:  18 18   Height:      Weight:   70.353 kg (155 lb 1.6 oz)   SpO2: 86% 96% 97% 98%    Weight  change: -1.724 kg (-3 lb 12.8 oz) Filed Weights   03/27/15 1512 03/28/15 0638 03/29/15 0441  Weight:  72.077 kg (158 lb 14.4 oz) 71.759 kg (158 lb 3.2 oz) 70.353 kg (155 lb 1.6 oz)   Body mass index is 26.61 kg/(m^2).   Gen Exam: Awake and alert with clear speech.  Neck: Supple, No JVD.   Chest: B/L Clear.   CVS: S1 S2 Regular, no murmurs.  Abdomen: soft, BS +, non tender, non distended.  Extremities: no edema, lower extremities warm to touch. Neurologic: Non Focal.   Skin: No Rash.   Wounds: N/A.   Intake/Output from previous day:  Intake/Output Summary (Last 24 hours) at 03/29/15 0855 Last data filed at 03/29/15 0700  Gross per 24 hour  Intake    264 ml  Output   2300 ml  Net  -2036 ml     LAB RESULTS: CBC  Recent Labs Lab 03/27/15 1023 03/28/15 0230 03/29/15 0530  WBC 5.3 7.0 4.8  HGB 12.5 11.6* 11.4*  HCT 40.5 36.8 36.9  PLT 130* 103* 82*  MCV 100.7* 100.0 101.4*  MCH 31.1 31.5 31.3  MCHC 30.9 31.5 30.9  RDW 15.3 15.3 15.6*  LYMPHSABS 1.6  --   --   MONOABS 0.7  --   --   EOSABS 0.0  --   --   BASOSABS 0.0  --   --     Chemistries   Recent Labs Lab 03/27/15 1023 03/27/15 1227 03/28/15 0230 03/29/15 0530  NA 136 136 136 137  K 5.2* 4.6 4.2 3.9  CL 103 107 104 100*  CO2 _0 GLUCOSE 92 88 188* 195*  BUN _1 CREATININE 1.37* 1.27* 1.26* 0.97  CALCIUM 8.9 8.9 8.3* 8.5*  MG  --   --  1.6*  --     CBG: No results for input(s): GLUCAP in the last 168 hours.  GFR Estimated Creatinine Clearance: 59.4 mL/min (by C-G formula based on Cr of 0.97).  Coagulation profile  Recent Labs Lab 03/27/15 2018  INR 1.64*    Cardiac Enzymes  Recent Labs Lab 03/27/15 1738 03/27/15 2018 03/28/15 0234  TROPONINI 0.25* 0.35* 0.35*    Invalid input(s): POCBNP No results for input(s): DDIMER in the last 72 hours. No results for input(s): HGBA1C in the last 72 hours.  Recent Labs  03/28/15 0230  CHOL 63  HDL 25*  LDLCALC 29  TRIG 44  CHOLHDL 2.5    Recent Labs  03/28/15 1425  TSH 2.171   No results for input(s):  VITAMINB12, FOLATE, FERRITIN, TIBC, IRON, RETICCTPCT in the last 72 hours. No results for input(s): LIPASE, AMYLASE in the last 72 hours.  Urine Studies No results for input(s): UHGB, CRYS in the last 72 hours.  Invalid input(s): UACOL, UAPR, USPG, UPH, UTP, UGL, UKET, UBIL, UNIT, UROB, ULEU, UEPI, UWBC, URBC, UBAC, CAST, UCOM, BILUA  MICROBIOLOGY: Recent Results (from the past 240 hour(s))  Urine culture     Status: None   Collection Time: 03/27/15 12:11 PM  Result Value Ref Range Status   Specimen Description URINE, CLEAN CATCH  Final   Special Requests NONE  Final   Culture   Final    MULTIPLE SPECIES PRESENT, SUGGEST RECOLLECTION IF CLINICALLY INDICATED Performed at [facility]    Report Status 03/28/2015 FINAL  Final    RADIOLOGY STUDIES/RESULTS: Dg Chest 2 View  03/27/2015   CLINICAL DATA:  Chest pain and shortness of  breath.  Chronic asthma  EXAM: CHEST  2 VIEW  COMPARISON:  Chest radiograph and chest CT Feb 21, 2014  FINDINGS: Chronic interstitial lung disease is noted diffusely, stable. There is no well-defined edema or consolidation. Heart is borderline enlarged with pulmonary vascularity within normal limits. No adenopathy. No bone lesions.  IMPRESSION: Chronic interstitial lung disease remains unchanged. No frank edema or consolidation. Heart borderline enlarged.   Electronically Signed   By: Lowella Grip III M.D.   On: 03/27/2015 11:45   US Abdomen Complete  03/28/2015   CLINICAL DATA:  One week history abdominal pain. Chronic hepatitis-C  EXAM: ULTRASOUND ABDOMEN COMPLETE  COMPARISON:  July 09, 2014  FINDINGS: Gallbladder: Surgically absent.  Common bile duct: Diameter: 6 mm. No intrahepatic, common hepatic, or common bile duct dilatation.  Liver: No focal lesion identified. Liver has a nodular contour and increased echogenicity diffusely.  IVC: No abnormality visualized.  Pancreas: No appreciable mass or inflammatory focus.  Spleen: Size and appearance within  normal limits.  Right Kidney: Length: 11.3 cm. Echogenicity within normal limits. No perinephric fluid or hydronephrosis visualized. There is a cyst arising from the lower pole of the right kidney containing a septation. This cyst measures 2.3 x 1.8 x 1.8 cm and was present on prior study.  Left Kidney: Length: 9.8 cm. Echogenicity within normal limits. No mass, perinephric fluid, or hydronephrosis visualized. Left kidney appears somewhat malrotated.  Abdominal aorta: No aneurysm visualized.  Other findings: No demonstrable ascites.  IMPRESSION: Liver echogenicity is increased with liver contour nodular in appearance. 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 While no focal liver lesions are identified, it must be cautioned that the sensitivity of ultrasound for focal liver lesions is diminished significantly in this circumstance.  Gallbladder is absent.  Stable slightly complex cyst arising from the lower pole right kidney, not significantly changed.  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   Electronically Signed   By: Lowella Grip III M.D.   On: 03/28/2015 09:25    Oren Binet, MD  [facility] Pager:[phone]  If 7PM-7AM, please contact night-coverage [email or web address] Password [facility] 03/29/2015, 8:55 AM   LOS: 2 days

## 2015-03-29 NOTE — Progress Notes (Addendum)
SUBJECTIVE: The patient is doing well today.  Breathing is a little better.  At this time, she denies chest pain or any new concerns.  [name] aspirin EC  325 mg Oral Daily  . atorvastatin  40 mg Oral q1800  . furosemide  20 mg Intravenous Q12H  . gabapentin  300 mg Oral BID  . ipratropium-albuterol  3 mL Nebulization TID  . metoprolol tartrate  25 mg Oral BID  . potassium chloride SA  20 mEq Oral BID   . heparin 1,100 Units/hr (03/28/15 1847)    OBJECTIVE: Physical Exam: Filed Vitals:   03/28/15 1947 03/28/15 2058 03/29/15 0441 03/29/15 0739  BP:  150/59 135/74   Pulse:  88 72   Temp:  98.4 F (36.9 C) 97.8 F (36.6 C)   TempSrc:  Oral Oral   Resp:  18 18   Height:      Weight:   70.353 kg (155 lb 1.6 oz)   SpO2: 86% 96% 97% 98%    Intake/Output Summary (Last 24 hours) at 03/29/15 0817 Last data filed at 03/29/15 0700  Gross per 24 hour  Intake    264 ml  Output   2300 ml  Net  -2036 ml  GEN- The patient is chronically ill appearing, alert and oriented x 3 today.   Head- normocephalic, atraumatic Eyes-  Sclera clear, conjunctiva pink Ears- hearing intact Oropharynx- clear Neck- supple,   Lungs- diffuse expiratory wheezing Heart- Regular rate and rhythm  GI- + distended Extremities- no clubbing, cyanosis, + dependant edema Skin- no rash or lesion Psych- euthymic mood, full affect Neuro- strength and sensation are intact  LABS: Basic Metabolic Panel:  Recent Labs  03/28/15 0230 03/29/15 0530  NA 136 137  K 4.2 3.9  CL 104 100*  CO2 26 30  GLUCOSE 188* 195*  BUN 19 14  CREATININE 1.26* 0.97  CALCIUM 8.3* 8.5*  MG 1.6*  --    Liver Function Tests:  Recent Labs  03/27/15 1227  AST 39  ALT 17  ALKPHOS 92  BILITOT 0.9  PROT 6.9  ALBUMIN 3.6   No results for input(s): LIPASE, AMYLASE in the last 72 hours. CBC:  Recent Labs  03/27/15 1023 03/28/15 0230 03/29/15 0530  WBC 5.3 7.0 4.8  NEUTROABS 3.0  --   --   HGB 12.5 11.6* 11.4*  HCT  40.5 36.8 36.9  MCV 100.7* 100.0 101.4*  PLT 130* 103* 82*   Cardiac Enzymes:  Recent Labs  03/27/15 1738 03/27/15 2018 03/28/15 0234  TROPONINI 0.25* 0.35* 0.35*   BNP: Invalid input(s): POCBNP D-Dimer: No results for input(s): DDIMER in the last 72 hours. Hemoglobin A1C: No results for input(s): HGBA1C in the last 72 hours. Fasting Lipid Panel:  Recent Labs  03/28/15 0230  CHOL 63  HDL 25*  LDLCALC 29  TRIG 44  CHOLHDL 2.5   Thyroid Function Tests:  Recent Labs  03/28/15 1425  TSH 2.171   Anemia Panel: No results for input(s): VITAMINB12, FOLATE, FERRITIN, TIBC, IRON, RETICCTPCT in the last 72 hours.  RADIOLOGY: Dg Chest 2 View  03/27/2015   CLINICAL DATA:  Chest pain and shortness of breath.  Chronic asthma  EXAM: CHEST  2 VIEW  COMPARISON:  Chest radiograph and chest CT Feb 21, 2014  FINDINGS: Chronic interstitial lung disease is noted diffusely, stable. There is no well-defined edema or consolidation. Heart is borderline enlarged with pulmonary vascularity within normal limits. No adenopathy. No bone lesions.  IMPRESSION: Chronic interstitial  lung disease remains unchanged. No frank edema or consolidation. Heart borderline enlarged.   Electronically Signed   By: Lowella Grip III M.D.   On: 03/27/2015 11:45   US Abdomen Complete  03/28/2015   CLINICAL DATA:  One week history abdominal pain. Chronic hepatitis-C  EXAM: ULTRASOUND ABDOMEN COMPLETE  COMPARISON:  July 09, 2014  FINDINGS: Gallbladder: Surgically absent.  Common bile duct: Diameter: 6 mm. No intrahepatic, common hepatic, or common bile duct dilatation.  Liver: No focal lesion identified. Liver has a nodular contour and increased echogenicity diffusely.  IVC: No abnormality visualized.  Pancreas: No appreciable mass or inflammatory focus.  Spleen: Size and appearance within normal limits.  Right Kidney: Length: 11.3 cm. Echogenicity within normal limits. No perinephric fluid or hydronephrosis  visualized. There is a cyst arising from the lower pole of the right kidney containing a septation. This cyst measures 2.3 x 1.8 x 1.8 cm and was present on prior study.  Left Kidney: Length: 9.8 cm. Echogenicity within normal limits. No mass, perinephric fluid, or hydronephrosis visualized. Left kidney appears somewhat malrotated.  Abdominal aorta: No aneurysm visualized.  Other findings: No demonstrable ascites.  IMPRESSION: Liver echogenicity is increased with liver contour nodular in appearance. 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 While no focal liver lesions are identified, it must be cautioned that the sensitivity of ultrasound for focal liver lesions is diminished significantly in this circumstance.  Gallbladder is absent.  Stable slightly complex cyst arising from the lower pole right kidney, not significantly changed.  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   Electronically Signed   By: Lowella Grip III M.D.   On: 03/28/2015 09:25    ASSESSMENT AND PLAN:  Principal Problem:   CHF (congestive heart failure) Active Problems:   TOBACCO USE   Essential hypertension, benign   COPD bronchitis   Hepatitis C   Dyspnea   Elevated troponin   1.pulmonary hypertension Echo reveals preserved EF with elevated pulmonary pressures (severe) Severe by echo and much worse when compared to 2014.  Fortunately, RV function is only mildly reduced Likely secondary and not primary.  She has significant chronic lung disease.  She also has cirrhosis--> could consider portopulmonary hypertersion. Gentle diuresis Would therefore direct therapy towards pulmonary hypertension as a primary effort  2. Elevated troponin This does not represent MI Could pursue myoview (tentatively scheduled for tomorrow by Dr Percival Spanish) though  cath 2014 with non obstructive disease.  I will stop heparin gtt  3. COPD with wheezes and rhonchi and pulmonary hypertension- per Im Consider stopping beta  blocker  4. Tobacco use she states she has stopped now but continue to reinforce need to stop    Thompson Grayer, MD 03/29/2015 8:17 AM

## 2015-03-29 NOTE — Progress Notes (Signed)
Patient had 10 runs of Vtach,   03/29/15 2033 03/29/15 2100  Vitals  Temp --  97.7 F (36.5 C)  Temp Source --  Oral  BP (!) 168/64 mmHg --   BP Location Right Arm --   BP Method Automatic --   Patient Position (if appropriate) Lying --   Pulse Rate 94 --   Pulse Rate Source Dinamap --   Resp 20 --   Oxygen Therapy  SpO2 96 % --   O2 Device Nasal Cannula --   O2 Flow Rate (L/min) 2 L/min --    asymptomatic,denies CP/SOB however felt heart racing for a few seconds. On call  Paged and made aware, no new order received.

## 2015-03-30 ENCOUNTER — Inpatient Hospital Stay (HOSPITAL_COMMUNITY): Payer: Medicare Other

## 2015-03-30 ENCOUNTER — Ambulatory Visit (HOSPITAL_COMMUNITY): Payer: Medicare Other

## 2015-03-30 DIAGNOSIS — I10 Essential (primary) hypertension: Secondary | ICD-10-CM

## 2015-03-30 DIAGNOSIS — I5033 Acute on chronic diastolic (congestive) heart failure: Secondary | ICD-10-CM

## 2015-03-30 DIAGNOSIS — J449 Chronic obstructive pulmonary disease, unspecified: Secondary | ICD-10-CM

## 2015-03-30 DIAGNOSIS — I270 Primary pulmonary hypertension: Secondary | ICD-10-CM

## 2015-03-30 DIAGNOSIS — R079 Chest pain, unspecified: Secondary | ICD-10-CM

## 2015-03-30 LAB — BASIC METABOLIC PANEL
ANION GAP: 9 (ref 5–15)
BUN: 7 mg/dL (ref 6–20)
CO2: 33 mmol/L — AB (ref 22–32)
CREATININE: 0.64 mg/dL (ref 0.44–1.00)
Calcium: 9 mg/dL (ref 8.9–10.3)
Chloride: 97 mmol/L — ABNORMAL LOW (ref 101–111)
GFR calc Af Amer: >60 mL/min (ref >60)
GFR calc non Af Amer: >60 mL/min (ref >60)
GLUCOSE: 106 mg/dL — AB (ref 65–99)
POTASSIUM: 3.7 mmol/L (ref 3.5–5.1)
SODIUM: 139 mmol/L (ref 135–145)

## 2015-03-30 LAB — MAGNESIUM: MAGNESIUM: 1.2 mg/dL — AB (ref 1.7–2.4)

## 2015-03-30 MED ORDER — TECHNETIUM TC 99M SESTAMIBI - CARDIOLITE
30.0000 | Freq: Once | INTRAVENOUS | Status: AC | PRN
  Administered 2015-03-30: 11:00:00 30 via INTRAVENOUS

## 2015-03-30 MED ORDER — REGADENOSON 0.4 MG/5ML IV SOLN
0.4000 mg | Freq: Once | INTRAVENOUS | Status: AC
  Administered 2015-03-30: 0.4 mg via INTRAVENOUS
  Filled 2015-03-30: qty 5

## 2015-03-30 MED ORDER — TECHNETIUM TC 99M SESTAMIBI GENERIC - CARDIOLITE
10.0000 | Freq: Once | INTRAVENOUS | Status: AC | PRN
  Administered 2015-03-30: 10 via INTRAVENOUS

## 2015-03-30 MED ORDER — PREDNISONE 20 MG PO TABS
40.0000 mg | ORAL_TABLET | Freq: Every day | ORAL | Status: DC
  Administered 2015-03-30 – 2015-04-01 (×3): 40 mg via ORAL
  Filled 2015-03-30 (×3): qty 2

## 2015-03-30 MED ORDER — HYDRALAZINE HCL 20 MG/ML IJ SOLN
5.0000 mg | Freq: Once | INTRAMUSCULAR | Status: AC
  Administered 2015-03-30: 5 mg via INTRAVENOUS
  Filled 2015-03-30: qty 1

## 2015-03-30 NOTE — Progress Notes (Signed)
Nuc study results: _____________   FINDINGS: Raw images: There is mild patient motion artifact involve all stress images. There is mild breast attenuation seen on both the provided rest and stress images.  Perfusion: There is a small area of decreased perfusion involving the apical aspect of the anterior wall of the left ventricle. No scintigraphic evidence of prior infarction  Wall Motion: Normal left ventricular wall motion. No left ventricular dilation.  Left Ventricular Ejection Fraction: 74 % _____________   Discussed with Dr. Rayann Heman.  Plan conservative mgmt - med Rx.  Murray Hodgkins, NP 03/30/2015, 4:48 PM

## 2015-03-30 NOTE — Care Management (Signed)
Medicare Important Message given? YES  Date Medicare IM given: 03/30/2015 Medicare IM given by: Venita Sheffield RN CCM

## 2015-03-30 NOTE — Progress Notes (Signed)
Patient is NPO for stress test this morning at [facility] at approx 0830. Medicated times 2 for pain. Uses BSC independently. Periorbital swelling resolving.

## 2015-03-30 NOTE — Progress Notes (Signed)
   Demecia A Glosser presented for a lexiscan cardiolite today.  No immediate complications.  Stress imaging pending.  Murray Hodgkins, NP 03/30/2015, 10:30 AM

## 2015-03-30 NOTE — Progress Notes (Signed)
Spoke with nuclear tech on the [facility] this morning who stated patient has an order for Pulmonary Perf and Vent scheduled. Patient is going for a stress test today  and as a result patient cannot have the above test Pulmonary perf and Ven done until Tuesday. Oncoming nurse made aware.

## 2015-03-30 NOTE — Progress Notes (Signed)
PATIENT DETAILS Name: Phyllis [name] Age: 59 y.o. Sex: female Date of Birth: July 13, 1956 Admit Date: 03/27/2015 Admitting Physician Domenic Polite, MD [id number] Ronnald Ramp, MD  Subjective: SOB continues with ambulation-but feels that she can walk a "bit" more  Assessment/Plan: Principal Problem: Acute hypoxic respiratory failure:Likely multifactorial-from worsening severe pulmonary hypertension, underlying COPD/interstitial lung disease, and acute diastolic heart failure. Treat above conditions and attempt titrate off oxygen. May need home O2 on discharge. See below.  Active Problems: Exertional dyspnea: Likely multifactorial-from worsening severe pulmonary hypertension, underlying COPD/interstitial lung disease, and acute diastolic heart failure. Continue with gentle diuresis, see below.  Severe pulmonary hypertension: Suspect secondary to underlying COPD, chest x-ray somewhat suggestive for interstitial lung disease as well. Await high resolution CT chest report , VQ scan pending to make sure no chronic PE-although doubt.Appreciate pulmonary recommendations.  Acute diastolic heart failure: Continue with gentle diuresis, keep an negative balance. Follow electrolytes.  Mild COPD exacerbation: Given her long history of smoking, suspect underlying COPD. Never has had a formal diagnosis as never seen a pulmonologist in the past for PFTs. Continue DuoNeb-add steroids as wheezing.  Have counseled regarding stopping further tobacco abuse. Needs outpatient PFTs and further workup.  Chest pain: Likely atypical, however troponins elevated in a non-ACS pattern. Echocardiogram shows preserved ejection fraction, no wall motion abnormality was seen. Underwent nuclear stress test-which is positive for ischemia-await cards input  TOBACCO USE: Counseled extensively.  Essential hypertension, benign: Controlled-metoprolol and Cozaar remain on hold-we will resume when able. May benefit from  calcium channel blockers given severe pulmonary hypertension  Hepatitis C: Claims she was treated recently and was told that she was cured. Although normal ultrasound shows possible cirrhosis-no no ascites or significant volume overload evident on exam. No other stigmata of liver cirrhosis seen on exam. Deferred to the outpatient setting  Mild thrombocytopenia:? Secondary to cirrhosis. Follow.  Chronic pancreatitis: Stable. Tolerating diet.  Chronic pain syndrome: Continue home regimen of oxycodone  Disposition: Remain inpatient-home in 2-3 days  Antimicrobial agents  See below  Anti-infectives    None      DVT Prophylaxis:  SCD's for now-watch platelets  Code Status: Full code   Family Communication None at bedside  Procedures: None  CONSULTS:  cardiology and pulmonary/intensive care  Time spent 25 minutes-Greater than 50% of this time was spent in counseling, explanation of diagnosis, planning of further management, and coordination of care.  MEDICATIONS: Scheduled Meds: . aspirin EC  325 mg Oral Daily  . atorvastatin  40 mg Oral q1800  . furosemide  20 mg Intravenous Q12H  . gabapentin  300 mg Oral BID  . ipratropium-albuterol  3 mL Nebulization TID  . potassium chloride SA  20 mEq Oral BID  . regadenoson  0.4 mg Intravenous Once   Continuous Infusions:  PRN Meds:.acetaminophen **OR** acetaminophen, ALPRAZolam, ondansetron **OR** ondansetron (ZOFRAN) IV, oxyCODONE, polyethylene glycol, sodium chloride    PHYSICAL EXAM: Vital signs in last 24 hours: Filed Vitals:   03/30/15 0954 03/30/15 1036 03/30/15 1038 03/30/15 1040  BP: 192/93 187/86 184/85 183/88  Pulse: 93 113 112 108  Temp:      TempSrc:      Resp: 18     Height:      Weight:      SpO2:        Weight change: -2.359 kg (-5 lb 3.2 oz) Filed Weights   03/28/15 8138 03/29/15 0441 03/30/15 0535  Weight: 71.759 kg (158 lb 3.2 oz) 70.353 kg (155 lb 1.6 oz) 67.994 kg (149 lb 14.4 oz)   Body  mass index is 25.72 kg/(m^2).   Gen Exam: Awake and alert with clear speech.  Neck: Supple, No JVD.   Chest: +rhonchi CVS: S1 S2 Regular, no murmurs.  Abdomen: soft, BS +, non tender, non distended.  Extremities: no edema, lower extremities warm to touch. Neurologic: Non Focal.   Skin: No Rash.   Wounds: N/A.   Intake/Output from previous day: No intake or output data in the 24 hours ending 03/30/15 1343   LAB RESULTS: CBC  Recent Labs Lab 03/27/15 1023 03/28/15 0230 03/29/15 0530  WBC 5.3 7.0 4.8  HGB 12.5 11.6* 11.4*  HCT 40.5 36.8 36.9  PLT 130* 103* 82*  MCV 100.7* 100.0 101.4*  MCH 31.1 31.5 31.3  MCHC 30.9 31.5 30.9  RDW 15.3 15.3 15.6*  LYMPHSABS 1.6  --   --   MONOABS 0.7  --   --   EOSABS 0.0  --   --   BASOSABS 0.0  --   --     Chemistries   Recent Labs Lab 03/27/15 1023 03/27/15 1227 03/28/15 0230 03/29/15 0530 03/30/15 0436  NA 136 136 136 137 139  K 5.2* 4.6 4.2 3.9 3.7  CL 103 107 104 100* 97*  CO2 _0 33*  GLUCOSE 92 88 188* 195* 106*  BUN _1 CREATININE 1.37* 1.27* 1.26* 0.97 0.64  CALCIUM 8.9 8.9 8.3* 8.5* 9.0  MG  --   --  1.6*  --  1.2*    CBG: No results for input(s): GLUCAP in the last 168 hours.  GFR Estimated Creatinine Clearance: 70.8 mL/min (by C-G formula based on Cr of 0.64).  Coagulation profile  Recent Labs Lab 03/27/15 2018  INR 1.64*    Cardiac Enzymes  Recent Labs Lab 03/27/15 1738 03/27/15 2018 03/28/15 0234  TROPONINI 0.25* 0.35* 0.35*    Invalid input(s): POCBNP No results for input(s): DDIMER in the last 72 hours. No results for input(s): HGBA1C in the last 72 hours.  Recent Labs  03/28/15 0230  CHOL 63  HDL 25*  LDLCALC 29  TRIG 44  CHOLHDL 2.5    Recent Labs  03/28/15 1425  TSH 2.171   No results for input(s): VITAMINB12, FOLATE, FERRITIN, TIBC, IRON, RETICCTPCT in the last 72 hours. No results for input(s): LIPASE, AMYLASE in the last 72 hours.  Urine  Studies No results for input(s): UHGB, CRYS in the last 72 hours.  Invalid input(s): UACOL, UAPR, USPG, UPH, UTP, UGL, UKET, UBIL, UNIT, UROB, ULEU, UEPI, UWBC, URBC, UBAC, CAST, UCOM, BILUA  MICROBIOLOGY: Recent Results (from the past 240 hour(s))  Urine culture     Status: None   Collection Time: 03/27/15 12:11 PM  Result Value Ref Range Status   Specimen Description URINE, CLEAN CATCH  Final   Special Requests NONE  Final   Culture   Final    MULTIPLE SPECIES PRESENT, SUGGEST RECOLLECTION IF CLINICALLY INDICATED Performed at [facility]    Report Status 03/28/2015 FINAL  Final    RADIOLOGY STUDIES/RESULTS: Dg Chest 2 View  03/27/2015   CLINICAL DATA:  Chest pain and shortness of breath.  Chronic asthma  EXAM: CHEST  2 VIEW  COMPARISON:  Chest radiograph and chest CT Feb 21, 2014  FINDINGS: Chronic interstitial lung disease is noted diffusely, stable. There is no well-defined edema or consolidation.  Heart is borderline enlarged with pulmonary vascularity within normal limits. No adenopathy. No bone lesions.  IMPRESSION: Chronic interstitial lung disease remains unchanged. No frank edema or consolidation. Heart borderline enlarged.   Electronically Signed   By: Lowella Grip III M.D.   On: 03/27/2015 11:45   US Abdomen Complete  03/28/2015   CLINICAL DATA:  One week history abdominal pain. Chronic hepatitis-C  EXAM: ULTRASOUND ABDOMEN COMPLETE  COMPARISON:  July 09, 2014  FINDINGS: Gallbladder: Surgically absent.  Common bile duct: Diameter: 6 mm. No intrahepatic, common hepatic, or common bile duct dilatation.  Liver: No focal lesion identified. Liver has a nodular contour and increased echogenicity diffusely.  IVC: No abnormality visualized.  Pancreas: No appreciable mass or inflammatory focus.  Spleen: Size and appearance within normal limits.  Right Kidney: Length: 11.3 cm. Echogenicity within normal limits. No perinephric fluid or hydronephrosis visualized. There is a cyst  arising from the lower pole of the right kidney containing a septation. This cyst measures 2.3 x 1.8 x 1.8 cm and was present on prior study.  Left Kidney: Length: 9.8 cm. Echogenicity within normal limits. No mass, perinephric fluid, or hydronephrosis visualized. Left kidney appears somewhat malrotated.  Abdominal aorta: No aneurysm visualized.  Other findings: No demonstrable ascites.  IMPRESSION: Liver echogenicity is increased with liver contour nodular in appearance. 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 While no focal liver lesions are identified, it must be cautioned that the sensitivity of ultrasound for focal liver lesions is diminished significantly in this circumstance.  Gallbladder is absent.  Stable slightly complex cyst arising from the lower pole right kidney, not significantly changed.  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   Electronically Signed   By: Lowella Grip III M.D.   On: 03/28/2015 09:25   Nm Myocar Multi W/spect W/wall Motion / Ef  03/30/2015   CLINICAL DATA:  Chest pain. History of hypertension, asthma and shortness of breath.  EXAM: MYOCARDIAL IMAGING WITH SPECT (REST AND PHARMACOLOGIC-STRESS)  GATED LEFT VENTRICULAR WALL MOTION STUDY  LEFT VENTRICULAR EJECTION FRACTION  TECHNIQUE: Standard myocardial SPECT imaging was performed after resting intravenous injection of 10 mCi Tc-100msestamibi. Subsequently, intravenous infusion of Lexiscan was performed under the supervision of the Cardiology staff. At peak effect of the drug, 30 mCi Tc-978mestamibi was injected intravenously and standard myocardial SPECT imaging was performed. Quantitative gated imaging was also performed to evaluate left ventricular wall motion, and estimate left ventricular ejection fraction.  COMPARISON:  Chest radiographs- 03/07/2015; chest CT - 03/29/2015  FINDINGS: Raw images: There is mild patient motion artifact involve all stress images. There is mild breast attenuation seen on  both the provided rest and stress images.  Perfusion: There is a small area of decreased perfusion involving the apical aspect of the anterior wall of the left ventricle. No scintigraphic evidence of prior infarction  Wall Motion: Normal left ventricular wall motion. No left ventricular dilation.  Left Ventricular Ejection Fraction: 74 %  End diastolic volume 48 ml  End systolic volume 13 ml  IMPRESSION: 1. Small area of decreased perfusion involving the apical aspect of the anterior wall of the left ventricle worrisome for an area pharmacologically induced ischemia. 2. No scintigraphic evidence of prior infarction 3. Normal wall motion.  Ejection fraction - 74%.   Electronically Signed   By: JoSandi Mariscal.D.   On: 03/30/2015 12:39    GHOren BinetMD  [facility] Pager:[phone] 7PM-7AM, please contact night-coverage [email or web address] Password [email or web address]/26/2016, 1:43  PM   LOS: 3 days

## 2015-03-30 NOTE — Progress Notes (Signed)
SUBJECTIVE: The patient is doing well today.  Breathing is maybe a little better.  Pulmonary consulted yesterday.  At this time, she denies chest pain or any new concerns.  [name] aspirin EC  325 mg Oral Daily  . atorvastatin  40 mg Oral q1800  . furosemide  20 mg Intravenous Q12H  . gabapentin  300 mg Oral BID  . ipratropium-albuterol  3 mL Nebulization TID  . potassium chloride SA  20 mEq Oral BID  . regadenoson  0.4 mg Intravenous Once      OBJECTIVE: Physical Exam: Filed Vitals:   03/29/15 1918 03/29/15 2033 03/29/15 2100 03/30/15 0535  BP:  168/64  163/70  Pulse:  94  91  Temp:   97.7 F (36.5 C) 98 F (36.7 C)  TempSrc:   Oral Oral  Resp:  20  20  Height:      Weight:    67.994 kg (149 lb 14.4 oz)  SpO2: 94% 96%  96%    Intake/Output Summary (Last 24 hours) at 03/30/15 0758 Last data filed at 03/29/15 1300  Gross per 24 hour  Intake    360 ml  Output    900 ml  Net   -540 ml  GEN- The patient is chronically ill appearing, alert and oriented x 3 today.   Head- normocephalic, atraumatic Eyes-  Sclera clear, conjunctiva pink Ears- hearing intact Oropharynx- clear Neck- supple,  JVP 10 cm Lungs- diffuse expiratory wheezing, slightly improved from yesterday Heart- Regular rate and rhythm  GI- + distended Skin- no rash or lesion Psych- euthymic mood, full affect Neuro- strength and sensation are intact  LABS: Basic Metabolic Panel:  Recent Labs  03/28/15 0230 03/29/15 0530 03/30/15 0436  NA 136 137 139  K 4.2 3.9 3.7  CL 104 100* 97*  CO2 26 30 33*  GLUCOSE 188* 195* 106*  BUN _0 CREATININE 1.26* 0.97 0.64  CALCIUM 8.3* 8.5* 9.0  MG 1.6*  --   --    Liver Function Tests:  Recent Labs  03/27/15 1227  AST 39  ALT 17  ALKPHOS 92  BILITOT 0.9  PROT 6.9  ALBUMIN 3.6   No results for input(s): LIPASE, AMYLASE in the last 72 hours. CBC:  Recent Labs  03/27/15 1023 03/28/15 0230 03/29/15 0530  WBC 5.3 7.0 4.8  NEUTROABS 3.0  --   --    HGB 12.5 11.6* 11.4*  HCT 40.5 36.8 36.9  MCV 100.7* 100.0 101.4*  PLT 130* 103* 82*   Cardiac Enzymes:  Recent Labs  03/27/15 1738 03/27/15 2018 03/28/15 0234  TROPONINI 0.25* 0.35* 0.35*   BNP: Invalid input(s): POCBNP D-Dimer: No results for input(s): DDIMER in the last 72 hours. Hemoglobin A1C: No results for input(s): HGBA1C in the last 72 hours. Fasting Lipid Panel:  Recent Labs  03/28/15 0230  CHOL 63  HDL 25*  LDLCALC 29  TRIG 44  CHOLHDL 2.5   Thyroid Function Tests:  Recent Labs  03/28/15 1425  TSH 2.171   Anemia Panel: No results for input(s): VITAMINB12, FOLATE, FERRITIN, TIBC, IRON, RETICCTPCT in the last 72 hours.  RADIOLOGY: Dg Chest 2 View  03/27/2015   CLINICAL DATA:  Chest pain and shortness of breath.  Chronic asthma  EXAM: CHEST  2 VIEW  COMPARISON:  Chest radiograph and chest CT Feb 21, 2014  FINDINGS: Chronic interstitial lung disease is noted diffusely, stable. There is no well-defined edema or consolidation. Heart is borderline enlarged with pulmonary vascularity  within normal limits. No adenopathy. No bone lesions.  IMPRESSION: Chronic interstitial lung disease remains unchanged. No frank edema or consolidation. Heart borderline enlarged.   Electronically Signed   By: Lowella Grip III M.D.   On: 03/27/2015 11:45   US Abdomen Complete  03/28/2015   CLINICAL DATA:  One week history abdominal pain. Chronic hepatitis-C  EXAM: ULTRASOUND ABDOMEN COMPLETE  COMPARISON:  July 09, 2014  FINDINGS: Gallbladder: Surgically absent.  Common bile duct: Diameter: 6 mm. No intrahepatic, common hepatic, or common bile duct dilatation.  Liver: No focal lesion identified. Liver has a nodular contour and increased echogenicity diffusely.  IVC: No abnormality visualized.  Pancreas: No appreciable mass or inflammatory focus.  Spleen: Size and appearance within normal limits.  Right Kidney: Length: 11.3 cm. Echogenicity within normal limits. No perinephric  fluid or hydronephrosis visualized. There is a cyst arising from the lower pole of the right kidney containing a septation. This cyst measures 2.3 x 1.8 x 1.8 cm and was present on prior study.  Left Kidney: Length: 9.8 cm. Echogenicity within normal limits. No mass, perinephric fluid, or hydronephrosis visualized. Left kidney appears somewhat malrotated.  Abdominal aorta: No aneurysm visualized.  Other findings: No demonstrable ascites.  IMPRESSION: Liver echogenicity is increased with liver contour nodular in appearance. 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 While no focal liver lesions are identified, it must be cautioned that the sensitivity of ultrasound for focal liver lesions is diminished significantly in this circumstance.  Gallbladder is absent.  Stable slightly complex cyst arising from the lower pole right kidney, not significantly changed.  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   Electronically Signed   By: Lowella Grip III M.D.   On: 03/28/2015 09:25    ASSESSMENT AND PLAN:  Principal Problem:   CHF (congestive heart failure) Active Problems:   TOBACCO USE   Essential hypertension, benign   COPD bronchitis   Hepatitis C   Dyspnea   Elevated troponin   Acute respiratory failure with hypoxia   Acute diastolic CHF (congestive heart failure)   Pulmonary HTN   1.pulmonary hypertension Echo reveals preserved EF with elevated pulmonary pressures (severe) Severe by echo and much worse when compared to 2014.  Fortunately, RV function is only mildly reduced Likely secondary and not primary.  She has significant chronic lung disease.  She also has cirrhosis--> could consider portopulmonary hypertersion.  Pulmonary has been consulted and have ordered both high res chest CT (not yet read) and VQ scan (will need to wait until Tues to do given myoview today). Gentle diuresis (though I do not think that she is very wet on exam at this time). Would therefore direct  therapy towards pulmonary hypertension as a primary effort  2. Elevated troponin This does not represent MI myoview today Off heparin  3. COPD with wheezes and rhonchi and pulmonary hypertension- per Im  4. Tobacco use she states she has stopped now but continue to reinforce need to stop  5. Thrombocytopenia Primary team to evaluate/ manage Was on heparin drip, now off  Thompson Grayer, MD 03/30/2015 7:58 AM

## 2015-03-31 DIAGNOSIS — J449 Chronic obstructive pulmonary disease, unspecified: Secondary | ICD-10-CM

## 2015-03-31 DIAGNOSIS — I5031 Acute diastolic (congestive) heart failure: Principal | ICD-10-CM

## 2015-03-31 DIAGNOSIS — I2723 Pulmonary hypertension due to lung diseases and hypoxia: Secondary | ICD-10-CM | POA: Diagnosis present

## 2015-03-31 DIAGNOSIS — J441 Chronic obstructive pulmonary disease with (acute) exacerbation: Secondary | ICD-10-CM | POA: Insufficient documentation

## 2015-03-31 DIAGNOSIS — I272 Other secondary pulmonary hypertension: Secondary | ICD-10-CM

## 2015-03-31 LAB — CBC
HCT: 41 % (ref 36.0–46.0)
Hemoglobin: 12.8 g/dL (ref 12.0–15.0)
MCH: 31.1 pg (ref 26.0–34.0)
MCHC: 31.2 g/dL (ref 30.0–36.0)
MCV: 99.8 fL (ref 78.0–100.0)
Platelets: 81 10*3/uL — ABNORMAL LOW (ref 150–400)
RBC: 4.11 MIL/uL (ref 3.87–5.11)
RDW: 15.2 % (ref 11.5–15.5)
WBC: 2.7 10*3/uL — ABNORMAL LOW (ref 4.0–10.5)

## 2015-03-31 LAB — BASIC METABOLIC PANEL
Anion gap: 8 (ref 5–15)
BUN: 8 mg/dL (ref 6–20)
CHLORIDE: 94 mmol/L — AB (ref 101–111)
CO2: 33 mmol/L — AB (ref 22–32)
Calcium: 8.8 mg/dL — ABNORMAL LOW (ref 8.9–10.3)
Creatinine, Ser: 0.8 mg/dL (ref 0.44–1.00)
GFR calc Af Amer: >60 mL/min (ref >60)
GLUCOSE: 167 mg/dL — AB (ref 65–99)
Potassium: 4.4 mmol/L (ref 3.5–5.1)
SODIUM: 135 mmol/L (ref 135–145)

## 2015-03-31 MED ORDER — FUROSEMIDE 20 MG PO TABS
20.0000 mg | ORAL_TABLET | Freq: Two times a day (BID) | ORAL | Status: DC
  Administered 2015-03-31 – 2015-04-01 (×2): 20 mg via ORAL
  Filled 2015-03-31 (×2): qty 1

## 2015-03-31 MED ORDER — AMLODIPINE BESYLATE 5 MG PO TABS
5.0000 mg | ORAL_TABLET | Freq: Every day | ORAL | Status: DC
  Administered 2015-03-31: 5 mg via ORAL
  Filled 2015-03-31 (×2): qty 1

## 2015-03-31 MED ORDER — MOMETASONE FURO-FORMOTEROL FUM 100-5 MCG/ACT IN AERO
2.0000 | INHALATION_SPRAY | Freq: Two times a day (BID) | RESPIRATORY_TRACT | Status: DC
  Administered 2015-03-31: 2 via RESPIRATORY_TRACT
  Filled 2015-03-31: qty 8.8

## 2015-03-31 NOTE — Progress Notes (Signed)
SATURATION QUALIFICATIONS: (This note is used to comply with regulatory documentation for home oxygen)  Patient Saturations on Room Air at Rest = 89-90%  Patient Saturations on Room Air while Ambulating = 84%  Patient Saturations on 2 Liters of oxygen while Ambulating =85% Patient Saturations on 3 Liters of oxygen while Ambulating=87% Patient Saturations on 4 Liters of oxygen while Ambulating=88%  Pt denies an DOE, but even with deep breathing and standing still with 4L, could not get oxygen saturations to maintain >88%.   Phyllis [name] [facility] 03/31/2015 5:43 PM

## 2015-03-31 NOTE — Progress Notes (Signed)
Name: Phyllis [name] MRN: [id number] DOB: [birth year]    ADMISSION DATE:  03/27/2015 CONSULTATION DATE:  03/29/15  REFERRING MD :  Ghimire/ [facility]  CHIEF COMPLAINT:  Short of breath on exertion  BRIEF PATIENT DESCRIPTION: 59 yoF smoker describes more awareness of dyspnea on exertion, going to store, this spring after a cold. Never really improved. Then presented for this admit with increased DOE x 2-3 weeks, swelling in face and abdomen, dry cough. Had self limited substernal heaviness, now resolved. Never told COPD diagnosis till now. ECHO  Shows pulmonary hypertension 67 mmhg.  PCCM consulted for dyspnea.    SIGNIFICANT EVENTS  6/23  Admit with DOE, substernal heaviness  STUDIES:  6/24  2D ECHO >> PHN 67 mmhg, NL RA, EF 65-70% 6/25 CT of Chest  >> moderate centrilobular emphysema, no evidence of ILD, 6 mm LLL nodule, small pericardial effusion, trace R pleural fluid, cirrhosis, chronic calcific pancreatitis   SUBJECTIVE: Pt reports feeling better since admit.  Notes a difference when not smoking.  Denies fevers/chills, sputum production.  Indicates dry cough with oxygen.    VITAL SIGNS: Temp:  [97.8 F (36.6 C)-98.2 F (36.8 C)] 97.8 F (36.6 C) (06/27 0417) Pulse Rate:  [80-113] 80 (06/27 0417) Resp:  [20] 20 (06/27 0417) BP: (137-187)/(50-88) 167/72 mmHg (06/27 0417) SpO2:  [91 %-100 %] 97 % (06/27 0749) Weight:  [146 lb 9.7 oz (66.5 kg)] 146 lb 9.7 oz (66.5 kg) (06/27 0417)  PHYSICAL EXAMINATION: General:  wdwn adult female in NAD Neuro:  Oriented x 3, movement symmetrical, Pupils equal/ round HEENT:  Conjunctivae clear, mucosa normal, no stridor, no JVD Cardiovascular:  RRR, no m/r/g Lungs:  Distant breath sounds bilaterally, diffuse exp wheeze Abdomen:  Soft, nontender, bsx4 active  Musculoskeletal:  Normal muscle mass, no acute deformities  Skin:  Clear, no rash   Recent Labs Lab 03/29/15 0530 03/30/15 0436 03/31/15 0400  NA 137 139 135  K 3.9 3.7 4.4  CL  100* 97* 94*  CO2 30 33* 33*  BUN _0 CREATININE 0.97 0.64 0.80  GLUCOSE 195* 106* 167*    Recent Labs Lab 03/28/15 0230 03/29/15 0530 03/31/15 0400  HGB 11.6* 11.4* 12.8  HCT 36.8 36.9 41.0  WBC 7.0 4.8 2.7*  PLT 103* 82* 81*   Nm Myocar Multi W/spect W/wall Motion / Ef  03/30/2015   CLINICAL DATA:  Chest pain. History of hypertension, asthma and shortness of breath.  EXAM: MYOCARDIAL IMAGING WITH SPECT (REST AND PHARMACOLOGIC-STRESS)  GATED LEFT VENTRICULAR WALL MOTION STUDY  LEFT VENTRICULAR EJECTION FRACTION  TECHNIQUE: Standard myocardial SPECT imaging was performed after resting intravenous injection of 10 mCi Tc-47msestamibi. Subsequently, intravenous infusion of Lexiscan was performed under the supervision of the Cardiology staff. At peak effect of the drug, 30 mCi Tc-952mestamibi was injected intravenously and standard myocardial SPECT imaging was performed. Quantitative gated imaging was also performed to evaluate left ventricular wall motion, and estimate left ventricular ejection fraction.  COMPARISON:  Chest radiographs- 03/07/2015; chest CT - 03/29/2015  FINDINGS: Raw images: There is mild patient motion artifact involve all stress images. There is mild breast attenuation seen on both the provided rest and stress images.  Perfusion: There is a small area of decreased perfusion involving the apical aspect of the anterior wall of the left ventricle. No scintigraphic evidence of prior infarction  Wall Motion: Normal left ventricular wall motion. No left ventricular dilation.  Left Ventricular Ejection Fraction: 74 %  End diastolic  volume 48 ml  End systolic volume 13 ml  IMPRESSION: 1. Small area of decreased perfusion involving the apical aspect of the anterior wall of the left ventricle worrisome for an area pharmacologically induced ischemia. 2. No scintigraphic evidence of prior infarction 3. Normal wall motion.  Ejection fraction - 74%.   Electronically Signed   By: Sandi Mariscal M.D.   On: 03/30/2015 12:39    ASSESSMENT / PLAN:   COPD - mixed type with acute exacerbation. Reported hx of asthma.  Plan: Transition to [facility] + PRN albuterol before discharge Will need PFT's to evaluate severity of COPD  Prednisone with taper to off  Follow up arranged for [facility] (arranged, see d/c section)  Pulmonary hypertension - suspect in setting of COPD / emphysema & chronic undiagnosed hypoxemia.  No evidence of ILD on CT.   Plan: Control COPD  Assess need for home O2 prior to discharge  VQ scan to r/o PE  Outpatient eval to consider medical therapy  Tobacco abuse - long hx, began smoking at age 16 with heaviest at 3ppd  Plan: Ongoing tobacco cessation counseling     Noe Gens, NP-C [facility] Pgr: [phone] or if no answer [phone] 03/31/2015, 10:35 AM   Reviewed above, and examined.  She was still smoking until earlier this month >> she notices breathing is better w/o cigarettes.  She reports always having wheeze and has long history of asthma.  She is not having as much cough or sputum.  She was not on home oxygen prior to this admission.  Pleasant, heart sounds regular, b/l expiratory wheezing, abd soft, no edema.  Her pulmonary hypertension is likely related to COPD/emphysema and hypoxia.    Explained importance of smoking abstinence.  Will add dulera, continue other neb BD's for now.  Will need to be assessed for home oxygen.  F/u V/Q scan >> suspicion for CTEPH is relatively low.  I did not appreciate findings for ILD on her CT chest.  Updated pt's husband at bedside.  D/w Dr. Sloan Leiter.  Chesley Mires, MD [facility] 03/31/2015, 11:20 AM Pager:  [phone] After 3pm call: [phone]

## 2015-03-31 NOTE — Progress Notes (Signed)
Patient Name: Phyllis [name] Date of Encounter: 03/31/2015     Principal Problem:   CHF (congestive heart failure) Active Problems:   TOBACCO USE   Essential hypertension, benign   COPD bronchitis   Hepatitis C   Dyspnea   Elevated troponin   Acute respiratory failure with hypoxia   Acute diastolic CHF (congestive heart failure)   Pulmonary HTN    SUBJECTIVE  Denies any CP or SOB. Had some chest pressure when she first came in with SOB, but resolved since. Some epigastric pain on palpitation. Known chronic pancreatitis since 2011. Says she decided to stop smoking now, previously smoke 2-3 cigarette per day  CURRENT MEDS . aspirin EC  325 mg Oral Daily  . atorvastatin  40 mg Oral q1800  . furosemide  20 mg Intravenous Q12H  . gabapentin  300 mg Oral BID  . ipratropium-albuterol  3 mL Nebulization TID  . potassium chloride SA  20 mEq Oral BID  . predniSONE  40 mg Oral Q breakfast  . regadenoson  0.4 mg Intravenous Once    OBJECTIVE  Filed Vitals:   03/30/15 2132 03/30/15 2300 03/31/15 0417 03/31/15 0749  BP: 184/77 137/50 167/72   Pulse: 91 98 80   Temp: 98.2 F (36.8 C)  97.8 F (36.6 C)   TempSrc: Oral  Oral   Resp: 20  20   Height:      Weight:   146 lb 9.7 oz (66.5 kg)   SpO2: 91%  95% 97%    Intake/Output Summary (Last 24 hours) at 03/31/15 0907 Last data filed at 03/31/15 0800  Gross per 24 hour  Intake    710 ml  Output   2001 ml  Net  -1291 ml   Filed Weights   03/29/15 0441 03/30/15 0535 03/31/15 0417  Weight: 155 lb 1.6 oz (70.353 kg) 149 lb 14.4 oz (67.994 kg) 146 lb 9.7 oz (66.5 kg)    PHYSICAL EXAM  General: Pleasant, NAD. Neuro: Alert and oriented X 3. Moves all extremities spontaneously. Psych: Normal affect. HEENT:  Normal  Neck: Supple without bruits or JVD. Lungs:  Resp regular and unlabored, CTA. Heart: RRR no s3, s4, or murmurs. Abdomen: Soft, non-tender, non-distended, BS + x 4.  Extremities: No clubbing, cyanosis or  edema. DP/PT/Radials 2+ and equal bilaterally.  Accessory Clinical Findings  CBC  Recent Labs  03/29/15 0530 03/31/15 0400  WBC 4.8 2.7*  HGB 11.4* 12.8  HCT 36.9 41.0  MCV 101.4* 99.8  PLT 82* 81*   Basic Metabolic Panel  Recent Labs  03/30/15 0436 03/31/15 0400  NA 139 135  K 3.7 4.4  CL 97* 94*  CO2 33* 33*  GLUCOSE 106* 167*  BUN 7 8  CREATININE 0.64 0.80  CALCIUM 9.0 8.8*  MG 1.2*  --    Thyroid Function Tests  Recent Labs  03/28/15 1425  TSH 2.171    TELE NSR with HR 80-90s, episodic tachycardia last night    ECG  No new EKG  Echocardiogram 03/28/2015  LV EF: 65% -  70%  ------------------------------------------------------------------- Indications:   Dyspnea 786.09.  ------------------------------------------------------------------- History:  PMH: Hepatitis C. Dyspnea. Coronary artery disease. Congestive heart failure. Chronic obstructive pulmonary disease.  ------------------------------------------------------------------- Study Conclusions  - Left ventricle: The cavity size was normal. Systolic function was vigorous. The estimated ejection fraction was in the range of 65% to 70%. Wall motion was normal; there were no regional wall motion abnormalities. Doppler parameters are consistent with abnormal left  ventricular relaxation (grade 1 diastolic dysfunction). Doppler parameters are consistent with elevated ventricular end-diastolic filling pressure. - Aortic valve: Trileaflet; normal thickness leaflets. There was no regurgitation. - Aortic root: The aortic root was normal in size. - Mitral valve: Structurally normal valve. There was trivial regurgitation. - Left atrium: The atrium was normal in size. - Right ventricle: Systolic function was mildly reduced. - Right atrium: The atrium was normal in size. - Tricuspid valve: There was moderate regurgitation. - Pulmonary arteries: Systolic pressure was  severely increased. PA peak pressure: 67 mm Hg (S). - Pericardium, extracardiac: A trivial pericardial effusion was identified posterior to the heart. Features were not consistent with tamponade physiology.     Radiology/Studies  Dg Chest 2 View  03/27/2015   CLINICAL DATA:  Chest pain and shortness of breath.  Chronic asthma  EXAM: CHEST  2 VIEW  COMPARISON:  Chest radiograph and chest CT Feb 21, 2014  FINDINGS: Chronic interstitial lung disease is noted diffusely, stable. There is no well-defined edema or consolidation. Heart is borderline enlarged with pulmonary vascularity within normal limits. No adenopathy. No bone lesions.  IMPRESSION: Chronic interstitial lung disease remains unchanged. No frank edema or consolidation. Heart borderline enlarged.   Electronically Signed   By: Lowella Grip III M.D.   On: 03/27/2015 11:45   US Abdomen Complete  03/28/2015   CLINICAL DATA:  One week history abdominal pain. Chronic hepatitis-C  EXAM: ULTRASOUND ABDOMEN COMPLETE  COMPARISON:  July 09, 2014  FINDINGS: Gallbladder: Surgically absent.  Common bile duct: Diameter: 6 mm. No intrahepatic, common hepatic, or common bile duct dilatation.  Liver: No focal lesion identified. Liver has a nodular contour and increased echogenicity diffusely.  IVC: No abnormality visualized.  Pancreas: No appreciable mass or inflammatory focus.  Spleen: Size and appearance within normal limits.  Right Kidney: Length: 11.3 cm. Echogenicity within normal limits. No perinephric fluid or hydronephrosis visualized. There is a cyst arising from the lower pole of the right kidney containing a septation. This cyst measures 2.3 x 1.8 x 1.8 cm and was present on prior study.  Left Kidney: Length: 9.8 cm. Echogenicity within normal limits. No mass, perinephric fluid, or hydronephrosis visualized. Left kidney appears somewhat malrotated.  Abdominal aorta: No aneurysm visualized.  Other findings: No demonstrable ascites.   IMPRESSION: Liver echogenicity is increased with liver contour nodular in appearance. 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 While no focal liver lesions are identified, it must be cautioned that the sensitivity of ultrasound for focal liver lesions is diminished significantly in this circumstance.  Gallbladder is absent.  Stable slightly complex cyst arising from the lower pole right kidney, not significantly changed.  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   Electronically Signed   By: Lowella Grip III M.D.   On: 03/28/2015 09:25   Ct Chest High Resolution  03/31/2015   CLINICAL DATA:  Pulmonary hypertension, interstitial lung changes seen in chest radiograph. COPD.  EXAM: CT CHEST WITHOUT CONTRAST  TECHNIQUE: Multidetector CT imaging of the chest was performed following the standard protocol without intravenous contrast. High resolution imaging of the lungs, as well as inspiratory and expiratory imaging, was performed.  COMPARISON:  02/21/2014.  FINDINGS: Mediastinum/Nodes: Mediastinal lymph nodes are not enlarged by CT size criteria. Hilar regions are difficult to definitively evaluate without IV contrast. No axillary adenopathy. Atherosclerotic calcification of the arterial vasculature. Heart is at the upper limits normal in size to mildly enlarged. Small pericardial effusion. Lymph node adjacent to the IVC measures  10 mm.  Lungs/Pleura: Moderate centrilobular emphysema. Scattered atelectasis and/or scarring in both lower lobes. 6 mm left lower lobe nodule (series 15, image 33) is new. No subpleural reticulation, traction bronchiectasis/ bronchiolectasis, ground-glass, architectural distortion or honeycombing. No definite air trapping. Previously described left lower lobe pulmonary sequestration is less well identified in the absence of IV contrast. Trace right pleural fluid. Airway is unremarkable.  Upper abdomen: Liver margin is markedly irregular. Cholecystectomy. Visualized  portions of the adrenal glands, kidneys and spleen are grossly unremarkable. Calcifications are seen throughout the pancreas. Scattered upper abdominal lymph nodes are sub cm in short axis size.  Musculoskeletal: No worrisome lytic or sclerotic lesions. There may be mild compression of superior endplates at T5, T6 and T7, unchanged.  IMPRESSION: 1. Moderate centrilobular emphysema. No evidence of superimposed interstitial lung disease. 2. 6 mm left lower lobe nodule, new. Given risk factors for bronchogenic carcinoma, follow-up chest CT at 6 - 12 months is recommended. This recommendation follows the consensus statement: Guidelines for Management of Small Pulmonary Nodules Detected on CT Scans: A Statement from the [place] as published in Radiology 2005;237:395-400. These results will be called to the ordering clinician or representative by the Radiologist Assistant, and communication documented in the PACS or zVision Dashboard. 3. Small pericardial effusion. 4. Trace right pleural fluid. 5. Cirrhosis. 6. Chronic calcific pancreatitis.   Electronically Signed   By: Lorin Picket M.D.   On: 03/31/2015 08:17   Nm Myocar Multi W/spect W/wall Motion / Ef  03/30/2015   CLINICAL DATA:  Chest pain. History of hypertension, asthma and shortness of breath.  EXAM: MYOCARDIAL IMAGING WITH SPECT (REST AND PHARMACOLOGIC-STRESS)  GATED LEFT VENTRICULAR WALL MOTION STUDY  LEFT VENTRICULAR EJECTION FRACTION  TECHNIQUE: Standard myocardial SPECT imaging was performed after resting intravenous injection of 10 mCi [id number]. Subsequently, intravenous infusion of Lexiscan was performed under the supervision of the Cardiology staff. At peak effect of the drug, 30 mCi [id number] was injected intravenously and standard myocardial SPECT imaging was performed. Quantitative gated imaging was also performed to evaluate left ventricular wall motion, and estimate left ventricular ejection fraction.  COMPARISON:   Chest radiographs- 03/07/2015; chest CT - 03/29/2015  FINDINGS: Raw images: There is mild patient motion artifact involve all stress images. There is mild breast attenuation seen on both the provided rest and stress images.  Perfusion: There is a small area of decreased perfusion involving the apical aspect of the anterior wall of the left ventricle. No scintigraphic evidence of prior infarction  Wall Motion: Normal left ventricular wall motion. No left ventricular dilation.  Left Ventricular Ejection Fraction: 74 %  End diastolic volume 48 ml  End systolic volume 13 ml  IMPRESSION: 1. Small area of decreased perfusion involving the apical aspect of the anterior wall of the left ventricle worrisome for an area pharmacologically induced ischemia. 2. No scintigraphic evidence of prior infarction 3. Normal wall motion.  Ejection fraction - 74%.   Electronically Signed   By: JoSandi Mariscal.D.   On: 03/30/2015 12:39    ASSESSMENT AND PLAN  1. Pulm hypertension likely 2/2 chronic pulm disease  - Echo 03/28/2015 EF 65-70%, no RWMA, grade 1 diastolic dysfunction, moderate TR, PA peak pressure 6738m, trivial pericardial effusion  - being treated for possible acute diastolic HF, on 41m48JED IV lasix, still has mild JVD, however other physical exams indicate pt is euvolemic, Cr rising, consider change back to HCTZ vs 41m34m lasix  - off all BP  medication, should restart ACEI and potentially add amlodipine. Will hold off on restarting metoprolol given pulm issue, may consider bisoprolol at some point.   - pending V/Q scan tomorrow (need 36-48 hrs after myoview per protocal)  2. Elevated trop  - myoview 03/30/2015 small area of decreased perfusion involving apical aspect of anterior wall, EF 74%. Reviewed by Dr. Rayann Heman yesterday, plan for conservative management.    (Note previous cath in 03/08/2013 by Dr. Terrence Dupont reassuring, only 10-15% plaque on cath in LCx and mid LAD and mid RCA at the time)  - continue ASA and  lipitor  3. COPD/ephysema exacerbation: on duoneb and prednisone.  4. Tobacco use  5. Thrombocytopenia  6. Chronic calcific pancreatitis  7. Cirrhosis  8. CAD, only 10-15% plaque on cath in LCx and mid LAD and mid RCA on cath 03/08/2013  Signed, Almyra Deforest PA-C Pager: [phone]   History and all data above reviewed.  Patient examined.  I agree with the findings as above.  No chest pain.  Breathing is not at baseline.   The patient exam reveals COR:RRR  ,  Lungs: Decreased breath sounds  ,  Abd: Positive bowel sounds, no rebound no guarding, Ext No edema  .  All available labs, radiology testing, previous records reviewed. Agree with documented assessment and plan. Dyspnea:  Change to PO Lasix.  No further cardiac work up.    Jeneen Rinks Laura-Lee Villegas  3:42 PM  03/31/2015

## 2015-03-31 NOTE — Care Management (Signed)
IM GIVEN TO PATIENT

## 2015-03-31 NOTE — Progress Notes (Signed)
PATIENT DETAILS Name: Phyllis [name] Age: 59 y.o. Sex: female Date of Birth: 1956/06/16 Admit Date: 03/27/2015 Admitting Physician Domenic Polite, MD FEX:[id number] Ronnald Ramp, MD  Subjective: SOB slowly improving.  Assessment/Plan: Principal Problem: Acute hypoxic respiratory failure:Likely multifactorial-from worsening severe pulmonary hypertension, underlying COPD/interstitial lung disease, and acute diastolic heart failure. Treat above conditions and attempt titrate off oxygen. May need home O2 on discharge. See below.  Active Problems: Exertional dyspnea: Likely multifactorial-from worsening severe pulmonary hypertension, underlying COPD/interstitial lung disease, and acute diastolic heart failure. Continue with gentle diuresis, see below.  Severe pulmonary hypertension: Suspect secondary to underlying COPD, chest x-ray somewhat suggestive for interstitial lung disease as well. High resolution CT chest shows moderate centrilobular emphysema without  evidence of any interstitial lung disease VQ scan pending to make sure no chronic PE-although doubt.Appreciate pulmonary recommendations.  Acute diastolic heart failure: Continue with gentle diuresis, keep an negative balance. Follow electrolytes.  Mild COPD exacerbation: Given her long history of smoking, suspect underlying COPD. Never has had a formal diagnosis as never seen a pulmonologist in the past for PFTs. Continue and steroids as wheezing.  Have counseled regarding stopping further tobacco abuse. Needs outpatient PFTs and further workup.  Chest pain: Likely atypical, however troponins elevated in a non-ACS pattern. Echocardiogram shows preserved ejection fraction, no wall motion abnormality was seen. Underwent nuclear stress test-which is positive for ischemia-per cards does not need left heart catheterization as recent LHC on 2014 was negative.  TOBACCO USE: Counseled extensively.  Essential hypertension, benign:  Uncontrolled, start amlodipine. Will start bisoprolol over the next few days, follow BP trend  Hepatitis C: Claims she was treated recently and was told that she was cured. Although normal ultrasound shows possible cirrhosis-no no ascites or significant volume overload evident on exam. No other stigmata of liver cirrhosis seen on exam. Deferred to the outpatient setting  Mild thrombocytopenia:? Secondary to cirrhosis. Follow.  Chronic pancreatitis: Stable. Tolerating diet.  Chronic pain syndrome: Continue home regimen of oxycodone  Disposition: Remain inpatient-home in 2-3 days  Antimicrobial agents  See below  Anti-infectives    None      DVT Prophylaxis:  SCD's for now-watch platelets  Code Status: Full code   Family Communication None at bedside  Procedures: None  CONSULTS:  cardiology and pulmonary/intensive care  Time spent 25 minutes-Greater than 50% of this time was spent in counseling, explanation of diagnosis, planning of further management, and coordination of care.  MEDICATIONS: Scheduled Meds: . aspirin EC  325 mg Oral Daily  . atorvastatin  40 mg Oral q1800  . furosemide  20 mg Intravenous Q12H  . gabapentin  300 mg Oral BID  . ipratropium-albuterol  3 mL Nebulization TID  . potassium chloride SA  20 mEq Oral BID  . predniSONE  40 mg Oral Q breakfast  . regadenoson  0.4 mg Intravenous Once   Continuous Infusions:  PRN Meds:.acetaminophen **OR** acetaminophen, ALPRAZolam, ondansetron **OR** ondansetron (ZOFRAN) IV, oxyCODONE, polyethylene glycol, sodium chloride    PHYSICAL EXAM: Vital signs in last 24 hours: Filed Vitals:   03/30/15 2132 03/30/15 2300 03/31/15 0417 03/31/15 0749  BP: 184/77 137/50 167/72   Pulse: 91 98 80   Temp: 98.2 F (36.8 C)  97.8 F (36.6 C)   TempSrc: Oral  Oral   Resp: 20  20   Height:      Weight:   66.5 kg (146 lb 9.7 oz)  SpO2: 91%  95% 97%    Weight change: -1.494 kg (-3 lb 4.7 oz) Filed Weights    03/29/15 0441 03/30/15 0535 03/31/15 0417  Weight: 70.353 kg (155 lb 1.6 oz) 67.994 kg (149 lb 14.4 oz) 66.5 kg (146 lb 9.7 oz)   Body mass index is 25.15 kg/(m^2).   Gen Exam: Awake and alert with clear speech.  Neck: Supple, No JVD.   Chest: +rhonchi-distant breath sounds CVS: S1 S2 Regular, no murmurs.  Abdomen: soft, BS +, non tender, non distended.  Extremities: no edema, lower extremities warm to touch. Neurologic: Non Focal.   Skin: No Rash.   Wounds: N/A.   Intake/Output from previous day:  Intake/Output Summary (Last 24 hours) at 03/31/15 1035 Last data filed at 03/31/15 0800  Gross per 24 hour  Intake    710 ml  Output   2001 ml  Net  -1291 ml     LAB RESULTS: CBC  Recent Labs Lab 03/27/15 1023 03/28/15 0230 03/29/15 0530 03/31/15 0400  WBC 5.3 7.0 4.8 2.7*  HGB 12.5 11.6* 11.4* 12.8  HCT 40.5 36.8 36.9 41.0  PLT 130* 103* 82* 81*  MCV 100.7* 100.0 101.4* 99.8  MCH 31.1 31.5 31.3 31.1  MCHC 30.9 31.5 30.9 31.2  RDW 15.3 15.3 15.6* 15.2  LYMPHSABS 1.6  --   --   --   MONOABS 0.7  --   --   --   EOSABS 0.0  --   --   --   BASOSABS 0.0  --   --   --     Chemistries   Recent Labs Lab 03/27/15 1227 03/28/15 0230 03/29/15 0530 03/30/15 0436 03/31/15 0400  NA 136 136 137 139 135  K 4.6 4.2 3.9 3.7 4.4  CL 107 104 100* 97* 94*  CO2 _0 33* 33*  GLUCOSE 88 188* 195* 106* 167*  BUN _1 CREATININE 1.27* 1.26* 0.97 0.64 0.80  CALCIUM 8.9 8.3* 8.5* 9.0 8.8*  MG  --  1.6*  --  1.2*  --     CBG: No results for input(s): GLUCAP in the last 168 hours.  GFR Estimated Creatinine Clearance: 70.1 mL/min (by C-G formula based on Cr of 0.8).  Coagulation profile  Recent Labs Lab 03/27/15 2018  INR 1.64*    Cardiac Enzymes  Recent Labs Lab 03/27/15 1738 03/27/15 2018 03/28/15 0234  TROPONINI 0.25* 0.35* 0.35*    Invalid input(s): POCBNP No results for input(s): DDIMER in the last 72 hours. No results for input(s): HGBA1C  in the last 72 hours. No results for input(s): CHOL, HDL, LDLCALC, TRIG, CHOLHDL, LDLDIRECT in the last 72 hours.  Recent Labs  03/28/15 1425  TSH 2.171   No results for input(s): VITAMINB12, FOLATE, FERRITIN, TIBC, IRON, RETICCTPCT in the last 72 hours. No results for input(s): LIPASE, AMYLASE in the last 72 hours.  Urine Studies No results for input(s): UHGB, CRYS in the last 72 hours.  Invalid input(s): UACOL, UAPR, USPG, UPH, UTP, UGL, UKET, UBIL, UNIT, UROB, ULEU, UEPI, UWBC, URBC, UBAC, CAST, UCOM, BILUA  MICROBIOLOGY: Recent Results (from the past 240 hour(s))  Urine culture     Status: None   Collection Time: 03/27/15 12:11 PM  Result Value Ref Range Status   Specimen Description URINE, CLEAN CATCH  Final   Special Requests NONE  Final   Culture   Final    MULTIPLE SPECIES PRESENT, SUGGEST RECOLLECTION IF CLINICALLY INDICATED Performed at  [facility]    Report Status 03/28/2015 FINAL  Final    RADIOLOGY STUDIES/RESULTS: Dg Chest 2 View  03/27/2015   CLINICAL DATA:  Chest pain and shortness of breath.  Chronic asthma  EXAM: CHEST  2 VIEW  COMPARISON:  Chest radiograph and chest CT Feb 21, 2014  FINDINGS: Chronic interstitial lung disease is noted diffusely, stable. There is no well-defined edema or consolidation. Heart is borderline enlarged with pulmonary vascularity within normal limits. No adenopathy. No bone lesions.  IMPRESSION: Chronic interstitial lung disease remains unchanged. No frank edema or consolidation. Heart borderline enlarged.   Electronically Signed   By: Lowella Grip III M.D.   On: 03/27/2015 11:45   US Abdomen Complete  03/28/2015   CLINICAL DATA:  One week history abdominal pain. Chronic hepatitis-C  EXAM: ULTRASOUND ABDOMEN COMPLETE  COMPARISON:  July 09, 2014  FINDINGS: Gallbladder: Surgically absent.  Common bile duct: Diameter: 6 mm. No intrahepatic, common hepatic, or common bile duct dilatation.  Liver: No focal lesion identified.  Liver has a nodular contour and increased echogenicity diffusely.  IVC: No abnormality visualized.  Pancreas: No appreciable mass or inflammatory focus.  Spleen: Size and appearance within normal limits.  Right Kidney: Length: 11.3 cm. Echogenicity within normal limits. No perinephric fluid or hydronephrosis visualized. There is a cyst arising from the lower pole of the right kidney containing a septation. This cyst measures 2.3 x 1.8 x 1.8 cm and was present on prior study.  Left Kidney: Length: 9.8 cm. Echogenicity within normal limits. No mass, perinephric fluid, or hydronephrosis visualized. Left kidney appears somewhat malrotated.  Abdominal aorta: No aneurysm visualized.  Other findings: No demonstrable ascites.  IMPRESSION: Liver echogenicity is increased with liver contour nodular in appearance. 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 While no focal liver lesions are identified, it must be cautioned that the sensitivity of ultrasound for focal liver lesions is diminished significantly in this circumstance.  Gallbladder is absent.  Stable slightly complex cyst arising from the lower pole right kidney, not significantly changed.  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   Electronically Signed   By: Lowella Grip III M.D.   On: 03/28/2015 09:25   Ct Chest High Resolution  03/31/2015   CLINICAL DATA:  Pulmonary hypertension, interstitial lung changes seen in chest radiograph. COPD.  EXAM: CT CHEST WITHOUT CONTRAST  TECHNIQUE: Multidetector CT imaging of the chest was performed following the standard protocol without intravenous contrast. High resolution imaging of the lungs, as well as inspiratory and expiratory imaging, was performed.  COMPARISON:  02/21/2014.  FINDINGS: Mediastinum/Nodes: Mediastinal lymph nodes are not enlarged by CT size criteria. Hilar regions are difficult to definitively evaluate without IV contrast. No axillary adenopathy. Atherosclerotic calcification of the  arterial vasculature. Heart is at the upper limits normal in size to mildly enlarged. Small pericardial effusion. Lymph node adjacent to the IVC measures 10 mm.  Lungs/Pleura: Moderate centrilobular emphysema. Scattered atelectasis and/or scarring in both lower lobes. 6 mm left lower lobe nodule (series 15, image 33) is new. No subpleural reticulation, traction bronchiectasis/ bronchiolectasis, ground-glass, architectural distortion or honeycombing. No definite air trapping. Previously described left lower lobe pulmonary sequestration is less well identified in the absence of IV contrast. Trace right pleural fluid. Airway is unremarkable.  Upper abdomen: Liver margin is markedly irregular. Cholecystectomy. Visualized portions of the adrenal glands, kidneys and spleen are grossly unremarkable. Calcifications are seen throughout the pancreas. Scattered upper abdominal lymph nodes are sub cm in short axis  size.  Musculoskeletal: No worrisome lytic or sclerotic lesions. There may be mild compression of superior endplates at T5, T6 and T7, unchanged.  IMPRESSION: 1. Moderate centrilobular emphysema. No evidence of superimposed interstitial lung disease. 2. 6 mm left lower lobe nodule, new. Given risk factors for bronchogenic carcinoma, follow-up chest CT at 6 - 12 months is recommended. This recommendation follows the consensus statement: Guidelines for Management of Small Pulmonary Nodules Detected on CT Scans: A Statement from the [place] as published in Radiology 2005;237:395-400. These results will be called to the ordering clinician or representative by the Radiologist Assistant, and communication documented in the PACS or zVision Dashboard. 3. Small pericardial effusion. 4. Trace right pleural fluid. 5. Cirrhosis. 6. Chronic calcific pancreatitis.   Electronically Signed   By: Lorin Picket M.D.   On: 03/31/2015 08:17   Nm Myocar Multi W/spect W/wall Motion / Ef  03/30/2015   CLINICAL DATA:  Chest  pain. History of hypertension, asthma and shortness of breath.  EXAM: MYOCARDIAL IMAGING WITH SPECT (REST AND PHARMACOLOGIC-STRESS)  GATED LEFT VENTRICULAR WALL MOTION STUDY  LEFT VENTRICULAR EJECTION FRACTION  TECHNIQUE: Standard myocardial SPECT imaging was performed after resting intravenous injection of 10 mCi Tc-73msestamibi. Subsequently, intravenous infusion of Lexiscan was performed under the supervision of the Cardiology staff. At peak effect of the drug, 30 mCi Tc-950mestamibi was injected intravenously and standard myocardial SPECT imaging was performed. Quantitative gated imaging was also performed to evaluate left ventricular wall motion, and estimate left ventricular ejection fraction.  COMPARISON:  Chest radiographs- 03/07/2015; chest CT - 03/29/2015  FINDINGS: Raw images: There is mild patient motion artifact involve all stress images. There is mild breast attenuation seen on both the provided rest and stress images.  Perfusion: There is a small area of decreased perfusion involving the apical aspect of the anterior wall of the left ventricle. No scintigraphic evidence of prior infarction  Wall Motion: Normal left ventricular wall motion. No left ventricular dilation.  Left Ventricular Ejection Fraction: 74 %  End diastolic volume 48 ml  End systolic volume 13 ml  IMPRESSION: 1. Small area of decreased perfusion involving the apical aspect of the anterior wall of the left ventricle worrisome for an area pharmacologically induced ischemia. 2. No scintigraphic evidence of prior infarction 3. Normal wall motion.  Ejection fraction - 74%.   Electronically Signed   By: JoSandi Mariscal.D.   On: 03/30/2015 12:39    GHOren BinetMD  [facility] Pager:[phone] 7PM-7AM, please contact night-coverage [email or web address] Password [email or web address] 03/31/2015, 10:35 AM   LOS: 4 days

## 2015-04-01 ENCOUNTER — Inpatient Hospital Stay (HOSPITAL_COMMUNITY): Payer: Medicare Other

## 2015-04-01 ENCOUNTER — Other Ambulatory Visit: Payer: Self-pay | Admitting: Internal Medicine

## 2015-04-01 DIAGNOSIS — IMO0001 Reserved for inherently not codable concepts without codable children: Secondary | ICD-10-CM

## 2015-04-01 LAB — BASIC METABOLIC PANEL
Anion gap: 7 (ref 5–15)
BUN: 8 mg/dL (ref 6–20)
CALCIUM: 8.9 mg/dL (ref 8.9–10.3)
CO2: 35 mmol/L — AB (ref 22–32)
Chloride: 94 mmol/L — ABNORMAL LOW (ref 101–111)
Creatinine, Ser: 0.68 mg/dL (ref 0.44–1.00)
GFR calc Af Amer: >60 mL/min (ref >60)
GFR calc non Af Amer: >60 mL/min (ref >60)
GLUCOSE: 125 mg/dL — AB (ref 65–99)
POTASSIUM: 3.7 mmol/L (ref 3.5–5.1)
SODIUM: 136 mmol/L (ref 135–145)

## 2015-04-01 MED ORDER — IPRATROPIUM-ALBUTEROL 0.5-2.5 (3) MG/3ML IN SOLN: 3.0000 mL | Freq: Three times a day (TID) | RESPIRATORY_TRACT | Status: DC

## 2015-04-01 MED ORDER — AMLODIPINE BESYLATE 10 MG PO TABS
10.0000 mg | ORAL_TABLET | Freq: Every day | ORAL | Status: DC
  Administered 2015-04-01: 10 mg via ORAL
  Filled 2015-04-01: qty 1

## 2015-04-01 MED ORDER — BISOPROLOL FUMARATE 5 MG PO TABS: 5.0000 mg | ORAL_TABLET | Freq: Every day | ORAL | Status: DC

## 2015-04-01 MED ORDER — GABAPENTIN 300 MG PO CAPS: 300.0000 mg | ORAL_CAPSULE | Freq: Two times a day (BID) | ORAL | Status: DC

## 2015-04-01 MED ORDER — POTASSIUM CHLORIDE CRYS ER 20 MEQ PO TBCR: 20.0000 meq | EXTENDED_RELEASE_TABLET | Freq: Two times a day (BID) | ORAL | Status: DC

## 2015-04-01 MED ORDER — AMLODIPINE BESYLATE 10 MG PO TABS: 10.0000 mg | ORAL_TABLET | Freq: Every day | ORAL | Status: DC

## 2015-04-01 MED ORDER — SALINE SPRAY 0.65 % NA SOLN: 1.0000 | NASAL | Status: DC | PRN

## 2015-04-01 MED ORDER — ATORVASTATIN CALCIUM 40 MG PO TABS: 40.0000 mg | ORAL_TABLET | Freq: Every day | ORAL | Status: DC

## 2015-04-01 MED ORDER — TECHNETIUM TC 99M DIETHYLENETRIAME-PENTAACETIC ACID: 39.3000 | Freq: Once | INTRAVENOUS | Status: DC | PRN

## 2015-04-01 MED ORDER — ALPRAZOLAM 0.5 MG PO TABS: 0.5000 mg | ORAL_TABLET | Freq: Three times a day (TID) | ORAL | Status: DC | PRN

## 2015-04-01 MED ORDER — TRIAMCINOLONE ACETONIDE 0.1 % EX OINT: 1.0000 "application " | TOPICAL_OINTMENT | Freq: Two times a day (BID) | CUTANEOUS | Status: DC

## 2015-04-01 MED ORDER — ALBUTEROL SULFATE 108 (90 BASE) MCG/ACT IN AEPB: 1.0000 | INHALATION_SPRAY | Freq: Four times a day (QID) | RESPIRATORY_TRACT | Status: DC | PRN

## 2015-04-01 MED ORDER — TECHNETIUM TO 99M ALBUMIN AGGREGATED: 5.8000 | Freq: Once | INTRAVENOUS | Status: DC | PRN

## 2015-04-01 MED ORDER — OXYCODONE HCL 15 MG PO TABS: 15.0000 mg | ORAL_TABLET | Freq: Four times a day (QID) | ORAL | Status: DC | PRN

## 2015-04-01 MED ORDER — MOMETASONE FURO-FORMOTEROL FUM 100-5 MCG/ACT IN AERO: 2.0000 | INHALATION_SPRAY | Freq: Two times a day (BID) | RESPIRATORY_TRACT | Status: DC

## 2015-04-01 MED ORDER — PREDNISONE 10 MG PO TABS: ORAL_TABLET | ORAL | Status: DC

## 2015-04-01 NOTE — Progress Notes (Signed)
Physical Therapy Treatment Patient Details Name: SHYNICE SIGEL MRN: [id number] DOB: [birth year] Today's Date: 04/01/2015    History of Present Illness 23 yoF smoker describes more awareness of dyspnea on exertion, , admitted 03/27/15 with increased SOB, DOE x 2-3 weeks, swelling in face and abdomen, dry cough. Had self limited substernal heaviness, . ECHO Shows pulmonary hypertension .    PT Comments    Pt progressing well with mobility but does require supplemental oxygen to achieve therapeutic level.    Follow Up Recommendations  Home health PT     Equipment Recommendations  None recommended by PT    Recommendations for Other Services       Precautions / Restrictions Precautions Precaution Comments: monitor SATS/HR Restrictions Weight Bearing Restrictions: No    Mobility  Bed Mobility Overal bed mobility: Independent                Transfers Overall transfer level: Independent                  Ambulation/Gait Ambulation/Gait assistance: Supervision Ambulation Distance (Feet): 185 Feet Assistive device: None Gait Pattern/deviations: Step-through pattern Gait velocity: WFL   General Gait Details: amb on 3 lts to achieve sats >88%.  Avg sat 89 with HR 105.  No c/o dyspnea.  No SOB.     Stairs            Wheelchair Mobility    Modified Rankin (Stroke Patients Only)       Balance                                    Cognition Arousal/Alertness: Awake/alert Behavior During Therapy: WFL for tasks assessed/performed Overall Cognitive Status: Within Functional Limits for tasks assessed                      Exercises      General Comments        Pertinent Vitals/Pain Pain Assessment: Faces Faces Pain Scale: Hurts little more Pain Location: legs Pain Descriptors / Indicators: Aching Pain Intervention(s): Monitored during session;Repositioned    Home Living                      Prior Function             PT Goals (current goals can now be found in the care plan section) Progress towards PT goals: Progressing toward goals    Frequency  Min 3X/week    PT Plan      Co-evaluation             End of Session Equipment Utilized During Treatment: Oxygen Activity Tolerance: Patient tolerated treatment well Patient left: in bed;with call bell/phone within reach     Time: 1055-1107 PT Time Calculation (min) (ACUTE ONLY): 12 min  Charges:  $Gait Training: 8-22 mins                    G Codes:      Rica Koyanagi  PTA [facility]  Acute  Rehab Pager      [phone]

## 2015-04-01 NOTE — Patient Outreach (Signed)
[facility]) Care Management  04/01/2015  Phyllis [name] 1955/10/24 [id number]   Request from Marthenia Rolling, RN to assign Community RN, assigned [name], RN.  [name]. [place], [place] Management [place] Assistant Phone: [phone] Fax: [phone]

## 2015-04-01 NOTE — Progress Notes (Signed)
Name: Phyllis [name] MRN: [id number] DOB: 1956-05-11    ADMISSION DATE:  03/27/2015 CONSULTATION DATE:  03/29/15  REFERRING MD :  Ghimire/ [facility]  CHIEF COMPLAINT:  Short of breath on exertion  BRIEF PATIENT DESCRIPTION: 59 yoF smoker describes more awareness of dyspnea on exertion, going to store, this spring after a cold. Never really improved. Then presented for this admit with increased DOE x 2-3 weeks, swelling in face and abdomen, dry cough. Had self limited substernal heaviness, now resolved. Never told COPD diagnosis till now. ECHO  Shows pulmonary hypertension 67 mmhg.  PCCM consulted for dyspnea.    SIGNIFICANT EVENTS  6/23  Admit with DOE, substernal heaviness  STUDIES:  6/24 2D ECHO >> PHN 67 mmhg, NL RA, EF 65-70% 6/25 CT of Chest  >> moderate centrilobular emphysema, no evidence of ILD, 6 mm LLL nodule, small pericardial effusion, trace R pleural fluid, cirrhosis, chronic calcific pancreatitis 6/28 V/Q scan >> low probability for PE    SUBJECTIVE:  Her breathing is better.  She was noted to have low oxygen level with exertion yesterday.  VITAL SIGNS: Temp:  [97.6 F (36.4 C)-98.4 F (36.9 C)] 98.4 F (36.9 C) (06/28 0500) Pulse Rate:  [81-102] 81 (06/28 0500) Resp:  [20] 20 (06/28 0500) BP: (152-167)/(61-80) 162/80 mmHg (06/28 0500) SpO2:  [96 %-100 %] 100 % (06/28 0500) Weight:  [147 lb 4.3 oz (66.8 kg)] 147 lb 4.3 oz (66.8 kg) (06/28 0500)  PHYSICAL EXAMINATION: General:  pleasant Neuro: normal strength HEENT: no sinus tenderness Cardiovascular: regular, no murmur Lungs: better air movement, no wheezing Abdomen:  Soft, nontender Musculoskeletal:  No edema Skin:  Clear, no rash   Recent Labs Lab 03/30/15 0436 03/31/15 0400 04/01/15 0438  NA 139 135 136  K 3.7 4.4 3.7  CL 97* 94* 94*  CO2 33* 33* 35*  BUN _0 CREATININE 0.64 0.80 0.68  GLUCOSE 106* 167* 125*    Recent Labs Lab 03/28/15 0230 03/29/15 0530 03/31/15 0400  HGB 11.6*  11.4* 12.8  HCT 36.8 36.9 41.0  WBC 7.0 4.8 2.7*  PLT 103* 82* 81*   Dg Chest 2 View  04/01/2015   CLINICAL DATA:  Chest pain, shortness of breath, history hypertension, correlation with V/Q scan  EXAM: CHEST  2 VIEW  COMPARISON:  03/27/2015  FINDINGS: Enlargement of cardiac silhouette with pulmonary vascular congestion.  Mediastinal contours normal.  Minimal atherosclerotic calcification aorta.  Emphysematous changes with linear atelectasis versus scarring at LEFT base.  Mild chronic interstitial changes of the mid to lower lungs bilaterally.  No acute infiltrate, pleural effusion or pneumothorax.  Multiple calcifications in upper abdomen consistent with chronic calcific pancreatitis.  Bones demineralized.  IMPRESSION: Emphysematous changes with chronic interstitial disease at lung base is an LEFT basilar atelectasis versus scarring.  Chronic calcific pancreatitis.   Electronically Signed   By: Lavonia Dana M.D.   On: 04/01/2015 09:27   Nm Myocar Multi W/spect W/wall Motion / Ef  03/30/2015   CLINICAL DATA:  Chest pain. History of hypertension, asthma and shortness of breath.  EXAM: MYOCARDIAL IMAGING WITH SPECT (REST AND PHARMACOLOGIC-STRESS)  GATED LEFT VENTRICULAR WALL MOTION STUDY  LEFT VENTRICULAR EJECTION FRACTION  TECHNIQUE: Standard myocardial SPECT imaging was performed after resting intravenous injection of 10 mCi Tc-35msestamibi. Subsequently, intravenous infusion of Lexiscan was performed under the supervision of the Cardiology staff. At peak effect of the drug, 30 mCi Tc-931mestamibi was injected intravenously and standard myocardial SPECT imaging was performed.  Quantitative gated imaging was also performed to evaluate left ventricular wall motion, and estimate left ventricular ejection fraction.  COMPARISON:  Chest radiographs- 03/07/2015; chest CT - 03/29/2015  FINDINGS: Raw images: There is mild patient motion artifact involve all stress images. There is mild breast attenuation seen on  both the provided rest and stress images.  Perfusion: There is a small area of decreased perfusion involving the apical aspect of the anterior wall of the left ventricle. No scintigraphic evidence of prior infarction  Wall Motion: Normal left ventricular wall motion. No left ventricular dilation.  Left Ventricular Ejection Fraction: 74 %  End diastolic volume 48 ml  End systolic volume 13 ml  IMPRESSION: 1. Small area of decreased perfusion involving the apical aspect of the anterior wall of the left ventricle worrisome for an area pharmacologically induced ischemia. 2. No scintigraphic evidence of prior infarction 3. Normal wall motion.  Ejection fraction - 74%.   Electronically Signed   By: Sandi Mariscal M.D.   On: 03/30/2015 12:39   Nm Pulmonary Perf And Vent  04/01/2015   CLINICAL DATA:  Shortness of breath for several months, chest pain that lasted a week, COPD, smoker, hypertension, history CHF  EXAM: NUCLEAR MEDICINE VENTILATION - PERFUSION LUNG SCAN  TECHNIQUE: Ventilation images were obtained in multiple projections using inhaled aerosol Tc-68mDTPA. Perfusion images were obtained in multiple projections after intravenous injection of Tc-972mAA.  RADIOPHARMACEUTICALS:  39.3 mCi Technetium-9963mPA aerosol inhalation and 5.8 mCi Technetium-31m48m IV  COMPARISON:  None; correlation chest radiograph 04/01/2015  FINDINGS: Ventilation: Central airway deposition of aerosol. Diminished ventilation in both upper lobes and at lingula. Enlargement of cardiac silhouette.  Perfusion: Matching diminished perfusion in BILATERAL upper lobes. Enlargement of cardiac silhouette. No additional segmental or subsegmental perfusion defects. Overall better perfusion than ventilation.  Chest radiograph: Enlargement of cardiac silhouette with pulmonary vascular congestion. Emphysematous changes greatest in upper lobes. Atelectasis versus scarring at LEFT base.  IMPRESSION: Matching areas of diminished ventilation and perfusion  in BILATERAL upper lobes, corresponding to emphysematous changes on chest radiograph.  Enlargement of cardiac silhouette.  Low probability for pulmonary embolism.   Electronically Signed   By: MarkLavonia Dana.   On: 04/01/2015 09:26    ASSESSMENT / PLAN:   COPD - mixed type with acute exacerbation. Reported hx of asthma.  Plan: Continue dulera as outpt PRN abluterol Will need PFT's to evaluate severity of COPD >> will arrange as outpt Wean prednisone off as tolerated over next 5 days Follow up arranged for [facility] (arranged, see d/c section)  Pulmonary hypertension - suspect in setting of COPD / emphysema & chronic undiagnosed hypoxemia.  No evidence of ILD on CT.   Plan: Control COPD  Arrange for home oxygen therapy  Tobacco abuse - long hx, began smoking at age 24 w90h heaviest at 3ppd  Plan: Ongoing tobacco cessation counseling    Okay for d/c home from pulmonary standpoint.  VineChesley Mires [facility] 04/01/2015, 10:59 AM Pager:  [phone] 3pm call: [phone]

## 2015-04-01 NOTE — Discharge Summary (Addendum)
PATIENT DETAILS Name: Phyllis [name] Age: 59 y.o. Sex: female Date of Birth: [birth year] MRN: [id number]. Admitting Physician: Domenic Polite, MD TUU:[id number] Ronnald Ramp, MD  Admit Date: 03/27/2015 Discharge date: 04/01/2015  Recommendations for Outpatient Follow-up:  1. Will need outpatient PFTs 2. Started on home O2 3. New Antihypertensives-amlodipine and bisoprolol-please titrate  4. New diuretics-Lasix   PRIMARY DISCHARGE DIAGNOSIS:  Principal Problem:   Acute respiratory failure with hypoxia Active Problems:   TOBACCO USE   Essential hypertension, benign   COPD bronchitis   Hepatitis C   Elevated troponin   Acute diastolic CHF (congestive heart failure)   COPD exacerbation   Pulmonary hypertension due to COPD      PAST MEDICAL HISTORY: Past Medical History  Diagnosis Date  . Eczema   . ASTHMA 06/06/2009  . BACK PAIN 08/06/2010  . Edema 05/20/2010  . GERD 06/06/2009  . HEPATITIS C WITHOUT HEPATIC COMA 06/06/2009  . HYPERTENSION 06/06/2009  . OSTEOPOROSIS 06/06/2009  . 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 TOBACCO USE 06/05/2010  . Unspecified hypothyroidism 06/05/2010  . VITAMIN D DEFICIENCY 06/06/2009  . Chronic pancreatitis   . DEPRESSION 06/06/2009    states she is not depressed    DISCHARGE MEDICATIONS: Current Discharge Medication List    START taking these medications   Details  amLODipine (NORVASC) 10 MG tablet Take 1 tablet (10 mg total) by mouth daily. Qty: 30 tablet, Refills: 0    atorvastatin (LIPITOR) 40 MG tablet Take 1 tablet (40 mg total) by mouth daily at 6 PM. Qty: 30 tablet, Refills: 0    bisoprolol (ZEBETA) 5 MG tablet Take 1 tablet (5 mg total) by mouth daily. Qty: 30 tablet, Refills: 0    ipratropium-albuterol (DUONEB) 0.5-2.5 (3) MG/3ML SOLN Take 3 mLs by nebulization 3 (three) times daily. Qty: 360 mL, Refills: 0    mometasone-formoterol (DULERA) 100-5 MCG/ACT AERO Inhale 2 puffs into the lungs 2 (two) times  daily. Qty: 13 g, Refills: 0    predniSONE (DELTASONE) 10 MG tablet Take 4 tablets (40 mg) daily for 2 days, then, Take 3 tablets (30 mg) daily for 2 days, then, Take 2 tablets (20 mg) daily for 2 days, then, Take 1 tablets (10 mg) daily for 1 days, then stop Qty: 19 tablet, Refills: 0    sodium chloride (OCEAN) 0.65 % SOLN nasal spray Place 1 spray into both nostrils as needed for congestion. Qty: 480 mL, Refills: 0      CONTINUE these medications which have CHANGED   Details  Albuterol Sulfate (PROAIR RESPICLICK) 491 (90 BASE) MCG/ACT AEPB Inhale 1 puff into the lungs 4 (four) times daily as needed (For shortness of breath.). Qty: 1 each, Refills: 0   Associated Diagnoses: COPD bronchitis    ALPRAZolam (XANAX) 0.5 MG tablet Take 1 tablet (0.5 mg total) by mouth 3 (three) times daily as needed for anxiety. Qty: 15 tablet, Refills: 0   Associated Diagnoses: Depression with anxiety    gabapentin (NEURONTIN) 300 MG capsule Take 1 capsule (300 mg total) by mouth 2 (two) times daily. Qty: 60 capsule, Refills: 0    oxyCODONE (ROXICODONE) 15 MG immediate release tablet Take 1 tablet (15 mg total) by mouth every 6 (six) hours as needed. For pain. Qty: 20 tablet, Refills: 0    potassium chloride SA (KLOR-CON M20) 20 MEQ tablet Take 1 tablet (20 mEq total) by mouth 2 (two) times daily. Qty: 60 tablet, Refills: 0   Associated Diagnoses: Hypokalemia  triamcinolone ointment (KENALOG) 0.1 % Apply 1 application topically 2 (two) times daily. Qty: 30 g, Refills: 0   Associated Diagnoses: Thrush      CONTINUE these medications which have NOT CHANGED   Details  aspirin EC 81 MG tablet Take 81 mg by mouth at bedtime.     ibuprofen (ADVIL,MOTRIN) 200 MG tablet Take 200-400 mg by mouth every 6 (six) hours as needed (for pain).       STOP taking these medications     hydrochlorothiazide (MICROZIDE) 12.5 MG capsule      HYDROcodone-acetaminophen (NORCO) 10-325 MG per tablet       losartan (COZAAR) 100 MG tablet      metoprolol tartrate (LOPRESSOR) 25 MG tablet         ALLERGIES:   Allergies  Allergen Reactions  . Meperidine Hcl Other (See Comments)    hallucinations  . Naproxen Other (See Comments)    Extreme bruising    BRIEF HPI:  See H&P, Labs, Consult and Test reports for all details in brief, patient is a 59 year old female with a history of presumed COPD, heavy tobacco abuse, chronic pain syndrome who was admitted with exertional dyspnea, periorbital swelling.  CONSULTATIONS:   cardiology and pulmonary/intensive care  PERTINENT RADIOLOGIC STUDIES: Dg Chest 2 View  04/01/2015   CLINICAL DATA:  Chest pain, shortness of breath, history hypertension, correlation with V/Q scan  EXAM: CHEST  2 VIEW  COMPARISON:  03/27/2015  FINDINGS: Enlargement of cardiac silhouette with pulmonary vascular congestion.  Mediastinal contours normal.  Minimal atherosclerotic calcification aorta.  Emphysematous changes with linear atelectasis versus scarring at LEFT base.  Mild chronic interstitial changes of the mid to lower lungs bilaterally.  No acute infiltrate, pleural effusion or pneumothorax.  Multiple calcifications in upper abdomen consistent with chronic calcific pancreatitis.  Bones demineralized.  IMPRESSION: Emphysematous changes with chronic interstitial disease at lung base is an LEFT basilar atelectasis versus scarring.  Chronic calcific pancreatitis.   Electronically Signed   By: Lavonia Dana M.D.   On: 04/01/2015 09:27   Dg Chest 2 View  03/27/2015   CLINICAL DATA:  Chest pain and shortness of breath.  Chronic asthma  EXAM: CHEST  2 VIEW  COMPARISON:  Chest radiograph and chest CT Feb 21, 2014  FINDINGS: Chronic interstitial lung disease is noted diffusely, stable. There is no well-defined edema or consolidation. Heart is borderline enlarged with pulmonary vascularity within normal limits. No adenopathy. No bone lesions.  IMPRESSION: Chronic interstitial lung disease  remains unchanged. No frank edema or consolidation. Heart borderline enlarged.   Electronically Signed   By: Lowella Grip III M.D.   On: 03/27/2015 11:45   US Abdomen Complete  03/28/2015   CLINICAL DATA:  One week history abdominal pain. Chronic hepatitis-C  EXAM: ULTRASOUND ABDOMEN COMPLETE  COMPARISON:  July 09, 2014  FINDINGS: Gallbladder: Surgically absent.  Common bile duct: Diameter: 6 mm. No intrahepatic, common hepatic, or common bile duct dilatation.  Liver: No focal lesion identified. Liver has a nodular contour and increased echogenicity diffusely.  IVC: No abnormality visualized.  Pancreas: No appreciable mass or inflammatory focus.  Spleen: Size and appearance within normal limits.  Right Kidney: Length: 11.3 cm. Echogenicity within normal limits. No perinephric fluid or hydronephrosis visualized. There is a cyst arising from the lower pole of the right kidney containing a septation. This cyst measures 2.3 x 1.8 x 1.8 cm and was present on prior study.  Left Kidney: Length: 9.8 cm. Echogenicity within normal limits.  No mass, perinephric fluid, or hydronephrosis visualized. Left kidney appears somewhat malrotated.  Abdominal aorta: No aneurysm visualized.  Other findings: No demonstrable ascites.  IMPRESSION: Liver echogenicity is increased with liver contour nodular in appearance. 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 While no focal liver lesions are identified, it must be cautioned that the sensitivity of ultrasound for focal liver lesions is diminished significantly in this circumstance.  Gallbladder is absent.  Stable slightly complex cyst arising from the lower pole right kidney, not significantly changed.  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   Electronically Signed   By: Lowella Grip III M.D.   On: 03/28/2015 09:25   Ct Chest High Resolution  03/31/2015   CLINICAL DATA:  Pulmonary hypertension, interstitial lung changes seen in chest radiograph. COPD.   EXAM: CT CHEST WITHOUT CONTRAST  TECHNIQUE: Multidetector CT imaging of the chest was performed following the standard protocol without intravenous contrast. High resolution imaging of the lungs, as well as inspiratory and expiratory imaging, was performed.  COMPARISON:  02/21/2014.  FINDINGS: Mediastinum/Nodes: Mediastinal lymph nodes are not enlarged by CT size criteria. Hilar regions are difficult to definitively evaluate without IV contrast. No axillary adenopathy. Atherosclerotic calcification of the arterial vasculature. Heart is at the upper limits normal in size to mildly enlarged. Small pericardial effusion. Lymph node adjacent to the IVC measures 10 mm.  Lungs/Pleura: Moderate centrilobular emphysema. Scattered atelectasis and/or scarring in both lower lobes. 6 mm left lower lobe nodule (series 15, image 33) is new. No subpleural reticulation, traction bronchiectasis/ bronchiolectasis, ground-glass, architectural distortion or honeycombing. No definite air trapping. Previously described left lower lobe pulmonary sequestration is less well identified in the absence of IV contrast. Trace right pleural fluid. Airway is unremarkable.  Upper abdomen: Liver margin is markedly irregular. Cholecystectomy. Visualized portions of the adrenal glands, kidneys and spleen are grossly unremarkable. Calcifications are seen throughout the pancreas. Scattered upper abdominal lymph nodes are sub cm in short axis size.  Musculoskeletal: No worrisome lytic or sclerotic lesions. There may be mild compression of superior endplates at T5, T6 and T7, unchanged.  IMPRESSION: 1. Moderate centrilobular emphysema. No evidence of superimposed interstitial lung disease. 2. 6 mm left lower lobe nodule, new. Given risk factors for bronchogenic carcinoma, follow-up chest CT at 6 - 12 months is recommended. This recommendation follows the consensus statement: Guidelines for Management of Small Pulmonary Nodules Detected on CT Scans: A  Statement from the [place] as published in Radiology 2005;237:395-400. These results will be called to the ordering clinician or representative by the Radiologist Assistant, and communication documented in the PACS or zVision Dashboard. 3. Small pericardial effusion. 4. Trace right pleural fluid. 5. Cirrhosis. 6. Chronic calcific pancreatitis.   Electronically Signed   By: Lorin Picket M.D.   On: 03/31/2015 08:17   Nm Myocar Multi W/spect W/wall Motion / Ef  03/30/2015   CLINICAL DATA:  Chest pain. History of hypertension, asthma and shortness of breath.  EXAM: MYOCARDIAL IMAGING WITH SPECT (REST AND PHARMACOLOGIC-STRESS)  GATED LEFT VENTRICULAR WALL MOTION STUDY  LEFT VENTRICULAR EJECTION FRACTION  TECHNIQUE: Standard myocardial SPECT imaging was performed after resting intravenous injection of 10 mCi Tc-32msestamibi. Subsequently, intravenous infusion of Lexiscan was performed under the supervision of the Cardiology staff. At peak effect of the drug, 30 mCi Tc-910mestamibi was injected intravenously and standard myocardial SPECT imaging was performed. Quantitative gated imaging was also performed to evaluate left ventricular wall motion, and estimate left ventricular ejection fraction.  COMPARISON:  Chest radiographs- 03/07/2015; chest CT - 03/29/2015  FINDINGS: Raw images: There is mild patient motion artifact involve all stress images. There is mild breast attenuation seen on both the provided rest and stress images.  Perfusion: There is a small area of decreased perfusion involving the apical aspect of the anterior wall of the left ventricle. No scintigraphic evidence of prior infarction  Wall Motion: Normal left ventricular wall motion. No left ventricular dilation.  Left Ventricular Ejection Fraction: 74 %  End diastolic volume 48 ml  End systolic volume 13 ml  IMPRESSION: 1. Small area of decreased perfusion involving the apical aspect of the anterior wall of the left ventricle worrisome  for an area pharmacologically induced ischemia. 2. No scintigraphic evidence of prior infarction 3. Normal wall motion.  Ejection fraction - 74%.   Electronically Signed   By: Sandi Mariscal M.D.   On: 03/30/2015 12:39   Nm Pulmonary Perf And Vent  04/01/2015   CLINICAL DATA:  Shortness of breath for several months, chest pain that lasted a week, COPD, smoker, hypertension, history CHF  EXAM: NUCLEAR MEDICINE VENTILATION - PERFUSION LUNG SCAN  TECHNIQUE: Ventilation images were obtained in multiple projections using inhaled aerosol Tc-55mDTPA. Perfusion images were obtained in multiple projections after intravenous injection of Tc-929mAA.  RADIOPHARMACEUTICALS:  39.3 mCi Technetium-9942mPA aerosol inhalation and 5.8 mCi Technetium-73m85m IV  COMPARISON:  None; correlation chest radiograph 04/01/2015  FINDINGS: Ventilation: Central airway deposition of aerosol. Diminished ventilation in both upper lobes and at lingula. Enlargement of cardiac silhouette.  Perfusion: Matching diminished perfusion in BILATERAL upper lobes. Enlargement of cardiac silhouette. No additional segmental or subsegmental perfusion defects. Overall better perfusion than ventilation.  Chest radiograph: Enlargement of cardiac silhouette with pulmonary vascular congestion. Emphysematous changes greatest in upper lobes. Atelectasis versus scarring at LEFT base.  IMPRESSION: Matching areas of diminished ventilation and perfusion in BILATERAL upper lobes, corresponding to emphysematous changes on chest radiograph.  Enlargement of cardiac silhouette.  Low probability for pulmonary embolism.   Electronically Signed   By: MarkLavonia Dana.   On: 04/01/2015 09:26     PERTINENT LAB RESULTS: CBC:  Recent Labs  03/31/15 0400  WBC 2.7*  HGB 12.8  HCT 41.0  PLT 81*   CMET CMP     Component Value Date/Time   NA 136 04/01/2015 0438   K 3.7 04/01/2015 0438   CL 94* 04/01/2015 0438   CO2 35* 04/01/2015 0438   GLUCOSE 125* 04/01/2015 0438    BUN 8 04/01/2015 0438   CREATININE 0.68 04/01/2015 0438   CALCIUM 8.9 04/01/2015 0438   PROT 6.9 03/27/2015 1227   ALBUMIN 3.6 03/27/2015 1227   AST 39 03/27/2015 1227   ALT 17 03/27/2015 1227   ALKPHOS 92 03/27/2015 1227   BILITOT 0.9 03/27/2015 1227   GFRNONAA >60 04/01/2015 0438   GFRAA >60 04/01/2015 0438    GFR Estimated Creatinine Clearance: 70.2 mL/min (by C-G formula based on Cr of 0.68). No results for input(s): LIPASE, AMYLASE in the last 72 hours. No results for input(s): CKTOTAL, CKMB, CKMBINDEX, TROPONINI in the last 72 hours. Invalid input(s): POCBNP No results for input(s): DDIMER in the last 72 hours. No results for input(s): HGBA1C in the last 72 hours. No results for input(s): CHOL, HDL, LDLCALC, TRIG, CHOLHDL, LDLDIRECT in the last 72 hours. No results for input(s): TSH, T4TOTAL, T3FREE, THYROIDAB in the last 72 hours.  Invalid input(s): FREET3 No results for input(s): VITAMINB12, FOLATE, FERRITIN, TIBC, IRON, RETICCTPCT  in the last 72 hours. Coags: No results for input(s): INR in the last 72 hours.  Invalid input(s): PT Microbiology: Recent Results (from the past 240 hour(s))  Urine culture     Status: None   Collection Time: 03/27/15 12:11 PM  Result Value Ref Range Status   Specimen Description URINE, CLEAN CATCH  Final   Special Requests NONE  Final   Culture   Final    MULTIPLE SPECIES PRESENT, SUGGEST RECOLLECTION IF CLINICALLY INDICATED Performed at [facility]    Report Status 03/28/2015 FINAL  Final     BRIEF HOSPITAL COURSE:  Acute hypoxic respiratory failure:Likely multifactorial-from worsening severe pulmonary hypertension, underlying COPD, and acute diastolic heart failure. Qualifies for home O2-hence will be discharged on home O2. Please continue to optimize treatment for the above-noted conditions  Active Problems: Exertional dyspnea: Likely multifactorial-from worsening severe pulmonary hypertension, underlying  COPD/interstitial lung disease, and acute diastolic heart failure. Treated with nebs, IV Lasix. Still hypoxic when she ambulates-will be discharged on home O2.   Severe pulmonary hypertension: Suspect secondary to underlying COPD, chest x-ray somewhat suggestive for interstitial lung disease as well. High resolution CT chest shows moderate centrilobular emphysema without evidence of any interstitial lung disease. VQ scan negative for chronic PE. PCCM consulted, outpatient appointment arranged for further workup including PFTs. Will be discharged on home O2.  Acute diastolic heart failure: Initially more compensated with IV Lasix. Cardiology consulted during this hospital stay. Will be discharged on oral Lasix, please continue to closely follow electrolytes in the outpatient setting.   Mild COPD exacerbation: Given her long history of smoking, suspect underlying COPD. Never has had a formal diagnosis as never seen a pulmonologist in the past for PFTs. Started on prednisone, improving, will taper prednisone over the next 5-7 days. Continue albuterol and dulera. Have counseled regarding importance of stopping further tobacco abuse. Needs outpatient PFTs and further workup.  Chest pain: Likely atypical, however troponins elevated in a non-ACS pattern. Echocardiogram shows preserved ejection fraction, no wall motion abnormality was seen. Underwent nuclear stress test-which is positive for ischemia-per cards does not need left heart catheterization as recent LHC on 2014 was negative. Continue aspirin, statins and beta blockers.  TOBACCO USE: Counseled extensively.  Essential hypertension, benign: Continue amlodipine and bisoprolol. Please titrate accordingly in the outpatient setting.   Hepatitis C: Claims she was treated recently and was told that she was cured. Although normal ultrasound shows possible cirrhosis-no no ascites or significant volume overload evident on exam. No other stigmata of liver  cirrhosis seen on exam. Deferred to the outpatient setting  Mild thrombocytopenia:? Secondary to cirrhosis. Follow closely in the outpatient setting  Chronic pancreatitis: Stable. Tolerating diet.  Chronic pain syndrome: Continue home regimen of oxycodone  TODAY-DAY OF DISCHARGE:  Subjective:   Shenekia Riess today has no headache,no chest abdominal pain,no new weakness tingling or numbness, feels much better wants to go home today.   Objective:   Blood pressure 162/80, pulse 81, temperature 98.4 F (36.9 C), temperature source Oral, resp. rate 20, height _0  (1.626 m), weight 66.8 kg (147 lb 4.3 oz), last menstrual period 10/05/1999, SpO2 100 %.  Intake/Output Summary (Last 24 hours) at 04/01/15 1232 Last data filed at 04/01/15 0534  Gross per 24 hour  Intake    240 ml  Output   1900 ml  Net  -1660 ml   Filed Weights   03/30/15 0535 03/31/15 0417 04/01/15 0500  Weight: 67.994 kg (149 lb 14.4 oz) 66.5  kg (146 lb 9.7 oz) 66.8 kg (147 lb 4.3 oz)    Exam Awake Alert, Oriented *3, No new F.N deficits, Normal affect [place].AT,PERRAL Supple Neck,No JVD, No cervical lymphadenopathy appriciated.  Symmetrical Chest wall movement, Good air movement bilaterally, CTAB RRR,No Gallops,Rubs or new Murmurs, No Parasternal Heave +ve B.Sounds, Abd Soft, Non tender, No organomegaly appriciated, No rebound -guarding or rigidity. No Cyanosis, Clubbing or edema, No new Rash or bruise  DISCHARGE CONDITION: Stable  DISPOSITION: Home  DISCHARGE INSTRUCTIONS:    Activity:  As tolerated with Full fall precautions use walker/cane & assistance as needed  Diet recommendation: Heart Healthy diet  Discharge Instructions    (HEART FAILURE PATIENTS) Call MD:  Anytime you have any of the following symptoms: 1) 3 pound weight gain in 24 hours or 5 pounds in 1 week 2) shortness of breath, with or without a dry hacking cough 3) swelling in the hands, feet or stomach 4) if you have to sleep on extra  pillows at night in order to breathe.    Complete by:  As directed      Call MD for:  difficulty breathing, headache or visual disturbances    Complete by:  As directed      Call MD for:  severe uncontrolled pain    Complete by:  As directed      Diet - low sodium heart healthy    Complete by:  As directed      Increase activity slowly    Complete by:  As directed            Follow-up Information    Follow up with SOOD,VINEET, MD On 04/09/2015.   Specialty:  Pulmonary Disease   Why:  Appt at 3:30 PM    Contact information:   [address] [address] [phone]       Follow up with [name], MD. Schedule an appointment as soon as possible for a visit in 1 week.   Specialty:  Internal Medicine   Contact information:   [address] [address] [phone]      Total Time spent on discharge equals  45 minutes.  SignedOren Binet 04/01/2015 12:32 PM

## 2015-04-01 NOTE — Progress Notes (Signed)
Follow up appointment with patient's PCP unable to be made.  Secretary on hold for 30 minutes with no option to leave a message.  Will educate patient on the importance of making follow up appointment herself.

## 2015-04-01 NOTE — Consult Note (Signed)
   [facility] CM Inpatient Consult   04/01/2015  Phyllis [name] 1956-06-09 [id number]   Came to bedside to speak with patient about [facility] for disease and symptom management for COPD. Patient endorses she lives with husband. Denies having issues with transportation or medication. Agreeable to COPD education and reinforcement however. Noted patient's RN's note about hospital follow up appointment. Writer called and was able to make appointment with Dr. Ronnald Ramp for July 5th at 145pm. [name] agreeable to date and time of hospital follow up appointment. Written consents obtained for [facility] services. Explained to patient that she will receive post hospital discharge calls and will be evaluated for monthly home visits. She endorses that she is going home on home o2. Denied having any home health needs. Made inpatient RNCM aware of visit. Will request patient to be assigned.  Marthenia Rolling, MSN-Ed, RN,BSN [facility] Liaison [phone]

## 2015-04-01 NOTE — Progress Notes (Signed)
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 Echo w/ nl EF, PAS 67, grade 1 DD.  SUBJECTIVE: Breathing better, not quite at baseline. Not on home O2, but can walk more now. No chest pain since edema improved.    OBJECTIVE Filed Vitals:   03/31/15 1516 03/31/15 1930 03/31/15 2057 04/01/15 0500  BP:   152/68 162/80  Pulse:   102 81  Temp:   98.1 F (36.7 C) 98.4 F (36.9 C)  TempSrc:   Oral Oral  Resp:   20 20  Height:      Weight:    147 lb 4.3 oz (66.8 kg)  SpO2: 98% 97% 96% 100%    Intake/Output Summary (Last 24 hours) at 04/01/15 0856 Last data filed at 04/01/15 0534  Gross per 24 hour  Intake    240 ml  Output   1900 ml  Net  -1660 ml   Filed Weights   03/30/15 0535 03/31/15 0417 04/01/15 0500  Weight: 149 lb 14.4 oz (67.994 kg) 146 lb 9.7 oz (66.5 kg) 147 lb 4.3 oz (66.8 kg)    PHYSICAL EXAM General: Well developed, well nourished, female in no acute distress. Head: Normocephalic, atraumatic.  Neck: Supple without bruits, JVD 8-9 cm. Lungs:  Resp regular and unlabored, dry rales. Heart: RRR, S1, S2, no S3, S4, or murmur; no rub. Abdomen: Soft, non-tender, non-distended, BS + x 4.  Extremities: No clubbing, cyanosis, no edema.  Neuro: Alert and oriented X 3. Moves all extremities spontaneously. Psych: Normal affect.  LABS: CBC:  Recent Labs  03/31/15 0400  WBC 2.7*  HGB 12.8  HCT 41.0  MCV 99.8  PLT 81*   Basic Metabolic Panel:  Recent Labs   03/30/15 0436 03/31/15 0400 04/01/15 0438  NA 139 135 136  K 3.7 4.4 3.7  CL 97* 94* 94*  CO2 33* 33* 35*  GLUCOSE 106* 167* 125*  BUN _0 CREATININE 0.64 0.80 0.68  CALCIUM 9.0 8.8* 8.9  MG 1.2*  --   --    BNP:  B NATRIURETIC PEPTIDE  Date/Time Value Ref Range Status  03/27/2015 10:24 AM 1043.0* 0.0 - 100.0 pg/mL Final    TELE:  SR, ST    Radiology/Studies: Nm Myocar Multi W/spect W/wall Motion / Ef 03/30/2015   CLINICAL DATA:  Chest pain. History of hypertension, asthma and shortness of breath.  EXAM: MYOCARDIAL IMAGING WITH SPECT (REST AND PHARMACOLOGIC-STRESS)  GATED LEFT VENTRICULAR WALL MOTION STUDY  LEFT VENTRICULAR EJECTION FRACTION  TECHNIQUE: Standard myocardial SPECT imaging was performed after resting intravenous injection of 10 mCi [id number]. Subsequently, intravenous infusion of Lexiscan was performed under the supervision of the Cardiology staff. At peak effect of the drug, 30 mCi [id number] was injected intravenously and standard myocardial SPECT imaging was performed. Quantitative gated imaging was also performed to evaluate left ventricular wall motion, and estimate left ventricular ejection fraction.  COMPARISON:  Chest radiographs- 03/07/2015; chest CT - 03/29/2015  FINDINGS: Raw  images: There is mild patient motion artifact involve all stress images. There is mild breast attenuation seen on both the provided rest and stress images.  Perfusion: There is a small area of decreased perfusion involving the apical aspect of the anterior wall of the left ventricle. No scintigraphic evidence of prior infarction  Wall Motion: Normal left ventricular wall motion. No left ventricular dilation.  Left Ventricular Ejection Fraction: 74 %  End diastolic volume 48 ml  End systolic volume 13 ml  IMPRESSION: 1. Small area of decreased perfusion involving the apical aspect of the anterior wall of the left ventricle worrisome for an area pharmacologically induced ischemia. 2.  No scintigraphic evidence of prior infarction 3. Normal wall motion.  Ejection fraction - 74%.   Electronically Signed   By: Sandi Mariscal M.D.   On: 03/30/2015 12:39     Current Medications:  . amLODipine  10 mg Oral Daily  . aspirin EC  325 mg Oral Daily  . atorvastatin  40 mg Oral q1800  . furosemide  20 mg Oral BID  . gabapentin  300 mg Oral BID  . ipratropium-albuterol  3 mL Nebulization TID  . mometasone-formoterol  2 puff Inhalation BID  . potassium chloride SA  20 mEq Oral BID  . predniSONE  40 mg Oral Q breakfast  . regadenoson  0.4 mg Intravenous Once      ASSESSMENT AND PLAN: 1. Acute diastolic CHF (congestive heart failure) - feels this is her dry weight.  - continue Lasix 20 po bid - follow weights - Echo 03/28/2015 EF 65-70%, no RWMA, grade 1 diastolic dysfunction, moderate TR, PA peak pressure 26mHg, trivial pericardial effusion - Will hold off on restarting metoprolol given pulm issue, may consider bisoprolol at some point. BP not well controlled, consider adding bisoprolol now.  2. Elevated trop - myoview 03/30/2015 small area of decreased perfusion involving apical aspect of anterior wall, EF 74%, plan for conservative management.  - (Note previous cath in 03/08/2013 by Dr. 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

## 2015-04-01 NOTE — Care Management Note (Signed)
Case Management Note  Patient Details  Name: Phyllis [name] MRN: [id number] Date of Birth: 09-08-1956  Subjective/Objective:         59 yo female admitted with acute respiratory failure           Action/Plan:  Spoke with patient and she does not have a neb machine at home. She stated that her aunt will be driving her home. Communicated home O2 order with Pura Spice, [facility] DME rep, and she stated that she will deliver 2 portable tanks to the patients room and the rest of the home O2 set up will be delivered to the house. The patient will be ready for discharge once the portable tanks are delivered to the room by 1:00pm. Patient verbalized understanding and had no questions or concerns. Expected Discharge Date:   (UNKNOWN)               Expected Discharge Plan:  Home/Self Care  In-House Referral:  NA  Discharge planning Services  CM Consult  Post Acute Care Choice:  Durable Medical Equipment Choice offered to:  Patient  DME Arranged:  Nebulizer machine, Oxygen DME Agency:  [place]:    [place] Agency:     Status of Service:  Completed, signed off  Medicare Important Message Given:  Yes-second notification given Date Medicare IM Given:    Medicare IM give by:    Date Additional Medicare IM Given:    Additional Medicare Important Message give by:     If discussed at [place] of Stay Meetings, dates discussed:    Additional Comments:  Scot Dock, RN 04/01/2015, 12:05 PM

## 2015-04-07 ENCOUNTER — Encounter (HOSPITAL_COMMUNITY): Payer: Self-pay | Admitting: Emergency Medicine

## 2015-04-07 ENCOUNTER — Emergency Department (HOSPITAL_COMMUNITY)
Admission: EM | Admit: 2015-04-07 | Discharge: 2015-04-07 | Disposition: A | Discharge status: Home / Self Care | Payer: Medicare Other | Attending: Emergency Medicine | Admitting: Emergency Medicine

## 2015-04-07 DIAGNOSIS — G8929 Other chronic pain: Secondary | ICD-10-CM | POA: Diagnosis not present

## 2015-04-07 DIAGNOSIS — M79606 Pain in leg, unspecified: Secondary | ICD-10-CM | POA: Insufficient documentation

## 2015-04-07 DIAGNOSIS — M810 Age-related osteoporosis without current pathological fracture: Secondary | ICD-10-CM | POA: Insufficient documentation

## 2015-04-07 DIAGNOSIS — J45909 Unspecified asthma, uncomplicated: Secondary | ICD-10-CM | POA: Diagnosis not present

## 2015-04-07 DIAGNOSIS — K219 Gastro-esophageal reflux disease without esophagitis: Secondary | ICD-10-CM | POA: Diagnosis not present

## 2015-04-07 DIAGNOSIS — Z8639 Personal history of other endocrine, nutritional and metabolic disease: Secondary | ICD-10-CM | POA: Insufficient documentation

## 2015-04-07 DIAGNOSIS — Z720 Tobacco use: Secondary | ICD-10-CM | POA: Insufficient documentation

## 2015-04-07 DIAGNOSIS — M549 Dorsalgia, unspecified: Secondary | ICD-10-CM

## 2015-04-07 DIAGNOSIS — I10 Essential (primary) hypertension: Secondary | ICD-10-CM | POA: Diagnosis not present

## 2015-04-07 DIAGNOSIS — Z79899 Other long term (current) drug therapy: Secondary | ICD-10-CM | POA: Diagnosis not present

## 2015-04-07 DIAGNOSIS — F329 Major depressive disorder, single episode, unspecified: Secondary | ICD-10-CM | POA: Diagnosis not present

## 2015-04-07 DIAGNOSIS — Z8719 Personal history of other diseases of the digestive system: Secondary | ICD-10-CM | POA: Insufficient documentation

## 2015-04-07 DIAGNOSIS — Z9889 Other specified postprocedural states: Secondary | ICD-10-CM | POA: Insufficient documentation

## 2015-04-07 DIAGNOSIS — Z7982 Long term (current) use of aspirin: Secondary | ICD-10-CM | POA: Diagnosis not present

## 2015-04-07 DIAGNOSIS — Z8619 Personal history of other infectious and parasitic diseases: Secondary | ICD-10-CM | POA: Diagnosis not present

## 2015-04-07 DIAGNOSIS — Z872 Personal history of diseases of the skin and subcutaneous tissue: Secondary | ICD-10-CM | POA: Insufficient documentation

## 2015-04-07 MED ORDER — OXYCODONE HCL 5 MG PO TABS
15.0000 mg | ORAL_TABLET | Freq: Once | ORAL | Status: AC
  Administered 2015-04-07: 15 mg via ORAL
  Filled 2015-04-07: qty 3

## 2015-04-07 NOTE — Discharge Instructions (Signed)
Chronic Back Pain  When back pain lasts longer than 3 months, it is called chronic back pain.People with chronic back pain often go through certain periods that are more intense (flare-ups).  CAUSES Chronic back pain can be caused by wear and tear (degeneration) on different structures in your back. These structures include:  The bones of your spine (vertebrae) and the joints surrounding your spinal cord and nerve roots (facets).  The strong, fibrous tissues that connect your vertebrae (ligaments). Degeneration of these structures may result in pressure on your nerves. This can lead to constant pain. HOME CARE INSTRUCTIONS  Avoid bending, heavy lifting, prolonged sitting, and activities which make the problem worse.  Take brief periods of rest throughout the day to reduce your pain. Lying down or standing usually is better than sitting while you are resting.  Take over-the-counter or prescription medicines only as directed by your caregiver. SEEK IMMEDIATE MEDICAL CARE IF:   You have weakness or numbness in one of your legs or feet.  You have trouble controlling your bladder or bowels.  You have nausea, vomiting, abdominal pain, shortness of breath, or fainting. Document Released: 10/28/2004 Document Revised: 12/13/2011 Document Reviewed: 09/04/2011 [facility] Patient Information 2015 [place], [place]. This information is not intended to replace advice given to you by your health care provider. Make sure you discuss any questions you have with your health care provider.

## 2015-04-07 NOTE — ED Provider Notes (Signed)
CSN: [id number]     Arrival date & time 04/07/15  5784 History   First MD Initiated Contact with Patient 04/07/15 [phone]     Chief Complaint  Patient presents with  . Back Pain  . Leg Pain     (Consider location/radiation/quality/duration/timing/severity/associated sxs/prior Treatment) HPI Phyllis [name] is a 59 y.o. female with a history of chronic back pain comes in for evaluation of acute back pain. Patient states her PCP generally prescribes her pain medicine for her chronic back pain, but she took her last pill yesterday and is experiencing severe pain today. She has a follow-up appointment with her PCP tomorrow. She denies any fevers, chills, chronic steroid use, IV drug use, numbness or weakness, loss of bowel or bladder function. No nausea, vomiting, abdominal pain and urinary symptoms, pelvic symptoms. She has not tried anything else to improve her symptoms. No other aggravating or modifying factors.  Past Medical History  Diagnosis Date  . Eczema   . ASTHMA 06/06/2009  . BACK PAIN 08/06/2010  . Edema 05/20/2010  . GERD 06/06/2009  . HEPATITIS C WITHOUT HEPATIC COMA 06/06/2009  . HYPERTENSION 06/06/2009  . OSTEOPOROSIS 06/06/2009  . 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 TOBACCO USE 06/05/2010  . Unspecified hypothyroidism 06/05/2010  . VITAMIN D DEFICIENCY 06/06/2009  . Chronic pancreatitis   . DEPRESSION 06/06/2009    states she is not depressed   Past Surgical History  Procedure Laterality Date  . Cholecystectomy    . Breast lumpectomy      benign, right  . Tubal ligation    . Tonsillectomy    . Lumbar laminectomy    . Left heart catheterization with coronary angiogram N/A 03/08/2013    Procedure: LEFT HEART CATHETERIZATION WITH CORONARY ANGIOGRAM;  Surgeon: Clent Demark, MD;  Location: [facility] CATH LAB;  Service: Cardiovascular;  Laterality: N/A;   Family History  Problem Relation Age of Onset  . COPD Father   . Hypertension Mother   . Kidney disease Mother    . Colon cancer Neg Hx   . Stomach cancer Neg Hx    History  Substance Use Topics  . Smoking status: Current Every Day Smoker -- 0.10 packs/day for 35 years    Types: Cigarettes  . Smokeless tobacco: Never Used     Comment: Trying to quit;   . Alcohol Use: No   OB History    No data available     Review of Systems A 10 point review of systems was completed and was negative except for pertinent positives and negatives as mentioned in the history of present illness     Allergies  Meperidine hcl and Naproxen  Home Medications   Prior to Admission medications   Medication Sig Start Date End Date Taking? Authorizing Provider  Albuterol Sulfate (PROAIR RESPICLICK) 952 (90 BASE) MCG/ACT AEPB Inhale 1 puff into the lungs 4 (four) times daily as needed (For shortness of breath.). 04/01/15   Shanker Kristeen Mans, MD  ALPRAZolam Duanne Moron) 0.5 MG tablet Take 1 tablet (0.5 mg total) by mouth 3 (three) times daily as needed for anxiety. 04/01/15   Shanker Kristeen Mans, MD  amLODipine (NORVASC) 10 MG tablet Take 1 tablet (10 mg total) by mouth daily. 04/01/15   Shanker Kristeen Mans, MD  aspirin EC 81 MG tablet Take 81 mg by mouth at bedtime.     Historical Provider, MD  atorvastatin (LIPITOR) 40 MG tablet Take 1 tablet (40 mg total) by mouth daily at 6  PM. 04/01/15   Jonetta Osgood, MD  bisoprolol (ZEBETA) 5 MG tablet Take 1 tablet (5 mg total) by mouth daily. 04/01/15   Shanker Kristeen Mans, MD  gabapentin (NEURONTIN) 300 MG capsule Take 1 capsule (300 mg total) by mouth 2 (two) times daily. 04/01/15   Shanker Kristeen Mans, MD  ibuprofen (ADVIL,MOTRIN) 200 MG tablet Take 200-400 mg by mouth every 6 (six) hours as needed (for pain).     Historical Provider, MD  ipratropium-albuterol (DUONEB) 0.5-2.5 (3) MG/3ML SOLN Take 3 mLs by nebulization 3 (three) times daily. 04/01/15   Shanker Kristeen Mans, MD  mometasone-formoterol (DULERA) 100-5 MCG/ACT AERO Inhale 2 puffs into the lungs 2 (two) times daily. 04/01/15    Shanker Kristeen Mans, MD  oxyCODONE (ROXICODONE) 15 MG immediate release tablet Take 1 tablet (15 mg total) by mouth every 6 (six) hours as needed. For pain. 04/01/15   Shanker Kristeen Mans, MD  potassium chloride SA (KLOR-CON M20) 20 MEQ tablet Take 1 tablet (20 mEq total) by mouth 2 (two) times daily. 04/01/15   Shanker Kristeen Mans, MD  predniSONE (DELTASONE) 10 MG tablet Take 4 tablets (40 mg) daily for 2 days, then, Take 3 tablets (30 mg) daily for 2 days, then, Take 2 tablets (20 mg) daily for 2 days, then, Take 1 tablets (10 mg) daily for 1 days, then stop 04/01/15   Jonetta Osgood, MD  sodium chloride (OCEAN) 0.65 % SOLN nasal spray Place 1 spray into both nostrils as needed for congestion. 04/01/15   Shanker Kristeen Mans, MD  triamcinolone ointment (KENALOG) 0.1 % Apply 1 application topically 2 (two) times daily. 04/01/15   Shanker Kristeen Mans, MD   BP 144/67 mmHg  Pulse 85  Temp(Src) 97.4 F (36.3 C) (Oral)  Resp 20  SpO2 95%  LMP 10/05/1999 Physical Exam  Constitutional: She is oriented to person, place, and time. She appears well-developed and well-nourished.  HENT:  Head: Normocephalic and atraumatic.  Mouth/Throat: Oropharynx is clear and moist.  Eyes: Conjunctivae are normal. Pupils are equal, round, and reactive to light. Right eye exhibits no discharge. Left eye exhibits no discharge. No scleral icterus.  Neck: Neck supple.  Cardiovascular: Normal rate, regular rhythm and normal heart sounds.   Pulmonary/Chest: Effort normal and breath sounds normal. No respiratory distress. She has no wheezes. She has no rales.  Abdominal: Soft. There is no tenderness.  Musculoskeletal: She exhibits no tenderness.  Full active range of motion of cervical, thoracic and lumbar spine. No overt midline bony tenderness.  Neurological: She is alert and oriented to person, place, and time.  Cranial Nerves II-XII grossly intact. Motor and sensation 5/5 in all 4 extremities. Gait is baseline without ataxia.   Skin: Skin is warm and dry. No rash noted.  Psychiatric: She has a normal mood and affect.  Nursing note and vitals reviewed.   ED Course  Procedures (including critical care time) Labs Review Labs Reviewed - No data to display  Imaging Review No results found.   EKG Interpretation None     Filed Vitals:   04/07/15 0942  BP: 144/67  Pulse: 85  Temp: 97.4 F (36.3 C)  Resp: 20   Meds given in ED:  Medications  oxyCODONE (Oxy IR/ROXICODONE) immediate release tablet 15 mg (not administered)    New Prescriptions   No medications on file    MDM  Vitals stable - WNL -afebrile Pt resting comfortably in ED. PE--normal neuro exam. Physical exam is grossly benign.  DDX--this  is a chronic problem, pain managed in the ED. no pathologic back pain red flags. No prescriptions for narcotic pain medicines will be given. No evidence of acute or emergent pathology at this time.  I discussed all relevant lab findings and imaging results with pt and they verbalized understanding. Discussed f/u with PCP within 48 hrs and return precautions, pt very amenable to plan. Patient stable, in good condition and ambulates out of the ED without difficulty.  Final diagnoses:  Bilateral back pain, unspecified location        Comer Locket, PA-C 04/07/15 1043  Virgel Manifold, MD 04/07/15 1152

## 2015-04-07 NOTE — ED Notes (Signed)
Per pt, states she was just released on Tuesday-was given 20 pain pills and ran out

## 2015-04-07 NOTE — ED Notes (Signed)
Pt report chronic bilateral leg pain; out of medication; last dose 0700 today.

## 2015-04-08 ENCOUNTER — Ambulatory Visit (INDEPENDENT_AMBULATORY_CARE_PROVIDER_SITE_OTHER): Payer: Medicare Other | Admitting: Internal Medicine

## 2015-04-08 ENCOUNTER — Other Ambulatory Visit: Payer: Self-pay | Admitting: *Deleted

## 2015-04-08 ENCOUNTER — Encounter: Payer: Self-pay | Admitting: Internal Medicine

## 2015-04-08 VITALS — BP 142/90 | HR 83 | Temp 97.7°F | Resp 20 | Ht 64.0 in | Wt 151.0 lb

## 2015-04-08 DIAGNOSIS — E038 Other specified hypothyroidism: Secondary | ICD-10-CM

## 2015-04-08 DIAGNOSIS — I2510 Atherosclerotic heart disease of native coronary artery without angina pectoris: Secondary | ICD-10-CM

## 2015-04-08 DIAGNOSIS — M5416 Radiculopathy, lumbar region: Secondary | ICD-10-CM

## 2015-04-08 DIAGNOSIS — M5417 Radiculopathy, lumbosacral region: Secondary | ICD-10-CM

## 2015-04-08 DIAGNOSIS — I5032 Chronic diastolic (congestive) heart failure: Secondary | ICD-10-CM | POA: Diagnosis not present

## 2015-04-08 DIAGNOSIS — I509 Heart failure, unspecified: Secondary | ICD-10-CM | POA: Insufficient documentation

## 2015-04-08 DIAGNOSIS — I10 Essential (primary) hypertension: Secondary | ICD-10-CM

## 2015-04-08 DIAGNOSIS — E118 Type 2 diabetes mellitus with unspecified complications: Secondary | ICD-10-CM | POA: Insufficient documentation

## 2015-04-08 DIAGNOSIS — R739 Hyperglycemia, unspecified: Secondary | ICD-10-CM

## 2015-04-08 DIAGNOSIS — F418 Other specified anxiety disorders: Secondary | ICD-10-CM

## 2015-04-08 MED ORDER — OXYCODONE HCL 15 MG PO TABS: 15.0000 mg | ORAL_TABLET | Freq: Four times a day (QID) | ORAL | Status: DC | PRN

## 2015-04-08 MED ORDER — AMLODIPINE BESYLATE 10 MG PO TABS: 10.0000 mg | ORAL_TABLET | Freq: Every day | ORAL | Status: DC

## 2015-04-08 MED ORDER — ATORVASTATIN CALCIUM 40 MG PO TABS: 40.0000 mg | ORAL_TABLET | Freq: Every day | ORAL | Status: DC

## 2015-04-08 MED ORDER — ALPRAZOLAM 0.5 MG PO TABS: 0.5000 mg | ORAL_TABLET | Freq: Two times a day (BID) | ORAL | Status: DC

## 2015-04-08 MED ORDER — BISOPROLOL FUMARATE 5 MG PO TABS: 5.0000 mg | ORAL_TABLET | Freq: Every day | ORAL | Status: DC

## 2015-04-08 NOTE — Progress Notes (Signed)
Pre visit review using our clinic review tool, if applicable. No additional management support is needed unless otherwise documented below in the visit note.

## 2015-04-08 NOTE — Patient Outreach (Signed)
[facility]) Care Management  04/08/2015  ARDITH TEST 1956/08/20 [id number]  Transition of Care initial contact.  RN spoke with pt today concerning the [facility] program and services. Pt states she continues to recover well with no major issues. States reports she was diagnosed with both COPD and HF while in the hospital however indicates her main issues related to her COPD. Pt confirms she is familiar with COPD due to a family member however feels she need ongoing education in manager her medical problem. Pt confirms she is using her nebulizer treatments and inhalers along with her home O2 concentrator([employer]) currently set on 4 liters. Reports she is pending an appointment scheduled with her pulmonologist on 7/6.  Pt reports visiting her primary doctor today (Dr. Ronnald Ramp) and taking all her prescribed medications as recommended with no problems. Pt states she stopped smoking last Thursday AM due to her recent hospitalization. Praise pt for her efforts and continues to offer support if needed.   Pt reports she is having some swelling to her ankles and recently spoke with the insurance nurse who has instructed her to elevate her legs. RN reiterated on the purpose to reduce ongoing swelling and further discussion on compression stockings if needed in the future. Pt also indicated she would obtained a set of new scales within 48 hours. Upon offering a home visit pt receptive and a date was scheduled for next Wednesday based upon accommodating the pt.   [name], RN Care Management Coordinator [facility] Main Office [phone]

## 2015-04-08 NOTE — Patient Instructions (Signed)
Heart Failure Heart failure is a condition in which the heart has trouble pumping blood. This means your heart does not pump blood efficiently for your body to work well. In some cases of heart failure, fluid may back up into your lungs or you may have swelling (edema) in your lower legs. Heart failure is usually a long-term (chronic) condition. It is important for you to take good care of yourself and follow your health care provider's treatment plan. CAUSES  Some health conditions can cause heart failure. Those health conditions include:  High blood pressure (hypertension). Hypertension causes the heart muscle to work harder than normal. When pressure in the blood vessels is high, the heart needs to pump (contract) with more force in order to circulate blood throughout the body. High blood pressure eventually causes the heart to become stiff and weak.  Coronary artery disease (CAD). CAD is the buildup of cholesterol and fat (plaque) in the arteries of the heart. The blockage in the arteries deprives the heart muscle of oxygen and blood. This can cause chest pain and may lead to a heart attack. High blood pressure can also contribute to CAD.  Heart attack (myocardial infarction). A heart attack occurs when one or more arteries in the heart become blocked. The loss of oxygen damages the muscle tissue of the heart. When this happens, part of the heart muscle dies. The injured tissue does not contract as well and weakens the heart's ability to pump blood.  Abnormal heart valves. When the heart valves do not open and close properly, it can cause heart failure. This makes the heart muscle pump harder to keep the blood flowing.  Heart muscle disease (cardiomyopathy or myocarditis). Heart muscle disease is damage to the heart muscle from a variety of causes. These can include drug or alcohol abuse, infections, or unknown reasons. These can increase the risk of heart failure.  Lung disease. Lung disease  makes the heart work harder because the lungs do not work properly. This can cause a strain on the heart, leading it to fail.  Diabetes. Diabetes increases the risk of heart failure. High blood sugar contributes to high fat (lipid) levels in the blood. Diabetes can also cause slow damage to tiny blood vessels that carry important nutrients to the heart muscle. When the heart does not get enough oxygen and food, it can cause the heart to become weak and stiff. This leads to a heart that does not contract efficiently.  Other conditions can contribute to heart failure. These include abnormal heart rhythms, thyroid problems, and low blood counts (anemia). Certain unhealthy behaviors can increase the risk of heart failure, including:  Being overweight.  Smoking or chewing tobacco.  Eating foods high in fat and cholesterol.  Abusing illicit drugs or alcohol.  Lacking physical activity. SYMPTOMS  Heart failure symptoms may vary and can be hard to detect. Symptoms may include:  Shortness of breath with activity, such as climbing stairs.  Persistent cough.  Swelling of the feet, ankles, legs, or abdomen.  Unexplained weight gain.  Difficulty breathing when lying flat (orthopnea).  Waking from sleep because of the need to sit up and get more air.  Rapid heartbeat.  Fatigue and loss of energy.  Feeling light-headed, dizzy, or close to fainting.  Loss of appetite.  Nausea.  Increased urination during the night (nocturia). DIAGNOSIS  A diagnosis of heart failure is based on your history, symptoms, physical examination, and diagnostic tests. Diagnostic tests for heart failure may include:  Echocardiography. °· Electrocardiography. °· Chest X-ray. °· Blood tests. °· Exercise stress test. °· Cardiac angiography. °· Radionuclide scans. °TREATMENT  °Treatment is aimed at managing the symptoms of heart failure. Medicines, behavioral changes, or surgical intervention may be necessary to  treat heart failure. °· Medicines to help treat heart failure may include: °¨ Angiotensin-converting enzyme (ACE) inhibitors. This type of medicine blocks the effects of a blood protein called angiotensin-converting enzyme. ACE inhibitors relax (dilate) the blood vessels and help lower blood pressure. °¨ Angiotensin receptor blockers (ARBs). This type of medicine blocks the actions of a blood protein called angiotensin. Angiotensin receptor blockers dilate the blood vessels and help lower blood pressure. °¨ Water pills (diuretics). Diuretics cause the kidneys to remove salt and water from the blood. The extra fluid is removed through urination. This loss of extra fluid lowers the volume of blood the heart pumps. °¨ Beta blockers. These prevent the heart from beating too fast and improve heart muscle strength. °¨ Digitalis. This increases the force of the heartbeat. °· Healthy behavior changes include: °¨ Obtaining and maintaining a healthy weight. °¨ Stopping smoking or chewing tobacco. °¨ Eating heart-healthy foods. °¨ Limiting or avoiding alcohol. °¨ Stopping illicit drug use. °¨ Physical activity as directed by your health care provider. °· Surgical treatment for heart failure may include: °¨ A procedure to open blocked arteries, repair damaged heart valves, or remove damaged heart muscle tissue. °¨ A pacemaker to improve heart muscle function and control certain abnormal heart rhythms. °¨ An internal cardioverter defibrillator to treat certain serious abnormal heart rhythms. °¨ A left ventricular assist device (LVAD) to assist the pumping ability of the heart. °HOME CARE INSTRUCTIONS  °· Take medicines only as directed by your health care provider. Medicines are important in reducing the workload of your heart, slowing the progression of heart failure, and improving your symptoms. °¨ Do not stop taking your medicine unless directed by your health care provider. °¨ Do not skip any dose of medicine. °¨ Refill your  prescriptions before you run out of medicine. Your medicines are needed every day. °· Engage in moderate physical activity if directed by your health care provider. Moderate physical activity can benefit some people. The elderly and people with severe heart failure should consult with a health care provider for physical activity recommendations. °· Eat heart-healthy foods. Food choices should be free of trans fat and low in saturated fat, cholesterol, and salt (sodium). Healthy choices include fresh or frozen fruits and vegetables, fish, lean meats, legumes, fat-free or low-fat dairy products, and whole grain or high fiber foods. Talk to a dietitian to learn more about heart-healthy foods. °· Limit sodium if directed by your health care provider. Sodium restriction may reduce symptoms of heart failure in some people. Talk to a dietitian to learn more about heart-healthy seasonings. °· Use healthy cooking methods. Healthy cooking methods include roasting, grilling, broiling, baking, poaching, steaming, or stir-frying. Talk to a dietitian to learn more about healthy cooking methods. °· Limit fluids if directed by your health care provider. Fluid restriction may reduce symptoms of heart failure in some people. °· Weigh yourself every day. Daily weights are important in the early recognition of excess fluid. You should weigh yourself every morning after you urinate and before you eat breakfast. Wear the same amount of clothing each time you weigh yourself. Record your daily weight. Provide your health care provider with your weight record. °· Monitor and record your blood pressure if directed by your health care   provider.  Check your pulse if directed by your health care provider.  Lose weight if directed by your health care provider. Weight loss may reduce symptoms of heart failure in some people.  Stop smoking or chewing tobacco. Nicotine makes your heart work harder by causing your blood vessels to constrict.  Do not use nicotine gum or patches before talking to your health care provider.  Keep all follow-up visits as directed by your health care provider. This is important.  Limit alcohol intake to no more than 1 drink per day for nonpregnant women and 2 drinks per day for men. One drink equals 12 ounces of beer, 5 ounces of wine, or 1 ounces of hard liquor. Drinking more than that is harmful to your heart. Tell your health care provider if you drink alcohol several times a week. Talk with your health care provider about whether alcohol is safe for you. If your heart has already been damaged by alcohol or you have severe heart failure, drinking alcohol should be stopped completely.  Stop illicit drug use.  Stay up-to-date with immunizations. It is especially important to prevent respiratory infections through current pneumococcal and influenza immunizations.  Manage other health conditions such as hypertension, diabetes, thyroid disease, or abnormal heart rhythms as directed by your health care provider.  Learn to manage stress.  Plan rest periods when fatigued.  Learn strategies to manage high temperatures. If the weather is extremely hot:  Avoid vigorous physical activity.  Use air conditioning or fans or seek a cooler location.  Avoid caffeine and alcohol.  Wear loose-fitting, lightweight, and light-colored clothing.  Learn strategies to manage cold temperatures. If the weather is extremely cold:  Avoid vigorous physical activity.  Layer clothes.  Wear mittens or gloves, a hat, and a scarf when going outside.  Avoid alcohol.  Obtain ongoing education and support as needed.  Participate in or seek rehabilitation as needed to maintain or improve independence and quality of life. SEEK MEDICAL CARE IF:   Your weight increases by 03 lb/1.4 kg in 1 day or 05 lb/2.3 kg in a week.  You have increasing shortness of breath that is unusual for you.  You are unable to participate in  your usual physical activities.  You tire easily.  You cough more than normal, especially with physical activity.  You have any or more swelling in areas such as your hands, feet, ankles, or abdomen.  You are unable to sleep because it is hard to breathe.  You feel like your heart is beating fast (palpitations).  You become dizzy or light-headed upon standing up. SEEK IMMEDIATE MEDICAL CARE IF:   You have difficulty breathing.  There is a change in mental status such as decreased alertness or difficulty with concentration.  You have a pain or discomfort in your chest.  You have an episode of fainting (syncope). MAKE SURE YOU:   Understand these instructions.  Will watch your condition.  Will get help right away if you are not doing well or get worse. Document Released: 09/20/2005 Document Revised: 02/04/2014 Document Reviewed: 10/20/2012 [facility] Patient Information 2015 [place], [place]. This information is not intended to replace advice given to you by your health care provider. Make sure you discuss any questions you have with your health care provider.

## 2015-04-08 NOTE — Progress Notes (Signed)
Subjective:  Patient ID: Phyllis [name], female    DOB: 03/25/1956  Age: 59 y.o. MRN: [id number]  CC: Hospitalization Follow-up; Back Pain; Hypertension; and Congestive Heart Failure   HPI Phyllis [name] presents for follow up after a recent admission for COPD exacerbation and a new dx of diastolic dysfunction CHF, she feels much better since discharge with less SOB and DOE on 02. She has not had any CP. She complains of low back pain today, she describes it as constant throbbing and stabbing with N/W/T in her LE's. She has previously had low back surgery. She gets pain relief with the occasional dose of oxycodone.  Outpatient Prescriptions Prior to Visit  Medication Sig Dispense Refill  . Albuterol Sulfate (PROAIR RESPICLICK) 093 (90 BASE) MCG/ACT AEPB Inhale 1 puff into the lungs 4 (four) times daily as needed (For shortness of breath.). 1 each 0  . aspirin EC 81 MG tablet Take 81 mg by mouth at bedtime.     . gabapentin (NEURONTIN) 300 MG capsule Take 1 capsule (300 mg total) by mouth 2 (two) times daily. 60 capsule 0  . ibuprofen (ADVIL,MOTRIN) 200 MG tablet Take 200-400 mg by mouth every 6 (six) hours as needed (for pain).     [name] ipratropium-albuterol (DUONEB) 0.5-2.5 (3) MG/3ML SOLN Take 3 mLs by nebulization 3 (three) times daily. 360 mL 0  . mometasone-formoterol (DULERA) 100-5 MCG/ACT AERO Inhale 2 puffs into the lungs 2 (two) times daily. 13 g 0  . potassium chloride SA (KLOR-CON M20) 20 MEQ tablet Take 1 tablet (20 mEq total) by mouth 2 (two) times daily. 60 tablet 0  . sodium chloride (OCEAN) 0.65 % SOLN nasal spray Place 1 spray into both nostrils as needed for congestion. 480 mL 0  . triamcinolone ointment (KENALOG) 0.1 % Apply 1 application topically 2 (two) times daily. 30 g 0  . ALPRAZolam (XANAX) 0.5 MG tablet Take 1 tablet (0.5 mg total) by mouth 3 (three) times daily as needed for anxiety. 15 tablet 0  . amLODipine (NORVASC) 10 MG tablet Take 1 tablet (10 mg total) by  mouth daily. 30 tablet 0  . atorvastatin (LIPITOR) 40 MG tablet Take 1 tablet (40 mg total) by mouth daily at 6 PM. 30 tablet 0  . bisoprolol (ZEBETA) 5 MG tablet Take 1 tablet (5 mg total) by mouth daily. 30 tablet 0  . oxyCODONE (ROXICODONE) 15 MG immediate release tablet Take 1 tablet (15 mg total) by mouth every 6 (six) hours as needed. For pain. 20 tablet 0  . predniSONE (DELTASONE) 10 MG tablet Take 4 tablets (40 mg) daily for 2 days, then, Take 3 tablets (30 mg) daily for 2 days, then, Take 2 tablets (20 mg) daily for 2 days, then, Take 1 tablets (10 mg) daily for 1 days, then stop (Patient not taking: Reported on 04/08/2015) 19 tablet 0   No facility-administered medications prior to visit.    ROS Review of Systems  Constitutional: Negative.  Negative for fever, chills, diaphoresis, appetite change and fatigue.  HENT: Negative.  Negative for trouble swallowing and voice change.   Eyes: Negative.   Respiratory: Negative.  Negative for cough, choking, chest tightness, shortness of breath and stridor.   Cardiovascular: Negative.  Negative for chest pain, palpitations and leg swelling.  Gastrointestinal: Negative.  Negative for nausea, vomiting, abdominal pain, diarrhea, constipation and blood in stool.  Endocrine: Negative.   Genitourinary: Negative.   Musculoskeletal: Positive for back pain. Negative for myalgias, joint swelling,  arthralgias, gait problem, neck pain and neck stiffness.  Skin: Negative.  Negative for rash.  Allergic/Immunologic: Negative.   Neurological: Negative.  Negative for dizziness, syncope, speech difficulty, weakness, light-headedness, numbness and headaches.  Hematological: Negative.   Psychiatric/Behavioral: Positive for sleep disturbance. Negative for suicidal ideas, confusion, self-injury, dysphoric mood and decreased concentration. The patient is nervous/anxious.     Objective:  BP 142/90 mmHg  Pulse 83  Temp(Src) 97.7 F (36.5 C) (Oral)  Resp 20   Ht _0  (1.626 m)  Wt 151 lb (68.493 kg)  BMI 25.91 kg/m2  SpO2 93%  LMP 10/05/1999  BP Readings from Last 3 Encounters:  04/08/15 142/90  04/07/15 137/73  04/01/15 162/80    Wt Readings from Last 3 Encounters:  04/08/15 151 lb (68.493 kg)  04/01/15 147 lb 4.3 oz (66.8 kg)  02/06/15 142 lb 8 oz (64.638 kg)    Physical Exam  Constitutional: She is oriented to person, place, and time. She appears well-developed and well-nourished.  Non-toxic appearance. She has a sickly appearance. She does not appear ill. No distress.  She is on continuous O2  HENT:  Head: Normocephalic and atraumatic.  Mouth/Throat: Oropharynx is clear and moist. No oropharyngeal exudate.  Eyes: Conjunctivae are normal. Right eye exhibits no discharge. Left eye exhibits no discharge. No scleral icterus.  Neck: Normal range of motion. Neck supple. No JVD present. No tracheal deviation present. No thyromegaly present.  Cardiovascular: Normal rate, regular rhythm, normal heart sounds and intact distal pulses.  Exam reveals no gallop and no friction rub.   No murmur heard. Pulmonary/Chest: Effort normal and breath sounds normal. No stridor. No respiratory distress. She has no wheezes. She has no rales. She exhibits no tenderness.  Abdominal: Soft. Bowel sounds are normal. She exhibits no distension. There is no tenderness. There is no rebound and no guarding.  Musculoskeletal: Normal range of motion. She exhibits edema (there is trace edema over both ankles). She exhibits no tenderness.  Lymphadenopathy:    She has no cervical adenopathy.  Neurological: She is alert and oriented to person, place, and time. She has normal strength. She displays no atrophy, no tremor and normal reflexes. No cranial nerve deficit or sensory deficit. She exhibits normal muscle tone. She displays a negative Romberg sign. She displays no seizure activity. Coordination normal. She displays no Babinski's sign on the right side. She displays no  Babinski's sign on the left side.  Reflex Scores:      Tricep reflexes are 1+ on the right side and 1+ on the left side.      Bicep reflexes are 1+ on the right side and 1+ on the left side.      Brachioradialis reflexes are 1+ on the right side and 1+ on the left side.      Patellar reflexes are 1+ on the right side and 1+ on the left side.      Achilles reflexes are 1+ on the right side and 1+ on the left side. Neg SLR in BLE  Skin: Skin is warm and dry. No rash noted. She is not diaphoretic. No erythema. No pallor.  Psychiatric: She has a normal mood and affect. Her behavior is normal. Judgment and thought content normal.  Vitals reviewed.   Lab Results  Component Value Date   WBC 2.7* 03/31/2015   HGB 12.8 03/31/2015   HCT 41.0 03/31/2015   PLT 81* 03/31/2015   GLUCOSE 125* 04/01/2015   CHOL 63 03/28/2015   TRIG 44  03/28/2015   HDL 25* 03/28/2015   LDLCALC 29 03/28/2015   ALT 17 03/27/2015   AST 39 03/27/2015   NA 136 04/01/2015   K 3.7 04/01/2015   CL 94* 04/01/2015   CREATININE 0.68 04/01/2015   BUN 8 04/01/2015   CO2 35* 04/01/2015   TSH 2.171 03/28/2015   INR 1.64* 03/27/2015   HGBA1C 5.7 07/27/2013    No results found.  Assessment & Plan:   Earlyn was seen today for hospitalization follow-up, back pain, hypertension and congestive heart failure.  Diagnoses and all orders for this visit:  Other specified hypothyroidism-recent TSh was in the normal range  Essential hypertension, benign- her BP is well controlled Orders: -     amLODipine (NORVASC) 10 MG tablet; Take 1 tablet (10 mg total) by mouth daily. -     bisoprolol (ZEBETA) 5 MG tablet; Take 1 tablet (5 mg total) by mouth daily.  Hyperglycemia  CHF NYHA class I, chronic, diastolic- her volume status is much better today, will cont the current meds Orders: -     amLODipine (NORVASC) 10 MG tablet; Take 1 tablet (10 mg total) by mouth daily. -     bisoprolol (ZEBETA) 5 MG tablet; Take 1 tablet (5  mg total) by mouth daily.  Right lumbar radiculitis- will cont oxycodone and I have asked her to see pain management Orders: -     oxyCODONE (ROXICODONE) 15 MG immediate release tablet; Take 1 tablet (15 mg total) by mouth every 6 (six) hours as needed. For pain.  Coronary artery disease involving native coronary artery of native heart without angina pectoris Orders: -     atorvastatin (LIPITOR) 40 MG tablet; Take 1 tablet (40 mg total) by mouth daily at 6 PM.  Depression with anxiety Orders: -     ALPRAZolam (XANAX) 0.5 MG tablet; Take 1 tablet (0.5 mg total) by mouth 2 (two) times daily.   I have discontinued Ms. Bonenfant's predniSONE. I have also changed her ALPRAZolam. Additionally, I am having her maintain her ibuprofen, aspirin EC, Albuterol Sulfate, gabapentin, potassium chloride SA, triamcinolone ointment, ipratropium-albuterol, mometasone-formoterol, sodium chloride, oxyCODONE, amLODipine, bisoprolol, and atorvastatin.  Meds ordered this encounter  Medications  . oxyCODONE (ROXICODONE) 15 MG immediate release tablet    Sig: Take 1 tablet (15 mg total) by mouth every 6 (six) hours as needed. For pain.    Dispense:  30 tablet    Refill:  0  . amLODipine (NORVASC) 10 MG tablet    Sig: Take 1 tablet (10 mg total) by mouth daily.    Dispense:  30 tablet    Refill:  11  . ALPRAZolam (XANAX) 0.5 MG tablet    Sig: Take 1 tablet (0.5 mg total) by mouth 2 (two) times daily.    Dispense:  50 tablet    Refill:  2  . bisoprolol (ZEBETA) 5 MG tablet    Sig: Take 1 tablet (5 mg total) by mouth daily.    Dispense:  30 tablet    Refill:  11  . atorvastatin (LIPITOR) 40 MG tablet    Sig: Take 1 tablet (40 mg total) by mouth daily at 6 PM.    Dispense:  30 tablet    Refill:  11     Follow-up: Return in about 4 months (around 08/09/2015).  [name], MD

## 2015-04-09 ENCOUNTER — Other Ambulatory Visit: Payer: Medicare Other

## 2015-04-09 ENCOUNTER — Encounter: Payer: Self-pay | Admitting: Pulmonary Disease

## 2015-04-09 ENCOUNTER — Ambulatory Visit (INDEPENDENT_AMBULATORY_CARE_PROVIDER_SITE_OTHER): Payer: Medicare Other | Admitting: Pulmonary Disease

## 2015-04-09 VITALS — BP 136/70 | HR 89 | Ht 64.0 in | Wt 156.0 lb

## 2015-04-09 DIAGNOSIS — R609 Edema, unspecified: Secondary | ICD-10-CM

## 2015-04-09 DIAGNOSIS — R911 Solitary pulmonary nodule: Secondary | ICD-10-CM

## 2015-04-09 DIAGNOSIS — J9611 Chronic respiratory failure with hypoxia: Secondary | ICD-10-CM | POA: Diagnosis not present

## 2015-04-09 DIAGNOSIS — J432 Centrilobular emphysema: Secondary | ICD-10-CM | POA: Diagnosis not present

## 2015-04-09 NOTE — Progress Notes (Signed)
Chief Complaint  Patient presents with  . Follow-up    Pt discharged from hospital one week ago. Pt states chronic wheezing from asthma and non-productive cough. Denies SOB, chest pain or discomfort, fevers, hemoptysis. Pt states edema in bilateral lower ext     History of Present Illness: Phyllis [name] is a 59 y.o. 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  She is here for hospital follow up.  She is off prednisone.    She is using dulera and is using duoneb tid >> these help.  She is not having cough, wheeze, or sputum.  She has been using 4 liters oxygen 24/7.  She has noticed more ankle swelling since starting amlodipine.  TESTS: Echo 03/28/15 >> EF 65 to 70%, PAH 67 mmHg CT of Chest 03/29/15 >> moderate centrilobular emphysema, 6 mm LLL nodule, small pericardial effusion, trace R pleural fluid, cirrhosis, chronic calcific pancreatitis V/Q scan 04/01/15 >> low probability for PE   Past medical hx >> GERD, Hepatitis C with cirrhosis, Chronic pancreatitis, HTN, Neuropathy, Hypothyroidism, Depression  Past surgical hx, Medications, Allergies, Family hx, Social hx all reviewed.   Physical Exam: BP 136/70 mmHg  Pulse 89  Ht 5' 4" (1.626 m)  Wt 156 lb (70.761 kg)  BMI 26.76 kg/m2  SpO2 97%  LMP 10/05/1999  General - No distress, wearing oxygen ENT - No sinus tenderness, no oral exudate, no LAN Cardiac - s1s2 regular, no murmur Chest - No wheeze/rales/dullness Back - No focal tenderness Abd - Soft, non-tender Ext - No edema Neuro - Normal strength Skin - No rashes Psych - normal mood, and behavior   Assessment/Plan:  COPD with emphysema. Plan: - continue dulera, duoneb - arrange for PFT, A1AT level - refer to pulmonary rehab  Chronic respiratory failure with hypoxia. Plan: - continue 3 liters oxygen 24/7 >> re-assess oxygen needs at next follow up  Lt lower lung nodule. Plan: - follow up CT chest  w/o contrast in June 2017  Ankle edema. She notes this after starting amlodipine. Plan: - advised her to d/w her PCP   Chesley Mires, MD [facility] Pager:  [phone]

## 2015-04-09 NOTE — Addendum Note (Signed)
Addended by: Virl Cagey on: 04/09/2015 04:42 PM   Modules accepted: Orders

## 2015-04-09 NOTE — Patient Instructions (Addendum)
Lab test today Will arrange for pulmonary rehab Will arrange for pulmonary function test Use 3 liters oxygen  Follow up in 3 months

## 2015-04-14 LAB — ALPHA-1 ANTITRYPSIN PHENOTYPE: A1 ANTITRYPSIN: 170 mg/dL (ref 83–199)

## 2015-04-15 ENCOUNTER — Telehealth: Payer: Self-pay | Admitting: Pulmonary Disease

## 2015-04-15 NOTE — Telephone Encounter (Signed)
A1AT level 04/09/15 >> 170, PI-MM  Will have my nurse inform pt that lab test was normal.  She does not have inherited form of emphysema.  No change to current tx plan.

## 2015-04-16 ENCOUNTER — Telehealth: Payer: Self-pay | Admitting: Internal Medicine

## 2015-04-16 ENCOUNTER — Other Ambulatory Visit: Payer: Self-pay | Admitting: *Deleted

## 2015-04-16 ENCOUNTER — Encounter: Payer: Self-pay | Admitting: *Deleted

## 2015-04-16 NOTE — Telephone Encounter (Signed)
No - it is too early for a refill

## 2015-04-16 NOTE — Patient Outreach (Signed)
[facility]) Care Management   04/16/2015  Phyllis [name] Oct 05, 1955 [id number]  Phyllis [name] is an 59 y.o. female  Subjective:  Pt remains receptive to the [facility] services and verified consent has been signed. Pt discussed her ongoing medical issues and has agreed she need ongoing education concerning her COPD.  COPD: Pt not aware of the COPD action plan however aware of her regular inhaler and her emergency inhaler. Once educated on the COPD action plan pt able to verify she is in the GREEN zone and aware who to call if her symptoms get worse.  NON- ADHERENT WITH OXYGEN: Pt reports she was not wearing her home O2 upon RN's arrival today due to her concentrator extension cord did not come as far as the living room where the assessment was completed.  Pt was able to verify she can use her portable O2 while RN visits for continues use of her oxygen and applied while RN was in the home.  Pt verified she was reduced to 3 liters from 4 liters and she feels she is getting enough oxygen at the reduce liters. Pt denies any signs or symptoms of distress and able to perform her daily activities with no problems. Pt aware of the correct way to breath related to pulse-lip breathing and states he "feels better" with more oxygen.  EDEMA: Pt reports she has a history of swelling more in the summer and aware to elevate her lower legs to reduce ongoing swelling.   MEDICAL APPOINTMENTS: Pt reports she has attended all scheduled medical appointments with no delays and has transportation (drives herself but her husband remains available).    Objective:   Review of Systems  Constitutional: Negative.   HENT: Negative.   Eyes: Negative.   Respiratory: Negative.   Cardiovascular: Negative.   Gastrointestinal: Negative.   Genitourinary: Negative.   Musculoskeletal: Negative.   Skin: Negative.   Neurological: Negative.   Endo/Heme/Allergies: Negative.   Psychiatric/Behavioral: Negative.      Physical Exam  Constitutional: She is oriented to person, place, and time. She appears well-developed and well-nourished.  HENT:  Right Ear: External ear normal.  Left Ear: External ear normal.  Nose: Nose normal.  Neck: Normal range of motion.  Cardiovascular: Normal rate.   Respiratory: Effort normal and breath sounds normal.  GI: Soft. Bowel sounds are normal.  Musculoskeletal: Normal range of motion.  Neurological: She is alert and oriented to person, place, and time.  Skin: Skin is warm and dry.  Psychiatric: She has a normal mood and affect. Her behavior is normal. Judgment and thought content normal.    Current Medications:   Current Outpatient Prescriptions  Medication Sig Dispense Refill  . Albuterol Sulfate (PROAIR RESPICLICK) 154 (90 BASE) MCG/ACT AEPB Inhale 1 puff into the lungs 4 (four) times daily as needed (For shortness of breath.). 1 each 0  . ALPRAZolam (XANAX) 0.5 MG tablet Take 1 tablet (0.5 mg total) by mouth 2 (two) times daily. (Patient taking differently: Take 0.5 mg by mouth 3 (three) times daily as needed. ) 50 tablet 2  . amLODipine (NORVASC) 10 MG tablet Take 1 tablet (10 mg total) by mouth daily. 30 tablet 11  . aspirin EC 81 MG tablet Take 81 mg by mouth at bedtime.     [name] atorvastatin (LIPITOR) 40 MG tablet Take 1 tablet (40 mg total) by mouth daily at 6 PM. 30 tablet 11  . bisoprolol (ZEBETA) 5 MG tablet Take 1 tablet (5 mg  total) by mouth daily. 30 tablet 11  . gabapentin (NEURONTIN) 300 MG capsule Take 1 capsule (300 mg total) by mouth 2 (two) times daily. 60 capsule 0  . ibuprofen (ADVIL,MOTRIN) 200 MG tablet Take 200-400 mg by mouth every 6 (six) hours as needed (for pain).     [name] ipratropium-albuterol (DUONEB) 0.5-2.5 (3) MG/3ML SOLN Take 3 mLs by nebulization 3 (three) times daily. 360 mL 0  . mometasone-formoterol (DULERA) 100-5 MCG/ACT AERO Inhale 2 puffs into the lungs 2 (two) times daily. 13 g 0  . oxyCODONE (ROXICODONE) 15 MG immediate  release tablet Take 1 tablet (15 mg total) by mouth every 6 (six) hours as needed. For pain. 30 tablet 0  . potassium chloride SA (KLOR-CON M20) 20 MEQ tablet Take 1 tablet (20 mEq total) by mouth 2 (two) times daily. 60 tablet 0  . triamcinolone ointment (KENALOG) 0.1 % Apply 1 application topically 2 (two) times daily. 30 g 0  . sodium chloride (OCEAN) 0.65 % SOLN nasal spray Place 1 spray into both nostrils as needed for congestion. (Patient not taking: Reported on 04/16/2015) 480 mL 0   No current facility-administered medications for this visit.    Functional Status:   In your present state of health, do you have any difficulty performing the following activities: 04/16/2015 03/27/2015  Hearing? N N  Vision? N N  Difficulty concentrating or making decisions? N N  Walking or climbing stairs? N N  Dressing or bathing? N N  Doing errands, shopping? N N  Preparing Food and eating ? N -  Using the Toilet? N -  In the past six months, have you accidently leaked urine? N -  Do you have problems with loss of bowel control? N -  Managing your Medications? N -  Managing your Finances? N -  Housekeeping or managing your Housekeeping? N -    Fall/Depression Screening:    PHQ 2/9 Scores 04/16/2015 02/06/2015 09/30/2014  PHQ - 2 Score 0 0 0    Assessment:  Introduction to the [facility] program and consent  Case management related to COPD Home O2 usage related to COPD Bilateral edema related recent swelling Follow up on adherence related to all medical appointments  Plan: Will verify pt remains receptive to the [facility] program and services and verified a signed consent prior to proceeding with [facility] services. Physical assessment completed with no acute symptoms found today. Will verified pt's understanding of her recent hospital. Discussed COPD with education material and pt able to verbalized an understanding of this medical condition. Focus today was on pt's COPD as pt able to verify she remains in the  GREEN zone once educated on the COPD action plan.  Verified pt's awareness of the regular and emergency inhalers used and confirmed pt's use on her nebulizer. Also verified pt has not experienced any signs or symptoms of COPD distress since her discharge. Strongly encouraged pt to use her home O2 concentrator and portable oxygen at all times as recommended by her providers to prevent desaturation and/or possible injuries. Will verify pt's liters as last ordered by her providers (3 liters).  Will verify pt continues to elevate her bilateral legs to reduce and swelling and RN will continue to encouraged elevation to control ongoing swelling. Will also discuss compression stockings if needed in the future that will be presented to her provider if needed.  Will continue to verify pt is attending all scheduled appointments since her hospital discharge and will continue to encouraged adherence (verified  attendence). Generated a plan of care and set goals that will be discussed with pt in detail and how to obtain such goals in managing her care independently. Plan to follow up in few weeks with ongoing case management needs and re-evaluate plan of care and readdress pt's progress. No other request or inquires at this time as RN will provide a contact number if needed in the future prior to the next home visit. Will also verify pt's is aware of the 24 hour nurse's hotline via [facility] services if needed.   [name], RN Care Management Coordinator [place] [facility] Main Office [phone]

## 2015-04-16 NOTE — Telephone Encounter (Signed)
Called and spoke with pt. Reviewed results and recs. Pt voiced understanding and had no further questions.  

## 2015-04-16 NOTE — Telephone Encounter (Signed)
Patient is still in pain and is requesting a refill for oxyCODONE (ROXICODONE) 15 MG immediate release tablet [[id number]]

## 2015-04-17 NOTE — Telephone Encounter (Signed)
Patient notified per Dr. Ronnald Ramp too early to refill.

## 2015-04-21 ENCOUNTER — Other Ambulatory Visit: Payer: Self-pay | Admitting: *Deleted

## 2015-04-21 NOTE — Patient Outreach (Signed)
[facility]) Care Management  04/21/2015  KIERAN NACHTIGAL May 31, 1956 [id number]  3rd Transition of care contact  RN spoke with pt today and verified pt remains in the GREEN zone with her COPD. States her doctor's have informed her to use Ocean Spray if her nasals get overly dry from the oxygen usage. Pt states no coughing since she has quit smoking prior to her last hospitalization and and states se is using her home oxygen daily as recommended and "feels better". Pt reports swelling to her lower legs much improved. RN offered compressions if needed in the future to control ongoing swelling however elevating her legs would also assist in reducing her swelling.  Pt verified her next appointment on the 21st as RN strongly encouraged pt to attend or cancel in advance to avoid possible no show fees (pt aware there will be a $50 charge on her behalf if she does not call or show up for the appointment). Pt having problems with her pain prescription but has spoken with her doctor's office concerning this matter. No other inquires or request at this time. Reminded pt of the next home visit via [place] and verified pt has RN contact if any problems or issues arise.  Will continue ongoing transition of care contact next week.  [name], RN Care Management Coordinator [place] [facility] Main Office [phone]

## 2015-04-25 ENCOUNTER — Ambulatory Visit (INDEPENDENT_AMBULATORY_CARE_PROVIDER_SITE_OTHER): Payer: Medicare Other | Admitting: Pulmonary Disease

## 2015-04-25 DIAGNOSIS — R911 Solitary pulmonary nodule: Secondary | ICD-10-CM

## 2015-04-25 DIAGNOSIS — J9611 Chronic respiratory failure with hypoxia: Secondary | ICD-10-CM

## 2015-04-25 DIAGNOSIS — J432 Centrilobular emphysema: Secondary | ICD-10-CM | POA: Diagnosis not present

## 2015-04-25 DIAGNOSIS — R609 Edema, unspecified: Secondary | ICD-10-CM

## 2015-04-25 LAB — PULMONARY FUNCTION TEST
DL/VA % PRED: 35 %
DL/VA: 1.73 ml/min/mmHg/L
DLCO UNC % PRED: 22 %
DLCO unc: 5.5 ml/min/mmHg
FEF 25-75 Post: 0.42 L/sec
FEF 25-75 Pre: 0.31 L/sec
FEF2575-%Change-Post: 34 %
FEF2575-%Pred-Post: 20 %
FEF2575-%Pred-Pre: 15 %
FEV1-%CHANGE-POST: 17 %
FEV1-%PRED-POST: 44 %
FEV1-%PRED-PRE: 37 %
FEV1-Post: 0.92 L
FEV1-Pre: 0.78 L
FEV1FVC-%Change-Post: 9 %
FEV1FVC-%Pred-Pre: 57 %
FEV6-%CHANGE-POST: 11 %
FEV6-%Pred-Post: 70 %
FEV6-%Pred-Pre: 63 %
FEV6-Post: 1.81 L
FEV6-Pre: 1.63 L
FEV6FVC-%Change-Post: 3 %
FEV6FVC-%Pred-Post: 101 %
FEV6FVC-%Pred-Pre: 97 %
FVC-%Change-Post: 7 %
FVC-%Pred-Post: 69 %
FVC-%Pred-Pre: 64 %
FVC-Post: 1.85 L
FVC-Pre: 1.72 L
PRE FEV1/FVC RATIO: 45 %
PRE FEV6/FVC RATIO: 95 %
Post FEV1/FVC ratio: 50 %
Post FEV6/FVC ratio: 98 %
RV % PRED: 102 %
RV: 2.05 L
TLC % pred: 80 %
TLC: 4.05 L

## 2015-04-25 NOTE — Telephone Encounter (Signed)
Patient would like to know if she could have refill on the 25th?

## 2015-04-25 NOTE — Progress Notes (Signed)
PFT done today. 

## 2015-04-27 ENCOUNTER — Other Ambulatory Visit: Payer: Self-pay | Admitting: Internal Medicine

## 2015-04-27 DIAGNOSIS — M5416 Radiculopathy, lumbar region: Secondary | ICD-10-CM

## 2015-04-27 MED ORDER — OXYCODONE HCL 15 MG PO TABS: 15.0000 mg | ORAL_TABLET | Freq: Four times a day (QID) | ORAL | Status: DC | PRN

## 2015-04-27 NOTE — Telephone Encounter (Signed)
done

## 2015-04-28 ENCOUNTER — Other Ambulatory Visit: Payer: Self-pay | Admitting: *Deleted

## 2015-04-28 ENCOUNTER — Telehealth (HOSPITAL_COMMUNITY): Payer: Self-pay

## 2015-04-28 NOTE — Patient Outreach (Signed)
[facility]) Care Management  04/28/2015  MALERIE EAKINS [birth year] [id number]  4th Transition of care contact  RN spoke with pt today who indicates she continues to have ongoing pain however she is able to get her pain prescription filled today and on her way to the pharmacy. Pt reports she is in the GREEN zone today with no trouble breathing, good appetite, no coughing and able to sleep well with no problems. RN will follow up with pt on a home visit in August for ongoing [facility] services and provide pt printed material via EMMI and calendar tool for ongoing monitoring of her COPD. No inquires or request today as pt aware of how to reach this RN if issues arise. Note RN completed the initial home visit on 04/16/2015 (intial assessment completed).  [name], RN Care Management Coordinator [place] [facility] Main Office [phone]

## 2015-04-28 NOTE — Telephone Encounter (Signed)
I have called and left a message with [place] to inquire about participation in Pulmonary Rehab per Dr. Juanetta Gosling referral. Will send letter in mail and follow up.

## 2015-04-29 ENCOUNTER — Telehealth: Payer: Self-pay | Admitting: Pulmonary Disease

## 2015-04-29 NOTE — Telephone Encounter (Signed)
PFT 04/25/15 >> FEV1 0.92 (44%), FEV1% 50, TLC 4.05 (80%), DLCO 22%, no BD.  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  She should continue dulera and duoneb.

## 2015-05-01 DIAGNOSIS — J449 Chronic obstructive pulmonary disease, unspecified: Secondary | ICD-10-CM | POA: Diagnosis not present

## 2015-05-01 NOTE — Telephone Encounter (Signed)
Results have been explained to patient, pt expressed understanding. Patient states that her Ruthe Mannan is $69 - insurance does not cover this. Pt was able to get one free 30-day supply.  Requesting something different be called into pharmacy.  Please advise Dr Halford Chessman. Thanks.

## 2015-05-01 NOTE — Telephone Encounter (Signed)
Can you find out what alternative to dulera is covered by her plan >> options would be breo, advair, or symbicort.

## 2015-05-02 NOTE — Telephone Encounter (Signed)
Need to contact [place]  Member ID # [id number] Patient has a 30-day supply at home that she just received - does not need any meds at this time.  Will call to check on alternatives with [facility]

## 2015-05-06 ENCOUNTER — Telehealth: Payer: Self-pay

## 2015-05-06 ENCOUNTER — Encounter: Payer: Self-pay | Admitting: Internal Medicine

## 2015-05-06 ENCOUNTER — Ambulatory Visit (INDEPENDENT_AMBULATORY_CARE_PROVIDER_SITE_OTHER): Payer: Medicare Other | Admitting: Internal Medicine

## 2015-05-06 ENCOUNTER — Telehealth: Payer: Self-pay | Admitting: Internal Medicine

## 2015-05-06 VITALS — BP 140/80 | HR 103 | Temp 98.0°F | Resp 20 | Ht 64.0 in | Wt 152.0 lb

## 2015-05-06 DIAGNOSIS — Z1239 Encounter for other screening for malignant neoplasm of breast: Secondary | ICD-10-CM

## 2015-05-06 DIAGNOSIS — M5417 Radiculopathy, lumbosacral region: Secondary | ICD-10-CM

## 2015-05-06 DIAGNOSIS — I10 Essential (primary) hypertension: Secondary | ICD-10-CM

## 2015-05-06 DIAGNOSIS — M5416 Radiculopathy, lumbar region: Secondary | ICD-10-CM

## 2015-05-06 MED ORDER — OXYCODONE HCL 15 MG PO TABS: 15.0000 mg | ORAL_TABLET | Freq: Three times a day (TID) | ORAL | Status: DC | PRN

## 2015-05-06 MED ORDER — HYDROCHLOROTHIAZIDE 12.5 MG PO CAPS: 12.5000 mg | ORAL_CAPSULE | Freq: Every day | ORAL | Status: DC

## 2015-05-06 NOTE — Progress Notes (Signed)
Subjective:  Patient ID: Phyllis [name], female    DOB: 1956/01/03  Age: 59 y.o. MRN: [id number]  CC: Hypertension and Back Pain   HPI Phyllis [name] presents for follow-up on hypertension and chronic low back pain. She complains of intermittent low back pain that radiates into both lower extremities more on the right than the left. She is currently taking oxycodone 3 times a day and it offers pain relief. She also complains of lower leg and ankle edema today. This is been developing over the last 1-2 months.  Outpatient Prescriptions Prior to Visit  Medication Sig Dispense Refill  . Albuterol Sulfate (PROAIR RESPICLICK) 564 (90 BASE) MCG/ACT AEPB Inhale 1 puff into the lungs 4 (four) times daily as needed (For shortness of breath.). 1 each 0  . ALPRAZolam (XANAX) 0.5 MG tablet Take 1 tablet (0.5 mg total) by mouth 2 (two) times daily. (Patient taking differently: Take 0.5 mg by mouth 3 (three) times daily as needed. ) 50 tablet 2  . aspirin EC 81 MG tablet Take 81 mg by mouth at bedtime.     [name] atorvastatin (LIPITOR) 40 MG tablet Take 1 tablet (40 mg total) by mouth daily at 6 PM. 30 tablet 11  . bisoprolol (ZEBETA) 5 MG tablet Take 1 tablet (5 mg total) by mouth daily. 30 tablet 11  . gabapentin (NEURONTIN) 300 MG capsule Take 1 capsule (300 mg total) by mouth 2 (two) times daily. 60 capsule 0  . ipratropium-albuterol (DUONEB) 0.5-2.5 (3) MG/3ML SOLN Take 3 mLs by nebulization 3 (three) times daily. 360 mL 0  . mometasone-formoterol (DULERA) 100-5 MCG/ACT AERO Inhale 2 puffs into the lungs 2 (two) times daily. 13 g 0  . potassium chloride SA (KLOR-CON M20) 20 MEQ tablet Take 1 tablet (20 mEq total) by mouth 2 (two) times daily. 60 tablet 0  . sodium chloride (OCEAN) 0.65 % SOLN nasal spray Place 1 spray into both nostrils as needed for congestion. 480 mL 0  . triamcinolone ointment (KENALOG) 0.1 % Apply 1 application topically 2 (two) times daily. 30 g 0  . amLODipine (NORVASC) 10 MG  tablet Take 1 tablet (10 mg total) by mouth daily. 30 tablet 11  . ibuprofen (ADVIL,MOTRIN) 200 MG tablet Take 200-400 mg by mouth every 6 (six) hours as needed (for pain).     [name] oxyCODONE (ROXICODONE) 15 MG immediate release tablet Take 1 tablet (15 mg total) by mouth every 6 (six) hours as needed. For pain. 30 tablet 0   No facility-administered medications prior to visit.    ROS Review of Systems  Constitutional: Negative.  Negative for fever, chills, diaphoresis, appetite change and fatigue.  HENT: Negative.  Negative for trouble swallowing and voice change.   Eyes: Negative.   Respiratory: Positive for shortness of breath. Negative for cough, choking, chest tightness, wheezing and stridor.   Cardiovascular: Positive for leg swelling. Negative for chest pain and palpitations.  Gastrointestinal: Negative.  Negative for nausea, vomiting, abdominal pain, diarrhea, constipation and blood in stool.  Endocrine: Negative.   Genitourinary: Negative.   Musculoskeletal: Positive for back pain. Negative for myalgias, joint swelling, arthralgias, gait problem, neck pain and neck stiffness.  Skin: Negative.   Allergic/Immunologic: Negative.   Neurological: Negative.   Hematological: Negative.  Negative for adenopathy. Does not bruise/bleed easily.    Objective:  BP 140/80 mmHg  Pulse 103  Temp(Src) 98 F (36.7 C) (Oral)  Resp 20  Ht _0  (1.626 m)  Wt 152 lb (  68.947 kg)  BMI 26.08 kg/m2  SpO2 90%  LMP 10/05/1999  BP Readings from Last 3 Encounters:  05/06/15 140/80  04/16/15 116/64  04/09/15 136/70    Wt Readings from Last 3 Encounters:  05/06/15 152 lb (68.947 kg)  04/16/15 147 lb (66.679 kg)  04/09/15 156 lb (70.761 kg)    Physical Exam  Constitutional: She is oriented to person, place, and time.  Non-toxic appearance. She does not have a sickly appearance. She does not appear ill. No distress.  HENT:  Mouth/Throat: Oropharynx is clear and moist. No oropharyngeal exudate.   Eyes: Conjunctivae are normal. Right eye exhibits no discharge. Left eye exhibits no discharge. No scleral icterus.  Neck: Normal range of motion. Neck supple. No JVD present. No tracheal deviation present. No thyromegaly present.  Cardiovascular: Normal rate, regular rhythm, normal heart sounds and intact distal pulses.  Exam reveals no gallop and no friction rub.   No murmur heard. Pulmonary/Chest: Effort normal. No accessory muscle usage or stridor. No tachypnea. No respiratory distress. She has no decreased breath sounds. She has no wheezes. She has rhonchi in the right middle field and the left middle field. She has no rales. She exhibits no tenderness.  There are diffuse, bilateral, late expiratory rhonchi.  Abdominal: Soft. Bowel sounds are normal. She exhibits no distension and no mass. There is no tenderness. There is no rebound and no guarding.  Musculoskeletal: She exhibits edema (2+ pitting edema over both ankles). She exhibits no tenderness.  Lymphadenopathy:    She has no cervical adenopathy.  Neurological: She is alert and oriented to person, place, and time. She has normal reflexes. She displays normal reflexes. No cranial nerve deficit. She exhibits normal muscle tone. Coordination normal.  Negative straight leg raise in bilateral lower extremity.  Skin: Skin is warm and dry. No rash noted. She is not diaphoretic. No erythema. No pallor.  Psychiatric: Her behavior is normal. Judgment and thought content normal.  Vitals reviewed.   Lab Results  Component Value Date   WBC 2.7* 03/31/2015   HGB 12.8 03/31/2015   HCT 41.0 03/31/2015   PLT 81* 03/31/2015   GLUCOSE 125* 04/01/2015   CHOL 63 03/28/2015   TRIG 44 03/28/2015   HDL 25* 03/28/2015   LDLCALC 29 03/28/2015   ALT 17 03/27/2015   AST 39 03/27/2015   NA 136 04/01/2015   K 3.7 04/01/2015   CL 94* 04/01/2015   CREATININE 0.68 04/01/2015   BUN 8 04/01/2015   CO2 35* 04/01/2015   TSH 2.171 03/28/2015   INR 1.64*  03/27/2015   HGBA1C 5.7 07/27/2013    No results found.  Assessment & Plan:   Phyllis [name] was seen today for hypertension and back pain.  Diagnoses and all orders for this visit:  Screening for breast cancer Orders: -     MM Digital Screening; Future  Right lumbar radiculitis- we'll continue oxycodone as needed for pain. Orders: -     Discontinue: oxyCODONE (ROXICODONE) 15 MG immediate release tablet; Take 1 tablet (15 mg total) by mouth every 8 (eight) hours as needed. For pain. -     oxyCODONE (ROXICODONE) 15 MG immediate release tablet; Take 1 tablet (15 mg total) by mouth every 8 (eight) hours as needed. For pain.  Essential hypertension, benign- she has peripheral pitting edema so will stop the calcium channel blocker. She will continue the beta blocker and I will add hydrochlorothiazide for blood pressure control. Orders: -     hydrochlorothiazide (MICROZIDE) 12.5  MG capsule; Take 1 capsule (12.5 mg total) by mouth daily.   I have discontinued Phyllis [name]'s ibuprofen and amLODipine. I am also having her start on hydrochlorothiazide. Additionally, I am having her maintain her aspirin EC, Albuterol Sulfate, gabapentin, potassium chloride SA, triamcinolone ointment, ipratropium-albuterol, mometasone-formoterol, sodium chloride, ALPRAZolam, bisoprolol, atorvastatin, and oxyCODONE.  Meds ordered this encounter  Medications  . DISCONTD: oxyCODONE (ROXICODONE) 15 MG immediate release tablet    Sig: Take 1 tablet (15 mg total) by mouth every 8 (eight) hours as needed. For pain.    Dispense:  90 tablet    Refill:  0    Fill on or after 05/06/15  . oxyCODONE (ROXICODONE) 15 MG immediate release tablet    Sig: Take 1 tablet (15 mg total) by mouth every 8 (eight) hours as needed. For pain.    Dispense:  90 tablet    Refill:  0    Fill on or after 06/06/15  . hydrochlorothiazide (MICROZIDE) 12.5 MG capsule    Sig: Take 1 capsule (12.5 mg total) by mouth daily.    Dispense:  90 capsule      Refill:  1     Follow-up: Return in about 2 months (around 07/06/2015).  Phyllis Calico, MD

## 2015-05-06 NOTE — Telephone Encounter (Signed)
Spoke with pt about mammogram during OV today. Pt stated she would like one scheduled, an order has been put in. Pt is aware someone will be contacting her for an apt.

## 2015-05-06 NOTE — Progress Notes (Signed)
Pre visit review using our clinic review tool, if applicable. No additional management support is needed unless otherwise documented below in the visit note.

## 2015-05-06 NOTE — Patient Instructions (Signed)

## 2015-05-06 NOTE — Telephone Encounter (Signed)
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 She verified that the patient does have an active dx of CHF. She is requesting to speak with you to get a LEF BP/ HR fraction. Please give her a call.

## 2015-05-07 NOTE — Telephone Encounter (Signed)
Returned call back to [facility] Selinda Eon), per voice prompt office is closed. Will try back later.

## 2015-05-07 NOTE — Telephone Encounter (Signed)
Spoke with pt, aware of the rx change.   Pt has requested that we not yet refill this as she still has about 3 weeks left on her current inhaler and does not want this rx to expire in the meantime.   Pt states she will call us when her dulera gets low and we can send in refill then.   Nothing further needed at this time.

## 2015-05-07 NOTE — Telephone Encounter (Signed)
Can change to breo 100 one puff daily.

## 2015-05-07 NOTE — Telephone Encounter (Signed)
Called [employer] at number listed below Advised to contact [facility] @ [phone] Called [facility]: Advair 250 - Tier 3 - $45 Breo 100 - Tier 3 - $45 Symbicort 160 - Tier 3 - $45 Dulera  Is listed as a Tier 4 - $95 or higher  Please advise Dr Halford Chessman. Thanks

## 2015-05-13 ENCOUNTER — Other Ambulatory Visit: Payer: Self-pay | Admitting: *Deleted

## 2015-05-13 NOTE — Patient Outreach (Signed)
[facility]) Care Management   05/13/2015  Phyllis [name] [birth year] [id number]  Phyllis [name] is an 59 y.o. female  Subjective:  COPD: Pt reports she is doing well with her breathing and recently had an outings where she was able to obtain a portable light weight tank from [place] in order for her to attend a family reunion. Pt very pleased with the use of the tank and returned it as contracted.  Pt able to review the COPD action plan and verified she is in the [place] today with no encountered issues. Pt states she remains on 3 liters with no coughing and able to maintain good appetite.  HOME O2: Pt reports since RN last visit she has been wearing her home O2 continuously with no distressful breathing and able to do more throughout the day. Pt verified she remains on 3 liters. Pt verified she is able to clean the filter on her home concentrator every week with no problems.  MEDICATION: Pt reports swelling to her lower legs where she contacted Dr. Ronnald Ramp who changed one of her medications on her BP to HCTZ that she started on 8/3 with no problems and enough refills and supplies. SWELLING: Pt states her lower swelling has reduce "alot" but some swelling remains but improving. Pt aware of compression stockings however prefers not to wear them but will consider if needed in the future. Pt will elevated her legs throughout the day to reduce ongoing swelling.   Objective:   Review of Systems  All other systems reviewed and are negative.   Physical Exam  Constitutional: She is oriented to person, place, and time. She appears well-developed and well-nourished.  HENT:  Right Ear: External ear normal.  Left Ear: External ear normal.  Nose: Nose normal.  Eyes: EOM are normal.  Neck: Normal range of motion.  Cardiovascular: Regular rhythm.   Respiratory: Effort normal. She has rales.  Slight rales upon expiration (history asthma)  GI: Soft. Bowel sounds are normal.   Musculoskeletal: Normal range of motion.  Neurological: She is alert and oriented to person, place, and time.  Skin: Skin is warm and dry.  Psychiatric: She has a normal mood and affect. Her behavior is normal. Judgment and thought content normal.    Current Medications:   Current Outpatient Prescriptions  Medication Sig Dispense Refill  . Albuterol Sulfate (PROAIR RESPICLICK) 250 (90 BASE) MCG/ACT AEPB Inhale 1 puff into the lungs 4 (four) times daily as needed (For shortness of breath.). 1 each 0  . ALPRAZolam (XANAX) 0.5 MG tablet Take 1 tablet (0.5 mg total) by mouth 2 (two) times daily. (Patient taking differently: Take 0.5 mg by mouth 3 (three) times daily as needed. ) 50 tablet 2  . aspirin EC 81 MG tablet Take 81 mg by mouth at bedtime.     [name] atorvastatin (LIPITOR) 40 MG tablet Take 1 tablet (40 mg total) by mouth daily at 6 PM. 30 tablet 11  . bisoprolol (ZEBETA) 5 MG tablet Take 1 tablet (5 mg total) by mouth daily. 30 tablet 11  . gabapentin (NEURONTIN) 300 MG capsule Take 1 capsule (300 mg total) by mouth 2 (two) times daily. 60 capsule 0  . hydrochlorothiazide (MICROZIDE) 12.5 MG capsule Take 1 capsule (12.5 mg total) by mouth daily. 90 capsule 1  . ipratropium-albuterol (DUONEB) 0.5-2.5 (3) MG/3ML SOLN Take 3 mLs by nebulization 3 (three) times daily. 360 mL 0  . mometasone-formoterol (DULERA) 100-5 MCG/ACT AERO Inhale 2 puffs  into the lungs 2 (two) times daily. 13 g 0  . oxyCODONE (ROXICODONE) 15 MG immediate release tablet Take 1 tablet (15 mg total) by mouth every 8 (eight) hours as needed. For pain. 90 tablet 0  . potassium chloride SA (KLOR-CON M20) 20 MEQ tablet Take 1 tablet (20 mEq total) by mouth 2 (two) times daily. 60 tablet 0  . sodium chloride (OCEAN) 0.65 % SOLN nasal spray Place 1 spray into both nostrils as needed for congestion. 480 mL 0  . triamcinolone ointment (KENALOG) 0.1 % Apply 1 application topically 2 (two) times daily. 30 g 0   No current  facility-administered medications for this visit.    Functional Status:   In your present state of health, do you have any difficulty performing the following activities: 04/16/2015 03/27/2015  Hearing? N N  Vision? N N  Difficulty concentrating or making decisions? N N  Walking or climbing stairs? N N  Dressing or bathing? N N  Doing errands, shopping? N N  Preparing Food and eating ? N -  Using the Toilet? N -  In the past six months, have you accidently leaked urine? N -  Do you have problems with loss of bowel control? N -  Managing your Medications? N -  Managing your Finances? N -  Housekeeping or managing your Housekeeping? N -    Fall/Depression Screening:    PHQ 2/9 Scores 04/16/2015 02/06/2015 09/30/2014  PHQ - 2 Score 0 0 0    Assessment:   Ongoing case management related to COPD Follow up on Home O2 Education on new medications  Bilateral swelling    Plan:  Physical assessment completed with no acute at this time as pt continues to manager her care independently. Will reiterated on the signs and symptoms as pt receptive to the teach-back method on verifying she is in the GREEN zone with no acute events. Discussed the importance of ongoing daily monitoring and continued to encouraged adherence with daily views of the COPD action plan and what to do if acute symptoms should occur.   Verified pt continues to use her home O2 continuously set on 3 liters as recommended with no additional problems. Note pt was not wearing her home O2 daily prior to [facility] involved. Pt educated on the importance and has been adherence with daily usage. Pt inquired on resources for obtaining a pulse oximeter as RN provided and encouraged such device especially if she is active with her outings. Demonstrated on how to use such devices once obtained.  Verified [facility] will be maintaining her home concentrator. Discussed her new medications and the propose of HCTZ with no encountered problems  since prescribed 05/07/15 by Dr. Ronnald Ramp.  Pt has been taken this medication very early (0600) however discussed pt taking the medication later in the morning prior to 1100 due to her BP elevation around the 1500 hours 148/72 as pt remain asymptomatic.  Discussed elevating her lower legs and possibly wearing compression stocking if needed in the future. Verified edema is reducing with the new medication however if compression stockings are needed pt aware where to obtain or inquire further with this RN.  Plan of care discussed along with all goals met and developed for ongoing self management of care concerning her COPD. Praise pt on her adherence and continued to encouraged her on the new goals discussed today (pt receptive). No other inquires or request at this time as pt pleased with her ongoing progress.   Lattie Haw  Zigmund Daniel, RN Care Management Coordinator [facility] Main Office [phone]

## 2015-06-01 DIAGNOSIS — J449 Chronic obstructive pulmonary disease, unspecified: Secondary | ICD-10-CM | POA: Diagnosis not present

## 2015-06-02 ENCOUNTER — Telehealth (HOSPITAL_COMMUNITY): Payer: Self-pay

## 2015-06-02 NOTE — Telephone Encounter (Signed)
Called patient to discuss Pulmonary Rehab referral.  Patient states that she does not want to come to rehab because she "just doesn't want to get out" if she doesn't have to. Patient is encouraged to contact us in the future if she changes her mind.

## 2015-06-04 ENCOUNTER — Telehealth: Payer: Self-pay | Admitting: *Deleted

## 2015-06-04 DIAGNOSIS — M5416 Radiculopathy, lumbar region: Secondary | ICD-10-CM

## 2015-06-04 MED ORDER — OXYCODONE HCL 15 MG PO TABS: 15.0000 mg | ORAL_TABLET | Freq: Three times a day (TID) | ORAL | Status: DC | PRN

## 2015-06-04 NOTE — Telephone Encounter (Signed)
Left msg on triage requesting refill on her oxycodone. This month had 31 days and she only have 1 pill left...Phyllis [name]

## 2015-06-04 NOTE — Telephone Encounter (Signed)
done

## 2015-06-04 NOTE — Telephone Encounter (Signed)
Pt informed at front ready for pick up

## 2015-06-05 ENCOUNTER — Telehealth: Payer: Self-pay | Admitting: Pulmonary Disease

## 2015-06-05 MED ORDER — FLUTICASONE FUROATE-VILANTEROL 100-25 MCG/INH IN AEPB: 1.0000 | INHALATION_SPRAY | Freq: Every day | RESPIRATORY_TRACT | Status: DC

## 2015-06-05 NOTE — Telephone Encounter (Signed)
Per 04/30/15 phone note: Chesley Mires, MD at 05/07/2015 3:28 PM     Status: Signed       Expand All Collapse All   Can change to breo 100 one puff daily.      --  Called pt and she is needing her breo sent in now that she is done with her dulera.  Rx sent in. Nothing further needed

## 2015-06-10 ENCOUNTER — Ambulatory Visit
Admission: RE | Admit: 2015-06-10 | Discharge: 2015-06-10 | Disposition: A | Discharge status: Home / Self Care | Payer: Medicare Other | Source: Ambulatory Visit | Attending: Internal Medicine | Admitting: Internal Medicine

## 2015-06-10 DIAGNOSIS — Z1231 Encounter for screening mammogram for malignant neoplasm of breast: Secondary | ICD-10-CM | POA: Diagnosis not present

## 2015-06-10 DIAGNOSIS — Z1239 Encounter for other screening for malignant neoplasm of breast: Secondary | ICD-10-CM

## 2015-06-10 LAB — HM MAMMOGRAPHY: HM Mammogram: ABNORMAL

## 2015-06-10 NOTE — Addendum Note (Signed)
Addended by: [name] on: 06/10/2015 04:41 PM   Modules accepted: Miquel Dunn

## 2015-06-11 ENCOUNTER — Other Ambulatory Visit: Payer: Self-pay | Admitting: Internal Medicine

## 2015-06-11 DIAGNOSIS — R928 Other abnormal and inconclusive findings on diagnostic imaging of breast: Secondary | ICD-10-CM

## 2015-06-13 ENCOUNTER — Other Ambulatory Visit: Payer: Self-pay | Admitting: Internal Medicine

## 2015-06-13 ENCOUNTER — Other Ambulatory Visit: Payer: Self-pay

## 2015-06-13 DIAGNOSIS — R928 Other abnormal and inconclusive findings on diagnostic imaging of breast: Secondary | ICD-10-CM

## 2015-06-17 ENCOUNTER — Ambulatory Visit
Admission: RE | Admit: 2015-06-17 | Discharge: 2015-06-17 | Disposition: A | Discharge status: Home / Self Care | Payer: Medicare Other | Source: Ambulatory Visit | Attending: Internal Medicine | Admitting: Internal Medicine

## 2015-06-17 ENCOUNTER — Other Ambulatory Visit: Payer: Self-pay | Admitting: Internal Medicine

## 2015-06-17 DIAGNOSIS — R928 Other abnormal and inconclusive findings on diagnostic imaging of breast: Secondary | ICD-10-CM

## 2015-06-17 LAB — HM MAMMOGRAPHY: HM Mammogram: ABNORMAL

## 2015-06-17 NOTE — Addendum Note (Signed)
Addended by: [name] on: 06/17/2015 05:44 PM   Modules accepted: Miquel Dunn

## 2015-06-18 ENCOUNTER — Other Ambulatory Visit: Payer: Self-pay | Admitting: Internal Medicine

## 2015-06-18 ENCOUNTER — Telehealth: Payer: Self-pay | Admitting: *Deleted

## 2015-06-18 ENCOUNTER — Other Ambulatory Visit: Payer: Self-pay | Admitting: *Deleted

## 2015-06-18 DIAGNOSIS — IMO0001 Reserved for inherently not codable concepts without codable children: Secondary | ICD-10-CM

## 2015-06-18 DIAGNOSIS — I10 Essential (primary) hypertension: Secondary | ICD-10-CM

## 2015-06-18 MED ORDER — NEBIVOLOL HCL 10 MG PO TABS: 10.0000 mg | ORAL_TABLET | Freq: Every day | ORAL | Status: DC

## 2015-06-18 MED ORDER — MOMETASONE FURO-FORMOTEROL FUM 100-5 MCG/ACT IN AERO: 2.0000 | INHALATION_SPRAY | Freq: Two times a day (BID) | RESPIRATORY_TRACT | Status: DC

## 2015-06-18 MED ORDER — UMECLIDINIUM BROMIDE 62.5 MCG/INH IN AEPB: 1.0000 | INHALATION_SPRAY | Freq: Every day | RESPIRATORY_TRACT | Status: DC

## 2015-06-18 NOTE — Telephone Encounter (Signed)
Start incruse

## 2015-06-18 NOTE — Telephone Encounter (Signed)
Notified pt with md response. Pt states the nurse that came to see her recommended mucinex. She is on a fix income and can't afford these inhalers. She is currently taking the [facility]. Inform pt if she want to hold off on the Incruse, and start taking the mucinex and continue her breathing treatment that would be ok too long as she is not running a fever or have any color to the congestion. She states she want to try that first, and will f/u with md on 06/30/15...Johny Chess

## 2015-06-18 NOTE — Telephone Encounter (Signed)
Meds were sent to her pharmacy

## 2015-06-18 NOTE — Patient Outreach (Signed)
[facility]) Care Management   06/18/2015  Phyllis [name] Aug 04, 1956 [id number]  AEMILIA [name] is an 59 y.o. female  Subjective:  COPD: Will verified pt remains in the GREEN zone on the COPD action plan with no acute symptoms. Pt states she has been wearing her oxygen everyday as recommended and ordered by her doctor and states she "feel better". Denies any distressful breathing and states she had a change in her nebulizer (Dulera to [place]) due to the expense of the medication. HOME: Pt states she remains on 3 liters and continues to breath well with no issues reported today. MEDICATION: Pt states she is taking BREO now with no major issues and this medication replaced her recent medication of Dulera.  PRODUCTIVE COUGH: Pt states she has a productive cough with clear sputum and denies any fevers, chills or other related symptoms. States she is not taking any OTC medicine at this time but has an appointment with Dr. Ronnald Ramp later this month (9/26). HTN: Pt states she is taking her medication mid morning upon arriving instead of the earlier morning to allow the medicine to work throughout the day however thinks she continues to be high on her readings but does not have a BP cuff for home monitoring. Denies any hypertension symptoms with no headaches or vision issues but remains concerned.  Objective:   Review of Systems  Constitutional: Negative.   HENT: Negative.   Eyes: Negative.   Respiratory: Negative.   Cardiovascular: Negative.   Gastrointestinal: Negative.   Genitourinary: Negative.   Musculoskeletal: Negative.   Skin: Negative.   Neurological: Negative.   Endo/Heme/Allergies: Negative.   Psychiatric/Behavioral: Negative.     Physical Exam  Constitutional: She is oriented to person, place, and time. She appears well-developed and well-nourished.  HENT:  Right Ear: External ear normal.  Left Ear: External ear normal.  Nose: Nose normal.  Eyes: EOM are  normal.  Neck: Normal range of motion.  Cardiovascular: Regular rhythm.   Respiratory: Effort normal and breath sounds normal.  GI: Soft. Bowel sounds are normal.  Musculoskeletal: Normal range of motion.  Neurological: She is alert and oriented to person, place, and time.  Skin: Skin is warm and dry.  Psychiatric: She has a normal mood and affect. Her behavior is normal. Judgment and thought content normal.    Current Medications:   Current Outpatient Prescriptions  Medication Sig Dispense Refill  . Albuterol Sulfate (PROAIR RESPICLICK) 790 (90 BASE) MCG/ACT AEPB Inhale 1 puff into the lungs 4 (four) times daily as needed (For shortness of breath.). 1 each 0  . ALPRAZolam (XANAX) 0.5 MG tablet Take 1 tablet (0.5 mg total) by mouth 2 (two) times daily. (Patient taking differently: Take 0.5 mg by mouth 3 (three) times daily as needed. ) 50 tablet 2  . aspirin EC 81 MG tablet Take 81 mg by mouth at bedtime.     [name] atorvastatin (LIPITOR) 40 MG tablet Take 1 tablet (40 mg total) by mouth daily at 6 PM. 30 tablet 11  . bisoprolol (ZEBETA) 5 MG tablet Take 1 tablet (5 mg total) by mouth daily. 30 tablet 11  . Fluticasone Furoate-Vilanterol 100-25 MCG/INH AEPB Inhale 1 puff into the lungs daily. 60 each 1  . gabapentin (NEURONTIN) 300 MG capsule Take 1 capsule (300 mg total) by mouth 2 (two) times daily. 60 capsule 0  . hydrochlorothiazide (MICROZIDE) 12.5 MG capsule Take 1 capsule (12.5 mg total) by mouth daily. 90 capsule 1  . ipratropium-albuterol (  DUONEB) 0.5-2.5 (3) MG/3ML SOLN Take 3 mLs by nebulization 3 (three) times daily. 360 mL 0  . oxyCODONE (ROXICODONE) 15 MG immediate release tablet Take 1 tablet (15 mg total) by mouth every 8 (eight) hours as needed. For pain. 90 tablet 0  . potassium chloride SA (KLOR-CON M20) 20 MEQ tablet Take 1 tablet (20 mEq total) by mouth 2 (two) times daily. 60 tablet 0  . sodium chloride (OCEAN) 0.65 % SOLN nasal spray Place 1 spray into both nostrils as  needed for congestion. 480 mL 0  . triamcinolone ointment (KENALOG) 0.1 % Apply 1 application topically 2 (two) times daily. 30 g 0  . mometasone-formoterol (DULERA) 100-5 MCG/ACT AERO Inhale 2 puffs into the lungs 2 (two) times daily. (Patient not taking: Reported on 06/18/2015) 13 g 0   No current facility-administered medications for this visit.    Functional Status:   In your present state of health, do you have any difficulty performing the following activities: 04/16/2015 03/27/2015  Hearing? N N  Vision? N N  Difficulty concentrating or making decisions? N N  Walking or climbing stairs? N N  Dressing or bathing? N N  Doing errands, shopping? N N  Preparing Food and eating ? N -  Using the Toilet? N -  In the past six months, have you accidently leaked urine? N -  Do you have problems with loss of bowel control? N -  Managing your Medications? N -  Managing your Finances? N -  Housekeeping or managing your Housekeeping? N -   Filed Vitals:   06/18/15 1354  BP: 178/80  Pulse: 74  Resp: 20    Fall/Depression Screening:    PHQ 2/9 Scores 04/16/2015 02/06/2015 09/30/2014  PHQ - 2 Score 0 0 0    Assessment:   Ongoing case management related to COPD Home Oxygen Medication education related to the use of BREO Potential infection related to a productive cough HTN related to elevated BP   Plan:  Physical assessment completed with no acute issues however pt with a productive cough that RN will intervene. Will verified pt remains in the GREEN zone relate to her COPD with no acute issues presented. Verifies pt continues to use her new prescribed BREO along with her other prescribed medications with no acute issues.  Verified pt is using her home oxygen at all times to avoid SOB or respiratory distress or COPD exacerbation episodes. Will continue to encouraged daily use as recommended by her provider.  Educated pt on the new medication BREO and stress the importance of washing out  her mouth with each use to avoid possible infection (yeast). Pt verbalized an understanding and will comply with the instructions. Discussed OTC Mucinex to assist with pt's thick secretion and productive cough. Verified pt's current secretions with her productive cough is clear in coloration however pt is aware if discoloration turns yellow, brown, green or red to seek medical attention soon.  Verified no fevers, chills or GI issues have been encountered. Will contact pt's primary provider concerning pt's bilateral rhonchi to lower lobes for possible interventions.  Will discuss possible events of changes that may have effected pt's BP due to her elevation today and recent medication changes related. Will contact Dr. Ronnald Ramp with pt's readings today for possible intervention.  Verified pt has taken all her daily medications with no delays and there has been no evidence of hypertension symptoms.  Discussed the plan of care and update care plan accordingly to allow pt adherence.  Will follow up and re-evaluate next month on pt's progress.    Addendum:  1. Primary office called Dr. Ronnald Ramp spoke with Lorre Nick concerning the above thick secretions to bilateral lower lobes with clear secretions and elevated BP reading for today at 178/80 as pt remains asymptomatic.  Currently pending intervention directly to pt or via this RN case Freight forwarder.   [name], RN Care Management Coordinator [facility] Main Office [phone]

## 2015-06-18 NOTE — Telephone Encounter (Signed)
Called pt gave her md response. Pt states her insurance want cover the [facility], so her pulmonologist rx her [place] [facility] which she has been taking. Is there anything else she can take to help with cough...Johny Chess

## 2015-06-18 NOTE — Telephone Encounter (Signed)
Receive call from [facility]) want to report to MD pt BP has been creeping up. Check BP 178/80 she states she feel fine, Pt also has some bilateral bronci lower lobe. Pt has appt schedule for 06/30/15, but wanted to see if md want to rx something now. Nurse states she has also place notes in epic if md want to see...Phyllis [name]

## 2015-06-24 ENCOUNTER — Ambulatory Visit
Admission: RE | Admit: 2015-06-24 | Discharge: 2015-06-24 | Disposition: A | Discharge status: Home / Self Care | Payer: Medicare Other | Source: Ambulatory Visit | Attending: Internal Medicine | Admitting: Internal Medicine

## 2015-06-24 ENCOUNTER — Other Ambulatory Visit: Payer: Self-pay | Admitting: Internal Medicine

## 2015-06-24 DIAGNOSIS — D241 Benign neoplasm of right breast: Secondary | ICD-10-CM | POA: Diagnosis not present

## 2015-06-24 DIAGNOSIS — N63 Unspecified lump in breast: Secondary | ICD-10-CM | POA: Diagnosis not present

## 2015-06-24 DIAGNOSIS — R928 Other abnormal and inconclusive findings on diagnostic imaging of breast: Secondary | ICD-10-CM

## 2015-06-24 LAB — HM MAMMOGRAPHY: HM Mammogram: ABNORMAL

## 2015-06-24 NOTE — Addendum Note (Signed)
Addended by: [name] on: 06/24/2015 03:36 PM   Modules accepted: Miquel Dunn

## 2015-06-30 ENCOUNTER — Other Ambulatory Visit (INDEPENDENT_AMBULATORY_CARE_PROVIDER_SITE_OTHER): Payer: Medicare Other

## 2015-06-30 ENCOUNTER — Ambulatory Visit (INDEPENDENT_AMBULATORY_CARE_PROVIDER_SITE_OTHER): Payer: Medicare Other | Admitting: Internal Medicine

## 2015-06-30 ENCOUNTER — Encounter: Payer: Self-pay | Admitting: Internal Medicine

## 2015-06-30 VITALS — BP 170/92 | HR 103 | Temp 98.1°F | Resp 16 | Ht 64.0 in | Wt 134.0 lb

## 2015-06-30 DIAGNOSIS — I10 Essential (primary) hypertension: Secondary | ICD-10-CM

## 2015-06-30 DIAGNOSIS — R739 Hyperglycemia, unspecified: Secondary | ICD-10-CM | POA: Diagnosis not present

## 2015-06-30 DIAGNOSIS — M5417 Radiculopathy, lumbosacral region: Secondary | ICD-10-CM

## 2015-06-30 DIAGNOSIS — F418 Other specified anxiety disorders: Secondary | ICD-10-CM | POA: Diagnosis not present

## 2015-06-30 DIAGNOSIS — E038 Other specified hypothyroidism: Secondary | ICD-10-CM

## 2015-06-30 DIAGNOSIS — M5416 Radiculopathy, lumbar region: Secondary | ICD-10-CM

## 2015-06-30 LAB — HEMOGLOBIN A1C: HEMOGLOBIN A1C: 6.5 % (ref 4.6–6.5)

## 2015-06-30 LAB — BASIC METABOLIC PANEL
BUN: 7 mg/dL (ref 6–23)
CALCIUM: 9.8 mg/dL (ref 8.4–10.5)
CO2: 29 meq/L (ref 19–32)
CREATININE: 0.89 mg/dL (ref 0.40–1.20)
Chloride: 101 mEq/L (ref 96–112)
GFR: 82.94 mL/min (ref >60.00)
Glucose, Bld: 105 mg/dL — ABNORMAL HIGH (ref 70–99)
Potassium: 4.1 mEq/L (ref 3.5–5.1)
Sodium: 138 mEq/L (ref 135–145)

## 2015-06-30 LAB — TSH: TSH: 1.22 u[IU]/mL (ref 0.35–4.50)

## 2015-06-30 MED ORDER — OXYCODONE HCL 15 MG PO TABS: 15.0000 mg | ORAL_TABLET | Freq: Three times a day (TID) | ORAL | Status: DC | PRN

## 2015-06-30 MED ORDER — CARVEDILOL 6.25 MG PO TABS: 6.2500 mg | ORAL_TABLET | Freq: Two times a day (BID) | ORAL | Status: DC

## 2015-06-30 MED ORDER — ALPRAZOLAM 0.5 MG PO TABS: 0.5000 mg | ORAL_TABLET | Freq: Three times a day (TID) | ORAL | Status: DC | PRN

## 2015-06-30 NOTE — Progress Notes (Signed)
Pre visit review using our clinic review tool, if applicable. No additional management support is needed unless otherwise documented below in the visit note.

## 2015-06-30 NOTE — Patient Instructions (Signed)
Hypertension Hypertension, commonly called high blood pressure, is when the force of blood pumping through your arteries is too strong. Your arteries are the blood vessels that carry blood from your heart throughout your body. A blood pressure reading consists of a higher number over a lower number, such as 110/72. The higher number (systolic) is the pressure inside your arteries when your heart pumps. The lower number (diastolic) is the pressure inside your arteries when your heart relaxes. Ideally you want your blood pressure below 120/80. Hypertension forces your heart to work harder to pump blood. Your arteries may become narrow or stiff. Having hypertension puts you at risk for heart disease, stroke, and other problems.  RISK FACTORS Some risk factors for high blood pressure are controllable. Others are not.  Risk factors you cannot control include:   Race. You may be at higher risk if you are African American.  Age. Risk increases with age.  Gender. Men are at higher risk than women before age 45 years. After age 65, women are at higher risk than men. Risk factors you can control include:  Not getting enough exercise or physical activity.  Being overweight.  Getting too much fat, sugar, calories, or salt in your diet.  Drinking too much alcohol. SIGNS AND SYMPTOMS Hypertension does not usually cause signs or symptoms. Extremely high blood pressure (hypertensive crisis) may cause headache, anxiety, shortness of breath, and nosebleed. DIAGNOSIS  To check if you have hypertension, your health care Nivin Braniff will measure your blood pressure while you are seated, with your arm held at the level of your heart. It should be measured at least twice using the same arm. Certain conditions can cause a difference in blood pressure between your right and left arms. A blood pressure reading that is higher than normal on one occasion does not mean that you need treatment. If one blood pressure reading  is high, ask your health care Keyly Baldonado about having it checked again. TREATMENT  Treating high blood pressure includes making lifestyle changes and possibly taking medicine. Living a healthy lifestyle can help lower high blood pressure. You may need to change some of your habits. Lifestyle changes may include:  Following the DASH diet. This diet is high in fruits, vegetables, and whole grains. It is low in salt, red meat, and added sugars.  Getting at least 2 hours of brisk physical activity every week.  Losing weight if necessary.  Not smoking.  Limiting alcoholic beverages.  Learning ways to reduce stress. If lifestyle changes are not enough to get your blood pressure under control, your health care Taylore Hinde may prescribe medicine. You may need to take more than one. Work closely with your health care Danen Lapaglia to understand the risks and benefits. HOME CARE INSTRUCTIONS  Have your blood pressure rechecked as directed by your health care Deven Audi.   Take medicines only as directed by your health care Evo Aderman. Follow the directions carefully. Blood pressure medicines must be taken as prescribed. The medicine does not work as well when you skip doses. Skipping doses also puts you at risk for problems.   Do not smoke.   Monitor your blood pressure at home as directed by your health care Solstice Lastinger. SEEK MEDICAL CARE IF:   You think you are having a reaction to medicines taken.  You have recurrent headaches or feel dizzy.  You have swelling in your ankles.  You have trouble with your vision. SEEK IMMEDIATE MEDICAL CARE IF:  You develop a severe headache or confusion.    You have unusual weakness, numbness, or feel faint.  You have severe chest or abdominal pain.  You vomit repeatedly.  You have trouble breathing. MAKE SURE YOU:   Understand these instructions.  Will watch your condition.  Will get help right away if you are not doing well or get worse. Document  Released: 09/20/2005 Document Revised: 02/04/2014 Document Reviewed: 07/13/2013 [facility] Patient Information 2015 [place], [place]. This information is not intended to replace advice given to you by your health care Lerone Onder. Make sure you discuss any questions you have with your health care Madalina Rosman.

## 2015-06-30 NOTE — Progress Notes (Signed)
Subjective:  Patient ID: Phyllis [name], female    DOB: 08-21-1956  Age: 59 y.o. MRN: [id number]  CC: Hypothyroidism; Hypertension; and Osteoarthritis   HPI Phyllis [name] presents for hypertension and hypothyroidism. She tells me her blood pressure has not been well controlled. She was initially prescribed Bystolic but says it was too expensive. She was later prescribed a generic beta blocker but due to some confusion she has not been taking that either. She fortunately does not have any secondary symptoms of hypertension. She also complains of pain today in both knees and her low back and needs a refill on medications for pain and anxiety. She describes the pain in her knees as an intermittent toothache.  Outpatient Prescriptions Prior to Visit  Medication Sig Dispense Refill  . Albuterol Sulfate (PROAIR RESPICLICK) 706 (90 BASE) MCG/ACT AEPB Inhale 1 puff into the lungs 4 (four) times daily as needed (For shortness of breath.). 1 each 0  . aspirin EC 81 MG tablet Take 81 mg by mouth at bedtime.     [name] atorvastatin (LIPITOR) 40 MG tablet Take 1 tablet (40 mg total) by mouth daily at 6 PM. 30 tablet 11  . bisoprolol (ZEBETA) 5 MG tablet Take 1 tablet (5 mg total) by mouth daily. 30 tablet 11  . Fluticasone Furoate-Vilanterol 100-25 MCG/INH AEPB Inhale 1 puff into the lungs daily. 60 each 1  . gabapentin (NEURONTIN) 300 MG capsule Take 1 capsule (300 mg total) by mouth 2 (two) times daily. 60 capsule 0  . hydrochlorothiazide (MICROZIDE) 12.5 MG capsule Take 1 capsule (12.5 mg total) by mouth daily. 90 capsule 1  . ipratropium-albuterol (DUONEB) 0.5-2.5 (3) MG/3ML SOLN Take 3 mLs by nebulization 3 (three) times daily. 360 mL 0  . potassium chloride SA (KLOR-CON M20) 20 MEQ tablet Take 1 tablet (20 mEq total) by mouth 2 (two) times daily. 60 tablet 0  . sodium chloride (OCEAN) 0.65 % SOLN nasal spray Place 1 spray into both nostrils as needed for congestion. 480 mL 0  . triamcinolone  ointment (KENALOG) 0.1 % Apply 1 application topically 2 (two) times daily. 30 g 0  . Umeclidinium Bromide (INCRUSE ELLIPTA) 62.5 MCG/INH AEPB Inhale 1 puff into the lungs daily. 30 each 11  . ALPRAZolam (XANAX) 0.5 MG tablet Take 1 tablet (0.5 mg total) by mouth 2 (two) times daily. (Patient taking differently: Take 0.5 mg by mouth 3 (three) times daily as needed. ) 50 tablet 2  . nebivolol (BYSTOLIC) 10 MG tablet Take 1 tablet (10 mg total) by mouth daily. 30 tablet 11  . oxyCODONE (ROXICODONE) 15 MG immediate release tablet Take 1 tablet (15 mg total) by mouth every 8 (eight) hours as needed. For pain. 90 tablet 0   No facility-administered medications prior to visit.    ROS Review of Systems  Constitutional: Negative.  Negative for fever, chills, diaphoresis, appetite change, fatigue and unexpected weight change.  HENT: Negative.   Eyes: Negative.   Respiratory: Positive for shortness of breath (chronic and unchanged). Negative for cough, choking, chest tightness, wheezing and stridor.   Cardiovascular: Negative.  Negative for chest pain, palpitations and leg swelling.  Gastrointestinal: Negative.  Negative for nausea, vomiting, abdominal pain, diarrhea, constipation and blood in stool.  Endocrine: Negative.   Genitourinary: Negative.   Musculoskeletal: Positive for back pain and arthralgias. Negative for myalgias, joint swelling, gait problem, neck pain and neck stiffness.  Skin: Negative.  Negative for rash.  Allergic/Immunologic: Negative.   Neurological: Negative.  Negative for dizziness, syncope, speech difficulty, weakness, light-headedness, numbness and headaches.  Hematological: Negative.  Negative for adenopathy. Does not bruise/bleed easily.  Psychiatric/Behavioral: Positive for sleep disturbance. Negative for suicidal ideas, hallucinations, behavioral problems, confusion, self-injury, dysphoric mood, decreased concentration and agitation. The patient is nervous/anxious. The  patient is not hyperactive.     Objective:  BP 170/92 mmHg  Pulse 103  Temp(Src) 98.1 F (36.7 C) (Oral)  Resp 16  Ht _0  (1.626 m)  Wt 134 lb (60.782 kg)  BMI 22.99 kg/m2  SpO2 94%  LMP 10/05/1999  BP Readings from Last 3 Encounters:  06/30/15 170/92  06/18/15 178/80  05/13/15 148/72    Wt Readings from Last 3 Encounters:  06/30/15 134 lb (60.782 kg)  06/18/15 136 lb 9.6 oz (61.961 kg)  05/13/15 143 lb (64.864 kg)    Physical Exam  Constitutional: She is oriented to person, place, and time.  Non-toxic appearance. She does not have a sickly appearance. She does not appear ill. No distress.  She uses continuous oxygen  HENT:  Head: Normocephalic and atraumatic.  Mouth/Throat: Oropharynx is clear and moist. No oropharyngeal exudate.  Eyes: Conjunctivae are normal. Right eye exhibits no discharge. Left eye exhibits no discharge. No scleral icterus.  Neck: Normal range of motion. Neck supple. No JVD present. No tracheal deviation present. No thyromegaly present.  Cardiovascular: Normal rate, regular rhythm, normal heart sounds and intact distal pulses.  Exam reveals no gallop and no friction rub.   No murmur heard. Pulmonary/Chest: Effort normal and breath sounds normal. No stridor. No respiratory distress. She has no wheezes. She has no rales. She exhibits no tenderness.  Abdominal: Soft. Bowel sounds are normal. She exhibits no distension and no mass. There is no tenderness. There is no rebound and no guarding.  Musculoskeletal: Normal range of motion. She exhibits no edema or tenderness.       Right knee: She exhibits deformity (DJD). She exhibits normal range of motion, no swelling and no effusion. No tenderness found.       Left knee: She exhibits deformity (DJD). She exhibits normal range of motion, no swelling and no effusion. No tenderness found.  Lymphadenopathy:    She has no cervical adenopathy.  Neurological: She is oriented to person, place, and time. She has  normal strength. She displays no atrophy, no tremor and normal reflexes. No cranial nerve deficit or sensory deficit. She exhibits normal muscle tone. She displays a negative Romberg sign. She displays no seizure activity. Coordination and gait normal.  Reflex Scores:      Tricep reflexes are 1+ on the right side and 1+ on the left side.      Bicep reflexes are 1+ on the right side and 1+ on the left side.      Brachioradialis reflexes are 1+ on the right side and 1+ on the left side.      Patellar reflexes are 1+ on the right side and 1+ on the left side.      Achilles reflexes are 1+ on the right side and 1+ on the left side. Neg SLR in BLE  Skin: Skin is warm and dry. No rash noted. She is not diaphoretic. No erythema. No pallor.  Psychiatric: She has a normal mood and affect. Her behavior is normal. Judgment and thought content normal.  Vitals reviewed.   Lab Results  Component Value Date   WBC 2.7* 03/31/2015   HGB 12.8 03/31/2015   HCT 41.0 03/31/2015   PLT  81* 03/31/2015   GLUCOSE 125* 04/01/2015   CHOL 63 03/28/2015   TRIG 44 03/28/2015   HDL 25* 03/28/2015   LDLCALC 29 03/28/2015   ALT 17 03/27/2015   AST 39 03/27/2015   NA 136 04/01/2015   K 3.7 04/01/2015   CL 94* 04/01/2015   CREATININE 0.68 04/01/2015   BUN 8 04/01/2015   CO2 35* 04/01/2015   TSH 2.171 03/28/2015   INR 1.64* 03/27/2015   HGBA1C 5.7 07/27/2013    Mm Digital Diagnostic Unilat R  06/24/2015   CLINICAL DATA:  Post ultrasound-guided biopsy of a mass in the upper-outer right breast at 11 o'clock 10 cm from the nipple.  EXAM: DIAGNOSTIC RIGHT MAMMOGRAM POST ULTRASOUND BIOPSY  COMPARISON:  Previous exam(s).  FINDINGS: Mammographic images were obtained following ultrasound guided biopsy of the mass in the upper-outer right breast at 11 o'clock. A ribbon shaped biopsy marking clip is present in the mammographically stable mass, which appears unchanged since 03/05/2009.  IMPRESSION: Ribbon shaped biopsy  marking clip appropriately positioned post biopsy of a mass in the upper-outer right breast at 11 o'clock.  Final Assessment: Post Procedure Mammograms for Marker Placement   Electronically Signed   By: Everlean Alstrom M.D.   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 This was found to be concordant by Dr. Everlean Alstrom. Pathology results were discussed with the patient via telephone. The patient reported no problems with the biopsy site and is doing well. Post biopsy care and instructions were reviewed and questions were answered. The patient was encouraged to call [facility] with any additional questions and or concerns. A surgical referral was arranged with Dr. Autumn Messing of [facility] on July 07, 2015. Pathology results reported by Terie Purser RN on June 26, 2015. Electronically Signed   By: Everlean Alstrom M.D.   On: 06/26/2015 14:43  06/27/2015   CLINICAL DATA:  59 year old female with an indeterminate mass in the upper-outer right breast at 11 o'clock.  EXAM: ULTRASOUND GUIDED RIGHT BREAST CORE NEEDLE BIOPSY  COMPARISON:  Previous exam(s).  PROCEDURE: I met with the patient and we discussed the procedure of ultrasound-guided biopsy, including benefits and alternatives. We discussed the high likelihood of a successful procedure. We discussed the risks of the procedure including infection, bleeding, tissue injury, clip migration, and inadequate sampling. Informed written consent was given. The usual time-out protocol was performed immediately prior to the procedure.  Using sterile technique and 2% Lidocaine as local anesthetic, under direct ultrasound visualization, a 12 gauge vacuum-assisted device was used to perform biopsy of the mass in the upper-outer right breast at 11  o'clockusing a lateral to medial approach. At the conclusion of the procedure, a ribbon shaped tissue marker clip was deployed into the biopsy cavity. Follow-up 2-view mammogram was performed and dictated separately.  IMPRESSION: Ultrasound-guided biopsy of the mass in the upper-outer right breast at 11 o'clock. No apparent complications.  Electronically Signed: By: Everlean Alstrom M.D. On: 06/24/2015 15:24    Assessment & Plan:   Phyllis [name] was seen today for hypothyroidism, hypertension and osteoarthritis.  Diagnoses and all orders for this visit:  Other specified hypothyroidism- her TSH is in the normal range, she will stay on the current dose of levothyroxine -     TSH; Future  Essential hypertension, benign- her blood pressure is not well controlled, I've asked her to start generic carvedilol, her electrolytes and renal function are stable. -     Basic metabolic panel; Future -     carvedilol (COREG) 6.25 MG tablet; Take 1 tablet (6.25 mg total) by mouth 2 (two) times daily with a meal.  Right lumbar radiculitis -     Discontinue: oxyCODONE (ROXICODONE) 15 MG immediate release tablet; Take 1 tablet (15 mg total) by mouth every 8 (eight) hours as needed. For pain. -     Discontinue: oxyCODONE (ROXICODONE) 15 MG immediate release tablet; Take 1 tablet (15 mg total) by mouth every 8 (eight) hours as needed. For pain. -     oxyCODONE (ROXICODONE) 15 MG immediate release tablet; Take 1 tablet (15 mg total) by mouth every 8 (eight) hours as needed. For pain.  Depression with anxiety -     ALPRAZolam (XANAX) 0.5 MG tablet; Take 1 tablet (0.5 mg total) by mouth 3 (three) times daily as needed.  Hyperglycemia- her A1c is 6.5%. She has new onset type 2 diabetes mellitus. This does not need to be treated with a medication at this time. She will continue to work on her lifestyle modifications and I will continue to monitor her A1c. -     Hemoglobin A1c; Future  Other orders -     Flu Vaccine  QUAD 36+ mos IM  I have discontinued Phyllis [name]'s nebivolol. I have also changed her ALPRAZolam. Additionally, I am having her start on carvedilol. Lastly, I am having her maintain her aspirin EC, Albuterol Sulfate, gabapentin, potassium chloride SA, triamcinolone ointment, ipratropium-albuterol, sodium chloride, bisoprolol, atorvastatin, hydrochlorothiazide, Fluticasone Furoate-Vilanterol, Umeclidinium Bromide, and oxyCODONE.  Meds ordered this encounter  Medications  . DISCONTD: oxyCODONE (ROXICODONE) 15 MG immediate release tablet    Sig: Take 1 tablet (15 mg total) by mouth every 8 (eight) hours as needed. For pain.    Dispense:  90 tablet    Refill:  0    Fill on or after 06/30/15  . ALPRAZolam (XANAX) 0.5 MG tablet    Sig: Take 1 tablet (0.5 mg total) by mouth 3 (three) times daily as needed.    Dispense:  50 tablet    Refill:  2  . DISCONTD: oxyCODONE (ROXICODONE) 15 MG immediate release tablet    Sig: Take 1 tablet (15 mg total) by mouth every 8 (eight) hours as needed. For pain.    Dispense:  90 tablet    Refill:  0    Fill on or after 07/30/15  . oxyCODONE (ROXICODONE) 15 MG immediate release tablet    Sig: Take 1 tablet (15 mg total) by mouth every 8 (eight) hours as needed. For pain.    Dispense:  90 tablet    Refill:  0    Fill on or after 08/30/15  . carvedilol (COREG) 6.25 MG tablet    Sig: Take 1 tablet (6.25 mg total) by mouth 2 (two) times daily with a meal.    Dispense:  60 tablet    Refill:  5     Follow-up: No Follow-up on file.  [name], MD

## 2015-07-01 ENCOUNTER — Encounter: Payer: Self-pay | Admitting: Internal Medicine

## 2015-07-01 DIAGNOSIS — R739 Hyperglycemia, unspecified: Secondary | ICD-10-CM | POA: Diagnosis not present

## 2015-07-01 DIAGNOSIS — F418 Other specified anxiety disorders: Secondary | ICD-10-CM | POA: Diagnosis not present

## 2015-07-01 DIAGNOSIS — E038 Other specified hypothyroidism: Secondary | ICD-10-CM | POA: Diagnosis not present

## 2015-07-02 DIAGNOSIS — J449 Chronic obstructive pulmonary disease, unspecified: Secondary | ICD-10-CM | POA: Diagnosis not present

## 2015-07-07 ENCOUNTER — Ambulatory Visit: Payer: Self-pay | Admitting: Internal Medicine

## 2015-07-07 DIAGNOSIS — D219 Benign neoplasm of connective and other soft tissue, unspecified: Secondary | ICD-10-CM | POA: Diagnosis not present

## 2015-07-14 ENCOUNTER — Telehealth: Payer: Self-pay | Admitting: Pulmonary Disease

## 2015-07-14 MED ORDER — IPRATROPIUM-ALBUTEROL 0.5-2.5 (3) MG/3ML IN SOLN: 3.0000 mL | Freq: Three times a day (TID) | RESPIRATORY_TRACT | Status: DC

## 2015-07-14 NOTE — Telephone Encounter (Signed)
Pt requesting refill on Duoneb.  This has been sent to verified pharmacy.  Nothing further needed.

## 2015-07-15 ENCOUNTER — Encounter: Payer: Self-pay | Admitting: *Deleted

## 2015-07-15 ENCOUNTER — Other Ambulatory Visit: Payer: Self-pay | Admitting: *Deleted

## 2015-07-15 NOTE — Patient Outreach (Signed)
[facility]) Care Management   07/15/2015  Phyllis [name] 26-May-1956 [id number]  Phyllis [name] is an 59 y.o. female  Subjective:  COPD/Home O2: Pt reports she can tell a difference from when she started this program up to now with the changes made in her life. Pt states prior to [facility] she was not wearing her oxygen and felt very bad and irritated toward her family. Pt states and reflects back on the first day this RN approached her on the initial visit and inquire on why she was not wearing her oxygen. Pt states once she received the education on why it was importance to wear her oxygen and explained her low saturation she has been wearing her oxygen everywhere she travels. States she feels better and has less symptoms of drowsiness and less irritation.  Pt very grateful and thankful for working with her over the months to improve her overall health.  MEDIATION: Pt reports she had some changes in her medications and has started all new dosages with no reported problems. States she is not aware of the new medication as far as the purpose but knows it has something to do with her BP (Coreg). Pt reports she is taking all her other medications with no delays and has enough refills. PRODUCTIVE COUGH: Pt states after last month's home visit with this RN she went to and obtained Mucinex which has helped tremendously with her cough and congestion. Pt stats all resolved with no addition issues and she is aware to act quickly with any congestion due to her COPD flare ups in the future.  NEW MEDICATION: Pt reports she is taking her new medication but not clear on the purpose of this medications. Requested educations (Coreg)  Objective:   Review of Systems  Constitutional: Negative.   HENT: Negative.   Eyes: Negative.   Respiratory: Negative.   Cardiovascular: Negative.   Gastrointestinal: Negative.   Genitourinary: Negative.   Musculoskeletal: Negative.   Skin: Negative.    Neurological: Negative.   Endo/Heme/Allergies: Negative.   Psychiatric/Behavioral: Negative.     Physical Exam  Constitutional: She is oriented to person, place, and time. She appears well-developed and well-nourished.  HENT:  Right Ear: External ear normal.  Left Ear: External ear normal.  Neck: Normal range of motion.  Cardiovascular: Normal heart sounds.   Respiratory: Effort normal and breath sounds normal.  GI: Soft. Bowel sounds are normal.  Musculoskeletal: Normal range of motion.  Neurological: She is alert and oriented to person, place, and time.  Skin: Skin is warm and dry.  Psychiatric: She has a normal mood and affect. Her behavior is normal. Judgment and thought content normal.    Current Medications:   Current Outpatient Prescriptions  Medication Sig Dispense Refill  . Albuterol Sulfate (PROAIR RESPICLICK) 175 (90 BASE) MCG/ACT AEPB Inhale 1 puff into the lungs 4 (four) times daily as needed (For shortness of breath.). 1 each 0  . ALPRAZolam (XANAX) 0.5 MG tablet Take 1 tablet (0.5 mg total) by mouth 3 (three) times daily as needed. 50 tablet 2  . aspirin EC 81 MG tablet Take 81 mg by mouth at bedtime.     [name] atorvastatin (LIPITOR) 40 MG tablet Take 1 tablet (40 mg total) by mouth daily at 6 PM. 30 tablet 11  . carvedilol (COREG) 6.25 MG tablet Take 1 tablet (6.25 mg total) by mouth 2 (two) times daily with a meal. 60 tablet 5  . Fluticasone Furoate-Vilanterol 100-25 MCG/INH AEPB Inhale  1 puff into the lungs daily. 60 each 1  . gabapentin (NEURONTIN) 300 MG capsule Take 1 capsule (300 mg total) by mouth 2 (two) times daily. 60 capsule 0  . hydrochlorothiazide (MICROZIDE) 12.5 MG capsule Take 1 capsule (12.5 mg total) by mouth daily. 90 capsule 1  . ipratropium-albuterol (DUONEB) 0.5-2.5 (3) MG/3ML SOLN Take 3 mLs by nebulization 3 (three) times daily. 360 mL 3  . oxyCODONE (ROXICODONE) 15 MG immediate release tablet Take 1 tablet (15 mg total) by mouth every 8 (eight)  hours as needed. For pain. 90 tablet 0  . potassium chloride SA (KLOR-CON M20) 20 MEQ tablet Take 1 tablet (20 mEq total) by mouth 2 (two) times daily. 60 tablet 0  . sodium chloride (OCEAN) 0.65 % SOLN nasal spray Place 1 spray into both nostrils as needed for congestion. 480 mL 0  . triamcinolone ointment (KENALOG) 0.1 % Apply 1 application topically 2 (two) times daily. 30 g 0  . Umeclidinium Bromide (INCRUSE ELLIPTA) 62.5 MCG/INH AEPB Inhale 1 puff into the lungs daily. (Patient not taking: Reported on 07/15/2015) 30 each 11   No current facility-administered medications for this visit.    Functional Status:   In your present state of health, do you have any difficulty performing the following activities: 04/16/2015 03/27/2015  Hearing? N N  Vision? N N  Difficulty concentrating or making decisions? N N  Walking or climbing stairs? N N  Dressing or bathing? N N  Doing errands, shopping? N N  Preparing Food and eating ? N -  Using the Toilet? N -  In the past six months, have you accidently leaked urine? N -  Do you have problems with loss of bowel control? N -  Managing your Medications? N -  Managing your Finances? N -  Housekeeping or managing your Housekeeping? N -    Fall/Depression Screening:    PHQ 2/9 Scores 04/16/2015 02/06/2015 09/30/2014  PHQ - 2 Score 0 0 0   Filed Vitals:   07/15/15 1507  BP: 132/70  Pulse: 60  Resp: 20     Assessment:   Follow up on case management related to COPD Home O2 Follow up on medication issues related to adherence Follow up on OTC treatment related to productive cough New medication related to education (Coreg)  Plan:  Physical assessment completed with no acute symptoms found today as pt continue to manager her care appropriately. Verified pt remains in the GREEN zone on the COPD action plan and able to perform teach back method with awareness of signs and symptoms and what to do if acute symptoms are encountered. Verified pt  continues to attend all medical appointments with no delays or issues or cancellations. All with good reports and no other then the pending surgery to remove and breast mass once cleared with her providers.  Verified pt continues to wear her home O2 on 3 liters with no problems as pt reports she wears her oxygen 24/7 with no encountered problems.  Verified pt is taking all her prescribed medications with no delays and has enough refills.  Verified pt productive cough has resolved with OTC Mucinex and no additional symptoms. Pt denies any fevers, chills or related symptoms. Will continue to encourage OTC medicine to assist with pt's productive cough as pt again aware to act accordingly to prevent further symptoms from getting worse.  Will educate on new medication and discuss the purpose and possible side effects if encountered and what to do.  Plan  of care will be discussed and goals verified as met. Will alert pt's primary that pt has met all goals and will be discharged via [facility] services today with no further interventions needed. Will also offer any needed resources and encourage pt to seek other resources in the [facility] calendar if needed. Also discussed the "211" contact line for further assistance needed in the community. No further inquires or request at this time. Case will be closed.  [name], RN Care Management Coordinator [facility] Main Office [phone]

## 2015-07-16 ENCOUNTER — Telehealth: Payer: Self-pay | Admitting: Pulmonary Disease

## 2015-07-16 NOTE — Telephone Encounter (Signed)
Received message from Dr. Marlou Starks with general surgery.  Pt needs pre-op pulmonary assessment prior to having breast surgery for tumor.  She needs ROV for pre-op pulmonary assessment >> can be with any provider if I do not having ROV available soon.    Will route to my nurse to schedule appointment.

## 2015-07-17 NOTE — Telephone Encounter (Signed)
Spoke with pt. States that she is scheduled with [facility] on 07/31/15 for her 3 month ROV. She would prefer to [facility] do her surgical clearance on this day as well. Nothing further was needed at this time.

## 2015-07-21 NOTE — Patient Outreach (Signed)
[facility]) Care Management  07/21/2015  LUWANNA BROSSMAN [birth year] [id number]   Notification from [name], RN to close case due to goals met with [place] Management.  Thanks, [name]. [place], [place] Assistant Phone: [phone] Fax: [phone]

## 2015-07-31 ENCOUNTER — Encounter: Payer: Self-pay | Admitting: Pulmonary Disease

## 2015-07-31 ENCOUNTER — Telehealth: Payer: Self-pay | Admitting: Pulmonary Disease

## 2015-07-31 ENCOUNTER — Ambulatory Visit (INDEPENDENT_AMBULATORY_CARE_PROVIDER_SITE_OTHER): Payer: Medicare Other | Admitting: Pulmonary Disease

## 2015-07-31 VITALS — BP 122/70 | HR 79 | Temp 98.2°F | Ht 64.0 in | Wt 144.8 lb

## 2015-07-31 DIAGNOSIS — J432 Centrilobular emphysema: Secondary | ICD-10-CM

## 2015-07-31 DIAGNOSIS — J9611 Chronic respiratory failure with hypoxia: Secondary | ICD-10-CM

## 2015-07-31 DIAGNOSIS — Z01811 Encounter for preprocedural respiratory examination: Secondary | ICD-10-CM

## 2015-07-31 NOTE — Progress Notes (Addendum)
Chief Complaint  Patient presents with  . Follow-up    Pt following for Centrilobular Emphysema: pt states she is doing well. pt here for sugery clearance for a mass in right breast. no c/o SOB, chest tighness, wheezing or cough. pt using continuous O2 at 3LPM. DME: [facility]    History of Present Illness: Phyllis [name] is a 59 y.o. 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  She is doing okay with her breathing.  She is using 3 liters oxygen.  Her leg swelling improved after amlodipine was stopped.  She is using breo, duoneb, and albuterol >> these all help.  She was sent a script for incruse, but has not been using this.   TESTS: Echo 03/28/15 >> EF 65 to 70%, PAH 67 mmHg CT of Chest 03/29/15 >> moderate centrilobular emphysema, 6 mm LLL nodule, small pericardial effusion, trace R pleural fluid, cirrhosis, chronic calcific pancreatitis V/Q scan 04/01/15 >> low probability for PE  A1AT 04/09/15 >> 170, MM PFT 04/25/15 >> FEV1 0.92 (44%), FEV1% 50, TLC 4.05 (80%), DLCO 22%, no BD.   Past medical hx >> GERD, Hepatitis C with cirrhosis, Chronic pancreatitis, HTN, Neuropathy, Hypothyroidism, Depression   She  has past surgical history that includes Cholecystectomy; Breast lumpectomy; Tubal ligation; Tonsillectomy; Lumbar laminectomy; and left heart catheterization with coronary angiogram (N/A, 03/08/2013).   Her family history includes COPD in her father; Hypertension in her mother; Kidney disease in her mother. There is no history of Colon cancer or Stomach cancer.   She  reports that she quit smoking about 3 months ago. Her smoking use included Cigarettes. She has a 3.5 pack-year smoking history. She has never used smokeless tobacco. She reports that she does not drink alcohol or use illicit drugs.   Allergies  Allergen Reactions   . Amlodipine Other (See Comments)    EDEMA  . Meperidine Hcl Other (See Comments)    hallucinations  . Naproxen Other (See Comments)    Extreme bruising    Medication Sig Dispense Refill  . Albuterol Sulfate (PROAIR RESPICLICK) 759 (90 BASE) MCG/ACT AEPB Inhale 1 puff into the lungs 4 (four) times daily as needed (For shortness of breath.). 1 each 0  . ALPRAZolam (XANAX) 0.5 MG tablet Take 1 tablet (0.5 mg total) by mouth 3 (three) times daily as needed. 50 tablet 2  . aspirin EC 81 MG tablet Take 81 mg by mouth at bedtime.     [name] atorvastatin (LIPITOR) 40 MG tablet Take 1 tablet (40 mg total) by mouth daily at 6 PM. 30 tablet 11  . carvedilol (COREG) 6.25 MG tablet Take 1 tablet (6.25 mg total) by mouth 2 (two) times daily with a meal. 60 tablet 5  . Fluticasone Furoate-Vilanterol 100-25 MCG/INH AEPB Inhale 1 puff into the lungs daily. 60 each 1  . gabapentin (NEURONTIN) 300 MG capsule Take 1 capsule (300 mg total) by mouth 2 (two) times daily. 60 capsule 0  . hydrochlorothiazide (MICROZIDE) 12.5 MG capsule Take 1 capsule (12.5 mg total) by mouth daily. 90 capsule 1  . ipratropium-albuterol (DUONEB) 0.5-2.5 (3) MG/3ML SOLN Take 3 mLs by nebulization 3 (three) times daily. 360 mL 3  . oxyCODONE (ROXICODONE) 15 MG immediate release tablet Take 1 tablet (15 mg total) by mouth every 8 (eight) hours as needed. For pain. 90 tablet 0  .  potassium chloride SA (KLOR-CON M20) 20 MEQ tablet Take 1 tablet (20 mEq total) by mouth 2 (two) times daily. 60 tablet 0  . triamcinolone ointment (KENALOG) 0.1 % Apply 1 application topically 2 (two) times daily. 30 g 0  . sodium chloride (OCEAN) 0.65 % SOLN nasal spray Place 1 spray into both nostrils as needed for congestion. (Patient not taking: Reported on 07/31/2015) 480 mL 0  . Umeclidinium Bromide (INCRUSE ELLIPTA) 62.5 MCG/INH AEPB Inhale 1 puff into the lungs daily. (Patient not taking: Reported on 07/15/2015) 30 each 11    Physical Exam: BP 122/70  mmHg  Pulse 79  Temp(Src) 98.2 F (36.8 C) (Oral)  Ht _0  (1.626 m)  Wt 144 lb 12.8 oz (65.681 kg)  BMI 24.84 kg/m2  SpO2 92%  LMP 10/05/1999  General - No distress, wearing oxygen ENT - No sinus tenderness, no oral exudate, no LAN Cardiac - s1s2 regular, no murmur Chest - No wheeze/rales/dullness Back - No focal tenderness Abd - Soft, non-tender Ext - No edema Neuro - Normal strength Skin - No rashes Psych - normal mood, and behavior   Assessment/Plan:  COPD with emphysema. Plan: - continue breo, duoneb, and prn albuterol  Chronic respiratory failure with hypoxia. Plan: - continue 3 liters oxygen 24/7  Lt lower lung nodule. Plan: - follow up CT chest w/o contrast in June 2017  Preoperative respiratory exam. Discussion: She is being assessed for right breast surgery.  I have explained to her that this is a relatively low risk surgery, but she is a very high risk candidate given severity of her emphysema and respiratory failure in addition to have cirrhosis.  Main pulmonary concern is that she will develop ventilator dependence and would be difficult to wean off ventilator. Plan: - will discuss with Dr. Marlou Starks and determine whether benefit of surgery outweighs risk   Chesley Mires, MD [place] [facility]/Critical [facility]/Sleep Pager:  [phone]  Spoke with Dr. Marlou Starks.  Decision to delay surgery and monitor right breast lesion.  Chesley Mires, MD [facility] Pulmonary/Critical Care 07/31/2015, 4:15 PM Pager:  [phone] After 3pm call: [phone]

## 2015-07-31 NOTE — Patient Instructions (Signed)
Will call once I speak to Dr. Marlou Starks and let you know plans for surgery  Follow up in 6 months

## 2015-07-31 NOTE — Telephone Encounter (Signed)
Left message - spoke with Dr. Marlou Starks.  Will delay surgery.  She is to call back with questions.

## 2015-08-01 DIAGNOSIS — J449 Chronic obstructive pulmonary disease, unspecified: Secondary | ICD-10-CM | POA: Diagnosis not present

## 2015-08-05 ENCOUNTER — Other Ambulatory Visit: Payer: Self-pay | Admitting: Pulmonary Disease

## 2015-08-11 ENCOUNTER — Ambulatory Visit: Payer: Self-pay | Admitting: Internal Medicine

## 2015-08-13 NOTE — Patient Outreach (Signed)
[facility]) Care Management  08/13/2015  Phyllis [name] [birth year] [id number]   Notification received from [name], RN to close case due to all goals being met.  Aras Albarran L. Jordy Hewins, [place] Care Management Assistant

## 2015-08-20 ENCOUNTER — Other Ambulatory Visit: Payer: Self-pay | Admitting: Internal Medicine

## 2015-09-01 ENCOUNTER — Ambulatory Visit (INDEPENDENT_AMBULATORY_CARE_PROVIDER_SITE_OTHER): Payer: Medicare Other | Admitting: Internal Medicine

## 2015-09-01 ENCOUNTER — Encounter: Payer: Self-pay | Admitting: Internal Medicine

## 2015-09-01 VITALS — BP 138/78 | HR 84 | Temp 98.1°F | Resp 16 | Ht 64.0 in | Wt 136.0 lb

## 2015-09-01 DIAGNOSIS — F418 Other specified anxiety disorders: Secondary | ICD-10-CM | POA: Diagnosis not present

## 2015-09-01 DIAGNOSIS — J449 Chronic obstructive pulmonary disease, unspecified: Secondary | ICD-10-CM | POA: Diagnosis not present

## 2015-09-01 DIAGNOSIS — M5417 Radiculopathy, lumbosacral region: Secondary | ICD-10-CM | POA: Diagnosis not present

## 2015-09-01 DIAGNOSIS — M5416 Radiculopathy, lumbar region: Secondary | ICD-10-CM

## 2015-09-01 MED ORDER — ALPRAZOLAM 0.5 MG PO TABS: 0.5000 mg | ORAL_TABLET | Freq: Three times a day (TID) | ORAL | Status: DC | PRN

## 2015-09-01 MED ORDER — OXYCODONE HCL 15 MG PO TABS: 15.0000 mg | ORAL_TABLET | Freq: Four times a day (QID) | ORAL | Status: DC | PRN

## 2015-09-01 NOTE — Progress Notes (Signed)
Subjective:  Patient ID: Phyllis [name], female    DOB: Dec 05, 1955  Age: 59 y.o. MRN: [id number]  CC: Back Pain   HPI Phyllis [name] presents for f/up on chronic LBP = "it aches like a toothache" , oxycodone helps and it allows her to be more active. The pain does not radiate. She also requests a refill on xanax but needs it TID to control her anxiety and panic.  Outpatient Prescriptions Prior to Visit  Medication Sig Dispense Refill  . Albuterol Sulfate (PROAIR RESPICLICK) 269 (90 BASE) MCG/ACT AEPB Inhale 1 puff into the lungs 4 (four) times daily as needed (For shortness of breath.). 1 each 0  . aspirin EC 81 MG tablet Take 81 mg by mouth at bedtime.     [name] atorvastatin (LIPITOR) 40 MG tablet Take 1 tablet (40 mg total) by mouth daily at 6 PM. 30 tablet 11  . BREO ELLIPTA 100-25 MCG/INH AEPB INHALE 1 PUFF INTO THE LUNGS DAILY 60 each 5  . carvedilol (COREG) 6.25 MG tablet Take 1 tablet (6.25 mg total) by mouth 2 (two) times daily with a meal. 60 tablet 5  . gabapentin (NEURONTIN) 300 MG capsule Take 1 capsule (300 mg total) by mouth 2 (two) times daily. 60 capsule 0  . hydrochlorothiazide (MICROZIDE) 12.5 MG capsule Take 1 capsule (12.5 mg total) by mouth daily. 90 capsule 1  . ipratropium-albuterol (DUONEB) 0.5-2.5 (3) MG/3ML SOLN Take 3 mLs by nebulization 3 (three) times daily. 360 mL 3  . potassium chloride SA (KLOR-CON M20) 20 MEQ tablet Take 1 tablet (20 mEq total) by mouth 2 (two) times daily. 60 tablet 0  . sodium chloride (OCEAN) 0.65 % SOLN nasal spray Place 1 spray into both nostrils as needed for congestion. 480 mL 0  . triamcinolone ointment (KENALOG) 0.1 % Apply 1 application topically 2 (two) times daily. 30 g 0  . ALPRAZolam (XANAX) 0.5 MG tablet TAKE 1 TABLET BY MOUTH THREE TIMES DAILY AS NEEDED 50 tablet 2  . oxyCODONE (ROXICODONE) 15 MG immediate release tablet Take 1 tablet (15 mg total) by mouth every 8 (eight) hours as needed. For pain. 90 tablet 0   No  facility-administered medications prior to visit.    ROS Review of Systems  Constitutional: Negative.  Negative for fever, chills, diaphoresis, appetite change and fatigue.  HENT: Negative.   Eyes: Negative.   Respiratory: Negative.  Negative for cough, choking, chest tightness, shortness of breath and stridor.   Cardiovascular: Negative.  Negative for chest pain, palpitations and leg swelling.  Gastrointestinal: Negative.  Negative for nausea, vomiting, abdominal pain, diarrhea and constipation.  Endocrine: Negative.   Genitourinary: Negative.   Musculoskeletal: Positive for back pain. Negative for myalgias, arthralgias and neck pain.  Skin: Negative.  Negative for rash.  Allergic/Immunologic: Negative.   Neurological: Negative.   Hematological: Negative.  Negative for adenopathy. Does not bruise/bleed easily.  Psychiatric/Behavioral: Negative for suicidal ideas, hallucinations, behavioral problems, confusion, sleep disturbance, self-injury, dysphoric mood, decreased concentration and agitation. The patient is nervous/anxious. The patient is not hyperactive.     Objective:  BP 138/78 mmHg  Pulse 84  Temp(Src) 98.1 F (36.7 C) (Oral)  Resp 16  Ht _0  (1.626 m)  Wt 136 lb (61.689 kg)  BMI 23.33 kg/m2  SpO2 95%  LMP 10/05/1999  BP Readings from Last 3 Encounters:  09/01/15 138/78  07/31/15 122/70  07/15/15 132/70    Wt Readings from Last 3 Encounters:  09/01/15 136 lb (61.689 kg)  07/31/15 144 lb 12.8 oz (65.681 kg)  07/15/15 136 lb 6.4 oz (61.871 kg)    Physical Exam  Constitutional: She is oriented to person, place, and time. She appears well-developed and well-nourished. She has a sickly appearance. She appears ill. No distress.  On continuous O2  HENT:  Mouth/Throat: Oropharynx is clear and moist. No oropharyngeal exudate.  Eyes: Conjunctivae are normal. Right eye exhibits no discharge. Left eye exhibits no discharge. No scleral icterus.  Neck: Normal range of  motion. Neck supple. No JVD present. No tracheal deviation present. No thyromegaly present.  Cardiovascular: Normal rate, regular rhythm, normal heart sounds and intact distal pulses.  Exam reveals no gallop and no friction rub.   No murmur heard. Pulmonary/Chest: Effort normal and breath sounds normal. No stridor. No respiratory distress. She has no wheezes. She has no rales. She exhibits no tenderness.  Abdominal: Soft. Bowel sounds are normal. She exhibits no distension and no mass. There is no tenderness. There is no rebound and no guarding.  Musculoskeletal: Normal range of motion. She exhibits no edema or tenderness.       Lumbar back: Normal.  Lymphadenopathy:    She has no cervical adenopathy.  Neurological: She is oriented to person, place, and time. She has normal strength. She displays no atrophy and no tremor. No cranial nerve deficit or sensory deficit. She exhibits normal muscle tone. She displays a negative Romberg sign. She displays no seizure activity. Coordination and gait normal.  Neg SLR in BLE  Skin: Skin is warm and dry. No rash noted. She is not diaphoretic. No erythema. No pallor.  Psychiatric: She has a normal mood and affect. Her behavior is normal. Judgment and thought content normal.  Vitals reviewed.   Lab Results  Component Value Date   WBC 2.7* 03/31/2015   HGB 12.8 03/31/2015   HCT 41.0 03/31/2015   PLT 81* 03/31/2015   GLUCOSE 105* 06/30/2015   CHOL 63 03/28/2015   TRIG 44 03/28/2015   HDL 25* 03/28/2015   LDLCALC 29 03/28/2015   ALT 17 03/27/2015   AST 39 03/27/2015   NA 138 06/30/2015   K 4.1 06/30/2015   CL 101 06/30/2015   CREATININE 0.89 06/30/2015   BUN 7 06/30/2015   CO2 29 06/30/2015   TSH 1.22 06/30/2015   INR 1.64* 03/27/2015   HGBA1C 6.5 06/30/2015    Mm Digital Diagnostic Unilat R  06/24/2015  CLINICAL DATA:  Post ultrasound-guided biopsy of a mass in the upper-outer right breast at 11 o'clock 10 cm from the nipple. EXAM:  DIAGNOSTIC RIGHT MAMMOGRAM POST ULTRASOUND BIOPSY COMPARISON:  Previous exam(s). FINDINGS: Mammographic images were obtained following ultrasound guided biopsy of the mass in the upper-outer right breast at 11 o'clock. A ribbon shaped biopsy marking clip is present in the mammographically stable mass, which appears unchanged since 03/05/2009. IMPRESSION: Ribbon shaped biopsy marking clip appropriately positioned post biopsy of a mass in the upper-outer right breast at 11 o'clock. Final Assessment: Post Procedure Mammograms for Marker Placement Electronically Signed   By: Everlean Alstrom M.D.   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 This was found to be concordant by Dr. Everlean Alstrom. Pathology results were discussed with the patient via telephone. The patient reported no problems with the biopsy site and is doing well. Post biopsy care  and instructions were reviewed and questions were answered. The patient was encouraged to call [facility] with any additional questions and or concerns. A surgical referral was arranged with Dr. Autumn Messing of [facility] on July 07, 2015. Pathology results reported by Terie Purser RN on June 26, 2015. Electronically Signed   By: Everlean Alstrom M.D.   On: 06/26/2015 14:43  06/27/2015  CLINICAL DATA:  59 year old female with an indeterminate mass in the upper-outer right breast at 11 o'clock. EXAM: ULTRASOUND GUIDED RIGHT BREAST CORE NEEDLE BIOPSY COMPARISON:  Previous exam(s). PROCEDURE: I met with the patient and we discussed the procedure of ultrasound-guided biopsy, including benefits and alternatives. We discussed the high likelihood of a successful procedure. We discussed the risks of the procedure including infection, bleeding, tissue injury, clip  migration, and inadequate sampling. Informed written consent was given. The usual time-out protocol was performed immediately prior to the procedure. Using sterile technique and 2% Lidocaine as local anesthetic, under direct ultrasound visualization, a 12 gauge vacuum-assisted device was used to perform biopsy of the mass in the upper-outer right breast at 11 o'clockusing a lateral to medial approach. At the conclusion of the procedure, a ribbon shaped tissue marker clip was deployed into the biopsy cavity. Follow-up 2-view mammogram was performed and dictated separately. IMPRESSION: Ultrasound-guided biopsy of the mass in the upper-outer right breast at 11 o'clock. No apparent complications. Electronically Signed: By: Everlean Alstrom M.D. On: 06/24/2015 15:24    Assessment & Plan:   Phyllis [name] was seen today for back pain.  Diagnoses and all orders for this visit:  Right lumbar radiculitis -     Discontinue: oxyCODONE (ROXICODONE) 15 MG immediate release tablet; Take 1 tablet (15 mg total) by mouth every 6 (six) hours as needed. For pain. -     Discontinue: oxyCODONE (ROXICODONE) 15 MG immediate release tablet; Take 1 tablet (15 mg total) by mouth every 6 (six) hours as needed. For pain. -     oxyCODONE (ROXICODONE) 15 MG immediate release tablet; Take 1 tablet (15 mg total) by mouth every 6 (six) hours as needed. For pain.  Depression with anxiety -     ALPRAZolam (XANAX) 0.5 MG tablet; Take 1 tablet (0.5 mg total) by mouth 3 (three) times daily as needed.   I have changed Phyllis [name]'s ALPRAZolam. I am also having her maintain her aspirin EC, Albuterol Sulfate, gabapentin, potassium chloride SA, triamcinolone ointment, sodium chloride, atorvastatin, hydrochlorothiazide, carvedilol, ipratropium-albuterol, BREO ELLIPTA, bisoprolol, and oxyCODONE.  Meds ordered this encounter  Medications  . bisoprolol (ZEBETA) 5 MG tablet    Sig:     Refill:  11  . DISCONTD: oxyCODONE (ROXICODONE) 15 MG  immediate release tablet    Sig: Take 1 tablet (15 mg total) by mouth every 6 (six) hours as needed. For pain.    Dispense:  120 tablet    Refill:  0    Fill on or after 08/30/15  . ALPRAZolam (XANAX) 0.5 MG tablet    Sig: Take 1 tablet (0.5 mg total) by mouth 3 (three) times daily as needed.    Dispense:  90 tablet    Refill:  3  . DISCONTD: oxyCODONE (ROXICODONE) 15 MG immediate release tablet    Sig: Take 1 tablet (15 mg total) by mouth every 6 (six) hours as needed. For pain.    Dispense:  120 tablet    Refill:  0    Fill on or after 09/29/15  . oxyCODONE (  ROXICODONE) 15 MG immediate release tablet    Sig: Take 1 tablet (15 mg total) by mouth every 6 (six) hours as needed. For pain.    Dispense:  120 tablet    Refill:  0    Fill on or after 10/30/15     Follow-up: Return in about 3 months (around 12/02/2015).  [name], MD

## 2015-09-01 NOTE — Progress Notes (Signed)
Pre visit review using our clinic review tool, if applicable. No additional management support is needed unless otherwise documented below in the visit note.

## 2015-09-03 ENCOUNTER — Encounter: Payer: Self-pay | Admitting: Internal Medicine

## 2015-10-01 DIAGNOSIS — J449 Chronic obstructive pulmonary disease, unspecified: Secondary | ICD-10-CM | POA: Diagnosis not present

## 2015-11-01 DIAGNOSIS — J449 Chronic obstructive pulmonary disease, unspecified: Secondary | ICD-10-CM | POA: Diagnosis not present

## 2015-11-18 ENCOUNTER — Telehealth: Payer: Self-pay

## 2015-11-18 NOTE — Telephone Encounter (Signed)
Paperwork received and placed on MD's desk uncompleted based on last DMV medical (see scanned media).

## 2015-12-02 ENCOUNTER — Encounter: Payer: Self-pay | Admitting: Internal Medicine

## 2015-12-02 ENCOUNTER — Other Ambulatory Visit (INDEPENDENT_AMBULATORY_CARE_PROVIDER_SITE_OTHER): Payer: Medicare Other

## 2015-12-02 ENCOUNTER — Ambulatory Visit (INDEPENDENT_AMBULATORY_CARE_PROVIDER_SITE_OTHER): Payer: Medicare Other | Admitting: Internal Medicine

## 2015-12-02 VITALS — BP 150/94 | HR 67 | Temp 97.8°F | Resp 16 | Ht 64.0 in | Wt 134.0 lb

## 2015-12-02 DIAGNOSIS — E118 Type 2 diabetes mellitus with unspecified complications: Secondary | ICD-10-CM

## 2015-12-02 DIAGNOSIS — M5416 Radiculopathy, lumbar region: Secondary | ICD-10-CM

## 2015-12-02 DIAGNOSIS — I10 Essential (primary) hypertension: Secondary | ICD-10-CM

## 2015-12-02 DIAGNOSIS — F418 Other specified anxiety disorders: Secondary | ICD-10-CM

## 2015-12-02 DIAGNOSIS — E038 Other specified hypothyroidism: Secondary | ICD-10-CM

## 2015-12-02 DIAGNOSIS — I2510 Atherosclerotic heart disease of native coronary artery without angina pectoris: Secondary | ICD-10-CM

## 2015-12-02 DIAGNOSIS — J449 Chronic obstructive pulmonary disease, unspecified: Secondary | ICD-10-CM | POA: Diagnosis not present

## 2015-12-02 DIAGNOSIS — M5417 Radiculopathy, lumbosacral region: Secondary | ICD-10-CM

## 2015-12-02 LAB — COMPREHENSIVE METABOLIC PANEL
ALBUMIN: 4.1 g/dL (ref 3.5–5.2)
ALT: 9 U/L (ref 0–35)
AST: 15 U/L (ref 0–37)
Alkaline Phosphatase: 90 U/L (ref 39–117)
BUN: 4 mg/dL — AB (ref 6–23)
CHLORIDE: 93 meq/L — AB (ref 96–112)
CO2: 32 meq/L (ref 19–32)
CREATININE: 0.74 mg/dL (ref 0.40–1.20)
Calcium: 9.5 mg/dL (ref 8.4–10.5)
GFR: 102.48 mL/min (ref >60.00)
Glucose, Bld: 109 mg/dL — ABNORMAL HIGH (ref 70–99)
Potassium: 3.5 mEq/L (ref 3.5–5.1)
SODIUM: 132 meq/L — AB (ref 135–145)
Total Bilirubin: 0.5 mg/dL (ref 0.2–1.2)
Total Protein: 7.5 g/dL (ref 6.0–8.3)

## 2015-12-02 LAB — CBC WITH DIFFERENTIAL/PLATELET
BASOS PCT: 0.3 % (ref 0.0–3.0)
Basophils Absolute: 0 10*3/uL (ref 0.0–0.1)
EOS PCT: 0.7 % (ref 0.0–5.0)
Eosinophils Absolute: 0 10*3/uL (ref 0.0–0.7)
HEMATOCRIT: 46.5 % — AB (ref 36.0–46.0)
HEMOGLOBIN: 15.3 g/dL — AB (ref 12.0–15.0)
Lymphocytes Relative: 25.4 % (ref 12.0–46.0)
Lymphs Abs: 1.8 10*3/uL (ref 0.7–4.0)
MCHC: 33 g/dL (ref 30.0–36.0)
MCV: 94 fl (ref 78.0–100.0)
Monocytes Absolute: 0.4 10*3/uL (ref 0.1–1.0)
Monocytes Relative: 5.7 % (ref 3.0–12.0)
NEUTROS ABS: 4.9 10*3/uL (ref 1.4–7.7)
Neutrophils Relative %: 67.9 % (ref 43.0–77.0)
PLATELETS: 201 10*3/uL (ref 150.0–400.0)
RBC: 4.94 Mil/uL (ref 3.87–5.11)
RDW: 13.9 % (ref 11.5–15.5)
WBC: 7.2 10*3/uL (ref 4.0–10.5)

## 2015-12-02 LAB — TSH: TSH: 1 u[IU]/mL (ref 0.35–4.50)

## 2015-12-02 LAB — URINALYSIS, ROUTINE W REFLEX MICROSCOPIC
Bilirubin Urine: NEGATIVE
Hgb urine dipstick: NEGATIVE
Ketones, ur: NEGATIVE
Leukocytes, UA: NEGATIVE
Nitrite: NEGATIVE
SPECIFIC GRAVITY, URINE: 1.01 (ref 1.000–1.030)
Total Protein, Urine: NEGATIVE
URINE GLUCOSE: NEGATIVE
UROBILINOGEN UA: 0.2 (ref 0.0–1.0)
pH: 7 (ref 5.0–8.0)

## 2015-12-02 LAB — MICROALBUMIN / CREATININE URINE RATIO
Creatinine,U: 61.6 mg/dL
MICROALB UR: 1 mg/dL (ref 0.0–1.9)
Microalb Creat Ratio: 1.6 mg/g (ref 0.0–30.0)

## 2015-12-02 LAB — HEMOGLOBIN A1C: Hgb A1c MFr Bld: 6.3 % (ref 4.6–6.5)

## 2015-12-02 MED ORDER — OXYCODONE HCL 15 MG PO TABS: 15.0000 mg | ORAL_TABLET | Freq: Four times a day (QID) | ORAL | Status: DC | PRN

## 2015-12-02 MED ORDER — GABAPENTIN 300 MG PO CAPS: 300.0000 mg | ORAL_CAPSULE | Freq: Two times a day (BID) | ORAL | Status: DC

## 2015-12-02 MED ORDER — CARVEDILOL 6.25 MG PO TABS: 6.2500 mg | ORAL_TABLET | Freq: Two times a day (BID) | ORAL | Status: DC

## 2015-12-02 MED ORDER — ALPRAZOLAM 0.5 MG PO TABS: 0.5000 mg | ORAL_TABLET | Freq: Three times a day (TID) | ORAL | Status: DC | PRN

## 2015-12-02 MED ORDER — HYDROCHLOROTHIAZIDE 12.5 MG PO CAPS: 12.5000 mg | ORAL_CAPSULE | Freq: Every day | ORAL | Status: DC

## 2015-12-02 NOTE — Progress Notes (Signed)
Pre visit review using our clinic review tool, if applicable. No additional management support is needed unless otherwise documented below in the visit note.

## 2015-12-02 NOTE — Progress Notes (Signed)
Subjective:  Patient ID: Phyllis [name], female    DOB: [birth year]  Age: 60 y.o. MRN: [id number]  CC: Hypertension; Back Pain; and Diabetes   HPI Jaymi A Goya presents for a blood pressure check. She tells me that her blood pressure has not been well controlled. She doesn't think she's been taking carvedilol. She requests a refill for hydrochlorothiazide because she thinks she's been taking that. She has no symptoms of high blood pressure such as headache, blurred vision, chest pain, shortness of breath, or edema.  She also has chronic low back pain and requests a refill on Roxicet.  She tells me that her blood sugars have been well controlled.  She has chronic shortness of breath related to COPD and tells me that she has not recently had any worsening of her symptoms.    Outpatient Prescriptions Prior to Visit  Medication Sig Dispense Refill  . Albuterol Sulfate (PROAIR RESPICLICK) 332 (90 BASE) MCG/ACT AEPB Inhale 1 puff into the lungs 4 (four) times daily as needed (For shortness of breath.). 1 each 0  . aspirin EC 81 MG tablet Take 81 mg by mouth at bedtime.     [name] atorvastatin (LIPITOR) 40 MG tablet Take 1 tablet (40 mg total) by mouth daily at 6 PM. 30 tablet 11  . BREO ELLIPTA 100-25 MCG/INH AEPB INHALE 1 PUFF INTO THE LUNGS DAILY 60 each 5  . ipratropium-albuterol (DUONEB) 0.5-2.5 (3) MG/3ML SOLN Take 3 mLs by nebulization 3 (three) times daily. 360 mL 3  . potassium chloride SA (KLOR-CON M20) 20 MEQ tablet Take 1 tablet (20 mEq total) by mouth 2 (two) times daily. 60 tablet 0  . sodium chloride (OCEAN) 0.65 % SOLN nasal spray Place 1 spray into both nostrils as needed for congestion. 480 mL 0  . triamcinolone ointment (KENALOG) 0.1 % Apply 1 application topically 2 (two) times daily. 30 g 0  . ALPRAZolam (XANAX) 0.5 MG tablet Take 1 tablet (0.5 mg total) by mouth 3 (three) times daily as needed. 90 tablet 3  . bisoprolol (ZEBETA) 5 MG tablet   11  . carvedilol (COREG)  6.25 MG tablet Take 1 tablet (6.25 mg total) by mouth 2 (two) times daily with a meal. 60 tablet 5  . gabapentin (NEURONTIN) 300 MG capsule Take 1 capsule (300 mg total) by mouth 2 (two) times daily. 60 capsule 0  . hydrochlorothiazide (MICROZIDE) 12.5 MG capsule Take 1 capsule (12.5 mg total) by mouth daily. 90 capsule 1  . oxyCODONE (ROXICODONE) 15 MG immediate release tablet Take 1 tablet (15 mg total) by mouth every 6 (six) hours as needed. For pain. 120 tablet 0   No facility-administered medications prior to visit.    ROS Review of Systems  Constitutional: Negative.  Negative for fever, chills, diaphoresis, appetite change and fatigue.  HENT: Negative.   Eyes: Negative.   Respiratory: Positive for shortness of breath and wheezing. Negative for cough, choking, chest tightness and stridor.   Cardiovascular: Negative.  Negative for chest pain, palpitations and leg swelling.  Gastrointestinal: Negative.  Negative for nausea, vomiting, abdominal pain, diarrhea and constipation.  Endocrine: Negative.   Genitourinary: Negative.  Negative for dysuria, urgency, hematuria, decreased urine volume, enuresis and difficulty urinating.  Musculoskeletal: Positive for back pain. Negative for myalgias, joint swelling and arthralgias.  Skin: Negative.  Negative for color change, pallor and rash.  Allergic/Immunologic: Negative.   Neurological: Negative.   Hematological: Negative.  Negative for adenopathy. Does not bruise/bleed easily.  Psychiatric/Behavioral: Negative  for suicidal ideas, sleep disturbance, dysphoric mood, decreased concentration and agitation. The patient is nervous/anxious.     Objective:  BP 150/94 mmHg  Pulse 67  Temp(Src) 97.8 F (36.6 C) (Oral)  Resp 16  Ht _0  (1.626 m)  Wt 134 lb (60.782 kg)  BMI 22.99 kg/m2  SpO2 96%  LMP 10/05/1999  BP Readings from Last 3 Encounters:  12/02/15 150/94  09/01/15 138/78  07/31/15 122/70    Wt Readings from Last 3 Encounters:    12/02/15 134 lb (60.782 kg)  09/01/15 136 lb (61.689 kg)  07/31/15 144 lb 12.8 oz (65.681 kg)    Physical Exam  Constitutional: She is oriented to person, place, and time.  Non-toxic appearance. She does not have a sickly appearance. She appears ill. No distress.  On continuous O2  HENT:  Mouth/Throat: Oropharynx is clear and moist. No oropharyngeal exudate.  Eyes: Conjunctivae are normal. Right eye exhibits no discharge. Left eye exhibits no discharge. No scleral icterus.  Neck: Normal range of motion. Neck supple. No JVD present. No tracheal deviation present. No thyromegaly present.  Cardiovascular: Normal rate, regular rhythm, normal heart sounds and intact distal pulses.  Exam reveals no gallop and no friction rub.   No murmur heard. Pulmonary/Chest: Effort normal and breath sounds normal. No stridor. No respiratory distress. She has no wheezes. She has no rales. She exhibits no tenderness.  Abdominal: Soft. Bowel sounds are normal. She exhibits no distension and no mass. There is no tenderness. There is no rebound and no guarding.  Musculoskeletal: Normal range of motion. She exhibits no edema or tenderness.  Lymphadenopathy:    She has no cervical adenopathy.  Neurological: She is oriented to person, place, and time.  Skin: Skin is warm and dry. No rash noted. She is not diaphoretic. No erythema. No pallor.    Lab Results  Component Value Date   WBC 7.2 12/02/2015   HGB 15.3* 12/02/2015   HCT 46.5* 12/02/2015   PLT 201.0 12/02/2015   GLUCOSE 109* 12/02/2015   CHOL 63 03/28/2015   TRIG 44 03/28/2015   HDL 25* 03/28/2015   LDLCALC 29 03/28/2015   ALT 9 12/02/2015   AST 15 12/02/2015   NA 132* 12/02/2015   K 3.5 12/02/2015   CL 93* 12/02/2015   CREATININE 0.74 12/02/2015   BUN 4* 12/02/2015   CO2 32 12/02/2015   TSH 1.00 12/02/2015   INR 1.64* 03/27/2015   HGBA1C 6.3 12/02/2015   MICROALBUR 1.0 12/02/2015    Mm Digital Diagnostic Unilat R  06/24/2015  CLINICAL  DATA:  Post ultrasound-guided biopsy of a mass in the upper-outer right breast at 11 o'clock 10 cm from the nipple. EXAM: DIAGNOSTIC RIGHT MAMMOGRAM POST ULTRASOUND BIOPSY COMPARISON:  Previous exam(s). FINDINGS: Mammographic images were obtained following ultrasound guided biopsy of the mass in the upper-outer right breast at 11 o'clock. A ribbon shaped biopsy marking clip is present in the mammographically stable mass, which appears unchanged since 03/05/2009. IMPRESSION: Ribbon shaped biopsy marking clip appropriately positioned post biopsy of a mass in the upper-outer right breast at 11 o'clock. Final Assessment: Post Procedure Mammograms for Marker Placement Electronically Signed   By: Everlean Alstrom M.D.   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 This was found to be concordant by Dr. Everlean Alstrom. Pathology results  were discussed with the patient via telephone. The patient reported no problems with the biopsy site and is doing well. Post biopsy care and instructions were reviewed and questions were answered. The patient was encouraged to call [facility] with any additional questions and or concerns. A surgical referral was arranged with [facility] on July 07, 2015. Pathology results reported by Terie Purser RN on June 26, 2015. Electronically Signed   By: Everlean Alstrom M.D.   On: 06/26/2015 14:43  06/27/2015  CLINICAL DATA:  60 year old female with an indeterminate mass in the upper-outer right breast at 11 o'clock. EXAM: ULTRASOUND GUIDED RIGHT BREAST CORE NEEDLE BIOPSY COMPARISON:  Previous exam(s). PROCEDURE: I met with the patient and we discussed the procedure of ultrasound-guided biopsy, including benefits and alternatives. We discussed the high  likelihood of a successful procedure. We discussed the risks of the procedure including infection, bleeding, tissue injury, clip migration, and inadequate sampling. Informed written consent was given. The usual time-out protocol was performed immediately prior to the procedure. Using sterile technique and 2% Lidocaine as local anesthetic, under direct ultrasound visualization, a 12 gauge vacuum-assisted device was used to perform biopsy of the mass in the upper-outer right breast at 11 o'clockusing a lateral to medial approach. At the conclusion of the procedure, a ribbon shaped tissue marker clip was deployed into the biopsy cavity. Follow-up 2-view mammogram was performed and dictated separately. IMPRESSION: Ultrasound-guided biopsy of the mass in the upper-outer right breast at 11 o'clock. No apparent complications. Electronically Signed: By: Everlean Alstrom M.D. On: 06/24/2015 15:24    Assessment & Plan:   Ceili was seen today for hypertension, back pain and diabetes.  Diagnoses and all orders for this visit:  Depression with anxiety -     ALPRAZolam (XANAX) 0.5 MG tablet; Take 1 tablet (0.5 mg total) by mouth 3 (three) times daily as needed.  Right lumbar radiculitis -     Discontinue: oxyCODONE (ROXICODONE) 15 MG immediate release tablet; Take 1 tablet (15 mg total) by mouth every 6 (six) hours as needed. For pain. -     Discontinue: oxyCODONE (ROXICODONE) 15 MG immediate release tablet; Take 1 tablet (15 mg total) by mouth every 6 (six) hours as needed. For pain. -     oxyCODONE (ROXICODONE) 15 MG immediate release tablet; Take 1 tablet (15 mg total) by mouth every 6 (six) hours as needed. For pain.  Essential hypertension, benign- her blood pressure is not well controlled, I have asked her to restart carvedilol, her sodium and chloride are slightly low consistent with diuretic therapy, she is asymptomatic with respect to this so will just monitor for now. -     carvedilol (COREG) 6.25  MG tablet; Take 1 tablet (6.25 mg total) by mouth 2 (two) times daily with a meal. -     hydrochlorothiazide (MICROZIDE) 12.5 MG capsule; Take 1 capsule (12.5 mg total) by mouth daily. -     Urinalysis, Routine w reflex microscopic (not at [facility]); Future -     Comprehensive metabolic panel; Future -     CBC with Differential/Platelet; Future  Other specified hypothyroidism- her TSH is in the normal range -     TSH; Future  Coronary artery disease involving native coronary artery of native heart without angina pectoris- she has had no signs or symptoms related to this, we will continue to treat with risk factor modification. -     carvedilol (COREG) 6.25 MG  tablet; Take 1 tablet (6.25 mg total) by mouth 2 (two) times daily with a meal.  Type 2 diabetes mellitus with complication, without long-term current use of insulin ([place])- her blood sugars are well-controlled, no medications are needed at this time. -     Microalbumin / creatinine urine ratio; Future -     Comprehensive metabolic panel; Future -     Hemoglobin A1c; Future  Other orders -     gabapentin (NEURONTIN) 300 MG capsule; Take 1 capsule (300 mg total) by mouth 2 (two) times daily.   I have discontinued Ms. Knick's bisoprolol. I am also having her maintain her aspirin EC, Albuterol Sulfate, potassium chloride SA, triamcinolone ointment, sodium chloride, atorvastatin, ipratropium-albuterol, BREO ELLIPTA, ALPRAZolam, gabapentin, carvedilol, hydrochlorothiazide, and oxyCODONE.  Meds ordered this encounter  Medications  . ALPRAZolam (XANAX) 0.5 MG tablet    Sig: Take 1 tablet (0.5 mg total) by mouth 3 (three) times daily as needed.    Dispense:  90 tablet    Refill:  3  . DISCONTD: oxyCODONE (ROXICODONE) 15 MG immediate release tablet    Sig: Take 1 tablet (15 mg total) by mouth every 6 (six) hours as needed. For pain.    Dispense:  120 tablet    Refill:  0    Fill on or after 12/02/15  . gabapentin (NEURONTIN) 300 MG capsule      Sig: Take 1 capsule (300 mg total) by mouth 2 (two) times daily.    Dispense:  60 capsule    Refill:  0  . carvedilol (COREG) 6.25 MG tablet    Sig: Take 1 tablet (6.25 mg total) by mouth 2 (two) times daily with a meal.    Dispense:  60 tablet    Refill:  5  . hydrochlorothiazide (MICROZIDE) 12.5 MG capsule    Sig: Take 1 capsule (12.5 mg total) by mouth daily.    Dispense:  90 capsule    Refill:  1  . DISCONTD: oxyCODONE (ROXICODONE) 15 MG immediate release tablet    Sig: Take 1 tablet (15 mg total) by mouth every 6 (six) hours as needed. For pain.    Dispense:  120 tablet    Refill:  0    Fill on or after 12/30/15  . oxyCODONE (ROXICODONE) 15 MG immediate release tablet    Sig: Take 1 tablet (15 mg total) by mouth every 6 (six) hours as needed. For pain.    Dispense:  120 tablet    Refill:  0    Fill on or after 01/30/16     Follow-up: Return in about 4 months (around 03/31/2016).  [name], MD

## 2015-12-03 ENCOUNTER — Encounter: Payer: Self-pay | Admitting: Internal Medicine

## 2015-12-17 ENCOUNTER — Other Ambulatory Visit: Payer: Self-pay

## 2015-12-17 NOTE — Patient Outreach (Signed)
[facility]) Care Management  12/17/2015  EMONII WIENKE 12/03/1955 [id number]   Unsuccessful attempt to contact patient referred for screening due to [facility] high risk list.  Message left requesting call back. RN will make another attempt within 5 working days if no response.  Candie Mile, RN, MSN [place] [phone] Fax [phone]

## 2015-12-19 DIAGNOSIS — J449 Chronic obstructive pulmonary disease, unspecified: Secondary | ICD-10-CM | POA: Diagnosis not present

## 2015-12-24 ENCOUNTER — Other Ambulatory Visit: Payer: Self-pay

## 2015-12-24 ENCOUNTER — Ambulatory Visit: Payer: Self-pay

## 2015-12-24 ENCOUNTER — Telehealth: Payer: Self-pay | Admitting: Pulmonary Disease

## 2015-12-24 NOTE — Patient Outreach (Signed)
[facility]) Care Management  12/24/2015  Phyllis [name] 04-Mar-1956 [id number]   Screening call completed to patient referred due to [facility] CHF high risk list.  Patient has a history of COPD, CHF, HTN, and Diabetes.  However, she reports no recent admissions or visits to ED.  She has been a [facility] client in the past- most recently seen by community RN [name] (discharged in October 2016).  Reviewed with her [facility] services.  She still has [facility] contact information, and states she would definitely call if she needed assistance.  Patient reports she feels that she is currently managing her chronic illnesses without any problems.  She is independent, and cares for her husband and her grandson.  She drives herself to her medical appointments, and sees her PCP and pulmonologist on a regular basis.  She reports taking her medications as ordered.  Patient declines [facility] services for now.  Encouraged her to call us if there is a need in the future.  Candie Mile, RN, MSN [place] [phone] Fax [phone]

## 2015-12-24 NOTE — Telephone Encounter (Signed)
Pt states that she is unable to afford the [facility] and needs something cheaper prescribed. I advised the patient that when she comes to pick up samples of Breo to bring her insurance formulary with her so that we can look at it and get a cheaper alternative. Samples placed up front of Breo 100.  Will hold in triage for drug formulary

## 2015-12-25 NOTE — Telephone Encounter (Signed)
Pt dropped off formulary book gave to [employer]

## 2015-12-25 NOTE — Telephone Encounter (Signed)
Forwarding to [place] to follow up on

## 2015-12-25 NOTE — Telephone Encounter (Signed)
Will send to Dr Halford Chessman to advise on an alternative when he returns next week. Pt given samples to cover her until he is able to make adjustments.  Drug formulary placed in VS cubby with this telephone note attached. Please advise. Thanks.

## 2015-12-29 DIAGNOSIS — J449 Chronic obstructive pulmonary disease, unspecified: Secondary | ICD-10-CM | POA: Diagnosis not present

## 2015-12-29 NOTE — Telephone Encounter (Signed)
Please inform her that all the comparable inhalers are listed as same tier with Breo.  Only other option would be to have her get pulmicort 0.5 mg nebulized bid, and continue using duoneb one vial q4 to 6 hours prn.

## 2015-12-29 NOTE — Telephone Encounter (Signed)
Called patient and left a detailed message letting her know that her Medicare book is up front to be picked up. Nothing further needed.

## 2015-12-29 NOTE — Telephone Encounter (Signed)
Spoke with pt. She is aware of the below information. Pt does not know what she would like to do, she will think about it and call us back.

## 2015-12-30 DIAGNOSIS — J449 Chronic obstructive pulmonary disease, unspecified: Secondary | ICD-10-CM | POA: Diagnosis not present

## 2016-01-06 ENCOUNTER — Other Ambulatory Visit: Payer: Self-pay | Admitting: Internal Medicine

## 2016-01-27 ENCOUNTER — Ambulatory Visit (INDEPENDENT_AMBULATORY_CARE_PROVIDER_SITE_OTHER): Payer: Medicare Other | Admitting: Pulmonary Disease

## 2016-01-27 ENCOUNTER — Encounter: Payer: Self-pay | Admitting: Pulmonary Disease

## 2016-01-27 VITALS — BP 124/92 | HR 75 | Ht 66.0 in | Wt 139.8 lb

## 2016-01-27 DIAGNOSIS — J9611 Chronic respiratory failure with hypoxia: Secondary | ICD-10-CM

## 2016-01-27 DIAGNOSIS — R911 Solitary pulmonary nodule: Secondary | ICD-10-CM | POA: Diagnosis not present

## 2016-01-27 DIAGNOSIS — J432 Centrilobular emphysema: Secondary | ICD-10-CM

## 2016-01-27 MED ORDER — BUDESONIDE 0.5 MG/2ML IN SUSP: 0.5000 mg | Freq: Two times a day (BID) | RESPIRATORY_TRACT | Status: DC

## 2016-01-27 NOTE — Patient Instructions (Addendum)
Pulmicort one vial nebulized twice per day >> rinse mouth after each use  Will call you after your lung scan is done in June  Follow up in 6 months

## 2016-01-27 NOTE — Progress Notes (Signed)
. Current Outpatient Prescriptions on File Prior to Visit  Medication Sig  . Albuterol Sulfate (PROAIR RESPICLICK) 517 (90 BASE) MCG/ACT AEPB Inhale 1 puff into the lungs 4 (four) times daily as needed (For shortness of breath.).  [name] ALPRAZolam (XANAX) 0.5 MG tablet Take 1 tablet (0.5 mg total) by mouth 3 (three) times daily as needed.  [name] aspirin EC 81 MG tablet Take 81 mg by mouth at bedtime.   [name] atorvastatin (LIPITOR) 40 MG tablet Take 1 tablet (40 mg total) by mouth daily at 6 PM.  . carvedilol (COREG) 6.25 MG tablet Take 1 tablet (6.25 mg total) by mouth 2 (two) times daily with a meal.  . gabapentin (NEURONTIN) 300 MG capsule TAKE ONE CAPSULE BY MOUTH TWICE DAILY  . hydrochlorothiazide (MICROZIDE) 12.5 MG capsule Take 1 capsule (12.5 mg total) by mouth daily.  [name] ipratropium-albuterol (DUONEB) 0.5-2.5 (3) MG/3ML SOLN Take 3 mLs by nebulization 3 (three) times daily.  [name] oxyCODONE (ROXICODONE) 15 MG immediate release tablet Take 1 tablet (15 mg total) by mouth every 6 (six) hours as needed. For pain.  . potassium chloride SA (KLOR-CON M20) 20 MEQ tablet Take 1 tablet (20 mEq total) by mouth 2 (two) times daily.  . sodium chloride (OCEAN) 0.65 % SOLN nasal spray Place 1 spray into both nostrils as needed for congestion.  . triamcinolone ointment (KENALOG) 0.1 % Apply 1 application topically 2 (two) times daily.  [name] BREO ELLIPTA 100-25 MCG/INH AEPB INHALE 1 PUFF INTO THE LUNGS DAILY (Patient not taking: Reported on 01/27/2016)   No current facility-administered medications on file prior to visit.    Chief Complaint  Patient presents with  . Follow-up    Pt reports no change in breathing since last OV - denies any increased SOB a this time. Needs updated O2 Rx. Requests handicapped placard.  Pt cannot afford Breo- having to save money to get inhaler when she can afford it - discuss options     Tests Echo 03/28/15 >> EF 65 to 70%, PAH 67 mmHg CT of Chest 03/29/15 >> moderate centrilobular  emphysema, 6 mm LLL nodule, small pericardial effusion, trace R pleural fluid, cirrhosis, chronic calcific pancreatitis V/Q scan 04/01/15 >> low probability for PE  A1AT 04/09/15 >> 170, MM PFT 04/25/15 >> FEV1 0.92 (44%), FEV1% 50, TLC 4.05 (80%), DLCO 22%, no BD.  Past medical history GERD, Hepatitis C with cirrhosis, Chronic pancreatitis, HTN, Neuropathy, Hypothyroidism, Depression  Past surgical history, Family history, Social history, Allergies all reviewed  Vital signs BP 124/92 mmHg  Pulse 75  Ht _0  (1.676 m)  Wt 139 lb 12.8 oz (63.413 kg)  BMI 22.58 kg/m2  SpO2 96%  LMP 10/05/1999   History of Present Illness: Phyllis [name] is a 60 y.o. 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  She is not able to afford breo.  She has been using duoneb several times per day.  She has a little more shortness of breath with pollen and weather change.  She is not having much cough.  She denies wheeze, fever, chest pain, or leg swelling.  She requalified for home oxygen today.  Physical Exam:  General - No distress, wearing oxygen ENT - No sinus tenderness, no oral exudate, no LAN Cardiac - s1s2 regular, no murmur Chest - No wheeze/rales/dullness Back - No focal tenderness Abd - Soft, non-tender Ext - No edema Neuro - Normal strength Skin - No rashes Psych - normal mood, and behavior  Assessment/Plan:  COPD with emphysema >> she as difficulty affording her mediations. - will try to get her set up for pulmicort by nebulizer - continue duoneb, and prn albuterol - completed handicap parking form  Chronic respiratory failure with hypoxia. Plan: - continue 3 liters oxygen 24/7  Lt lower lung nodule. Plan: - follow up CT chest w/o contrast in June 2017 >> will call her with results   Patient Instructions  Pulmicort one vial nebulized twice per day >> rinse mouth after each use  Will call you after your lung scan is  done in June  Follow up in 6 months     Chesley Mires, MD [name] 01/27/2016, 3:58 PM Pager:  [phone]

## 2016-01-30 DIAGNOSIS — J449 Chronic obstructive pulmonary disease, unspecified: Secondary | ICD-10-CM | POA: Diagnosis not present

## 2016-02-03 ENCOUNTER — Other Ambulatory Visit: Payer: Self-pay | Admitting: Internal Medicine

## 2016-02-09 ENCOUNTER — Other Ambulatory Visit: Payer: Self-pay | Admitting: *Deleted

## 2016-02-09 DIAGNOSIS — E876 Hypokalemia: Secondary | ICD-10-CM

## 2016-02-09 MED ORDER — POTASSIUM CHLORIDE CRYS ER 20 MEQ PO TBCR: 20.0000 meq | EXTENDED_RELEASE_TABLET | Freq: Two times a day (BID) | ORAL | Status: DC

## 2016-02-09 NOTE — Telephone Encounter (Signed)
Requesting refill on her potassium old prescription has expired...Phyllis [name]

## 2016-02-13 ENCOUNTER — Telehealth: Payer: Self-pay

## 2016-02-13 NOTE — Telephone Encounter (Signed)
Call to schedule AWV this year; States she is having an AWV from [facility] and nurse is scheduled to come out to her home in June. Explained that the doctor did not order [facility] to send nurse out and that practice is accountable for completing per medicare; Agreed to stay post her fup exam on 5/25/ at 1:15;

## 2016-02-26 ENCOUNTER — Ambulatory Visit (INDEPENDENT_AMBULATORY_CARE_PROVIDER_SITE_OTHER): Payer: Medicare Other | Admitting: Internal Medicine

## 2016-02-26 ENCOUNTER — Encounter: Payer: Self-pay | Admitting: Internal Medicine

## 2016-02-26 ENCOUNTER — Other Ambulatory Visit (INDEPENDENT_AMBULATORY_CARE_PROVIDER_SITE_OTHER): Payer: Medicare Other

## 2016-02-26 VITALS — BP 164/88 | HR 72 | Temp 98.4°F | Resp 16 | Ht 66.0 in | Wt 139.0 lb

## 2016-02-26 DIAGNOSIS — J449 Chronic obstructive pulmonary disease, unspecified: Secondary | ICD-10-CM | POA: Diagnosis not present

## 2016-02-26 DIAGNOSIS — M5417 Radiculopathy, lumbosacral region: Secondary | ICD-10-CM

## 2016-02-26 DIAGNOSIS — I10 Essential (primary) hypertension: Secondary | ICD-10-CM

## 2016-02-26 DIAGNOSIS — IMO0001 Reserved for inherently not codable concepts without codable children: Secondary | ICD-10-CM

## 2016-02-26 DIAGNOSIS — M5416 Radiculopathy, lumbar region: Secondary | ICD-10-CM

## 2016-02-26 DIAGNOSIS — I25110 Atherosclerotic heart disease of native coronary artery with unstable angina pectoris: Secondary | ICD-10-CM

## 2016-02-26 DIAGNOSIS — E876 Hypokalemia: Secondary | ICD-10-CM | POA: Diagnosis not present

## 2016-02-26 DIAGNOSIS — Z0000 Encounter for general adult medical examination without abnormal findings: Secondary | ICD-10-CM

## 2016-02-26 DIAGNOSIS — E038 Other specified hypothyroidism: Secondary | ICD-10-CM | POA: Diagnosis not present

## 2016-02-26 LAB — LIPID PANEL
CHOL/HDL RATIO: 2
Cholesterol: 93 mg/dL (ref 0–200)
HDL: 45.3 mg/dL (ref >39.00)
LDL CALC: 37 mg/dL (ref 0–99)
NONHDL: 47.49
TRIGLYCERIDES: 52 mg/dL (ref 0.0–149.0)
VLDL: 10.4 mg/dL (ref 0.0–40.0)

## 2016-02-26 LAB — BASIC METABOLIC PANEL
BUN: 4 mg/dL — ABNORMAL LOW (ref 6–23)
CALCIUM: 9.8 mg/dL (ref 8.4–10.5)
CHLORIDE: 94 meq/L — AB (ref 96–112)
CO2: 37 meq/L — AB (ref 19–32)
CREATININE: 0.65 mg/dL (ref 0.40–1.20)
GFR: 118.93 mL/min (ref >60.00)
GLUCOSE: 109 mg/dL — AB (ref 70–99)
Potassium: 3.9 mEq/L (ref 3.5–5.1)
Sodium: 130 mEq/L — ABNORMAL LOW (ref 135–145)

## 2016-02-26 LAB — MAGNESIUM: MAGNESIUM: 1.6 mg/dL (ref 1.5–2.5)

## 2016-02-26 MED ORDER — OXYCODONE HCL 15 MG PO TABS: 15.0000 mg | ORAL_TABLET | Freq: Four times a day (QID) | ORAL | Status: DC | PRN

## 2016-02-26 MED ORDER — TRIAMTERENE-HCTZ 50-25 MG PO CAPS: 1.0000 | ORAL_CAPSULE | ORAL | Status: DC

## 2016-02-26 NOTE — Patient Instructions (Addendum)
Phyllis [name] , Thank you for taking time to come for your Medicare Wellness Visit. I appreciate your ongoing commitment to your health goals. Please review the following plan we discussed and let me know if I can assist you in the future.   Will call the  Breast center for plan for next mammogram   Will schedule an eye exam /eye mart;  Phone: [phone]  Will discuss LT plan; will fup with you by phone. Manuela Schwartz [phone])   The shingles is covered under Part D; it is a live virus  Educated to check with insurance regarding coverage of Shingles vaccination on Part D or Part B and may have lower co-pay if provided on the Part D side You can ask what the cost would be on Part D vs in the office on Part; b  Pap deferred to Dr. Ronnald Ramp at next visit;     These are the goals we discussed: Goals    . patient     Patient desires to get off oxygen; stopped smoking May 2016      . Quit smoking / using tobacco     Smoking;  Educated to avoid secondary smoke Smoking cessation at [facility]: [phone] Meds may help; chatix (Varenicline); Zyban (Bupropion SR); Nicotine Replacement (gum; lozenges; patches; etc.) [place] quit line 1-800-QUIT-NOW 564-237-7665).           This is a list of the screening recommended for you and due dates:  Health Maintenance  Topic Date Due  . Eye exam for diabetics  06/15/2013  . Complete foot exam   03/26/2014  . Pap Smear  04/14/2014  . Shingles Vaccine  07/19/2014  . Flu Shot  05/04/2016  . Hemoglobin A1C  05/31/2016  . Urine Protein Check  12/01/2016  . Colon Cancer Screening  02/10/2017  . Mammogram  06/23/2017  . Pneumococcal vaccine (2) 08/23/2017  . Tetanus Vaccine  08/06/2020  .  Hepatitis C: One time screening is recommended by Center for Disease Control  (CDC) for  adults born from [birth year] through 1967.   Completed  . HIV Screening  Completed      Hypertension Hypertension, commonly called high blood pressure, is when the force of blood  pumping through your arteries is too strong. Your arteries are the blood vessels that carry blood from your heart throughout your body. A blood pressure reading consists of a higher number over a lower number, such as 110/72. The higher number (systolic) is the pressure inside your arteries when your heart pumps. The lower number (diastolic) is the pressure inside your arteries when your heart relaxes. Ideally you want your blood pressure below 120/80. Hypertension forces your heart to work harder to pump blood. Your arteries may become narrow or stiff. Having untreated or uncontrolled hypertension can cause heart attack, stroke, kidney disease, and other problems. RISK FACTORS Some risk factors for high blood pressure are controllable. Others are not.  Risk factors you cannot control include:   Race. You may be at higher risk if you are African American.  Age. Risk increases with age.  Gender. Men are at higher risk than women before age 2 years. After age 58, women are at higher risk than men. Risk factors you can control include:  Not getting enough exercise or physical activity.  Being overweight.  Getting too much fat, sugar, calories, or salt in your diet.  Drinking too much alcohol. SIGNS AND SYMPTOMS Hypertension does not usually cause signs or symptoms. Extremely high blood  pressure (hypertensive crisis) may cause headache, anxiety, shortness of breath, and nosebleed. DIAGNOSIS To check if you have hypertension, your health care provider will measure your blood pressure while you are seated, with your arm held at the level of your heart. It should be measured at least twice using the same arm. Certain conditions can cause a difference in blood pressure between your right and left arms. A blood pressure reading that is higher than normal on one occasion does not mean that you need treatment. If it is not clear whether you have high blood pressure, you may be asked to return on a  different day to have your blood pressure checked again. Or, you may be asked to monitor your blood pressure at home for 1 or more weeks. TREATMENT Treating high blood pressure includes making lifestyle changes and possibly taking medicine. Living a healthy lifestyle can help lower high blood pressure. You may need to change some of your habits. Lifestyle changes may include:  Following the DASH diet. This diet is high in fruits, vegetables, and whole grains. It is low in salt, red meat, and added sugars.  Keep your sodium intake below 2,300 mg per day.  Getting at least 30-45 minutes of aerobic exercise at least 4 times per week.  Losing weight if necessary.  Not smoking.  Limiting alcoholic beverages.  Learning ways to reduce stress. Your health care provider may prescribe medicine if lifestyle changes are not enough to get your blood pressure under control, and if one of the following is true:  You are 74-105 years of age and your systolic blood pressure is above 140.  You are 28 years of age or older, and your systolic blood pressure is above 150.  Your diastolic blood pressure is above 90.  You have diabetes, and your systolic blood pressure is over 638 or your diastolic blood pressure is over 90.  You have kidney disease and your blood pressure is above 140/90.  You have heart disease and your blood pressure is above 140/90. Your personal target blood pressure may vary depending on your medical conditions, your age, and other factors. HOME CARE INSTRUCTIONS  Have your blood pressure rechecked as directed by your health care provider.   Take medicines only as directed by your health care provider. Follow the directions carefully. Blood pressure medicines must be taken as prescribed. The medicine does not work as well when you skip doses. Skipping doses also puts you at risk for problems.  Do not smoke.   Monitor your blood pressure at home as directed by your health care  provider. SEEK MEDICAL CARE IF:   You think you are having a reaction to medicines taken.  You have recurrent headaches or feel dizzy.  You have swelling in your ankles.  You have trouble with your vision. SEEK IMMEDIATE MEDICAL CARE IF:  You develop a severe headache or confusion.  You have unusual weakness, numbness, or feel faint.  You have severe chest or abdominal pain.  You vomit repeatedly.  You have trouble breathing. MAKE SURE YOU:   Understand these instructions.  Will watch your condition.  Will get help right away if you are not doing well or get worse.   This information is not intended to replace advice given to you by your health care provider. Make sure you discuss any questions you have with your health care provider.   Document Released: 09/20/2005 Document Revised: 02/04/2015 Document Reviewed: 07/13/2013 Elsevier Interactive Patient Education [employer].  Osteoporosis Osteoporosis is the thinning and loss of density in the bones. Osteoporosis makes the bones more brittle, fragile, and likely to break (fracture). Over time, osteoporosis can cause the bones to become so weak that they fracture after a simple fall. The bones most likely to fracture are the bones in the hip, wrist, and spine. CAUSES  The exact cause is not known. RISK FACTORS Anyone can develop osteoporosis. You may be at greater risk if you have a family history of the condition or have poor nutrition. You may also have a higher risk if you are:   Female.   21 years old or older.  A smoker.  Not physically active.   White or Asian.  Slender. SIGNS AND SYMPTOMS  A fracture might be the first sign of the disease, especially if it results from a fall or injury that would not usually cause a bone to break. Other signs and symptoms include:   Low back and neck pain.  Stooped posture.  Height loss. DIAGNOSIS  To make a diagnosis, your health care provider  may:  Take a medical history.  Perform a physical exam.  Order tests, such as:  A bone mineral density test.  A dual-energy X-ray absorptiometry test. TREATMENT  The goal of osteoporosis treatment is to strengthen your bones to reduce your risk of a fracture. Treatment may involve:  Making lifestyle changes, such as:  Eating a diet rich in calcium.  Doing weight-bearing and muscle-strengthening exercises.  Stopping tobacco use.  Limiting alcohol intake.  Taking medicine to slow the process of bone loss or to increase bone density.  Monitoring your levels of calcium and vitamin D. HOME CARE INSTRUCTIONS  Include calcium and vitamin D in your diet. Calcium is important for bone health, and vitamin D helps the body absorb calcium. (124m of calcium in food or supplement; Vit 800u )   Perform weight-bearing and muscle-strengthening exercises as directed by your health care provider.  Do not use any tobacco products, including cigarettes, chewing tobacco, and electronic cigarettes. If you need help quitting, ask your health care provider.  Limit your alcohol intake.  Take medicines only as directed by your health care provider.  Keep all follow-up visits as directed by your health care provider. This is important.  Take precautions at home to lower your risk of falling, such as:  Keeping rooms well lit and clutter free.  Installing safety rails on stairs.  Using rubber mats in the bathroom and other areas that are often wet or slippery. SEEK IMMEDIATE MEDICAL CARE IF:  You fall or injure yourself.    This information is not intended to replace advice given to you by your health care provider. Make sure you discuss any questions you have with your health care provider.   Document Released: 06/30/2005 Document Revised: 10/11/2014 Document Reviewed: 02/28/2014 Elsevier Interactive Patient Education [employer]
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
## 2016-02-26 NOTE — Progress Notes (Signed)
Pre visit review using our clinic review tool, if applicable. No additional management support is needed unless otherwise documented below in the visit note.

## 2016-02-26 NOTE — Progress Notes (Signed)
Subjective:  Patient ID: Phyllis [name], female    DOB: Jan 19, 1956  Age: 60 y.o. MRN: [id number]  CC: Follow-up; Hypothyroidism; Hypertension; COPD; and Medicare Wellness   HPI [name] presents for a blood pressure check and follow-up on other medical problems. She tells me her blood pressure has not been adequately well controlled on the combination of carvedilol and hydrochlorothiazide. She has chronic, unchanged shortness of breath and nonproductive cough. She denies headache/blurred vision/chest pain/palpitations/edema/diaphoresis/or near syncope.  She tells me her back pain has improved some with the addition of gabapentin but she still needs a refill on oxycodone for ongoing symptom relief.  Outpatient Prescriptions Prior to Visit  Medication Sig Dispense Refill  . Albuterol Sulfate (PROAIR RESPICLICK) 878 (90 BASE) MCG/ACT AEPB Inhale 1 puff into the lungs 4 (four) times daily as needed (For shortness of breath.). 1 each 0  . ALPRAZolam (XANAX) 0.5 MG tablet Take 1 tablet (0.5 mg total) by mouth 3 (three) times daily as needed. 90 tablet 3  . aspirin EC 81 MG tablet Take 81 mg by mouth at bedtime.     [name] atorvastatin (LIPITOR) 40 MG tablet Take 1 tablet (40 mg total) by mouth daily at 6 PM. 30 tablet 11  . budesonide (PULMICORT) 0.5 MG/2ML nebulizer solution Take 2 mLs (0.5 mg total) by nebulization 2 (two) times daily. 60 mL 12  . carvedilol (COREG) 6.25 MG tablet Take 1 tablet (6.25 mg total) by mouth 2 (two) times daily with a meal. 60 tablet 5  . gabapentin (NEURONTIN) 300 MG capsule TAKE 1 CAPSULE BY MOUTH TWICE DAILY 60 capsule 3  . ipratropium-albuterol (DUONEB) 0.5-2.5 (3) MG/3ML SOLN Take 3 mLs by nebulization 3 (three) times daily. 360 mL 3  . potassium chloride SA (KLOR-CON M20) 20 MEQ tablet Take 1 tablet (20 mEq total) by mouth 2 (two) times daily. 60 tablet 2  . sodium chloride (OCEAN) 0.65 % SOLN nasal spray Place 1 spray into both nostrils as needed for  congestion. 480 mL 0  . triamcinolone ointment (KENALOG) 0.1 % Apply 1 application topically 2 (two) times daily. 30 g 0  . hydrochlorothiazide (MICROZIDE) 12.5 MG capsule Take 1 capsule (12.5 mg total) by mouth daily. 90 capsule 1  . oxyCODONE (ROXICODONE) 15 MG immediate release tablet Take 1 tablet (15 mg total) by mouth every 6 (six) hours as needed. For pain. 120 tablet 0   No facility-administered medications prior to visit.    ROS Review of Systems  Constitutional: Negative.  Negative for fever, chills, diaphoresis, appetite change, fatigue and unexpected weight change.  HENT: Negative.  Negative for facial swelling, sinus pressure, sore throat and trouble swallowing.   Eyes: Negative.  Negative for visual disturbance.  Respiratory: Positive for cough and shortness of breath. Negative for choking, chest tightness, wheezing and stridor.   Cardiovascular: Negative.  Negative for chest pain, palpitations and leg swelling.  Gastrointestinal: Negative.  Negative for nausea, vomiting, abdominal pain, diarrhea and constipation.  Endocrine: Negative.   Genitourinary: Negative.  Negative for dysuria, urgency, frequency, hematuria, decreased urine volume and difficulty urinating.  Musculoskeletal: Positive for back pain. Negative for myalgias, arthralgias, gait problem, neck pain and neck stiffness.  Skin: Negative.  Negative for color change and rash.  Allergic/Immunologic: Negative.   Neurological: Negative.  Negative for dizziness, tremors, weakness, light-headedness and numbness.  Hematological: Negative.  Negative for adenopathy. Does not bruise/bleed easily.  Psychiatric/Behavioral: Negative.     Objective:  BP 164/88 mmHg  Pulse 72  Temp(Src) 98.4 F (36.9 C) (Oral)  Resp 16  Ht _0  (1.676 m)  Wt 139 lb (63.05 kg)  BMI 22.45 kg/m2  SpO2 94%  LMP 10/05/1999  BP Readings from Last 3 Encounters:  02/26/16 164/88  01/27/16 124/92  12/02/15 150/94    Wt Readings from Last  3 Encounters:  02/26/16 139 lb (63.05 kg)  01/27/16 139 lb 12.8 oz (63.413 kg)  12/02/15 134 lb (60.782 kg)    Physical Exam  Constitutional: She is oriented to person, place, and time. No distress.  HENT:  Mouth/Throat: Oropharynx is clear and moist. No oropharyngeal exudate.  Eyes: Conjunctivae are normal. Right eye exhibits no discharge. Left eye exhibits no discharge. No scleral icterus.  Neck: Normal range of motion. Neck supple. No JVD present. No tracheal deviation present. No thyromegaly present.  Cardiovascular: Normal rate, regular rhythm, normal heart sounds and intact distal pulses.  Exam reveals no gallop and no friction rub.   No murmur heard. Pulmonary/Chest: Effort normal. No accessory muscle usage or stridor. No respiratory distress. She has no decreased breath sounds. She has no wheezes. She has rhonchi in the right upper field, the right middle field, the right lower field, the left upper field, the left middle field and the left lower field. She has no rales.  She has diffuse expiratory rhonchi but good air movement  Abdominal: Soft. Bowel sounds are normal. She exhibits no distension and no mass. There is no tenderness. There is no rebound and no guarding.  Musculoskeletal: Normal range of motion. She exhibits no edema or tenderness.  Lymphadenopathy:    She has no cervical adenopathy.  Neurological: She is oriented to person, place, and time.  Skin: Skin is warm and dry. No rash noted. She is not diaphoretic. No erythema. No pallor.  Psychiatric: She has a normal mood and affect. Her behavior is normal. Judgment and thought content normal.  Vitals reviewed.   Lab Results  Component Value Date   WBC 7.2 12/02/2015   HGB 15.3* 12/02/2015   HCT 46.5* 12/02/2015   PLT 201.0 12/02/2015   GLUCOSE 109* 02/26/2016   CHOL 93 02/26/2016   TRIG 52.0 02/26/2016   HDL 45.30 02/26/2016   LDLCALC 37 02/26/2016   ALT 9 12/02/2015   AST 15 12/02/2015   NA 130* 02/26/2016     K 3.9 02/26/2016   CL 94* 02/26/2016   CREATININE 0.65 02/26/2016   BUN 4* 02/26/2016   CO2 37* 02/26/2016   TSH 1.00 12/02/2015   INR 1.64* 03/27/2015   HGBA1C 6.3 12/02/2015   MICROALBUR 1.0 12/02/2015    Mm Digital Diagnostic Unilat R  06/24/2015  CLINICAL DATA:  Post ultrasound-guided biopsy of a mass in the upper-outer right breast at 11 o'clock 10 cm from the nipple. EXAM: DIAGNOSTIC RIGHT MAMMOGRAM POST ULTRASOUND BIOPSY COMPARISON:  Previous exam(s). FINDINGS: Mammographic images were obtained following ultrasound guided biopsy of the mass in the upper-outer right breast at 11 o'clock. A ribbon shaped biopsy marking clip is present in the mammographically stable mass, which appears unchanged since 03/05/2009. IMPRESSION: Ribbon shaped biopsy marking clip appropriately positioned post biopsy of a mass in the upper-outer right breast at 11 o'clock. Final Assessment: Post Procedure Mammograms for Marker Placement Electronically Signed   By: Everlean Alstrom M.D.   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 This was found to be concordant by Dr. Everlean Alstrom. Pathology results were discussed with the patient via telephone. The patient reported no problems with the biopsy site and is doing well. Post biopsy care and instructions were reviewed and questions were answered. The patient was encouraged to call [facility] with any additional questions and or concerns. A surgical referral was arranged with Dr. Autumn Messing of [facility] on July 07, 2015. Pathology results reported by Terie Purser RN on June 26, 2015. Electronically Signed   By: Everlean Alstrom M.D.   On: 06/26/2015 14:43  06/27/2015  CLINICAL DATA:  60 year old female with an indeterminate mass in the upper-outer  right breast at 11 o'clock. EXAM: ULTRASOUND GUIDED RIGHT BREAST CORE NEEDLE BIOPSY COMPARISON:  Previous exam(s). PROCEDURE: I met with the patient and we discussed the procedure of ultrasound-guided biopsy, including benefits and alternatives. We discussed the high likelihood of a successful procedure. We discussed the risks of the procedure including infection, bleeding, tissue injury, clip migration, and inadequate sampling. Informed written consent was given. The usual time-out protocol was performed immediately prior to the procedure. Using sterile technique and 2% Lidocaine as local anesthetic, under direct ultrasound visualization, a 12 gauge vacuum-assisted device was used to perform biopsy of the mass in the upper-outer right breast at 11 o'clockusing a lateral to medial approach. At the conclusion of the procedure, a ribbon shaped tissue marker clip was deployed into the biopsy cavity. Follow-up 2-view mammogram was performed and dictated separately. IMPRESSION: Ultrasound-guided biopsy of the mass in the upper-outer right breast at 11 o'clock. No apparent complications. Electronically Signed: By: Everlean Alstrom M.D. On: 06/24/2015 15:24    Assessment & Plan:   Syanne was seen today for follow-up, hypothyroidism, hypertension, copd and medicare wellness.  Diagnoses and all orders for this visit:  Coronary artery disease involving native coronary artery of native heart with unstable angina pectoris ([place])- she has had no recent episodes of chest pain or diaphoresis, she has achieved her LDL goal is doing well on statin therapy. -     Lipid panel; Future  COPD bronchitis  Essential hypertension, benign- her blood pressure is not adequately well controlled and she is troubled with hypokalemia so I have asked her to increase her diuretic therapy with the addition of triamterene to the hydrochlorothiazide regimen for better blood pressure control and to help her get episodes of  hypokalemia. -     triamterene-hydrochlorothiazide (DYAZIDE) 50-25 MG capsule; Take 1 capsule by mouth every morning. -     Magnesium; Future -     Basic metabolic panel; Future  Other specified hypothyroidism- TSH is in the normal range, we'll continue the current dose of levothyroxine  Hypokalemia- she is at ongoing risk for hypokalemia with the single use of hydrochlorothiazide so I have added triamterene to help raise her potassium level. -     triamterene-hydrochlorothiazide (DYAZIDE) 50-25 MG capsule; Take 1 capsule by mouth every morning. -     Magnesium; Future -     Basic metabolic panel; Future  Right lumbar radiculitis -     Discontinue: oxyCODONE (ROXICODONE) 15 MG immediate release tablet; Take 1 tablet (15 mg total) by mouth every 6 (six) hours as needed. For pain. -     Discontinue: oxyCODONE (ROXICODONE) 15 MG immediate release tablet; Take 1 tablet (15 mg total) by mouth every 6 (six) hours as needed. For pain. -     oxyCODONE (ROXICODONE)  15 MG immediate release tablet; Take 1 tablet (15 mg total) by mouth every 6 (six) hours as needed. For pain.   I have discontinued Ms. Kitagawa's hydrochlorothiazide. I am also having her start on triamterene-hydrochlorothiazide. Additionally, I am having her maintain her aspirin EC, Albuterol Sulfate, triamcinolone ointment, sodium chloride, atorvastatin, ipratropium-albuterol, ALPRAZolam, carvedilol, budesonide, gabapentin, potassium chloride SA, and oxyCODONE.  Meds ordered this encounter  Medications  . triamterene-hydrochlorothiazide (DYAZIDE) 50-25 MG capsule    Sig: Take 1 capsule by mouth every morning.    Dispense:  90 capsule    Refill:  1  . DISCONTD: oxyCODONE (ROXICODONE) 15 MG immediate release tablet    Sig: Take 1 tablet (15 mg total) by mouth every 6 (six) hours as needed. For pain.    Dispense:  120 tablet    Refill:  0    Fill on or after 02/26/16  . DISCONTD: oxyCODONE (ROXICODONE) 15 MG immediate release tablet     Sig: Take 1 tablet (15 mg total) by mouth every 6 (six) hours as needed. For pain.    Dispense:  120 tablet    Refill:  0    Fill on or after 03/28/16  . oxyCODONE (ROXICODONE) 15 MG immediate release tablet    Sig: Take 1 tablet (15 mg total) by mouth every 6 (six) hours as needed. For pain.    Dispense:  120 tablet    Refill:  0    Fill on or after 04/27/16     Follow-up: Return in about 3 months (around 05/28/2016).  [name], MD

## 2016-02-29 DIAGNOSIS — J449 Chronic obstructive pulmonary disease, unspecified: Secondary | ICD-10-CM | POA: Diagnosis not present

## 2016-03-11 ENCOUNTER — Telehealth: Payer: Self-pay

## 2016-03-11 NOTE — Telephone Encounter (Signed)
Fax to [facility] for education and assisting with adherence  to the medical plan; cannot afford inhalers; Difficulty with spouse and now keeping 62 yo grand child. Hopefully SW can assist with resources as assessed. The patient agreed to the intervention; Fax sent to [facility]

## 2016-03-15 ENCOUNTER — Other Ambulatory Visit: Payer: Self-pay

## 2016-03-15 NOTE — Patient Outreach (Signed)
[facility]) Care Management  03/15/2016  Phyllis [name] 1956-03-08 [id number]   Telephone call to patient regarding primary MD referral. Unable to get patient.  HIPAA compliant voice message left with call back phone number.   PLAN:  RNCM will attempt 2nd telephone call to patient within 2 weeks.  Quinn Plowman RN,BSN,CCM [facility] Telephonic  [phone]

## 2016-03-18 ENCOUNTER — Other Ambulatory Visit: Payer: Self-pay

## 2016-03-18 NOTE — Patient Outreach (Signed)
[facility]) Care Management  03/18/2016  Phyllis [name] [birth year] [id number]   Second telephone call to patient regarding primary MD referral. Unable to reach patient. HIPAA compliant voice message left with call back phone number.   PLAN: RNCM will attempt 3rd telephone outreach to patient within 2 weeks.   Quinn Plowman RN,BSN,CCM [facility] Telephonic  [phone]

## 2016-03-23 ENCOUNTER — Telehealth: Payer: Self-pay

## 2016-03-23 ENCOUNTER — Other Ambulatory Visit: Payer: Self-pay

## 2016-03-23 NOTE — Telephone Encounter (Signed)
Rec'd call from Dareen Piano, RN from [facility] that the patient is not responding Call the patient his am and left VM to call Manuela Schwartz at [phone].

## 2016-03-23 NOTE — Patient Outreach (Signed)
[facility]) Care Management  03/23/2016  Phyllis [name] Aug 23, 1956 [id number]  REFERRAL SOURCE; Primary MD office REFERRAL REASON:  Centrilobular emphysema, chronic respiratory failure with hypoxia, RN education , assistance with medication and management, social work follow up   [place] telephone outreach to patient regarding primary MD referral. Unable to reach patient. HIPAA compliant voice message left with call back phone number.  RNCM contact Wynetta Fines at patients primary MD office. Notified her of inability to establish contact with patient.  Requested assistance with engaging patient to [facility] care management program.  Manuela Schwartz stated she spoke with patient about the [facility] care management program.  Stated she would attempt contact with patient to assist with engagement. RNCM informed Manuela Schwartz that a letter would be sent to patient from [facility] care management to attempt contact.   PLAN:  RNCM will send patient [facility] care management outreach letter to attempt contact.   Quinn Plowman RN,BSN,CCM [facility] Telephonic  [phone]

## 2016-03-29 ENCOUNTER — Other Ambulatory Visit: Payer: Self-pay

## 2016-03-29 NOTE — Telephone Encounter (Signed)
Call to Phyllis [name]. Stated she was very busy and was not a good time for [facility] to come out. Would like to postpone for now.  She is having CT tomorrow and then will fup with pulmonary. Recommended she discuss her oxygen and potential for inc'd lung capacity with the pulmonologist at her next visit.

## 2016-03-29 NOTE — Patient Outreach (Signed)
[facility]) Care Management  03/29/2016  MANASI DISHON [birth year] [id number]   Received voice message from Wynetta Fines at patients primary MD office. Manuela Schwartz states she spoke with patient and states patient is busy at this time and it is not a good time for her to have [facility] care management services.   PLAN:  RNCM will refer patient to [facility] to close patient due to refusal of services. RNCM will notify patients primary MD office of closure.   Quinn Plowman RN,BSN,CCM [facility] Telephonic  [phone]

## 2016-03-30 ENCOUNTER — Ambulatory Visit (INDEPENDENT_AMBULATORY_CARE_PROVIDER_SITE_OTHER)
Admission: RE | Admit: 2016-03-30 | Discharge: 2016-03-30 | Disposition: A | Discharge status: Home / Self Care | Payer: Medicare Other | Source: Ambulatory Visit | Attending: Pulmonary Disease | Admitting: Pulmonary Disease

## 2016-03-30 DIAGNOSIS — R918 Other nonspecific abnormal finding of lung field: Secondary | ICD-10-CM | POA: Diagnosis not present

## 2016-03-30 DIAGNOSIS — R911 Solitary pulmonary nodule: Secondary | ICD-10-CM

## 2016-03-31 DIAGNOSIS — J449 Chronic obstructive pulmonary disease, unspecified: Secondary | ICD-10-CM | POA: Diagnosis not present

## 2016-04-15 ENCOUNTER — Telehealth: Payer: Self-pay | Admitting: Pulmonary Disease

## 2016-04-15 NOTE — Telephone Encounter (Signed)
CT chest 03/30/16 >> advanced emphysema, 2.1 cm nodule LLL, 1.1 cm RLL nodule, moderate cirrhosis, chronic pancreatitis   Will have my nurse schedule appointment with me next week to review CT results >> okay to double book visit.

## 2016-04-16 NOTE — Telephone Encounter (Signed)
LM x 1 for pt

## 2016-04-20 NOTE — Telephone Encounter (Signed)
Patient scheduled to see Dr. Halford Chessman on 05/05/2016, pt stated that she could not come next week, she has to wait until after the 1st of the month in order to pay the copay.  Patient aware to arrive 15 min early. Nothing further needed.

## 2016-04-22 ENCOUNTER — Other Ambulatory Visit: Payer: Self-pay | Admitting: Internal Medicine

## 2016-04-22 NOTE — Telephone Encounter (Signed)
Faxed script back to [facility].../lmb 

## 2016-04-30 DIAGNOSIS — J449 Chronic obstructive pulmonary disease, unspecified: Secondary | ICD-10-CM | POA: Diagnosis not present

## 2016-05-03 ENCOUNTER — Other Ambulatory Visit: Payer: Self-pay | Admitting: Internal Medicine

## 2016-05-03 DIAGNOSIS — I2510 Atherosclerotic heart disease of native coronary artery without angina pectoris: Secondary | ICD-10-CM

## 2016-05-04 ENCOUNTER — Telehealth: Payer: Self-pay | Admitting: Emergency Medicine

## 2016-05-04 ENCOUNTER — Other Ambulatory Visit: Payer: Self-pay | Admitting: Internal Medicine

## 2016-05-04 DIAGNOSIS — I10 Essential (primary) hypertension: Secondary | ICD-10-CM

## 2016-05-04 MED ORDER — TRIAMTERENE-HCTZ 37.5-25 MG PO TABS: 1.0000 | ORAL_TABLET | Freq: Every day | ORAL | 1 refills | Status: DC

## 2016-05-04 NOTE — Telephone Encounter (Signed)
Pt advised of Rx change

## 2016-05-04 NOTE — Telephone Encounter (Signed)
Pt called and they dont make triamterene-hydrochlorothiazide (DYAZIDE) 50-25 MG capsule in that strength anymore. Can she get another prescription. Pharmacy is [place]. Please follow up thanks.

## 2016-05-04 NOTE — Telephone Encounter (Signed)
changed

## 2016-05-05 ENCOUNTER — Encounter: Payer: Self-pay | Admitting: Pulmonary Disease

## 2016-05-05 ENCOUNTER — Ambulatory Visit (INDEPENDENT_AMBULATORY_CARE_PROVIDER_SITE_OTHER): Payer: Medicare Other | Admitting: Pulmonary Disease

## 2016-05-05 ENCOUNTER — Encounter (INDEPENDENT_AMBULATORY_CARE_PROVIDER_SITE_OTHER): Payer: Self-pay

## 2016-05-05 VITALS — BP 132/82 | HR 93 | Ht 66.0 in | Wt 139.6 lb

## 2016-05-05 DIAGNOSIS — J9611 Chronic respiratory failure with hypoxia: Secondary | ICD-10-CM | POA: Diagnosis not present

## 2016-05-05 DIAGNOSIS — J432 Centrilobular emphysema: Secondary | ICD-10-CM | POA: Diagnosis not present

## 2016-05-05 DIAGNOSIS — J441 Chronic obstructive pulmonary disease with (acute) exacerbation: Secondary | ICD-10-CM

## 2016-05-05 DIAGNOSIS — R911 Solitary pulmonary nodule: Secondary | ICD-10-CM

## 2016-05-05 MED ORDER — PREDNISONE 10 MG PO TABS: ORAL_TABLET | ORAL | 0 refills | Status: DC

## 2016-05-05 NOTE — Progress Notes (Signed)
. Current Outpatient Prescriptions on File Prior to Visit  Medication Sig  . Albuterol Sulfate (PROAIR RESPICLICK) 703 (90 BASE) MCG/ACT AEPB Inhale 1 puff into the lungs 4 (four) times daily as needed (For shortness of breath.).  [name] ALPRAZolam (XANAX) 0.5 MG tablet TAKE 1 TABLET BY MOUTH THREE TIMES DAILY AS NEEDED  . aspirin EC 81 MG tablet Take 81 mg by mouth at bedtime.   [name] atorvastatin (LIPITOR) 40 MG tablet TAKE 1 TABLET(40 MG) BY MOUTH DAILY 6 PM  . budesonide (PULMICORT) 0.5 MG/2ML nebulizer solution Take 2 mLs (0.5 mg total) by nebulization 2 (two) times daily.  . carvedilol (COREG) 6.25 MG tablet Take 1 tablet (6.25 mg total) by mouth 2 (two) times daily with a meal.  . gabapentin (NEURONTIN) 300 MG capsule TAKE 1 CAPSULE BY MOUTH TWICE DAILY  . ipratropium-albuterol (DUONEB) 0.5-2.5 (3) MG/3ML SOLN Take 3 mLs by nebulization 3 (three) times daily.  [name] oxyCODONE (ROXICODONE) 15 MG immediate release tablet Take 1 tablet (15 mg total) by mouth every 6 (six) hours as needed. For pain.  . potassium chloride SA (KLOR-CON M20) 20 MEQ tablet Take 1 tablet (20 mEq total) by mouth 2 (two) times daily.  . sodium chloride (OCEAN) 0.65 % SOLN nasal spray Place 1 spray into both nostrils as needed for congestion.  . triamcinolone ointment (KENALOG) 0.1 % Apply 1 application topically 2 (two) times daily.  [name] triamterene-hydrochlorothiazide (MAXZIDE-25) 37.5-25 MG tablet Take 1 tablet by mouth daily.   No current facility-administered medications on file prior to visit.     Chief Complaint  Patient presents with  . Follow-up    Review CT. Pt c/o cough and cold symptoms x 3 days with some chest congestion. Using Mucinex OTC PRN and neb meds as directed    Pulmonary tests CT of Chest 03/29/15 >> moderate centrilobular emphysema, 6 mm LLL nodule, small pericardial effusion, trace R pleural fluid, cirrhosis, chronic calcific pancreatitis V/Q scan 04/01/15 >> low probability for PE  A1AT 04/09/15 >>  170, MM PFT 04/25/15 >> FEV1 0.92 (44%), FEV1% 50, TLC 4.05 (80%), DLCO 22%, no BD CT chest 03/30/16 >> advanced emphysema, 2.1 cm nodule LLL, 1.1 cm RLL nodule, moderate cirrhosis, chronic pancreatitis  Cardiac tests Echo 03/28/15 >> EF 65 to 70%, PAH 67 mmHg  Past medical history GERD, Hepatitis C with cirrhosis, Chronic pancreatitis, HTN, Neuropathy, Hypothyroidism, Depression  Past surgical history, Family history, Social history, Allergies all reviewed  Vital signs BP 132/82 (BP Location: Left Arm, Cuff Size: Normal)   Pulse 93   Ht _0  (1.676 m)   Wt 139 lb 9.6 oz (63.3 kg)   LMP 10/05/1999   SpO2 92%   BMI 22.53 kg/m   History of Present Illness: Phyllis [name] is a 60 y.o. 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  She is here to reviewe her CT chest.  LLL nodule increased from 6 mm to 21 mm.  New nodule in RLL 11 mm.  She has noticed more trouble with her breathing from the warm weather.  She is having cough and some wheeze.  Denies chest pain, fever, or hemoptysis.   Physical Exam:  General - No distress, wearing oxygen ENT - No sinus tenderness, no oral exudate, no LAN Cardiac - s1s2 regular, no murmur Chest - No wheeze/rales/dullness Back - No focal tenderness Abd - Soft, non-tender Ext - No edema Neuro - Normal strength Skin - No rashes Psych - normal  mood, and behavior   Assessment/Plan:  COPD exacerbation related to environmental exposure. - will give course of prednisone - don't think she needs Abx at this time  Lt lung nodule has increased in size. - will arrange for PET scan - will arrange for referral to [place] [place] - might be able to consider empiric XRT >> she would be high risk for biopsy procedure  COPD with emphysema. - she as difficulty affording her mediations. - continue pulmicort, duoneb, and prn albuterol  Chronic respiratory failure with hypoxia. - continue 3 liters oxygen  24/7    Patient Instructions  Prednisone 10 mg pill >> 3 pills daily for 2 days, 2 pills daily for 2 days, 1 pill daily for 2 days  Will arrange for PET scan  Will arrange for referral to multi-disciplinary thoracic oncology conference [facility])  Follow up in 3 weeks with Dr. Halford Chessman or Nurse Practitioner    Chesley Mires, MD [place] 05/05/2016, 2:54 PM Pager:  [phone]

## 2016-05-05 NOTE — Patient Instructions (Signed)
Prednisone 10 mg pill >> 3 pills daily for 2 days, 2 pills daily for 2 days, 1 pill daily for 2 days  Will arrange for PET scan  Will arrange for referral to multi-disciplinary thoracic oncology conference [facility])  Follow up in 3 weeks with Dr. Halford Chessman or Nurse Practitioner

## 2016-05-10 ENCOUNTER — Telehealth: Payer: Self-pay | Admitting: *Deleted

## 2016-05-10 NOTE — Telephone Encounter (Signed)
Oncology Nurse Navigator Documentation Oncology Nurse Navigator Documentation  Oncology Nurse Navigator Flowsheets 05/10/2016  Navigator Encounter Type Introductory phone call;Telephone  Telephone Outgoing Call  Abnormal Finding Date 04/08/2016  Treatment Phase Pre-Tx/Tx Discussion  Barriers/Navigation Needs Coordination of Care  Interventions Coordination of Care  Coordination of Care Appts  Acuity Level 1  Acuity Level 1 Initial guidance, education and coordination as needed  Time Spent with Patient 30   I received referral from Dr. Juanetta Gosling office.  I called patient to set up for [place] on 05/20/16 arrive at 2:45.  She verbalized understanding of appt time and place.

## 2016-05-14 ENCOUNTER — Telehealth: Payer: Self-pay | Admitting: *Deleted

## 2016-05-14 ENCOUNTER — Encounter (HOSPITAL_COMMUNITY)
Admission: RE | Admit: 2016-05-14 | Discharge: 2016-05-14 | Disposition: A | Discharge status: Home / Self Care | Payer: Medicare Other | Source: Ambulatory Visit | Attending: Pulmonary Disease | Admitting: Pulmonary Disease

## 2016-05-14 DIAGNOSIS — R911 Solitary pulmonary nodule: Secondary | ICD-10-CM | POA: Insufficient documentation

## 2016-05-14 DIAGNOSIS — R918 Other nonspecific abnormal finding of lung field: Secondary | ICD-10-CM | POA: Diagnosis not present

## 2016-05-14 LAB — GLUCOSE, CAPILLARY: GLUCOSE-CAPILLARY: 156 mg/dL — AB (ref 65–99)

## 2016-05-14 MED ORDER — FLUDEOXYGLUCOSE F - 18 (FDG) INJECTION
7.0000 | Freq: Once | INTRAVENOUS | Status: AC | PRN
  Administered 2016-05-14: 7 via INTRAVENOUS

## 2016-05-14 NOTE — Telephone Encounter (Signed)
Mailed new pt clinic letter to pt.

## 2016-05-17 ENCOUNTER — Telehealth: Payer: Self-pay | Admitting: Pulmonary Disease

## 2016-05-17 ENCOUNTER — Other Ambulatory Visit (INDEPENDENT_AMBULATORY_CARE_PROVIDER_SITE_OTHER): Payer: Medicare Other

## 2016-05-17 ENCOUNTER — Ambulatory Visit (INDEPENDENT_AMBULATORY_CARE_PROVIDER_SITE_OTHER): Payer: Medicare Other | Admitting: Internal Medicine

## 2016-05-17 ENCOUNTER — Encounter: Payer: Self-pay | Admitting: Internal Medicine

## 2016-05-17 VITALS — BP 130/88 | HR 80 | Temp 98.1°F | Resp 16 | Ht 66.0 in | Wt 140.8 lb

## 2016-05-17 DIAGNOSIS — E038 Other specified hypothyroidism: Secondary | ICD-10-CM | POA: Diagnosis not present

## 2016-05-17 DIAGNOSIS — I10 Essential (primary) hypertension: Secondary | ICD-10-CM | POA: Diagnosis not present

## 2016-05-17 DIAGNOSIS — M5416 Radiculopathy, lumbar region: Secondary | ICD-10-CM

## 2016-05-17 DIAGNOSIS — M5417 Radiculopathy, lumbosacral region: Secondary | ICD-10-CM | POA: Diagnosis not present

## 2016-05-17 DIAGNOSIS — E118 Type 2 diabetes mellitus with unspecified complications: Secondary | ICD-10-CM

## 2016-05-17 DIAGNOSIS — F418 Other specified anxiety disorders: Secondary | ICD-10-CM

## 2016-05-17 LAB — BASIC METABOLIC PANEL
BUN: 6 mg/dL (ref 6–23)
CHLORIDE: 91 meq/L — AB (ref 96–112)
CO2: 35 meq/L — AB (ref 19–32)
Calcium: 9.1 mg/dL (ref 8.4–10.5)
Creatinine, Ser: 0.77 mg/dL (ref 0.40–1.20)
GFR: 97.74 mL/min (ref >60.00)
GLUCOSE: 101 mg/dL — AB (ref 70–99)
POTASSIUM: 4.5 meq/L (ref 3.5–5.1)
Sodium: 129 mEq/L — ABNORMAL LOW (ref 135–145)

## 2016-05-17 LAB — CBC WITH DIFFERENTIAL/PLATELET
BASOS ABS: 0 10*3/uL (ref 0.0–0.1)
Basophils Relative: 0.3 % (ref 0.0–3.0)
EOS ABS: 0 10*3/uL (ref 0.0–0.7)
Eosinophils Relative: 0.4 % (ref 0.0–5.0)
HEMATOCRIT: 38.7 % (ref 36.0–46.0)
HEMOGLOBIN: 12.7 g/dL (ref 12.0–15.0)
LYMPHS PCT: 22.5 % (ref 12.0–46.0)
Lymphs Abs: 1.9 10*3/uL (ref 0.7–4.0)
MCHC: 32.8 g/dL (ref 30.0–36.0)
MCV: 95.4 fl (ref 78.0–100.0)
MONOS PCT: 6.5 % (ref 3.0–12.0)
Monocytes Absolute: 0.6 10*3/uL (ref 0.1–1.0)
Neutro Abs: 6 10*3/uL (ref 1.4–7.7)
Neutrophils Relative %: 70.3 % (ref 43.0–77.0)
Platelets: 209 10*3/uL (ref 150.0–400.0)
RBC: 4.06 Mil/uL (ref 3.87–5.11)
RDW: 14.8 % (ref 11.5–15.5)
WBC: 8.5 10*3/uL (ref 4.0–10.5)

## 2016-05-17 LAB — HEMOGLOBIN A1C: Hgb A1c MFr Bld: 6.7 % — ABNORMAL HIGH (ref 4.6–6.5)

## 2016-05-17 LAB — TSH: TSH: 1.28 u[IU]/mL (ref 0.35–4.50)

## 2016-05-17 MED ORDER — OXYCODONE HCL 15 MG PO TABS: 15.0000 mg | ORAL_TABLET | Freq: Four times a day (QID) | ORAL | 0 refills | Status: DC | PRN

## 2016-05-17 MED ORDER — ALPRAZOLAM 0.5 MG PO TABS: 0.5000 mg | ORAL_TABLET | Freq: Three times a day (TID) | ORAL | 2 refills | Status: DC | PRN

## 2016-05-17 NOTE — Patient Instructions (Signed)
Hypertension Hypertension, commonly called high blood pressure, is when the force of blood pumping through your arteries is too strong. Your arteries are the blood vessels that carry blood from your heart throughout your body. A blood pressure reading consists of a higher number over a lower number, such as 110/72. The higher number (systolic) is the pressure inside your arteries when your heart pumps. The lower number (diastolic) is the pressure inside your arteries when your heart relaxes. Ideally you want your blood pressure below 120/80. Hypertension forces your heart to work harder to pump blood. Your arteries may become narrow or stiff. Having untreated or uncontrolled hypertension can cause heart attack, stroke, kidney disease, and other problems. RISK FACTORS Some risk factors for high blood pressure are controllable. Others are not.  Risk factors you cannot control include:   Race. You may be at higher risk if you are African American.  Age. Risk increases with age.  Gender. Men are at higher risk than women before age 50 years. After age 25, women are at higher risk than men. Risk factors you can control include:  Not getting enough exercise or physical activity.  Being overweight.  Getting too much fat, sugar, calories, or salt in your diet.  Drinking too much alcohol. SIGNS AND SYMPTOMS Hypertension does not usually cause signs or symptoms. Extremely high blood pressure (hypertensive crisis) may cause headache, anxiety, shortness of breath, and nosebleed. DIAGNOSIS To check if you have hypertension, your health care provider will measure your blood pressure while you are seated, with your arm held at the level of your heart. It should be measured at least twice using the same arm. Certain conditions can cause a difference in blood pressure between your right and left arms. A blood pressure reading that is higher than normal on one occasion does not mean that you need treatment. If  it is not clear whether you have high blood pressure, you may be asked to return on a different day to have your blood pressure checked again. Or, you may be asked to monitor your blood pressure at home for 1 or more weeks. TREATMENT Treating high blood pressure includes making lifestyle changes and possibly taking medicine. Living a healthy lifestyle can help lower high blood pressure. You may need to change some of your habits. Lifestyle changes may include:  Following the DASH diet. This diet is high in fruits, vegetables, and whole grains. It is low in salt, red meat, and added sugars.  Keep your sodium intake below 2,300 mg per day.  Getting at least 30-45 minutes of aerobic exercise at least 4 times per week.  Losing weight if necessary.  Not smoking.  Limiting alcoholic beverages.  Learning ways to reduce stress. Your health care provider may prescribe medicine if lifestyle changes are not enough to get your blood pressure under control, and if one of the following is true:  You are 62-65 years of age and your systolic blood pressure is above 140.  You are 6 years of age or older, and your systolic blood pressure is above 150.  Your diastolic blood pressure is above 90.  You have diabetes, and your systolic blood pressure is over 160 or your diastolic blood pressure is over 90.  You have kidney disease and your blood pressure is above 140/90.  You have heart disease and your blood pressure is above 140/90. Your personal target blood pressure may vary depending on your medical conditions, your age, and other factors. HOME CARE INSTRUCTIONS  Have your blood pressure rechecked as directed by your health care provider.   Take medicines only as directed by your health care provider. Follow the directions carefully. Blood pressure medicines must be taken as prescribed. The medicine does not work as well when you skip doses. Skipping doses also puts you at risk for  problems.  Do not smoke.   Monitor your blood pressure at home as directed by your health care provider. SEEK MEDICAL CARE IF:   You think you are having a reaction to medicines taken.  You have recurrent headaches or feel dizzy.  You have swelling in your ankles.  You have trouble with your vision. SEEK IMMEDIATE MEDICAL CARE IF:  You develop a severe headache or confusion.  You have unusual weakness, numbness, or feel faint.  You have severe chest or abdominal pain.  You vomit repeatedly.  You have trouble breathing. MAKE SURE YOU:   Understand these instructions.  Will watch your condition.  Will get help right away if you are not doing well or get worse.   This information is not intended to replace advice given to you by your health care provider. Make sure you discuss any questions you have with your health care provider.   Document Released: 09/20/2005 Document Revised: 02/04/2015 Document Reviewed: 07/13/2013 Elsevier Interactive Patient Education [employer].

## 2016-05-17 NOTE — Telephone Encounter (Signed)
PET scan 05/14/16 >> 1.5 cm RLL nodule 11.3 SUV, 1.7 cm LLL nodule 10.5 SUV, severe centrilobular emphysema, cirrhosis, chronic calcified pancreas, atherosclerosis   Results d/w pt.  She is scheduled for review at [facility] later this week.

## 2016-05-17 NOTE — Progress Notes (Signed)
Subjective:  Patient ID: Phyllis [name], female    DOB: 1956/08/31  Age: 60 y.o. MRN: [id number]  CC: Diabetes and Hypertension   HPI Phyllis [name] presents for follow-up on several medical problems. She recently had a PET scan performed and there is some concern that she may have lung cancer. She complains of persistent episodes of low back pain and needs a refill on oxycodone. The lung cancer concern has her feeling more anxious but she is holding herself together rather nicely and denies feeling worthless/helpless/hopeless/suicidal/or homicidal.  Outpatient Medications Prior to Visit  Medication Sig Dispense Refill  . Albuterol Sulfate (PROAIR RESPICLICK) 967 (90 BASE) MCG/ACT AEPB Inhale 1 puff into the lungs 4 (four) times daily as needed (For shortness of breath.). 1 each 0  . aspirin EC 81 MG tablet Take 81 mg by mouth at bedtime.     [name] atorvastatin (LIPITOR) 40 MG tablet TAKE 1 TABLET(40 MG) BY MOUTH DAILY 6 PM 30 tablet 11  . budesonide (PULMICORT) 0.5 MG/2ML nebulizer solution Take 2 mLs (0.5 mg total) by nebulization 2 (two) times daily. 60 mL 12  . carvedilol (COREG) 6.25 MG tablet Take 1 tablet (6.25 mg total) by mouth 2 (two) times daily with a meal. 60 tablet 5  . gabapentin (NEURONTIN) 300 MG capsule TAKE 1 CAPSULE BY MOUTH TWICE DAILY 60 capsule 3  . ipratropium-albuterol (DUONEB) 0.5-2.5 (3) MG/3ML SOLN Take 3 mLs by nebulization 3 (three) times daily. 360 mL 3  . potassium chloride SA (KLOR-CON M20) 20 MEQ tablet Take 1 tablet (20 mEq total) by mouth 2 (two) times daily. 60 tablet 2  . sodium chloride (OCEAN) 0.65 % SOLN nasal spray Place 1 spray into both nostrils as needed for congestion. 480 mL 0  . triamcinolone ointment (KENALOG) 0.1 % Apply 1 application topically 2 (two) times daily. 30 g 0  . triamterene-hydrochlorothiazide (MAXZIDE-25) 37.5-25 MG tablet Take 1 tablet by mouth daily. 90 tablet 1  . ALPRAZolam (XANAX) 0.5 MG tablet TAKE 1 TABLET BY MOUTH  THREE TIMES DAILY AS NEEDED 90 tablet 1  . oxyCODONE (ROXICODONE) 15 MG immediate release tablet Take 1 tablet (15 mg total) by mouth every 6 (six) hours as needed. For pain. 120 tablet 0  . predniSONE (DELTASONE) 10 MG tablet 3 pills daily for 2 days, 2 pills daily for 2 days, 1 pill daily for 2 days 12 tablet 0   No facility-administered medications prior to visit.     ROS Review of Systems  Constitutional: Negative.  Negative for activity change, appetite change, chills, diaphoresis, fatigue and fever.  HENT: Negative.   Eyes: Negative.  Negative for visual disturbance.  Respiratory: Positive for shortness of breath and wheezing. Negative for cough, choking, chest tightness and stridor.   Cardiovascular: Negative.  Negative for chest pain, palpitations and leg swelling.  Gastrointestinal: Negative.  Negative for abdominal pain, constipation, diarrhea, nausea and vomiting.  Endocrine: Negative for polydipsia, polyphagia and polyuria.  Genitourinary: Negative.   Musculoskeletal: Positive for back pain. Negative for joint swelling, myalgias and neck pain.  Skin: Negative.   Allergic/Immunologic: Negative.   Neurological: Negative.  Negative for dizziness.  Hematological: Negative.   Psychiatric/Behavioral: Negative for behavioral problems, confusion, decreased concentration, dysphoric mood, sleep disturbance and suicidal ideas. The patient is nervous/anxious.     Objective:  BP 130/88 (BP Location: Left Arm, Patient Position: Sitting, Cuff Size: Normal)   Pulse 80   Temp 98.1 F (36.7 C) (Oral)   Ht _0  (  1.676 m)   Wt 140 lb 12 oz (63.8 kg)   LMP 10/05/1999   SpO2 99%   BMI 22.72 kg/m   BP Readings from Last 3 Encounters:  05/17/16 130/88  05/05/16 132/82  02/26/16 (!) 164/88    Wt Readings from Last 3 Encounters:  05/17/16 140 lb 12 oz (63.8 kg)  05/05/16 139 lb 9.6 oz (63.3 kg)  02/26/16 139 lb (63 kg)    Physical Exam  Constitutional: She is oriented to person,  place, and time. No distress.  HENT:  Mouth/Throat: Oropharynx is clear and moist. No oropharyngeal exudate.  Eyes: Conjunctivae are normal. Right eye exhibits no discharge. Left eye exhibits no discharge. No scleral icterus.  Neck: Normal range of motion. Neck supple. No JVD present. No tracheal deviation present. No thyromegaly present.  Cardiovascular: Normal rate, regular rhythm, normal heart sounds and intact distal pulses.  Exam reveals no gallop and no friction rub.   No murmur heard. Pulmonary/Chest: Effort normal and breath sounds normal. No stridor. No respiratory distress. She has no wheezes. She has no rales. She exhibits no tenderness.  Abdominal: Soft. Bowel sounds are normal. She exhibits no distension and no mass. There is no tenderness. There is no rebound and no guarding.  Musculoskeletal: Normal range of motion. She exhibits no edema, tenderness or deformity.  Lymphadenopathy:    She has no cervical adenopathy.  Neurological: She is oriented to person, place, and time.  Skin: Skin is warm and dry. No rash noted. She is not diaphoretic. No erythema. No pallor.  Psychiatric: She has a normal mood and affect. Her behavior is normal. Judgment and thought content normal.  Vitals reviewed.   Lab Results  Component Value Date   WBC 8.5 05/17/2016   HGB 12.7 05/17/2016   HCT 38.7 05/17/2016   PLT 209.0 05/17/2016   GLUCOSE 101 (H) 05/17/2016   CHOL 93 02/26/2016   TRIG 52.0 02/26/2016   HDL 45.30 02/26/2016   LDLCALC 37 02/26/2016   ALT 9 12/02/2015   AST 15 12/02/2015   NA 129 (L) 05/17/2016   K 4.5 05/17/2016   CL 91 (L) 05/17/2016   CREATININE 0.77 05/17/2016   BUN 6 05/17/2016   CO2 35 (H) 05/17/2016   TSH 1.28 05/17/2016   INR 1.64 (H) 03/27/2015   HGBA1C 6.7 (H) 05/17/2016   MICROALBUR 1.0 12/02/2015    Nm Pet Image Initial (pi) Skull Base To Thigh  Result Date: 05/14/2016 CLINICAL DATA:  Initial treatment strategy for left lower lobe lung nodule and  right lower lobe lung nodule. EXAM: NUCLEAR MEDICINE PET SKULL BASE TO THIGH TECHNIQUE: 7.0 mCi F-18 FDG was injected intravenously. Full-ring PET imaging was performed from the skull base to thigh after the radiotracer. CT data was obtained and used for attenuation correction and anatomic localization. FASTING BLOOD GLUCOSE:  Value: 156 mg/dl COMPARISON:  Multiple exams, including chest CT of 03/30/2016 FINDINGS: NECK No hypermetabolic lymph nodes in the neck. Periapical lucency involving a right anterior mandibular tooth, possibly a canine, with faint hypermetabolic activity which is thought to be inflammatory. CHEST 1.5 by 1.1 cm right lower lobe pulmonary nodule image 46/8, maximum standard uptake value 11.3. Left lower lobe 1.6 by 1.7 cm pulmonary nodule with adjacent distal passive atelectasis, maximum standard uptake value 10.5. Left upper hilar node or nodule, maximum standard uptake value 5.6. Small lymph node below the bronchus intermedius, maximum standard uptake value 4.6. Severe centrilobular emphysema. Volume loss and some vascular prominence in the left  lower lobe. Bilateral airway thickening. ABDOMEN/PELVIS Physiologic activity in stomach and small and large bowel. No findings of metastatic disease to the abdomen. Lobular liver contour. Extensive calcification in the pancreas compatible chronic calcific pancreatitis. Aortoiliac atherosclerotic vascular disease. Right kidney lower pole hypodense lesion, photopenic, likely a cyst. SKELETON No focal hypermetabolic activity to suggest skeletal metastasis. IMPRESSION: 1. Abnormal hypermetabolic single pulmonary nodules in both lower lobes along with small hypermetabolic left upper hilar lymph node and faintly hypermetabolic lymph node just below the bronchus intermedius. Appearance favors malignancy, active granulomatous disease considered possible but less likely. Tissue diagnosis recommended. 2. Severe centrilobular emphysema. 3. Airway thickening is  present, suggesting bronchitis or reactive airways disease. 4. Other imaging findings of potential clinical significance: Lobular liver contour, possible cirrhosis. Chronic calcific pancreatitis. Aortoiliac atherosclerotic vascular disease. Right kidney lower pole cyst. Periapical lucency involving a right anterior mandibular tooth, possibly the canine. Electronically Signed   By: Van Clines M.D.   On: 05/14/2016 11:46    Assessment & Plan:   Hildred was seen today for diabetes and hypertension.  Diagnoses and all orders for this visit:  Other specified hypothyroidism- her TSH is in the normal range on no thyroid replacement therapy, she is euthyroid, will continue to observe for a recurrence of hypothyroidism. -     TSH; Future  Essential hypertension, benign- her blood pressure is well-controlled, electrolytes and renal function are stable. -     CBC with Differential/Platelet; Future -     Basic metabolic panel; Future  Type 2 diabetes mellitus with complication, without long-term current use of insulin ([place])- her A1c has risen up to 6.7%, no medications are needed at this time, will recheck her A1c in 4-6 months and advise further if indicated. -     Basic metabolic panel; Future -     Hemoglobin A1c; Future  Right lumbar radiculitis -     Discontinue: oxyCODONE (ROXICODONE) 15 MG immediate release tablet; Take 1 tablet (15 mg total) by mouth every 6 (six) hours as needed. For pain. -     Discontinue: oxyCODONE (ROXICODONE) 15 MG immediate release tablet; Take 1 tablet (15 mg total) by mouth every 6 (six) hours as needed. For pain. -     oxyCODONE (ROXICODONE) 15 MG immediate release tablet; Take 1 tablet (15 mg total) by mouth every 6 (six) hours as needed. For pain.  Depression with anxiety -     ALPRAZolam (XANAX) 0.5 MG tablet; Take 1 tablet (0.5 mg total) by mouth 3 (three) times daily as needed.   I have discontinued Ms. Golson's predniSONE. I have also changed her  ALPRAZolam. Additionally, I am having her maintain her aspirin EC, Albuterol Sulfate, triamcinolone ointment, sodium chloride, ipratropium-albuterol, carvedilol, budesonide, gabapentin, potassium chloride SA, atorvastatin, triamterene-hydrochlorothiazide, and oxyCODONE.  Meds ordered this encounter  Medications  . ALPRAZolam (XANAX) 0.5 MG tablet    Sig: Take 1 tablet (0.5 mg total) by mouth 3 (three) times daily as needed.    Dispense:  90 tablet    Refill:  2  . DISCONTD: oxyCODONE (ROXICODONE) 15 MG immediate release tablet    Sig: Take 1 tablet (15 mg total) by mouth every 6 (six) hours as needed. For pain.    Dispense:  120 tablet    Refill:  0    Fill on or after 05/27/16  . DISCONTD: oxyCODONE (ROXICODONE) 15 MG immediate release tablet    Sig: Take 1 tablet (15 mg total) by mouth every 6 (six) hours as needed.  For pain.    Dispense:  120 tablet    Refill:  0    Fill on or after 06/27/16  . oxyCODONE (ROXICODONE) 15 MG immediate release tablet    Sig: Take 1 tablet (15 mg total) by mouth every 6 (six) hours as needed. For pain.    Dispense:  120 tablet    Refill:  0    Fill on or after 07/27/16     Follow-up: Return in about 4 months (around 09/16/2016).  [name], MD

## 2016-05-17 NOTE — Progress Notes (Signed)
Pre visit review using our clinic review tool, if applicable. No additional management support is needed unless otherwise documented below in the visit note.

## 2016-05-20 ENCOUNTER — Ambulatory Visit: Payer: Medicare Other | Attending: Internal Medicine | Admitting: Physical Therapy

## 2016-05-20 ENCOUNTER — Ambulatory Visit
Admission: RE | Admit: 2016-05-20 | Discharge: 2016-05-20 | Disposition: A | Discharge status: Home / Self Care | Payer: Medicare Other | Source: Ambulatory Visit | Attending: Radiation Oncology | Admitting: Radiation Oncology

## 2016-05-20 ENCOUNTER — Ambulatory Visit (HOSPITAL_BASED_OUTPATIENT_CLINIC_OR_DEPARTMENT_OTHER): Payer: Medicare Other | Admitting: Internal Medicine

## 2016-05-20 ENCOUNTER — Encounter: Payer: Self-pay | Admitting: Radiation Oncology

## 2016-05-20 ENCOUNTER — Encounter: Payer: Self-pay | Admitting: *Deleted

## 2016-05-20 ENCOUNTER — Institutional Professional Consult (permissible substitution) (INDEPENDENT_AMBULATORY_CARE_PROVIDER_SITE_OTHER): Payer: Medicare Other | Admitting: Thoracic Surgery (Cardiothoracic Vascular Surgery)

## 2016-05-20 ENCOUNTER — Encounter: Payer: Self-pay | Admitting: Internal Medicine

## 2016-05-20 ENCOUNTER — Telehealth: Payer: Self-pay | Admitting: *Deleted

## 2016-05-20 ENCOUNTER — Encounter: Payer: Self-pay | Admitting: Thoracic Surgery (Cardiothoracic Vascular Surgery)

## 2016-05-20 VITALS — BP 152/78 | HR 95 | Temp 98.2°F | Resp 19 | Ht 66.0 in | Wt 138.8 lb

## 2016-05-20 DIAGNOSIS — J449 Chronic obstructive pulmonary disease, unspecified: Secondary | ICD-10-CM | POA: Diagnosis not present

## 2016-05-20 DIAGNOSIS — R2681 Unsteadiness on feet: Secondary | ICD-10-CM | POA: Diagnosis not present

## 2016-05-20 DIAGNOSIS — R918 Other nonspecific abnormal finding of lung field: Secondary | ICD-10-CM

## 2016-05-20 DIAGNOSIS — M5440 Lumbago with sciatica, unspecified side: Secondary | ICD-10-CM | POA: Diagnosis not present

## 2016-05-20 DIAGNOSIS — J984 Other disorders of lung: Secondary | ICD-10-CM

## 2016-05-20 DIAGNOSIS — I2723 Pulmonary hypertension due to lung diseases and hypoxia: Secondary | ICD-10-CM

## 2016-05-20 NOTE — Progress Notes (Signed)
Radiation Oncology         [phone] ________________________________  Multidisciplinary Thoracic Oncology Clinic [facility]) Initial Outpatient Consultation  Name: Phyllis [place] MRN: [id number]  Date: 05/20/2016  DOB: 1955/11/13  CC:[name], MD  Chesley Mires, MD   REFERRING PHYSICIAN: Chesley Mires, MD  DIAGNOSIS: 60 year-old woman with bilateral lower lobe masses suggestive of early stage non small cell lung cancer, pending biopsy    ICD-9-CM ICD-10-CM   1. Mass of lower lobe of left lung 786.6 J98.4   2. Mass of lower lobe of right lung 786.6 J98.4     HISTORY OF PRESENT ILLNESS:Phyllis [name] is a 60 y.o. female who is seen at the request of Dr. Halford Chessman for hypermetabolic pulmonary nodules. The patient has been followed with CT imaging after a pulmonary nodule was found a few years ago. She had a CT without contrast on 03/30/16 which revealed a 2.1 x 1.2 cm left lower lobe lesion which had increased in size since 2016 when it had been 6 mm. A new nodule in the right lower lobe measured 1.1 x 0.9 cm. She was counseled on a PET CT which was performed on 05/14/2016 revealed hypermetabolic uptake in the right lower lobe nodule with an SUV of 11.3, it measured 1.5 x 1.1 cm. The left lower lobe lesion measured 1.6 x 1.7 cm and had an SUV of 10.5. A left upper hilar node was present with a SUV of 5.6. A small lymph node below the bronchus intermedius with an SUV of 4.6  The patient was referred today for presentation in the multidisciplinary conference. Radiology studies were presented there for review and discussion of treatment options.  PREVIOUS RADIATION THERAPY: No  PAST MEDICAL HISTORY:  Past Medical History:  Diagnosis Date  . ASTHMA 06/06/2009  . BACK PAIN 08/06/2010  . Chronic pancreatitis ([place])   . COPD with emphysema ([place])   . DEPRESSION 06/06/2009   states she is not depressed  . Eczema   . Edema 05/20/2010  . GERD 06/06/2009  . HEPATITIS C WITHOUT HEPATIC COMA 06/06/2009    . HYPERTENSION 06/06/2009  . OSTEOPOROSIS 06/06/2009  . 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 TOBACCO USE 06/05/2010  . Unspecified hypothyroidism 06/05/2010  . VITAMIN D DEFICIENCY 06/06/2009     PAST SURGICAL HISTORY: Past Surgical History:  Procedure Laterality Date  . BREAST LUMPECTOMY     benign, right  . CHOLECYSTECTOMY    . LEFT HEART CATHETERIZATION WITH CORONARY ANGIOGRAM N/A 03/08/2013   Procedure: LEFT HEART CATHETERIZATION WITH CORONARY ANGIOGRAM;  Surgeon: Clent Demark, MD;  Location: [facility] CATH LAB;  Service: Cardiovascular;  Laterality: N/A;  . LUMBAR LAMINECTOMY    . TONSILLECTOMY    . TUBAL LIGATION      FAMILY HISTORY:  Family History  Problem Relation Age of Onset  . COPD Father   . Hypertension Mother   . Kidney disease Mother   . Colon cancer Neg Hx   . Stomach cancer Neg Hx     SOCIAL HISTORY:  reports that she quit smoking about 13 months ago. Her smoking use included Cigarettes. She has a 3.50 pack-year smoking history. She has never used smokeless tobacco. She reports that she does not drink alcohol or use drugs. The patient resides in [place]. She is married.  ALLERGIES: Amlodipine; Meperidine hcl; and Naproxen  MEDICATIONS:  Current Outpatient Prescriptions  Medication Sig Dispense Refill  . Albuterol Sulfate (PROAIR RESPICLICK) 956 (90 BASE) MCG/ACT AEPB Inhale 1  puff into the lungs 4 (four) times daily as needed (For shortness of breath.). 1 each 0  . ALPRAZolam (XANAX) 0.5 MG tablet Take 1 tablet (0.5 mg total) by mouth 3 (three) times daily as needed. 90 tablet 2  . aspirin EC 81 MG tablet Take 81 mg by mouth at bedtime.     [name] atorvastatin (LIPITOR) 40 MG tablet TAKE 1 TABLET(40 MG) BY MOUTH DAILY 6 PM 30 tablet 11  . budesonide (PULMICORT) 0.5 MG/2ML nebulizer solution Take 2 mLs (0.5 mg total) by nebulization 2 (two) times daily. 60 mL 12  . carvedilol (COREG) 6.25 MG tablet Take 1 tablet (6.25 mg total) by mouth 2 (two)  times daily with a meal. 60 tablet 5  . gabapentin (NEURONTIN) 300 MG capsule TAKE 1 CAPSULE BY MOUTH TWICE DAILY 60 capsule 3  . ipratropium-albuterol (DUONEB) 0.5-2.5 (3) MG/3ML SOLN Take 3 mLs by nebulization 3 (three) times daily. 360 mL 3  . oxyCODONE (ROXICODONE) 15 MG immediate release tablet Take 1 tablet (15 mg total) by mouth every 6 (six) hours as needed. For pain. 120 tablet 0  . potassium chloride SA (KLOR-CON M20) 20 MEQ tablet Take 1 tablet (20 mEq total) by mouth 2 (two) times daily. 60 tablet 2  . predniSONE (DELTASONE) 10 MG tablet   0  . sodium chloride (OCEAN) 0.65 % SOLN nasal spray Place 1 spray into both nostrils as needed for congestion. 480 mL 0  . triamcinolone ointment (KENALOG) 0.1 % Apply 1 application topically 2 (two) times daily. 30 g 0  . triamterene-hydrochlorothiazide (MAXZIDE-25) 37.5-25 MG tablet Take 1 tablet by mouth daily. 90 tablet 1   No current facility-administered medications for this encounter.     REVIEW OF SYSTEMS:  On review of systems, the patient reports that she is doing well overall despite her concerns about the current imaging findings. She does note shortness of breath with exertion. She denies any chest pain, cough, fevers, chills, night sweats, unintended weight changes. She denies any bowel or bladder disturbances, and denies abdominal pain, nausea or vomiting. She denies any new musculoskeletal or joint aches or pains. She has received Hepatitis C treatment. A complete review of systems is obtained and is otherwise negative.    PHYSICAL EXAM:    In general this is a well appearing African American female in no acute distress. 3-4 liters oxygen via nasal cannula. She's alert and oriented x4 and appropriate throughout the examination. Cardiopulmonary assessment is negative for acute distress and she exhibits normal effort.    KPS = 90  100 - Normal; no complaints; no evidence of disease. 90   - Able to carry on normal activity; minor  signs or symptoms of disease. 80   - Normal activity with effort; some signs or symptoms of disease. 42   - Cares for self; unable to carry on normal activity or to do active work. 60   - Requires occasional assistance, but is able to care for most of his personal needs. 50   - Requires considerable assistance and frequent medical care. 80   - Disabled; requires special care and assistance. 58   - Severely disabled; hospital admission is indicated although death not imminent. 65   - Very sick; hospital admission necessary; active supportive treatment necessary. 10   - Moribund; fatal processes progressing rapidly. 0     - Dead  Karnofsky DA, Abelmann WH, Craver LS and Burchenal [facility] [phone]) The use of the nitrogen mustards in the palliative  treatment of carcinoma: with particular reference to bronchogenic carcinoma Cancer 1 634-56  LABORATORY DATA:  Lab Results  Component Value Date   WBC 8.5 05/17/2016   HGB 12.7 05/17/2016   HCT 38.7 05/17/2016   MCV 95.4 05/17/2016   PLT 209.0 05/17/2016   Lab Results  Component Value Date   NA 129 (L) 05/17/2016   K 4.5 05/17/2016   CL 91 (L) 05/17/2016   CO2 35 (H) 05/17/2016   Lab Results  Component Value Date   ALT 9 12/02/2015   AST 15 12/02/2015   ALKPHOS 90 12/02/2015   BILITOT 0.5 12/02/2015    PULMONARY FUNCTION TEST:  FEV1 0.92 (44%), FEB1% 50, TLC 4.05 (80%) DLCO 22%, no Bronchodilator.   RADIOGRAPHY: Nm Pet Image Initial (pi) Skull Base To Thigh  Result Date: 05/14/2016 CLINICAL DATA:  Initial treatment strategy for left lower lobe lung nodule and right lower lobe lung nodule. EXAM: NUCLEAR MEDICINE PET SKULL BASE TO THIGH TECHNIQUE: 7.0 mCi F-18 FDG was injected intravenously. Full-ring PET imaging was performed from the skull base to thigh after the radiotracer. CT data was obtained and used for attenuation correction and anatomic localization. FASTING BLOOD GLUCOSE:  Value: 156 mg/dl COMPARISON:  Multiple exams, including  chest CT of 03/30/2016 FINDINGS: NECK No hypermetabolic lymph nodes in the neck. Periapical lucency involving a right anterior mandibular tooth, possibly a canine, with faint hypermetabolic activity which is thought to be inflammatory. CHEST 1.5 by 1.1 cm right lower lobe pulmonary nodule image 46/8, maximum standard uptake value 11.3. Left lower lobe 1.6 by 1.7 cm pulmonary nodule with adjacent distal passive atelectasis, maximum standard uptake value 10.5. Left upper hilar node or nodule, maximum standard uptake value 5.6. Small lymph node below the bronchus intermedius, maximum standard uptake value 4.6. Severe centrilobular emphysema. Volume loss and some vascular prominence in the left lower lobe. Bilateral airway thickening. ABDOMEN/PELVIS Physiologic activity in stomach and small and large bowel. No findings of metastatic disease to the abdomen. Lobular liver contour. Extensive calcification in the pancreas compatible chronic calcific pancreatitis. Aortoiliac atherosclerotic vascular disease. Right kidney lower pole hypodense lesion, photopenic, likely a cyst. SKELETON No focal hypermetabolic activity to suggest skeletal metastasis. IMPRESSION: 1. Abnormal hypermetabolic single pulmonary nodules in both lower lobes along with small hypermetabolic left upper hilar lymph node and faintly hypermetabolic lymph node just below the bronchus intermedius. Appearance favors malignancy, active granulomatous disease considered possible but less likely. Tissue diagnosis recommended. 2. Severe centrilobular emphysema. 3. Airway thickening is present, suggesting bronchitis or reactive airways disease. 4. Other imaging findings of potential clinical significance: Lobular liver contour, possible cirrhosis. Chronic calcific pancreatitis. Aortoiliac atherosclerotic vascular disease. Right kidney lower pole cyst. Periapical lucency involving a right anterior mandibular tooth, possibly the canine. Electronically Signed   By:  Van Clines M.D.   On: 05/14/2016 11:46      IMPRESSION/PLAN:  1. Hypermetabolic lung nodules. 2. COPD.  The patient is not an ideal surgical candidate.   I will see her back for follow up in about 2 weeks to discuss biopsy results and further discussion of stereotactic radiation treatment options.       This document serves as a record of services personally performed by [name], MD and Shona Simpson, PAC. It was created on his behalf by Arlyce Harman, a trained medical scribe. The creation of this record is based on the scribe's personal observations and the provider's statements to them. This document has been checked and approved by the attending  provider.

## 2016-05-20 NOTE — Telephone Encounter (Signed)
Oncology Nurse Navigator Documentation  Oncology Nurse Navigator Flowsheets 05/20/2016  Navigator Encounter Type Telephone  Telephone Outgoing Call/I called patient to check on her.  Her appt was for 2:00 with Dr. Julien Nordmann.  She stated she was almost here.  I updated Dr. Julien Nordmann  Treatment Phase Pre-Tx/Tx Discussion  Barriers/Navigation Needs Coordination of Care  Interventions Coordination of Care  Acuity Level 1  Acuity Level 1 Minimal follow up required  Time Spent with Patient 15

## 2016-05-20 NOTE — Progress Notes (Signed)
[facility] [phone]   [phone] Multidisciplinary thoracic oncology clinic  CONSULT NOTE  REFERRING PHYSICIAN: Dr. Chesley Mires  REASON FOR CONSULTATION:  60 years old African-American female with bilateral pulmonary nodules.  HPI Phyllis [name] is a 60 y.o. female with past medical history significant for hepatitis C status post treatment, depression, GERD, COPD, diabetes mellitus, chronic pancreatitis as well as congestive heart failure and long history of smoking but quit in 2016. The patient has been complaining of shortness breath and cough since 2016. She was followed by Dr. showed and CT scan of the chest on 03/31/2015 showed moderate centrilobular emphysema but there was also 0.6 cm left lower lobe nodule that was new compared to previous imaging studies. She was followed by observation and repeat CT scan of the chest on 03/30/2016 showed Pulmonary nodule within the left lower lobe measures 2.1 x 1.2 cm (mean diameter 1.7 cm). Previously this had a mean diameter of 6 mm. There is a new nodule within the right lower lobe which measures 1.1x 0.9 cm. A PET scan was performed on 05/14/2016 and it showed 1.5 by 1.1 cm right lower lobe pulmonary nodule, maximum standard uptake value 11.3. Left lower lobe 1.6 by 1.7 cm pulmonary nodule with adjacent distal passive atelectasis, maximum standard uptake value 10.5. Left upper hilar node or nodule, maximum standard uptake value 5.6. Small lymph node below the bronchus intermedius, maximum standard uptake value 4.6. The patient is a high risk for bronchoscopy with electromagnetic navigational bronchoscopy because of her comorbidities and significant COPD. Dr. Halford Chessman kindly referred the patient to the multidisciplinary thoracic oncology clinic today for further evaluation and recommendation regarding her condition. When seen today the patient continues to have shortness breath at baseline and currently on  oxygen 3 L/m. She also has cough productive of clear sputum but no significant chest pain or hemoptysis. She denied having any significant weight loss or night sweats. She has mild fatigue with no fever or chills. She denied having any headache or visual changes. Family history significant for father died from COPD, mother died from old age, brother had cancer of unknown type and paternal grandfather had prostate cancer. The patient is married and has 3 children. She was accompanied today by her husband, Napoleon. She has a history of smoking 1 pack per day for around 43 years and quit in 2016. She has no history of alcohol or drug abuse.   HPI  Past Medical History:  Diagnosis Date  . ASTHMA 06/06/2009  . BACK PAIN 08/06/2010  . Chronic pancreatitis ([place])   . COPD with emphysema ([place])   . DEPRESSION 06/06/2009   states she is not depressed  . Diabetes mellitus ([place])   . Eczema   . Edema 05/20/2010  . GERD 06/06/2009  . HEPATITIS C WITHOUT HEPATIC COMA 06/06/2009  . HYPERTENSION 06/06/2009  . OSTEOPOROSIS 06/06/2009  . 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 TOBACCO USE 06/05/2010  . Unspecified hypothyroidism 06/05/2010  . VITAMIN D DEFICIENCY 06/06/2009    Past Surgical History:  Procedure Laterality Date  . BREAST LUMPECTOMY     benign, right  . CHOLECYSTECTOMY    . LEFT HEART CATHETERIZATION WITH CORONARY ANGIOGRAM N/A 03/08/2013   Procedure: LEFT HEART CATHETERIZATION WITH CORONARY ANGIOGRAM;  Surgeon: Clent Demark, MD;  Location: [facility] CATH LAB;  Service: Cardiovascular;  Laterality: N/A;  . LUMBAR LAMINECTOMY    . TONSILLECTOMY    . TUBAL LIGATION  Family History  Problem Relation Age of Onset  . COPD Father   . Hypertension Mother   . Kidney disease Mother   . Colon cancer Neg Hx   . Stomach cancer Neg Hx     Social History Social History  Substance Use Topics  . Smoking status: Former Smoker    Packs/day: 0.10    Years: 35.00    Types: Cigarettes      Quit date: 04/03/2015  . Smokeless tobacco: Never Used     Comment: Trying to quit;   . Alcohol use No    Allergies  Allergen Reactions  . Amlodipine Other (See Comments)    EDEMA  . Meperidine Hcl Other (See Comments)    hallucinations  . Naproxen Other (See Comments)    Extreme bruising    Current Outpatient Prescriptions  Medication Sig Dispense Refill  . Albuterol Sulfate (PROAIR RESPICLICK) 841 (90 BASE) MCG/ACT AEPB Inhale 1 puff into the lungs 4 (four) times daily as needed (For shortness of breath.). 1 each 0  . ALPRAZolam (XANAX) 0.5 MG tablet Take 1 tablet (0.5 mg total) by mouth 3 (three) times daily as needed. 90 tablet 2  . aspirin EC 81 MG tablet Take 81 mg by mouth at bedtime.     [name] atorvastatin (LIPITOR) 40 MG tablet TAKE 1 TABLET(40 MG) BY MOUTH DAILY 6 PM 30 tablet 11  . budesonide (PULMICORT) 0.5 MG/2ML nebulizer solution Take 2 mLs (0.5 mg total) by nebulization 2 (two) times daily. 60 mL 12  . carvedilol (COREG) 6.25 MG tablet Take 1 tablet (6.25 mg total) by mouth 2 (two) times daily with a meal. 60 tablet 5  . gabapentin (NEURONTIN) 300 MG capsule TAKE 1 CAPSULE BY MOUTH TWICE DAILY 60 capsule 3  . ipratropium-albuterol (DUONEB) 0.5-2.5 (3) MG/3ML SOLN Take 3 mLs by nebulization 3 (three) times daily. 360 mL 3  . oxyCODONE (ROXICODONE) 15 MG immediate release tablet Take 1 tablet (15 mg total) by mouth every 6 (six) hours as needed. For pain. 120 tablet 0  . potassium chloride SA (KLOR-CON M20) 20 MEQ tablet Take 1 tablet (20 mEq total) by mouth 2 (two) times daily. 60 tablet 2  . sodium chloride (OCEAN) 0.65 % SOLN nasal spray Place 1 spray into both nostrils as needed for congestion. 480 mL 0  . triamcinolone ointment (KENALOG) 0.1 % Apply 1 application topically 2 (two) times daily. 30 g 0  . triamterene-hydrochlorothiazide (MAXZIDE-25) 37.5-25 MG tablet Take 1 tablet by mouth daily. 90 tablet 1  . predniSONE (DELTASONE) 10 MG tablet   0   No current  facility-administered medications for this visit.     Review of Systems  Constitutional: positive for fatigue Eyes: negative Ears, nose, mouth, throat, and face: negative Respiratory: positive for cough, dyspnea on exertion and sputum Cardiovascular: negative Gastrointestinal: negative Genitourinary:negative Integument/breast: negative Hematologic/lymphatic: negative Musculoskeletal:negative Neurological: negative Behavioral/Psych: negative Endocrine: negative Allergic/Immunologic: negative  Physical Exam  YSA:YTKZS, healthy, no distress, well nourished and well developed SKIN: skin color, texture, turgor are normal, no rashes or significant lesions HEAD: Normocephalic, No masses, lesions, tenderness or abnormalities EYES: normal, PERRLA, Conjunctiva are pink and non-injected EARS: External ears normal, Canals clear OROPHARYNX:no exudate, no erythema and lips, buccal mucosa, and tongue normal  NECK: supple, no adenopathy, no JVD LYMPH:  no palpable lymphadenopathy, no hepatosplenomegaly BREAST:not examined LUNGS: expiratory wheezes bilaterally HEART: regular rate & rhythm, no murmurs and no gallops ABDOMEN:abdomen soft, non-tender, normal bowel sounds and no masses or organomegaly  BACK: Back symmetric, no curvature., No CVA tenderness EXTREMITIES:no joint deformities, effusion, or inflammation, no edema, no skin discoloration  NEURO: alert & oriented x 3 with fluent speech, no focal motor/sensory deficits  PERFORMANCE STATUS: ECOG 1  LABORATORY DATA: Lab Results  Component Value Date   WBC 8.5 05/17/2016   HGB 12.7 05/17/2016   HCT 38.7 05/17/2016   MCV 95.4 05/17/2016   PLT 209.0 05/17/2016      Chemistry      Component Value Date/Time   NA 129 (L) 05/17/2016 1450   K 4.5 05/17/2016 1450   CL 91 (L) 05/17/2016 1450   CO2 35 (H) 05/17/2016 1450   BUN 6 05/17/2016 1450   CREATININE 0.77 05/17/2016 1450      Component Value Date/Time   CALCIUM 9.1 05/17/2016  1450   ALKPHOS 90 12/02/2015 1348   AST 15 12/02/2015 1348   ALT 9 12/02/2015 1348   BILITOT 0.5 12/02/2015 1348       RADIOGRAPHIC STUDIES: Nm Pet Image Initial (pi) Skull Base To Thigh  Result Date: 05/14/2016 CLINICAL DATA:  Initial treatment strategy for left lower lobe lung nodule and right lower lobe lung nodule. EXAM: NUCLEAR MEDICINE PET SKULL BASE TO THIGH TECHNIQUE: 7.0 mCi F-18 FDG was injected intravenously. Full-ring PET imaging was performed from the skull base to thigh after the radiotracer. CT data was obtained and used for attenuation correction and anatomic localization. FASTING BLOOD GLUCOSE:  Value: 156 mg/dl COMPARISON:  Multiple exams, including chest CT of 03/30/2016 FINDINGS: NECK No hypermetabolic lymph nodes in the neck. Periapical lucency involving a right anterior mandibular tooth, possibly a canine, with faint hypermetabolic activity which is thought to be inflammatory. CHEST 1.5 by 1.1 cm right lower lobe pulmonary nodule image 46/8, maximum standard uptake value 11.3. Left lower lobe 1.6 by 1.7 cm pulmonary nodule with adjacent distal passive atelectasis, maximum standard uptake value 10.5. Left upper hilar node or nodule, maximum standard uptake value 5.6. Small lymph node below the bronchus intermedius, maximum standard uptake value 4.6. Severe centrilobular emphysema. Volume loss and some vascular prominence in the left lower lobe. Bilateral airway thickening. ABDOMEN/PELVIS Physiologic activity in stomach and small and large bowel. No findings of metastatic disease to the abdomen. Lobular liver contour. Extensive calcification in the pancreas compatible chronic calcific pancreatitis. Aortoiliac atherosclerotic vascular disease. Right kidney lower pole hypodense lesion, photopenic, likely a cyst. SKELETON No focal hypermetabolic activity to suggest skeletal metastasis. IMPRESSION: 1. Abnormal hypermetabolic single pulmonary nodules in both lower lobes along with small  hypermetabolic left upper hilar lymph node and faintly hypermetabolic lymph node just below the bronchus intermedius. Appearance favors malignancy, active granulomatous disease considered possible but less likely. Tissue diagnosis recommended. 2. Severe centrilobular emphysema. 3. Airway thickening is present, suggesting bronchitis or reactive airways disease. 4. Other imaging findings of potential clinical significance: Lobular liver contour, possible cirrhosis. Chronic calcific pancreatitis. Aortoiliac atherosclerotic vascular disease. Right kidney lower pole cyst. Periapical lucency involving a right anterior mandibular tooth, possibly the canine. Electronically Signed   By: Van Clines M.D.   On: 05/14/2016 11:46    ASSESSMENT: This is a very pleasant 60 years old African-American female with questionable synchronous stage IA (T1a, N0, M0) non-small cell lung cancer presented with bilateral pulmonary nodules in the left lower lobe as well as the right lower lobe. The patient also has questionable left hilar lymphadenopathy. No tissue diagnosis yet. She is not a good candidate for bronchoscopy with electromagnetic navigational bronchoscopy because of CVA  COPD and comorbidities.   PLAN: I had a lengthy discussion with the patient and her husband today about her current disease status and further investigation to confirm diagnosis as well as treatment options. I recommended for the patient to proceed with CT-guided core biopsy of the left lower lobe pulmonary nodule by interventional radiology. Her case was discussed earlier today at the weekly thoracic conference with Dr. Willeen Cass from interventional radiology. I will also complete her staging workup by ordering a MRI of the brain to rule out brain metastasis. The patient would be seen later today for evaluation by Dr. Tammi Klippel for consideration of stereotactic radiotherapy to the bilateral pulmonary nodules. We will continue to monitor the left hilar  adenopathy closely on upcoming scan. I will see the patient back for follow-up visit in 4 months for reevaluation with repeat CT scan of the chest for restaging of her disease. The patient was seen during the multidisciplinary thoracic oncology clinic today by medical oncology, radiation oncology, thoracic surgery, thoracic navigator, social worker and physical therapist. She was advised to call immediately if she has any concerning symptoms in the interval.  The patient voices understanding of current disease status and treatment options and is in agreement with the current care plan.  All questions were answered. The patient knows to call the clinic with any problems, questions or concerns. We can certainly see the patient much sooner if necessary.  Thank you so much for allowing me to participate in the care of [place]. I will continue to follow up the patient with you and assist in her care.  I spent 40 minutes counseling the patient face to face. The total time spent in the appointment was 60 minutes.  Disclaimer: This note was dictated with voice recognition software. Similar sounding words can inadvertently be transcribed and may not be corrected upon review.   Parnika Tweten K. May 20, 2016, 3:43 PM

## 2016-05-20 NOTE — Therapy (Signed)
[place], [place], [address] Phone: [phone]   Fax:  [phone]  Physical Therapy Evaluation  Patient Details  Name: Phyllis [name] MRN: [id number] Date of Birth: [birth year] Referring Provider: Dr. Curt Bears  Encounter Date: 05/20/2016      PT End of Session - 05/20/16 1656    Visit Number 1   Number of Visits 1   PT Start Time 4259   PT Stop Time 1602   PT Time Calculation (min) 22 min   Activity Tolerance Patient tolerated treatment well   Behavior During Therapy [facility] for tasks assessed/performed      Past Medical History:  Diagnosis Date  . ASTHMA 06/06/2009  . BACK PAIN 08/06/2010  . Chronic pancreatitis ([place])   . COPD with emphysema ([employer])   . DEPRESSION 06/06/2009   states she is not depressed  . Diabetes mellitus ([place])   . Eczema   . Edema 05/20/2010  . GERD 06/06/2009  . HEPATITIS C WITHOUT HEPATIC COMA 06/06/2009  . HYPERTENSION 06/06/2009  . OSTEOPOROSIS 06/06/2009  . 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 TOBACCO USE 06/05/2010  . Unspecified hypothyroidism 06/05/2010  . VITAMIN D DEFICIENCY 06/06/2009    Past Surgical History:  Procedure Laterality Date  . BREAST LUMPECTOMY     benign, right  . CHOLECYSTECTOMY    . LEFT HEART CATHETERIZATION WITH CORONARY ANGIOGRAM N/A 03/08/2013   Procedure: LEFT HEART CATHETERIZATION WITH CORONARY ANGIOGRAM;  Surgeon: Clent Demark, MD;  Location: [facility] CATH LAB;  Service: Cardiovascular;  Laterality: N/A;  . LUMBAR LAMINECTOMY    . TONSILLECTOMY    . TUBAL LIGATION      There were no vitals filed for this visit.       Subjective Assessment - 05/20/16 1640    Subjective Has low back and bilateral leg pain   Patient is accompained by: Family member  husband and young grandson   Pertinent History Presented with cough and cold-like symptoms; work-up showed left pulmonary nodule.  Has had PET that showed left lower  lobe and right lower lobe nodules as well as hypermetabolic hilar lymph nodes.  No tissue diagnosis yet, but looks like stage III or IV lung cancer.  Expected to have a needle biopsy, then possible radiation and/or chemotherapy.  Ex-smoker quit 2016; on home O2; pulmonary hypertension, positive for hepatitis C; h/o breast lumpectomy for benign tumor; back pain, osteoporosis, neuropathy, heart catheterization 03/08/2013.  h/o 2 back surgeries (diskectomy and fusion, about 2001).   Patient Stated Goals get info from all lung clinic providers   Currently in Pain? Yes   Pain Score 2   up to 9 at times   Pain Location Back  and both legs   Pain Orientation Lower   Pain Descriptors / Indicators Other (Comment)  pain   Pain Type Chronic pain   Pain Radiating Towards both legs   Pain Onset More than a month ago   Aggravating Factors  cold and rain, certain activities   Pain Relieving Factors pain meds            [facility] PT Assessment - 05/20/16 0001      Assessment   Medical Diagnosis right and left lower lobe nodules   Referring Provider Dr. Curt Bears   Onset Date/Surgical Date --  June 2017     Precautions   Precautions Other (comment)   Precaution Comments on 3L O2 most of the time; cancer precautions; osteoporosis  Restrictions   Weight Bearing Restrictions No     Balance Screen   Has the patient fallen in the past 6 months No   Has the patient had a decrease in activity level because of a fear of falling?  No   Is the patient reluctant to leave their home because of a fear of falling?  No     Home Ecologist residence   Living Arrangements Spouse/significant other   Type of [place] One level     Prior Function   Level of Independence Independent   Leisure no regular exercise     Cognition   Overall Cognitive Status Within Functional Limits for tasks assessed     Observation/Other Assessments   Observations woman  with O2 by nasal cannula     Functional Tests   Functional tests Sit to Stand     Sit to Stand   Comments 6 times in 30 seconds, below average for age  this increased back pain to 7-8/10     Posture/Postural Control   Posture/Postural Control No significant limitations     ROM / Strength   AROM / PROM / Strength AROM     AROM   Overall AROM Comments standing trunk AROM WFL all motions except extension limited 25% and painful  patient also had one loss of balance during this     Ambulation/Gait   Ambulation/Gait Yes   Ambulation/Gait Assistance 6: Modified independent (Device/Increase time)   Assistive device Other (Comment)  O2 most of the time     Balance   Balance Assessed Yes     Dynamic Standing Balance   Dynamic Standing - Comments reaches forward 15 inches in standing, above average for age, but patient lost balance during standing trunk AROM assessment                           PT Education - 05/20/16 1656    Education provided Yes   Education Details posture, breathing, energy conservation, walking, Cure article on staying active, PT info   Person(s) Educated Patient;Spouse   Methods Explanation;Handout   Comprehension Verbalized understanding               Lung Clinic Goals - 05/20/16 1705      Patient will be able to verbalize understanding of the benefit of exercise to decrease fatigue.   Status Achieved     Patient will be able to verbalize the importance of posture.   Status Achieved     Patient will be able to demonstrate diaphragmatic breathing for improved lung function.   Status Achieved     Patient will be able to verbalize understanding of the role of physical therapy to prevent functional decline and who to contact if physical therapy is needed.   Status Achieved             Plan - 05/20/16 1657    Clinical Impression Statement Patient's case was discussed at multidisciplinary conference prior to evaluation.   This is a 60 year old woman with multiple medical conditions including being on home O2, pulmonary hypertension, hepatitis C, h/o back surgeries and with ongoing back pain, and now diagnosed with lung nodules in both lungs and lymph nodes that are hypermetabolic on PET so with probable stage III or IV lung cancer.  She his mildly limited trunk ROM with pain on extension; her 30 second sit to  stand was only 6 times and increased her back pain.  She did well on forward reach in standing but had a loss of balance while demonstrating standing trunk ROM.   Rehab Potential Fair   PT Frequency One time visit   PT Treatment/Interventions Patient/family education   PT Next Visit Plan None at this time; patient may benefit from physical therapy or pulmonary rehab going forward depending on treatment plan and tolerance.   PT Home Exercise Plan walking, breathing exercises   Consulted and Agree with Plan of Care Patient      Patient will benefit from skilled therapeutic intervention in order to improve the following deficits and impairments:  Cardiopulmonary status limiting activity, Decreased balance, Decreased range of motion, Pain  Visit Diagnosis: Unsteadiness on feet - Plan: PT plan of care cert/re-cert  Midline low back pain with sciatica, sciatica laterality unspecified - Plan: PT plan of care cert/re-cert      G-Codes - 73/53/29 1706    Functional Assessment Tool Used clinical judgement   Functional Limitation Mobility: Walking and moving around   Mobility: Walking and Moving Around Current Status [id number]) At least 20 percent but less than 40 percent impaired, limited or restricted   Mobility: Walking and Moving Around Goal Status [phone]) At least 20 percent but less than 40 percent impaired, limited or restricted   Mobility: Walking and Moving Around Discharge Status [phone]) At least 20 percent but less than 40 percent impaired, limited or restricted       Problem List Patient Active  Problem List   Diagnosis Date Noted  . Mass of lower lobe of left lung 05/20/2016  . Mass of lower lobe of right lung 05/20/2016  . Type II diabetes mellitus with manifestations ([place]) 04/08/2015  . CHF NYHA class I ([place]) 04/08/2015  . Pulmonary hypertension due to COPD ([place])   . COPD bronchitis 09/30/2014  . Visit for screening mammogram 09/30/2014  . Routine general medical examination at a health care facility 09/30/2014  . CAD (coronary artery disease), native coronary artery 03/05/2013  . Right lumbar radiculitis 08/06/2010  . Hypothyroidism 06/05/2010  . TOBACCO USE 06/05/2010  . Hep C w/o coma, chronic ([place]) 06/06/2009  . Depression with anxiety 06/06/2009  . Essential hypertension, benign 06/06/2009  . GERD 06/06/2009    Henreitta Spittler 05/20/2016, 5:14 PM  [place], [place], [address] Phone: [phone]   Fax:  [phone]  Name: NAVEA WOODROW MRN: [id number] Date of Birth: 04-05-1956  Serafina Royals, PT 05/20/16 5:14 PM

## 2016-05-20 NOTE — Progress Notes (Signed)
PCP is Phyllis Calico, MD Referring Provider is Phyllis Mires, MD  No chief complaint on file.   HPI: 60 yo woman with history of tobacco abuse, COPD, pulmonary hypertension and a left lower lobe lung nodule. She has been followed by Dr. Halford [name] for a left lower lobe lung nodule. Recently she had a follow up scan which showed the LLL nodule was larger and there was a new right lower lobe nodule. On PET both were hypermetabolic.  She uses 3L [place] O2 at home. She says her breathing is "pretty good" right now. Has not had any recent wheezing. She denies F,C,S, change in appetite, weight loss, unusual headaches or visual changes.  Past Medical History:  Diagnosis Date  . ASTHMA 06/06/2009  . BACK PAIN 08/06/2010  . Chronic pancreatitis ([place])   . COPD with emphysema ([place])   . DEPRESSION 06/06/2009   states she is not depressed  . Eczema   . Edema 05/20/2010  . GERD 06/06/2009  . HEPATITIS C WITHOUT HEPATIC COMA 06/06/2009  . HYPERTENSION 06/06/2009  . OSTEOPOROSIS 06/06/2009  . 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 TOBACCO USE 06/05/2010  . Unspecified hypothyroidism 06/05/2010  . VITAMIN D DEFICIENCY 06/06/2009    Past Surgical History:  Procedure Laterality Date  . BREAST LUMPECTOMY     benign, right  . CHOLECYSTECTOMY    . LEFT HEART CATHETERIZATION WITH CORONARY ANGIOGRAM N/A 03/08/2013   Procedure: LEFT HEART CATHETERIZATION WITH CORONARY ANGIOGRAM;  Surgeon: Phyllis Demark, MD;  Location: [facility] CATH LAB;  Service: Cardiovascular;  Laterality: N/A;  . LUMBAR LAMINECTOMY    . TONSILLECTOMY    . TUBAL LIGATION      Family History  Problem Relation Age of Onset  . COPD Father   . Hypertension Mother   . Kidney disease Mother   . Colon cancer Neg Hx   . Stomach cancer Neg Hx     Social History Social History  Substance Use Topics  . Smoking status: Former Smoker    Packs/day: 0.10    Years: 35.00    Types: Cigarettes    Quit date: 04/03/2015  . Smokeless tobacco: Never  Used     Comment: Trying to quit;   . Alcohol use No    Current Outpatient Prescriptions  Medication Sig Dispense Refill  . Albuterol Sulfate (PROAIR RESPICLICK) 409 (90 BASE) MCG/ACT AEPB Inhale 1 puff into the lungs 4 (four) times daily as needed (For shortness of breath.). 1 each 0  . ALPRAZolam (XANAX) 0.5 MG tablet Take 1 tablet (0.5 mg total) by mouth 3 (three) times daily as needed. 90 tablet 2  . aspirin EC 81 MG tablet Take 81 mg by mouth at bedtime.     [name] atorvastatin (LIPITOR) 40 MG tablet TAKE 1 TABLET(40 MG) BY MOUTH DAILY 6 PM 30 tablet 11  . budesonide (PULMICORT) 0.5 MG/2ML nebulizer solution Take 2 mLs (0.5 mg total) by nebulization 2 (two) times daily. 60 mL 12  . carvedilol (COREG) 6.25 MG tablet Take 1 tablet (6.25 mg total) by mouth 2 (two) times daily with a meal. 60 tablet 5  . gabapentin (NEURONTIN) 300 MG capsule TAKE 1 CAPSULE BY MOUTH TWICE DAILY 60 capsule 3  . ipratropium-albuterol (DUONEB) 0.5-2.5 (3) MG/3ML SOLN Take 3 mLs by nebulization 3 (three) times daily. 360 mL 3  . oxyCODONE (ROXICODONE) 15 MG immediate release tablet Take 1 tablet (15 mg total) by mouth every 6 (six) hours as needed. For pain. 120 tablet 0  .  potassium chloride SA (KLOR-CON M20) 20 MEQ tablet Take 1 tablet (20 mEq total) by mouth 2 (two) times daily. 60 tablet 2  . predniSONE (DELTASONE) 10 MG tablet   0  . sodium chloride (OCEAN) 0.65 % SOLN nasal spray Place 1 spray into both nostrils as needed for congestion. 480 mL 0  . triamcinolone ointment (KENALOG) 0.1 % Apply 1 application topically 2 (two) times daily. 30 g 0  . triamterene-hydrochlorothiazide (MAXZIDE-25) 37.5-25 MG tablet Take 1 tablet by mouth daily. 90 tablet 1   No current facility-administered medications for this visit.     Allergies  Allergen Reactions  . Amlodipine Other (See Comments)    EDEMA  . Meperidine Hcl Other (See Comments)    hallucinations  . Naproxen Other (See Comments)    Extreme bruising     Review of Systems  Constitutional: Positive for fatigue. Negative for activity change, appetite change, chills, diaphoresis, fever and unexpected weight change.  HENT: Negative for trouble swallowing and voice change.   Respiratory: Positive for cough, shortness of breath and wheezing.   Cardiovascular: Positive for leg swelling (occassional left > right). Negative for chest pain.    LMP 10/05/1999  Physical Exam  Constitutional: She is oriented to person, place, and time. No distress.  60 yo woman in NAD, [place] O2 in place  HENT:  Head: Normocephalic and atraumatic.  Eyes: Conjunctivae and EOM are normal. No scleral icterus.  Neck: Neck supple.  Cardiovascular: Normal rate, regular rhythm and normal heart sounds.   No murmur heard. Pulmonary/Chest: Effort normal. No respiratory distress. She has wheezes.  Musculoskeletal: She exhibits no edema.  Lymphadenopathy:    She has no cervical adenopathy.  Neurological: She is alert and oriented to person, place, and time. No cranial nerve deficit. She exhibits normal muscle tone.  Skin: Skin is warm and dry.  Vitals reviewed.    Diagnostic Tests: NUCLEAR MEDICINE PET SKULL BASE TO THIGH  TECHNIQUE: 7.0 mCi F-18 FDG was injected intravenously. Full-ring PET imaging was performed from the skull base to thigh after the radiotracer. CT data was obtained and used for attenuation correction and anatomic localization.  FASTING BLOOD GLUCOSE:  Value: 156 mg/dl  COMPARISON:  Multiple exams, including chest CT of 03/30/2016  FINDINGS: NECK  No hypermetabolic lymph nodes in the neck. Periapical lucency involving a right anterior mandibular tooth, possibly a canine, with faint hypermetabolic activity which is thought to be inflammatory.  CHEST  1.5 by 1.1 cm right lower lobe pulmonary nodule image 46/8, maximum standard uptake value 11.3.  Left lower lobe 1.6 by 1.7 cm pulmonary nodule with adjacent distal passive  atelectasis, maximum standard uptake value 10.5.  Left upper hilar node or nodule, maximum standard uptake value 5.6. Small lymph node below the bronchus intermedius, maximum standard uptake value 4.6.  Severe centrilobular emphysema. Volume loss and some vascular prominence in the left lower lobe. Bilateral airway thickening.  ABDOMEN/PELVIS  Physiologic activity in stomach and small and large bowel. No findings of metastatic disease to the abdomen. Lobular liver contour. Extensive calcification in the pancreas compatible chronic calcific pancreatitis. Aortoiliac atherosclerotic vascular disease. Right kidney lower pole hypodense lesion, photopenic, likely a cyst.  SKELETON  No focal hypermetabolic activity to suggest skeletal metastasis.  IMPRESSION: 1. Abnormal hypermetabolic single pulmonary nodules in both lower lobes along with small hypermetabolic left upper hilar lymph node and faintly hypermetabolic lymph node just below the bronchus intermedius. Appearance favors malignancy, active granulomatous disease considered possible but less likely. Tissue  diagnosis recommended. 2. Severe centrilobular emphysema. 3. Airway thickening is present, suggesting bronchitis or reactive airways disease. 4. Other imaging findings of potential clinical significance: Lobular liver contour, possible cirrhosis. Chronic calcific pancreatitis. Aortoiliac atherosclerotic vascular disease. Right kidney lower pole cyst. Periapical lucency involving a right anterior mandibular tooth, possibly the canine.   Electronically Signed   By: Van Clines M.D.   On: 05/14/2016 11:46 I personally reviewed the CT and PET CT and concur with the findings noted above.  Impression: 60 yo woman with a history of tobacco abuse, COPD and pulmonary hypertension without right heart failure, who has bilateral lower lobe lung nodules and some adenopathy. Differential diagnosis includes synchronous  lung cancers, metastatic lung cancer, and granulomatous disease.  She denies any recent wheezing but is wheezing pretty significantly on exam. She is on home O2. She is a poor candidate for GA to do ENB. I think a CT guided biopsy is a better option albeit with increased risk for that procedure relative to average due to COPD and pulmonary HTN.   There is no surgical option for her.  Will defer remainder of w/u and treatment to Dr. Julien Nordmann and Tammi Klippel  Plan:   Melrose Nakayama, MD [facility] [phone]

## 2016-05-20 NOTE — Progress Notes (Signed)
[facility] Clinical Social Work  Clinical Social Work met with patient/family at [facility] appointment to offer support and assess for psychosocial needs.  The patient was accompanied by her spouse and grandson (whom lives with her).  She reported her only concern was "curing this if it's cancer".  The patient shared she doesn't feel she has had a chance to process information she learned from providers today.  Mrs. Kassa takes alprazolam and feels that is effective in dealing with mild anxiety.    Clinical Social Work briefly discussed Clinical Social Work role and [facility] support programs/services.  Clinical Social Work encouraged patient to call with any additional questions or concerns.  Patient plans to call CSW as needed once she determines whether she will need treatment.   Lauren Mullis, MSW, LCSW, OSW-C Clinical Social Worker [facility] [phone]  

## 2016-05-21 ENCOUNTER — Telehealth: Payer: Self-pay | Admitting: *Deleted

## 2016-05-21 NOTE — Telephone Encounter (Signed)
CALLED PATIENT TO INFORM OF FU APPT. ON 06-03-16 @ 1 PM , SPOKE WITH PATIENT AND SHE IS AWARE OF THIS APPT.

## 2016-05-25 ENCOUNTER — Other Ambulatory Visit: Payer: Self-pay | Admitting: Radiology

## 2016-05-25 ENCOUNTER — Telehealth: Payer: Self-pay | Admitting: Internal Medicine

## 2016-05-25 NOTE — Telephone Encounter (Signed)
APPTS CONF WIT PATIENT, PER 05/20/16 LOS.

## 2016-05-26 ENCOUNTER — Other Ambulatory Visit: Payer: Self-pay | Admitting: General Surgery

## 2016-05-27 ENCOUNTER — Ambulatory Visit (HOSPITAL_COMMUNITY)
Admission: RE | Admit: 2016-05-27 | Discharge: 2016-05-27 | Disposition: A | Discharge status: Home / Self Care | Payer: Medicare Other | Source: Ambulatory Visit | Attending: Interventional Radiology | Admitting: Interventional Radiology

## 2016-05-27 ENCOUNTER — Observation Stay (HOSPITAL_COMMUNITY)
Admission: RE | Admit: 2016-05-27 | Discharge: 2016-05-28 | Disposition: A | Discharge status: Home / Self Care | Payer: Medicare Other | Source: Ambulatory Visit | Attending: Interventional Radiology | Admitting: Interventional Radiology

## 2016-05-27 ENCOUNTER — Encounter (HOSPITAL_COMMUNITY): Payer: Self-pay

## 2016-05-27 DIAGNOSIS — Z9049 Acquired absence of other specified parts of digestive tract: Secondary | ICD-10-CM | POA: Diagnosis not present

## 2016-05-27 DIAGNOSIS — Z9981 Dependence on supplemental oxygen: Secondary | ICD-10-CM | POA: Insufficient documentation

## 2016-05-27 DIAGNOSIS — K861 Other chronic pancreatitis: Secondary | ICD-10-CM | POA: Diagnosis not present

## 2016-05-27 DIAGNOSIS — Z87891 Personal history of nicotine dependence: Secondary | ICD-10-CM | POA: Diagnosis not present

## 2016-05-27 DIAGNOSIS — I10 Essential (primary) hypertension: Secondary | ICD-10-CM | POA: Diagnosis not present

## 2016-05-27 DIAGNOSIS — J449 Chronic obstructive pulmonary disease, unspecified: Secondary | ICD-10-CM | POA: Insufficient documentation

## 2016-05-27 DIAGNOSIS — R0489 Hemorrhage from other sites in respiratory passages: Secondary | ICD-10-CM | POA: Diagnosis not present

## 2016-05-27 DIAGNOSIS — K219 Gastro-esophageal reflux disease without esophagitis: Secondary | ICD-10-CM | POA: Insufficient documentation

## 2016-05-27 DIAGNOSIS — Z7982 Long term (current) use of aspirin: Secondary | ICD-10-CM | POA: Diagnosis not present

## 2016-05-27 DIAGNOSIS — I272 Other secondary pulmonary hypertension: Secondary | ICD-10-CM | POA: Diagnosis present

## 2016-05-27 DIAGNOSIS — E039 Hypothyroidism, unspecified: Secondary | ICD-10-CM | POA: Diagnosis not present

## 2016-05-27 DIAGNOSIS — E1142 Type 2 diabetes mellitus with diabetic polyneuropathy: Secondary | ICD-10-CM | POA: Diagnosis not present

## 2016-05-27 DIAGNOSIS — R042 Hemoptysis: Secondary | ICD-10-CM | POA: Diagnosis not present

## 2016-05-27 DIAGNOSIS — R918 Other nonspecific abnormal finding of lung field: Secondary | ICD-10-CM | POA: Diagnosis present

## 2016-05-27 DIAGNOSIS — I2723 Pulmonary hypertension due to lung diseases and hypoxia: Secondary | ICD-10-CM

## 2016-05-27 DIAGNOSIS — C3432 Malignant neoplasm of lower lobe, left bronchus or lung: Principal | ICD-10-CM | POA: Insufficient documentation

## 2016-05-27 DIAGNOSIS — M810 Age-related osteoporosis without current pathological fracture: Secondary | ICD-10-CM | POA: Diagnosis not present

## 2016-05-27 DIAGNOSIS — R911 Solitary pulmonary nodule: Secondary | ICD-10-CM | POA: Diagnosis not present

## 2016-05-27 LAB — CBC
HCT: 40.9 % (ref 36.0–46.0)
Hemoglobin: 13.3 g/dL (ref 12.0–15.0)
MCH: 31.4 pg (ref 26.0–34.0)
MCHC: 32.5 g/dL (ref 30.0–36.0)
MCV: 96.7 fL (ref 78.0–100.0)
PLATELETS: 146 10*3/uL — AB (ref 150–400)
RBC: 4.23 MIL/uL (ref 3.87–5.11)
RDW: 13.4 % (ref 11.5–15.5)
WBC: 6.1 10*3/uL (ref 4.0–10.5)

## 2016-05-27 LAB — PROTIME-INR
INR: 1.07
PROTHROMBIN TIME: 13.9 s (ref 11.4–15.2)

## 2016-05-27 LAB — APTT: APTT: 30 s (ref 24–36)

## 2016-05-27 MED ORDER — ALBUTEROL SULFATE (2.5 MG/3ML) 0.083% IN NEBU: 3.0000 mL | INHALATION_SOLUTION | Freq: Four times a day (QID) | RESPIRATORY_TRACT | Status: DC | PRN

## 2016-05-27 MED ORDER — ATORVASTATIN CALCIUM 40 MG PO TABS
40.0000 mg | ORAL_TABLET | Freq: Every morning | ORAL | Status: DC
  Administered 2016-05-27 – 2016-05-28 (×2): 40 mg via ORAL
  Filled 2016-05-27 (×2): qty 1

## 2016-05-27 MED ORDER — MIDAZOLAM HCL 2 MG/2ML IJ SOLN
INTRAMUSCULAR | Status: AC
  Filled 2016-05-27: qty 2

## 2016-05-27 MED ORDER — ALPRAZOLAM 0.5 MG PO TABS
0.5000 mg | ORAL_TABLET | Freq: Three times a day (TID) | ORAL | Status: DC | PRN
  Administered 2016-05-27: 0.5 mg via ORAL
  Filled 2016-05-27: qty 1

## 2016-05-27 MED ORDER — FENTANYL CITRATE (PF) 100 MCG/2ML IJ SOLN
INTRAMUSCULAR | Status: AC
  Filled 2016-05-27: qty 2

## 2016-05-27 MED ORDER — OXYCODONE HCL 5 MG PO TABS
15.0000 mg | ORAL_TABLET | Freq: Four times a day (QID) | ORAL | Status: DC | PRN
  Administered 2016-05-27 – 2016-05-28 (×3): 15 mg via ORAL
  Filled 2016-05-27 (×3): qty 3

## 2016-05-27 MED ORDER — MORPHINE SULFATE (PF) 2 MG/ML IV SOLN
1.0000 mg | INTRAVENOUS | Status: DC | PRN
  Administered 2016-05-27: 1 mg via INTRAVENOUS
  Filled 2016-05-27: qty 1

## 2016-05-27 MED ORDER — BUDESONIDE 0.5 MG/2ML IN SUSP
0.5000 mg | Freq: Two times a day (BID) | RESPIRATORY_TRACT | Status: DC
  Administered 2016-05-27 – 2016-05-28 (×2): 0.5 mg via RESPIRATORY_TRACT
  Filled 2016-05-27 (×2): qty 2

## 2016-05-27 MED ORDER — MIDAZOLAM HCL 2 MG/2ML IJ SOLN
INTRAMUSCULAR | Status: AC | PRN
  Administered 2016-05-27 (×2): 1 mg via INTRAVENOUS

## 2016-05-27 MED ORDER — FENTANYL CITRATE (PF) 100 MCG/2ML IJ SOLN
INTRAMUSCULAR | Status: AC | PRN
  Administered 2016-05-27 (×4): 50 ug via INTRAVENOUS

## 2016-05-27 MED ORDER — CARVEDILOL 6.25 MG PO TABS
6.2500 mg | ORAL_TABLET | Freq: Two times a day (BID) | ORAL | Status: DC
  Administered 2016-05-27 – 2016-05-28 (×2): 6.25 mg via ORAL
  Filled 2016-05-27 (×2): qty 1

## 2016-05-27 MED ORDER — IPRATROPIUM-ALBUTEROL 0.5-2.5 (3) MG/3ML IN SOLN
3.0000 mL | Freq: Three times a day (TID) | RESPIRATORY_TRACT | Status: DC
  Administered 2016-05-27 – 2016-05-28 (×3): 3 mL via RESPIRATORY_TRACT
  Filled 2016-05-27 (×3): qty 3

## 2016-05-27 MED ORDER — ASPIRIN EC 81 MG PO TBEC
81.0000 mg | DELAYED_RELEASE_TABLET | Freq: Every day | ORAL | Status: DC
  Administered 2016-05-27: 81 mg via ORAL
  Filled 2016-05-27: qty 1

## 2016-05-27 MED ORDER — POTASSIUM CHLORIDE CRYS ER 20 MEQ PO TBCR
20.0000 meq | EXTENDED_RELEASE_TABLET | Freq: Two times a day (BID) | ORAL | Status: DC
  Administered 2016-05-27 – 2016-05-28 (×3): 20 meq via ORAL
  Filled 2016-05-27 (×3): qty 1

## 2016-05-27 MED ORDER — LIDOCAINE HCL 1 % IJ SOLN
INTRAMUSCULAR | Status: AC
  Filled 2016-05-27: qty 20

## 2016-05-27 MED ORDER — GABAPENTIN 300 MG PO CAPS
300.0000 mg | ORAL_CAPSULE | Freq: Two times a day (BID) | ORAL | Status: DC
  Administered 2016-05-27: 300 mg via ORAL
  Filled 2016-05-27: qty 1

## 2016-05-27 MED ORDER — SODIUM CHLORIDE 0.9 % IV SOLN: 250.0000 mL | INTRAVENOUS | Status: DC | PRN

## 2016-05-27 MED ORDER — FENTANYL CITRATE (PF) 100 MCG/2ML IJ SOLN
50.0000 ug | Freq: Once | INTRAMUSCULAR | Status: AC
  Administered 2016-05-27: 50 ug via INTRAVENOUS

## 2016-05-27 MED ORDER — TRIAMTERENE-HCTZ 37.5-25 MG PO TABS
1.0000 | ORAL_TABLET | Freq: Every day | ORAL | Status: DC
  Administered 2016-05-28: 1 via ORAL
  Filled 2016-05-27: qty 1

## 2016-05-27 MED ORDER — SODIUM CHLORIDE 0.9 % IV SOLN
INTRAVENOUS | Status: DC
  Administered 2016-05-27: 10:00:00 via INTRAVENOUS

## 2016-05-27 NOTE — Procedures (Signed)
S/p LLL NODULE 18G CORE BX  STABLE  COMP: LLL ACUTE PULM HEMORRHAGE  HD STABLE BUT WITH HEMOPTYSIS  PATH PENDING  FULL REPORT IN PACS  WILL ADMIT FOR OVERNIGHT OBSERVATION  CXR PENDING

## 2016-05-27 NOTE — Sedation Documentation (Signed)
Pt. On stretcher sitting with head up holding suction. Frank red blood with active coughing. Pt is aware and oriented.

## 2016-05-27 NOTE — Sedation Documentation (Signed)
Pt having hemoptysis after procedure. O2 sats remain above 95%. MD at bedside. Another scan to be preformed.

## 2016-05-27 NOTE — Care Management Obs Status (Signed)
[place] NOTIFICATION   Patient Details  Name: Phyllis [name] MRN: [id number] Date of Birth: 05/27/1956   Medicare Observation Status Notification Given:  Yes    Apolonio Schneiders, RN 05/27/2016, 7:39 PM

## 2016-05-27 NOTE — H&P (Signed)
Chief Complaint: LLL lung mass  Referring Physician:Dr. Curt Bears  Supervising Physician: Daryll Brod  Patient Status: Out-pt  HPI: Phyllis [name] is an 60 y.o. female who underwent a CT of the chest in June to follow up on some pulmonary nodules seen previously.  These revealed an increase in size of the LLL lung nodule which was suspicious for bronchogenic carcinoma.  A PET scan was obtained and this appeared hypermetabolic.  She has been seen by Dr. Roxan Hockey and Dr. Julien Nordmann.  A request has been made for a LLL lung mass biopsy.  She is on 3L [place] of oxygen at home always due to COPD.  Dr. Halford Chessman is her pulmonologist.  She has no other new complaints today.  Past Medical History:  Past Medical History:  Diagnosis Date  . ASTHMA 06/06/2009  . BACK PAIN 08/06/2010  . Chronic pancreatitis ([place])   . COPD with emphysema ([place])   . DEPRESSION 06/06/2009   states she is not depressed  . Diabetes mellitus ([place])   . Eczema   . Edema 05/20/2010  . GERD 06/06/2009  . HEPATITIS C WITHOUT HEPATIC COMA 06/06/2009  . HYPERTENSION 06/06/2009  . OSTEOPOROSIS 06/06/2009  . 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 TOBACCO USE 06/05/2010  . Unspecified hypothyroidism 06/05/2010  . VITAMIN D DEFICIENCY 06/06/2009    Past Surgical History:  Past Surgical History:  Procedure Laterality Date  . BREAST LUMPECTOMY     benign, right  . CHOLECYSTECTOMY    . LEFT HEART CATHETERIZATION WITH CORONARY ANGIOGRAM N/A 03/08/2013   Procedure: LEFT HEART CATHETERIZATION WITH CORONARY ANGIOGRAM;  Surgeon: Clent Demark, MD;  Location: [facility] CATH LAB;  Service: Cardiovascular;  Laterality: N/A;  . LUMBAR LAMINECTOMY    . TONSILLECTOMY    . TUBAL LIGATION      Family History:  Family History  Problem Relation Age of Onset  . COPD Father   . Hypertension Mother   . Kidney disease Mother   . Colon cancer Neg Hx   . Stomach cancer Neg Hx     Social History:  reports that she quit smoking  about 13 months ago. Her smoking use included Cigarettes. She has a 3.50 pack-year smoking history. She has never used smokeless tobacco. She reports that she does not drink alcohol or use drugs.  Allergies:  Allergies  Allergen Reactions  . Amlodipine Other (See Comments)    EDEMA  . Meperidine Hcl Other (See Comments)    hallucinations  . Naproxen Other (See Comments)    Extreme bruising    Medications: Medications reviewed in Epic  Please HPI for pertinent positives, otherwise complete 10 system ROS negative.  Mallampati Score: MD Evaluation Airway: WNL Heart: WNL Abdomen: WNL Chest/ Lungs: Other (comments) Chest/ lungs comments: on 3L [place] at all times ASA  Classification: 3 Mallampati/Airway Score: Two  Physical Exam: BP (!) 158/80   Pulse 69   Temp 97.7 F (36.5 C)   Resp 20   Ht _0  (1.676 m)   Wt 138 lb (62.6 kg)   LMP 10/05/1999   BMI 22.27 kg/m  Body mass index is 22.27 kg/m. General: pleasant, WD, WN black female who is laying in bed in NAD HEENT: head is normocephalic, atraumatic.  Sclera are noninjected.  PERRL.  Ears and nose without any masses or lesions.  [place] in place with 3L of O2.  Mouth is pink and moist Heart: regular, rate, and rhythm.  Normal s1,s2. No obvious murmurs,  gallops, or rubs noted.  Palpable radial and pedal pulses bilaterally Lungs: CTAB, no wheezes, rhonchi, or rales noted.  Respiratory effort nonlabored Abd: soft, NT, ND, +BS, no masses, hernias, or organomegaly MS: all 4 extremities are symmetrical with no cyanosis, clubbing, or edema. Psych: A&Ox3 with an appropriate affect.   Labs: Pending   Imaging: No results found.  Assessment/Plan 1. LLL lung mass -we will proceed with biopsy today. -her vitals have been reviewed and her labs are pending -Risks and Benefits discussed with the patient including, but not limited to bleeding, infection, damage to adjacent structures or low yield requiring additional tests. All of the  patient's questions were answered, patient is agreeable to proceed. Consent signed and in chart.   Thank you for this interesting consult.  I greatly enjoyed meeting Tamyia A Spragg and look forward to participating in their care.  A copy of this report was sent to the requesting provider on this date.  Electronically Signed: Henreitta Cea 05/27/2016, 10:16 AM   I spent a total of  30 Minutes  in face to face in clinical consultation, greater than 50% of which was counseling/coordinating care for left lower lobe lung lesion

## 2016-05-27 NOTE — Sedation Documentation (Signed)
Patient denies pain and is resting comfortably.  

## 2016-05-27 NOTE — Progress Notes (Signed)
Referring Physician(s): Mohamed,Mohamed  Supervising Physician: Daryll Brod  Patient Status:  Inpatient  Chief Complaint:  Left lung mass bx 8/24 in IR Post procedure hemoptysis  Subjective:  Less and less hemoptysis since procedure Less Left sided pain  Still uncomfortable at times No other complaints  Allergies: Amlodipine; Meperidine hcl; and Naproxen  Medications: Prior to Admission medications   Medication Sig Start Date End Date Taking? Authorizing Provider  Albuterol Sulfate (PROAIR RESPICLICK) 219 (90 BASE) MCG/ACT AEPB Inhale 1 puff into the lungs 4 (four) times daily as needed (For shortness of breath.). 04/01/15  Yes Shanker Kristeen Mans, MD  ALPRAZolam Duanne Moron) 0.5 MG tablet Take 1 tablet (0.5 mg total) by mouth 3 (three) times daily as needed. Patient taking differently: Take 0.5 mg by mouth 3 (three) times daily as needed for anxiety.  05/17/16  Yes [name], MD  aspirin EC 81 MG tablet Take 81 mg by mouth at bedtime.    Yes Historical Provider, MD  atorvastatin (LIPITOR) 40 MG tablet TAKE 1 TABLET(40 MG) BY MOUTH DAILY 6 PM 05/03/16  Yes [name], MD  budesonide (PULMICORT) 0.5 MG/2ML nebulizer solution Take 2 mLs (0.5 mg total) by nebulization 2 (two) times daily. 01/27/16  Yes Chesley Mires, MD  carvedilol (COREG) 6.25 MG tablet Take 1 tablet (6.25 mg total) by mouth 2 (two) times daily with a meal. 12/02/15  Yes [name], MD  gabapentin (NEURONTIN) 300 MG capsule TAKE 1 CAPSULE BY MOUTH TWICE DAILY Patient taking differently: TAKE 1 CAPSULE (300 mg) BY MOUTH TWICE DAILY 02/03/16  Yes [name], MD  ipratropium-albuterol (DUONEB) 0.5-2.5 (3) MG/3ML SOLN Take 3 mLs by nebulization 3 (three) times daily. 07/14/15  Yes Chesley Mires, MD  oxyCODONE (ROXICODONE) 15 MG immediate release tablet Take 1 tablet (15 mg total) by mouth every 6 (six) hours as needed. For pain. 05/17/16  Yes [name], MD  potassium chloride SA (KLOR-CON M20) 20 MEQ tablet  Take 1 tablet (20 mEq total) by mouth 2 (two) times daily. 02/09/16  Yes [name], MD  sodium chloride (OCEAN) 0.65 % SOLN nasal spray Place 1 spray into both nostrils as needed for congestion. 04/01/15  Yes Shanker Kristeen Mans, MD  triamcinolone ointment (KENALOG) 0.1 % Apply 1 application topically 2 (two) times daily. 04/01/15  Yes Shanker Kristeen Mans, MD  triamterene-hydrochlorothiazide (MAXZIDE-25) 37.5-25 MG tablet Take 1 tablet by mouth daily. 05/04/16  Yes [name], MD     Vital Signs: BP (!) 177/76   Pulse 72   Temp 97.7 F (36.5 C)   Resp 17   Ht _0  (1.676 m)   Wt 138 lb (62.6 kg)   LMP 10/05/1999   SpO2 100%   BMI 22.27 kg/m   Physical Exam  Constitutional: She is oriented to person, place, and time.  Pulmonary/Chest: Effort normal and breath sounds normal.  Neurological: She is alert and oriented to person, place, and time.  Skin: Skin is warm and dry.  Psychiatric: She has a normal mood and affect. Her behavior is normal. Judgment and thought content normal.  Nursing note and vitals reviewed.   Imaging: Ct Biopsy  Result Date: 05/27/2016 INDICATION: Hypermetabolic bilateral lower lobe pulmonary nodules, larger on the left. EXAM: CT-GUIDED BIOPSY LEFT LOWER LOBE PULMONARY NODULE MEDICATIONS: 1% LIDOCAINE LOCALLY ANESTHESIA/SEDATION: 2.0 mg IV Versed; 200 mcg IV Fentanyl Moderate Sedation Time:  60 MINUTES The patient was continuously monitored during the procedure by the interventional  radiology nurse under my direct supervision. PROCEDURE: The procedure, risks, benefits, and alternatives were explained to the patient. Questions regarding the procedure were encouraged and answered. The patient understands and consents to the procedure. Previous imaging reviewed. Patient positioned supine. Noncontrast localization CT performed through the chest with and left arm up. The left lower lobe peripheral subpleural pulmonary nodule was localized. Overlying skin was marked. The  LEFT LATERAL CHEST was prepped with CHLORAPREP in a sterile fashion, and a sterile drape was applied covering the operative field. A sterile gown and sterile gloves were used for the procedure. Under CT guidance, a(n) 17 gauge guide needle was advanced into the left lower lobe pulmonary mass. 2 18 gauge core biopsies were obtained. Biosentry occlusion device utilized to aide in pneumothorax prevention upon removing the guide needle. Final imaging was performed. Vital sign monitoring by nursing staff during the procedure will continue as patient is in the special procedures unit for post procedure observation. FINDINGS: The images document guide needle placement within the left lower lobe nodule. Post biopsy images demonstrate acute left lower lobe moderate to large pulmonary hemorrhage. No pneumothorax or effusion. COMPLICATIONS: SIR LEVEL B - Normal therapy, includes overnight admission for observation. IMPRESSION: Successful CT-guided core biopsy of the left lower lobe nodule Electronically Signed   By: Jerilynn Mages.  Shick M.D.   On: 05/27/2016 14:23   Dg Chest Port 1 View  Result Date: 05/27/2016 CLINICAL DATA:  Status post lung biopsy. EXAM: PORTABLE CHEST 1 VIEW COMPARISON:  Radiographs of April 01, 2015. FINDINGS: The heart size and mediastinal contours are within normal limits. No pneumothorax or pleural effusion is noted. Ill-defined density is seen in lingular region consistent with neoplasm and associated pulmonary hemorrhage. The visualized skeletal structures are unremarkable. IMPRESSION: No pneumothorax status post left lung biopsy. Pulmonary hemorrhage is noted. Electronically Signed   By: Marijo Conception, M.D.   On: 05/27/2016 14:08    Labs:  CBC:  Recent Labs  12/02/15 1348 05/17/16 1450 05/27/16 1000  WBC 7.2 8.5 6.1  HGB 15.3* 12.7 13.3  HCT 46.5* 38.7 40.9  PLT 201.0 209.0 146*    COAGS:  Recent Labs  05/27/16 1000  INR 1.07  APTT 30    BMP:  Recent Labs  06/30/15 1406  12/02/15 1348 02/26/16 1422 05/17/16 1450  NA 138 132* 130* 129*  K 4.1 3.5 3.9 4.5  CL 101 93* 94* 91*  CO2 29 32 37* 35*  GLUCOSE 105* 109* 109* 101*  BUN 7 4* 4* 6  CALCIUM 9.8 9.5 9.8 9.1  CREATININE 0.89 0.74 0.65 0.77    LIVER FUNCTION TESTS:  Recent Labs  12/02/15 1348  BILITOT 0.5  AST 15  ALT 9  ALKPHOS 90  PROT 7.5  ALBUMIN 4.1    Assessment and Plan:  Hemoptysis post Left lung mass biopsy 8/24 Admitted for 24 hr observation to IR Will check in am Cxr ordered for am  Electronically Signed: Inell Mimbs A 05/27/2016, 4:10 PM   I spent a total of 15 Minutes at the the patient's bedside AND on the patient's hospital floor or unit, greater than 50% of which was counseling/coordinating care for left lung mass bx with post hemoptysis

## 2016-05-27 NOTE — Sedation Documentation (Signed)
Patient is resting comfortably. 

## 2016-05-27 NOTE — Progress Notes (Signed)
RN called back to the number provided by radiology to get report. No answer. Will await a return call.

## 2016-05-27 NOTE — Sedation Documentation (Signed)
Pt feels relief from pain meds. Breathing better.

## 2016-05-28 ENCOUNTER — Ambulatory Visit (HOSPITAL_COMMUNITY): Admission: RE | Admit: 2016-05-28 | Payer: Medicare Other | Source: Ambulatory Visit

## 2016-05-28 ENCOUNTER — Observation Stay (HOSPITAL_COMMUNITY): Payer: Medicare Other

## 2016-05-28 DIAGNOSIS — R918 Other nonspecific abnormal finding of lung field: Secondary | ICD-10-CM | POA: Diagnosis not present

## 2016-05-28 DIAGNOSIS — K219 Gastro-esophageal reflux disease without esophagitis: Secondary | ICD-10-CM | POA: Diagnosis not present

## 2016-05-28 DIAGNOSIS — Z9049 Acquired absence of other specified parts of digestive tract: Secondary | ICD-10-CM | POA: Diagnosis not present

## 2016-05-28 DIAGNOSIS — J449 Chronic obstructive pulmonary disease, unspecified: Secondary | ICD-10-CM | POA: Diagnosis not present

## 2016-05-28 DIAGNOSIS — R05 Cough: Secondary | ICD-10-CM | POA: Diagnosis not present

## 2016-05-28 DIAGNOSIS — E039 Hypothyroidism, unspecified: Secondary | ICD-10-CM | POA: Diagnosis not present

## 2016-05-28 DIAGNOSIS — Z7982 Long term (current) use of aspirin: Secondary | ICD-10-CM | POA: Diagnosis not present

## 2016-05-28 DIAGNOSIS — I10 Essential (primary) hypertension: Secondary | ICD-10-CM | POA: Diagnosis not present

## 2016-05-28 DIAGNOSIS — Z9981 Dependence on supplemental oxygen: Secondary | ICD-10-CM | POA: Diagnosis not present

## 2016-05-28 DIAGNOSIS — M810 Age-related osteoporosis without current pathological fracture: Secondary | ICD-10-CM | POA: Diagnosis not present

## 2016-05-28 DIAGNOSIS — Z87891 Personal history of nicotine dependence: Secondary | ICD-10-CM | POA: Diagnosis not present

## 2016-05-28 DIAGNOSIS — K861 Other chronic pancreatitis: Secondary | ICD-10-CM | POA: Diagnosis not present

## 2016-05-28 DIAGNOSIS — R042 Hemoptysis: Secondary | ICD-10-CM | POA: Diagnosis not present

## 2016-05-28 DIAGNOSIS — R0489 Hemorrhage from other sites in respiratory passages: Secondary | ICD-10-CM | POA: Diagnosis not present

## 2016-05-28 DIAGNOSIS — E1142 Type 2 diabetes mellitus with diabetic polyneuropathy: Secondary | ICD-10-CM | POA: Diagnosis not present

## 2016-05-28 DIAGNOSIS — C3432 Malignant neoplasm of lower lobe, left bronchus or lung: Secondary | ICD-10-CM | POA: Diagnosis not present

## 2016-05-28 NOTE — Discharge Planning (Signed)
AVS reviewed and given to patient who verbalizes understanding. DC to private car home with all personal belongings, accompanied by husband at 1050.

## 2016-05-28 NOTE — Discharge Summary (Signed)
Patient ID: Phyllis [name] MRN: [id number] DOB/AGE: 07-Apr-1956 60 y.o.  Admit date: 05/27/2016 Discharge date: 05/28/2016  Supervising Physician: Phyllis [name]  Admission Diagnoses: Left lung mass  Discharge Diagnoses:  Active Problems:   Lung mass   Discharged Condition: stable; improved  Hospital Course:   Left lung mass Pt referred by Phyllis [name] for biopsy of mass Biopsy was performed by Phyllis [name] 8/24 in CT. Pt did well but did develop hemoptysis post procedure. CXR post procedure:IMPRESSION: No pneumothorax status post left lung biopsy. Pulmonary hemorrhage is noted. Phyllis [name] felt best to admit pt for 24 hr observation and she has done well overnight. Has had no hemoptysis since yesterday. CXR today:  IMPRESSION: COPD. Chronic interstitial prominence. Persistent abnormal increased density in the left lower lobe consistent with a known mass. No postprocedure pneumothorax or hemothorax.  Pt doing well this am Eating well; slept well. Denies pain or cough or hemoptysis Ambulating Plan for discharge to home I have discussed status with Phyllis [name] and examined pt  Consults: None  Significant Diagnostic Studies: CT guided left lung mass biopsy  Treatments: S/p LLL NODULE 18G CORE BX  Discharge Exam: Blood pressure (!) 142/70, pulse 77, temperature 98 F (36.7 C), temperature source Oral, resp. rate 16, height _0  (1.676 m), weight 138 lb (62.6 kg), last menstrual period 10/05/1999, SpO2 100 %.  PE: A/O; appropriate [place]: RRR Lungs: minimal wheezes Site of Left lung mass biopsy: clean and dry; NT; no hematoma Abd: soft; BS; NT Ext: FROM Gait steady CXR 8/25:  IMPRESSION: COPD. Chronic interstitial prominence. Persistent abnormal increased density in the left lower lobe consistent with a known mass. No postprocedure pneumothorax or hemothorax.  Results for orders placed or performed during the hospital encounter of 05/27/16  APTT  upon arrival  Result Value Ref Range   aPTT 30 24 - 36 seconds  CBC upon arrival  Result Value Ref Range   WBC 6.1 4.0 - 10.5 K/uL   RBC 4.23 3.87 - 5.11 MIL/uL   Hemoglobin 13.3 12.0 - 15.0 g/dL   HCT 40.9 36.0 - 46.0 %   MCV 96.7 78.0 - 100.0 fL   MCH 31.4 26.0 - 34.0 pg   MCHC 32.5 30.0 - 36.0 g/dL   RDW 13.4 11.5 - 15.5 %   Platelets 146 (L) 150 - 400 K/uL  Protime-INR upon arrival  Result Value Ref Range   Prothrombin Time 13.9 11.4 - 15.2 seconds   INR 1.07     Disposition: Left lung mass biopsy performed 8/24 in Radiology with Phyllis [name] Post procedure hemoptysis Quickly resolved Overnight observation No complaints; doing well Plan for discharge to home Continue all home meds Follow with Phyllis [name] I have seen and examined pt and discussed with Phyllis [name] Pt has good understanding of discharge instructions  Discharge Instructions    Call MD for:  difficulty breathing, headache or visual disturbances    Complete by:  As directed   Call MD for:  hives    Complete by:  As directed   Call MD for:  persistant dizziness or light-headedness    Complete by:  As directed   Call MD for:  persistant nausea and vomiting    Complete by:  As directed   Call MD for:  redness, tenderness, or signs of infection (pain, swelling, redness, odor or green/yellow discharge around incision site)    Complete by:  As directed   Call MD for:  severe uncontrolled pain  Complete by:  As directed   Call MD for:  temperature >100.4    Complete by:  As directed   Diet - low sodium heart healthy    Complete by:  As directed   Discharge instructions    Complete by:  As directed   Follow with Phyllis [name]; continue all home meds   Discharge wound care:    Complete by:  As directed   May shower today; keep bandaid on Rt bx site for few days   Driving Restrictions    Complete by:  As directed   May drive starting 3/62   Increase activity slowly    Complete by:  As directed   Lifting restrictions     Complete by:  As directed   No lifting over 10 lbs x 1 week       Medication List    TAKE these medications   Albuterol Sulfate 108 (90 Base) MCG/ACT Aepb Commonly known as:  PROAIR RESPICLICK Inhale 1 puff into the lungs 4 (four) times daily as needed (For shortness of breath.).   ALPRAZolam 0.5 MG tablet Commonly known as:  XANAX Take 1 tablet (0.5 mg total) by mouth 3 (three) times daily as needed. What changed:  reasons to take this   aspirin EC 81 MG tablet Take 81 mg by mouth at bedtime.   atorvastatin 40 MG tablet Commonly known as:  LIPITOR TAKE 1 TABLET(40 MG) BY MOUTH DAILY 6 PM   budesonide 0.5 MG/2ML nebulizer solution Commonly known as:  PULMICORT Take 2 mLs (0.5 mg total) by nebulization 2 (two) times daily.   carvedilol 6.25 MG tablet Commonly known as:  COREG Take 1 tablet (6.25 mg total) by mouth 2 (two) times daily with a meal.   gabapentin 300 MG capsule Commonly known as:  NEURONTIN TAKE 1 CAPSULE BY MOUTH TWICE DAILY What changed:  See the new instructions.   ipratropium-albuterol 0.5-2.5 (3) MG/3ML Soln Commonly known as:  DUONEB Take 3 mLs by nebulization 3 (three) times daily.   oxyCODONE 15 MG immediate release tablet Commonly known as:  ROXICODONE Take 1 tablet (15 mg total) by mouth every 6 (six) hours as needed. For pain.   potassium chloride SA 20 MEQ tablet Commonly known as:  KLOR-CON M20 Take 1 tablet (20 mEq total) by mouth 2 (two) times daily.   sodium chloride 0.65 % Soln nasal spray Commonly known as:  OCEAN Place 1 spray into both nostrils as needed for congestion.   triamcinolone ointment 0.1 % Commonly known as:  KENALOG Apply 1 application topically 2 (two) times daily.   triamterene-hydrochlorothiazide 37.5-25 MG tablet Commonly known as:  MAXZIDE-25 Take 1 tablet by mouth daily.         Electronically Signed: Monia [name] A 05/28/2016, 9:21 AM   I have spent Greater Than 30 Minutes discharging [name].

## 2016-05-29 ENCOUNTER — Ambulatory Visit (HOSPITAL_COMMUNITY)
Admission: RE | Admit: 2016-05-29 | Discharge: 2016-05-29 | Disposition: A | Discharge status: Home / Self Care | Payer: Medicare Other | Source: Ambulatory Visit | Attending: Internal Medicine | Admitting: Internal Medicine

## 2016-05-29 DIAGNOSIS — I272 Other secondary pulmonary hypertension: Secondary | ICD-10-CM | POA: Insufficient documentation

## 2016-05-29 DIAGNOSIS — J984 Other disorders of lung: Secondary | ICD-10-CM | POA: Insufficient documentation

## 2016-05-29 DIAGNOSIS — J449 Chronic obstructive pulmonary disease, unspecified: Secondary | ICD-10-CM | POA: Insufficient documentation

## 2016-05-29 DIAGNOSIS — R9089 Other abnormal findings on diagnostic imaging of central nervous system: Secondary | ICD-10-CM | POA: Diagnosis not present

## 2016-05-29 DIAGNOSIS — C3490 Malignant neoplasm of unspecified part of unspecified bronchus or lung: Secondary | ICD-10-CM | POA: Diagnosis not present

## 2016-05-29 DIAGNOSIS — I2723 Pulmonary hypertension due to lung diseases and hypoxia: Secondary | ICD-10-CM

## 2016-05-29 DIAGNOSIS — R918 Other nonspecific abnormal finding of lung field: Secondary | ICD-10-CM

## 2016-05-29 MED ORDER — GADOBENATE DIMEGLUMINE 529 MG/ML IV SOLN
12.0000 mL | Freq: Once | INTRAVENOUS | Status: AC | PRN
  Administered 2016-05-29: 12 mL via INTRAVENOUS

## 2016-05-31 ENCOUNTER — Ambulatory Visit: Payer: Self-pay | Admitting: Adult Health

## 2016-05-31 ENCOUNTER — Ambulatory Visit: Payer: Self-pay | Admitting: Internal Medicine

## 2016-05-31 DIAGNOSIS — J449 Chronic obstructive pulmonary disease, unspecified: Secondary | ICD-10-CM | POA: Diagnosis not present

## 2016-06-01 ENCOUNTER — Telehealth: Payer: Self-pay | Admitting: Radiation Oncology

## 2016-06-01 NOTE — Telephone Encounter (Signed)
Originally patient scheduled for a follow up appointment with Dr. Tammi Klippel on Thursday, August 31. Per physician order appointment to be rescheduled. Appointment moved to September 7th at 1330. Phoned patient's home. Spoke with patient and confirmed new appointment date and time.

## 2016-06-03 ENCOUNTER — Encounter: Payer: Self-pay | Admitting: *Deleted

## 2016-06-03 ENCOUNTER — Telehealth: Payer: Self-pay | Admitting: Internal Medicine

## 2016-06-03 ENCOUNTER — Ambulatory Visit: Payer: Self-pay | Admitting: Radiation Oncology

## 2016-06-03 NOTE — Progress Notes (Signed)
Oncology Nurse Navigator Documentation  Oncology Nurse Navigator Flowsheets 06/03/2016  Navigator Encounter Type Other/I followed up with Dr. Julien Nordmann on when to schedule Phyllis [name].  He would like to see her week on 06/14/16.  I completed LOS to be scheduled.    Treatment Phase Pre-Tx/Tx Discussion  Barriers/Navigation Needs Coordination of Care  Interventions Coordination of Care  Coordination of Care Appts  Acuity Level 2  Acuity Level 2 Assistance expediting appointments  Time Spent with Patient 30

## 2016-06-03 NOTE — Telephone Encounter (Signed)
Spoke with pt to confirm 9/12 appt per LOS

## 2016-06-08 ENCOUNTER — Ambulatory Visit (INDEPENDENT_AMBULATORY_CARE_PROVIDER_SITE_OTHER): Payer: Medicare Other | Admitting: Adult Health

## 2016-06-08 ENCOUNTER — Encounter: Payer: Self-pay | Admitting: Adult Health

## 2016-06-08 DIAGNOSIS — IMO0001 Reserved for inherently not codable concepts without codable children: Secondary | ICD-10-CM

## 2016-06-08 DIAGNOSIS — J449 Chronic obstructive pulmonary disease, unspecified: Secondary | ICD-10-CM

## 2016-06-08 DIAGNOSIS — C3410 Malignant neoplasm of upper lobe, unspecified bronchus or lung: Secondary | ICD-10-CM

## 2016-06-08 DIAGNOSIS — Z23 Encounter for immunization: Secondary | ICD-10-CM | POA: Diagnosis not present

## 2016-06-08 DIAGNOSIS — C3431 Malignant neoplasm of lower lobe, right bronchus or lung: Secondary | ICD-10-CM | POA: Insufficient documentation

## 2016-06-08 NOTE — Progress Notes (Signed)
Subjective:    Patient ID: Phyllis [name], female    DOB: [birth year], 60 y.o.   MRN: [id number]  HPI  60 year old female former smoker followed for COPD and emphysema , hypertension, lung nodule, newly diagnosed Lung cancer (Squamous cell carcinoma) . On O2 at 3l/m   TEST  Pulmonary tests CT of Chest 03/29/15 >> moderate centrilobular emphysema, 6 mm LLL nodule, small pericardial effusion, trace R pleural fluid, cirrhosis, chronic calcific pancreatitis V/Q scan 04/01/15 >> low probability for PE  A1AT 04/09/15 >> 170, MM PFT 04/25/15 >> FEV1 0.92 (44%), FEV1% 50, TLC 4.05 (80%), DLCO 22%, no BD CT chest 03/30/16 >> advanced emphysema, 2.1 cm nodule LLL, 1.1 cm RLL nodule, moderate cirrhosis, chronic pancreatitis  Cardiac tests Echo 03/28/15 >> EF 65 to 70%, PAH 67 mmHg   06/08/2016 Follow-up lung nodule and biopsy/COPD  Patient returns for a post hospital follow-up. Patient has a known RLL and LLL lung nodule measuring 21 mm. PET scan showed hypermetabolic activity in a 1.5 right lower lobe lung nodule and a 1.6 left lower lobe lung nodule. Along with the left upper hilar node versus nodule. It went biopsy on 05/27/2016. 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 She has been made upcoming appointments with radiation oncology and and Dr. Earlie Server. She denies chest pain, hemoptysis , wt loss or edema.     Past Medical History:  Diagnosis Date  . ASTHMA 06/06/2009  . BACK PAIN 08/06/2010  . Chronic pancreatitis ([place])   . COPD with emphysema ([place])   . DEPRESSION 06/06/2009   states she is not depressed  . Diabetes mellitus ([place])   . Eczema   . Edema 05/20/2010  . GERD 06/06/2009  . HEPATITIS C WITHOUT HEPATIC COMA 06/06/2009  . HYPERTENSION 06/06/2009  . OSTEOPOROSIS 06/06/2009  . 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 TOBACCO USE 06/05/2010  . Unspecified hypothyroidism 06/05/2010  . VITAMIN D DEFICIENCY 06/06/2009   Current Outpatient  Prescriptions on File Prior to Visit  Medication Sig Dispense Refill  . Albuterol Sulfate (PROAIR RESPICLICK) 007 (90 BASE) MCG/ACT AEPB Inhale 1 puff into the lungs 4 (four) times daily as needed (For shortness of breath.). 1 each 0  . ALPRAZolam (XANAX) 0.5 MG tablet Take 1 tablet (0.5 mg total) by mouth 3 (three) times daily as needed. (Patient taking differently: Take 0.5 mg by mouth 3 (three) times daily as needed for anxiety. ) 90 tablet 2  . aspirin EC 81 MG tablet Take 81 mg by mouth at bedtime.     [name] atorvastatin (LIPITOR) 40 MG tablet TAKE 1 TABLET(40 MG) BY MOUTH DAILY 6 PM 30 tablet 11  . budesonide (PULMICORT) 0.5 MG/2ML nebulizer solution Take 2 mLs (0.5 mg total) by nebulization 2 (two) times daily. 60 mL 12  . carvedilol (COREG) 6.25 MG tablet Take 1 tablet (6.25 mg total) by mouth 2 (two) times daily with a meal. 60 tablet 5  . gabapentin (NEURONTIN) 300 MG capsule TAKE 1 CAPSULE BY MOUTH TWICE DAILY (Patient taking differently: TAKE 1 CAPSULE (300 mg) BY MOUTH TWICE DAILY) 60 capsule 3  . ipratropium-albuterol (DUONEB) 0.5-2.5 (3) MG/3ML SOLN Take 3 mLs by nebulization 3 (three) times daily. 360 mL 3  . oxyCODONE (ROXICODONE) 15 MG immediate release tablet Take 1 tablet (15 mg total) by mouth every 6 (six) hours as needed. For pain. 120 tablet 0  . potassium chloride SA (KLOR-CON M20) 20 MEQ tablet Take 1 tablet (20 mEq total) by mouth  2 (two) times daily. 60 tablet 2  . triamcinolone ointment (KENALOG) 0.1 % Apply 1 application topically 2 (two) times daily. 30 g 0  . triamterene-hydrochlorothiazide (MAXZIDE-25) 37.5-25 MG tablet Take 1 tablet by mouth daily. 90 tablet 1  . sodium chloride (OCEAN) 0.65 % SOLN nasal spray Place 1 spray into both nostrils as needed for congestion. (Patient not taking: Reported on 06/08/2016) 480 mL 0   No current facility-administered medications on file prior to visit.        Review of Systems Constitutional:   No  weight loss, night sweats,   Fevers, chills,  +fatigue, or  lassitude.  HEENT:   No headaches,  Difficulty swallowing,  Tooth/dental problems, or  Sore throat,                No sneezing, itching, ear ache, nasal congestion, post nasal drip,   CV:  No chest pain,  Orthopnea, PND, swelling in lower extremities, anasarca, dizziness, palpitations, syncope.   GI  No heartburn, indigestion, abdominal pain, nausea, vomiting, diarrhea, change in bowel habits, loss of appetite, bloody stools.   Resp:    No chest wall deformity  Skin: no rash or lesions.  GU: no dysuria, change in color of urine, no urgency or frequency.  No flank pain, no hematuria   MS:  No joint pain or swelling.  No decreased range of motion.  No back pain.  Psych:  No change in mood or affect. No depression or anxiety.  No memory loss.         Objective:   Physical Exam Vitals:   06/08/16 1441  BP: 136/80  Pulse: 71  Temp: 98.2 F (36.8 C)  TempSrc: Oral  SpO2: 95%  Weight: 140 lb (63.5 kg)  Height: _0  (1.676 m)    GEN: A/Ox3; pleasant , NAD, well nourished , on O2   HEENT:  [place]/AT,  EACs-clear, TMs-wnl, NOSE-clear, THROAT-clear, no lesions, no postnasal drip or exudate noted.   NECK:  Supple w/ fair ROM; no JVD; normal carotid impulses w/o bruits; no thyromegaly or nodules palpated; no lymphadenopathy.    RESP  Few trace rhonchi , no  accessory muscle use, no dullness to percussion  CARD:  RRR, no m/r/g  , no peripheral edema, pulses intact, no cyanosis or clubbing.  GI:   Soft & nt; nml bowel sounds; no organomegaly or masses detected.   Musco: Warm bil, no deformities or joint swelling noted.   Neuro: alert, no focal deficits noted.    Skin: Warm, no lesions or rashes   Tammy Parrett NP-C  [facility]  06/08/2016        Assessment & Plan:

## 2016-06-08 NOTE — Assessment & Plan Note (Signed)
Newly dx Lung cancer -Squamous Cell carcinoma  Path results reviewed in detail with patient  Support provided.  She has been seen at [facility] clinic with scheduled follow up over next week with ONC and RAD/ONC .   Plan  Patient Instructions  Follow up with Oncology next week as planned Follow up with Radiation Oncology this week as planned Continue on current regimen .  Flu shot today.  Follow up Dr. Halford Chessman  In 3 months and As needed   Please contact office for sooner follow up if symptoms do not improve or worsen or seek emergency care

## 2016-06-08 NOTE — Assessment & Plan Note (Signed)
Compensated without flare   Plan Cont on current regimen

## 2016-06-08 NOTE — Progress Notes (Signed)
Reviewed and agree with assessment/plan.  Chesley Mires, MD [facility] 06/08/2016, 3:31 PM Pager:  [phone]

## 2016-06-08 NOTE — Patient Instructions (Addendum)
Follow up with Oncology next week as planned Follow up with Radiation Oncology this week as planned Continue on current regimen .  Flu shot today.  Follow up Dr. Halford Chessman  In 3 months and As needed   Please contact office for sooner follow up if symptoms do not improve or worsen or seek emergency care

## 2016-06-10 ENCOUNTER — Ambulatory Visit
Admission: RE | Admit: 2016-06-10 | Discharge: 2016-06-10 | Disposition: A | Discharge status: Home / Self Care | Payer: Medicare Other | Source: Ambulatory Visit | Attending: Radiation Oncology | Admitting: Radiation Oncology

## 2016-06-10 ENCOUNTER — Encounter: Payer: Self-pay | Admitting: Radiation Oncology

## 2016-06-10 VITALS — BP 183/90 | HR 71 | Temp 98.5°F | Resp 16 | Ht 66.0 in | Wt 140.3 lb

## 2016-06-10 DIAGNOSIS — M7989 Other specified soft tissue disorders: Secondary | ICD-10-CM | POA: Diagnosis not present

## 2016-06-10 DIAGNOSIS — F329 Major depressive disorder, single episode, unspecified: Secondary | ICD-10-CM | POA: Insufficient documentation

## 2016-06-10 DIAGNOSIS — J449 Chronic obstructive pulmonary disease, unspecified: Secondary | ICD-10-CM | POA: Diagnosis not present

## 2016-06-10 DIAGNOSIS — E039 Hypothyroidism, unspecified: Secondary | ICD-10-CM | POA: Diagnosis not present

## 2016-06-10 DIAGNOSIS — C3432 Malignant neoplasm of lower lobe, left bronchus or lung: Secondary | ICD-10-CM | POA: Diagnosis not present

## 2016-06-10 DIAGNOSIS — E1142 Type 2 diabetes mellitus with diabetic polyneuropathy: Secondary | ICD-10-CM | POA: Diagnosis not present

## 2016-06-10 DIAGNOSIS — M810 Age-related osteoporosis without current pathological fracture: Secondary | ICD-10-CM | POA: Insufficient documentation

## 2016-06-10 DIAGNOSIS — E559 Vitamin D deficiency, unspecified: Secondary | ICD-10-CM | POA: Insufficient documentation

## 2016-06-10 DIAGNOSIS — Z9049 Acquired absence of other specified parts of digestive tract: Secondary | ICD-10-CM | POA: Insufficient documentation

## 2016-06-10 DIAGNOSIS — K219 Gastro-esophageal reflux disease without esophagitis: Secondary | ICD-10-CM | POA: Insufficient documentation

## 2016-06-10 DIAGNOSIS — E119 Type 2 diabetes mellitus without complications: Secondary | ICD-10-CM | POA: Diagnosis not present

## 2016-06-10 DIAGNOSIS — K861 Other chronic pancreatitis: Secondary | ICD-10-CM | POA: Insufficient documentation

## 2016-06-10 DIAGNOSIS — C3431 Malignant neoplasm of lower lobe, right bronchus or lung: Secondary | ICD-10-CM | POA: Diagnosis not present

## 2016-06-10 DIAGNOSIS — I10 Essential (primary) hypertension: Secondary | ICD-10-CM | POA: Diagnosis not present

## 2016-06-10 DIAGNOSIS — Z87891 Personal history of nicotine dependence: Secondary | ICD-10-CM | POA: Insufficient documentation

## 2016-06-10 DIAGNOSIS — C3410 Malignant neoplasm of upper lobe, unspecified bronchus or lung: Secondary | ICD-10-CM | POA: Insufficient documentation

## 2016-06-10 DIAGNOSIS — Z9981 Dependence on supplemental oxygen: Secondary | ICD-10-CM | POA: Diagnosis not present

## 2016-06-10 DIAGNOSIS — B1920 Unspecified viral hepatitis C without hepatic coma: Secondary | ICD-10-CM | POA: Diagnosis not present

## 2016-06-10 DIAGNOSIS — J439 Emphysema, unspecified: Secondary | ICD-10-CM | POA: Insufficient documentation

## 2016-06-10 NOTE — Progress Notes (Addendum)
Phyllis [name] is here for a follow up visit for lung cancer and evaluate brain metasis. Weight changes, if any: Wt Readings from Last 3 Encounters:  06/10/16 140 lb 4.8 oz (63.6 kg)  06/08/16 140 lb (63.5 kg)  05/27/16 138 lb (62.6 kg)   Respiratory complaints, if any: SOB using O2 at 3 L/min cont.,coughing non productive,wheezing at times Hemoptysis, if HTM:BPJP   Swallowing Problems/Pain/Difficulty swallowing:None Smoking Tobacco/Marijuana/Snuff/ETOH ETK:KOEC 04-03-15 cigarettes 1 p/dx35 years no drug or alcohol Skin:Normal color Pain : None Appetite:Good Fatigue:Having fatigue in the afternoon. When is next chemo scheduled?:No Lab work from of chart:05-27-16 PT INR,PTT, 05-20-16 CBCw diff,CMET BP (!) 183/90 (BP Location: Right Arm, Patient Position: Sitting, Cuff Size: Normal)   Pulse 71   Temp 98.5 F (36.9 C) (Oral)   Resp 16   Ht _0  (1.676 m)   Wt 140 lb 4.8 oz (63.6 kg)   LMP 10/05/1999   SpO2 100%   BMI 22.65 kg/m         1333 B/P 180/84 pulse 66

## 2016-06-10 NOTE — Addendum Note (Signed)
Encounter addended by: Heywood Footman, RN on: 06/10/2016  3:31 PM<BR>    Actions taken: Charge Capture section accepted

## 2016-06-10 NOTE — Progress Notes (Signed)
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 Malignant neoplasm of upper lobe of lung, unspecified laterality ([place]) 162.3 C34.10     Narrative:  The patient returns today for follow-up.The patient has been followed with CT imaging after a pulmonary nodule was found a few years ago. She had a CT without contrast on 03/30/16 which revealed a 2.1 x 1.2 cm left lower lobe lesion which had increased in size since 2016 when it had been 6 mm. A new nodule in the right lower lobe measured 1.1 x 0.9 cm. She was counseled on a PET CT which was performed on 05/14/2016 revealed hypermetabolic uptake in the right lower lobe nodule with an SUV of 11.3, it measured 1.5 x 1.1 cm. The left lower lobe lesion measured 1.6 x 1.7 cm and had an SUV of 10.5. A left upper hilar node was present with a SUV of 5.6. A small lymph node below the bronchus intermedius with an SUV of 4.6   The patient was originally seen by Dr. Tammi Klippel in [facility] on 05/20/2016. She has since undergone biopsy of the LLL lesion on 05/27/16 which revealed a poorly differentiated Squamous Cell Carcinoma. She also had an MRI of the brain on 05/31/16 that did not reveal evidence of metastatic disease.  She returns today for further discussion of SBRT radiation treatment in the management of her disease.  On review of systems, the patient reports that she is doing well overall. She denies any chest pain, fevers, chills, night sweats, unintended weight changes. She reports shortness of breath, using O2 at 3 L/min continuous. She reports non-productive coughing and wheezing at times. She denies any bowel or bladder disturbances, and denies abdominal  pain, nausea or vomiting. She denies any new musculoskeletal or joint aches or pains. She reports fatigue in the afternoon. A complete review of systems is obtained and is otherwise negative.   Past Medical History:  Past Medical History:  Diagnosis Date  . ASTHMA 06/06/2009  . BACK PAIN 08/06/2010  . Chronic pancreatitis ([place])   . COPD with emphysema ([place])   . DEPRESSION 06/06/2009   states she is not depressed  . Diabetes mellitus ([place])   . Eczema   . Edema 05/20/2010  . GERD 06/06/2009  . HEPATITIS C WITHOUT HEPATIC COMA 06/06/2009  . HYPERTENSION 06/06/2009  . OSTEOPOROSIS 06/06/2009  . 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 TOBACCO USE 06/05/2010  . Unspecified hypothyroidism 06/05/2010  . VITAMIN D DEFICIENCY 06/06/2009    Past Surgical History: Past Surgical History:  Procedure Laterality Date  . BREAST LUMPECTOMY     benign, right  . CHOLECYSTECTOMY    . LEFT HEART CATHETERIZATION WITH CORONARY ANGIOGRAM N/A 03/08/2013   Procedure: LEFT HEART CATHETERIZATION WITH CORONARY ANGIOGRAM;  Surgeon: Clent Demark, MD;  Location: [facility] C[facility] LAB;  Service: Cardiovascular;  Laterality: N/A;  . LUMBAR LAMINECTOMY    . TONSILLECTOMY    . TUBAL LIGATION      Social History:  Social History   Social History  . Marital status: Married    Spouse name: N/A  . Number of children: N/A  . Years of education:  N/A   Occupational History  . Not on file.   Social History Main Topics  . Smoking status: Former Smoker    Packs/day: 0.10    Years: 35.00    Types: Cigarettes    Quit date: 04/03/2015  . Smokeless tobacco: Never Used     Comment: Trying to quit;   . Alcohol use No  . Drug use: No  . Sexual activity: Yes    Birth control/ protection: Surgical   Other Topics Concern  . Not on file   Social History Narrative   Lives married; 3 children;     Family History: Family History  Problem Relation Age of Onset  . COPD Father   . Hypertension Mother   . Kidney  disease Mother   . Colon cancer Neg Hx   . Stomach cancer Neg Hx          ALLERGIES:  is allergic to amlodipine; meperidine hcl; and naproxen.  Meds: Current Outpatient Prescriptions  Medication Sig Dispense Refill  . Albuterol Sulfate (PROAIR RESPICLICK) 962 (90 BASE) MCG/ACT AEPB Inhale 1 puff into the lungs 4 (four) times daily as needed (For shortness of breath.). 1 each 0  . ALPRAZolam (XANAX) 0.5 MG tablet Take 1 tablet (0.5 mg total) by mouth 3 (three) times daily as needed. (Patient taking differently: Take 0.5 mg by mouth 3 (three) times daily as needed for anxiety. ) 90 tablet 2  . aspirin EC 81 MG tablet Take 81 mg by mouth at bedtime.     . budesonide (PULMICORT) 0.5 MG/2ML nebulizer solution Take 2 mLs (0.5 mg total) by nebulization 2 (two) times daily. 60 mL 12  . carvedilol (COREG) 6.25 MG tablet Take 1 tablet (6.25 mg total) by mouth 2 (two) times daily with a meal. 60 tablet 5  . gabapentin (NEURONTIN) 300 MG capsule TAKE 1 CAPSULE BY MOUTH TWICE DAILY (Patient taking differently: TAKE 1 CAPSULE (300 mg) BY MOUTH TWICE DAILY) 60 capsule 3  . ipratropium-albuterol (DUONEB) 0.5-2.5 (3) MG/3ML SOLN Take 3 mLs by nebulization 3 (three) times daily. 360 mL 3  . oxyCODONE (ROXICODONE) 15 MG immediate release tablet Take 1 tablet (15 mg total) by mouth every 6 (six) hours as needed. For pain. 120 tablet 0  . potassium chloride SA (KLOR-CON M20) 20 MEQ tablet Take 1 tablet (20 mEq total) by mouth 2 (two) times daily. 60 tablet 2  . sodium chloride (OCEAN) 0.65 % SOLN nasal spray Place 1 spray into both nostrils as needed for congestion. 480 mL 0  . triamcinolone ointment (KENALOG) 0.1 % Apply 1 application topically 2 (two) times daily. 30 g 0  . triamterene-hydrochlorothiazide (MAXZIDE-25) 37.5-25 MG tablet Take 1 tablet by mouth daily. 90 tablet 1  . atorvastatin (LIPITOR) 40 MG tablet TAKE 1 TABLET(40 MG) BY MOUTH DAILY 6 PM 30 tablet 11   No current facility-administered  medications for this encounter.     Physical Findings:  height is _0  (1.676 m) and weight is 140 lb 4.8 oz (63.6 kg). Her oral temperature is 98.5 F (36.9 C). Her blood pressure is 183/90 (abnormal) and her pulse is 71. Her respiration is 16 and oxygen saturation is 100%. .    In general this is a well appearing African-American female in no acute distress. She's alert and oriented x4 and appropriate throughout the examination. Cardiopulmonary assessment is negative for acute distress and she exhibits normal effort. Oxygen via nasal cannula at 3 L/min continuous.  Lab Findings: Lab Results  Component Value Date   WBC 6.1 05/27/2016   HGB 13.3 05/27/2016   HCT 40.9 05/27/2016   PLT 146 (L) 05/27/2016    Lab Results  Component Value Date   NA 129 (L) 05/17/2016   K 4.5 05/17/2016   CO2 35 (H) 05/17/2016   GLUCOSE 101 (H) 05/17/2016   BUN 6 05/17/2016   CREATININE 0.77 05/17/2016   BILITOT 0.5 12/02/2015   ALKPHOS 90 12/02/2015   AST 15 12/02/2015   ALT 9 12/02/2015   PROT 7.5 12/02/2015   ALBUMIN 4.1 12/02/2015   CALCIUM 9.1 05/17/2016   ANIONGAP 7 04/01/2015    Radiographic Findings: Dg Chest 2 View  Result Date: 05/28/2016 CLINICAL DATA:  Hemoptysis, cough, status post left lung biopsy yesterday EXAM: CHEST  2 VIEW COMPARISON:  Portable chest x-ray of May 28, 2019 portable chest x-ray of August twenty-fourth 2017 FINDINGS: The lungs are well-expanded. The interstitial markings are less conspicuous overall today. There remain near confluent interstitial densities in the lower 1/2 of the left lung with an area of confluence lateral to the cardiac apex. There is no pneumothorax or significant pleural effusion. The heart and pulmonary vascularity are normal. There is calcification in the wall of the aortic arch. IMPRESSION: COPD. Chronic interstitial prominence. Persistent abnormal increased density in the left lower lobe consistent with a known mass. No postprocedure  pneumothorax or hemothorax. Aortic atherosclerosis. Electronically Signed   By: David  Martinique M.D.   On: 05/28/2016 07:19   Mr Jeri Cos NW Contrast  Result Date: 05/29/2016 CLINICAL DATA:  60 y/o F; history of lung cancer to evaluate brain metastasis. EXAM: MRI HEAD WITHOUT AND WITH CONTRAST TECHNIQUE: Multiplanar, multiecho pulse sequences of the brain and surrounding structures were obtained without and with intravenous contrast. CONTRAST:  41m MULTIHANCE GADOBENATE DIMEGLUMINE 529 MG/ML IV SOLN COMPARISON:  02/22/2014 MRI brain. FINDINGS: Brain: No diffusion restriction to suggest acute infarct. No abnormal susceptibility hypointensity to indicate intracranial hemorrhage. Foci of T2 FLAIR hyperintensity are seen in the periventricular white matter, some with the radial configuration. There is a right paramedian anterior frontal juxta cortical lesion. And left superior frontal gyrus juxta cortical lesion. The size and number of lesions is grossly stable. The left mid corona radiata lesion has a punctate central focus of enhancement probably representing a vessel. No focal mass effect. Some lesions demonstrate low T1 signal. No abnormal enhancement to suggest intracranial metastatic disease. Extra-axial space: No hydrocephalus. No extra-axial collection is identified. Proximal intracranial flow voids are maintained. Other: No abnormal signal of the paranasal sinuses. No abnormal signal of the mastoid air cells. Orbits are unremarkable. Calvarium is unremarkable. IMPRESSION: 1. No abnormal enhancement to suggest intracranial metastatic disease. 2. Stable nonspecific foci of T2 FLAIR hyperintensity in a pattern most consistent with demyelination and multiple sclerosis. Differential considerations include chronic microvascular ischemia, vasculitis, or as the sequelae of a prior infectious or inflammatory process. Electronically Signed   By: LKristine GarbeM.D.   On: 05/29/2016 18:04   Nm Pet Image  Initial (pi) Skull Base To Thigh  Result Date: 05/14/2016 CLINICAL DATA:  Initial treatment strategy for left lower lobe lung nodule and right lower lobe lung nodule. EXAM: NUCLEAR MEDICINE PET SKULL BASE TO THIGH TECHNIQUE: 7.0 mCi F-18 FDG was injected intravenously. Full-ring PET imaging was performed from the skull base to thigh after the radiotracer. CT data was obtained and used for attenuation correction and anatomic localization. FASTING BLOOD GLUCOSE:  Value: 156 mg/dl COMPARISON:  Multiple exams, including  chest CT of 03/30/2016 FINDINGS: NECK No hypermetabolic lymph nodes in the neck. Periapical lucency involving a right anterior mandibular tooth, possibly a canine, with faint hypermetabolic activity which is thought to be inflammatory. CHEST 1.5 by 1.1 cm right lower lobe pulmonary nodule image 46/8, maximum standard uptake value 11.3. Left lower lobe 1.6 by 1.7 cm pulmonary nodule with adjacent distal passive atelectasis, maximum standard uptake value 10.5. Left upper hilar node or nodule, maximum standard uptake value 5.6. Small lymph node below the bronchus intermedius, maximum standard uptake value 4.6. Severe centrilobular emphysema. Volume loss and some vascular prominence in the left lower lobe. Bilateral airway thickening. ABDOMEN/PELVIS Physiologic activity in stomach and small and large bowel. No findings of metastatic disease to the abdomen. Lobular liver contour. Extensive calcification in the pancreas compatible chronic calcific pancreatitis. Aortoiliac atherosclerotic vascular disease. Right kidney lower pole hypodense lesion, photopenic, likely a cyst. SKELETON No focal hypermetabolic activity to suggest skeletal metastasis. IMPRESSION: 1. Abnormal hypermetabolic single pulmonary nodules in both lower lobes along with small hypermetabolic left upper hilar lymph node and faintly hypermetabolic lymph node just below the bronchus intermedius. Appearance favors malignancy, active  granulomatous disease considered possible but less likely. Tissue diagnosis recommended. 2. Severe centrilobular emphysema. 3. Airway thickening is present, suggesting bronchitis or reactive airways disease. 4. Other imaging findings of potential clinical significance: Lobular liver contour, possible cirrhosis. Chronic calcific pancreatitis. Aortoiliac atherosclerotic vascular disease. Right kidney lower pole cyst. Periapical lucency involving a right anterior mandibular tooth, possibly the canine. Electronically Signed   By: Van Clines M.D.   On: 05/14/2016 11:46   Ct Biopsy  Result Date: 05/27/2016 INDICATION: Hypermetabolic bilateral lower lobe pulmonary nodules, larger on the left. EXAM: CT-GUIDED BIOPSY LEFT LOWER LOBE PULMONARY NODULE MEDICATIONS: 1% LIDOCAINE LOCALLY ANESTHESIA/SEDATION: 2.0 mg IV Versed; 200 mcg IV Fentanyl Moderate Sedation Time:  60 MINUTES The patient was continuously monitored during the procedure by the interventional radiology nurse under my direct supervision. PROCEDURE: The procedure, risks, benefits, and alternatives were explained to the patient. Questions regarding the procedure were encouraged and answered. The patient understands and consents to the procedure. Previous imaging reviewed. Patient positioned supine. Noncontrast localization CT performed through the chest with and left arm up. The left lower lobe peripheral subpleural pulmonary nodule was localized. Overlying skin was marked. The LEFT LATERAL CHEST was prepped with CHLORAPREP in a sterile fashion, and a sterile drape was applied covering the operative field. A sterile gown and sterile gloves were used for the procedure. Under CT guidance, a(n) 17 gauge guide needle was advanced into the left lower lobe pulmonary mass. 2 18 gauge core biopsies were obtained. Biosentry occlusion device utilized to aide in pneumothorax prevention upon removing the guide needle. Final imaging was performed. Vital sign  monitoring by nursing staff during the procedure will continue as patient is in the special procedures unit for post procedure observation. FINDINGS: The images document guide needle placement within the left lower lobe nodule. Post biopsy images demonstrate acute left lower lobe moderate to large pulmonary hemorrhage. No pneumothorax or effusion. COMPLICATIONS: SIR LEVEL B - Normal therapy, includes overnight admission for observation. IMPRESSION: Successful CT-guided core biopsy of the left lower lobe nodule Electronically Signed   By: Jerilynn Mages.  Shick M.D.   On: 05/27/2016 14:23   Dg Chest Port 1 View  Result Date: 05/27/2016 CLINICAL DATA:  Status post lung biopsy. EXAM: PORTABLE CHEST 1 VIEW COMPARISON:  Radiographs of April 01, 2015. FINDINGS: The heart size and mediastinal contours are  within normal limits. No pneumothorax or pleural effusion is noted. Ill-defined density is seen in lingular region consistent with neoplasm and associated pulmonary hemorrhage. The visualized skeletal structures are unremarkable. IMPRESSION: No pneumothorax status post left lung biopsy. Pulmonary hemorrhage is noted. Electronically Signed   By: Marijo Conception, M.D.   On: 05/27/2016 14:08    Impression: Ms. 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  Plan:  Dr. Tammi Klippel spoke with the patient about the findings and work-up thus far.  We discussed the natural history of squamous cell lung cancer and general treatment, highlighting the role of SBRT in the management.  We discussed the available radiation techniques, and focused on the details of logistics and delivery.  We reviewed the anticipated acute and late sequelae associated with radiation in this setting.  Dr. Tammi Klippel recommends proceeding with 3-5 fractions. The patient would like to proceed with SBRT and is scheduled for CT simulation next  Friday.  Follow up scheduled with Dr. Julien Nordmann on 06/15/2016.   The above documentation reflects my direct findings during this shared patient visit. Please see the separate note by Dr. Tammi Klippel on this date for the remainder of the patient's plan of care.     Carola Rhine, PAC    This document serves as a record of services personally performed by [name], MD and Shona Simpson, PA-C. It was created on his behalf by Arlyce Harman, a trained medical scribe. The creation of this record is based on the scribe's personal observations and the provider's statements to them. This document has been checked and approved by the attending provider.

## 2016-06-15 ENCOUNTER — Telehealth: Payer: Self-pay | Admitting: Internal Medicine

## 2016-06-15 ENCOUNTER — Ambulatory Visit (HOSPITAL_BASED_OUTPATIENT_CLINIC_OR_DEPARTMENT_OTHER): Payer: Medicare Other | Admitting: Internal Medicine

## 2016-06-15 ENCOUNTER — Encounter: Payer: Self-pay | Admitting: Internal Medicine

## 2016-06-15 ENCOUNTER — Other Ambulatory Visit (HOSPITAL_BASED_OUTPATIENT_CLINIC_OR_DEPARTMENT_OTHER): Payer: Medicare Other

## 2016-06-15 DIAGNOSIS — I10 Essential (primary) hypertension: Secondary | ICD-10-CM | POA: Diagnosis not present

## 2016-06-15 DIAGNOSIS — C3432 Malignant neoplasm of lower lobe, left bronchus or lung: Secondary | ICD-10-CM

## 2016-06-15 DIAGNOSIS — R918 Other nonspecific abnormal finding of lung field: Secondary | ICD-10-CM

## 2016-06-15 DIAGNOSIS — C3492 Malignant neoplasm of unspecified part of left bronchus or lung: Secondary | ICD-10-CM

## 2016-06-15 DIAGNOSIS — J449 Chronic obstructive pulmonary disease, unspecified: Secondary | ICD-10-CM

## 2016-06-15 DIAGNOSIS — C3431 Malignant neoplasm of lower lobe, right bronchus or lung: Secondary | ICD-10-CM

## 2016-06-15 DIAGNOSIS — IMO0002 Reserved for concepts with insufficient information to code with codable children: Secondary | ICD-10-CM

## 2016-06-15 DIAGNOSIS — I2723 Pulmonary hypertension due to lung diseases and hypoxia: Secondary | ICD-10-CM

## 2016-06-15 HISTORY — DX: Reserved for concepts with insufficient information to code with codable children: IMO0002

## 2016-06-15 LAB — COMPREHENSIVE METABOLIC PANEL
ALT: 11 U/L (ref 0–55)
AST: 16 U/L (ref 5–34)
Albumin: 3.2 g/dL — ABNORMAL LOW (ref 3.5–5.0)
Alkaline Phosphatase: 79 U/L (ref 40–150)
Anion Gap: 7 mEq/L (ref 3–11)
BUN: 5.4 mg/dL — AB (ref 7.0–26.0)
CO2: 30 meq/L — AB (ref 22–29)
Calcium: 9.1 mg/dL (ref 8.4–10.4)
Chloride: 94 mEq/L — ABNORMAL LOW (ref 98–109)
Creatinine: 0.9 mg/dL (ref 0.6–1.1)
EGFR: 84 mL/min/{1.73_m2} — AB (ref >90)
GLUCOSE: 151 mg/dL — AB (ref 70–140)
Potassium: 4 mEq/L (ref 3.5–5.1)
SODIUM: 131 meq/L — AB (ref 136–145)
TOTAL PROTEIN: 6.3 g/dL — AB (ref 6.4–8.3)

## 2016-06-15 LAB — CBC WITH DIFFERENTIAL/PLATELET
BASO%: 0.3 % (ref 0.0–2.0)
Basophils Absolute: 0 10*3/uL (ref 0.0–0.1)
EOS%: 0.8 % (ref 0.0–7.0)
Eosinophils Absolute: 0 10*3/uL (ref 0.0–0.5)
HCT: 40.2 % (ref 34.8–46.6)
HEMOGLOBIN: 13.1 g/dL (ref 11.6–15.9)
LYMPH%: 18.9 % (ref 14.0–49.7)
MCH: 31.5 pg (ref 25.1–34.0)
MCHC: 32.6 g/dL (ref 31.5–36.0)
MCV: 96.5 fL (ref 79.5–101.0)
MONO#: 0.5 10*3/uL (ref 0.1–0.9)
MONO%: 7.4 % (ref 0.0–14.0)
NEUT%: 72.6 % (ref 38.4–76.8)
NEUTROS ABS: 4.4 10*3/uL (ref 1.5–6.5)
Platelets: 214 10*3/uL (ref 145–400)
RBC: 4.17 10*6/uL (ref 3.70–5.45)
RDW: 14.5 % (ref 11.2–14.5)
WBC: 6.1 10*3/uL (ref 3.9–10.3)
lymph#: 1.2 10*3/uL (ref 0.9–3.3)

## 2016-06-15 NOTE — Progress Notes (Signed)
[place] [phone]   [phone]  OFFICE PROGRESS NOTE  [name], MD [address] [facility] [address] [place] [address]  DIAGNOSIS: Synchronous stage IA (T1a, N0, M0) non-small cell lung cancer presented with bilateral pulmonary nodules in the left lower lobe as well as the right lower lobe. The patient also has questionable left hilar lymphadenopathy diagnosed in August 2017.  PRIOR THERAPY: None  CURRENT THERAPY: Stereotactic radiotherapy to the bilateral pulmonary nodules.  INTERVAL HISTORY: Phyllis [name] 60 y.o. female returns to the clinic today for follow-up visit accompanied by her husband. The patient is feeling fine with no specific complaints except for the baseline shortness breath and she is currently on home oxygen. She denied having any significant chest pain, cough or hemoptysis. She has no significant weight loss or night sweats. She has no nausea or vomiting. She had CT-guided core biopsy of the left lower lobe lung nodule by interventional radiology and the patient is here today for evaluation and discussion of her treatment options. She was seen by Dr. Tammi Klippel and expected to start the stereotactic radiotherapy to the bilateral pulmonary nodules next week.  MEDICAL HISTORY: Past Medical History:  Diagnosis Date  . ASTHMA 06/06/2009  . BACK PAIN 08/06/2010  . Chronic pancreatitis ([place])   . COPD with emphysema ([place])   . DEPRESSION 06/06/2009   states she is not depressed  . Diabetes mellitus ([place])   . Eczema   . Edema 05/20/2010  . GERD 06/06/2009  . HEPATITIS C WITHOUT HEPATIC COMA 06/06/2009  . HYPERTENSION 06/06/2009  . OSTEOPOROSIS 06/06/2009  . 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 TOBACCO USE 06/05/2010  . Unspecified hypothyroidism 06/05/2010  . VITAMIN D DEFICIENCY 06/06/2009    ALLERGIES:  is allergic to amlodipine; meperidine hcl; and naproxen.  MEDICATIONS:  Current Outpatient Prescriptions   Medication Sig Dispense Refill  . Albuterol Sulfate (PROAIR RESPICLICK) 017 (90 BASE) MCG/ACT AEPB Inhale 1 puff into the lungs 4 (four) times daily as needed (For shortness of breath.). 1 each 0  . ALPRAZolam (XANAX) 0.5 MG tablet Take 1 tablet (0.5 mg total) by mouth 3 (three) times daily as needed. (Patient taking differently: Take 0.5 mg by mouth 3 (three) times daily as needed for anxiety. ) 90 tablet 2  . aspirin EC 81 MG tablet Take 81 mg by mouth at bedtime.     [name] atorvastatin (LIPITOR) 40 MG tablet TAKE 1 TABLET(40 MG) BY MOUTH DAILY 6 PM 30 tablet 11  . budesonide (PULMICORT) 0.5 MG/2ML nebulizer solution Take 2 mLs (0.5 mg total) by nebulization 2 (two) times daily. 60 mL 12  . carvedilol (COREG) 6.25 MG tablet Take 1 tablet (6.25 mg total) by mouth 2 (two) times daily with a meal. 60 tablet 5  . gabapentin (NEURONTIN) 300 MG capsule TAKE 1 CAPSULE BY MOUTH TWICE DAILY (Patient taking differently: TAKE 1 CAPSULE (300 mg) BY MOUTH TWICE DAILY) 60 capsule 3  . ipratropium-albuterol (DUONEB) 0.5-2.5 (3) MG/3ML SOLN Take 3 mLs by nebulization 3 (three) times daily. 360 mL 3  . oxyCODONE (ROXICODONE) 15 MG immediate release tablet Take 1 tablet (15 mg total) by mouth every 6 (six) hours as needed. For pain. 120 tablet 0  . potassium chloride SA (KLOR-CON M20) 20 MEQ tablet Take 1 tablet (20 mEq total) by mouth 2 (two) times daily. 60 tablet 2  . sodium chloride (OCEAN) 0.65 % SOLN nasal spray Place 1 spray into both  nostrils as needed for congestion. 480 mL 0  . triamcinolone ointment (KENALOG) 0.1 % Apply 1 application topically 2 (two) times daily. 30 g 0  . triamterene-hydrochlorothiazide (MAXZIDE-25) 37.5-25 MG tablet Take 1 tablet by mouth daily. 90 tablet 1   No current facility-administered medications for this visit.     SURGICAL HISTORY:  Past Surgical History:  Procedure Laterality Date  . BREAST LUMPECTOMY     benign, right  . CHOLECYSTECTOMY    . LEFT HEART CATHETERIZATION  WITH CORONARY ANGIOGRAM N/A 03/08/2013   Procedure: LEFT HEART CATHETERIZATION WITH CORONARY ANGIOGRAM;  Surgeon: Clent Demark, MD;  Location: [facility] CATH LAB;  Service: Cardiovascular;  Laterality: N/A;  . LUMBAR LAMINECTOMY    . TONSILLECTOMY    . TUBAL LIGATION      REVIEW OF SYSTEMS:  Constitutional: negative Eyes: negative Ears, nose, mouth, throat, and face: negative Respiratory: positive for dyspnea on exertion Cardiovascular: negative Gastrointestinal: negative Genitourinary:negative Integument/breast: negative Hematologic/lymphatic: negative Musculoskeletal:negative Neurological: negative Behavioral/Psych: negative Endocrine: negative Allergic/Immunologic: negative   PHYSICAL EXAMINATION: General appearance: alert, cooperative and no distress Head: Normocephalic, without obvious abnormality, atraumatic Neck: no adenopathy, no JVD, supple, symmetrical, trachea midline and thyroid not enlarged, symmetric, no tenderness/mass/nodules Lymph nodes: Cervical, supraclavicular, and axillary nodes normal. Resp: wheezes bilaterally Back: symmetric, no curvature. ROM normal. No CVA tenderness. Cardio: regular rate and rhythm, S1, S2 normal, no murmur, click, rub or gallop GI: soft, non-tender; bowel sounds normal; no masses,  no organomegaly Extremities: extremities normal, atraumatic, no cyanosis or edema Neurologic: Alert and oriented X 3, normal strength and tone. Normal symmetric reflexes. Normal coordination and gait  ECOG PERFORMANCE STATUS: 1 - Symptomatic but completely ambulatory  Blood pressure (!) 163/75, pulse 77, temperature 97.7 F (36.5 C), temperature source Oral, resp. rate 18, height _0  (1.676 m), weight 140 lb 12.8 oz (63.9 kg), last menstrual period 10/05/1999, SpO2 96 %.  LABORATORY DATA: Lab Results  Component Value Date   WBC 6.1 06/15/2016   HGB 13.1 06/15/2016   HCT 40.2 06/15/2016   MCV 96.5 06/15/2016   PLT 214 06/15/2016      Chemistry        Component Value Date/Time   NA 131 (L) 06/15/2016 0923   K 4.0 06/15/2016 0923   CL 91 (L) 05/17/2016 1450   CO2 30 (H) 06/15/2016 0923   BUN 5.4 (L) 06/15/2016 0923   CREATININE 0.9 06/15/2016 0923      Component Value Date/Time   CALCIUM 9.1 06/15/2016 0923   ALKPHOS 79 06/15/2016 0923   AST 16 06/15/2016 0923   ALT 11 06/15/2016 0923   BILITOT <0.30 06/15/2016 8119       RADIOGRAPHIC STUDIES: Dg Chest 2 View  Result Date: 05/28/2016 CLINICAL DATA:  Hemoptysis, cough, status post left lung biopsy yesterday EXAM: CHEST  2 VIEW COMPARISON:  Portable chest x-ray of May 28, 2019 portable chest x-ray of August twenty-fourth 2017 FINDINGS: The lungs are well-expanded. The interstitial markings are less conspicuous overall today. There remain near confluent interstitial densities in the lower 1/2 of the left lung with an area of confluence lateral to the cardiac apex. There is no pneumothorax or significant pleural effusion. The heart and pulmonary vascularity are normal. There is calcification in the wall of the aortic arch. IMPRESSION: COPD. Chronic interstitial prominence. Persistent abnormal increased density in the left lower lobe consistent with a known mass. No postprocedure pneumothorax or hemothorax. Aortic atherosclerosis. Electronically Signed   By: David  Martinique M.D.  On: 05/28/2016 07:19   Mr Jeri Cos WK Contrast  Result Date: 05/29/2016 CLINICAL DATA:  60 y/o F; history of lung cancer to evaluate brain metastasis. EXAM: MRI HEAD WITHOUT AND WITH CONTRAST TECHNIQUE: Multiplanar, multiecho pulse sequences of the brain and surrounding structures were obtained without and with intravenous contrast. CONTRAST:  32m MULTIHANCE GADOBENATE DIMEGLUMINE 529 MG/ML IV SOLN COMPARISON:  02/22/2014 MRI brain. FINDINGS: Brain: No diffusion restriction to suggest acute infarct. No abnormal susceptibility hypointensity to indicate intracranial hemorrhage. Foci of T2 FLAIR hyperintensity are  seen in the periventricular white matter, some with the radial configuration. There is a right paramedian anterior frontal juxta cortical lesion. And left superior frontal gyrus juxta cortical lesion. The size and number of lesions is grossly stable. The left mid corona radiata lesion has a punctate central focus of enhancement probably representing a vessel. No focal mass effect. Some lesions demonstrate low T1 signal. No abnormal enhancement to suggest intracranial metastatic disease. Extra-axial space: No hydrocephalus. No extra-axial collection is identified. Proximal intracranial flow voids are maintained. Other: No abnormal signal of the paranasal sinuses. No abnormal signal of the mastoid air cells. Orbits are unremarkable. Calvarium is unremarkable. IMPRESSION: 1. No abnormal enhancement to suggest intracranial metastatic disease. 2. Stable nonspecific foci of T2 FLAIR hyperintensity in a pattern most consistent with demyelination and multiple sclerosis. Differential considerations include chronic microvascular ischemia, vasculitis, or as the sequelae of a prior infectious or inflammatory process. Electronically Signed   By: LKristine GarbeM.D.   On: 05/29/2016 18:04   Ct Biopsy  Result Date: 05/27/2016 INDICATION: Hypermetabolic bilateral lower lobe pulmonary nodules, larger on the left. EXAM: CT-GUIDED BIOPSY LEFT LOWER LOBE PULMONARY NODULE MEDICATIONS: 1% LIDOCAINE LOCALLY ANESTHESIA/SEDATION: 2.0 mg IV Versed; 200 mcg IV Fentanyl Moderate Sedation Time:  60 MINUTES The patient was continuously monitored during the procedure by the interventional radiology nurse under my direct supervision. PROCEDURE: The procedure, risks, benefits, and alternatives were explained to the patient. Questions regarding the procedure were encouraged and answered. The patient understands and consents to the procedure. Previous imaging reviewed. Patient positioned supine. Noncontrast localization CT performed  through the chest with and left arm up. The left lower lobe peripheral subpleural pulmonary nodule was localized. Overlying skin was marked. The LEFT LATERAL CHEST was prepped with CHLORAPREP in a sterile fashion, and a sterile drape was applied covering the operative field. A sterile gown and sterile gloves were used for the procedure. Under CT guidance, a(n) 17 gauge guide needle was advanced into the left lower lobe pulmonary mass. 2 18 gauge core biopsies were obtained. Biosentry occlusion device utilized to aide in pneumothorax prevention upon removing the guide needle. Final imaging was performed. Vital sign monitoring by nursing staff during the procedure will continue as patient is in the special procedures unit for post procedure observation. FINDINGS: The images document guide needle placement within the left lower lobe nodule. Post biopsy images demonstrate acute left lower lobe moderate to large pulmonary hemorrhage. No pneumothorax or effusion. COMPLICATIONS: SIR LEVEL B - Normal therapy, includes overnight admission for observation. IMPRESSION: Successful CT-guided core biopsy of the left lower lobe nodule Electronically Signed   By: MJerilynn Mages  Shick M.D.   On: 05/27/2016 14:23   Dg Chest Port 1 View  Result Date: 05/27/2016 CLINICAL DATA:  Status post lung biopsy. EXAM: PORTABLE CHEST 1 VIEW COMPARISON:  Radiographs of April 01, 2015. FINDINGS: The heart size and mediastinal contours are within normal limits. No pneumothorax or pleural effusion is noted. Ill-defined  density is seen in lingular region consistent with neoplasm and associated pulmonary hemorrhage. The visualized skeletal structures are unremarkable. IMPRESSION: No pneumothorax status post left lung biopsy. Pulmonary hemorrhage is noted. Electronically Signed   By: Marijo Conception, M.D.   On: 05/27/2016 14:08    ASSESSMENT AND PLAN: This is a very pleasant 60 years old African-American female with bilateral synchronous non-small cell lung  cancer, squamous cell carcinoma diagnosed in August 2017. MRI of the brain showed no evidence for metastatic disease to the brain. I discussed the biopsy results with the patient today. I recommended for her to proceed with the stereotactic radiotherapy as recommended by Dr. Tammi Klippel. I will see her back for follow-up visit in 4 months for reevaluation with repeat CT scan of the chest for restaging of her disease. For hypertension, her blood pressure was elevated today secondary to anxiety but usually within the normal range at home. I recommended for the patient to continue with her current blood pressure medication. She was advised to call if she has any concerning symptoms in the interval. The patient voices understanding of current disease status and treatment options and is in agreement with the current care plan.  All questions were answered. The patient knows to call the clinic with any problems, questions or concerns. We can certainly see the patient much sooner if necessary.  Disclaimer: This note was dictated with voice recognition software. Similar sounding words can inadvertently be transcribed and may not be corrected upon review.

## 2016-06-15 NOTE — Telephone Encounter (Signed)
Gave patient avs report and appointments for January. Central radiology will call re scan - patient aware.

## 2016-06-18 ENCOUNTER — Ambulatory Visit
Admission: RE | Admit: 2016-06-18 | Discharge: 2016-06-18 | Disposition: A | Discharge status: Home / Self Care | Payer: Medicare Other | Source: Ambulatory Visit | Attending: Radiation Oncology | Admitting: Radiation Oncology

## 2016-06-18 DIAGNOSIS — C3432 Malignant neoplasm of lower lobe, left bronchus or lung: Secondary | ICD-10-CM | POA: Diagnosis not present

## 2016-06-18 DIAGNOSIS — Z510 Encounter for antineoplastic radiation therapy: Secondary | ICD-10-CM | POA: Diagnosis not present

## 2016-06-18 DIAGNOSIS — C3431 Malignant neoplasm of lower lobe, right bronchus or lung: Secondary | ICD-10-CM | POA: Diagnosis not present

## 2016-06-18 NOTE — Progress Notes (Signed)
  Radiation Oncology         (336) [phone] ________________________________  Name: Phyllis [name] MRN: [id number]  Date: 06/18/2016  DOB: 04-Feb-1956  STEREOTACTIC BODY RADIOTHERAPY SIMULATION AND TREATMENT PLANNING NOTE    ICD-9-CM ICD-10-CM   1. Primary cancer of right lower lobe of lung (HCC) 162.5 C34.31   2. Primary cancer of left lower lobe of lung (HCC) 162.5 C34.32     DIAGNOSIS:  60 year-old woman with bilateral lower lobe clinical stage IA non small cell lung cancers  NARRATIVE:  The patient was brought to the [facility].  Identity was confirmed.  All relevant records and images related to the planned course of therapy were reviewed.  The patient freely provided informed written consent to proceed with treatment after reviewing the details related to the planned course of therapy. The consent form was witnessed and verified by the simulation staff.  Then, the patient was set-up in a stable reproducible  supine position for radiation therapy.  A BodyFix immobilization pillow was fabricated for reproducible positioning.  Then I personally applied the abdominal compression paddle to limit respiratory excursion.  4D respiratoy motion management CT images were obtained.  Surface markings were placed.  The CT images were loaded into the planning software.  Then, using Cine, MIP, and standard views, the internal target volume (ITV) and planning target volumes (PTV) were delinieated, and avoidance structures were contoured.  Treatment planning then occurred.  The radiation prescription was entered and confirmed.  A total of two complex treatment devices were fabricated in the form of the BodyFix immobilization pillow and a neck accuform cushion.  I have requested : 3D Simulation  I have requested a DVH of the following structures: Heart, Lungs, Esophagus, Chest Wall, Brachial Plexus, Major Blood Vessels, and targets.  SPECIAL TREATMENT PROCEDURE:  The planned course of therapy  using radiation constitutes a special treatment procedure. Special care is required in the management of this patient for the following reasons. This treatment constitutes a Special Treatment Procedure for the following reason: [ High dose per fraction requiring special monitoring for increased toxicities of treatment including daily imaging..  The special nature of the planned course of radiotherapy will require increased physician supervision and oversight to ensure patient's safety with optimal treatment outcomes.  RESPIRATORY MOTION MANAGEMENT SIMULATION:  In order to account for effect of respiratory motion on target structures and other organs in the planning and delivery of radiotherapy, this patient underwent respiratory motion management simulation.  To accomplish this, when the patient was brought to the CT simulation planning suite, 4D respiratoy motion management CT images were obtained.  The CT images were loaded into the planning software.  Then, using a variety of tools including Cine, MIP, and standard views, the target volume and planning target volumes (PTV) were delineated.  Avoidance structures were contoured.  Treatment planning then occurred.  Dose volume histograms were generated and reviewed for each of the requested structure.  The resulting plan was carefully reviewed and approved today.  PLAN:  The patient will receive 54 Gy in 3 fraction.  ________________________________  Sheral Apley Tammi Klippel, M.D.  This document serves as a record of services personally performed by [name], MD. It was created on his behalf by Darcus Austin, a trained medical scribe. The creation of this record is based on the scribe's personal observations and the provider's statements to them. This document has been checked and approved by the attending provider.

## 2016-06-21 DIAGNOSIS — C3431 Malignant neoplasm of lower lobe, right bronchus or lung: Secondary | ICD-10-CM | POA: Diagnosis not present

## 2016-06-21 DIAGNOSIS — Z510 Encounter for antineoplastic radiation therapy: Secondary | ICD-10-CM | POA: Diagnosis not present

## 2016-06-21 DIAGNOSIS — C3432 Malignant neoplasm of lower lobe, left bronchus or lung: Secondary | ICD-10-CM | POA: Diagnosis not present

## 2016-06-28 ENCOUNTER — Telehealth: Payer: Self-pay | Admitting: *Deleted

## 2016-06-28 NOTE — Telephone Encounter (Signed)
Pt left msg on triage Friday morning stating MD gave her 3 rx's for Oxycodone, due to the fact that August has 31 days she will be a day short. Wanting to see if MD can ok rx to be refilled on on 9/22 so she want have to go a whole day without any pain med.   Since msg was not taking off triage pt receive regular dated rx on 9/24...Johny Chess

## 2016-07-01 DIAGNOSIS — J449 Chronic obstructive pulmonary disease, unspecified: Secondary | ICD-10-CM | POA: Diagnosis not present

## 2016-07-02 ENCOUNTER — Other Ambulatory Visit: Payer: Self-pay | Admitting: Internal Medicine

## 2016-07-05 ENCOUNTER — Ambulatory Visit
Admission: RE | Admit: 2016-07-05 | Discharge: 2016-07-05 | Disposition: A | Discharge status: Home / Self Care | Payer: Medicare Other | Source: Ambulatory Visit | Attending: Radiation Oncology | Admitting: Radiation Oncology

## 2016-07-05 ENCOUNTER — Ambulatory Visit: Payer: Medicare Other | Admitting: Radiation Oncology

## 2016-07-05 DIAGNOSIS — Z510 Encounter for antineoplastic radiation therapy: Secondary | ICD-10-CM | POA: Diagnosis not present

## 2016-07-05 DIAGNOSIS — C3432 Malignant neoplasm of lower lobe, left bronchus or lung: Secondary | ICD-10-CM | POA: Diagnosis not present

## 2016-07-05 DIAGNOSIS — C3431 Malignant neoplasm of lower lobe, right bronchus or lung: Secondary | ICD-10-CM | POA: Diagnosis not present

## 2016-07-07 ENCOUNTER — Ambulatory Visit
Admission: RE | Admit: 2016-07-07 | Discharge: 2016-07-07 | Disposition: A | Discharge status: Home / Self Care | Payer: Medicare Other | Source: Ambulatory Visit | Attending: Radiation Oncology | Admitting: Radiation Oncology

## 2016-07-07 ENCOUNTER — Ambulatory Visit: Payer: Medicare Other | Admitting: Radiation Oncology

## 2016-07-07 DIAGNOSIS — Z510 Encounter for antineoplastic radiation therapy: Secondary | ICD-10-CM | POA: Diagnosis not present

## 2016-07-07 DIAGNOSIS — C3432 Malignant neoplasm of lower lobe, left bronchus or lung: Secondary | ICD-10-CM | POA: Diagnosis not present

## 2016-07-07 DIAGNOSIS — C3431 Malignant neoplasm of lower lobe, right bronchus or lung: Secondary | ICD-10-CM | POA: Diagnosis not present

## 2016-07-09 ENCOUNTER — Ambulatory Visit: Payer: Medicare Other | Admitting: Radiation Oncology

## 2016-07-12 ENCOUNTER — Encounter: Payer: Self-pay | Admitting: Radiation Oncology

## 2016-07-12 ENCOUNTER — Ambulatory Visit: Admission: RE | Admit: 2016-07-12 | Payer: Medicare Other | Source: Ambulatory Visit | Admitting: Radiation Oncology

## 2016-07-12 ENCOUNTER — Ambulatory Visit
Admission: RE | Admit: 2016-07-12 | Discharge: 2016-07-12 | Disposition: A | Discharge status: Home / Self Care | Payer: Medicare Other | Source: Ambulatory Visit | Attending: Radiation Oncology | Admitting: Radiation Oncology

## 2016-07-12 ENCOUNTER — Ambulatory Visit: Payer: Medicare Other | Admitting: Radiation Oncology

## 2016-07-12 VITALS — BP 147/73 | HR 65 | Temp 97.5°F | Resp 18 | Ht 66.0 in | Wt 135.6 lb

## 2016-07-12 DIAGNOSIS — C3432 Malignant neoplasm of lower lobe, left bronchus or lung: Secondary | ICD-10-CM | POA: Diagnosis not present

## 2016-07-12 DIAGNOSIS — C3431 Malignant neoplasm of lower lobe, right bronchus or lung: Secondary | ICD-10-CM

## 2016-07-12 DIAGNOSIS — Z510 Encounter for antineoplastic radiation therapy: Secondary | ICD-10-CM | POA: Diagnosis not present

## 2016-07-12 NOTE — Progress Notes (Signed)
  Radiation Oncology         [phone]   Name: Phyllis [name] MRN: [id number]   Date: 07/12/2016  DOB: 14-Jul-1956    Weekly Radiation Therapy Management    ICD-9-CM ICD-10-CM   1. Squamous cancer of left lower lobe of lung (HCC) 162.5 C34.32   2. Primary cancer of right lower lobe of lung (HCC) 162.5 C34.31     Current Dose: 54 Gy  Planned Dose:  54 Gy  Narrative The patient presents for routine under treatment assessment.  Vitals and weight stable. Denies any swallowing or throat problems. Skin to chest with normal color. SOB with exertion, wheezing, coughing clear secretions. Appetite is good. Having fatigue in the afternoon.    The patient is without complaint. Set-up films were reviewed. The chart was checked.  Physical Findings  height is _0  (1.676 m) and weight is 135 lb 9.6 oz (61.5 kg). Her oral temperature is 97.5 F (36.4 C). Her blood pressure is 147/73 (abnormal) and her pulse is 65. Her respiration is 18 and oxygen saturation is 100%. . Weight essentially stable.  No significant changes.  Impression The patient tolerated radiation relatively well.  Plan Complete radiation today as scheduled, and follow-up in one month. The patient was encouraged to call or return to the clinic in the interim for any worsening symptoms         Angelino Rumery A. Tammi Klippel, M.D. This document serves as a record of services personally performed by [name], MD. It was created on his behalf by Bethann Humble, a trained medical scribe. The creation of this record is based on the scribe's personal observations and the provider's statements to them. This document has been checked and approved by the attending provider.

## 2016-07-12 NOTE — Progress Notes (Addendum)
Vital signs and weight stable.  Denies any swallowing or throat problems.   Skin to chest with normal color .SOB with exertion,wheezing, coughing clear secretions.  Appetite is good.  Having fatigue in the afternoon.  EOT one month follow up card given.  See doucmentation in education area. Weight changes, if any: Wt Readings from Last 3 Encounters:  07/12/16 135 lb 9.6 oz (61.5 kg)  06/15/16 140 lb 12.8 oz (63.9 kg)  06/10/16 140 lb 4.8 oz (63.6 kg)  BP (!) 147/73 (BP Location: Right Arm, Patient Position: Sitting, Cuff Size: Normal)   Pulse 65   Temp 97.5 F (36.4 C) (Oral)   Resp 18   Ht _0  (1.676 m)   Wt 135 lb 9.6 oz (61.5 kg)   LMP 10/05/1999   SpO2 100%   BMI 21.89 kg/m

## 2016-07-13 ENCOUNTER — Ambulatory Visit: Payer: Medicare Other | Admitting: Radiation Oncology

## 2016-07-14 ENCOUNTER — Ambulatory Visit: Payer: Medicare Other | Admitting: Radiation Oncology

## 2016-07-15 ENCOUNTER — Ambulatory Visit: Payer: Medicare Other | Admitting: Radiation Oncology

## 2016-07-18 NOTE — Progress Notes (Signed)
  Radiation Oncology         (336) [phone] ________________________________  Name: Phyllis [name] MRN: [id number]  Date: 07/12/2016  DOB: 1956-03-24  End of Treatment Note  Diagnosis:   60 year-old woman with bilateral lower lobe clinical stage IA non small cell lung cancers     Indication for treatment:  Curative, Definitive SBRT       Radiation treatment dates:   Curative  Site/dose:   The target was treated to 54 Gy in 3 fractions of 18 Gy to both right and left sided tumors  Beams/energy:   The patient was treated using stereotactic body radiotherapy according to a 3D conformal radiotherapy plan.  Volumetric arc fields were employed to deliver 6 MV X-rays.  Image guidance was performed with per fraction cone beam CT prior to treatment under personal MD supervision.  Immobilization was achieved using BodyFix Pillow.  Narrative: The patient tolerated radiation treatment relatively well.   No complications occurred.  Plan: The patient has completed radiation treatment. The patient will return to radiation oncology clinic for routine followup in one month. I advised them to call or return sooner if they have any questions or concerns related to their recovery or treatment. ________________________________  Sheral Apley. Tammi Klippel, M.D.

## 2016-07-31 DIAGNOSIS — J449 Chronic obstructive pulmonary disease, unspecified: Secondary | ICD-10-CM | POA: Diagnosis not present
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
## 2016-08-16 ENCOUNTER — Other Ambulatory Visit: Payer: Self-pay | Admitting: Internal Medicine

## 2016-08-16 DIAGNOSIS — I2510 Atherosclerotic heart disease of native coronary artery without angina pectoris: Secondary | ICD-10-CM

## 2016-08-16 DIAGNOSIS — I10 Essential (primary) hypertension: Secondary | ICD-10-CM

## 2016-08-17 ENCOUNTER — Ambulatory Visit
Admission: RE | Admit: 2016-08-17 | Discharge: 2016-08-17 | Disposition: A | Discharge status: Home / Self Care | Payer: Medicare Other | Source: Ambulatory Visit | Attending: Radiation Oncology | Admitting: Radiation Oncology

## 2016-08-17 ENCOUNTER — Encounter: Payer: Self-pay | Admitting: Radiation Oncology

## 2016-08-17 VITALS — BP 121/66 | HR 95 | Temp 97.8°F | Resp 16 | Ht 66.0 in | Wt 142.6 lb

## 2016-08-17 DIAGNOSIS — C3432 Malignant neoplasm of lower lobe, left bronchus or lung: Secondary | ICD-10-CM

## 2016-08-17 DIAGNOSIS — Z888 Allergy status to other drugs, medicaments and biological substances status: Secondary | ICD-10-CM | POA: Insufficient documentation

## 2016-08-17 DIAGNOSIS — J309 Allergic rhinitis, unspecified: Secondary | ICD-10-CM | POA: Insufficient documentation

## 2016-08-17 DIAGNOSIS — Z7982 Long term (current) use of aspirin: Secondary | ICD-10-CM | POA: Diagnosis not present

## 2016-08-17 DIAGNOSIS — Z923 Personal history of irradiation: Secondary | ICD-10-CM | POA: Insufficient documentation

## 2016-08-17 DIAGNOSIS — Z9981 Dependence on supplemental oxygen: Secondary | ICD-10-CM | POA: Diagnosis not present

## 2016-08-17 DIAGNOSIS — C3431 Malignant neoplasm of lower lobe, right bronchus or lung: Secondary | ICD-10-CM | POA: Insufficient documentation

## 2016-08-17 DIAGNOSIS — J449 Chronic obstructive pulmonary disease, unspecified: Secondary | ICD-10-CM | POA: Insufficient documentation

## 2016-08-17 NOTE — Progress Notes (Signed)
Radiation Oncology         [phone] ________________________________  Name: Phyllis [name] MRN: [id number]  Date: 08/17/2016  DOB: April 16, 1956  Post Treatment Note  CC: [name], MD  Chesley Mires, MD  Diagnosis:   60 year-old woman with bilateral lower lobe clinical stage IA non small cell lung cancers.  Interval Since Last Radiation:  5 weeks   07/05/16-07/12/16: The target was treated to 54 Gy in 3 fractions of 18 Gy to both right and left sided tumors  Narrative:  The patient returns today for routine follow-up. She tolerated her radiotherapy well without incident. She reports she has noticed increasing allergic rhinitis symptoms of post nasal drip and occasional dry nares due to her oxygen. She reports that she has to increase her O2 when pollen levels are high. She feels that this is starting to improve in the last few days. She denies any fevers, shortness of breath at rest, or productive purulent mucous, she does have coughing spells infrequently and reports she expels clear sputum. She is trying to continue to eat more regularly. No other complaints are noted.  ALLERGIES:  is allergic to amlodipine; meperidine hcl; and naproxen.  Meds: Current Outpatient Prescriptions  Medication Sig Dispense Refill  . Albuterol Sulfate (PROAIR RESPICLICK) 664 (90 BASE) MCG/ACT AEPB Inhale 1 puff into the lungs 4 (four) times daily as needed (For shortness of breath.). 1 each 0  . ALPRAZolam (XANAX) 0.5 MG tablet Take 1 tablet (0.5 mg total) by mouth 3 (three) times daily as needed. (Patient taking differently: Take 0.5 mg by mouth 3 (three) times daily as needed for anxiety. ) 90 tablet 2  . aspirin EC 81 MG tablet Take 81 mg by mouth at bedtime.     [name] atorvastatin (LIPITOR) 40 MG tablet TAKE 1 TABLET(40 MG) BY MOUTH DAILY 6 PM 30 tablet 11  . budesonide (PULMICORT) 0.5 MG/2ML nebulizer solution Take 2 mLs (0.5 mg total) by nebulization 2 (two) times daily. 60 mL 12  . carvedilol  (COREG) 6.25 MG tablet TAKE 1 TABLET(6.25 MG) BY MOUTH TWICE DAILY WITH A MEAL 60 tablet 5  . gabapentin (NEURONTIN) 300 MG capsule TAKE 1 CAPSULE BY MOUTH TWICE DAILY 60 capsule 11  . ipratropium-albuterol (DUONEB) 0.5-2.5 (3) MG/3ML SOLN Take 3 mLs by nebulization 3 (three) times daily. 360 mL 3  . potassium chloride SA (KLOR-CON M20) 20 MEQ tablet Take 1 tablet (20 mEq total) by mouth 2 (two) times daily. 60 tablet 2  . sodium chloride (OCEAN) 0.65 % SOLN nasal spray Place 1 spray into both nostrils as needed for congestion. 480 mL 0  . triamcinolone ointment (KENALOG) 0.1 % Apply 1 application topically 2 (two) times daily. 30 g 0  . triamterene-hydrochlorothiazide (MAXZIDE-25) 37.5-25 MG tablet Take 1 tablet by mouth daily. 90 tablet 1  . oxyCODONE (ROXICODONE) 15 MG immediate release tablet Take 1 tablet (15 mg total) by mouth every 6 (six) hours as needed. For pain. 120 tablet 0   No current facility-administered medications for this encounter.     Physical Findings:  height is 5' 6" (1.676 m) and weight is 142 lb 9.6 oz (64.7 kg). Her oral temperature is 97.8 F (36.6 C). Her blood pressure is 121/66 and her pulse is 95. Her respiration is 16.  In general this is a well appearing african [place] female in no acute distress. She's alert and oriented x4 and appropriate throughout the examination. Cardiopulmonary assessment is negative for acute distress and she  exhibits normal effort.   Lab Findings: Lab Results  Component Value Date   WBC 6.1 06/15/2016   HGB 13.1 06/15/2016   HCT 40.2 06/15/2016   MCV 96.5 06/15/2016   PLT 214 06/15/2016     Radiographic Findings: No results found.  Impression/Plan: 73. 60 year-old woman with bilateral lower lobe clinical stage IA non small cell lung cancers. The patient appears to be doing well since her radiotherapy without concern for pneumonitis. I have given her precautions regarding this. I have also encouraged her to keep [place] informed of  any questions regarding radiotherapy. As she originally has met with Dr. Julien Nordmann in the thoracic oncology clinic, she will resume surveillance under his care. I will order her first post treatment CT to establish a baseline, and follow up with her by phone with these results. She will plan to see Dr. Julien Nordmann in January 2018. 2. COPD and allergic rhinitis. The patient will continue to follow up with Dr. Halford Chessman, and I have encouraged her to keep him informed of any clinical changes she notes prior to her upcoming appointment with him.     Carola Rhine, PAC

## 2016-08-18 ENCOUNTER — Encounter: Payer: Self-pay | Admitting: Internal Medicine

## 2016-08-18 ENCOUNTER — Ambulatory Visit (INDEPENDENT_AMBULATORY_CARE_PROVIDER_SITE_OTHER): Payer: Medicare Other | Admitting: Internal Medicine

## 2016-08-18 VITALS — BP 158/90 | HR 76 | Temp 97.9°F | Resp 16 | Ht 66.0 in | Wt 141.2 lb

## 2016-08-18 DIAGNOSIS — I10 Essential (primary) hypertension: Secondary | ICD-10-CM

## 2016-08-18 DIAGNOSIS — Z79891 Long term (current) use of opiate analgesic: Secondary | ICD-10-CM | POA: Diagnosis not present

## 2016-08-18 DIAGNOSIS — M5416 Radiculopathy, lumbar region: Secondary | ICD-10-CM

## 2016-08-18 DIAGNOSIS — E038 Other specified hypothyroidism: Secondary | ICD-10-CM

## 2016-08-18 DIAGNOSIS — E118 Type 2 diabetes mellitus with unspecified complications: Secondary | ICD-10-CM

## 2016-08-18 DIAGNOSIS — Z79899 Other long term (current) drug therapy: Secondary | ICD-10-CM | POA: Diagnosis not present

## 2016-08-18 MED ORDER — OXYCODONE HCL 15 MG PO TABS: 15.0000 mg | ORAL_TABLET | Freq: Four times a day (QID) | ORAL | 0 refills | Status: DC | PRN

## 2016-08-18 NOTE — Progress Notes (Signed)
Pre visit review using our clinic review tool, if applicable. No additional management support is needed unless otherwise documented below in the visit note.

## 2016-08-18 NOTE — Progress Notes (Signed)
Subjective:  Patient ID: Phyllis [name], female    DOB: 10/18/1955  Age: 60 y.o. MRN: [id number]  CC: Back Pain and Hypertension   HPI Phyllis [name] presents for follow-up on low back pain and to request a refill on oxycodone for pain management. She complains of persistent episodes of lower back pain that radiates into both legs, more on the right than the left. Her symptoms have not recently worsened. She denies paresthesias in her lower extremities. In general, she is not feeling well. She complains of persistent nonproductive cough and shortness of breath. She is oxygen dependent. She has fatigue but denies night sweats, fever, chills, abdominal pain, nausea, vomiting, diarrhea, or constipation.  Outpatient Medications Prior to Visit  Medication Sig Dispense Refill  . Albuterol Sulfate (PROAIR RESPICLICK) 270 (90 BASE) MCG/ACT AEPB Inhale 1 puff into the lungs 4 (four) times daily as needed (For shortness of breath.). 1 each 0  . ALPRAZolam (XANAX) 0.5 MG tablet Take 1 tablet (0.5 mg total) by mouth 3 (three) times daily as needed. (Patient taking differently: Take 0.5 mg by mouth 3 (three) times daily as needed for anxiety. ) 90 tablet 2  . aspirin EC 81 MG tablet Take 81 mg by mouth at bedtime.     [name] atorvastatin (LIPITOR) 40 MG tablet TAKE 1 TABLET(40 MG) BY MOUTH DAILY 6 PM 30 tablet 11  . budesonide (PULMICORT) 0.5 MG/2ML nebulizer solution Take 2 mLs (0.5 mg total) by nebulization 2 (two) times daily. 60 mL 12  . carvedilol (COREG) 6.25 MG tablet TAKE 1 TABLET(6.25 MG) BY MOUTH TWICE DAILY WITH A MEAL 60 tablet 5  . gabapentin (NEURONTIN) 300 MG capsule TAKE 1 CAPSULE BY MOUTH TWICE DAILY 60 capsule 11  . ipratropium-albuterol (DUONEB) 0.5-2.5 (3) MG/3ML SOLN Take 3 mLs by nebulization 3 (three) times daily. 360 mL 3  . potassium chloride SA (KLOR-CON M20) 20 MEQ tablet Take 1 tablet (20 mEq total) by mouth 2 (two) times daily. 60 tablet 2  . sodium chloride (OCEAN) 0.65 %  SOLN nasal spray Place 1 spray into both nostrils as needed for congestion. 480 mL 0  . triamcinolone ointment (KENALOG) 0.1 % Apply 1 application topically 2 (two) times daily. 30 g 0  . triamterene-hydrochlorothiazide (MAXZIDE-25) 37.5-25 MG tablet Take 1 tablet by mouth daily. 90 tablet 1  . oxyCODONE (ROXICODONE) 15 MG immediate release tablet Take 1 tablet (15 mg total) by mouth every 6 (six) hours as needed. For pain. 120 tablet 0   No facility-administered medications prior to visit.     ROS Review of Systems  Constitutional: Positive for fatigue. Negative for appetite change, chills, diaphoresis and fever.  HENT: Negative.  Negative for sinus pressure and trouble swallowing.   Eyes: Negative.  Negative for visual disturbance.  Respiratory: Positive for cough and shortness of breath. Negative for choking, chest tightness, wheezing and stridor.   Cardiovascular: Negative for chest pain, palpitations and leg swelling.  Gastrointestinal: Negative.  Negative for abdominal pain, constipation, diarrhea, nausea and vomiting.  Endocrine: Negative.  Negative for cold intolerance and heat intolerance.  Genitourinary: Negative.   Musculoskeletal: Positive for back pain. Negative for arthralgias, joint swelling and neck pain.  Allergic/Immunologic: Negative.   Neurological: Negative.  Negative for dizziness, tremors, weakness, light-headedness, numbness and headaches.  Hematological: Negative.  Negative for adenopathy. Does not bruise/bleed easily.  Psychiatric/Behavioral: Negative.     Objective:  BP (!) 158/90 (BP Location: Left Arm, Patient Position: Sitting, Cuff Size: Normal)  Pulse 76   Temp 97.9 F (36.6 C) (Oral)   Resp 16   Ht _0  (1.676 m)   Wt 141 lb 4 oz (64.1 kg)   LMP 10/05/1999   SpO2 92%   BMI 22.80 kg/m   BP Readings from Last 3 Encounters:  08/18/16 (!) 158/90  08/17/16 121/66  07/12/16 (!) 147/73    Wt Readings from Last 3 Encounters:  08/18/16 141 lb 4  oz (64.1 kg)  08/17/16 142 lb 9.6 oz (64.7 kg)  07/12/16 135 lb 9.6 oz (61.5 kg)    Physical Exam  Constitutional: She is oriented to person, place, and time. No distress.  HENT:  Mouth/Throat: Oropharynx is clear and moist. No oropharyngeal exudate.  Eyes: Conjunctivae are normal. Right eye exhibits no discharge. Left eye exhibits no discharge. No scleral icterus.  Neck: Normal range of motion. Neck supple. No JVD present. No tracheal deviation present. No thyromegaly present.  Cardiovascular: Normal rate, regular rhythm, normal heart sounds and intact distal pulses.  Exam reveals no gallop and no friction rub.   No murmur heard. Pulmonary/Chest: Effort normal. No stridor. No respiratory distress. She has no wheezes. She has no rales. She exhibits no tenderness.  Abdominal: Soft. Bowel sounds are normal. She exhibits no distension and no mass. There is no tenderness. There is no rebound and no guarding.  Musculoskeletal: Normal range of motion. She exhibits no edema, tenderness or deformity.  Lymphadenopathy:    She has no cervical adenopathy.  Neurological: She is oriented to person, place, and time.  NEG SLR in BLE  Skin: Skin is warm and dry. No rash noted. She is not diaphoretic. No erythema. No pallor.  Psychiatric: She has a normal mood and affect. Her behavior is normal. Judgment and thought content normal.  Vitals reviewed.   Lab Results  Component Value Date   WBC 6.1 06/15/2016   HGB 13.1 06/15/2016   HCT 40.2 06/15/2016   PLT 214 06/15/2016   GLUCOSE 151 (H) 06/15/2016   CHOL 93 02/26/2016   TRIG 52.0 02/26/2016   HDL 45.30 02/26/2016   LDLCALC 37 02/26/2016   ALT 11 06/15/2016   AST 16 06/15/2016   NA 131 (L) 06/15/2016   K 4.0 06/15/2016   CL 91 (L) 05/17/2016   CREATININE 0.9 06/15/2016   BUN 5.4 (L) 06/15/2016   CO2 30 (H) 06/15/2016   TSH 1.28 05/17/2016   INR 1.07 05/27/2016   HGBA1C 6.7 (H) 05/17/2016   MICROALBUR 1.0 12/02/2015    No results  found.  Assessment & Plan:   Chanin was seen today for back pain and hypertension.  Diagnoses and all orders for this visit:  Right lumbar radiculitis -     Discontinue: oxyCODONE (ROXICODONE) 15 MG immediate release tablet; Take 1 tablet (15 mg total) by mouth every 6 (six) hours as needed. For pain. -     Discontinue: oxyCODONE (ROXICODONE) 15 MG immediate release tablet; Take 1 tablet (15 mg total) by mouth every 6 (six) hours as needed. For pain. -     Discontinue: oxyCODONE (ROXICODONE) 15 MG immediate release tablet; Take 1 tablet (15 mg total) by mouth every 6 (six) hours as needed. For pain. -     oxyCODONE (ROXICODONE) 15 MG immediate release tablet; Take 1 tablet (15 mg total) by mouth every 6 (six) hours as needed. For pain.  Essential hypertension, benign - her blood pressure is adequately well-controlled  Other specified hypothyroidism- TSH has recently been in the normal  range and she is asymptomatic with respect to this. We'll continue the current dose of levothyroxine.  Type 2 diabetes mellitus with complication, without long-term current use of insulin ([place])- recent A1c was 6.7%, her blood sugars are adequately well-controlled.   I am having Ms. Emond maintain her aspirin EC, Albuterol Sulfate, triamcinolone ointment, sodium chloride, ipratropium-albuterol, budesonide, potassium chloride SA, atorvastatin, triamterene-hydrochlorothiazide, ALPRAZolam, gabapentin, carvedilol, and oxyCODONE.  Meds ordered this encounter  Medications  . DISCONTD: oxyCODONE (ROXICODONE) 15 MG immediate release tablet    Sig: Take 1 tablet (15 mg total) by mouth every 6 (six) hours as needed. For pain.    Dispense:  120 tablet    Refill:  0    Fill on or after 08/18/16  . DISCONTD: oxyCODONE (ROXICODONE) 15 MG immediate release tablet    Sig: Take 1 tablet (15 mg total) by mouth every 6 (six) hours as needed. For pain.    Dispense:  120 tablet    Refill:  0    Fill on or after 09/17/16    . DISCONTD: oxyCODONE (ROXICODONE) 15 MG immediate release tablet    Sig: Take 1 tablet (15 mg total) by mouth every 6 (six) hours as needed. For pain.    Dispense:  120 tablet    Refill:  0    Fill on or after 10/18/16  . oxyCODONE (ROXICODONE) 15 MG immediate release tablet    Sig: Take 1 tablet (15 mg total) by mouth every 6 (six) hours as needed. For pain.    Dispense:  120 tablet    Refill:  0    Fill on or after 11/18/16     Follow-up: Return in about 4 months (around 12/16/2016).  [name], MD

## 2016-08-18 NOTE — Patient Instructions (Signed)

## 2016-08-19 ENCOUNTER — Other Ambulatory Visit: Payer: Self-pay | Admitting: Radiation Oncology

## 2016-08-19 DIAGNOSIS — C3432 Malignant neoplasm of lower lobe, left bronchus or lung: Secondary | ICD-10-CM

## 2016-08-19 DIAGNOSIS — C3431 Malignant neoplasm of lower lobe, right bronchus or lung: Secondary | ICD-10-CM

## 2016-08-24 ENCOUNTER — Telehealth: Payer: Self-pay | Admitting: *Deleted

## 2016-08-24 NOTE — Telephone Encounter (Signed)
Called patient to inform of Ct on 08/31/16 @ [facility], patient to be NPO 4 hrs. prior to test, spoke with patient and she is aware of this test

## 2016-08-27 ENCOUNTER — Other Ambulatory Visit: Payer: Self-pay | Admitting: Pulmonary Disease

## 2016-08-28 ENCOUNTER — Telehealth: Payer: Self-pay | Admitting: Internal Medicine

## 2016-08-28 MED ORDER — IPRATROPIUM-ALBUTEROL 0.5-2.5 (3) MG/3ML IN SOLN: 3.0000 mL | Freq: Three times a day (TID) | RESPIRATORY_TRACT | 12 refills | Status: DC

## 2016-08-28 NOTE — Telephone Encounter (Signed)
On call- refill DuoNeb neb solution called in.

## 2016-08-30 ENCOUNTER — Other Ambulatory Visit: Payer: Self-pay | Admitting: Radiation Oncology

## 2016-08-30 ENCOUNTER — Telehealth: Payer: Self-pay | Admitting: *Deleted

## 2016-08-30 DIAGNOSIS — C3432 Malignant neoplasm of lower lobe, left bronchus or lung: Secondary | ICD-10-CM

## 2016-08-30 NOTE — Telephone Encounter (Signed)
CALLED PATIENT TO INFORM OF STAT LABS FOR 08-31-16 @ 11:15 AM, SPOKE WITH PATIENT  AND SHE IS AWARE OF THIS LAB APPT.

## 2016-08-31 ENCOUNTER — Other Ambulatory Visit: Payer: Self-pay | Admitting: Radiation Oncology

## 2016-08-31 ENCOUNTER — Ambulatory Visit
Admission: RE | Admit: 2016-08-31 | Discharge: 2016-08-31 | Disposition: A | Discharge status: Home / Self Care | Payer: Medicare Other | Source: Ambulatory Visit | Attending: Radiation Oncology | Admitting: Radiation Oncology

## 2016-08-31 ENCOUNTER — Ambulatory Visit (HOSPITAL_COMMUNITY)
Admission: RE | Admit: 2016-08-31 | Discharge: 2016-08-31 | Disposition: A | Discharge status: Home / Self Care | Payer: Medicare Other | Source: Ambulatory Visit | Attending: Radiation Oncology | Admitting: Radiation Oncology

## 2016-08-31 DIAGNOSIS — C3432 Malignant neoplasm of lower lobe, left bronchus or lung: Secondary | ICD-10-CM | POA: Insufficient documentation

## 2016-08-31 DIAGNOSIS — R918 Other nonspecific abnormal finding of lung field: Secondary | ICD-10-CM | POA: Diagnosis not present

## 2016-08-31 DIAGNOSIS — C3490 Malignant neoplasm of unspecified part of unspecified bronchus or lung: Secondary | ICD-10-CM | POA: Diagnosis not present

## 2016-08-31 DIAGNOSIS — C3431 Malignant neoplasm of lower lobe, right bronchus or lung: Secondary | ICD-10-CM | POA: Insufficient documentation

## 2016-08-31 DIAGNOSIS — T17890A Other foreign object in other parts of respiratory tract causing asphyxiation, initial encounter: Secondary | ICD-10-CM | POA: Diagnosis not present

## 2016-08-31 DIAGNOSIS — R932 Abnormal findings on diagnostic imaging of liver and biliary tract: Secondary | ICD-10-CM | POA: Diagnosis not present

## 2016-08-31 DIAGNOSIS — X58XXXA Exposure to other specified factors, initial encounter: Secondary | ICD-10-CM | POA: Insufficient documentation

## 2016-08-31 DIAGNOSIS — K8689 Other specified diseases of pancreas: Secondary | ICD-10-CM | POA: Insufficient documentation

## 2016-08-31 DIAGNOSIS — J449 Chronic obstructive pulmonary disease, unspecified: Secondary | ICD-10-CM | POA: Diagnosis not present

## 2016-08-31 LAB — BUN AND CREATININE (CC13)
BUN: 4.1 mg/dL — AB (ref 7.0–26.0)
Creatinine: 0.8 mg/dL (ref 0.6–1.1)
EGFR: 88 mL/min/{1.73_m2} — AB (ref >90)

## 2016-08-31 MED ORDER — SODIUM CHLORIDE 0.9 % IJ SOLN
INTRAMUSCULAR | Status: AC
  Filled 2016-08-31: qty 50

## 2016-08-31 MED ORDER — IOPAMIDOL (ISOVUE-300) INJECTION 61%: 75.0000 mL | Freq: Once | INTRAVENOUS | Status: DC | PRN

## 2016-08-31 MED ORDER — IOPAMIDOL (ISOVUE-300) INJECTION 61%
INTRAVENOUS | Status: AC
  Administered 2016-08-31: 75 mL
  Filled 2016-08-31: qty 75

## 2016-09-01 ENCOUNTER — Telehealth: Payer: Self-pay | Admitting: *Deleted

## 2016-09-01 NOTE — Telephone Encounter (Signed)
Received message from [place] @ [facility] wanting to call report of pt's CT scan done yesterday.   Message left on voice mail for [place], [place] in radiology.  Results printed and left on Dr. Worthy Flank desk for review. Spoke with Opal Sidles and informed her that this triage nurse received her message.

## 2016-09-03 ENCOUNTER — Telehealth: Payer: Self-pay | Admitting: Radiation Oncology

## 2016-09-03 NOTE — Telephone Encounter (Addendum)
I called and spoke with the patient regarding her CT findings, and that we would discuss her case at thoracic conference next week, and in the meantime also look at a time for her to meet with Dr. Julien Nordmann to consider discussion of chemotherapy options. We also discussed the option for radiation in a different style than what she's received previously.

## 2016-09-06 ENCOUNTER — Telehealth: Payer: Self-pay | Admitting: *Deleted

## 2016-09-06 NOTE — Telephone Encounter (Signed)
Oncology Nurse Navigator Documentation  Oncology Nurse Navigator Flowsheets 09/06/2016  Navigator Location [facility]  Navigator Encounter Type Telephone/I followed up with Dr. Julien Nordmann regarding setting her up to see him sooner than 1/16.  He stated ok to schedule on 12/8.  I called patient but was unable to reach.  I left my name and phone number to call.   Telephone Outgoing Call  Treatment Phase Pre-Tx/Tx Discussion  Barriers/Navigation Needs Coordination of Care  Interventions Coordination of Care  Coordination of Care Appts  Acuity Level 1  Time Spent with Patient 15

## 2016-09-07 ENCOUNTER — Ambulatory Visit: Payer: Self-pay | Admitting: Pulmonary Disease

## 2016-09-07 ENCOUNTER — Telehealth: Payer: Self-pay | Admitting: *Deleted

## 2016-09-07 NOTE — Telephone Encounter (Signed)
Oncology Nurse Navigator Documentation  Oncology Nurse Navigator Flowsheets 09/07/2016  Navigator Location [facility]  Navigator Encounter Type Telephone/I called patient to follow up and update on new appt.  I gave her the appt.  I gave her an appt to see Dr. Julien Nordmann 09/10/16.  Patient did not want to be seen until after the holidays.  She would like to wait until the 10/19/16 appt.  I listened as she explained.  I updated on the need for earlier appt but she still did not want to come.  I will update Dr. Julien Nordmann and Shona Simpson PA-C  Telephone Outgoing Call  Treatment Phase Pre-Tx/Tx Discussion  Barriers/Navigation Needs Coordination of Care  Interventions Coordination of Care  Coordination of Care Appts  Acuity Level 2  Acuity Level 2 Other  Time Spent with Patient 30

## 2016-09-13 ENCOUNTER — Other Ambulatory Visit: Payer: Self-pay | Admitting: Internal Medicine

## 2016-09-13 DIAGNOSIS — F418 Other specified anxiety disorders: Secondary | ICD-10-CM

## 2016-09-13 NOTE — Telephone Encounter (Signed)
Faxing rx to [facility].

## 2016-09-17 ENCOUNTER — Other Ambulatory Visit: Payer: Self-pay

## 2016-09-22 ENCOUNTER — Ambulatory Visit: Payer: Self-pay | Admitting: Internal Medicine

## 2016-09-30 DIAGNOSIS — J449 Chronic obstructive pulmonary disease, unspecified: Secondary | ICD-10-CM | POA: Diagnosis not present

## 2016-10-12 ENCOUNTER — Emergency Department (HOSPITAL_COMMUNITY)
Admission: EM | Admit: 2016-10-12 | Discharge: 2016-10-12 | Discharge status: Against Medical Advice | Payer: Medicare Other | Attending: Emergency Medicine | Admitting: Emergency Medicine

## 2016-10-12 ENCOUNTER — Encounter (HOSPITAL_COMMUNITY): Payer: Self-pay | Admitting: *Deleted

## 2016-10-12 DIAGNOSIS — E039 Hypothyroidism, unspecified: Secondary | ICD-10-CM | POA: Diagnosis not present

## 2016-10-12 DIAGNOSIS — I2510 Atherosclerotic heart disease of native coronary artery without angina pectoris: Secondary | ICD-10-CM | POA: Insufficient documentation

## 2016-10-12 DIAGNOSIS — I10 Essential (primary) hypertension: Secondary | ICD-10-CM | POA: Insufficient documentation

## 2016-10-12 DIAGNOSIS — E119 Type 2 diabetes mellitus without complications: Secondary | ICD-10-CM | POA: Insufficient documentation

## 2016-10-12 DIAGNOSIS — R55 Syncope and collapse: Secondary | ICD-10-CM | POA: Diagnosis not present

## 2016-10-12 DIAGNOSIS — J449 Chronic obstructive pulmonary disease, unspecified: Secondary | ICD-10-CM | POA: Diagnosis not present

## 2016-10-12 DIAGNOSIS — R404 Transient alteration of awareness: Secondary | ICD-10-CM | POA: Diagnosis not present

## 2016-10-12 DIAGNOSIS — Z87891 Personal history of nicotine dependence: Secondary | ICD-10-CM | POA: Diagnosis not present

## 2016-10-12 DIAGNOSIS — Z7982 Long term (current) use of aspirin: Secondary | ICD-10-CM | POA: Diagnosis not present

## 2016-10-12 NOTE — ED Notes (Signed)
Pt has stated from the beginning that she does not feel like she needs to be here. She stated that her husband called EMS and made her come to the hospital.  Pt states she feels fine.  Pt is leaving against medical advice per Dr. Alvino Chapel.  Pt pulled out her own IV and took herself off her oxygen and ambulated out of department without incident.  Husband was with pt.

## 2016-10-12 NOTE — ED Provider Notes (Signed)
[facility] DEPT Provider Note   CSN: [id number] Arrival date & time: 10/12/16  1313     History   Chief Complaint Chief Complaint  Patient presents with  . Fall  . Loss of Consciousness    HPI Phyllis [name] is a 61 y.o. female.  HPI Patient presents after syncopal episode. Patient reportedly was walking in the hall and then passed out. Witnessed by her husband. Came to after husband called 911. Patient states she passed out because she hurts. She has been off her chronic pain medicines for the last week or 2. On the 20th of last month she had a 30 day supply and it has been gone for a week or two. Patient states that she needs more pain medicines that she is hurting in her back. Denies chest pain. States she took a Xanax that she had because her husband was bothering her. Patient is on chronic oxygen. Past Medical History:  Diagnosis Date  . ASTHMA 06/06/2009  . BACK PAIN 08/06/2010  . Chronic pancreatitis ([place])   . COPD with emphysema ([place])   . DEPRESSION 06/06/2009   states she is not depressed  . Diabetes mellitus ([place])   . Eczema   . Edema 05/20/2010  . GERD 06/06/2009  . HEPATITIS C WITHOUT HEPATIC COMA 06/06/2009  . HYPERTENSION 06/06/2009  . OSTEOPOROSIS 06/06/2009  . 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 Squamous cell carcinoma 06/15/2016  . TOBACCO USE 06/05/2010  . Unspecified hypothyroidism 06/05/2010  . VITAMIN D DEFICIENCY 06/06/2009    Patient Active Problem List   Diagnosis Date Noted  . Squamous cancer of left lower lobe of lung ([place]) 06/15/2016  . Primary cancer of right lower lobe of lung ([place]) 06/08/2016  . Type II diabetes mellitus with manifestations ([place]) 04/08/2015  . CHF NYHA class I ([place]) 04/08/2015  . Pulmonary hypertension due to COPD ([place])   . COPD bronchitis 09/30/2014  . Visit for screening mammogram 09/30/2014  . Routine general medical examination at a health care facility 09/30/2014  . CAD (coronary artery disease),  native coronary artery 03/05/2013  . Right lumbar radiculitis 08/06/2010  . Hypothyroidism 06/05/2010  . TOBACCO USE 06/05/2010  . Hep C w/o coma, chronic ([place]) 06/06/2009  . Depression with anxiety 06/06/2009  . Essential hypertension, benign 06/06/2009  . GERD 06/06/2009    Past Surgical History:  Procedure Laterality Date  . BREAST LUMPECTOMY     benign, right  . CHOLECYSTECTOMY    . LEFT HEART CATHETERIZATION WITH CORONARY ANGIOGRAM N/A 03/08/2013   Procedure: LEFT HEART CATHETERIZATION WITH CORONARY ANGIOGRAM;  Surgeon: Clent Demark, MD;  Location: [facility] CATH LAB;  Service: Cardiovascular;  Laterality: N/A;  . LUMBAR LAMINECTOMY    . TONSILLECTOMY    . TUBAL LIGATION      OB History    No data available       Home Medications    Prior to Admission medications   Medication Sig Start Date End Date Taking? Authorizing Provider  Albuterol Sulfate (PROAIR RESPICLICK) 063 (90 BASE) MCG/ACT AEPB Inhale 1 puff into the lungs 4 (four) times daily as needed (For shortness of breath.). 04/01/15  Yes Shanker Kristeen Mans, MD  ALPRAZolam (XANAX) 0.5 MG tablet TAKE 1 TABLET BY MOUTH THREE TIMES DAILY AS NEEDED Patient taking differently: TAKE 0.5 MG BY MOUTH THREE TIMES DAILY AS NEEDED FOR ANXIETY 09/13/16  Yes [name], MD  aspirin EC 81 MG tablet Take 81 mg by mouth at bedtime.  Yes Historical Provider, MD  atorvastatin (LIPITOR) 40 MG tablet TAKE 1 TABLET(40 MG) BY MOUTH DAILY 6 PM 05/03/16  Yes [name], MD  budesonide (PULMICORT) 0.5 MG/2ML nebulizer solution Take 2 mLs (0.5 mg total) by nebulization 2 (two) times daily. 01/27/16  Yes Chesley Mires, MD  carvedilol (COREG) 6.25 MG tablet TAKE 1 TABLET(6.25 MG) BY MOUTH TWICE DAILY WITH A MEAL 08/16/16  Yes [name], MD  gabapentin (NEURONTIN) 300 MG capsule TAKE 1 CAPSULE BY MOUTH TWICE DAILY Patient taking differently: TAKE 300 MG BY MOUTH TWICE DAILY 07/02/16  Yes [name], MD  ipratropium-albuterol (DUONEB)  0.5-2.5 (3) MG/3ML SOLN Take 3 mLs by nebulization 3 (three) times daily. 08/28/16  Yes Deneise Lever, MD  oxyCODONE (ROXICODONE) 15 MG immediate release tablet Take 1 tablet (15 mg total) by mouth every 6 (six) hours as needed. For pain. 08/18/16  Yes [name], MD  potassium chloride SA (KLOR-CON M20) 20 MEQ tablet Take 1 tablet (20 mEq total) by mouth 2 (two) times daily. 02/09/16  Yes [name], MD  triamterene-hydrochlorothiazide (MAXZIDE-25) 37.5-25 MG tablet Take 1 tablet by mouth daily. 05/04/16  Yes [name], MD  sodium chloride (OCEAN) 0.65 % SOLN nasal spray Place 1 spray into both nostrils as needed for congestion. Patient not taking: Reported on 10/12/2016 04/01/15   Jonetta Osgood, MD  triamcinolone ointment (KENALOG) 0.1 % Apply 1 application topically 2 (two) times daily. Patient not taking: Reported on 10/12/2016 04/01/15   Jonetta Osgood, MD    Family History Family History  Problem Relation Age of Onset  . COPD Father   . Hypertension Mother   . Kidney disease Mother   . Colon cancer Neg Hx   . Stomach cancer Neg Hx     Social History Social History  Substance Use Topics  . Smoking status: Former Smoker    Packs/day: 0.10    Years: 35.00    Types: Cigarettes    Quit date: 04/03/2015  . Smokeless tobacco: Never Used     Comment: Trying to quit;   . Alcohol use No     Allergies   Amlodipine; Meperidine hcl; and Naproxen   Review of Systems Review of Systems  Constitutional: Negative for fever.  HENT: Negative for congestion.   Cardiovascular: Negative for chest pain.  Gastrointestinal: Negative for abdominal pain.  Musculoskeletal: Positive for back pain.  Neurological: Positive for syncope.     Physical Exam Updated Vital Signs BP 104/67 (BP Location: Left Arm)   Pulse 75   Temp 97.5 F (36.4 C) (Oral)   Resp 18   LMP 10/05/1999   SpO2 100%   Physical Exam  Constitutional: She appears well-developed.  HENT:  Head:  Atraumatic.  Neck: Neck supple.  Cardiovascular: Normal rate.   Pulmonary/Chest: Effort normal.  Abdominal: Soft.  Musculoskeletal: She exhibits no edema.  Neurological: She is alert.  Skin: Capillary refill takes less than 2 seconds.     ED Treatments / Results  Labs (all labs ordered are listed, but only abnormal results are displayed) Labs Reviewed - No data to display  EKG  EKG Interpretation None       Radiology No results found.  Procedures Procedures (including critical care time)  Medications Ordered in ED Medications - No data to display   Initial Impression / Assessment and Plan / ED Course  I have reviewed the triage vital signs and the nursing notes.  Pertinent labs &  imaging results that were available during my care of the patient were reviewed by me and considered in my medical decision making (see chart for details).  Clinical Course     Patient with syncopal episode. Patient is not willing to stay and was not willing to participate in the full history and physical because I will not give her additional pain meds. I cannot give her additional pain meds which she has been taking more the medicines and she's been prescribed. Reported has follow-up with her oncologist tomorrow. Discussed extensively with patient and her husband. Not willing to stay. Will discharge AMA.  Final Clinical Impressions(s) / ED Diagnoses   Final diagnoses:  Syncope, unspecified syncope type    New Prescriptions New Prescriptions   No medications on file     Davonna Belling, MD 10/12/16 1447

## 2016-10-12 NOTE — ED Triage Notes (Signed)
Pt bib EMS and coming from home after she had a LOC and then fell. Pt denies any new pain from the fall. Pt is a lung CA pt and is on 3L .  Per EMS report: pt's husband has concerns about her PO intake as it has decreased over past 1-2 weeks. Pt has an empty prescription bottle of 120 tablets 99m of oxycodne that were filled on 09/23/16.  Husband has concerns she may have been taking more pain medication than prescibed.  EMS noted 263mand sluggish pupils bilaterally.  Pt reports generalized pain on arrival to ED and is requesting pain medication.

## 2016-10-12 NOTE — Discharge Instructions (Signed)
You are leaving against our advice. Feel free to return for further treatment.

## 2016-10-13 ENCOUNTER — Ambulatory Visit (HOSPITAL_COMMUNITY): Payer: Medicare Other

## 2016-10-13 ENCOUNTER — Other Ambulatory Visit: Payer: Self-pay

## 2016-10-14 ENCOUNTER — Other Ambulatory Visit: Payer: Self-pay

## 2016-10-18 ENCOUNTER — Other Ambulatory Visit: Payer: Self-pay

## 2016-10-18 ENCOUNTER — Ambulatory Visit (HOSPITAL_COMMUNITY): Admission: RE | Admit: 2016-10-18 | Payer: Medicare Other | Source: Ambulatory Visit

## 2016-10-18 ENCOUNTER — Telehealth: Payer: Self-pay | Admitting: Internal Medicine

## 2016-10-18 NOTE — Telephone Encounter (Signed)
Phyllis [name] called to cancel her appointments for today 1/15  (lab and CT) and tomorrows 1/16 appointment with Dr Julien Nordmann

## 2016-10-19 ENCOUNTER — Ambulatory Visit: Payer: Self-pay | Admitting: Internal Medicine

## 2016-10-19 ENCOUNTER — Telehealth: Payer: Self-pay | Admitting: Medical Oncology

## 2016-10-19 NOTE — Telephone Encounter (Signed)
err

## 2016-10-31 DIAGNOSIS — J449 Chronic obstructive pulmonary disease, unspecified: Secondary | ICD-10-CM | POA: Diagnosis not present

## 2016-11-10 ENCOUNTER — Other Ambulatory Visit: Payer: Self-pay | Admitting: Internal Medicine

## 2016-11-10 DIAGNOSIS — I10 Essential (primary) hypertension: Secondary | ICD-10-CM

## 2016-11-11 ENCOUNTER — Other Ambulatory Visit: Payer: Self-pay | Admitting: Medical Oncology

## 2016-11-18 ENCOUNTER — Other Ambulatory Visit (HOSPITAL_BASED_OUTPATIENT_CLINIC_OR_DEPARTMENT_OTHER): Payer: Medicare Other

## 2016-11-18 ENCOUNTER — Ambulatory Visit (HOSPITAL_COMMUNITY)
Admission: RE | Admit: 2016-11-18 | Discharge: 2016-11-18 | Disposition: A | Discharge status: Home / Self Care | Payer: Medicare Other | Source: Ambulatory Visit | Attending: Internal Medicine | Admitting: Internal Medicine

## 2016-11-18 DIAGNOSIS — C3432 Malignant neoplasm of lower lobe, left bronchus or lung: Secondary | ICD-10-CM | POA: Diagnosis not present

## 2016-11-18 DIAGNOSIS — C3492 Malignant neoplasm of unspecified part of left bronchus or lung: Secondary | ICD-10-CM | POA: Diagnosis present

## 2016-11-18 DIAGNOSIS — R918 Other nonspecific abnormal finding of lung field: Secondary | ICD-10-CM | POA: Diagnosis not present

## 2016-11-18 DIAGNOSIS — R932 Abnormal findings on diagnostic imaging of liver and biliary tract: Secondary | ICD-10-CM | POA: Insufficient documentation

## 2016-11-18 DIAGNOSIS — R590 Localized enlarged lymph nodes: Secondary | ICD-10-CM | POA: Diagnosis not present

## 2016-11-18 LAB — COMPREHENSIVE METABOLIC PANEL
ALT: 26 U/L (ref 0–55)
ANION GAP: 7 meq/L (ref 3–11)
AST: 48 U/L — ABNORMAL HIGH (ref 5–34)
Albumin: 3.1 g/dL — ABNORMAL LOW (ref 3.5–5.0)
Alkaline Phosphatase: 80 U/L (ref 40–150)
BILIRUBIN TOTAL: 0.31 mg/dL (ref 0.20–1.20)
BUN: 10.1 mg/dL (ref 7.0–26.0)
CALCIUM: 8.9 mg/dL (ref 8.4–10.4)
CHLORIDE: 98 meq/L (ref 98–109)
CO2: 31 meq/L — AB (ref 22–29)
CREATININE: 1 mg/dL (ref 0.6–1.1)
EGFR: 73 mL/min/{1.73_m2} — AB (ref >90)
Glucose: 86 mg/dl (ref 70–140)
Potassium: 4.2 mEq/L (ref 3.5–5.1)
Sodium: 136 mEq/L (ref 136–145)
Total Protein: 6.4 g/dL (ref 6.4–8.3)

## 2016-11-18 LAB — CBC WITH DIFFERENTIAL/PLATELET
BASO%: 0.3 % (ref 0.0–2.0)
BASOS ABS: 0 10*3/uL (ref 0.0–0.1)
EOS ABS: 0.1 10*3/uL (ref 0.0–0.5)
EOS%: 1.7 % (ref 0.0–7.0)
HEMATOCRIT: 41.7 % (ref 34.8–46.6)
HGB: 13.7 g/dL (ref 11.6–15.9)
LYMPH#: 1.3 10*3/uL (ref 0.9–3.3)
LYMPH%: 18.7 % (ref 14.0–49.7)
MCH: 32.1 pg (ref 25.1–34.0)
MCHC: 32.9 g/dL (ref 31.5–36.0)
MCV: 97.7 fL (ref 79.5–101.0)
MONO#: 0.5 10*3/uL (ref 0.1–0.9)
MONO%: 7.6 % (ref 0.0–14.0)
NEUT#: 5.1 10*3/uL (ref 1.5–6.5)
NEUT%: 71.7 % (ref 38.4–76.8)
PLATELETS: 147 10*3/uL (ref 145–400)
RBC: 4.27 10*6/uL (ref 3.70–5.45)
RDW: 13.7 % (ref 11.2–14.5)
WBC: 7.1 10*3/uL (ref 3.9–10.3)

## 2016-11-18 MED ORDER — SODIUM CHLORIDE 0.9 % IJ SOLN
INTRAMUSCULAR | Status: AC
  Filled 2016-11-18: qty 50

## 2016-11-18 MED ORDER — IOPAMIDOL (ISOVUE-300) INJECTION 61%
INTRAVENOUS | Status: AC
  Administered 2016-11-18: 75 mL
  Filled 2016-11-18: qty 75

## 2016-11-23 ENCOUNTER — Telehealth: Payer: Self-pay | Admitting: Internal Medicine

## 2016-11-23 ENCOUNTER — Encounter: Payer: Self-pay | Admitting: Internal Medicine

## 2016-11-23 ENCOUNTER — Ambulatory Visit (HOSPITAL_BASED_OUTPATIENT_CLINIC_OR_DEPARTMENT_OTHER): Payer: Medicare Other | Admitting: Internal Medicine

## 2016-11-23 VITALS — BP 112/70 | HR 94 | Temp 97.5°F | Resp 17 | Ht 66.0 in | Wt 124.1 lb

## 2016-11-23 DIAGNOSIS — E46 Unspecified protein-calorie malnutrition: Secondary | ICD-10-CM | POA: Diagnosis not present

## 2016-11-23 DIAGNOSIS — C3432 Malignant neoplasm of lower lobe, left bronchus or lung: Secondary | ICD-10-CM

## 2016-11-23 DIAGNOSIS — R634 Abnormal weight loss: Secondary | ICD-10-CM

## 2016-11-23 DIAGNOSIS — C3431 Malignant neoplasm of lower lobe, right bronchus or lung: Secondary | ICD-10-CM

## 2016-11-23 NOTE — Telephone Encounter (Signed)
Appointments scheduled per 11/23/16 los. Patient was given a copy of the AVS report and appointment schedule, per 11/23/16 los.

## 2016-11-23 NOTE — Progress Notes (Signed)
[place] [phone]   [phone]  OFFICE PROGRESS NOTE  Phyllis Calico, MD [address] [facility] [address] [place] [address]  DIAGNOSIS: Synchronous stage IA (T1a, N0, M0) non-small cell lung cancer presented with bilateral pulmonary nodules in the left lower lobe as well as the right lower lobe. The patient also has questionable left hilar lymphadenopathy diagnosed in August 2017.  PRIOR THERAPY: None  CURRENT THERAPY: Stereotactic radiotherapy to the bilateral pulmonary nodules.  INTERVAL HISTORY: Phyllis [name] 61 y.o. female came to the clinic today for follow-up visit. The patient is feeling fine today with no specific complaints except for the baseline shortness of breath secondary to COPD and she is currently on home oxygen. She denied having any chest pain, cough or hemoptysis. She lost around 17 pounds since her last visit because the patient did not like to go in the cold with her to the grocery store to buy food. She has good appetite. She denied having any fever or chills. She has no nausea or vomiting. The patient had repeat CT scan of the chest performed recently and she is here for evaluation and discussion of her scan results.  MEDICAL HISTORY: Past Medical History:  Diagnosis Date  . ASTHMA 06/06/2009  . BACK PAIN 08/06/2010  . Chronic pancreatitis ([place])   . COPD with emphysema ([place])   . DEPRESSION 06/06/2009   states she is not depressed  . Diabetes mellitus ([place])   . Eczema   . Edema 05/20/2010  . GERD 06/06/2009  . HEPATITIS C WITHOUT HEPATIC COMA 06/06/2009  . HYPERTENSION 06/06/2009  . OSTEOPOROSIS 06/06/2009  . 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 Squamous cell carcinoma 06/15/2016  . TOBACCO USE 06/05/2010  . Unspecified hypothyroidism 06/05/2010  . VITAMIN D DEFICIENCY 06/06/2009    ALLERGIES:  is allergic to amlodipine; meperidine hcl; and naproxen.  MEDICATIONS:  Current Outpatient Prescriptions    Medication Sig Dispense Refill  . Albuterol Sulfate (PROAIR RESPICLICK) 643 (90 BASE) MCG/ACT AEPB Inhale 1 puff into the lungs 4 (four) times daily as needed (For shortness of breath.). 1 each 0  . ALPRAZolam (XANAX) 0.5 MG tablet TAKE 1 TABLET BY MOUTH THREE TIMES DAILY AS NEEDED (Patient taking differently: TAKE 0.5 MG BY MOUTH THREE TIMES DAILY AS NEEDED FOR ANXIETY) 90 tablet 3  . aspirin EC 81 MG tablet Take 81 mg by mouth at bedtime.     [name] atorvastatin (LIPITOR) 40 MG tablet TAKE 1 TABLET(40 MG) BY MOUTH DAILY 6 PM 30 tablet 11  . budesonide (PULMICORT) 0.5 MG/2ML nebulizer solution Take 2 mLs (0.5 mg total) by nebulization 2 (two) times daily. 60 mL 12  . carvedilol (COREG) 6.25 MG tablet TAKE 1 TABLET(6.25 MG) BY MOUTH TWICE DAILY WITH A MEAL 60 tablet 5  . gabapentin (NEURONTIN) 300 MG capsule TAKE 1 CAPSULE BY MOUTH TWICE DAILY (Patient taking differently: TAKE 300 MG BY MOUTH TWICE DAILY) 60 capsule 11  . ipratropium-albuterol (DUONEB) 0.5-2.5 (3) MG/3ML SOLN Take 3 mLs by nebulization 3 (three) times daily. 360 mL 12  . oxyCODONE (ROXICODONE) 15 MG immediate release tablet Take 1 tablet (15 mg total) by mouth every 6 (six) hours as needed. For pain. 120 tablet 0  . potassium chloride SA (KLOR-CON M20) 20 MEQ tablet Take 1 tablet (20 mEq total) by mouth 2 (two) times daily. 60 tablet 2  . triamcinolone ointment (KENALOG) 0.1 % Apply 1 application topically 2 (two) times daily.  30 g 0  . triamterene-hydrochlorothiazide (MAXZIDE-25) 37.5-25 MG tablet TAKE 1 TABLET BY MOUTH DAILY 90 tablet 1  . sodium chloride (OCEAN) 0.65 % SOLN nasal spray Place 1 spray into both nostrils as needed for congestion. (Patient not taking: Reported on 10/12/2016) 480 mL 0   No current facility-administered medications for this visit.     SURGICAL HISTORY:  Past Surgical History:  Procedure Laterality Date  . BREAST LUMPECTOMY     benign, right  . CHOLECYSTECTOMY    . LEFT HEART CATHETERIZATION WITH  CORONARY ANGIOGRAM N/A 03/08/2013   Procedure: LEFT HEART CATHETERIZATION WITH CORONARY ANGIOGRAM;  Surgeon: Clent Demark, MD;  Location: [facility] CATH LAB;  Service: Cardiovascular;  Laterality: N/A;  . LUMBAR LAMINECTOMY    . TONSILLECTOMY    . TUBAL LIGATION      REVIEW OF SYSTEMS:  A comprehensive review of systems was negative except for: Constitutional: positive for fatigue and weight loss Respiratory: positive for dyspnea on exertion   PHYSICAL EXAMINATION: General appearance: alert, cooperative, fatigued and no distress Head: Normocephalic, without obvious abnormality, atraumatic Neck: no adenopathy, no JVD, supple, symmetrical, trachea midline and thyroid not enlarged, symmetric, no tenderness/mass/nodules Lymph nodes: Cervical, supraclavicular, and axillary nodes normal. Resp: wheezes bilaterally Back: symmetric, no curvature. ROM normal. No CVA tenderness. Cardio: regular rate and rhythm, S1, S2 normal, no murmur, click, rub or gallop GI: soft, non-tender; bowel sounds normal; no masses,  no organomegaly Extremities: extremities normal, atraumatic, no cyanosis or edema  ECOG PERFORMANCE STATUS: 1 - Symptomatic but completely ambulatory  Blood pressure 112/70, pulse 94, temperature 97.5 F (36.4 C), temperature source Oral, resp. rate 17, height _0  (1.676 m), weight 124 lb 1.6 oz (56.3 kg), last menstrual period 10/05/1999, SpO2 94 %.  LABORATORY DATA: Lab Results  Component Value Date   WBC 7.1 11/18/2016   HGB 13.7 11/18/2016   HCT 41.7 11/18/2016   MCV 97.7 11/18/2016   PLT 147 11/18/2016      Chemistry      Component Value Date/Time   NA 136 11/18/2016 1334   K 4.2 11/18/2016 1334   CL 91 (L) 05/17/2016 1450   CO2 31 (H) 11/18/2016 1334   BUN 10.1 11/18/2016 1334   CREATININE 1.0 11/18/2016 1334      Component Value Date/Time   CALCIUM 8.9 11/18/2016 1334   ALKPHOS 80 11/18/2016 1334   AST 48 (H) 11/18/2016 1334   ALT 26 11/18/2016 1334   BILITOT 0.31  11/18/2016 1334       RADIOGRAPHIC STUDIES: Ct Chest W Contrast  Result Date: 11/18/2016 CLINICAL DATA:  Patient with history of lung cancer diagnosed 05/2016 status post radiation. Chronic cough. Weight loss. EXAM: CT CHEST WITH CONTRAST TECHNIQUE: Multidetector CT imaging of the chest was performed during intravenous contrast administration. CONTRAST:  75 ISOVUE-300 IOPAMIDOL (ISOVUE-300) INJECTION 61% COMPARISON:  CT chest 08/31/2016. FINDINGS: Cardiovascular: Normal heart size. Trace pericardial fluid. Aorta and main pulmonary artery are normal in caliber. Aortic atherosclerosis. Mediastinum/Nodes: Similar appearing 1.9 cm subcarinal lymph node (image 87; series 2). Similar appearing left hilar lymph node measuring 10 mm (image 69; series 2), previously 9 mm. Multiple subcentimeter mediastinal lymph nodes are demonstrated. Normal esophagus. Lungs/Pleura: Central airways are patent. Centrilobular and paraseptal emphysematous change. Similar appearing spiculated nodule left lower lobe nodule measuring 1.4 x 1.8 cm (image 114; series 5), previously 1.4 x 1.9 cm. Similar-appearing right lower lobe nodule measuring 7 x 4 mm (image 108; series 5). Re- demonstrated left lower lobe  pulmonary sequestration. Upper Abdomen: Cirrhotic morphology of the liver. Re- demonstrated coarse calcifications throughout the pancreatic parenchyma. Musculoskeletal: No aggressive or acute appearing osseous lesions. Stable mid thoracic spine vertebral compression deformities including the T5, T6 and T7 levels. IMPRESSION: Grossly unchanged mediastinal and left hilar lymph nodes. Grossly unchanged bilateral lower lobe nodules. Cirrhotic morphology of the liver. Electronically Signed   By: Lovey Newcomer M.D.   On: 11/18/2016 16:39    ASSESSMENT AND PLAN:  This is a very pleasant 61 years old African-American female with bilateral synchronous non-small cell lung cancer, squamous cell carcinoma diagnosed in August 2017 status post  stereotactic radiotherapy under the care of Dr. Tammi Klippel. The patient is currently on observation and the recent CT scan of the chest showed no evidence for disease progression. I discussed the scan results with the patient today. I recommended for her to continue on observation with repeat CT scan of the chest in 6 months. For the malnutrition and weight loss, I strongly encouraged the patient to increase her oral intake. She was advised to call immediately if she has any concerning symptoms in the interval. The patient voices understanding of current disease status and treatment options and is in agreement with the current care plan.  All questions were answered. The patient knows to call the clinic with any problems, questions or concerns. We can certainly see the patient much sooner if necessary. I spent 10 minutes counseling the patient face to face. The total time spent in the appointment was 15 minutes.  Disclaimer: This note was dictated with voice recognition software. Similar sounding words can inadvertently be transcribed and may not be corrected upon review.

## 2016-12-01 DIAGNOSIS — J449 Chronic obstructive pulmonary disease, unspecified: Secondary | ICD-10-CM | POA: Diagnosis not present

## 2016-12-16 ENCOUNTER — Ambulatory Visit (INDEPENDENT_AMBULATORY_CARE_PROVIDER_SITE_OTHER): Payer: Medicare Other | Admitting: Internal Medicine

## 2016-12-16 ENCOUNTER — Other Ambulatory Visit (INDEPENDENT_AMBULATORY_CARE_PROVIDER_SITE_OTHER): Payer: Medicare Other

## 2016-12-16 VITALS — BP 120/70 | HR 78 | Temp 97.3°F | Ht 66.0 in | Wt 128.0 lb

## 2016-12-16 DIAGNOSIS — R05 Cough: Secondary | ICD-10-CM

## 2016-12-16 DIAGNOSIS — M5416 Radiculopathy, lumbar region: Secondary | ICD-10-CM

## 2016-12-16 DIAGNOSIS — E118 Type 2 diabetes mellitus with unspecified complications: Secondary | ICD-10-CM

## 2016-12-16 DIAGNOSIS — E038 Other specified hypothyroidism: Secondary | ICD-10-CM | POA: Diagnosis not present

## 2016-12-16 DIAGNOSIS — R059 Cough, unspecified: Secondary | ICD-10-CM

## 2016-12-16 DIAGNOSIS — I2510 Atherosclerotic heart disease of native coronary artery without angina pectoris: Secondary | ICD-10-CM

## 2016-12-16 DIAGNOSIS — J449 Chronic obstructive pulmonary disease, unspecified: Secondary | ICD-10-CM

## 2016-12-16 LAB — COMPREHENSIVE METABOLIC PANEL
ALK PHOS: 74 U/L (ref 39–117)
ALT: 8 U/L (ref 0–35)
AST: 15 U/L (ref 0–37)
Albumin: 3.6 g/dL (ref 3.5–5.2)
BUN: 11 mg/dL (ref 6–23)
CO2: 32 mEq/L (ref 19–32)
CREATININE: 0.97 mg/dL (ref 0.40–1.20)
Calcium: 9.1 mg/dL (ref 8.4–10.5)
Chloride: 98 mEq/L (ref 96–112)
GFR: 74.74 mL/min (ref >60.00)
GLUCOSE: 107 mg/dL — AB (ref 70–99)
Potassium: 4.1 mEq/L (ref 3.5–5.1)
Sodium: 133 mEq/L — ABNORMAL LOW (ref 135–145)
TOTAL PROTEIN: 6.8 g/dL (ref 6.0–8.3)
Total Bilirubin: 0.3 mg/dL (ref 0.2–1.2)

## 2016-12-16 LAB — HEMOGLOBIN A1C: Hgb A1c MFr Bld: 6.6 % — ABNORMAL HIGH (ref 4.6–6.5)

## 2016-12-16 LAB — URINALYSIS, ROUTINE W REFLEX MICROSCOPIC
BILIRUBIN URINE: NEGATIVE
Hgb urine dipstick: NEGATIVE
KETONES UR: NEGATIVE
LEUKOCYTES UA: NEGATIVE
Nitrite: NEGATIVE
PH: 6.5 (ref 5.0–8.0)
RBC / HPF: NONE SEEN (ref 0–?)
Specific Gravity, Urine: 1.01 (ref 1.000–1.030)
TOTAL PROTEIN, URINE-UPE24: NEGATIVE
URINE GLUCOSE: NEGATIVE
UROBILINOGEN UA: 0.2 (ref 0.0–1.0)

## 2016-12-16 LAB — LIPID PANEL
Cholesterol: 103 mg/dL (ref 0–200)
HDL: 43.4 mg/dL (ref >39.00)
LDL Cholesterol: 47 mg/dL (ref 0–99)
NONHDL: 59.72
Total CHOL/HDL Ratio: 2
Triglycerides: 64 mg/dL (ref 0.0–149.0)
VLDL: 12.8 mg/dL (ref 0.0–40.0)

## 2016-12-16 LAB — MICROALBUMIN / CREATININE URINE RATIO
CREATININE, U: 100.3 mg/dL
Microalb Creat Ratio: 0.9 mg/g (ref 0.0–30.0)
Microalb, Ur: 0.9 mg/dL (ref 0.0–1.9)

## 2016-12-16 LAB — POCT EXHALED NITRIC OXIDE: FENO LEVEL (PPB): 11

## 2016-12-16 LAB — TSH: TSH: 3.16 u[IU]/mL (ref 0.35–4.50)

## 2016-12-16 MED ORDER — OXYCODONE HCL 15 MG PO TABS: 15.0000 mg | ORAL_TABLET | Freq: Four times a day (QID) | ORAL | 0 refills | Status: DC | PRN

## 2016-12-16 MED ORDER — INDACATEROL-GLYCOPYRROLATE 27.5-15.6 MCG IN CAPS: 1.0000 | ORAL_CAPSULE | Freq: Two times a day (BID) | RESPIRATORY_TRACT | 11 refills | Status: DC

## 2016-12-16 NOTE — Progress Notes (Signed)
Subjective:  Patient ID: Phyllis [name], female    DOB: 27-Sep-1956  Age: 61 y.o. MRN: [id number]  CC: Cough; Diabetes; Hyperlipidemia; and Hypertension   HPI Phyllis [name] presents for f/up - She complains of chronic nonproductive cough with shortness of breath. She is getting some symptom relief by using a nebulizer with albuterol. She has had difficulty in the past using inhalers such as the Elipta and Diskus devices. She is s/p treatment of lung cancer with radiation therapy. She also complains of chronic pain and needs a refill for oxycodone. She tells me her blood pressure has been well controlled.  Outpatient Medications Prior to Visit  Medication Sig Dispense Refill  . Albuterol Sulfate (PROAIR RESPICLICK) 431 (90 BASE) MCG/ACT AEPB Inhale 1 puff into the lungs 4 (four) times daily as needed (For shortness of breath.). 1 each 0  . ALPRAZolam (XANAX) 0.5 MG tablet TAKE 1 TABLET BY MOUTH THREE TIMES DAILY AS NEEDED (Patient taking differently: TAKE 0.5 MG BY MOUTH THREE TIMES DAILY AS NEEDED FOR ANXIETY) 90 tablet 3  . aspirin EC 81 MG tablet Take 81 mg by mouth at bedtime.     [name] atorvastatin (LIPITOR) 40 MG tablet TAKE 1 TABLET(40 MG) BY MOUTH DAILY 6 PM 30 tablet 11  . carvedilol (COREG) 6.25 MG tablet TAKE 1 TABLET(6.25 MG) BY MOUTH TWICE DAILY WITH A MEAL 60 tablet 5  . gabapentin (NEURONTIN) 300 MG capsule TAKE 1 CAPSULE BY MOUTH TWICE DAILY (Patient taking differently: TAKE 300 MG BY MOUTH TWICE DAILY) 60 capsule 11  . ipratropium-albuterol (DUONEB) 0.5-2.5 (3) MG/3ML SOLN Take 3 mLs by nebulization 3 (three) times daily. 360 mL 12  . potassium chloride SA (KLOR-CON M20) 20 MEQ tablet Take 1 tablet (20 mEq total) by mouth 2 (two) times daily. 60 tablet 2  . triamcinolone ointment (KENALOG) 0.1 % Apply 1 application topically 2 (two) times daily. 30 g 0  . triamterene-hydrochlorothiazide (MAXZIDE-25) 37.5-25 MG tablet TAKE 1 TABLET BY MOUTH DAILY 90 tablet 1  . budesonide  (PULMICORT) 0.5 MG/2ML nebulizer solution Take 2 mLs (0.5 mg total) by nebulization 2 (two) times daily. 60 mL 12  . oxyCODONE (ROXICODONE) 15 MG immediate release tablet Take 1 tablet (15 mg total) by mouth every 6 (six) hours as needed. For pain. 120 tablet 0  . sodium chloride (OCEAN) 0.65 % SOLN nasal spray Place 1 spray into both nostrils as needed for congestion. 480 mL 0   No facility-administered medications prior to visit.     ROS Review of Systems  Constitutional: Positive for fatigue. Negative for activity change, appetite change, chills, diaphoresis, fever and unexpected weight change.  HENT: Negative.  Negative for trouble swallowing.   Eyes: Negative for visual disturbance.  Respiratory: Positive for cough, shortness of breath and wheezing. Negative for chest tightness and stridor.   Cardiovascular: Negative.  Negative for chest pain, palpitations and leg swelling.  Gastrointestinal: Negative for abdominal pain, constipation, diarrhea, nausea and vomiting.  Endocrine: Negative for cold intolerance, heat intolerance, polydipsia, polyphagia and polyuria.  Genitourinary: Negative.  Negative for decreased urine volume, difficulty urinating, dysuria, hematuria and urgency.  Musculoskeletal: Positive for back pain. Negative for arthralgias, myalgias and neck pain.  Skin: Negative.   Allergic/Immunologic: Negative.   Neurological: Negative.  Negative for dizziness.  Hematological: Negative.  Negative for adenopathy. Does not bruise/bleed easily.  Psychiatric/Behavioral: Negative.  Negative for dysphoric mood and sleep disturbance. The patient is not nervous/anxious.     Objective:  BP 120/70 (  BP Location: Left Arm, Patient Position: Sitting, Cuff Size: Normal)   Pulse 78   Temp 97.3 F (36.3 C) (Oral)   Ht _0  (1.676 m)   Wt 128 lb (58.1 kg)   LMP 10/05/1999   SpO2 94%   BMI 20.66 kg/m   BP Readings from Last 3 Encounters:  12/16/16 120/70  11/23/16 112/70  10/12/16  104/67    Wt Readings from Last 3 Encounters:  12/16/16 128 lb (58.1 kg)  11/23/16 124 lb 1.6 oz (56.3 kg)  08/18/16 141 lb 4 oz (64.1 kg)    Physical Exam  Constitutional: She is oriented to person, place, and time.  Non-toxic appearance. She does not have a sickly appearance. She does not appear ill. No distress.  HENT:  Mouth/Throat: Oropharynx is clear and moist. No oropharyngeal exudate.  Eyes: Conjunctivae are normal. Right eye exhibits no discharge. Left eye exhibits no discharge. No scleral icterus.  Neck: Normal range of motion. Neck supple. No JVD present. No tracheal deviation present. No thyromegaly present.  Cardiovascular: Normal rate, regular rhythm, normal heart sounds and intact distal pulses.  Exam reveals no gallop and no friction rub.   No murmur heard. Pulmonary/Chest: Effort normal. No accessory muscle usage or stridor. No tachypnea. No respiratory distress. She has no decreased breath sounds. She has wheezes in the right upper field, the right middle field, the right lower field, the left upper field, the left middle field and the left lower field. She has rhonchi in the right upper field, the right middle field, the right lower field, the left upper field, the left middle field and the left lower field. She has no rales.  Diffuse, bilateral, late inspiratory wheezes and rhonchi with good air movement  Abdominal: Soft. Bowel sounds are normal. She exhibits no distension and no mass. There is no tenderness. There is no rebound and no guarding.  Musculoskeletal: Normal range of motion. She exhibits no edema, tenderness or deformity.  Lymphadenopathy:    She has no cervical adenopathy.  Neurological: She is oriented to person, place, and time.  Skin: Skin is warm and dry. No rash noted. She is not diaphoretic. No erythema. No pallor.  Vitals reviewed.   Lab Results  Component Value Date   WBC 7.1 11/18/2016   HGB 13.7 11/18/2016   HCT 41.7 11/18/2016   PLT 147  11/18/2016   GLUCOSE 107 (H) 12/16/2016   CHOL 103 12/16/2016   TRIG 64.0 12/16/2016   HDL 43.40 12/16/2016   LDLCALC 47 12/16/2016   ALT 8 12/16/2016   AST 15 12/16/2016   NA 133 (L) 12/16/2016   K 4.1 12/16/2016   CL 98 12/16/2016   CREATININE 0.97 12/16/2016   BUN 11 12/16/2016   CO2 32 12/16/2016   TSH 3.16 12/16/2016   INR 1.07 05/27/2016   HGBA1C 6.6 (H) 12/16/2016   MICROALBUR 0.9 12/16/2016    Ct Chest W Contrast  Result Date: 11/18/2016 CLINICAL DATA:  Patient with history of lung cancer diagnosed 05/2016 status post radiation. Chronic cough. Weight loss. EXAM: CT CHEST WITH CONTRAST TECHNIQUE: Multidetector CT imaging of the chest was performed during intravenous contrast administration. CONTRAST:  75 ISOVUE-300 IOPAMIDOL (ISOVUE-300) INJECTION 61% COMPARISON:  CT chest 08/31/2016. FINDINGS: Cardiovascular: Normal heart size. Trace pericardial fluid. Aorta and main pulmonary artery are normal in caliber. Aortic atherosclerosis. Mediastinum/Nodes: Similar appearing 1.9 cm subcarinal lymph node (image 87; series 2). Similar appearing left hilar lymph node measuring 10 mm (image 69; series 2), previously  9 mm. Multiple subcentimeter mediastinal lymph nodes are demonstrated. Normal esophagus. Lungs/Pleura: Central airways are patent. Centrilobular and paraseptal emphysematous change. Similar appearing spiculated nodule left lower lobe nodule measuring 1.4 x 1.8 cm (image 114; series 5), previously 1.4 x 1.9 cm. Similar-appearing right lower lobe nodule measuring 7 x 4 mm (image 108; series 5). Re- demonstrated left lower lobe pulmonary sequestration. Upper Abdomen: Cirrhotic morphology of the liver. Re- demonstrated coarse calcifications throughout the pancreatic parenchyma. Musculoskeletal: No aggressive or acute appearing osseous lesions. Stable mid thoracic spine vertebral compression deformities including the T5, T6 and T7 levels. IMPRESSION: Grossly unchanged mediastinal and left  hilar lymph nodes. Grossly unchanged bilateral lower lobe nodules. Cirrhotic morphology of the liver. Electronically Signed   By: Lovey Newcomer M.D.   On: 11/18/2016 16:39    Assessment & Plan:   Phyllis [name] was seen today for cough, diabetes, hyperlipidemia and hypertension.  Diagnoses and all orders for this visit:  Coronary artery disease involving native coronary artery of native heart without angina pectoris- She has had no recent episodes of angina, will continue risk factor modification with an and aspirin therapy. -     Lipid panel; Future  Other specified hypothyroidism- her TSH is in the normal range, thyroid replacement therapy is not indicated. -     TSH; Future  Type 2 diabetes mellitus with complication, without long-term current use of insulin ([place])- her A1c is 6.6%, her blood sugars are adequately well controlled. -     Comprehensive metabolic panel; Future -     Hemoglobin A1c; Future -     Urinalysis, Routine w reflex microscopic; Future -     Microalbumin / creatinine urine ratio; Future  Cough- she has a low FeNO score so I don't think she would benefit from inhaled corticosteroids. -     POCT EXHALED NITRIC OXIDE  COPD (chronic obstructive pulmonary disease) with chronic bronchitis ([place])- I think she would benefit from using a LABA/LAMA combination inhaler. I gave her samples of Utibron and showed her how to use it. She demonstrated proficiency with its use. -     Indacaterol-Glycopyrrolate (UTIBRON NEOHALER) 27.5-15.6 MCG CAPS; Place 1 puff into inhaler and inhale 2 (two) times daily.  Right lumbar radiculitis -     Discontinue: oxyCODONE (ROXICODONE) 15 MG immediate release tablet; Take 1 tablet (15 mg total) by mouth every 6 (six) hours as needed. For pain. -     Discontinue: oxyCODONE (ROXICODONE) 15 MG immediate release tablet; Take 1 tablet (15 mg total) by mouth every 6 (six) hours as needed. For pain. -     oxyCODONE (ROXICODONE) 15 MG immediate release tablet;  Take 1 tablet (15 mg total) by mouth every 6 (six) hours as needed. For pain.   I have discontinued Phyllis [name]'s sodium chloride and budesonide. I am also having her start on Indacaterol-Glycopyrrolate. Additionally, I am having her maintain her aspirin EC, Albuterol Sulfate, triamcinolone ointment, potassium chloride SA, atorvastatin, gabapentin, carvedilol, ipratropium-albuterol, ALPRAZolam, triamterene-hydrochlorothiazide, and oxyCODONE.  Meds ordered this encounter  Medications  . DISCONTD: oxyCODONE (ROXICODONE) 15 MG immediate release tablet    Sig: Take 1 tablet (15 mg total) by mouth every 6 (six) hours as needed. For pain.    Dispense:  120 tablet    Refill:  0    Fill on or after 12/16/16  . DISCONTD: oxyCODONE (ROXICODONE) 15 MG immediate release tablet    Sig: Take 1 tablet (15 mg total) by mouth every 6 (six) hours as needed. For  pain.    Dispense:  120 tablet    Refill:  0    Fill on or after 01/16/17  . oxyCODONE (ROXICODONE) 15 MG immediate release tablet    Sig: Take 1 tablet (15 mg total) by mouth every 6 (six) hours as needed. For pain.    Dispense:  120 tablet    Refill:  0    Fill on or after 02/15/17  . Indacaterol-Glycopyrrolate (UTIBRON NEOHALER) 27.5-15.6 MCG CAPS    Sig: Place 1 puff into inhaler and inhale 2 (two) times daily.    Dispense:  60 capsule    Refill:  11     Follow-up: Return in about 3 months (around 03/18/2017).  [name], MD

## 2016-12-16 NOTE — Patient Instructions (Signed)
Chronic Obstructive Pulmonary Disease Chronic obstructive pulmonary disease (COPD) is a common lung condition in which airflow from the lungs is limited. COPD is a general term that can be used to describe many different lung problems that limit airflow, including both chronic bronchitis and emphysema. If you have COPD, your lung function will probably never return to normal, but there are measures you can take to improve lung function and make yourself feel better. What are the causes?  Smoking (common).  Exposure to secondhand smoke.  Genetic problems.  Chronic inflammatory lung diseases or recurrent infections. What are the signs or symptoms?  Shortness of breath, especially with physical activity.  Deep, persistent (chronic) cough with a large amount of thick mucus.  Wheezing.  Rapid breaths (tachypnea).  Gray or bluish discoloration (cyanosis) of the skin, especially in your fingers, toes, or lips.  Fatigue.  Weight loss.  Frequent infections or episodes when breathing symptoms become much worse (exacerbations).  Chest tightness. How is this diagnosed? Your health care provider will take a medical history and perform a physical examination to diagnose COPD. Additional tests for COPD may include:  Lung (pulmonary) function tests.  Chest X-ray.  CT scan.  Blood tests. How is this treated? Treatment for COPD may include:  Inhaler and nebulizer medicines. These help manage the symptoms of COPD and make your breathing more comfortable.  Supplemental oxygen. Supplemental oxygen is only helpful if you have a low oxygen level in your blood.  Exercise and physical activity. These are beneficial for nearly all people with COPD.  Lung surgery or transplant.  Nutrition therapy to gain weight, if you are underweight.  Pulmonary rehabilitation. This may involve working with a team of health care providers and specialists, such as respiratory, occupational, and physical  therapists. Follow these instructions at home:  Take all medicines (inhaled or pills) as directed by your health care provider.  Avoid over-the-counter medicines or cough syrups that dry up your airway (such as antihistamines) and slow down the elimination of secretions unless instructed otherwise by your health care provider.  If you are a smoker, the most important thing that you can do is stop smoking. Continuing to smoke will cause further lung damage and breathing trouble. Ask your health care provider for help with quitting smoking. He or she can direct you to community resources or hospitals that provide support.  Avoid exposure to irritants such as smoke, chemicals, and fumes that aggravate your breathing.  Use oxygen therapy and pulmonary rehabilitation if directed by your health care provider. If you require home oxygen therapy, ask your health care provider whether you should purchase a pulse oximeter to measure your oxygen level at home.  Avoid contact with individuals who have a contagious illness.  Avoid extreme temperature and humidity changes.  Eat healthy foods. Eating smaller, more frequent meals and resting before meals may help you maintain your strength.  Stay active, but balance activity with periods of rest. Exercise and physical activity will help you maintain your ability to do things you want to do.  Preventing infection and hospitalization is very important when you have COPD. Make sure to receive all the vaccines your health care provider recommends, especially the pneumococcal and influenza vaccines. Ask your health care provider whether you need a pneumonia vaccine.  Learn and use relaxation techniques to manage stress.  Learn and use controlled breathing techniques as directed by your health care provider. Controlled breathing techniques include: 1. Pursed lip breathing. Start by breathing in (inhaling)   through your nose for 1 second. Then, purse your lips as  if you were going to whistle and breathe out (exhale) through the pursed lips for 2 seconds. 2. Diaphragmatic breathing. Start by putting one hand on your abdomen just above your waist. Inhale slowly through your nose. The hand on your abdomen should move out. Then purse your lips and exhale slowly. You should be able to feel the hand on your abdomen moving in as you exhale.  Learn and use controlled coughing to clear mucus from your lungs. Controlled coughing is a series of short, progressive coughs. The steps of controlled coughing are: 1. Lean your head slightly forward. 2. Breathe in deeply using diaphragmatic breathing. 3. Try to hold your breath for 3 seconds. 4. Keep your mouth slightly open while coughing twice. 5. Spit any mucus out into a tissue. 6. Rest and repeat the steps once or twice as needed. Contact a health care provider if:  You are coughing up more mucus than usual.  There is a change in the color or thickness of your mucus.  Your breathing is more labored than usual.  Your breathing is faster than usual. Get help right away if:  You have shortness of breath while you are resting.  You have shortness of breath that prevents you from:  Being able to talk.  Performing your usual physical activities.  You have chest pain lasting longer than 5 minutes.  Your skin color is more cyanotic than usual.  You measure low oxygen saturations for longer than 5 minutes with a pulse oximeter. This information is not intended to replace advice given to you by your health care provider. Make sure you discuss any questions you have with your health care provider. Document Released: 06/30/2005 Document Revised: 02/26/2016 Document Reviewed: 05/17/2013 Elsevier Interactive Patient Education  2017 [employer].

## 2016-12-16 NOTE — Progress Notes (Signed)
Pre visit review using our clinic review tool, if applicable. No additional management support is needed unless otherwise documented below in the visit note.

## 2016-12-18 ENCOUNTER — Encounter: Payer: Self-pay | Admitting: Internal Medicine

## 2016-12-28 ENCOUNTER — Ambulatory Visit: Payer: Self-pay | Admitting: Pulmonary Disease

## 2016-12-29 DIAGNOSIS — J449 Chronic obstructive pulmonary disease, unspecified: Secondary | ICD-10-CM | POA: Diagnosis not present

## 2016-12-30 ENCOUNTER — Encounter: Payer: Self-pay | Admitting: Gastroenterology

## 2017-01-03 ENCOUNTER — Other Ambulatory Visit: Payer: Self-pay | Admitting: Internal Medicine

## 2017-01-03 DIAGNOSIS — J449 Chronic obstructive pulmonary disease, unspecified: Secondary | ICD-10-CM

## 2017-01-03 MED ORDER — GLYCOPYRROLATE-FORMOTEROL 9-4.8 MCG/ACT IN AERO: 2.0000 | INHALATION_SPRAY | Freq: Two times a day (BID) | RESPIRATORY_TRACT | 11 refills | Status: DC

## 2017-01-05 ENCOUNTER — Emergency Department (HOSPITAL_COMMUNITY): Payer: Medicare Other

## 2017-01-05 ENCOUNTER — Observation Stay (HOSPITAL_COMMUNITY)
Admission: EM | Admit: 2017-01-05 | Discharge: 2017-01-07 | Disposition: A | Discharge status: Home / Self Care | Payer: Medicare Other | Attending: Internal Medicine | Admitting: Internal Medicine

## 2017-01-05 ENCOUNTER — Encounter (HOSPITAL_COMMUNITY): Payer: Self-pay | Admitting: Emergency Medicine

## 2017-01-05 DIAGNOSIS — E785 Hyperlipidemia, unspecified: Secondary | ICD-10-CM | POA: Diagnosis present

## 2017-01-05 DIAGNOSIS — I2510 Atherosclerotic heart disease of native coronary artery without angina pectoris: Secondary | ICD-10-CM | POA: Diagnosis not present

## 2017-01-05 DIAGNOSIS — I5032 Chronic diastolic (congestive) heart failure: Secondary | ICD-10-CM | POA: Diagnosis present

## 2017-01-05 DIAGNOSIS — R9431 Abnormal electrocardiogram [ECG] [EKG]: Secondary | ICD-10-CM | POA: Diagnosis present

## 2017-01-05 DIAGNOSIS — Z6820 Body mass index (BMI) 20.0-20.9, adult: Secondary | ICD-10-CM | POA: Diagnosis not present

## 2017-01-05 DIAGNOSIS — C3431 Malignant neoplasm of lower lobe, right bronchus or lung: Secondary | ICD-10-CM | POA: Diagnosis not present

## 2017-01-05 DIAGNOSIS — I272 Pulmonary hypertension, unspecified: Secondary | ICD-10-CM | POA: Diagnosis not present

## 2017-01-05 DIAGNOSIS — Z79899 Other long term (current) drug therapy: Secondary | ICD-10-CM | POA: Insufficient documentation

## 2017-01-05 DIAGNOSIS — J439 Emphysema, unspecified: Secondary | ICD-10-CM | POA: Insufficient documentation

## 2017-01-05 DIAGNOSIS — I10 Essential (primary) hypertension: Secondary | ICD-10-CM | POA: Diagnosis present

## 2017-01-05 DIAGNOSIS — E1142 Type 2 diabetes mellitus with diabetic polyneuropathy: Secondary | ICD-10-CM | POA: Diagnosis not present

## 2017-01-05 DIAGNOSIS — F418 Other specified anxiety disorders: Secondary | ICD-10-CM | POA: Diagnosis not present

## 2017-01-05 DIAGNOSIS — S299XXA Unspecified injury of thorax, initial encounter: Secondary | ICD-10-CM | POA: Diagnosis not present

## 2017-01-05 DIAGNOSIS — T148XXA Other injury of unspecified body region, initial encounter: Secondary | ICD-10-CM | POA: Diagnosis not present

## 2017-01-05 DIAGNOSIS — I313 Pericardial effusion (noninflammatory): Secondary | ICD-10-CM | POA: Diagnosis not present

## 2017-01-05 DIAGNOSIS — R079 Chest pain, unspecified: Secondary | ICD-10-CM

## 2017-01-05 DIAGNOSIS — J449 Chronic obstructive pulmonary disease, unspecified: Secondary | ICD-10-CM | POA: Diagnosis not present

## 2017-01-05 DIAGNOSIS — I4581 Long QT syndrome: Secondary | ICD-10-CM | POA: Diagnosis not present

## 2017-01-05 DIAGNOSIS — M5489 Other dorsalgia: Secondary | ICD-10-CM | POA: Diagnosis not present

## 2017-01-05 DIAGNOSIS — E440 Moderate protein-calorie malnutrition: Secondary | ICD-10-CM | POA: Diagnosis not present

## 2017-01-05 DIAGNOSIS — Z923 Personal history of irradiation: Secondary | ICD-10-CM | POA: Insufficient documentation

## 2017-01-05 DIAGNOSIS — Z87891 Personal history of nicotine dependence: Secondary | ICD-10-CM | POA: Diagnosis not present

## 2017-01-05 DIAGNOSIS — Z7982 Long term (current) use of aspirin: Secondary | ICD-10-CM | POA: Diagnosis not present

## 2017-01-05 DIAGNOSIS — I700 Atherosclerosis of aorta: Secondary | ICD-10-CM | POA: Diagnosis not present

## 2017-01-05 DIAGNOSIS — I110 Hypertensive heart disease with heart failure: Principal | ICD-10-CM | POA: Insufficient documentation

## 2017-01-05 DIAGNOSIS — C3432 Malignant neoplasm of lower lobe, left bronchus or lung: Secondary | ICD-10-CM | POA: Diagnosis present

## 2017-01-05 DIAGNOSIS — E118 Type 2 diabetes mellitus with unspecified complications: Secondary | ICD-10-CM | POA: Diagnosis present

## 2017-01-05 HISTORY — DX: Abnormal electrocardiogram (ECG) (EKG): R94.31

## 2017-01-05 LAB — COMPREHENSIVE METABOLIC PANEL
ALK PHOS: 79 U/L (ref 38–126)
ALT: 21 U/L (ref 14–54)
AST: 37 U/L (ref 15–41)
Albumin: 3.1 g/dL — ABNORMAL LOW (ref 3.5–5.0)
Anion gap: 8 (ref 5–15)
BUN: <5 mg/dL — ABNORMAL LOW (ref 6–20)
CALCIUM: 8.3 mg/dL — AB (ref 8.9–10.3)
CO2: 27 mmol/L (ref 22–32)
CREATININE: 0.88 mg/dL (ref 0.44–1.00)
Chloride: 96 mmol/L — ABNORMAL LOW (ref 101–111)
GFR calc Af Amer: >60 mL/min (ref >60)
GFR calc non Af Amer: >60 mL/min (ref >60)
GLUCOSE: 105 mg/dL — AB (ref 65–99)
Potassium: 4 mmol/L (ref 3.5–5.1)
SODIUM: 131 mmol/L — AB (ref 135–145)
Total Bilirubin: 1.3 mg/dL — ABNORMAL HIGH (ref 0.3–1.2)
Total Protein: 6.3 g/dL — ABNORMAL LOW (ref 6.5–8.1)

## 2017-01-05 LAB — CBC WITH DIFFERENTIAL/PLATELET
Basophils Absolute: 0 10*3/uL (ref 0.0–0.1)
Basophils Relative: 0 %
Eosinophils Absolute: 0.1 10*3/uL (ref 0.0–0.7)
Eosinophils Relative: 1 %
HCT: 49.4 % — ABNORMAL HIGH (ref 36.0–46.0)
HEMOGLOBIN: 16.1 g/dL — AB (ref 12.0–15.0)
LYMPHS ABS: 1.1 10*3/uL (ref 0.7–4.0)
LYMPHS PCT: 19 %
MCH: 31.9 pg (ref 26.0–34.0)
MCHC: 32.6 g/dL (ref 30.0–36.0)
MCV: 98 fL (ref 78.0–100.0)
Monocytes Absolute: 0.4 10*3/uL (ref 0.1–1.0)
Monocytes Relative: 7 %
NEUTROS PCT: 73 %
Neutro Abs: 4.1 10*3/uL (ref 1.7–7.7)
Platelets: 166 10*3/uL (ref 150–400)
RBC: 5.04 MIL/uL (ref 3.87–5.11)
RDW: 13.6 % (ref 11.5–15.5)
WBC: 5.7 10*3/uL (ref 4.0–10.5)

## 2017-01-05 LAB — URINALYSIS, ROUTINE W REFLEX MICROSCOPIC
BILIRUBIN URINE: NEGATIVE
Glucose, UA: NEGATIVE mg/dL
Hgb urine dipstick: NEGATIVE
KETONES UR: NEGATIVE mg/dL
Leukocytes, UA: NEGATIVE
Nitrite: NEGATIVE
PROTEIN: NEGATIVE mg/dL
SPECIFIC GRAVITY, URINE: 1.009 (ref 1.005–1.030)
pH: 7 (ref 5.0–8.0)

## 2017-01-05 LAB — RAPID URINE DRUG SCREEN, HOSP PERFORMED
AMPHETAMINES: NOT DETECTED
Barbiturates: NOT DETECTED
Benzodiazepines: POSITIVE — AB
Cocaine: NOT DETECTED
OPIATES: NOT DETECTED
TETRAHYDROCANNABINOL: NOT DETECTED

## 2017-01-05 LAB — I-STAT CHEM 8, ED
BUN: <3 mg/dL — ABNORMAL LOW (ref 6–20)
CALCIUM ION: 1.05 mmol/L — AB (ref 1.15–1.40)
Chloride: 98 mmol/L — ABNORMAL LOW (ref 101–111)
Creatinine, Ser: 1 mg/dL (ref 0.44–1.00)
Glucose, Bld: 102 mg/dL — ABNORMAL HIGH (ref 65–99)
HCT: 51 % — ABNORMAL HIGH (ref 36.0–46.0)
HEMOGLOBIN: 17.3 g/dL — AB (ref 12.0–15.0)
Potassium: 3.6 mmol/L (ref 3.5–5.1)
Sodium: 138 mmol/L (ref 135–145)
TCO2: 32 mmol/L (ref 0–100)

## 2017-01-05 LAB — TROPONIN I: Troponin I: <0.03 ng/mL (ref <0.03)

## 2017-01-05 LAB — I-STAT TROPONIN, ED: TROPONIN I, POC: 0 ng/mL (ref 0.00–0.08)

## 2017-01-05 LAB — GLUCOSE, CAPILLARY: Glucose-Capillary: 103 mg/dL — ABNORMAL HIGH (ref 65–99)

## 2017-01-05 LAB — MAGNESIUM: Magnesium: 1.7 mg/dL (ref 1.7–2.4)

## 2017-01-05 MED ORDER — SODIUM CHLORIDE 0.9 % IV SOLN
INTRAVENOUS | Status: DC
  Administered 2017-01-05: 21:00:00 via INTRAVENOUS

## 2017-01-05 MED ORDER — INSULIN ASPART 100 UNIT/ML ~~LOC~~ SOLN
0.0000 [IU] | Freq: Three times a day (TID) | SUBCUTANEOUS | Status: DC
  Administered 2017-01-06: 1 [IU] via SUBCUTANEOUS
  Administered 2017-01-06: 2 [IU] via SUBCUTANEOUS

## 2017-01-05 MED ORDER — ATORVASTATIN CALCIUM 40 MG PO TABS
40.0000 mg | ORAL_TABLET | Freq: Every day | ORAL | Status: DC
  Administered 2017-01-05 – 2017-01-06 (×2): 40 mg via ORAL
  Filled 2017-01-05 (×2): qty 1

## 2017-01-05 MED ORDER — ASPIRIN 81 MG PO CHEW
324.0000 mg | CHEWABLE_TABLET | Freq: Once | ORAL | Status: AC
  Administered 2017-01-05: 324 mg via ORAL
  Filled 2017-01-05: qty 4

## 2017-01-05 MED ORDER — ALBUTEROL SULFATE (2.5 MG/3ML) 0.083% IN NEBU: 2.5000 mg | INHALATION_SOLUTION | RESPIRATORY_TRACT | Status: DC | PRN

## 2017-01-05 MED ORDER — HYDRALAZINE HCL 20 MG/ML IJ SOLN
5.0000 mg | INTRAMUSCULAR | Status: DC | PRN
Start: 2017-01-05 — End: 2017-01-07

## 2017-01-05 MED ORDER — MORPHINE SULFATE (PF) 4 MG/ML IV SOLN
2.0000 mg | Freq: Once | INTRAVENOUS | Status: DC
  Filled 2017-01-05: qty 1

## 2017-01-05 MED ORDER — TRIAMCINOLONE ACETONIDE 0.1 % EX OINT
1.0000 "application " | TOPICAL_OINTMENT | Freq: Two times a day (BID) | CUTANEOUS | Status: DC | PRN
  Filled 2017-01-05: qty 15

## 2017-01-05 MED ORDER — OXYCODONE HCL 5 MG PO TABS
15.0000 mg | ORAL_TABLET | Freq: Four times a day (QID) | ORAL | Status: DC | PRN
  Administered 2017-01-05 – 2017-01-07 (×6): 15 mg via ORAL
  Filled 2017-01-05 (×6): qty 3

## 2017-01-05 MED ORDER — ENOXAPARIN SODIUM 40 MG/0.4ML ~~LOC~~ SOLN
40.0000 mg | SUBCUTANEOUS | Status: DC
  Administered 2017-01-05 – 2017-01-06 (×2): 40 mg via SUBCUTANEOUS
  Filled 2017-01-05 (×2): qty 0.4

## 2017-01-05 MED ORDER — ACETAMINOPHEN 325 MG PO TABS: 650.0000 mg | ORAL_TABLET | ORAL | Status: DC | PRN

## 2017-01-05 MED ORDER — ORAL CARE MOUTH RINSE: 15.0000 mL | Freq: Two times a day (BID) | OROMUCOSAL | Status: DC

## 2017-01-05 MED ORDER — MORPHINE SULFATE (PF) 4 MG/ML IV SOLN
2.0000 mg | Freq: Once | INTRAVENOUS | Status: AC
  Administered 2017-01-05: 2 mg via INTRAVENOUS
  Filled 2017-01-05: qty 1

## 2017-01-05 MED ORDER — INSULIN ASPART 100 UNIT/ML ~~LOC~~ SOLN: 0.0000 [IU] | Freq: Every day | SUBCUTANEOUS | Status: DC

## 2017-01-05 MED ORDER — MAGNESIUM SULFATE IN D5W 1-5 GM/100ML-% IV SOLN
1.0000 g | Freq: Once | INTRAVENOUS | Status: AC
  Administered 2017-01-05: 1 g via INTRAVENOUS
  Filled 2017-01-05: qty 100

## 2017-01-05 MED ORDER — ASPIRIN EC 81 MG PO TBEC
81.0000 mg | DELAYED_RELEASE_TABLET | Freq: Every day | ORAL | Status: DC
  Administered 2017-01-05 – 2017-01-06 (×2): 81 mg via ORAL
  Filled 2017-01-05 (×2): qty 1

## 2017-01-05 MED ORDER — MORPHINE SULFATE (PF) 4 MG/ML IV SOLN
2.0000 mg | Freq: Once | INTRAVENOUS | Status: DC
Start: 2017-01-05 — End: 2017-01-05
  Filled 2017-01-05: qty 1

## 2017-01-05 MED ORDER — ZOLPIDEM TARTRATE 5 MG PO TABS: 5.0000 mg | ORAL_TABLET | Freq: Every evening | ORAL | Status: DC | PRN

## 2017-01-05 MED ORDER — GABAPENTIN 300 MG PO CAPS
300.0000 mg | ORAL_CAPSULE | Freq: Two times a day (BID) | ORAL | Status: DC
  Administered 2017-01-05 – 2017-01-07 (×4): 300 mg via ORAL
  Filled 2017-01-05 (×4): qty 1

## 2017-01-05 MED ORDER — MORPHINE SULFATE (PF) 4 MG/ML IV SOLN
2.0000 mg | INTRAVENOUS | Status: DC | PRN
  Administered 2017-01-05 – 2017-01-07 (×8): 2 mg via INTRAVENOUS
  Filled 2017-01-05 (×7): qty 1

## 2017-01-05 MED ORDER — POLYETHYLENE GLYCOL 3350 17 G PO PACK: 17.0000 g | PACK | Freq: Every day | ORAL | Status: DC | PRN

## 2017-01-05 MED ORDER — NITROGLYCERIN 0.4 MG SL SUBL: 0.4000 mg | SUBLINGUAL_TABLET | SUBLINGUAL | Status: DC | PRN

## 2017-01-05 MED ORDER — CARVEDILOL 6.25 MG PO TABS
6.2500 mg | ORAL_TABLET | Freq: Two times a day (BID) | ORAL | Status: DC
  Administered 2017-01-06 – 2017-01-07 (×3): 6.25 mg via ORAL
  Filled 2017-01-05 (×3): qty 1

## 2017-01-05 MED ORDER — TRIAMTERENE-HCTZ 37.5-25 MG PO TABS
1.0000 | ORAL_TABLET | Freq: Every day | ORAL | Status: DC
  Administered 2017-01-05 – 2017-01-07 (×3): 1 via ORAL
  Filled 2017-01-05 (×3): qty 1

## 2017-01-05 MED ORDER — GUAIFENESIN ER 600 MG PO TB12: 600.0000 mg | ORAL_TABLET | Freq: Two times a day (BID) | ORAL | Status: DC | PRN

## 2017-01-05 MED ORDER — ALPRAZOLAM 0.5 MG PO TABS
0.5000 mg | ORAL_TABLET | Freq: Three times a day (TID) | ORAL | Status: DC | PRN
Start: 2017-01-05 — End: 2017-01-07
  Administered 2017-01-05 – 2017-01-06 (×2): 0.5 mg via ORAL
  Filled 2017-01-05 (×2): qty 1

## 2017-01-05 NOTE — ED Notes (Signed)
Cardiology at bedside.

## 2017-01-05 NOTE — ED Notes (Signed)
Lab notified that there was not enough sample for CMP. Will redraw

## 2017-01-05 NOTE — ED Notes (Signed)
Chest xray changed to portable due to questionable EKG and chest pain. RN felt it was unsafe to transport this patient at this time. Will order chest xray 2 view if reassuring lab work.

## 2017-01-05 NOTE — ED Notes (Signed)
Lab notified that sample hemolyzed and would need to be redrawn.
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
## 2017-01-05 NOTE — ED Notes (Signed)
Pt. Requesting medication for chest pain. EDP made aware.

## 2017-01-05 NOTE — ED Notes (Signed)
RN attempted 2x blood draw unsuccessfully. Will contact phlebotomy.

## 2017-01-05 NOTE — ED Notes (Addendum)
Pt. Calling out for more pain medication. EDP made aware.

## 2017-01-05 NOTE — ED Notes (Signed)
While wasting morphine with Hayley RN-morphine dropped into sharps container. EDP made aware. Medication to be reordered.

## 2017-01-06 ENCOUNTER — Observation Stay (HOSPITAL_BASED_OUTPATIENT_CLINIC_OR_DEPARTMENT_OTHER): Payer: Medicare Other

## 2017-01-06 DIAGNOSIS — I2510 Atherosclerotic heart disease of native coronary artery without angina pectoris: Secondary | ICD-10-CM | POA: Diagnosis not present

## 2017-01-06 DIAGNOSIS — E785 Hyperlipidemia, unspecified: Secondary | ICD-10-CM | POA: Diagnosis not present

## 2017-01-06 DIAGNOSIS — R9431 Abnormal electrocardiogram [ECG] [EKG]: Secondary | ICD-10-CM | POA: Diagnosis not present

## 2017-01-06 DIAGNOSIS — I5032 Chronic diastolic (congestive) heart failure: Secondary | ICD-10-CM | POA: Diagnosis not present

## 2017-01-06 DIAGNOSIS — R072 Precordial pain: Secondary | ICD-10-CM

## 2017-01-06 DIAGNOSIS — C3432 Malignant neoplasm of lower lobe, left bronchus or lung: Secondary | ICD-10-CM

## 2017-01-06 DIAGNOSIS — I10 Essential (primary) hypertension: Secondary | ICD-10-CM

## 2017-01-06 DIAGNOSIS — J439 Emphysema, unspecified: Secondary | ICD-10-CM | POA: Diagnosis not present

## 2017-01-06 DIAGNOSIS — J449 Chronic obstructive pulmonary disease, unspecified: Secondary | ICD-10-CM | POA: Diagnosis not present

## 2017-01-06 DIAGNOSIS — E118 Type 2 diabetes mellitus with unspecified complications: Secondary | ICD-10-CM | POA: Diagnosis not present

## 2017-01-06 DIAGNOSIS — R079 Chest pain, unspecified: Secondary | ICD-10-CM | POA: Diagnosis not present

## 2017-01-06 DIAGNOSIS — C3431 Malignant neoplasm of lower lobe, right bronchus or lung: Secondary | ICD-10-CM | POA: Diagnosis not present

## 2017-01-06 DIAGNOSIS — I110 Hypertensive heart disease with heart failure: Secondary | ICD-10-CM | POA: Diagnosis not present

## 2017-01-06 DIAGNOSIS — E440 Moderate protein-calorie malnutrition: Secondary | ICD-10-CM

## 2017-01-06 LAB — GLUCOSE, CAPILLARY
GLUCOSE-CAPILLARY: 131 mg/dL — AB (ref 65–99)
GLUCOSE-CAPILLARY: 102 mg/dL — AB (ref 65–99)
GLUCOSE-CAPILLARY: 111 mg/dL — AB (ref 65–99)
Glucose-Capillary: 192 mg/dL — ABNORMAL HIGH (ref 65–99)

## 2017-01-06 LAB — ECHOCARDIOGRAM COMPLETE
Height: 66 in
Weight: 2048 oz

## 2017-01-06 LAB — LIPID PANEL
CHOL/HDL RATIO: 2.4 ratio
Cholesterol: 85 mg/dL (ref 0–200)
HDL: 35 mg/dL — AB (ref >40)
LDL CALC: 39 mg/dL (ref 0–99)
Triglycerides: 54 mg/dL (ref <150)
VLDL: 11 mg/dL (ref 0–40)

## 2017-01-06 LAB — TROPONIN I

## 2017-01-06 LAB — HIV ANTIBODY (ROUTINE TESTING W REFLEX): HIV SCREEN 4TH GENERATION: NONREACTIVE

## 2017-01-06 LAB — BRAIN NATRIURETIC PEPTIDE: B Natriuretic Peptide: 351.2 pg/mL — ABNORMAL HIGH (ref 0.0–100.0)

## 2017-01-06 MED ORDER — ALBUTEROL SULFATE (2.5 MG/3ML) 0.083% IN NEBU
2.5000 mg | INHALATION_SOLUTION | Freq: Three times a day (TID) | RESPIRATORY_TRACT | Status: DC
  Administered 2017-01-06: 2.5 mg via RESPIRATORY_TRACT
  Filled 2017-01-06 (×2): qty 3

## 2017-01-06 MED ORDER — ALBUTEROL SULFATE (2.5 MG/3ML) 0.083% IN NEBU
2.5000 mg | INHALATION_SOLUTION | Freq: Four times a day (QID) | RESPIRATORY_TRACT | Status: DC
  Administered 2017-01-06: 2.5 mg via RESPIRATORY_TRACT
  Filled 2017-01-06: qty 3

## 2017-01-06 NOTE — Care Management Obs Status (Signed)
[place] NOTIFICATION   Patient Details  Name: Phyllis [name] MRN: [id number] Date of Birth: [birth year]   Medicare Observation Status Notification Given:  Yes    Dawayne Patricia, RN 01/06/2017, 4:56 PM
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
## 2017-01-06 NOTE — Progress Notes (Signed)
  Echocardiogram 2D Echocardiogram has been performed.  Phyllis [name] L Androw 01/06/2017, 1:01 PM
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
## 2017-01-07 ENCOUNTER — Encounter (HOSPITAL_COMMUNITY): Payer: Self-pay | Admitting: Internal Medicine

## 2017-01-07 DIAGNOSIS — J449 Chronic obstructive pulmonary disease, unspecified: Secondary | ICD-10-CM | POA: Diagnosis not present

## 2017-01-07 DIAGNOSIS — R9431 Abnormal electrocardiogram [ECG] [EKG]: Secondary | ICD-10-CM

## 2017-01-07 DIAGNOSIS — J439 Emphysema, unspecified: Secondary | ICD-10-CM | POA: Diagnosis not present

## 2017-01-07 DIAGNOSIS — R079 Chest pain, unspecified: Secondary | ICD-10-CM | POA: Diagnosis not present

## 2017-01-07 DIAGNOSIS — I2510 Atherosclerotic heart disease of native coronary artery without angina pectoris: Secondary | ICD-10-CM | POA: Diagnosis not present

## 2017-01-07 DIAGNOSIS — I5032 Chronic diastolic (congestive) heart failure: Secondary | ICD-10-CM | POA: Diagnosis not present

## 2017-01-07 DIAGNOSIS — E785 Hyperlipidemia, unspecified: Secondary | ICD-10-CM | POA: Diagnosis not present

## 2017-01-07 DIAGNOSIS — C3431 Malignant neoplasm of lower lobe, right bronchus or lung: Secondary | ICD-10-CM | POA: Diagnosis not present

## 2017-01-07 DIAGNOSIS — I110 Hypertensive heart disease with heart failure: Secondary | ICD-10-CM | POA: Diagnosis not present

## 2017-01-07 HISTORY — DX: Abnormal electrocardiogram (ECG) (EKG): R94.31

## 2017-01-07 LAB — HEMOGLOBIN A1C
HEMOGLOBIN A1C: 6.1 % — AB (ref 4.8–5.6)
Mean Plasma Glucose: 128 mg/dL

## 2017-01-07 LAB — GLUCOSE, CAPILLARY: Glucose-Capillary: 150 mg/dL — ABNORMAL HIGH (ref 65–99)

## 2017-01-07 MED ORDER — ACETAMINOPHEN 325 MG PO TABS: 650.0000 mg | ORAL_TABLET | ORAL | Status: DC | PRN

## 2017-01-07 NOTE — Progress Notes (Signed)
Order received to discharge patient.  Telemetry monitor removed and CCMD notified.  PIV access removed.  Discharge instructions, follow up, medications and instructions for their use discussed with patient. 

## 2017-01-07 NOTE — Progress Notes (Addendum)
No arrhythmias. Chest pain has improved.  Echo results:  Study Conclusions  - Left ventricle: The cavity size was normal. Wall thickness was   increased in a pattern of severe LVH. Systolic function was   normal. The estimated ejection fraction was in the range of 60%   to 65%. Wall motion was normal; there were no regional wall   motion abnormalities. Doppler parameters are consistent with   abnormal left ventricular relaxation (grade 1 diastolic   dysfunction). The E/e&' ratio is between 8-15, suggesting   indeterminate LV filling pressure. - Aortic valve: Trileaflet. Sclerosis without stenosis. There was   no regurgitation. - Left atrium: The atrium was normal in size. - Right ventricle: The cavity size was mildly dilated. Mildly   hypokinetic. The moderator band was prominent. - Systemic veins: The IVC measures <2.1 cm, but does not collapse   >50%, suggesting an elevated RA pressure of 8 mmHg. - Pericardium, extracardiac: Small pericardial effusion. Features   were not consistent with tamponade physiology.  Impressions:  - Compared to a prior study in 2016, the LVEF is slightly lower at   60-65%, there is mild RV dilitation and hypokinesis and there is   a small pericardial effusion without tamponade features.   Potassium and magnesium reordered. Avoid medications that can prolong QT.  Probably eligible for discharge. We will sign off.

## 2017-01-07 NOTE — Discharge Instructions (Signed)
Chest Wall Pain Chest wall pain is pain in or around the bones and muscles of your chest. Sometimes, an injury causes this pain. Sometimes, the cause may not be known. This pain may take several weeks or longer to get better. Follow these instructions at home: Pay attention to any changes in your symptoms. Take these actions to help with your pain:  Rest as told by your health care provider.  Avoid activities that cause pain. These include any activities that use your chest muscles or your abdominal and side muscles to lift heavy items.  If directed, apply ice to the painful area:  Put ice in a plastic bag.  Place a towel between your skin and the bag.  Leave the ice on for 20 minutes, 2-3 times per day.  Take over-the-counter and prescription medicines only as told by your health care provider.  Do not use tobacco products, including cigarettes, chewing tobacco, and e-cigarettes. If you need help quitting, ask your health care provider.  Keep all follow-up visits as told by your health care provider. This is important. Contact a health care provider if:  You have a fever.  Your chest pain becomes worse.  You have new symptoms. Get help right away if:  You have nausea or vomiting.  You feel sweaty or light-headed.  You have a cough with phlegm (sputum) or you cough up blood.  You develop shortness of breath. This information is not intended to replace advice given to you by your health care provider. Make sure you discuss any questions you have with your health care provider. Document Released: 09/20/2005 Document Revised: 01/29/2016 Document Reviewed: 12/16/2014 Elsevier Interactive Patient Education  2017 [employer].
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
## 2017-01-08 ENCOUNTER — Other Ambulatory Visit: Payer: Self-pay | Admitting: Internal Medicine

## 2017-01-08 DIAGNOSIS — F418 Other specified anxiety disorders: Secondary | ICD-10-CM

## 2017-01-10 ENCOUNTER — Other Ambulatory Visit: Payer: Self-pay | Admitting: Internal Medicine

## 2017-01-10 DIAGNOSIS — F418 Other specified anxiety disorders: Secondary | ICD-10-CM

## 2017-01-11 ENCOUNTER — Emergency Department (HOSPITAL_COMMUNITY): Payer: Medicare Other

## 2017-01-11 ENCOUNTER — Encounter (HOSPITAL_COMMUNITY): Payer: Self-pay | Admitting: Emergency Medicine

## 2017-01-11 ENCOUNTER — Inpatient Hospital Stay (HOSPITAL_COMMUNITY)
Admission: EM | Admit: 2017-01-11 | Discharge: 2017-01-14 | DRG: 054 | Disposition: A | Discharge status: Home Health | Payer: Medicare Other | Attending: Nephrology | Admitting: Nephrology

## 2017-01-11 DIAGNOSIS — Z886 Allergy status to analgesic agent status: Secondary | ICD-10-CM

## 2017-01-11 DIAGNOSIS — F419 Anxiety disorder, unspecified: Secondary | ICD-10-CM | POA: Diagnosis present

## 2017-01-11 DIAGNOSIS — I5032 Chronic diastolic (congestive) heart failure: Secondary | ICD-10-CM | POA: Diagnosis present

## 2017-01-11 DIAGNOSIS — K219 Gastro-esophageal reflux disease without esophagitis: Secondary | ICD-10-CM | POA: Diagnosis present

## 2017-01-11 DIAGNOSIS — C7800 Secondary malignant neoplasm of unspecified lung: Secondary | ICD-10-CM | POA: Diagnosis not present

## 2017-01-11 DIAGNOSIS — Z9049 Acquired absence of other specified parts of digestive tract: Secondary | ICD-10-CM | POA: Diagnosis not present

## 2017-01-11 DIAGNOSIS — J439 Emphysema, unspecified: Secondary | ICD-10-CM | POA: Diagnosis not present

## 2017-01-11 DIAGNOSIS — Z888 Allergy status to other drugs, medicaments and biological substances status: Secondary | ICD-10-CM | POA: Diagnosis not present

## 2017-01-11 DIAGNOSIS — E039 Hypothyroidism, unspecified: Secondary | ICD-10-CM | POA: Diagnosis present

## 2017-01-11 DIAGNOSIS — J449 Chronic obstructive pulmonary disease, unspecified: Secondary | ICD-10-CM | POA: Diagnosis present

## 2017-01-11 DIAGNOSIS — R627 Adult failure to thrive: Secondary | ICD-10-CM | POA: Diagnosis present

## 2017-01-11 DIAGNOSIS — Z825 Family history of asthma and other chronic lower respiratory diseases: Secondary | ICD-10-CM

## 2017-01-11 DIAGNOSIS — Z87891 Personal history of nicotine dependence: Secondary | ICD-10-CM

## 2017-01-11 DIAGNOSIS — I10 Essential (primary) hypertension: Secondary | ICD-10-CM | POA: Diagnosis not present

## 2017-01-11 DIAGNOSIS — I272 Pulmonary hypertension, unspecified: Secondary | ICD-10-CM | POA: Diagnosis not present

## 2017-01-11 DIAGNOSIS — C7931 Secondary malignant neoplasm of brain: Secondary | ICD-10-CM | POA: Diagnosis not present

## 2017-01-11 DIAGNOSIS — E785 Hyperlipidemia, unspecified: Secondary | ICD-10-CM | POA: Diagnosis not present

## 2017-01-11 DIAGNOSIS — G936 Cerebral edema: Secondary | ICD-10-CM | POA: Diagnosis not present

## 2017-01-11 DIAGNOSIS — G939 Disorder of brain, unspecified: Secondary | ICD-10-CM | POA: Diagnosis not present

## 2017-01-11 DIAGNOSIS — I619 Nontraumatic intracerebral hemorrhage, unspecified: Secondary | ICD-10-CM | POA: Diagnosis not present

## 2017-01-11 DIAGNOSIS — Z8249 Family history of ischemic heart disease and other diseases of the circulatory system: Secondary | ICD-10-CM

## 2017-01-11 DIAGNOSIS — Z7982 Long term (current) use of aspirin: Secondary | ICD-10-CM | POA: Diagnosis not present

## 2017-01-11 DIAGNOSIS — C3432 Malignant neoplasm of lower lobe, left bronchus or lung: Secondary | ICD-10-CM | POA: Diagnosis not present

## 2017-01-11 DIAGNOSIS — I110 Hypertensive heart disease with heart failure: Secondary | ICD-10-CM | POA: Diagnosis not present

## 2017-01-11 DIAGNOSIS — I629 Nontraumatic intracranial hemorrhage, unspecified: Secondary | ICD-10-CM

## 2017-01-11 DIAGNOSIS — E118 Type 2 diabetes mellitus with unspecified complications: Secondary | ICD-10-CM | POA: Diagnosis not present

## 2017-01-11 DIAGNOSIS — Z794 Long term (current) use of insulin: Secondary | ICD-10-CM | POA: Diagnosis not present

## 2017-01-11 DIAGNOSIS — Z841 Family history of disorders of kidney and ureter: Secondary | ICD-10-CM

## 2017-01-11 DIAGNOSIS — R9431 Abnormal electrocardiogram [ECG] [EKG]: Secondary | ICD-10-CM | POA: Diagnosis present

## 2017-01-11 DIAGNOSIS — I2510 Atherosclerotic heart disease of native coronary artery without angina pectoris: Secondary | ICD-10-CM | POA: Diagnosis present

## 2017-01-11 DIAGNOSIS — M810 Age-related osteoporosis without current pathological fracture: Secondary | ICD-10-CM | POA: Diagnosis not present

## 2017-01-11 DIAGNOSIS — I2723 Pulmonary hypertension due to lung diseases and hypoxia: Secondary | ICD-10-CM | POA: Diagnosis present

## 2017-01-11 DIAGNOSIS — D496 Neoplasm of unspecified behavior of brain: Secondary | ICD-10-CM

## 2017-01-11 DIAGNOSIS — G9389 Other specified disorders of brain: Secondary | ICD-10-CM | POA: Diagnosis present

## 2017-01-11 DIAGNOSIS — Z681 Body mass index (BMI) 19 or less, adult: Secondary | ICD-10-CM

## 2017-01-11 DIAGNOSIS — E1151 Type 2 diabetes mellitus with diabetic peripheral angiopathy without gangrene: Secondary | ICD-10-CM | POA: Diagnosis not present

## 2017-01-11 DIAGNOSIS — R402411 Glasgow coma scale score 13-15, in the field [EMT or ambulance]: Secondary | ICD-10-CM | POA: Diagnosis not present

## 2017-01-11 DIAGNOSIS — K861 Other chronic pancreatitis: Secondary | ICD-10-CM | POA: Diagnosis not present

## 2017-01-11 DIAGNOSIS — C3431 Malignant neoplasm of lower lobe, right bronchus or lung: Secondary | ICD-10-CM | POA: Diagnosis not present

## 2017-01-11 DIAGNOSIS — E876 Hypokalemia: Secondary | ICD-10-CM | POA: Diagnosis present

## 2017-01-11 DIAGNOSIS — E43 Unspecified severe protein-calorie malnutrition: Secondary | ICD-10-CM | POA: Diagnosis present

## 2017-01-11 DIAGNOSIS — E871 Hypo-osmolality and hyponatremia: Secondary | ICD-10-CM | POA: Diagnosis not present

## 2017-01-11 DIAGNOSIS — Z85118 Personal history of other malignant neoplasm of bronchus and lung: Secondary | ICD-10-CM

## 2017-01-11 DIAGNOSIS — Z923 Personal history of irradiation: Secondary | ICD-10-CM

## 2017-01-11 HISTORY — DX: Unspecified chronic bronchitis: J42

## 2017-01-11 HISTORY — DX: Dependence on supplemental oxygen: Z99.81

## 2017-01-11 HISTORY — DX: Unspecified asthma, uncomplicated: J45.909

## 2017-01-11 HISTORY — DX: Malignant neoplasm of unspecified part of unspecified bronchus or lung: C34.90

## 2017-01-11 HISTORY — DX: Other chronic pain: G89.29

## 2017-01-11 HISTORY — DX: Unspecified viral hepatitis C without hepatic coma: B19.20

## 2017-01-11 HISTORY — DX: Dorsalgia, unspecified: M54.9

## 2017-01-11 HISTORY — DX: Pure hypercholesterolemia, unspecified: E78.00

## 2017-01-11 HISTORY — DX: Anxiety disorder, unspecified: F41.9

## 2017-01-11 LAB — URINALYSIS, ROUTINE W REFLEX MICROSCOPIC
BILIRUBIN URINE: NEGATIVE
Glucose, UA: NEGATIVE mg/dL
Hgb urine dipstick: NEGATIVE
Ketones, ur: NEGATIVE mg/dL
Leukocytes, UA: NEGATIVE
NITRITE: NEGATIVE
PH: 6 (ref 5.0–8.0)
Protein, ur: NEGATIVE mg/dL
SPECIFIC GRAVITY, URINE: 1.01 (ref 1.005–1.030)

## 2017-01-11 LAB — COMPREHENSIVE METABOLIC PANEL
ALK PHOS: 69 U/L (ref 38–126)
ALT: 11 U/L — AB (ref 14–54)
AST: 29 U/L (ref 15–41)
Albumin: 3.2 g/dL — ABNORMAL LOW (ref 3.5–5.0)
Anion gap: 12 (ref 5–15)
BUN: 11 mg/dL (ref 6–20)
CALCIUM: 8.8 mg/dL — AB (ref 8.9–10.3)
CHLORIDE: 96 mmol/L — AB (ref 101–111)
CO2: 29 mmol/L (ref 22–32)
CREATININE: 0.85 mg/dL (ref 0.44–1.00)
GFR calc non Af Amer: >60 mL/min (ref >60)
GLUCOSE: 62 mg/dL — AB (ref 65–99)
Potassium: 3.5 mmol/L (ref 3.5–5.1)
SODIUM: 137 mmol/L (ref 135–145)
Total Bilirubin: 1 mg/dL (ref 0.3–1.2)
Total Protein: 6.7 g/dL (ref 6.5–8.1)

## 2017-01-11 LAB — CBC
HEMATOCRIT: 51.1 % — AB (ref 36.0–46.0)
HEMOGLOBIN: 16.9 g/dL — AB (ref 12.0–15.0)
MCH: 32.1 pg (ref 26.0–34.0)
MCHC: 33.1 g/dL (ref 30.0–36.0)
MCV: 97.1 fL (ref 78.0–100.0)
Platelets: 134 10*3/uL — ABNORMAL LOW (ref 150–400)
RBC: 5.26 MIL/uL — AB (ref 3.87–5.11)
RDW: 13.9 % (ref 11.5–15.5)
WBC: 6.6 10*3/uL (ref 4.0–10.5)

## 2017-01-11 LAB — RAPID URINE DRUG SCREEN, HOSP PERFORMED
AMPHETAMINES: NOT DETECTED
Barbiturates: NOT DETECTED
Benzodiazepines: POSITIVE — AB
COCAINE: NOT DETECTED
OPIATES: POSITIVE — AB
Tetrahydrocannabinol: NOT DETECTED

## 2017-01-11 LAB — AMMONIA: AMMONIA: 34 umol/L (ref 9–35)

## 2017-01-11 LAB — ETHANOL: Alcohol, Ethyl (B): <5 mg/dL (ref <5)

## 2017-01-11 LAB — GLUCOSE, CAPILLARY
GLUCOSE-CAPILLARY: 178 mg/dL — AB (ref 65–99)
Glucose-Capillary: 108 mg/dL — ABNORMAL HIGH (ref 65–99)

## 2017-01-11 LAB — CBG MONITORING, ED: GLUCOSE-CAPILLARY: 119 mg/dL — AB (ref 65–99)

## 2017-01-11 LAB — MAGNESIUM: MAGNESIUM: 1.2 mg/dL — AB (ref 1.7–2.4)

## 2017-01-11 MED ORDER — INSULIN ASPART 100 UNIT/ML ~~LOC~~ SOLN
0.0000 [IU] | Freq: Every day | SUBCUTANEOUS | Status: DC
  Administered 2017-01-12 – 2017-01-13 (×2): 2 [IU] via SUBCUTANEOUS

## 2017-01-11 MED ORDER — ATORVASTATIN CALCIUM 40 MG PO TABS
40.0000 mg | ORAL_TABLET | Freq: Every day | ORAL | Status: DC
  Administered 2017-01-11 – 2017-01-13 (×3): 40 mg via ORAL
  Filled 2017-01-11 (×3): qty 1

## 2017-01-11 MED ORDER — INSULIN ASPART 100 UNIT/ML ~~LOC~~ SOLN
0.0000 [IU] | Freq: Three times a day (TID) | SUBCUTANEOUS | Status: DC
  Administered 2017-01-12: 1 [IU] via SUBCUTANEOUS
  Administered 2017-01-12 – 2017-01-13 (×3): 2 [IU] via SUBCUTANEOUS
  Administered 2017-01-13: 3 [IU] via SUBCUTANEOUS
  Administered 2017-01-13 – 2017-01-14 (×2): 1 [IU] via SUBCUTANEOUS

## 2017-01-11 MED ORDER — GLUCERNA SHAKE PO LIQD
237.0000 mL | Freq: Three times a day (TID) | ORAL | Status: DC
  Filled 2017-01-11 (×7): qty 237

## 2017-01-11 MED ORDER — ALPRAZOLAM 0.5 MG PO TABS
0.5000 mg | ORAL_TABLET | Freq: Three times a day (TID) | ORAL | Status: DC | PRN
  Administered 2017-01-11 – 2017-01-14 (×5): 0.5 mg via ORAL
  Filled 2017-01-11 (×5): qty 1

## 2017-01-11 MED ORDER — GADOBENATE DIMEGLUMINE 529 MG/ML IV SOLN
10.0000 mL | Freq: Once | INTRAVENOUS | Status: AC
  Administered 2017-01-11: 10 mL via INTRAVENOUS

## 2017-01-11 MED ORDER — SODIUM CHLORIDE 0.9% FLUSH
3.0000 mL | Freq: Two times a day (BID) | INTRAVENOUS | Status: DC
  Administered 2017-01-11 – 2017-01-14 (×4): 3 mL via INTRAVENOUS

## 2017-01-11 MED ORDER — ALBUTEROL SULFATE (2.5 MG/3ML) 0.083% IN NEBU
3.0000 mL | INHALATION_SOLUTION | Freq: Four times a day (QID) | RESPIRATORY_TRACT | Status: DC | PRN
Start: 2017-01-11 — End: 2017-01-14

## 2017-01-11 MED ORDER — DEXAMETHASONE SODIUM PHOSPHATE 10 MG/ML IJ SOLN
10.0000 mg | Freq: Once | INTRAMUSCULAR | Status: AC
  Administered 2017-01-11: 10 mg via INTRAVENOUS
  Filled 2017-01-11: qty 1

## 2017-01-11 MED ORDER — ACETAMINOPHEN 650 MG RE SUPP
650.0000 mg | Freq: Four times a day (QID) | RECTAL | Status: DC | PRN
Start: 2017-01-11 — End: 2017-01-14

## 2017-01-11 MED ORDER — LABETALOL HCL 5 MG/ML IV SOLN
20.0000 mg | Freq: Once | INTRAVENOUS | Status: AC
  Administered 2017-01-11: 20 mg via INTRAVENOUS
  Filled 2017-01-11: qty 4

## 2017-01-11 MED ORDER — CARVEDILOL 6.25 MG PO TABS
6.2500 mg | ORAL_TABLET | Freq: Two times a day (BID) | ORAL | Status: DC
  Administered 2017-01-11 – 2017-01-14 (×6): 6.25 mg via ORAL
  Filled 2017-01-11 (×6): qty 1

## 2017-01-11 MED ORDER — DEXAMETHASONE SODIUM PHOSPHATE 4 MG/ML IJ SOLN
6.0000 mg | Freq: Four times a day (QID) | INTRAMUSCULAR | Status: DC
  Administered 2017-01-11 – 2017-01-14 (×11): 6 mg via INTRAVENOUS
  Filled 2017-01-11 (×11): qty 2

## 2017-01-11 MED ORDER — ACETAMINOPHEN 325 MG PO TABS
650.0000 mg | ORAL_TABLET | Freq: Four times a day (QID) | ORAL | Status: DC | PRN
  Administered 2017-01-11 – 2017-01-13 (×4): 650 mg via ORAL
  Filled 2017-01-11 (×4): qty 2

## 2017-01-11 MED ORDER — OXYCODONE HCL 5 MG PO TABS
15.0000 mg | ORAL_TABLET | Freq: Four times a day (QID) | ORAL | Status: DC | PRN
  Administered 2017-01-11 – 2017-01-14 (×11): 15 mg via ORAL
  Filled 2017-01-11 (×11): qty 3

## 2017-01-11 MED ORDER — ASPIRIN EC 81 MG PO TBEC
81.0000 mg | DELAYED_RELEASE_TABLET | Freq: Every day | ORAL | Status: DC
  Administered 2017-01-11 – 2017-01-13 (×3): 81 mg via ORAL
  Filled 2017-01-11 (×3): qty 1

## 2017-01-11 MED ORDER — ENSURE ENLIVE PO LIQD
237.0000 mL | Freq: Two times a day (BID) | ORAL | Status: DC
  Filled 2017-01-11 (×4): qty 237

## 2017-01-11 MED ORDER — GABAPENTIN 300 MG PO CAPS
300.0000 mg | ORAL_CAPSULE | Freq: Two times a day (BID) | ORAL | Status: DC
  Administered 2017-01-11 – 2017-01-14 (×6): 300 mg via ORAL
  Filled 2017-01-11 (×6): qty 1

## 2017-01-11 MED ORDER — MORPHINE SULFATE (PF) 4 MG/ML IV SOLN
4.0000 mg | Freq: Once | INTRAVENOUS | Status: AC
  Administered 2017-01-11: 4 mg via INTRAVENOUS
  Filled 2017-01-11: qty 1

## 2017-01-11 MED ORDER — LORAZEPAM 1 MG PO TABS
1.0000 mg | ORAL_TABLET | Freq: Once | ORAL | Status: AC
  Administered 2017-01-11: 1 mg via ORAL
  Filled 2017-01-11: qty 1
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
## 2017-01-11 NOTE — ED Provider Notes (Addendum)
7:72 AM Care assumed from Dr. Venora Maples. At time of transfer of care, patient is awaiting further workup of possible intracranial mass as cause of her altered mental status and bizarre behavior. According to report, patient does not have any focal neurologic deficits.   Patient is going to have MRI, chest x-ray, and further blood work to assess [employer].  Given her bizarre behavior, anticipating admission for further management once her abnormality is more cleanly defined.  While awaiting MRI, patient became hypertensive. Blood pressure was over 412 systolic. In the setting of possible intracranial  hemorrhage versus malignancy, patient given labetalol to improve this. Blood pressure improved. Patient continued to deny any headaches.  MRI showed evidence of 3 cm mass with surrounding edema but no mass effect. Possible punctate hemorrhage versus calcifications.  Neurosurgery called who will see the patient. They recommended admission to medicine for further management. Patient admitted to medicine for further management. Patient admitted in stable condition.    CRITICAL CARE Performed by: Gwenyth Allegra Tegeler Total critical care time: 30 minutes Critical care time was exclusive of separately billable procedures and treating other patients. Intracranial hemorrhage with rising blood pressures requiring IV Blood pressure medications and neurosurgical consultation. Critical care was necessary to treat or prevent imminent or life-threatening deterioration. Critical care was time spent personally by me on the following activities: development of treatment plan with patient and/or surrogate as well as nursing, discussions with consultants, evaluation of patient's response to treatment, examination of patient, obtaining history from patient or surrogate, ordering and performing treatments and interventions, ordering and review of laboratory studies, ordering and review of radiographic studies, pulse  oximetry and re-evaluation of patient's condition.    Clinical Impression: 1. Intracranial bleed ([place])     Disposition: Admit to hospitalist internal medicine service.     Courtney Paris, MD 01/11/17 1909    Gwenyth Allegra Tegeler, MD 01/19/17 [phone]
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
## 2017-01-11 NOTE — ED Notes (Signed)
Family at bedside.
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
## 2017-01-11 NOTE — Telephone Encounter (Signed)
rx faxed to [facility].

## 2017-01-12 DIAGNOSIS — K861 Other chronic pancreatitis: Secondary | ICD-10-CM | POA: Diagnosis present

## 2017-01-12 DIAGNOSIS — J439 Emphysema, unspecified: Secondary | ICD-10-CM | POA: Diagnosis present

## 2017-01-12 DIAGNOSIS — G939 Disorder of brain, unspecified: Secondary | ICD-10-CM

## 2017-01-12 DIAGNOSIS — Z7982 Long term (current) use of aspirin: Secondary | ICD-10-CM | POA: Diagnosis not present

## 2017-01-12 DIAGNOSIS — C3432 Malignant neoplasm of lower lobe, left bronchus or lung: Secondary | ICD-10-CM | POA: Diagnosis not present

## 2017-01-12 DIAGNOSIS — E1151 Type 2 diabetes mellitus with diabetic peripheral angiopathy without gangrene: Secondary | ICD-10-CM | POA: Diagnosis present

## 2017-01-12 DIAGNOSIS — E785 Hyperlipidemia, unspecified: Secondary | ICD-10-CM | POA: Diagnosis not present

## 2017-01-12 DIAGNOSIS — E871 Hypo-osmolality and hyponatremia: Secondary | ICD-10-CM | POA: Diagnosis present

## 2017-01-12 DIAGNOSIS — Z794 Long term (current) use of insulin: Secondary | ICD-10-CM | POA: Diagnosis not present

## 2017-01-12 DIAGNOSIS — C7931 Secondary malignant neoplasm of brain: Secondary | ICD-10-CM | POA: Diagnosis present

## 2017-01-12 DIAGNOSIS — Z886 Allergy status to analgesic agent status: Secondary | ICD-10-CM | POA: Diagnosis not present

## 2017-01-12 DIAGNOSIS — D496 Neoplasm of unspecified behavior of brain: Secondary | ICD-10-CM | POA: Diagnosis not present

## 2017-01-12 DIAGNOSIS — Z681 Body mass index (BMI) 19 or less, adult: Secondary | ICD-10-CM | POA: Diagnosis not present

## 2017-01-12 DIAGNOSIS — I110 Hypertensive heart disease with heart failure: Secondary | ICD-10-CM | POA: Diagnosis present

## 2017-01-12 DIAGNOSIS — G936 Cerebral edema: Secondary | ICD-10-CM | POA: Diagnosis not present

## 2017-01-12 DIAGNOSIS — Z825 Family history of asthma and other chronic lower respiratory diseases: Secondary | ICD-10-CM | POA: Diagnosis not present

## 2017-01-12 DIAGNOSIS — E039 Hypothyroidism, unspecified: Secondary | ICD-10-CM | POA: Diagnosis present

## 2017-01-12 DIAGNOSIS — E43 Unspecified severe protein-calorie malnutrition: Secondary | ICD-10-CM | POA: Diagnosis present

## 2017-01-12 DIAGNOSIS — Z8249 Family history of ischemic heart disease and other diseases of the circulatory system: Secondary | ICD-10-CM | POA: Diagnosis not present

## 2017-01-12 DIAGNOSIS — I629 Nontraumatic intracranial hemorrhage, unspecified: Secondary | ICD-10-CM | POA: Diagnosis present

## 2017-01-12 DIAGNOSIS — E118 Type 2 diabetes mellitus with unspecified complications: Secondary | ICD-10-CM | POA: Diagnosis not present

## 2017-01-12 DIAGNOSIS — Z9049 Acquired absence of other specified parts of digestive tract: Secondary | ICD-10-CM | POA: Diagnosis not present

## 2017-01-12 DIAGNOSIS — C3431 Malignant neoplasm of lower lobe, right bronchus or lung: Secondary | ICD-10-CM | POA: Diagnosis not present

## 2017-01-12 DIAGNOSIS — I272 Pulmonary hypertension, unspecified: Secondary | ICD-10-CM | POA: Diagnosis present

## 2017-01-12 DIAGNOSIS — I5032 Chronic diastolic (congestive) heart failure: Secondary | ICD-10-CM | POA: Diagnosis not present

## 2017-01-12 DIAGNOSIS — K219 Gastro-esophageal reflux disease without esophagitis: Secondary | ICD-10-CM | POA: Diagnosis present

## 2017-01-12 DIAGNOSIS — Z841 Family history of disorders of kidney and ureter: Secondary | ICD-10-CM | POA: Diagnosis not present

## 2017-01-12 DIAGNOSIS — M810 Age-related osteoporosis without current pathological fracture: Secondary | ICD-10-CM | POA: Diagnosis present

## 2017-01-12 DIAGNOSIS — Z87891 Personal history of nicotine dependence: Secondary | ICD-10-CM | POA: Diagnosis not present

## 2017-01-12 DIAGNOSIS — I2510 Atherosclerotic heart disease of native coronary artery without angina pectoris: Secondary | ICD-10-CM | POA: Diagnosis present

## 2017-01-12 LAB — GLUCOSE, CAPILLARY
GLUCOSE-CAPILLARY: 217 mg/dL — AB (ref 65–99)
Glucose-Capillary: 192 mg/dL — ABNORMAL HIGH (ref 65–99)
Glucose-Capillary: 175 mg/dL — ABNORMAL HIGH (ref 65–99)
Glucose-Capillary: 143 mg/dL — ABNORMAL HIGH (ref 65–99)

## 2017-01-12 LAB — BASIC METABOLIC PANEL
ANION GAP: 12 (ref 5–15)
BUN: 14 mg/dL (ref 6–20)
CO2: 28 mmol/L (ref 22–32)
Calcium: 8.4 mg/dL — ABNORMAL LOW (ref 8.9–10.3)
Chloride: 93 mmol/L — ABNORMAL LOW (ref 101–111)
Creatinine, Ser: 0.97 mg/dL (ref 0.44–1.00)
GLUCOSE: 172 mg/dL — AB (ref 65–99)
Potassium: 3.1 mmol/L — ABNORMAL LOW (ref 3.5–5.1)
Sodium: 133 mmol/L — ABNORMAL LOW (ref 135–145)

## 2017-01-12 LAB — HEMOGLOBIN A1C
Hgb A1c MFr Bld: 6.2 % — ABNORMAL HIGH (ref 4.8–5.6)
Mean Plasma Glucose: 131 mg/dL

## 2017-01-12 MED ORDER — FAMOTIDINE 10 MG PO TABS
10.0000 mg | ORAL_TABLET | Freq: Every day | ORAL | Status: DC
  Administered 2017-01-12 – 2017-01-14 (×3): 10 mg via ORAL
  Filled 2017-01-12 (×3): qty 1

## 2017-01-12 MED ORDER — POTASSIUM CHLORIDE CRYS ER 20 MEQ PO TBCR
40.0000 meq | EXTENDED_RELEASE_TABLET | Freq: Two times a day (BID) | ORAL | Status: AC
  Administered 2017-01-12 – 2017-01-14 (×5): 40 meq via ORAL
  Filled 2017-01-12 (×5): qty 2

## 2017-01-12 MED ORDER — MAGNESIUM SULFATE 2 GM/50ML IV SOLN
2.0000 g | Freq: Once | INTRAVENOUS | Status: AC
  Administered 2017-01-12: 2 g via INTRAVENOUS
  Filled 2017-01-12: qty 50

## 2017-01-12 MED ORDER — BENEPROTEIN PO POWD
1.0000 | Freq: Three times a day (TID) | ORAL | Status: DC
  Administered 2017-01-14: 6 g via ORAL
  Filled 2017-01-12: qty 227

## 2017-01-12 MED ORDER — RESOURCE INSTANT PROTEIN PO PWD PACKET
1.0000 | Freq: Three times a day (TID) | ORAL | Status: DC
  Filled 2017-01-12: qty 6

## 2017-01-12 NOTE — Progress Notes (Signed)
PT Cancellation Note  Patient Details Name: Phyllis [name] MRN: [id number] DOB: [birth year]   Cancelled Treatment:    Reason Eval/Treat Not Completed: Pain limiting ability to participate Pt refusing to participate secondary to pain. Requesting pain meds. Notified RN; will reattempt as schedule allows.   Nicky Pugh, PT, DPT  Acute Rehabilitation Services  Pager: [phone]  Army Melia 01/12/2017, 9:54 AM
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
## 2017-01-12 NOTE — Progress Notes (Signed)
OT Cancellation Note  Patient Details Name: Phyllis [name] MRN: [id number] DOB: 08/13/1956   Cancelled Treatment:    Reason Eval/Treat Not Completed: Pain limiting ability to participate. Pt reporting intense pain and declined all mobility and ADL at this time. RN notified that pt requesting pain meds and will check back as able for OT evaluation.   Norman Herrlich, MS OTR/L  Pager: [phone]   Norman Herrlich 01/12/2017, 9:55 AM

## 2017-01-12 NOTE — Progress Notes (Signed)
Initial Nutrition Assessment  DOCUMENTATION CODES:   Non-severe (moderate) malnutrition in context of chronic illness, Underweight  INTERVENTION:  Provide snacks TID Encourage PO intake Provide 1 packet of Beneprotein powder TID with meals, each packet provides 25 kcal and 6 grams of protein   NUTRITION DIAGNOSIS:   Malnutrition related to chronic illness, cancer and cancer related treatments as evidenced by per patient/family report, energy intake < or equal to 75% for > or equal to 1 month, moderate depletion of body fat, moderate depletions of muscle mass, percent weight loss.   GOAL:   Patient will meet greater than or equal to 90% of their needs   MONITOR:   PO intake, Weight trends, Labs, I & O's, Skin  REASON FOR ASSESSMENT:   Malnutrition Screening Tool    ASSESSMENT:   61 y.o. female with medical history significant for diabetes type 2 not on medications, COPD and pulmonary hypertension, chronic diastolic heart failure, known prolonged QT interval, dyslipidemia, hypothyroidism and prior tobacco use. Patient also has a history of NSCLC/squamous cell with bilateral pulmonary nodules treated with stereotactic radiotherapy during the fall of 2017. 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   Pt states that for the past 3 weeks she has had no appetite and has only been eating small amounts of applesauce. She states that she has never had much of an appetite and has been told that she "eats like a bird", but her appetite has gradually been decreasing over the past few months. Weight history shows 20% weight loss within the past 5 months. Pt states that she used to weigh between 135 lbs and 140 lbs.  Pt had untouched bottle of Ensure at bedside. Pt states that she does not like any nutritional supplements. RD discussed the importance of nutrition and encouraged eating often. Pt agreeable to receiving snacks while  admitted.  Pt has moderate muscle wasting of temples, clavicles, scapulas, and thighs and moderate fat wasting of ribs and arms.  Labs: low sodium, elevated glucose, low chloride, low calcium  Diet Order:  Diet regular Room service appropriate? Yes; Fluid consistency: Thin  Skin:  Reviewed, no issues (abrasions on right leg)  Last BM:  4/9  Height:   Ht Readings from Last 1 Encounters:  01/05/17 _0  (1.676 m)    Weight:   Wt Readings from Last 1 Encounters:  01/12/17 113 lb 15.7 oz (51.7 kg)    Ideal Body Weight:  59.09 kg  BMI:  Body mass index is 18.4 kg/m.  Estimated Nutritional Needs:   Kcal:  1800-2000  Protein:  72-82 grams  Fluid:  1.6-1.8 L/day  EDUCATION NEEDS:   No education needs identified at this time  Scarlette Ar RD, LDN, CSP Inpatient Clinical Dietitian Pager: [phone] After Hours Pager: [phone]

## 2017-01-12 NOTE — Progress Notes (Signed)
Stopped by to visit w/ pt. She immediately said a chaplain was the last person she wanted to talk with now, and, when I started to back out politely, she said she'd thought she was going home, but she'd just been told she had a brain tumor and was in denial and was scared. So she didn't want to talk to a chaplain now. I told her I understood what she was saying, I was so sorry, and, if she liked, I could stop back tomorrow. She said yes, she might feel like talking to me then. Chaplain informed nurse and will f/u.   01/12/17 1600  Clinical Encounter Type  Visited With Patient;Health care provider  Visit Type Initial;Psychological support  Referral From Chaplain  Spiritual Encounters  Spiritual Needs Emotional  Stress Factors  Patient Stress Factors Health changes;Loss of control   Gerrit Heck, Chaplain
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
## 2017-01-13 ENCOUNTER — Inpatient Hospital Stay (HOSPITAL_COMMUNITY): Payer: Medicare Other

## 2017-01-13 ENCOUNTER — Other Ambulatory Visit: Payer: Self-pay | Admitting: Neurosurgery

## 2017-01-13 ENCOUNTER — Encounter (HOSPITAL_COMMUNITY): Payer: Self-pay | Admitting: Radiology

## 2017-01-13 DIAGNOSIS — E118 Type 2 diabetes mellitus with unspecified complications: Secondary | ICD-10-CM

## 2017-01-13 DIAGNOSIS — Z794 Long term (current) use of insulin: Secondary | ICD-10-CM

## 2017-01-13 DIAGNOSIS — C3431 Malignant neoplasm of lower lobe, right bronchus or lung: Secondary | ICD-10-CM

## 2017-01-13 LAB — BASIC METABOLIC PANEL
Anion gap: 8 (ref 5–15)
BUN: 22 mg/dL — ABNORMAL HIGH (ref 6–20)
CHLORIDE: 94 mmol/L — AB (ref 101–111)
CO2: 27 mmol/L (ref 22–32)
Calcium: 8.7 mg/dL — ABNORMAL LOW (ref 8.9–10.3)
Creatinine, Ser: 1.08 mg/dL — ABNORMAL HIGH (ref 0.44–1.00)
GFR calc Af Amer: >60 mL/min (ref >60)
GFR calc non Af Amer: 54 mL/min — ABNORMAL LOW (ref >60)
GLUCOSE: 223 mg/dL — AB (ref 65–99)
POTASSIUM: 4.2 mmol/L (ref 3.5–5.1)
Sodium: 129 mmol/L — ABNORMAL LOW (ref 135–145)

## 2017-01-13 LAB — GLUCOSE, CAPILLARY
GLUCOSE-CAPILLARY: 121 mg/dL — AB (ref 65–99)
Glucose-Capillary: 184 mg/dL — ABNORMAL HIGH (ref 65–99)
Glucose-Capillary: 235 mg/dL — ABNORMAL HIGH (ref 65–99)
Glucose-Capillary: 208 mg/dL — ABNORMAL HIGH (ref 65–99)

## 2017-01-13 LAB — MAGNESIUM: Magnesium: 1.8 mg/dL (ref 1.7–2.4)

## 2017-01-13 MED ORDER — IOPAMIDOL (ISOVUE-300) INJECTION 61%
INTRAVENOUS | Status: AC
  Administered 2017-01-13: 50 mL
  Filled 2017-01-13: qty 50

## 2017-01-13 NOTE — Progress Notes (Signed)
No issues overnight.   EXAM:  BP 133/70 (BP Location: Right Arm)   Pulse 64   Temp 97.4 F (36.3 C) (Oral)   Resp 18   Wt 51.7 kg (113 lb 15.7 oz)   LMP 10/05/1999   SpO2 100%   BMI 18.40 kg/m   Awake, alert, oriented  Speech fluent, appropriate  CN grossly intact  5/5 BUE/BLE   IMPRESSION:  61 y.o. female with hx of lung CA and new 3cm left parietal lesion involving corpus callosum. Unclear if this is lung met or primary brain tumor. Given location, resection I don't think is feasible as it involves a significant portion of the splenium. I therefore think a biopsy is reasonable to establish a diagnosis and proceed with appropriate treatment after that.  PLAN: - Will order stereotactic CT with contrast for use during biopsy - Biopsy is scheduled next Wed, 01/19/17. I think it is reasonable to discharge the patient and have her come in electively for biopsy next week. - Would cont decadron
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
## 2017-01-13 NOTE — Progress Notes (Signed)
Stopped back by to visit w/ pt, but she was asleep. Will try again.   01/13/17 1500  Clinical Encounter Type  Visited With Patient not available  Visit Type Follow-up  Referral From Duck Virginie Josten, Chaplain

## 2017-01-13 NOTE — Care Management Note (Addendum)
Case Management Note  Patient Details  Name: Phyllis [name] MRN: [id number] Date of Birth: [birth year]  Subjective/Objective:  Pt admitted with a brain mass. She is from home with her husband.                   Action/Plan: PT/OT recommending [facility] services. CM spoke to patients husband and he is in agreement with home with [facility]. CM went over the list of [facility] agencies and he selected [facility]. Awaiting physician orders. CM continuing to follow.  4/13 Addendum Phyllis Collet RN CM patient now declining [facility] services. No other DC needs identified.  Expected Discharge Date:                  Expected Discharge Plan:  [place]  In-House Referral:     Discharge planning Services  CM Consult  Post Acute Care Choice:  Home Health Choice offered to:     DME Arranged:    DME Agency:     HH Arranged:    [place] Agency:  [place]  Status of Service:  In process, will continue to follow  If discussed at Long Length of Stay Meetings, dates discussed:    Additional Comments:  Pollie Friar, RN 01/13/2017, 1:57 PM

## 2017-01-14 DIAGNOSIS — I5032 Chronic diastolic (congestive) heart failure: Secondary | ICD-10-CM

## 2017-01-14 LAB — GLUCOSE, CAPILLARY: GLUCOSE-CAPILLARY: 148 mg/dL — AB (ref 65–99)

## 2017-01-14 MED ORDER — DEXAMETHASONE 4 MG PO TABS: 4.0000 mg | ORAL_TABLET | Freq: Three times a day (TID) | ORAL | 0 refills | Status: DC

## 2017-01-14 MED ORDER — FAMOTIDINE 20 MG PO TABS: 20.0000 mg | ORAL_TABLET | Freq: Every day | ORAL | 0 refills | Status: DC

## 2017-01-14 MED ORDER — HYDRALAZINE HCL 25 MG PO TABS: 25.0000 mg | ORAL_TABLET | Freq: Two times a day (BID) | ORAL | 0 refills | Status: DC

## 2017-01-14 MED ORDER — DEXAMETHASONE SODIUM PHOSPHATE 4 MG/ML IJ SOLN: 4.0000 mg | Freq: Three times a day (TID) | INTRAMUSCULAR | Status: DC

## 2017-01-14 NOTE — Consult Note (Addendum)
   [facility] CM Inpatient Consult   01/14/2017  CAMARYN LUMBERT [birth year] [id number]   Patient assessed for post hospital follow up needs for re-admission.  Noted patient had been recommended for HHPT but declined.  Came by to speak with the patient.  Patient was not in the room.  Noted patient has discharge orders to discharge home.  Natividad Brood, RN BSN [facility] Liaison  [phone] business mobile phone Toll free office [phone]

## 2017-01-14 NOTE — Progress Notes (Signed)
Pt discharged home with husband. Discharge instructions were reviewed with the pt. PT stated " I dont care I want to go home" . Rn tried again to review discharge and pt was not paying any attention kept stating " I know , I know".

## 2017-01-14 NOTE — Progress Notes (Signed)
   01/14/17 1040  Clinical Encounter Type  Visited With Patient not available  Visit Type Follow-up  Spiritual Encounters  Spiritual Needs Emotional  Stress Factors  Patient Stress Factors Not reviewed  Pt not in room. Follow up as needed.
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
## 2017-01-18 ENCOUNTER — Encounter (HOSPITAL_COMMUNITY): Payer: Self-pay | Admitting: *Deleted

## 2017-01-18 ENCOUNTER — Institutional Professional Consult (permissible substitution): Payer: Self-pay | Admitting: Radiation Oncology

## 2017-01-19 ENCOUNTER — Encounter (HOSPITAL_COMMUNITY)
Admission: RE | Disposition: A | Discharge status: Home / Self Care | Payer: Self-pay | Source: Ambulatory Visit | Attending: Neurosurgery

## 2017-01-19 ENCOUNTER — Inpatient Hospital Stay (HOSPITAL_COMMUNITY): Payer: Medicare Other | Admitting: Certified Registered Nurse Anesthetist

## 2017-01-19 ENCOUNTER — Encounter (HOSPITAL_COMMUNITY): Payer: Self-pay | Admitting: *Deleted

## 2017-01-19 ENCOUNTER — Inpatient Hospital Stay (HOSPITAL_COMMUNITY)
Admission: RE | Admit: 2017-01-19 | Discharge: 2017-01-20 | DRG: 055 | Disposition: A | Discharge status: Home / Self Care | Payer: Medicare Other | Source: Ambulatory Visit | Attending: Neurosurgery | Admitting: Neurosurgery

## 2017-01-19 DIAGNOSIS — E039 Hypothyroidism, unspecified: Secondary | ICD-10-CM | POA: Diagnosis not present

## 2017-01-19 DIAGNOSIS — J439 Emphysema, unspecified: Secondary | ICD-10-CM | POA: Diagnosis not present

## 2017-01-19 DIAGNOSIS — Z923 Personal history of irradiation: Secondary | ICD-10-CM

## 2017-01-19 DIAGNOSIS — Z9049 Acquired absence of other specified parts of digestive tract: Secondary | ICD-10-CM

## 2017-01-19 DIAGNOSIS — Z79899 Other long term (current) drug therapy: Secondary | ICD-10-CM | POA: Diagnosis not present

## 2017-01-19 DIAGNOSIS — Z886 Allergy status to analgesic agent status: Secondary | ICD-10-CM | POA: Diagnosis not present

## 2017-01-19 DIAGNOSIS — B1920 Unspecified viral hepatitis C without hepatic coma: Secondary | ICD-10-CM | POA: Diagnosis present

## 2017-01-19 DIAGNOSIS — K219 Gastro-esophageal reflux disease without esophagitis: Secondary | ICD-10-CM | POA: Diagnosis not present

## 2017-01-19 DIAGNOSIS — Z888 Allergy status to other drugs, medicaments and biological substances status: Secondary | ICD-10-CM

## 2017-01-19 DIAGNOSIS — E1142 Type 2 diabetes mellitus with diabetic polyneuropathy: Secondary | ICD-10-CM | POA: Diagnosis not present

## 2017-01-19 DIAGNOSIS — M810 Age-related osteoporosis without current pathological fracture: Secondary | ICD-10-CM | POA: Diagnosis not present

## 2017-01-19 DIAGNOSIS — Z85118 Personal history of other malignant neoplasm of bronchus and lung: Secondary | ICD-10-CM

## 2017-01-19 DIAGNOSIS — Z7982 Long term (current) use of aspirin: Secondary | ICD-10-CM | POA: Diagnosis not present

## 2017-01-19 DIAGNOSIS — D496 Neoplasm of unspecified behavior of brain: Principal | ICD-10-CM | POA: Diagnosis present

## 2017-01-19 DIAGNOSIS — Z87891 Personal history of nicotine dependence: Secondary | ICD-10-CM | POA: Diagnosis not present

## 2017-01-19 DIAGNOSIS — C3490 Malignant neoplasm of unspecified part of unspecified bronchus or lung: Secondary | ICD-10-CM | POA: Diagnosis not present

## 2017-01-19 DIAGNOSIS — I110 Hypertensive heart disease with heart failure: Secondary | ICD-10-CM | POA: Diagnosis present

## 2017-01-19 DIAGNOSIS — Z85828 Personal history of other malignant neoplasm of skin: Secondary | ICD-10-CM | POA: Diagnosis not present

## 2017-01-19 DIAGNOSIS — I509 Heart failure, unspecified: Secondary | ICD-10-CM | POA: Diagnosis present

## 2017-01-19 DIAGNOSIS — I10 Essential (primary) hypertension: Secondary | ICD-10-CM | POA: Diagnosis not present

## 2017-01-19 HISTORY — PX: APPLICATION OF CRANIAL NAVIGATION: SHX6578

## 2017-01-19 HISTORY — DX: Prediabetes: R73.03

## 2017-01-19 HISTORY — PX: PR DURAL GRAFT REPAIR,SPINE DEFECT: 63710

## 2017-01-19 HISTORY — DX: Heart failure, unspecified: I50.9

## 2017-01-19 LAB — COMPREHENSIVE METABOLIC PANEL
ALT: 34 U/L (ref 14–54)
ANION GAP: 10 (ref 5–15)
AST: 43 U/L — ABNORMAL HIGH (ref 15–41)
Albumin: 3.5 g/dL (ref 3.5–5.0)
Alkaline Phosphatase: 56 U/L (ref 38–126)
BILIRUBIN TOTAL: 0.7 mg/dL (ref 0.3–1.2)
BUN: 10 mg/dL (ref 6–20)
CO2: 28 mmol/L (ref 22–32)
Calcium: 8.8 mg/dL — ABNORMAL LOW (ref 8.9–10.3)
Chloride: 86 mmol/L — ABNORMAL LOW (ref 101–111)
Creatinine, Ser: 0.76 mg/dL (ref 0.44–1.00)
Glucose, Bld: 117 mg/dL — ABNORMAL HIGH (ref 65–99)
POTASSIUM: 5 mmol/L (ref 3.5–5.1)
Sodium: 124 mmol/L — ABNORMAL LOW (ref 135–145)
Total Protein: 6 g/dL — ABNORMAL LOW (ref 6.5–8.1)

## 2017-01-19 LAB — CBC
HCT: 41.6 % (ref 36.0–46.0)
HEMATOCRIT: 46.4 % — AB (ref 36.0–46.0)
Hemoglobin: 15.8 g/dL — ABNORMAL HIGH (ref 12.0–15.0)
Hemoglobin: 14.2 g/dL (ref 12.0–15.0)
MCH: 32.1 pg (ref 26.0–34.0)
MCH: 31.8 pg (ref 26.0–34.0)
MCHC: 34.1 g/dL (ref 30.0–36.0)
MCHC: 34.1 g/dL (ref 30.0–36.0)
MCV: 94.3 fL (ref 78.0–100.0)
MCV: 93.3 fL (ref 78.0–100.0)
PLATELETS: 229 10*3/uL (ref 150–400)
Platelets: 170 10*3/uL (ref 150–400)
RBC: 4.92 MIL/uL (ref 3.87–5.11)
RBC: 4.46 MIL/uL (ref 3.87–5.11)
RDW: 13 % (ref 11.5–15.5)
RDW: 12.9 % (ref 11.5–15.5)
WBC: 9.7 10*3/uL (ref 4.0–10.5)
WBC: 6.9 10*3/uL (ref 4.0–10.5)

## 2017-01-19 LAB — BASIC METABOLIC PANEL
Anion gap: 5 (ref 5–15)
BUN: 9 mg/dL (ref 6–20)
CO2: 26 mmol/L (ref 22–32)
Calcium: 7.6 mg/dL — ABNORMAL LOW (ref 8.9–10.3)
Chloride: 94 mmol/L — ABNORMAL LOW (ref 101–111)
Creatinine, Ser: 0.63 mg/dL (ref 0.44–1.00)
GFR calc Af Amer: >60 mL/min (ref >60)
GFR calc non Af Amer: >60 mL/min (ref >60)
Glucose, Bld: 171 mg/dL — ABNORMAL HIGH (ref 65–99)
Potassium: 4.3 mmol/L (ref 3.5–5.1)
Sodium: 125 mmol/L — ABNORMAL LOW (ref 135–145)

## 2017-01-19 LAB — GLUCOSE, CAPILLARY
Glucose-Capillary: 153 mg/dL — ABNORMAL HIGH (ref 65–99)
Glucose-Capillary: 276 mg/dL — ABNORMAL HIGH (ref 65–99)

## 2017-01-19 LAB — TYPE AND SCREEN
ABO/RH(D): B POS
ANTIBODY SCREEN: NEGATIVE

## 2017-01-19 LAB — PROTIME-INR
INR: 1.08
PROTHROMBIN TIME: 14 s (ref 11.4–15.2)

## 2017-01-19 LAB — ABO/RH: ABO/RH(D): B POS

## 2017-01-19 LAB — MRSA PCR SCREENING: MRSA BY PCR: NEGATIVE

## 2017-01-19 SURGERY — FRAMELESS STEREOTACTIC BIOPSY
Anesthesia: General | Laterality: Left

## 2017-01-19 MED ORDER — SODIUM CHLORIDE 0.9 % IV SOLN
INTRAVENOUS | Status: DC | PRN
  Administered 2017-01-19: 17:00:00 via INTRAVENOUS

## 2017-01-19 MED ORDER — LIDOCAINE-EPINEPHRINE 1 %-1:100000 IJ SOLN
INTRAMUSCULAR | Status: AC
  Filled 2017-01-19: qty 1

## 2017-01-19 MED ORDER — ACETAMINOPHEN 325 MG PO TABS: 650.0000 mg | ORAL_TABLET | ORAL | Status: DC | PRN

## 2017-01-19 MED ORDER — IPRATROPIUM-ALBUTEROL 0.5-2.5 (3) MG/3ML IN SOLN
RESPIRATORY_TRACT | Status: AC
  Filled 2017-01-19: qty 3

## 2017-01-19 MED ORDER — POLYETHYLENE GLYCOL 3350 17 G PO PACK: 17.0000 g | PACK | Freq: Every day | ORAL | Status: DC | PRN

## 2017-01-19 MED ORDER — INSULIN ASPART 100 UNIT/ML ~~LOC~~ SOLN: 0.0000 [IU] | Freq: Three times a day (TID) | SUBCUTANEOUS | Status: DC

## 2017-01-19 MED ORDER — DOCUSATE SODIUM 100 MG PO CAPS
100.0000 mg | ORAL_CAPSULE | Freq: Two times a day (BID) | ORAL | Status: DC
  Administered 2017-01-19 – 2017-01-20 (×2): 100 mg via ORAL
  Filled 2017-01-19 (×2): qty 1

## 2017-01-19 MED ORDER — CEFAZOLIN IN D5W 1 GM/50ML IV SOLN
1.0000 g | Freq: Three times a day (TID) | INTRAVENOUS | Status: AC
  Administered 2017-01-19 – 2017-01-20 (×2): 1 g via INTRAVENOUS
  Filled 2017-01-19 (×3): qty 50

## 2017-01-19 MED ORDER — LABETALOL HCL 5 MG/ML IV SOLN
INTRAVENOUS | Status: AC
  Administered 2017-01-19: 20 mg via INTRAVENOUS
  Filled 2017-01-19: qty 8

## 2017-01-19 MED ORDER — SODIUM CHLORIDE 0.9 % IV SOLN
INTRAVENOUS | Status: DC
  Administered 2017-01-19 (×3): via INTRAVENOUS

## 2017-01-19 MED ORDER — INSULIN ASPART 100 UNIT/ML ~~LOC~~ SOLN
0.0000 [IU] | Freq: Every day | SUBCUTANEOUS | Status: DC
  Administered 2017-01-19: 3 [IU] via SUBCUTANEOUS

## 2017-01-19 MED ORDER — CEFAZOLIN SODIUM-DEXTROSE 2-4 GM/100ML-% IV SOLN
2.0000 g | INTRAVENOUS | Status: AC
  Administered 2017-01-19: 2 g via INTRAVENOUS
  Filled 2017-01-19: qty 100

## 2017-01-19 MED ORDER — BUPIVACAINE HCL (PF) 0.5 % IJ SOLN
INTRAMUSCULAR | Status: AC
  Filled 2017-01-19: qty 30

## 2017-01-19 MED ORDER — MORPHINE SULFATE (PF) 2 MG/ML IV SOLN
1.0000 mg | INTRAVENOUS | Status: DC | PRN
  Administered 2017-01-19: 1 mg via INTRAVENOUS
  Administered 2017-01-20: 2 mg via INTRAVENOUS
  Filled 2017-01-19 (×2): qty 1

## 2017-01-19 MED ORDER — ONDANSETRON HCL 4 MG/2ML IJ SOLN
INTRAMUSCULAR | Status: DC | PRN
  Administered 2017-01-19: 4 mg via INTRAVENOUS

## 2017-01-19 MED ORDER — ONDANSETRON HCL 4 MG/2ML IJ SOLN: 4.0000 mg | INTRAMUSCULAR | Status: DC | PRN

## 2017-01-19 MED ORDER — FENTANYL CITRATE (PF) 250 MCG/5ML IJ SOLN
INTRAMUSCULAR | Status: AC
  Filled 2017-01-19: qty 5

## 2017-01-19 MED ORDER — CHLORHEXIDINE GLUCONATE CLOTH 2 % EX PADS: 6.0000 | MEDICATED_PAD | Freq: Once | CUTANEOUS | Status: DC

## 2017-01-19 MED ORDER — OXYCODONE-ACETAMINOPHEN 5-325 MG PO TABS
1.0000 | ORAL_TABLET | ORAL | Status: DC | PRN
  Administered 2017-01-20 (×2): 1 via ORAL
  Filled 2017-01-19 (×2): qty 1

## 2017-01-19 MED ORDER — SUGAMMADEX SODIUM 200 MG/2ML IV SOLN
INTRAVENOUS | Status: DC | PRN
  Administered 2017-01-19: 200 mg via INTRAVENOUS

## 2017-01-19 MED ORDER — CEFAZOLIN SODIUM-DEXTROSE 2-4 GM/100ML-% IV SOLN
INTRAVENOUS | Status: AC
  Filled 2017-01-19: qty 100

## 2017-01-19 MED ORDER — DEXAMETHASONE SODIUM PHOSPHATE 10 MG/ML IJ SOLN
INTRAMUSCULAR | Status: DC | PRN
  Administered 2017-01-19: 10 mg via INTRAVENOUS

## 2017-01-19 MED ORDER — BUPIVACAINE HCL (PF) 0.5 % IJ SOLN
INTRAMUSCULAR | Status: DC | PRN
  Administered 2017-01-19: 1.5 mL

## 2017-01-19 MED ORDER — NALOXONE HCL 0.4 MG/ML IJ SOLN: 0.0800 mg | INTRAMUSCULAR | Status: DC | PRN

## 2017-01-19 MED ORDER — LABETALOL HCL 5 MG/ML IV SOLN
INTRAVENOUS | Status: AC
  Filled 2017-01-19: qty 4

## 2017-01-19 MED ORDER — SODIUM CHLORIDE 0.9 % IV SOLN
500.0000 mg | Freq: Two times a day (BID) | INTRAVENOUS | Status: DC
  Administered 2017-01-19 – 2017-01-20 (×2): 500 mg via INTRAVENOUS
  Filled 2017-01-19 (×4): qty 5

## 2017-01-19 MED ORDER — ONDANSETRON HCL 4 MG PO TABS: 4.0000 mg | ORAL_TABLET | ORAL | Status: DC | PRN

## 2017-01-19 MED ORDER — HYDROMORPHONE HCL 1 MG/ML IJ SOLN
INTRAMUSCULAR | Status: AC
  Filled 2017-01-19: qty 0.5

## 2017-01-19 MED ORDER — HEMOSTATIC AGENTS (NO CHARGE) OPTIME
TOPICAL | Status: DC | PRN
  Administered 2017-01-19: 1 via TOPICAL

## 2017-01-19 MED ORDER — MIDAZOLAM HCL 2 MG/2ML IJ SOLN
INTRAMUSCULAR | Status: AC
  Filled 2017-01-19: qty 2

## 2017-01-19 MED ORDER — THROMBIN 5000 UNITS EX SOLR
CUTANEOUS | Status: DC | PRN
  Administered 2017-01-19 (×2): 5000 [IU] via TOPICAL

## 2017-01-19 MED ORDER — PROPOFOL 10 MG/ML IV BOLUS
INTRAVENOUS | Status: DC | PRN
  Administered 2017-01-19: 200 mg via INTRAVENOUS

## 2017-01-19 MED ORDER — ROCURONIUM BROMIDE 50 MG/5ML IV SOSY
PREFILLED_SYRINGE | INTRAVENOUS | Status: AC
  Filled 2017-01-19: qty 5

## 2017-01-19 MED ORDER — BISACODYL 10 MG RE SUPP
10.0000 mg | Freq: Every day | RECTAL | Status: DC | PRN
Start: 2017-01-19 — End: 2017-01-20

## 2017-01-19 MED ORDER — LABETALOL HCL 5 MG/ML IV SOLN
10.0000 mg | INTRAVENOUS | Status: DC | PRN
  Administered 2017-01-19 (×2): 20 mg via INTRAVENOUS

## 2017-01-19 MED ORDER — SODIUM CHLORIDE 0.9 % IV SOLN
INTRAVENOUS | Status: DC
  Administered 2017-01-19: 23:00:00 via INTRAVENOUS

## 2017-01-19 MED ORDER — LIDOCAINE-EPINEPHRINE 1 %-1:100000 IJ SOLN
INTRAMUSCULAR | Status: DC | PRN
  Administered 2017-01-19: 1.5 mL

## 2017-01-19 MED ORDER — BACITRACIN ZINC 500 UNIT/GM EX OINT
TOPICAL_OINTMENT | CUTANEOUS | Status: DC | PRN
  Administered 2017-01-19: 1 via TOPICAL

## 2017-01-19 MED ORDER — ROCURONIUM BROMIDE 100 MG/10ML IV SOLN
INTRAVENOUS | Status: DC | PRN
  Administered 2017-01-19: 20 mg via INTRAVENOUS
  Administered 2017-01-19: 30 mg via INTRAVENOUS
  Administered 2017-01-19: 20 mg via INTRAVENOUS

## 2017-01-19 MED ORDER — LIDOCAINE 2% (20 MG/ML) 5 ML SYRINGE
INTRAMUSCULAR | Status: DC | PRN
  Administered 2017-01-19: 60 mg via INTRAVENOUS

## 2017-01-19 MED ORDER — SODIUM CHLORIDE 0.9 % IR SOLN
Status: DC | PRN
  Administered 2017-01-19: 18:00:00

## 2017-01-19 MED ORDER — HYDROMORPHONE HCL 1 MG/ML IJ SOLN
0.2500 mg | INTRAMUSCULAR | Status: DC | PRN
  Administered 2017-01-19 (×2): 0.5 mg via INTRAVENOUS

## 2017-01-19 MED ORDER — BACITRACIN ZINC 500 UNIT/GM EX OINT
TOPICAL_OINTMENT | CUTANEOUS | Status: AC
  Filled 2017-01-19: qty 28.35

## 2017-01-19 MED ORDER — 0.9 % SODIUM CHLORIDE (POUR BTL) OPTIME
TOPICAL | Status: DC | PRN
  Administered 2017-01-19: 1000 mL

## 2017-01-19 MED ORDER — LABETALOL HCL 5 MG/ML IV SOLN
INTRAVENOUS | Status: DC | PRN
  Administered 2017-01-19: 10 mg via INTRAVENOUS

## 2017-01-19 MED ORDER — ACETAMINOPHEN 650 MG RE SUPP: 650.0000 mg | RECTAL | Status: DC | PRN

## 2017-01-19 MED ORDER — MAGNESIUM CITRATE PO SOLN: 1.0000 | Freq: Once | ORAL | Status: DC | PRN

## 2017-01-19 MED ORDER — HYDRALAZINE HCL 20 MG/ML IJ SOLN
5.0000 mg | INTRAMUSCULAR | Status: DC | PRN
  Administered 2017-01-19: 5 mg via INTRAVENOUS
  Administered 2017-01-20: 10 mg via INTRAVENOUS
  Filled 2017-01-19: qty 1

## 2017-01-19 MED ORDER — FENTANYL CITRATE (PF) 100 MCG/2ML IJ SOLN
INTRAMUSCULAR | Status: DC | PRN
  Administered 2017-01-19 (×2): 100 ug via INTRAVENOUS

## 2017-01-19 MED ORDER — HYDROCODONE-ACETAMINOPHEN 5-325 MG PO TABS
1.0000 | ORAL_TABLET | ORAL | Status: DC | PRN
  Administered 2017-01-19 – 2017-01-20 (×2): 1 via ORAL
  Filled 2017-01-19 (×2): qty 1

## 2017-01-19 MED ORDER — THROMBIN 5000 UNITS EX SOLR
CUTANEOUS | Status: AC
  Filled 2017-01-19: qty 10000

## 2017-01-19 MED ORDER — LIDOCAINE 2% (20 MG/ML) 5 ML SYRINGE
INTRAMUSCULAR | Status: AC
  Filled 2017-01-19: qty 5

## 2017-01-19 MED ORDER — CLONIDINE HCL 0.1 MG PO TABS
0.1000 mg | ORAL_TABLET | Freq: Once | ORAL | Status: AC
  Administered 2017-01-19: 0.1 mg via ORAL
  Filled 2017-01-19: qty 1

## 2017-01-19 MED ORDER — HYDROMORPHONE HCL 1 MG/ML IJ SOLN
INTRAMUSCULAR | Status: AC
  Administered 2017-01-19: 0.5 mg via INTRAVENOUS
  Filled 2017-01-19: qty 0.5

## 2017-01-19 SURGICAL SUPPLY — 52 items
BANDAGE ADH SHEER 1  50/CT (GAUZE/BANDAGES/DRESSINGS) IMPLANT
BLADE CLIPPER SURG (BLADE) IMPLANT
CANISTER SUCT 3000ML PPV (MISCELLANEOUS) ×3 IMPLANT
CARTRIDGE OIL MAESTRO DRILL (MISCELLANEOUS) ×2 IMPLANT
DIFFUSER DRILL AIR PNEUMATIC (MISCELLANEOUS) ×6 IMPLANT
DRAPE POUCH INSTRU U-SHP 10X18 (DRAPES) ×3 IMPLANT
DRAPE PROXIMA HALF (DRAPES) ×2 IMPLANT
DRSG TELFA 3X8 NADH (GAUZE/BANDAGES/DRESSINGS) ×3 IMPLANT
DURAPREP 26ML APPLICATOR (WOUND CARE) IMPLANT
ELECT REM PT RETURN 9FT ADLT (ELECTROSURGICAL) ×3
ELECTRODE REM PT RTRN 9FT ADLT (ELECTROSURGICAL) ×1 IMPLANT
GAUZE SPONGE 4X4 16PLY XRAY LF (GAUZE/BANDAGES/DRESSINGS) ×3 IMPLANT
GLOVE BIO SURGEON STRL SZ7 (GLOVE) IMPLANT
GLOVE BIOGEL PI IND STRL 7.0 (GLOVE) IMPLANT
GLOVE BIOGEL PI IND STRL 7.5 (GLOVE) ×1 IMPLANT
GLOVE BIOGEL PI INDICATOR 7.0 (GLOVE) ×2
GLOVE BIOGEL PI INDICATOR 7.5 (GLOVE) ×4
GLOVE ECLIPSE 7.0 STRL STRAW (GLOVE) ×3 IMPLANT
GLOVE EXAM NITRILE LRG STRL (GLOVE) IMPLANT
GLOVE EXAM NITRILE XL STR (GLOVE) IMPLANT
GLOVE EXAM NITRILE XS STR PU (GLOVE) IMPLANT
GLOVE SS N UNI LF 7.5 STRL (GLOVE) ×6 IMPLANT
GOWN STRL REUS W/ TWL LRG LVL3 (GOWN DISPOSABLE) IMPLANT
GOWN STRL REUS W/ TWL XL LVL3 (GOWN DISPOSABLE) IMPLANT
GOWN STRL REUS W/TWL 2XL LVL3 (GOWN DISPOSABLE) IMPLANT
GOWN STRL REUS W/TWL LRG LVL3 (GOWN DISPOSABLE) ×6
GOWN STRL REUS W/TWL XL LVL3 (GOWN DISPOSABLE) ×3
KIT BASIN OR (CUSTOM PROCEDURE TRAY) ×3 IMPLANT
KIT NDL BIOPSY 1.8X235 CRAN (NEEDLE) ×1 IMPLANT
KIT NEEDLE BIOPSY 1.8X235 CRAN (NEEDLE) ×3 IMPLANT
KIT ROOM TURNOVER OR (KITS) ×3 IMPLANT
MARKER SPHERE PSV REFLC 13MM (MARKER) ×6 IMPLANT
NDL HYPO 25X1 1.5 SAFETY (NEEDLE) ×1 IMPLANT
NEEDLE HYPO 25X1 1.5 SAFETY (NEEDLE) ×3 IMPLANT
NS IRRIG 1000ML POUR BTL (IV SOLUTION) ×3 IMPLANT
OIL CARTRIDGE MAESTRO DRILL (MISCELLANEOUS) ×6
PACK CRANIOTOMY (CUSTOM PROCEDURE TRAY) ×3 IMPLANT
PAD ARMBOARD 7.5X6 YLW CONV (MISCELLANEOUS) ×9 IMPLANT
PAD DRESSING TELFA 3X8 NADH (GAUZE/BANDAGES/DRESSINGS) ×1 IMPLANT
PERFORATOR LRG  14-11MM (BIT) ×2
PERFORATOR LRG 14-11MM (BIT) ×1 IMPLANT
PLATE 1.5/0.5 18.5MM BURR HOLE (Plate) ×2 IMPLANT
SCREW SELF DRILL HT 1.5/4MM (Screw) ×8 IMPLANT
SPONGE SURGIFOAM ABS GEL SZ50 (HEMOSTASIS) IMPLANT
STAPLER SKIN PROX WIDE 3.9 (STAPLE) ×3 IMPLANT
STAPLER VISISTAT (STAPLE) ×2 IMPLANT
STAPLER VISISTAT 35W (STAPLE) ×2 IMPLANT
SUT VIC AB 3-0 SH 8-18 (SUTURE) ×3 IMPLANT
SYR 5ML LUER SLIP (SYRINGE) ×6 IMPLANT
TOWEL GREEN STERILE (TOWEL DISPOSABLE) ×2 IMPLANT
TOWEL GREEN STERILE FF (TOWEL DISPOSABLE) ×3 IMPLANT
WATER STERILE IRR 1000ML POUR (IV SOLUTION) ×3 IMPLANT

## 2017-01-19 NOTE — Anesthesia Procedure Notes (Signed)
Arterial Line Insertion Start/End4/18/2018 3:40 PM, 01/19/2017 3:48 PM Performed by: Suzette Battiest, anesthesiologist  Patient location: Pre-op. Preanesthetic checklist: patient identified, IV checked, site marked, risks and benefits discussed, surgical consent, monitors and equipment checked, pre-op evaluation, timeout performed and anesthesia consent Lidocaine 1% used for infiltration Left, brachial was placed Catheter size: 20 Fr Hand hygiene performed  and maximum sterile barriers used   Attempts: 1 Procedure performed using ultrasound guided technique. Ultrasound Notes:anatomy identified, needle tip was noted to be adjacent to the nerve/plexus identified and no ultrasound evidence of intravascular and/or intraneural injection Following insertion, dressing applied. Post procedure assessment: normal and unchanged  Patient tolerated the procedure well with no immediate complications.

## 2017-01-19 NOTE — Anesthesia Procedure Notes (Signed)
Procedure Name: Intubation Date/Time: 01/19/2017 5:15 PM Performed by: Trixie Deis A Pre-anesthesia Checklist: Patient identified, Emergency Drugs available, Suction available and Patient being monitored Patient Re-evaluated:Patient Re-evaluated prior to inductionOxygen Delivery Method: Circle System Utilized Preoxygenation: Pre-oxygenation with 100% oxygen Intubation Type: IV induction Ventilation: Mask ventilation without difficulty Laryngoscope Size: Mac and 3 Grade View: Grade I Tube type: Oral Tube size: 7.0 mm Number of attempts: 1 Airway Equipment and Method: Stylet and Oral airway Placement Confirmation: ETT inserted through vocal cords under direct vision,  positive ETCO2 and breath sounds checked- equal and bilateral Secured at: 21 cm Tube secured with: Tape Dental Injury: Teeth and Oropharynx as per pre-operative assessment

## 2017-01-19 NOTE — Anesthesia Preprocedure Evaluation (Addendum)
Anesthesia Evaluation  Patient identified by MRN, date of birth, ID band Patient awake    Reviewed: Allergy & Precautions, NPO status , Patient's Chart, lab work & pertinent test results, reviewed documented beta blocker date and time   Airway Mallampati: II  TM Distance: >3 FB Neck ROM: Full    Dental  (+) Dental Advisory Given   Pulmonary asthma , COPD, former smoker,    breath sounds clear to auscultation       Cardiovascular hypertension, Pt. on medications and Pt. on home beta blockers +CHF   Rhythm:Regular Rate:Normal     Neuro/Psych Anxiety Depression negative neurological ROS     GI/Hepatic GERD  ,(+) Hepatitis -, C  Endo/Other  diabetes, Type 2Hypothyroidism   Renal/GU      Musculoskeletal negative musculoskeletal ROS (+)   Abdominal   Peds  Hematology negative hematology ROS (+)   Anesthesia Other Findings   Reproductive/Obstetrics                            Lab Results  Component Value Date   WBC 9.7 01/19/2017   HGB 15.8 (H) 01/19/2017   HCT 46.4 (H) 01/19/2017   MCV 94.3 01/19/2017   PLT 229 01/19/2017   Lab Results  Component Value Date   CREATININE 1.08 (H) 01/13/2017   BUN 22 (H) 01/13/2017   NA 129 (L) 01/13/2017   K 4.2 01/13/2017   CL 94 (L) 01/13/2017   CO2 27 01/13/2017    Anesthesia Physical Anesthesia Plan  ASA: III  Anesthesia Plan: General   Post-op Pain Management:    Induction: Intravenous  Airway Management Planned: Oral ETT  Additional Equipment: Arterial line  Intra-op Plan:   Post-operative Plan: Possible Post-op intubation/ventilation  Informed Consent: I have reviewed the patients History and Physical, chart, labs and discussed the procedure including the risks, benefits and alternatives for the proposed anesthesia with the patient or authorized representative who has indicated his/her understanding and acceptance.   Dental  advisory given  Plan Discussed with: CRNA  Anesthesia Plan Comments:        Anesthesia Quick Evaluation

## 2017-01-19 NOTE — Interval H&P Note (Signed)
History and Physical Interval Note: Pts clinical condition is unchanged. I have reviewed the risks, benefits, and alternatives to the biopsy with both the patient and her husband at bedside which include but are not limited to bleeding/stroke leading to weakness/numbness/paralysis/coma/death, infection, SZ, hydrocephalus, and non-diagnostic biopsy. Expected postoperative course was reviewed. All questions were answered and consent was obtained.  01/19/2017 2:16 PM  Phyllis [name]  has presented today for surgery, with the diagnosis of BRAIN MASS  The various methods of treatment have been discussed with the patient and family. After consideration of risks, benefits and other options for treatment, the patient has consented to  Procedure(s) with comments: LEFT CRANIOTOMY FOR STERIOTACTIC BRAIN BIOPSY (Left) - LEFT CRANIOTOMY FOR [place] (Left) as a surgical intervention .  The patient's history has been reviewed, patient examined, no change in status, stable for surgery.  I have reviewed the patient's chart and labs.  Questions were answered to the patient's satisfaction.     Tnia Anglada, C

## 2017-01-19 NOTE — Transfer of Care (Signed)
Immediate Anesthesia Transfer of Care Note  Patient: Phyllis [name]  Procedure(s) Performed: Procedure(s) with comments: LEFT CRANIOTOMY FOR STERIOTACTIC BRAIN BIOPSY (Left) - LEFT CRANIOTOMY FOR STERIOTACTIC BRAIN BIOPSY APPLICATION OF CRANIAL NAVIGATION (Left)  Patient Location: PACU  Anesthesia Type:General  Level of Consciousness: awake, alert  and oriented  Airway & Oxygen Therapy: Patient Spontanous Breathing and Patient connected to nasal cannula oxygen  Post-op Assessment: Report given to RN, Post -op Vital signs reviewed and stable and Patient moving all extremities  Post vital signs: Reviewed and stable  Last Vitals:  Vitals:   01/19/17 1503  BP: (!) 196/78  Pulse: 65  Resp: 18  Temp: 36.6 C    Last Pain:  Vitals:   01/19/17 1503  TempSrc: Oral      Patients Stated Pain Goal: 2 (52/47/99 8001)  Complications: No apparent anesthesia complications
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
## 2017-01-20 ENCOUNTER — Encounter (HOSPITAL_COMMUNITY): Payer: Self-pay | Admitting: Neurosurgery

## 2017-01-20 LAB — CBC
HCT: 42.7 % (ref 36.0–46.0)
Hemoglobin: 14.5 g/dL (ref 12.0–15.0)
MCH: 31.8 pg (ref 26.0–34.0)
MCHC: 34 g/dL (ref 30.0–36.0)
MCV: 93.6 fL (ref 78.0–100.0)
PLATELETS: 185 10*3/uL (ref 150–400)
RBC: 4.56 MIL/uL (ref 3.87–5.11)
RDW: 13.3 % (ref 11.5–15.5)
WBC: 11.9 10*3/uL — AB (ref 4.0–10.5)

## 2017-01-20 LAB — GLUCOSE, CAPILLARY
GLUCOSE-CAPILLARY: 108 mg/dL — AB (ref 65–99)
Glucose-Capillary: 183 mg/dL — ABNORMAL HIGH (ref 65–99)
Glucose-Capillary: 123 mg/dL — ABNORMAL HIGH (ref 65–99)

## 2017-01-20 LAB — BASIC METABOLIC PANEL
Anion gap: 7 (ref 5–15)
BUN: 10 mg/dL (ref 6–20)
CO2: 28 mmol/L (ref 22–32)
Calcium: 8.1 mg/dL — ABNORMAL LOW (ref 8.9–10.3)
Chloride: 94 mmol/L — ABNORMAL LOW (ref 101–111)
Creatinine, Ser: 0.64 mg/dL (ref 0.44–1.00)
GFR calc Af Amer: >60 mL/min (ref >60)
GFR calc non Af Amer: >60 mL/min (ref >60)
Glucose, Bld: 120 mg/dL — ABNORMAL HIGH (ref 65–99)
Potassium: 4.1 mmol/L (ref 3.5–5.1)
Sodium: 129 mmol/L — ABNORMAL LOW (ref 135–145)

## 2017-01-20 MED ORDER — LEVETIRACETAM 500 MG PO TABS: 500.0000 mg | ORAL_TABLET | Freq: Two times a day (BID) | ORAL | 0 refills | Status: DC

## 2017-01-20 MED ORDER — COLLAGENASE 250 UNIT/GM EX OINT
TOPICAL_OINTMENT | Freq: Every day | CUTANEOUS | Status: DC
  Administered 2017-01-20: 11:00:00 via TOPICAL
  Filled 2017-01-20: qty 30

## 2017-01-20 NOTE — Progress Notes (Signed)
Discharge paper given to husband and patient. All questions answered. Pt taken out via wheelchair with no complaints or distress

## 2017-01-20 NOTE — Op Note (Signed)
PREOP DIAGNOSIS:  1. Left parietal tumor 2. Lung cancer   POSTOP DIAGNOSIS: Same  PROCEDURE: 1. Left parietal burr hole 2. Stereotactic left parietal biopsy  SURGEON: Dr. Consuella Lose, MD  ASSISTANT: Ferne Reus, PA-C  ANESTHESIA: General Endotracheal  EBL: Minimal  SPECIMENS: Left parietal tumor  DRAINS: None  COMPLICATIONS: None immediate  CONDITION: Stable to PACU  HISTORY: Phyllis [name] is a 61 y.o. female with a history of lung cancer who presented last week with altered mental status and MRI demonstrate a left parietal lesion involving the corpus callosum. With uncertainty of diagnosis, biopsy was indicated. Risks and benefits were reviewed with the patient and her husband. After all questions were answered, consent was obtained.  PROCEDURE IN DETAIL: After informed consent was obtained and witnessed, the patient was brought to the operating room. After induction of general anesthesia, the patient was positioned on the operative table in the [place] headholder in supine position. All pressure points were meticulously padded. Preoperative stereotactic CT scan was co-registered with surface markers until a satisfactory accuracy was achieved. Trajectory to the lesion was planned on the preoperative scan. Skin incision was then marked out and prepped and draped in the usual sterile fashion.  After timeout was conducted, incision was infiltrated with anesthetic. Skin was incised and carried down to the periosteum. Self-retaining retractor was placed. Bur hole was then made after position was confirmed with stereotaxy. Dura was coagulated and opened. Stereotactic system was then used to adjust the trajectory to the actual bur hole site.  The Varioguide was then attached to the [place] and positioned with good accuracy. Biopsy needle was then introduced and two good core samples were obtained in opposing quadrants. This was sent for permanent pathology.  Bur hole  was then covered with gelfoam and a bur hole cover was placed and secured with screws. [name] was closed with 3-0 Vicryl and skin was closed with staples. Sterile dressing was applied. The [place] head holder was removed. The patient was then transferred to the stretcher and taken to the PACU in stable hemodynamic condition.  At the end of the case all sponge, needle, cottonoid, and instrument counts were correct.

## 2017-01-20 NOTE — Progress Notes (Signed)
Pt seen and examined. No issues overnight.   EXAM: Temp:  [97.3 F (36.3 C)-98.2 F (36.8 C)] 98.1 F (36.7 C) (04/19 0745) Pulse Rate:  [54-71] 65 (04/19 0900) Resp:  [8-24] 13 (04/19 0900) BP: (93-196)/(50-95) 153/76 (04/19 0900) SpO2:  [95 %-100 %] 100 % (04/19 0900) Arterial Line BP: (111-207)/(45-93) 177/69 (04/19 0900) Weight:  [51.3 kg (113 lb)] 51.3 kg (113 lb) (04/18 1418) Intake/Output      04/18 0701 - 04/19 0700 04/19 0701 - 04/20 0700   P.O.  120   I.V. (mL/kg) 1076.5 (21) 20 (0.4)   IV Piggyback 155 105   Total Intake(mL/kg) 1231.5 (24) 245 (4.8)   Urine (mL/kg/hr) 1150 350 (2)   Total Output 1150 350   Net +81.5 -105         Awake, alert, oriented Speech fluent CN intact Good strength  LABS: Lab Results  Component Value Date   CREATININE 0.64 01/20/2017   BUN 10 01/20/2017   NA 129 (L) 01/20/2017   K 4.1 01/20/2017   CL 94 (L) 01/20/2017   CO2 28 01/20/2017   Lab Results  Component Value Date   WBC 11.9 (H) 01/20/2017   HGB 14.5 01/20/2017   HCT 42.7 01/20/2017   MCV 93.6 01/20/2017   PLT 185 01/20/2017    IMPRESSION: - 61 y.o. female POD# 1 stereotactic left parietal biopsy, at baseline  PLAN: - D/C home today

## 2017-01-20 NOTE — Consult Note (Signed)
[place] Nurse wound consult note Reason for Consult: Consult requested for left leg.  Pt states she fell prior to admission several weeks ago and "got a rugburn, but it is doing better." Wound type: Full thickness wound to left calf Measurement: 5.5X3cm Wound bed: 85% slough/eschar, 15% red wound bed Drainage (amount, consistency, odor) Small amt green drainage Periwound: Darker-colored scar tissue to wound edges Dressing procedure/placement/frequency: Santyl to provide enzymatic debridement of nonviable tissue. Pt plans to discharge home today.  Discussed plan of care with patient and family member for topical treatment with Santyl at home and they verbalized understanding. Please re-consult if further assistance is needed.  Thank-you,  Julien Girt MSN, [place], [place], [place], [place]

## 2017-01-20 NOTE — Discharge Summary (Signed)
Physician Discharge Summary  Patient ID: Phyllis [name] MRN: [id number] DOB/AGE: 1956/07/11 61 y.o.  Admit date: 01/19/2017 Discharge date: 01/20/2017  Admission Diagnoses:  Left parietal brain tumor  Discharge Diagnoses:  Same Active Problems:   Brain tumor [facility])   Discharged Condition: Stable  Hospital Course:  Phyllis [name] is a 61 y.o. female admitted after uncomplicated stereotactic biopsy. She was observed overnight and was at baseline, discharged the next day.  Treatments: Surgery - Stereotactic left parietal biopsy  Discharge Exam: Blood pressure (!) 153/76, pulse 65, temperature 98.1 F (36.7 C), temperature source Oral, resp. rate 13, height _0  (1.676 m), weight 51.3 kg (113 lb), last menstrual period 10/05/1999, SpO2 100 %. Awake, alert, oriented Speech fluent, appropriate CN grossly intact 5/5 BUE/BLE Wound c/d/i  Disposition: Home  Discharge Instructions    Call MD for:  redness, tenderness, or signs of infection (pain, swelling, redness, odor or green/yellow discharge around incision site)    Complete by:  As directed    Call MD for:  temperature >100.4    Complete by:  As directed    Diet - low sodium heart healthy    Complete by:  As directed    Discharge instructions    Complete by:  As directed    Walk at home as much as possible, at least 4 times / day   Increase activity slowly    Complete by:  As directed    Lifting restrictions    Complete by:  As directed    No lifting > 10 lbs   May shower / Bathe    Complete by:  As directed    48 hours after surgery   May walk up steps    Complete by:  As directed    No dressing needed    Complete by:  As directed    Other Restrictions    Complete by:  As directed    No bending/twisting at waist     Allergies as of 01/20/2017      Reactions   Amlodipine Swelling   Meperidine Hcl Other (See Comments)   hallucinations   Naproxen Other (See Comments)   Extreme bruising       Medication List    STOP taking these medications   Glycopyrrolate-Formoterol 9-4.8 MCG/ACT Aero Commonly known as:  BEVESPI AEROSPHERE     TAKE these medications   acetaminophen 325 MG tablet Commonly known as:  TYLENOL Take 2 tablets (650 mg total) by mouth every 4 (four) hours as needed for headache or mild pain.   Albuterol Sulfate 108 (90 Base) MCG/ACT Aepb Commonly known as:  PROAIR RESPICLICK Inhale 1 puff into the lungs 4 (four) times daily as needed (For shortness of breath.).   ALPRAZolam 0.5 MG tablet Commonly known as:  XANAX TAKE 1 TABLET BY MOUTH THREE TIMES DAILY AS NEEDED What changed:  See the new instructions.   aspirin EC 81 MG tablet Take 81 mg by mouth at bedtime.   atorvastatin 40 MG tablet Commonly known as:  LIPITOR TAKE 1 TABLET(40 MG) BY MOUTH DAILY 6 PM   carvedilol 6.25 MG tablet Commonly known as:  COREG TAKE 1 TABLET(6.25 MG) BY MOUTH TWICE DAILY WITH A MEAL   dexamethasone 4 MG tablet Commonly known as:  DECADRON Take 1 tablet (4 mg total) by mouth 3 (three) times daily.   famotidine 20 MG tablet Commonly known as:  PEPCID Take 1 tablet (20 mg total) by mouth daily.   gabapentin  300 MG capsule Commonly known as:  NEURONTIN TAKE 1 CAPSULE BY MOUTH TWICE DAILY What changed:  See the new instructions.   guaiFENesin 600 MG 12 hr tablet Commonly known as:  MUCINEX Take 600 mg by mouth daily as needed for cough or to loosen phlegm.   hydrALAZINE 25 MG tablet Commonly known as:  APRESOLINE Take 1 tablet (25 mg total) by mouth 2 (two) times daily.   ibuprofen 200 MG tablet Commonly known as:  ADVIL,MOTRIN Take 400 mg by mouth every 6 (six) hours as needed for headache or moderate pain.   ipratropium-albuterol 0.5-2.5 (3) MG/3ML Soln Commonly known as:  DUONEB Take 3 mLs by nebulization 3 (three) times daily.   levETIRAcetam 500 MG tablet Commonly known as:  KEPPRA Take 1 tablet (500 mg total) by mouth 2 (two) times daily.    oxyCODONE 15 MG immediate release tablet Commonly known as:  ROXICODONE Take 1 tablet (15 mg total) by mouth every 6 (six) hours as needed. For pain. What changed:  reasons to take this  additional instructions   potassium chloride SA 20 MEQ tablet Commonly known as:  KLOR-CON M20 Take 1 tablet (20 mEq total) by mouth 2 (two) times daily.   triamcinolone ointment 0.1 % Commonly known as:  KENALOG Apply 1 application topically 2 (two) times daily. What changed:  when to take this  reasons to take this   triamterene-hydrochlorothiazide 37.5-25 MG tablet Commonly known as:  MAXZIDE-25 Take 1 tablet by mouth daily with breakfast.      Follow-up Information    Shedric Fredericks, C, MD Follow up.   Specialty:  Neurosurgery Contact information: [address] [place] [address] [phone]           Signed: Consuella Lose, Loletha Grayer 01/20/2017, 10:32 AM

## 2017-01-20 NOTE — Anesthesia Postprocedure Evaluation (Signed)
Anesthesia Post Note  Patient: Kynsie A Hardcastle  Procedure(s) Performed: Procedure(s) (LRB): LEFT CRANIOTOMY FOR STERIOTACTIC BRAIN BIOPSY (Left) APPLICATION OF CRANIAL NAVIGATION (Left)  Patient location during evaluation: PACU Anesthesia Type: General Level of consciousness: awake and alert Pain management: pain level controlled Vital Signs Assessment: post-procedure vital signs reviewed and stable Respiratory status: spontaneous breathing, nonlabored ventilation, respiratory function stable and patient connected to nasal cannula oxygen Cardiovascular status: blood pressure returned to baseline and stable Postop Assessment: no signs of nausea or vomiting Anesthetic complications: no       Last Vitals:  Vitals:   01/20/17 0700 01/20/17 0745  BP: (!) 152/77   Pulse: 69   Resp: 11   Temp:  36.7 C    Last Pain:  Vitals:   01/20/17 0745  TempSrc: Oral  PainSc:                  Tiajuana Amass

## 2017-01-28 ENCOUNTER — Other Ambulatory Visit: Payer: Self-pay | Admitting: Radiation Therapy

## 2017-01-28 DIAGNOSIS — C7949 Secondary malignant neoplasm of other parts of nervous system: Principal | ICD-10-CM

## 2017-01-28 DIAGNOSIS — C7931 Secondary malignant neoplasm of brain: Secondary | ICD-10-CM

## 2017-01-29 DIAGNOSIS — J449 Chronic obstructive pulmonary disease, unspecified: Secondary | ICD-10-CM | POA: Diagnosis not present

## 2017-02-01 ENCOUNTER — Telehealth: Payer: Self-pay | Admitting: Radiation Oncology

## 2017-02-01 NOTE — Telephone Encounter (Signed)
LM for pt to call me back.

## 2017-02-03 NOTE — Progress Notes (Signed)
Location/Histology of Brain Tumor: Left parietal lesion involving the corpus callosum.  Patient presented to [facility] on 01/11/2017.  Patient presented with Altered Mental Status.  Patient is followed by Dr. Kathyrn Sheriff as well.   01/11/2017 CT Head FINDINGS: There is a hyperdense, contrast-enhancing mass centered in the left periatrial white matter and involving the splenium of the corpus callosum, measuring 1.7 x 2.2 cm. There is mild surrounding vasogenic edema without midline shift, herniation or other significant mass effect. No other mass lesions are identified. There is no extra-axial collection. 01/11/2017 MR Brain IMPRESSION: 1. New since August 2017 solitary, hypercellular 3 cm enhancing mass in the left hemisphere, located in the medial left periatrial white matter near the splenium of the corpus callosum. Petechial hemorrhage or mineralization within the mass. Mild surrounding edema, including extending to the midline of the splenium, but no significant intracranial mass effect. 2. A solitary metastasis from lung cancer seems most likely in this clinical setting, although the differential diagnosis includes CNS Lymphoma, high-grade primary Glioma, and less likely Tumefactive Demyelination (see # 3). 3. Otherwise stable underlying chronic periventricular white matter signal abnormality which most resembles chronic demyelinating disease.  Past or anticipated interventions, if any, per neurosurgery:  50/93/2671 Procedure: APPLICATION OF CRANIAL NAVIGATION;  Surgeon: Consuella Lose, MD;  Location: [place];  Service: Neurosurgery;  Laterality: Left;  Past or anticipated interventions, if any, per medical oncology: observation with repeat CT scans of chest every six months  Dose of Decadron, if applicable: Decadron 4 mg TID  Recent neurologic symptoms, if any:   Seizures:   Headaches:   Nausea:   Dizziness/ataxia:   Difficulty with hand coordination:    Focal numbness/weakness:   Visual deficits/changes:   Confusion/Memory deficits:   Painful bone metastases at present, if any:   SAFETY ISSUES:  Prior radiation?  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 No  Possible current pregnancy? No  Is the patient on methotrexate? No  Additional Complaints / other details: 61 year old female. Married. Patient did not show for scheduled consult appointment. Patient admitted days later to [facility].

## 2017-02-07 ENCOUNTER — Ambulatory Visit: Admission: RE | Admit: 2017-02-07 | Payer: Medicare Other | Source: Ambulatory Visit

## 2017-02-07 ENCOUNTER — Telehealth: Payer: Self-pay | Admitting: Radiation Oncology

## 2017-02-07 ENCOUNTER — Ambulatory Visit: Admission: RE | Admit: 2017-02-07 | Payer: Medicare Other | Source: Ambulatory Visit | Admitting: Radiation Oncology

## 2017-02-07 ENCOUNTER — Inpatient Hospital Stay: Admission: RE | Admit: 2017-02-07 | Payer: Self-pay | Source: Ambulatory Visit

## 2017-02-07 ENCOUNTER — Ambulatory Visit
Admission: RE | Admit: 2017-02-07 | Discharge: 2017-02-07 | Disposition: A | Discharge status: Home / Self Care | Payer: Medicare Other | Source: Ambulatory Visit | Attending: Radiation Oncology | Admitting: Radiation Oncology

## 2017-02-07 DIAGNOSIS — Z7984 Long term (current) use of oral hypoglycemic drugs: Secondary | ICD-10-CM | POA: Insufficient documentation

## 2017-02-07 DIAGNOSIS — Z888 Allergy status to other drugs, medicaments and biological substances status: Secondary | ICD-10-CM | POA: Insufficient documentation

## 2017-02-07 DIAGNOSIS — Z510 Encounter for antineoplastic radiation therapy: Secondary | ICD-10-CM | POA: Insufficient documentation

## 2017-02-07 DIAGNOSIS — Z79899 Other long term (current) drug therapy: Secondary | ICD-10-CM | POA: Insufficient documentation

## 2017-02-07 DIAGNOSIS — C7931 Secondary malignant neoplasm of brain: Secondary | ICD-10-CM | POA: Insufficient documentation

## 2017-02-07 DIAGNOSIS — E876 Hypokalemia: Secondary | ICD-10-CM | POA: Insufficient documentation

## 2017-02-07 DIAGNOSIS — R233 Spontaneous ecchymoses: Secondary | ICD-10-CM | POA: Insufficient documentation

## 2017-02-07 DIAGNOSIS — E860 Dehydration: Secondary | ICD-10-CM | POA: Insufficient documentation

## 2017-02-07 DIAGNOSIS — I313 Pericardial effusion (noninflammatory): Secondary | ICD-10-CM | POA: Insufficient documentation

## 2017-02-07 DIAGNOSIS — C3490 Malignant neoplasm of unspecified part of unspecified bronchus or lung: Secondary | ICD-10-CM | POA: Insufficient documentation

## 2017-02-07 NOTE — Telephone Encounter (Signed)
Patient has not shown for 0930 appointment. Phoned her cell phone twice and left a message both times requesting a return call. Informed Shona Simpson, PA-C, Dr. Tammi Klippel and Mont Dutton of this finding.

## 2017-02-08 ENCOUNTER — Encounter (HOSPITAL_COMMUNITY): Payer: Self-pay

## 2017-02-08 ENCOUNTER — Ambulatory Visit: Payer: Self-pay | Admitting: Internal Medicine

## 2017-02-08 ENCOUNTER — Ambulatory Visit: Payer: Medicare Other

## 2017-02-08 ENCOUNTER — Ambulatory Visit: Payer: Medicare Other | Admitting: Radiation Oncology

## 2017-02-08 ENCOUNTER — Telehealth: Payer: Self-pay | Admitting: Radiation Therapy

## 2017-02-08 ENCOUNTER — Emergency Department (HOSPITAL_COMMUNITY): Payer: Medicare Other

## 2017-02-08 ENCOUNTER — Inpatient Hospital Stay (HOSPITAL_COMMUNITY)
Admission: EM | Admit: 2017-02-08 | Discharge: 2017-02-11 | DRG: 896 | Disposition: A | Discharge status: Home Health | Payer: Medicare Other | Attending: Family Medicine | Admitting: Family Medicine

## 2017-02-08 DIAGNOSIS — G9389 Other specified disorders of brain: Secondary | ICD-10-CM | POA: Diagnosis not present

## 2017-02-08 DIAGNOSIS — I272 Pulmonary hypertension, unspecified: Secondary | ICD-10-CM | POA: Diagnosis not present

## 2017-02-08 DIAGNOSIS — J439 Emphysema, unspecified: Secondary | ICD-10-CM | POA: Diagnosis not present

## 2017-02-08 DIAGNOSIS — E039 Hypothyroidism, unspecified: Secondary | ICD-10-CM | POA: Diagnosis not present

## 2017-02-08 DIAGNOSIS — R410 Disorientation, unspecified: Secondary | ICD-10-CM

## 2017-02-08 DIAGNOSIS — M810 Age-related osteoporosis without current pathological fracture: Secondary | ICD-10-CM | POA: Diagnosis present

## 2017-02-08 DIAGNOSIS — K861 Other chronic pancreatitis: Secondary | ICD-10-CM | POA: Diagnosis present

## 2017-02-08 DIAGNOSIS — G936 Cerebral edema: Secondary | ICD-10-CM | POA: Diagnosis present

## 2017-02-08 DIAGNOSIS — I5032 Chronic diastolic (congestive) heart failure: Secondary | ICD-10-CM | POA: Diagnosis not present

## 2017-02-08 DIAGNOSIS — E118 Type 2 diabetes mellitus with unspecified complications: Secondary | ICD-10-CM | POA: Diagnosis present

## 2017-02-08 DIAGNOSIS — I4581 Long QT syndrome: Secondary | ICD-10-CM | POA: Diagnosis present

## 2017-02-08 DIAGNOSIS — I2510 Atherosclerotic heart disease of native coronary artery without angina pectoris: Secondary | ICD-10-CM | POA: Diagnosis present

## 2017-02-08 DIAGNOSIS — E119 Type 2 diabetes mellitus without complications: Secondary | ICD-10-CM | POA: Diagnosis not present

## 2017-02-08 DIAGNOSIS — Z9981 Dependence on supplemental oxygen: Secondary | ICD-10-CM

## 2017-02-08 DIAGNOSIS — G9341 Metabolic encephalopathy: Secondary | ICD-10-CM | POA: Diagnosis not present

## 2017-02-08 DIAGNOSIS — E559 Vitamin D deficiency, unspecified: Secondary | ICD-10-CM | POA: Diagnosis not present

## 2017-02-08 DIAGNOSIS — E785 Hyperlipidemia, unspecified: Secondary | ICD-10-CM | POA: Diagnosis present

## 2017-02-08 DIAGNOSIS — C7931 Secondary malignant neoplasm of brain: Secondary | ICD-10-CM | POA: Diagnosis not present

## 2017-02-08 DIAGNOSIS — F1123 Opioid dependence with withdrawal: Principal | ICD-10-CM | POA: Diagnosis present

## 2017-02-08 DIAGNOSIS — F418 Other specified anxiety disorders: Secondary | ICD-10-CM

## 2017-02-08 DIAGNOSIS — G8929 Other chronic pain: Secondary | ICD-10-CM | POA: Diagnosis present

## 2017-02-08 DIAGNOSIS — E871 Hypo-osmolality and hyponatremia: Secondary | ICD-10-CM | POA: Diagnosis not present

## 2017-02-08 DIAGNOSIS — Z923 Personal history of irradiation: Secondary | ICD-10-CM

## 2017-02-08 DIAGNOSIS — Z7982 Long term (current) use of aspirin: Secondary | ICD-10-CM

## 2017-02-08 DIAGNOSIS — E43 Unspecified severe protein-calorie malnutrition: Secondary | ICD-10-CM | POA: Diagnosis present

## 2017-02-08 DIAGNOSIS — G629 Polyneuropathy, unspecified: Secondary | ICD-10-CM | POA: Diagnosis not present

## 2017-02-08 DIAGNOSIS — M545 Low back pain: Secondary | ICD-10-CM | POA: Diagnosis present

## 2017-02-08 DIAGNOSIS — B182 Chronic viral hepatitis C: Secondary | ICD-10-CM | POA: Diagnosis present

## 2017-02-08 DIAGNOSIS — Z888 Allergy status to other drugs, medicaments and biological substances status: Secondary | ICD-10-CM

## 2017-02-08 DIAGNOSIS — Z87891 Personal history of nicotine dependence: Secondary | ICD-10-CM

## 2017-02-08 DIAGNOSIS — Z681 Body mass index (BMI) 19 or less, adult: Secondary | ICD-10-CM

## 2017-02-08 DIAGNOSIS — N179 Acute kidney failure, unspecified: Secondary | ICD-10-CM | POA: Diagnosis not present

## 2017-02-08 DIAGNOSIS — C3431 Malignant neoplasm of lower lobe, right bronchus or lung: Secondary | ICD-10-CM

## 2017-02-08 DIAGNOSIS — K219 Gastro-esophageal reflux disease without esophagitis: Secondary | ICD-10-CM | POA: Diagnosis not present

## 2017-02-08 DIAGNOSIS — Z85118 Personal history of other malignant neoplasm of bronchus and lung: Secondary | ICD-10-CM

## 2017-02-08 DIAGNOSIS — G9340 Encephalopathy, unspecified: Secondary | ICD-10-CM | POA: Diagnosis not present

## 2017-02-08 DIAGNOSIS — Z9049 Acquired absence of other specified parts of digestive tract: Secondary | ICD-10-CM

## 2017-02-08 DIAGNOSIS — F1193 Opioid use, unspecified with withdrawal: Secondary | ICD-10-CM | POA: Diagnosis present

## 2017-02-08 DIAGNOSIS — F1120 Opioid dependence, uncomplicated: Secondary | ICD-10-CM | POA: Diagnosis not present

## 2017-02-08 DIAGNOSIS — L309 Dermatitis, unspecified: Secondary | ICD-10-CM | POA: Diagnosis not present

## 2017-02-08 DIAGNOSIS — F319 Bipolar disorder, unspecified: Secondary | ICD-10-CM | POA: Diagnosis present

## 2017-02-08 DIAGNOSIS — I110 Hypertensive heart disease with heart failure: Secondary | ICD-10-CM | POA: Diagnosis not present

## 2017-02-08 DIAGNOSIS — I959 Hypotension, unspecified: Secondary | ICD-10-CM | POA: Diagnosis present

## 2017-02-08 DIAGNOSIS — R64 Cachexia: Secondary | ICD-10-CM | POA: Diagnosis not present

## 2017-02-08 DIAGNOSIS — T887XXA Unspecified adverse effect of drug or medicament, initial encounter: Secondary | ICD-10-CM | POA: Diagnosis not present

## 2017-02-08 DIAGNOSIS — Z841 Family history of disorders of kidney and ureter: Secondary | ICD-10-CM

## 2017-02-08 DIAGNOSIS — E876 Hypokalemia: Secondary | ICD-10-CM | POA: Diagnosis present

## 2017-02-08 DIAGNOSIS — Z825 Family history of asthma and other chronic lower respiratory diseases: Secondary | ICD-10-CM

## 2017-02-08 DIAGNOSIS — J449 Chronic obstructive pulmonary disease, unspecified: Secondary | ICD-10-CM | POA: Diagnosis present

## 2017-02-08 DIAGNOSIS — Z886 Allergy status to analgesic agent status: Secondary | ICD-10-CM

## 2017-02-08 DIAGNOSIS — Z8249 Family history of ischemic heart disease and other diseases of the circulatory system: Secondary | ICD-10-CM

## 2017-02-08 LAB — COMPREHENSIVE METABOLIC PANEL
ALK PHOS: 96 U/L (ref 38–126)
ALT: 19 U/L (ref 14–54)
ANION GAP: 13 (ref 5–15)
AST: 27 U/L (ref 15–41)
Albumin: 3.9 g/dL (ref 3.5–5.0)
BILIRUBIN TOTAL: 1.1 mg/dL (ref 0.3–1.2)
BUN: 23 mg/dL — ABNORMAL HIGH (ref 6–20)
CALCIUM: 9.2 mg/dL (ref 8.9–10.3)
CO2: 23 mmol/L (ref 22–32)
Chloride: 92 mmol/L — ABNORMAL LOW (ref 101–111)
Creatinine, Ser: 1.1 mg/dL — ABNORMAL HIGH (ref 0.44–1.00)
GFR, EST NON AFRICAN AMERICAN: 53 mL/min — AB (ref >60)
Glucose, Bld: 151 mg/dL — ABNORMAL HIGH (ref 65–99)
Potassium: 3.2 mmol/L — ABNORMAL LOW (ref 3.5–5.1)
SODIUM: 128 mmol/L — AB (ref 135–145)
TOTAL PROTEIN: 7.3 g/dL (ref 6.5–8.1)

## 2017-02-08 LAB — CBC WITH DIFFERENTIAL/PLATELET
BASOS PCT: 0 %
Basophils Absolute: 0 10*3/uL (ref 0.0–0.1)
Eosinophils Absolute: 0 10*3/uL (ref 0.0–0.7)
Eosinophils Relative: 1 %
HCT: 50.3 % — ABNORMAL HIGH (ref 36.0–46.0)
Hemoglobin: 17.8 g/dL — ABNORMAL HIGH (ref 12.0–15.0)
LYMPHS ABS: 1.5 10*3/uL (ref 0.7–4.0)
Lymphocytes Relative: 22 %
MCH: 32.1 pg (ref 26.0–34.0)
MCHC: 35.4 g/dL (ref 30.0–36.0)
MCV: 90.8 fL (ref 78.0–100.0)
MONOS PCT: 9 %
Monocytes Absolute: 0.7 10*3/uL (ref 0.1–1.0)
NEUTROS ABS: 4.8 10*3/uL (ref 1.7–7.7)
NEUTROS PCT: 68 %
Platelets: 287 10*3/uL (ref 150–400)
RBC: 5.54 MIL/uL — ABNORMAL HIGH (ref 3.87–5.11)
RDW: 13.1 % (ref 11.5–15.5)
WBC: 6.9 10*3/uL (ref 4.0–10.5)

## 2017-02-08 LAB — MAGNESIUM: MAGNESIUM: 1.9 mg/dL (ref 1.7–2.4)

## 2017-02-08 LAB — ETHANOL

## 2017-02-08 LAB — GLUCOSE, CAPILLARY: GLUCOSE-CAPILLARY: 104 mg/dL — AB (ref 65–99)

## 2017-02-08 LAB — TSH: TSH: 1.139 u[IU]/mL (ref 0.350–4.500)

## 2017-02-08 MED ORDER — DEXAMETHASONE SODIUM PHOSPHATE 4 MG/ML IJ SOLN
4.0000 mg | Freq: Four times a day (QID) | INTRAMUSCULAR | Status: DC
  Administered 2017-02-09: 4 mg via INTRAVENOUS
  Filled 2017-02-08: qty 1

## 2017-02-08 MED ORDER — CLONIDINE HCL 0.1 MG PO TABS
0.1000 mg | ORAL_TABLET | Freq: Two times a day (BID) | ORAL | Status: DC
  Administered 2017-02-08: 0.1 mg via ORAL
  Filled 2017-02-08: qty 1

## 2017-02-08 MED ORDER — ONDANSETRON HCL 4 MG PO TABS: 4.0000 mg | ORAL_TABLET | Freq: Four times a day (QID) | ORAL | Status: DC | PRN

## 2017-02-08 MED ORDER — IPRATROPIUM-ALBUTEROL 0.5-2.5 (3) MG/3ML IN SOLN
3.0000 mL | Freq: Two times a day (BID) | RESPIRATORY_TRACT | Status: DC
  Filled 2017-02-08 (×3): qty 3

## 2017-02-08 MED ORDER — SODIUM CHLORIDE 0.9% FLUSH
3.0000 mL | Freq: Two times a day (BID) | INTRAVENOUS | Status: DC
  Administered 2017-02-09 – 2017-02-11 (×3): 3 mL via INTRAVENOUS

## 2017-02-08 MED ORDER — IPRATROPIUM-ALBUTEROL 0.5-2.5 (3) MG/3ML IN SOLN
3.0000 mL | Freq: Three times a day (TID) | RESPIRATORY_TRACT | Status: DC
  Administered 2017-02-08: 3 mL via RESPIRATORY_TRACT
  Filled 2017-02-08: qty 3

## 2017-02-08 MED ORDER — ENOXAPARIN SODIUM 40 MG/0.4ML ~~LOC~~ SOLN
40.0000 mg | Freq: Every day | SUBCUTANEOUS | Status: DC
  Administered 2017-02-08 – 2017-02-10 (×2): 40 mg via SUBCUTANEOUS
  Filled 2017-02-08 (×2): qty 0.4

## 2017-02-08 MED ORDER — SODIUM CHLORIDE 0.9 % IV BOLUS (SEPSIS)
750.0000 mL | Freq: Once | INTRAVENOUS | Status: AC
  Administered 2017-02-08: 750 mL via INTRAVENOUS

## 2017-02-08 MED ORDER — ALBUTEROL SULFATE (2.5 MG/3ML) 0.083% IN NEBU: 2.5000 mg | INHALATION_SOLUTION | Freq: Four times a day (QID) | RESPIRATORY_TRACT | Status: DC | PRN

## 2017-02-08 MED ORDER — DEXAMETHASONE SODIUM PHOSPHATE 10 MG/ML IJ SOLN
10.0000 mg | Freq: Once | INTRAMUSCULAR | Status: AC
  Administered 2017-02-08: 10 mg via INTRAVENOUS
  Filled 2017-02-08: qty 1

## 2017-02-08 MED ORDER — INSULIN ASPART 100 UNIT/ML ~~LOC~~ SOLN
0.0000 [IU] | Freq: Three times a day (TID) | SUBCUTANEOUS | Status: DC
  Administered 2017-02-09: 5 [IU] via SUBCUTANEOUS
  Administered 2017-02-09: 3 [IU] via SUBCUTANEOUS
  Administered 2017-02-09 – 2017-02-10 (×2): 2 [IU] via SUBCUTANEOUS
  Administered 2017-02-10: 1 [IU] via SUBCUTANEOUS
  Administered 2017-02-10: 2 [IU] via SUBCUTANEOUS
  Administered 2017-02-11: 1 [IU] via SUBCUTANEOUS
  Administered 2017-02-11: 2 [IU] via SUBCUTANEOUS

## 2017-02-08 MED ORDER — ONDANSETRON HCL 4 MG/2ML IJ SOLN: 4.0000 mg | Freq: Four times a day (QID) | INTRAMUSCULAR | Status: DC | PRN

## 2017-02-08 MED ORDER — INSULIN ASPART 100 UNIT/ML ~~LOC~~ SOLN: 0.0000 [IU] | Freq: Every day | SUBCUTANEOUS | Status: DC

## 2017-02-08 MED ORDER — GABAPENTIN 300 MG PO CAPS
300.0000 mg | ORAL_CAPSULE | Freq: Two times a day (BID) | ORAL | Status: DC
  Administered 2017-02-08 – 2017-02-09 (×2): 300 mg via ORAL
  Filled 2017-02-08 (×2): qty 1

## 2017-02-08 MED ORDER — ALPRAZOLAM 0.5 MG PO TABS
0.5000 mg | ORAL_TABLET | Freq: Three times a day (TID) | ORAL | Status: DC | PRN
  Administered 2017-02-09: 0.5 mg via ORAL
  Filled 2017-02-08: qty 1

## 2017-02-08 MED ORDER — ALBUTEROL SULFATE 108 (90 BASE) MCG/ACT IN AEPB: 2.0000 | INHALATION_SPRAY | Freq: Four times a day (QID) | RESPIRATORY_TRACT | Status: DC | PRN

## 2017-02-08 MED ORDER — HYDRALAZINE HCL 25 MG PO TABS
25.0000 mg | ORAL_TABLET | Freq: Two times a day (BID) | ORAL | Status: DC
  Administered 2017-02-08: 25 mg via ORAL
  Filled 2017-02-08: qty 1

## 2017-02-08 MED ORDER — CARVEDILOL 6.25 MG PO TABS
6.2500 mg | ORAL_TABLET | Freq: Two times a day (BID) | ORAL | Status: DC
  Filled 2017-02-08: qty 1

## 2017-02-08 MED ORDER — ATORVASTATIN CALCIUM 40 MG PO TABS
40.0000 mg | ORAL_TABLET | Freq: Every day | ORAL | Status: DC
  Administered 2017-02-09 – 2017-02-10 (×2): 40 mg via ORAL
  Filled 2017-02-08 (×2): qty 1

## 2017-02-08 MED ORDER — ACETAMINOPHEN 325 MG PO TABS
650.0000 mg | ORAL_TABLET | ORAL | Status: DC | PRN
  Administered 2017-02-09 – 2017-02-10 (×5): 650 mg via ORAL
  Filled 2017-02-08 (×5): qty 2

## 2017-02-08 MED ORDER — ASPIRIN EC 81 MG PO TBEC
81.0000 mg | DELAYED_RELEASE_TABLET | Freq: Every day | ORAL | Status: DC
  Administered 2017-02-09 – 2017-02-11 (×3): 81 mg via ORAL
  Filled 2017-02-08 (×3): qty 1

## 2017-02-08 MED ORDER — FAMOTIDINE IN NACL 20-0.9 MG/50ML-% IV SOLN
20.0000 mg | Freq: Every day | INTRAVENOUS | Status: DC
  Administered 2017-02-09 – 2017-02-10 (×3): 20 mg via INTRAVENOUS
  Filled 2017-02-08 (×4): qty 50

## 2017-02-08 MED ORDER — POTASSIUM CHLORIDE IN NACL 20-0.9 MEQ/L-% IV SOLN
INTRAVENOUS | Status: AC
  Administered 2017-02-08: 23:00:00 via INTRAVENOUS
  Filled 2017-02-08: qty 1000

## 2017-02-08 MED ORDER — LEVETIRACETAM 500 MG PO TABS
500.0000 mg | ORAL_TABLET | Freq: Two times a day (BID) | ORAL | Status: DC
  Administered 2017-02-09 – 2017-02-11 (×4): 500 mg via ORAL
  Filled 2017-02-08 (×5): qty 1

## 2017-02-08 MED ORDER — GUAIFENESIN ER 600 MG PO TB12: 600.0000 mg | ORAL_TABLET | Freq: Every day | ORAL | Status: DC | PRN
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
## 2017-02-08 NOTE — ED Notes (Signed)
Patient attempted to urinate but was unable to at this time.
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
## 2017-02-08 NOTE — H&P (Signed)
History and Physical    Halayna A Harner MRN:[id number] DOB: 05/29/1956 DOA: 02/08/2017  PCP: Jones, Thomas L, MD   Patient coming from: Home  Chief Complaint: Confusion, not eating or drinking, not getting out of bed  HPI: Phyllis [name] is a 61 y.o. 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 Patient had presented to the hospital one month ago with altered mental status and was found to have a brain mass. This was biopsied approximately 3 weeks ago with pathology consistent with a metastasis from lung primary. Since time of the biopsy 3 weeks ago, the patient is had increasing confusion and lethargy, not eating or drinking much of anything, and has not been getting out of bed. This has all been worsening over the past 3 weeks and family is concerned that the patient only wants to take her pain medication, but not eat or drink or take any the other medicines. She has been a no-show for serial follow-up appointments and was scheduled to have the staples removed from her scalp, but has not been seen for the appointments. Family reports that she was recently prescribed 28 days of oxycodone, but finished the bottle in less than a week. Patient reports ongoing low back pain, but denies chest pain or palpitations, denies headache, and denies abdominal pain, nausea, vomiting, or diarrhea. She denies fevers or chills.  ED Course: Upon arrival to the ED, patient is found to be afebrile, saturating well on room air, and with vital signs stable. EKG features a sinus rhythm with LVH by voltage criteria. Chemistry panels notable for sodium of 128, potassium 3.2, and serum creatinine 1.10, up from 0.64  two weeks earlier. CBC features a polycythemia with hemoglobin 17.8. Ethanol level is undetectable. Head CT is negative for acute finding, but demonstrates interval growth of hyperattenuation in left parietal periventricular intracranial metastasis with mild surrounding edema. Oncology was consulted by the ED physician, advised treating the patient with Decadron, and noted that they will see the patient in the morning. The patient remained hematologically stable in the ED and has not been in any apparent respiratory distress and will be admitted to the telemetry unit for ongoing evaluation and management of acute encephalopathy suspected secondary to progression in her brain metastasis and/or opiate overuse.  Review of Systems:  All other systems reviewed and apart from HPI, are negative.  Past Medical History:  Diagnosis Date  . Anxiety   . Asthma   . CHF (congestive heart failure) (HCC)   . Chronic back pain    "in the small of my back and lower back" (01/11/2017)  . Chronic bronchitis (HCC)   . Chronic pancreatitis (HCC)   . COPD with emphysema (HCC)   . DEPRESSION   . Eczema   . Edema 05/20/2010  . GERD 06/06/2009  . Hepatitis C    "took the treatment; I was cured" (01/11/2017)  . High cholesterol   . HYPERTENSION 06/06/2009  . Non-small cell lung cancer (NSCLC) (HCC)    NSCLC/squamous cell with bilateral pulmonary nodules treated with stereotactic radiotherapy during the fall of 2017./notes 01/11/2017  . On home oxygen therapy    "3L; 24/7" (01/11/2017)  . OSTEOPOROSIS 06/06/2009  . PERIPHERAL NEUROPATHY, LOWER EXTREMITIES, BILATERAL   06/06/2009  . Pre-diabetes   . Prolonged QT interval 01/07/2017  . Squamous cell carcinoma 06/15/2016  . TOBACCO USE 06/05/2010  . Unspecified hypothyroidism 06/05/2010  . VITAMIN D DEFICIENCY 06/06/2009    Past Surgical History:  Procedure Laterality Date  . APPLICATION OF CRANIAL NAVIGATION Left 01/19/2017   Procedure: APPLICATION OF CRANIAL NAVIGATION;  Surgeon:  Neelesh Nundkumar, MD;  Location: [facility] OR;  Service: Neurosurgery;  Laterality: Left;  . BREAST BIOPSY Right ~ 2000   "needle biopsy"   . CARDIAC CATHETERIZATION    . COLONOSCOPY    . DILATION AND CURETTAGE OF UTERUS    . LAPAROSCOPIC CHOLECYSTECTOMY    . LEFT HEART CATHETERIZATION WITH CORONARY ANGIOGRAM N/A 03/08/2013   Procedure: LEFT HEART CATHETERIZATION WITH CORONARY ANGIOGRAM;  Surgeon: Mohan N Harwani, MD;  Location: [facility] CATH LAB;  Service: Cardiovascular;  Laterality: N/A;  . LUMBAR FUSION    . LUMBAR LAMINECTOMY    . PR DURAL GRAFT REPAIR,SPINE DEFECT Left 01/19/2017   Procedure: LEFT CRANIOTOMY FOR STERIOTACTIC BRAIN BIOPSY;  Surgeon: Neelesh Nundkumar, MD;  Location: [facility] OR;  Service: Neurosurgery;  Laterality: Left;  LEFT CRANIOTOMY FOR STERIOTACTIC BRAIN BIOPSY  . TONSILLECTOMY    . TUBAL LIGATION       reports that she quit smoking about 22 months ago. Her smoking use included Cigarettes. She has a 3.50 pack-year smoking history. She has never used smokeless tobacco. She reports that she does not drink alcohol or use drugs.  Allergies  Allergen Reactions  . Amlodipine Swelling  . Meperidine Hcl Other (See Comments)    hallucinations  . Naproxen Other (See Comments)    Extreme bruising    Family History  Problem Relation Age of Onset  . COPD Father   . Hypertension Mother   . Kidney disease Mother   . Colon cancer Neg Hx   . Stomach cancer Neg Hx      Prior to Admission medications   Medication Sig Start Date End Date Taking? Authorizing Provider  oxyCODONE (ROXICODONE) 15 MG immediate release tablet Take 1 tablet (15 mg total) by mouth every 6 (six) hours as needed. For pain. Patient taking differently: Take 15 mg by mouth every 6 (six) hours as needed for pain. For pain. 12/16/16  Yes Jones, Thomas L, MD  acetaminophen (TYLENOL) 325 MG tablet Take 2 tablets (650 mg total) by mouth every 4 (four) hours as needed for headache or mild pain. Patient not taking: Reported  on 02/08/2017 01/07/17   Rama, Christina P, MD  Albuterol Sulfate (PROAIR RESPICLICK) 108 (90 BASE) MCG/ACT AEPB Inhale 1 puff into the lungs 4 (four) times daily as needed (For shortness of breath.). Patient not taking: Reported on 02/08/2017 04/01/15   Ghimire, Shanker M, MD  ALPRAZolam (XANAX) 0.5 MG tablet TAKE 1 TABLET BY MOUTH THREE TIMES DAILY AS NEEDED Patient not taking: Reported on 02/08/2017 01/09/17   Jones, Thomas L, MD  aspirin EC 81 MG tablet Take 81 mg by mouth at bedtime.     [provider]  atorvastatin (LIPITOR) 40 MG tablet TAKE 1 TABLET(40 MG) BY MOUTH DAILY 6 PM Patient not taking: Reported on 02/08/2017 05/03/16   Jones, Thomas L, MD  carvedilol (COREG) 6.25 MG tablet TAKE 1 TABLET(6.25 MG) BY MOUTH TWICE DAILY WITH A MEAL Patient not taking: Reported on 02/08/2017 08/16/16   Jones, Thomas L, MD  dexamethasone (DECADRON) 4 MG tablet Take 1 tablet (4 mg total) by mouth 3 (three) times daily. Patient not taking: Reported   on 02/08/2017 01/14/17   Bhandari, Dron Prasad, MD  famotidine (PEPCID) 20 MG tablet Take 1 tablet (20 mg total) by mouth daily. Patient not taking: Reported on 02/08/2017 01/14/17   Bhandari, Dron Prasad, MD  gabapentin (NEURONTIN) 300 MG capsule TAKE 1 CAPSULE BY MOUTH TWICE DAILY Patient not taking: Reported on 02/08/2017 07/02/16   Jones, Thomas L, MD  guaiFENesin (MUCINEX) 600 MG 12 hr tablet Take 600 mg by mouth daily as needed for cough or to loosen phlegm.     [provider]  hydrALAZINE (APRESOLINE) 25 MG tablet Take 1 tablet (25 mg total) by mouth 2 (two) times daily. Patient not taking: Reported on 02/08/2017 01/14/17   Bhandari, Dron Prasad, MD  ibuprofen (ADVIL,MOTRIN) 200 MG tablet Take 400 mg by mouth every 6 (six) hours as needed for headache or moderate pain.     [provider]  ipratropium-albuterol (DUONEB) 0.5-2.5 (3) MG/3ML SOLN Take 3 mLs by nebulization 3 (three) times daily.    [provider]  levETIRAcetam (KEPPRA) 500 MG  tablet Take 1 tablet (500 mg total) by mouth 2 (two) times daily. Patient not taking: Reported on 02/08/2017 01/20/17   Nundkumar, Neelesh, MD  potassium chloride SA (KLOR-CON M20) 20 MEQ tablet Take 1 tablet (20 mEq total) by mouth 2 (two) times daily. Patient not taking: Reported on 02/08/2017 02/09/16   Jones, Thomas L, MD  triamcinolone ointment (KENALOG) 0.1 % Apply 1 application topically 2 (two) times daily. Patient not taking: Reported on 02/08/2017 04/01/15   Ghimire, Shanker M, MD  triamterene-hydrochlorothiazide (MAXZIDE-25) 37.5-25 MG tablet Take 1 tablet by mouth daily with breakfast.  11/10/16   [provider]    Physical Exam: Vitals:   02/08/17 1428 02/08/17 1437 02/08/17 1718 02/08/17 1922  BP:  122/81 118/79 (!) 151/78  Pulse:  76 79 (!) 58  Resp:  14 18 16  Temp:  97.5 F (36.4 C) 97.8 F (36.6 C)   TempSrc:  Oral Oral   SpO2: 98% 99% 100% 100%  Weight:  51.3 kg (113 lb)    Height:  5' 6" (1.676 m)        Constitutional: NAD, calm, comfortable Eyes: PERTLA, lids and conjunctivae normal. Bilateral periorbital ecchymoses. ENMT: Mucous membranes are dry. Posterior pharynx clear of any exudate or lesions.   Neck: normal, supple, no masses, no thyromegaly Respiratory: Occasional expiratory wheeze, no crackles, no rhonchi. No accessory muscle use.  Cardiovascular: S1 & S2 heard, regular rate and rhythm. No extremity edema. No significant JVD. Abdomen: No distension, no tenderness, no masses palpated. Bowel sounds normal.  Musculoskeletal: no clubbing / cyanosis. No joint deformity upper and lower extremities.  Skin: no significant rashes, lesions, ulcers. Warm, dry, well-perfused. Poor turgor.  Neurologic: No gross facial asymmetry. Sensation intact, patellar DTR normal. Moving all extremities.  Psychiatric: Alert and oriented to person and place, not oriented to month or year. Calm and cooperative.     Labs on Admission: I have personally reviewed following labs  and imaging studies  CBC:  Recent Labs Lab 02/08/17 1912  WBC 6.9  NEUTROABS 4.8  HGB 17.8*  HCT 50.3*  MCV 90.8  PLT 287   Basic Metabolic Panel:  Recent Labs Lab 02/08/17 1912  NA 128*  K 3.2*  CL 92*  CO2 23  GLUCOSE 151*  BUN 23*  CREATININE 1.10*  CALCIUM 9.2  MG 1.9   GFR: Estimated Creatinine Clearance: 42.9 mL/min (A) (by C-G formula based on SCr of 1.1 mg/dL (  H)). Liver Function Tests:  Recent Labs Lab 02/08/17 1912  AST 27  ALT 19  ALKPHOS 96  BILITOT 1.1  PROT 7.3  ALBUMIN 3.9   No results for input(s): LIPASE, AMYLASE in the last 168 hours. No results for input(s): AMMONIA in the last 168 hours. Coagulation Profile: No results for input(s): INR, PROTIME in the last 168 hours. Cardiac Enzymes: No results for input(s): CKTOTAL, CKMB, CKMBINDEX, TROPONINI in the last 168 hours. BNP (last 3 results) No results for input(s): PROBNP in the last 8760 hours. HbA1C: No results for input(s): HGBA1C in the last 72 hours. CBG: No results for input(s): GLUCAP in the last 168 hours. Lipid Profile: No results for input(s): CHOL, HDL, LDLCALC, TRIG, CHOLHDL, LDLDIRECT in the last 72 hours. Thyroid Function Tests: No results for input(s): TSH, T4TOTAL, FREET4, T3FREE, THYROIDAB in the last 72 hours. Anemia Panel: No results for input(s): VITAMINB12, FOLATE, FERRITIN, TIBC, IRON, RETICCTPCT in the last 72 hours. Urine analysis:    Component Value Date/Time   COLORURINE YELLOW 01/11/2017 0802   APPEARANCEUR CLEAR 01/11/2017 0802   LABSPEC 1.010 01/11/2017 0802   PHURINE 6.0 01/11/2017 0802   GLUCOSEU NEGATIVE 01/11/2017 0802   GLUCOSEU NEGATIVE 12/16/2016 1444   HGBUR NEGATIVE 01/11/2017 0802   BILIRUBINUR NEGATIVE 01/11/2017 0802   KETONESUR NEGATIVE 01/11/2017 0802   PROTEINUR NEGATIVE 01/11/2017 0802   UROBILINOGEN 0.2 12/16/2016 1444   NITRITE NEGATIVE 01/11/2017 0802   LEUKOCYTESUR NEGATIVE 01/11/2017 0802   Sepsis  Labs: _0 (procalcitonin:4,lacticidven:4) )No results found for this or any previous visit (from the past 240 hour(s)).   Radiological Exams on Admission: Ct Head Wo Contrast  Result Date: 02/08/2017 CLINICAL DATA:  Recent brain surgery. Hallucinations. Poorly differentiated non-small cell lung cancer. EXAM: CT HEAD WITHOUT CONTRAST TECHNIQUE: Contiguous axial images were obtained from the base of the skull through the vertex without intravenous contrast. COMPARISON:  CT head 01/13/2017.  MR head 01/11/2017. FINDINGS: Brain: Status post LEFT parietal burr hole for stereotactic biopsy of a LEFT hemisphere mass, with subsequent diagnosis of poorly differentiated non small cell lung cancer. The mass remains hyperattenuating on noncontrast exam, but is not frankly hemorrhagic. There is mild associated vasogenic edema. There may be slight interval growth from priors, up to 3 cm long axis as compared to 2.2 cm previously, without LEFT-to-RIGHT shift. Mild mass effect on the LEFT lateral ventricle. No other mass lesions are identified on this noncontrast exam. Vascular: No hyperdense vessel or unexpected calcification. Skull: Aside from the burr hole, unremarkable. Surgical skin staples have not been removed. Sinuses/Orbits: Unremarkable. Other: None. IMPRESSION: Suspected interval growth of the hyperattenuating LEFT parietal periventricular intracranial metastasis, with mild surrounding edema but no apparent acute findings. No postsurgical complication is evident. Electronically Signed   By: Staci Righter M.D.   On: 02/08/2017 20:19    EKG: Independently reviewed. Sinus rhythm, LVH by voltage criteria.   Assessment/Plan  1. Acute encephalopathy  - Pt presents with increasing confusion over past cpl weeks - Family reports she is using far more oxycodone than prescribed - Head CT demonstrates interval growth in brain met with surrounding edema  - Likely secondary to brain met +/- excessive opiate  -  Addressing the intracranial met as below with Decadron, and addressing the opiate overuse as below    2. Lung cancer with intracranial metastasis  - NSCLC; brain mass biopsied April '18 consistent with lung primary  - Status-post radiation  - Oncology is consulting and much appreciated  - She was  given Decadron 10 mg in ED and will be continued on 4 mg q6h per oncology recommendation   3. Acute kidney injury  - SCr is 1.10 on admission, up from 0.64 two weeks earlier  - Likely a prerenal azotemia in setting of anorexia for past cpl weeks  - Continue IVF hydration, hold triamterene and HCTZ, repeat chem panel in am  4. Hyponatremia  - Serum sodium 128 in setting of dehydration; SIADH also possible  - Continue NS infusion overnight and repeat chem panel in am   5. Opiate dependence  - Family reports she is using far more of her oxycodone 15 mg than was intended  - Somnolent on arrival, becoming more alert in ED, no sign of withdrawal  - Monitor for withdrawal and treat with clonidine, prn benzodiazepine, prn antidiarrheal and antiemetics, consider resuming some opiate once more alert   - Continue gabapentin, social work consultation requested  6. Hypertension - BP persistently elevated in ED  - Continue hydralazine and Coreg, add low-dose clonidine   7. Depression and anxiety  - Difficult to assess given the clinical condition  - Continue prn Xanax     DVT prophylaxis: sq Lovenox  Code Status: Full  Family Communication: Discussed with patient Disposition Plan: Admit to telemetry Consults called: Oncology  Admission status: Inpatient    Vianne Bulls, MD [facility] Pager [phone]  If 7PM-7AM, please contact night-coverage [email or web address] Password [email or web address]  02/08/2017, 10:10 PM

## 2017-02-08 NOTE — ED Triage Notes (Addendum)
Per EMS- Patient received oxycodone for a head injury several weeks ago. Patient's husband reports that the patient has a history of opiate abuse and is now having withdrawal symptoms. Patient's husband states that the patient took too many pills and is now out of her medication.  Patient's husband also reported that the patient was suppose to have head staples removed, but refused to go to the physician's office yesterday.

## 2017-02-08 NOTE — ED Notes (Signed)
Husbands phone number [place] [phone]

## 2017-02-08 NOTE — ED Notes (Signed)
Bed: [facility] Expected date:  Expected time:  Means of arrival:  Comments: 

## 2017-02-09 ENCOUNTER — Other Ambulatory Visit: Payer: Self-pay | Admitting: Urology

## 2017-02-09 DIAGNOSIS — C3432 Malignant neoplasm of lower lobe, left bronchus or lung: Secondary | ICD-10-CM

## 2017-02-09 DIAGNOSIS — E43 Unspecified severe protein-calorie malnutrition: Secondary | ICD-10-CM | POA: Insufficient documentation

## 2017-02-09 DIAGNOSIS — G9389 Other specified disorders of brain: Secondary | ICD-10-CM

## 2017-02-09 DIAGNOSIS — C7931 Secondary malignant neoplasm of brain: Secondary | ICD-10-CM

## 2017-02-09 DIAGNOSIS — Z87891 Personal history of nicotine dependence: Secondary | ICD-10-CM

## 2017-02-09 LAB — CBC WITH DIFFERENTIAL/PLATELET
BASOS PCT: 0 %
Basophils Absolute: 0 10*3/uL (ref 0.0–0.1)
Eosinophils Absolute: 0 10*3/uL (ref 0.0–0.7)
Eosinophils Relative: 0 %
HEMATOCRIT: 44.7 % (ref 36.0–46.0)
HEMOGLOBIN: 15.2 g/dL — AB (ref 12.0–15.0)
LYMPHS ABS: 0.7 10*3/uL (ref 0.7–4.0)
Lymphocytes Relative: 18 %
MCH: 30.8 pg (ref 26.0–34.0)
MCHC: 34 g/dL (ref 30.0–36.0)
MCV: 90.7 fL (ref 78.0–100.0)
MONOS PCT: 2 %
Monocytes Absolute: 0.1 10*3/uL (ref 0.1–1.0)
NEUTROS ABS: 2.9 10*3/uL (ref 1.7–7.7)
NEUTROS PCT: 80 %
Platelets: 207 10*3/uL (ref 150–400)
RBC: 4.93 MIL/uL (ref 3.87–5.11)
RDW: 13.4 % (ref 11.5–15.5)
WBC: 3.7 10*3/uL — AB (ref 4.0–10.5)

## 2017-02-09 LAB — GLUCOSE, CAPILLARY
Glucose-Capillary: 223 mg/dL — ABNORMAL HIGH (ref 65–99)
Glucose-Capillary: 256 mg/dL — ABNORMAL HIGH (ref 65–99)
Glucose-Capillary: 249 mg/dL — ABNORMAL HIGH (ref 65–99)
Glucose-Capillary: 188 mg/dL — ABNORMAL HIGH (ref 65–99)

## 2017-02-09 LAB — BASIC METABOLIC PANEL
Anion gap: 11 (ref 5–15)
BUN: 24 mg/dL — ABNORMAL HIGH (ref 6–20)
CALCIUM: 8.5 mg/dL — AB (ref 8.9–10.3)
CHLORIDE: 98 mmol/L — AB (ref 101–111)
CO2: 21 mmol/L — AB (ref 22–32)
Creatinine, Ser: 0.92 mg/dL (ref 0.44–1.00)
GFR calc non Af Amer: >60 mL/min (ref >60)
Glucose, Bld: 154 mg/dL — ABNORMAL HIGH (ref 65–99)
POTASSIUM: 4 mmol/L (ref 3.5–5.1)
SODIUM: 130 mmol/L — AB (ref 135–145)

## 2017-02-09 MED ORDER — OXYCODONE HCL 5 MG PO TABS
15.0000 mg | ORAL_TABLET | Freq: Two times a day (BID) | ORAL | Status: DC | PRN
  Administered 2017-02-09 – 2017-02-11 (×4): 15 mg via ORAL
  Filled 2017-02-09 (×4): qty 3

## 2017-02-09 MED ORDER — GABAPENTIN 400 MG PO CAPS
400.0000 mg | ORAL_CAPSULE | Freq: Two times a day (BID) | ORAL | Status: DC
  Administered 2017-02-09 – 2017-02-11 (×4): 400 mg via ORAL
  Filled 2017-02-09 (×4): qty 1

## 2017-02-09 MED ORDER — CARVEDILOL 6.25 MG PO TABS
6.2500 mg | ORAL_TABLET | Freq: Two times a day (BID) | ORAL | Status: DC
  Administered 2017-02-10 – 2017-02-11 (×3): 6.25 mg via ORAL
  Filled 2017-02-09 (×4): qty 1

## 2017-02-09 MED ORDER — DEXAMETHASONE 4 MG PO TABS
4.0000 mg | ORAL_TABLET | Freq: Four times a day (QID) | ORAL | Status: DC
  Administered 2017-02-09 – 2017-02-11 (×8): 4 mg via ORAL
  Filled 2017-02-09 (×10): qty 1

## 2017-02-09 MED ORDER — OXYCODONE HCL 5 MG PO TABS
15.0000 mg | ORAL_TABLET | Freq: Four times a day (QID) | ORAL | Status: DC | PRN
  Administered 2017-02-09: 15 mg via ORAL
  Filled 2017-02-09: qty 3

## 2017-02-09 MED ORDER — DULOXETINE HCL 30 MG PO CPEP
30.0000 mg | ORAL_CAPSULE | Freq: Every day | ORAL | Status: DC
  Administered 2017-02-09 – 2017-02-11 (×3): 30 mg via ORAL
  Filled 2017-02-09 (×3): qty 1

## 2017-02-09 MED ORDER — CLONIDINE HCL 0.1 MG PO TABS
0.1000 mg | ORAL_TABLET | Freq: Every day | ORAL | Status: DC
  Administered 2017-02-09 – 2017-02-11 (×3): 0.1 mg via ORAL
  Filled 2017-02-09 (×3): qty 1

## 2017-02-09 NOTE — Progress Notes (Signed)
PROGRESS NOTE    Phyllis [name]  YYT:[id number] DOB: 08-23-1956 DOA: 02/08/2017 PCP: [name], MD  Outpatient Specialists:  nandkumar-neurosurgery Mohammed-oncology    Brief Narrative:   60 COPD Pulmonary hypertension DM TY II Chronic diastolic heart failure Hyperlipidemia Severe protein energy malnutrition Hypothyroid  Diagnosed with an NSCLC fall 2017-no metastatic disease last XRT 07/2016 Recently treated in the hospital for/10 through 01/14/17 with vasogenic edema and a 3 cm enhancing parietal mass sent home on steroids had outpatient evaluation and stero-tactic surgery.  Family reported that the patient has been taking more medication than usual I spoke to the husband and person who states that "patient is buying pills off the street from drug dealers" Patient finishes apparently a whole month's supply of treatment within 2-3 days  Labs on admission sodium 128 BUN/creatinine 23/1.1 WBC 3.7 hemoglobin 15.2  Assessment & Plan:   Principal Problem:   Acute encephalopathy Active Problems:   Hypothyroidism   Depression with anxiety   GERD   CAD (coronary artery disease), native coronary artery   Hypokalemia   COPD (chronic obstructive pulmonary disease) with chronic bronchitis (HCC)   Type II diabetes mellitus with manifestations ([place])   Primary cancer of right lower lobe of lung (HCC)   Chronic diastolic CHF (congestive heart failure) (HCC)   Brain metastasis (HCC)   Opiate dependence, continuous ([place])   Hyponatremia   Opiate withdrawal (HCC)   AKI (acute kidney injury) ([place])   Protein-calorie malnutrition, severe   Metabolic encephalopathy Likely related to opiate diversion and exacerbated by possible chronic pain and depression Psychiatry has been consulted and social worker will discuss with family what options are available I am not sure that she has capacity but will defer to psychiatry We will defer decision-making until we get further input  from psychiatry Husband is aware that medically her clinical problems are pretty stable and will stabilize probably by am  Mild hyponatremia 128 Improved to 130 Will saline lock in a.m. Recheck labs in a.m.  Mild hypokalemia Replacing with saline 90 cc an hour and K in saline  Mild hypotension Discontinue by mouth hydralazine Change clonidine 0.1 twice a day-->daily Reschedule Coreg 6.25 twice a day  COPD smoker No current wheeze rales or rhonchi  NSCLC with brain metastases Patient had stated that the surgery. Staples are still present. Given one-time dosing of pain meds and placed back prior schedule of pain medications for now. Convert IV Decadron to Decadron 4 mg every 6 hours, continue Pressure 500 twice a day for seizure prophylaxis  Chronic pain given history from husband and ancillary collateral information, will not E prescribing any medications for the patient as there is a real concern of overdose and misuse We checked on the narcotics registry and the pharmacist was only able to find a prescription from 2016 from physician Dr. 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 Continue sliding-scale coverage sensitive scale At sugar range between 150- 275  Chronic diastolic heart failure Seems to be euvolemic  Moderate to severe malnutrition from 4 by mouth intake, cancer   Lovenox Inpatient Discontinue telemetry today Discussed with husband-anticipate if all remains stable patient will discharge home with resources after input from psychiatry and social work  Consultants:   Psychiatry  Procedures:   None  Antimicrobials:   None    Subjective: Alert pleasant oriented no apparent distress Eating and drinking only moderately 9 complaining that she  needs pain meds but cannot tell me where her specific pain is-indicates that she has had chronic pain in the past in her back and  this is why she has been prescribed opiates Nursing reports had episodic diarrhea and wasn't able to get to the restroom on time but is able to control her stool  Objective: Vitals:   02/08/17 2340 02/09/17 0544 02/09/17 0844 02/09/17 1232  BP:  (!) 93/55 (!) 95/55 (!) 136/100  Pulse:  63 (!) 59   Resp:  18 16   Temp:  98.2 F (36.8 C) 97.4 F (36.3 C)   TempSrc:  Oral Axillary   SpO2: 97% 92%    Weight:  50.6 kg (111 lb 8.8 oz)    Height:        Intake/Output Summary (Last 24 hours) at 02/09/17 1313 Last data filed at 02/09/17 0900  Gross per 24 hour  Intake            819.5 ml  Output                0 ml  Net            819.5 ml   Filed Weights   02/08/17 1437 02/09/17 0544  Weight: 51.3 kg (113 lb) 50.6 kg (111 lb 8.8 oz)    Examination:  Frail cachectic pleasant oriented, staples present Poor dentition No JVD S1-S2 no murmur rub or gallop Chest clinically clear Abdomen soft nontender nondistended Lower extremities are soft nontender Cranial nerves 1 through 12 are grossly intact with power bilaterally equal  skin is intact without deficit no lower extremity edema    Data Reviewed: I have personally reviewed following labs and imaging studies  CBC:  Recent Labs Lab 02/08/17 1912 02/09/17 0510  WBC 6.9 3.7*  NEUTROABS 4.8 2.9  HGB 17.8* 15.2*  HCT 50.3* 44.7  MCV 90.8 90.7  PLT 287 233   Basic Metabolic Panel:  Recent Labs Lab 02/08/17 1912 02/09/17 0510  NA 128* 130*  K 3.2* 4.0  CL 92* 98*  CO2 23 21*  GLUCOSE 151* 154*  BUN 23* 24*  CREATININE 1.10* 0.92  CALCIUM 9.2 8.5*  MG 1.9  --    GFR: Estimated Creatinine Clearance: 50.6 mL/min (by C-G formula based on SCr of 0.92 mg/dL). Liver Function Tests:  Recent Labs Lab 02/08/17 1912  AST 27  ALT 19  ALKPHOS 96  BILITOT 1.1  PROT 7.3  ALBUMIN 3.9   No results for input(s): LIPASE, AMYLASE in the last 168 hours. No results for input(s): AMMONIA in the last 168  hours. Coagulation Profile: No results for input(s): INR, PROTIME in the last 168 hours. Cardiac Enzymes: No results for input(s): CKTOTAL, CKMB, CKMBINDEX, TROPONINI in the last 168 hours. BNP (last 3 results) No results for input(s): PROBNP in the last 8760 hours. HbA1C: No results for input(s): HGBA1C in the last 72 hours. CBG:  Recent Labs Lab 02/08/17 2334 02/09/17 0747 02/09/17 1128  GLUCAP 104* 223* 256*   Lipid Profile: No results for input(s): CHOL, HDL, LDLCALC, TRIG, CHOLHDL, LDLDIRECT in the last 72 hours. Thyroid Function Tests:  Recent Labs  02/08/17 2249  TSH 1.139   Anemia Panel: No results for input(s): VITAMINB12, FOLATE, FERRITIN, TIBC, IRON, RETICCTPCT in the last 72 hours. Urine analysis:    Component Value Date/Time   COLORURINE YELLOW 01/11/2017 0802   APPEARANCEUR CLEAR 01/11/2017 0802   LABSPEC 1.010 01/11/2017 0802   PHURINE 6.0 01/11/2017 0802  GLUCOSEU NEGATIVE 01/11/2017 0802   GLUCOSEU NEGATIVE 12/16/2016 1444   HGBUR NEGATIVE 01/11/2017 0802   BILIRUBINUR NEGATIVE 01/11/2017 0802   KETONESUR NEGATIVE 01/11/2017 0802   PROTEINUR NEGATIVE 01/11/2017 0802   UROBILINOGEN 0.2 12/16/2016 1444   NITRITE NEGATIVE 01/11/2017 0802   LEUKOCYTESUR NEGATIVE 01/11/2017 0802   Sepsis Labs: _0 (procalcitonin:4,lacticidven:4)  )No results found for this or any previous visit (from the past 240 hour(s)).       Radiology Studies: Ct Head Wo Contrast  Result Date: 02/08/2017 CLINICAL DATA:  Recent brain surgery. Hallucinations. Poorly differentiated non-small cell lung cancer. EXAM: CT HEAD WITHOUT CONTRAST TECHNIQUE: Contiguous axial images were obtained from the base of the skull through the vertex without intravenous contrast. COMPARISON:  CT head 01/13/2017.  MR head 01/11/2017. FINDINGS: Brain: Status post LEFT parietal burr hole for stereotactic biopsy of a LEFT hemisphere mass, with subsequent diagnosis of poorly differentiated non  small cell lung cancer. The mass remains hyperattenuating on noncontrast exam, but is not frankly hemorrhagic. There is mild associated vasogenic edema. There may be slight interval growth from priors, up to 3 cm long axis as compared to 2.2 cm previously, without LEFT-to-RIGHT shift. Mild mass effect on the LEFT lateral ventricle. No other mass lesions are identified on this noncontrast exam. Vascular: No hyperdense vessel or unexpected calcification. Skull: Aside from the burr hole, unremarkable. Surgical skin staples have not been removed. Sinuses/Orbits: Unremarkable. Other: None. IMPRESSION: Suspected interval growth of the hyperattenuating LEFT parietal periventricular intracranial metastasis, with mild surrounding edema but no apparent acute findings. No postsurgical complication is evident. Electronically Signed   By: Staci Righter M.D.   On: 02/08/2017 20:19        Scheduled Meds: . aspirin EC  81 mg Oral Daily  . atorvastatin  40 mg Oral q1800  . carvedilol  6.25 mg Oral BID WC  . cloNIDine  0.1 mg Oral Daily  . dexamethasone  4 mg Oral Q6H  . enoxaparin (LOVENOX) injection  40 mg Subcutaneous QHS  . gabapentin  300 mg Oral BID  . insulin aspart  0-5 Units Subcutaneous QHS  . insulin aspart  0-9 Units Subcutaneous TID WC  . ipratropium-albuterol  3 mL Nebulization BID  . levETIRAcetam  500 mg Oral BID  . sodium chloride flush  3 mL Intravenous Q12H   Continuous Infusions: . famotidine (PEPCID) IV Stopped (02/09/17 0107)     LOS: 1 day    Time spent: [place], MD [facility] ([phone]   If 7PM-7AM, please contact night-coverage [email or web address] Password [email or web address] 02/09/2017, 1:13 PM

## 2017-02-09 NOTE — Progress Notes (Signed)
Initial Nutrition Assessment  DOCUMENTATION CODES:   Severe malnutrition in context of chronic illness  INTERVENTION:   Magic cup TID with meals, each supplement provides 290 kcal and 9 grams of protein  snacks  NUTRITION DIAGNOSIS:   Malnutrition (severe) related to cancer and cancer related treatments as evidenced by moderate depletions of muscle mass, moderate depletion of body fat, 21 percent weight loss in 6 months.  GOAL:   Patient will meet greater than or equal to 90% of their needs  MONITOR:   PO intake, Supplement acceptance, Labs, Weight trends, Skin  REASON FOR ASSESSMENT:   Malnutrition Screening Tool    ASSESSMENT:   61 y.o. 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   Met with pt in room today. Pt reluctant to talk or make eye contact. Pt reports that she has not eaten well for at least 6 weeks. Per chart, pt has lost 30lbs(21%) in 6 months; this is severe. Pt currently eating 25% full liquid diet. Pt does not like supplements but is willing to try Magic Cups; RD will order. RD discussed with pt the importance of adequate protein intake given her disease process. Continue to encourage intake of meals and snacks.   Medications reviewed and include: aspirin, lovenox, insulin, pepcid  Labs reviewed: Na 130(L), Cl 98(L), BUN 24(H), Ca 8.5(L) Wbc- 3.7(L) cbgs- 151, 154 x 24 hrs AIC 6.2(H) 4/10  Nutrition-Focused physical exam completed. Findings are moderate fat depletion in arms and chest, moderate to severe muscle depletion over entire body, and no edema.   Diet Order:  Diet full liquid Room service appropriate? Yes; Fluid consistency: Thin  Skin:   Wound (see comment) (incision head, leg wound)  Last BM:  5/8  Height:   Ht Readings from Last 1 Encounters:  02/08/17 _0  (1.676 m)    Weight:   Wt Readings from Last 1 Encounters:  02/09/17 111 lb 8.8 oz (50.6 kg)    Ideal Body Weight:  59 kg  BMI:  Body mass index is 18.01 kg/m.  Estimated Nutritional Needs:   Kcal:  1500-1800kcal/day   Protein:  76-87g/day   Fluid:  >1.5L/day   EDUCATION NEEDS:   No education needs identified at this time  Koleen Distance, RD, LDN Pager #[phone]
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
## 2017-02-09 NOTE — Clinical Social Work Note (Signed)
Clinical Social Work Assessment  Patient Details  Name: Phyllis [name] MRN: [id number] Date of Birth: Jan 29, 1956  Date of referral:  02/09/17               Reason for consult:  Substance Use/ETOH Abuse, Mental Health Concerns                Permission sought to share information with:  Family Supports Permission granted to share information::  Yes, Verbal Permission Granted  Name::     [place]::     Relationship::  spouse  Contact Information:  [phone]  Housing/Transportation Living arrangements for the past 2 months:  Single Family Home Source of Information:  Patient, Spouse Patient Interpreter Needed:  None Criminal Activity/Legal Involvement Pertinent to Current Situation/Hospitalization:  No - Comment as needed Significant Relationships:  Spouse (grandchildren) Lives with:  Self, Spouse (grandchild) Do you feel safe going back to the place where you live?  Yes Need for family participation in patient care:  No (Coment)  Care giving concerns:  Pt from home where she lives with husband and their 12 year old grandson. Pt denies caregiving concerns. She states she has been "told she has issues with painkillers," but denies feeling as if opioid use is problematic for her. She reports she has been prescribed opiates for 30+ years. States she does not take more than prescribed, also adds that she is "prescribed nerve pills." Reports that when she does not take opiates for 2+ days, she has w/d symptoms of cramps and joint pain. States she has received psychiatric care in the past but does not elaborate. When asked about depressive/anxious symptoms, pt became quiet and nodded but does not elaborate. States she feels as if she has no support system outside herself. States she feels "like her husband does not care about her." Denies SI/HI and does not exhibit psychotic symptoms when speaking with this Probation officer.  Allowed CSW to speak with husband as well. Husband spoke at  length with Probation officer re: family's concern that pt is "taking too many of her pain pills." States family's attempts to intervene are met with hostility on pt's part. States family has attempted IVC several times, most recently last Sunday 02/06/17, however IVCs have not been upheld. Reports Sunday, the magistrate "told us we did not have a case for a commitment bc she had not shown that she felt suicidal or homicidal." States magistrate directed them to [employer], however pt did not consent to [facility] coming to provide assessment.  Husband states he "has tried to hide all the knives in the house to keep her safe." denies that pt has attempted suicide in the past. Does state that pt was in an inpatient program 15 years ago but husband is unsure what she was treated for, unclear about "substance abuse or depression." (pt denies this admission) Husband states he has called law enforcement several times in past weeks "because she was threatening me," however "when police get there is she calm and they explain they can't do anything." Husband states he wants pt to "get help for the pills." Understanding that pt is not consenting to SA treatment at this time.   Social Worker assessment / plan:  CSW consulted for mental health concerns and reported opioid use. Met with pt at bedside. Explained role and reason for consult. Pt receptive to involvement but denies MH/SA needs. Does note she may be open to referrals to therapy due to feeling she lacks social  support, but adamantly denies any other services.  Husband participated in assessment as well. Reports pt is minimizing her recent mental health issues and longstanding opioid use.  Psychiatry consult pending.  Plan: will follow for social work needs pending psychiatric evaluation.   Employment status:  Retired Nurse, adult PT Recommendations:  Not assessed at this time Information / Referral to community resources:      Patient/Family's Response to care:  Pt reports she feels her care has not been adequate, states "they aren't doing anything for me." States she appreciates psychiatry and CSW involvement while denying any current needs.   Patient/Family's Understanding of and Emotional Response to Diagnosis, Current Treatment, and Prognosis:  Pt appears to be minimizing MH needs and recent events. Minimizes opioid use compared to husband's report.  Spouse demonstrated adequate understanding of treatment and current barriers.  Emotional Assessment Appearance:  Appears stated age Attitude/Demeanor/Rapport:   (appropriate, pleasant) Affect (typically observed):  Calm Orientation:  Oriented to Self, Oriented to Place, Oriented to  Time, Oriented to Situation Alcohol / Substance use:   (opioid use) Psych involvement (Current and /or in the community):  Yes (Comment) (psychiatry consult pending)  Discharge Needs  Concerns to be addressed:  Denies Needs/Concerns at this time Readmission within the last 30 days:  Yes Current discharge risk:  None Barriers to Discharge:  Continued Medical Work up (still assessing )   Nila Nephew, LCSW 02/09/2017, 3:31 PM

## 2017-02-09 NOTE — Progress Notes (Signed)
Pt refusing to have staples removed. Unable to provide urine sample at this time.

## 2017-02-09 NOTE — Progress Notes (Signed)
Attempted to remove staples, was able to remove 1 and attempt to remove a second, but patient was screaming and saying to stop, that it was too painful, saying she needed pain medicine in order for her to tolerate the pain. MD notified.
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
## 2017-02-10 ENCOUNTER — Ambulatory Visit: Payer: Medicare Other | Admitting: Radiation Oncology

## 2017-02-10 ENCOUNTER — Telehealth: Payer: Self-pay | Admitting: Radiation Therapy

## 2017-02-10 ENCOUNTER — Inpatient Hospital Stay (HOSPITAL_COMMUNITY): Payer: Medicare Other

## 2017-02-10 LAB — BASIC METABOLIC PANEL
ANION GAP: 8 (ref 5–15)
BUN: 25 mg/dL — ABNORMAL HIGH (ref 6–20)
CO2: 23 mmol/L (ref 22–32)
Calcium: 8.8 mg/dL — ABNORMAL LOW (ref 8.9–10.3)
Chloride: 97 mmol/L — ABNORMAL LOW (ref 101–111)
Creatinine, Ser: 0.94 mg/dL (ref 0.44–1.00)
GFR calc Af Amer: >60 mL/min (ref >60)
GLUCOSE: 161 mg/dL — AB (ref 65–99)
POTASSIUM: 3.9 mmol/L (ref 3.5–5.1)
Sodium: 128 mmol/L — ABNORMAL LOW (ref 135–145)

## 2017-02-10 LAB — HEMOGLOBIN A1C
HEMOGLOBIN A1C: 7.4 % — AB (ref 4.8–5.6)
MEAN PLASMA GLUCOSE: 166 mg/dL

## 2017-02-10 LAB — GLUCOSE, CAPILLARY
GLUCOSE-CAPILLARY: 184 mg/dL — AB (ref 65–99)
GLUCOSE-CAPILLARY: 169 mg/dL — AB (ref 65–99)
GLUCOSE-CAPILLARY: 132 mg/dL — AB (ref 65–99)
GLUCOSE-CAPILLARY: 132 mg/dL — AB (ref 65–99)

## 2017-02-10 MED ORDER — GADOBENATE DIMEGLUMINE 529 MG/ML IV SOLN
10.0000 mL | Freq: Once | INTRAVENOUS | Status: AC | PRN
  Administered 2017-02-10: 10 mL via INTRAVENOUS

## 2017-02-10 MED ORDER — TRAMADOL HCL 50 MG PO TABS
50.0000 mg | ORAL_TABLET | Freq: Four times a day (QID) | ORAL | Status: DC
  Administered 2017-02-10 – 2017-02-11 (×4): 50 mg via ORAL
  Filled 2017-02-10 (×4): qty 1

## 2017-02-10 NOTE — Telephone Encounter (Signed)
Spoke with the patient's IP nurse about the pending MRI. It is ordered as a 3T SRS protocol, but per Dr. Tammi Klippel, it can be done here at [facility] on the 1.5T using the [facility] protocol as well. The goal is that we are able to get her scanned and go forward with radiation treatment to her known brain met. She plans to share this information during this morning's rounds to get the MRI done.  Dr. Tammi Klippel and Dr. Kathyrn Sheriff are aware of her hospital admission and would like to move forward with the plan to treat her with post operative SRS as the patient will allow.  Mont Dutton R.T.(R)(T) Special Procedures Navigator

## 2017-02-10 NOTE — Progress Notes (Signed)
PROGRESS NOTE    Phyllis [name]  HFW:[id number] DOB: Apr 06, 1956 DOA: 02/08/2017 PCP: [name], MD  Outpatient Specialists:  nandkumar-neurosurgery Mohammed-oncology    Brief Narrative:   60 COPD Pulmonary hypertension DM TY II Chronic diastolic heart failure Hyperlipidemia Severe protein energy malnutrition Hypothyroid  Diagnosed with an NSCLC fall 2017-no metastatic disease last XRT 07/2016 Recently treated in the hospital for/10 through 01/14/17 with vasogenic edema and a 3 cm enhancing parietal mass sent home on steroids had outpatient evaluation and stero-tactic surgery.  Family reported that the patient has been taking more medication than usual I spoke to the husband and person who states that "patient is buying pills off the street from drug dealers" Patient finishes apparently a whole month's supply of treatment within 2-3 days  Labs on admission sodium 128 BUN/creatinine 23/1.1 WBC 3.7 hemoglobin 15.2  Assessment & Plan:   Principal Problem:   Acute encephalopathy Active Problems:   Hypothyroidism   Depression with anxiety   GERD   CAD (coronary artery disease), native coronary artery   Hypokalemia   COPD (chronic obstructive pulmonary disease) with chronic bronchitis (HCC)   Type II diabetes mellitus with manifestations ([place])   Primary cancer of right lower lobe of lung (HCC)   Chronic diastolic CHF (congestive heart failure) (HCC)   Brain metastasis (HCC)   Opiate dependence, continuous (HCC)   Hyponatremia   Opiate withdrawal (HCC)   AKI (acute kidney injury) ([place])   Protein-calorie malnutrition, severe   Metabolic encephalopathy Likely related to opiate diversion and exacerbated by possible chronic pain and depression Psychiatry has been consulted and do not feel she meets criterion for in hospital crisis stabilization social worker will discuss with family what options are available  Mild hyponatremia 128 Improved to 130 Await  labs  Mild hypokalemia Replacing with saline 90 cc an hour and K in saline  Mild hypotension Discontinue by mouth hydralazine Change clonidine 0.1 twice a day-->daily Reschedule Coreg 6.25 twice a day  COPD smoker No current wheeze rales or rhonchi  NSCLC with brain metastases Discussed case with Dr. Tammi Klippel of radiation oncology who feels given patient is To body in's who be best to obtain MRI and radiation therapy while in hospital Convert IV Decadron to Decadron 4 mg every 6 hours, continue Pressure 500 twice a day for seizure prophylaxis  Chronic pain given history from husband and ancillary collateral information, will not E prescribing any medications for the patient as there is a real concern of overdose and misuse We checked on the narcotics registry and the pharmacist was only able to find a prescription from 2016 from physician Dr. 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 Continue sliding-scale coverage sensitive scale At sugar range between 027-741  Chronic diastolic heart failure Seems to be euvolemic  Moderate to severe malnutrition from 4 by mouth intake, cancer   Lovenox Inpatient Discontinue telemetry today Discussed with husband-anticipate if all remains stable patient will discharge home with resources after input from psychiatry and social work  Consultants:   Psychiatry  Procedures:   None  Antimicrobials:   None    Subjective: Less talkative today and not as severe pain although reported severe 10/10 headache to nurse and reports to me although sitting comfortable in bed severe abdominal pain No other issues Tolerating diet only fairly No diarrhea no further accidents  Objective: Vitals:   02/09/17 1359 02/09/17 2128 02/10/17 0426 02/10/17 0829  BP: 95/61 (!) 112/58 134/63 (!) 142/76  Pulse: 76  64 67 67  Resp: _0 Temp: 97.7 F (36.5 C) 97.7 F (36.5 C) 98 F (36.7 C)   TempSrc: Oral Oral Oral   SpO2: 94%  100% 100%   Weight:   52.5 kg (115 lb 11.9 oz)   Height:        Intake/Output Summary (Last 24 hours) at 02/10/17 1054 Last data filed at 02/10/17 0900  Gross per 24 hour  Intake              600 ml  Output                0 ml  Net              600 ml   Filed Weights   02/08/17 1437 02/09/17 0544 02/10/17 0426  Weight: 51.3 kg (113 lb) 50.6 kg (111 lb 8.8 oz) 52.5 kg (115 lb 11.9 oz)    Examination:  Pleasant cachectic no apparent distress Temporal wasting S1-S2 slightly tachycardic chest is clear Abdomen soft nontender nondistended Lower extremities soft Moving all 4 limbs equally without deficit Reflexes are grossly normal in both lower extremities   Data Reviewed: I have personally reviewed following labs and imaging studies  CBC:  Recent Labs Lab 02/08/17 1912 02/09/17 0510  WBC 6.9 3.7*  NEUTROABS 4.8 2.9  HGB 17.8* 15.2*  HCT 50.3* 44.7  MCV 90.8 90.7  PLT 287 707   Basic Metabolic Panel:  Recent Labs Lab 02/08/17 1912 02/09/17 0510  NA 128* 130*  K 3.2* 4.0  CL 92* 98*  CO2 23 21*  GLUCOSE 151* 154*  BUN 23* 24*  CREATININE 1.10* 0.92  CALCIUM 9.2 8.5*  MG 1.9  --    GFR: Estimated Creatinine Clearance: 52.5 mL/min (by C-G formula based on SCr of 0.92 mg/dL). Liver Function Tests:  Recent Labs Lab 02/08/17 1912  AST 27  ALT 19  ALKPHOS 96  BILITOT 1.1  PROT 7.3  ALBUMIN 3.9   No results for input(s): LIPASE, AMYLASE in the last 168 hours. No results for input(s): AMMONIA in the last 168 hours. Coagulation Profile: No results for input(s): INR, PROTIME in the last 168 hours. Cardiac Enzymes: No results for input(s): CKTOTAL, CKMB, CKMBINDEX, TROPONINI in the last 168 hours. BNP (last 3 results) No results for input(s): PROBNP in the last 8760 hours. HbA1C:  Recent Labs  02/08/17 1912  HGBA1C 7.4*   CBG:  Recent Labs Lab 02/09/17 0747 02/09/17 1128 02/09/17 1647 02/09/17 2125 02/10/17 0745  GLUCAP 223* 256* 249* 188*  184*   Lipid Profile: No results for input(s): CHOL, HDL, LDLCALC, TRIG, CHOLHDL, LDLDIRECT in the last 72 hours. Thyroid Function Tests:  Recent Labs  02/08/17 2249  TSH 1.139   Anemia Panel: No results for input(s): VITAMINB12, FOLATE, FERRITIN, TIBC, IRON, RETICCTPCT in the last 72 hours. Urine analysis:    Component Value Date/Time   COLORURINE YELLOW 01/11/2017 0802   APPEARANCEUR CLEAR 01/11/2017 0802   LABSPEC 1.010 01/11/2017 0802   PHURINE 6.0 01/11/2017 0802   GLUCOSEU NEGATIVE 01/11/2017 0802   GLUCOSEU NEGATIVE 12/16/2016 1444   HGBUR NEGATIVE 01/11/2017 0802   BILIRUBINUR NEGATIVE 01/11/2017 0802   KETONESUR NEGATIVE 01/11/2017 0802   PROTEINUR NEGATIVE 01/11/2017 0802   UROBILINOGEN 0.2 12/16/2016 1444   NITRITE NEGATIVE 01/11/2017 0802   LEUKOCYTESUR NEGATIVE 01/11/2017 0802   Sepsis Labs: _1 (procalcitonin:4,lacticidven:4)  )No results found for this or any previous visit (from the past 240 hour(s)).  Radiology Studies: Ct Head Wo Contrast  Result Date: 02/08/2017 CLINICAL DATA:  Recent brain surgery. Hallucinations. Poorly differentiated non-small cell lung cancer. EXAM: CT HEAD WITHOUT CONTRAST TECHNIQUE: Contiguous axial images were obtained from the base of the skull through the vertex without intravenous contrast. COMPARISON:  CT head 01/13/2017.  MR head 01/11/2017. FINDINGS: Brain: Status post LEFT parietal burr hole for stereotactic biopsy of a LEFT hemisphere mass, with subsequent diagnosis of poorly differentiated non small cell lung cancer. The mass remains hyperattenuating on noncontrast exam, but is not frankly hemorrhagic. There is mild associated vasogenic edema. There may be slight interval growth from priors, up to 3 cm long axis as compared to 2.2 cm previously, without LEFT-to-RIGHT shift. Mild mass effect on the LEFT lateral ventricle. No other mass lesions are identified on this noncontrast exam. Vascular: No hyperdense vessel  or unexpected calcification. Skull: Aside from the burr hole, unremarkable. Surgical skin staples have not been removed. Sinuses/Orbits: Unremarkable. Other: None. IMPRESSION: Suspected interval growth of the hyperattenuating LEFT parietal periventricular intracranial metastasis, with mild surrounding edema but no apparent acute findings. No postsurgical complication is evident. Electronically Signed   By: Staci Righter M.D.   On: 02/08/2017 20:19        Scheduled Meds: . aspirin EC  81 mg Oral Daily  . atorvastatin  40 mg Oral q1800  . carvedilol  6.25 mg Oral BID WC  . cloNIDine  0.1 mg Oral Daily  . dexamethasone  4 mg Oral Q6H  . DULoxetine  30 mg Oral Daily  . enoxaparin (LOVENOX) injection  40 mg Subcutaneous QHS  . gabapentin  400 mg Oral BID  . insulin aspart  0-5 Units Subcutaneous QHS  . insulin aspart  0-9 Units Subcutaneous TID WC  . levETIRAcetam  500 mg Oral BID  . sodium chloride flush  3 mL Intravenous Q12H   Continuous Infusions: . famotidine (PEPCID) IV Stopped (02/09/17 2155)     LOS: 2 days    Time spent: [place], MD [facility] (P) [phone]   If 7PM-7AM, please contact night-coverage [email or web address] Password [email or web address] 02/10/2017, 10:54 AM

## 2017-02-11 ENCOUNTER — Ambulatory Visit: Payer: Medicare Other

## 2017-02-11 ENCOUNTER — Ambulatory Visit
Admit: 2017-02-11 | Discharge: 2017-02-11 | Disposition: A | Discharge status: Home / Self Care | Payer: Medicare Other | Attending: Radiation Oncology | Admitting: Radiation Oncology

## 2017-02-11 DIAGNOSIS — C7931 Secondary malignant neoplasm of brain: Secondary | ICD-10-CM

## 2017-02-11 LAB — GLUCOSE, CAPILLARY
GLUCOSE-CAPILLARY: 171 mg/dL — AB (ref 65–99)
GLUCOSE-CAPILLARY: 142 mg/dL — AB (ref 65–99)

## 2017-02-11 MED ORDER — DEXAMETHASONE 4 MG PO TABS: ORAL_TABLET | ORAL | 0 refills | Status: DC

## 2017-02-11 MED ORDER — DULOXETINE HCL 30 MG PO CPEP: 30.0000 mg | ORAL_CAPSULE | Freq: Every day | ORAL | 3 refills | Status: DC

## 2017-02-11 MED ORDER — CLONIDINE HCL 0.1 MG PO TABS: 0.1000 mg | ORAL_TABLET | Freq: Every day | ORAL | 11 refills | Status: DC

## 2017-02-11 NOTE — Progress Notes (Signed)
Received patient in the clinic from the floor for srs consent. BUN elevated at 25, creatinine WNL at 0.94 and GFR less than 60. Per Freeman Caldron, PA_C patient is safe to receive IV contrast. Informed Deedra Ehrich, RT of this finding.

## 2017-02-11 NOTE — Progress Notes (Signed)
Right ac 20 gauge IV occluded. Removed IV. Catheter intact upon removal. Applied an occlusive dressing to old access site. Patient tolerated well. Documented such on IV flowsheet. CT/SIM performed without IV constrast. per Ashlyn Bruning, PA_C. Unable to establish new IV access site. Several unsuccessful attempts were made to notify RN on [place] of these events.

## 2017-02-11 NOTE — Progress Notes (Signed)
PT Cancellation Note  Patient Details Name: Phyllis [name] MRN: [id number] DOB: 1955-11-08   Cancelled Treatment:    Reason Eval/Treat Not Completed: Patient at procedure or test/unavailable (pt off floor. Will follow. )   Philomena Doheny 02/11/2017, 10:36 AM [phone]

## 2017-02-11 NOTE — Evaluation (Signed)
Physical Therapy Evaluation Patient Details Name: Phyllis [name] MRN: [id number] DOB: 02/10/1956 Today's Date: 02/11/2017   History of Present Illness  61 y.o. 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  Clinical Impression  Pt is independent with mobility, she ambulated 300' without an assistive device, no loss of balance. SaO2 98% on RA at rest, pulse oximeter did not give reading with ambulation, but pt was not SOB. No further PT indicated as she is independent with mobility. Some confusion noted, she was oriented to self and location, but not to year.     Follow Up Recommendations No PT follow up; 24* supervision 2* mild confusion    Equipment Recommendations  None recommended by PT    Recommendations for Other Services       Precautions / Restrictions Precautions Precautions: None Precaution Comments: pt denies h/o falls Restrictions Weight Bearing Restrictions: No      Mobility  Bed Mobility Overal bed mobility: Independent                Transfers Overall transfer level: Independent                  Ambulation/Gait Ambulation/Gait assistance: Independent Ambulation Distance (Feet): 300 Feet Assistive device: None Gait Pattern/deviations: WFL(Within Functional Limits)     General Gait Details: steady, no LOB, SaO2 98% on RA at rest, unable to get reading with ambulation but no dyspnea  Stairs            Wheelchair Mobility    Modified Rankin (Stroke Patients Only)       Balance Overall balance assessment: Independent                                           Pertinent Vitals/Pain Pain Assessment:  0-10 Pain Score: 6  Pain Location: chronic back pain  Pain Descriptors / Indicators: Aching Pain Intervention(s): Limited activity within patient's tolerance;Monitored during session;Premedicated before session    Home Living Family/patient expects to be discharged to:: Private residence Living Arrangements: Spouse/significant other Available Help at Discharge: Family;Available 24 hours/day Type of Home: House Home Access: Stairs to enter Entrance Stairs-Rails: None Entrance Stairs-Number of Steps: 4 Home Layout: One level Home Equipment: Grab bars - tub/shower;Walker - 2 wheels;Other (comment) (home O2)      Prior Function Level of Independence: Independent         Comments: walks without AD, uses 3L O2 "sometimes"     Hand Dominance   Dominant Hand: Right    Extremity/Trunk Assessment   Upper Extremity Assessment Upper Extremity Assessment: Overall WFL for tasks assessed    Lower Extremity Assessment Lower Extremity Assessment: Overall WFL for tasks assessed    Cervical / Trunk Assessment Cervical / Trunk Assessment: Normal  Communication   Communication: No difficulties  Cognition Arousal/Alertness: Awake/alert Behavior During Therapy: WFL for tasks assessed/performed Overall Cognitive Status: No family/caregiver present to determine baseline cognitive functioning (pt oriented to self and location, not to year (said its 2016))  General Comments: follows commands, gave differing answers to questions (when asked if she's had falls she said yes, then later said, "I didn't actually fall, I had a surgery that bruised my face and just made it look like I fell".       General Comments      Exercises     Assessment/Plan    PT Assessment Patent does not need any further PT services  PT Problem List         PT Treatment Interventions      PT Goals (Current goals can be found in the Care Plan section)  Acute  Rehab PT Goals PT Goal Formulation: All assessment and education complete, DC therapy    Frequency     Barriers to discharge        Co-evaluation               AM-PAC PT "6 Clicks" Daily Activity  Outcome Measure Difficulty turning over in bed (including adjusting bedclothes, sheets and blankets)?: None Difficulty moving from lying on back to sitting on the side of the bed? : None Difficulty sitting down on and standing up from a chair with arms (e.g., wheelchair, bedside commode, etc,.)?: None Help needed moving to and from a bed to chair (including a wheelchair)?: None Help needed walking in hospital room?: None Help needed climbing 3-5 steps with a railing? : None 6 Click Score: 24    End of Session Equipment Utilized During Treatment: Gait belt Activity Tolerance: Patient tolerated treatment well Patient left: in chair;with call bell/phone within reach;with chair alarm set Nurse Communication: Mobility status      Time: 5726-2035 PT Time Calculation (min) (ACUTE ONLY): 21 min   Charges:   PT Evaluation $PT Eval Low Complexity: 1 Procedure     PT G CodesPhilomena Doheny 02/11/2017, 11:13 AM [phone]

## 2017-02-11 NOTE — Discharge Summary (Signed)
Physician Discharge Summary  Phyllis [name] NWG:[id number] DOB: 05/18/1956 DOA: 02/08/2017  PCP: [name], MD  Admit date: 02/08/2017 Discharge date: 02/11/2017  Time spent: 45 minutes  Recommendations for Outpatient Follow-up:  1. Need sOP follow up for XRT 2. No OPIATES NOR COPNTROLLED SUBT on d/c--Will forward note to Dr. Ronnald Ramp as FYI 3. Taper staeroids as per my d/c MAR--further taper as per Rad onc  Discharge Diagnoses:  Principal Problem:   Acute encephalopathy Active Problems:   Hypothyroidism   Depression with anxiety   GERD   CAD (coronary artery disease), native coronary artery   Hypokalemia   COPD (chronic obstructive pulmonary disease) with chronic bronchitis (HCC)   Type II diabetes mellitus with manifestations ([place])   Primary cancer of right lower lobe of lung (HCC)   Chronic diastolic CHF (congestive heart failure) (HCC)   Brain metastasis (HCC)   Opiate dependence, continuous ([place])   Hyponatremia   Opiate withdrawal (HCC)   AKI (acute kidney injury) ([place])   Protein-calorie malnutrition, severe   Discharge Condition: gaurded-high risk re-admission given social constraints  Diet recommendation: reg  Filed Weights   02/09/17 0544 02/10/17 0426 02/11/17 0615  Weight: 50.6 kg (111 lb 8.8 oz) 52.5 kg (115 lb 11.9 oz) 52.3 kg (115 lb 4.8 oz)    History of present illness:  60 COPD Pulmonary hypertension DM TY II Chronic diastolic heart failure Hyperlipidemia Severe protein energy malnutrition Hypothyroid  Diagnosed with an NSCLC fall 2017-no metastatic disease last XRT 07/2016 Recently treated in the hospital for/10 through 01/14/17 with vasogenic edema and a 3 cm enhancing parietal mass sent home on steroids had outpatient evaluation and stero-tactic surgery.  Family reported that the patient has been taking more medication than usual I spoke to the husband and person who states that "patient is buying pills off the street from drug  dealers" Patient finishes apparently a whole month's supply of treatment within 2-3 days  Labs on admission sodium 128 BUN/creatinine 23/1.1 WBC 3.7 hemoglobin 15.2  Hospital Course:   Metabolic encephalopathy Likely related to opiate diversion and exacerbated by possible chronic pain and depression Psychiatry has been consulted and do not feel she meets criterion for in hospital crisis stabilization HH rn and PT to help patient requested on d/c She is at high risk for substance abuse She should be kept on decadron for a while until XRT  Mild hyponatremia 128 Improved to 130  Mild hypokalemia Replacing with saline 90 cc an hour and K in saline Resolved on d/c  Mild hypotension Discontinue by mouth hydralazine Change clonidine 0.1 twice a day-->daily Reschedule Coreg 6.25 twice a day  COPD smoker No current wheeze rales or rhonchi  NSCLC with brain metastases Discussed case with Dr. Tammi Klippel of radiation oncology-stim and testing done--for XRt sometime in the week of 5/14   Convert IV Decadron to Decadron 4 mg every 6 hours with taper as per MAr Cont aeds  Chronic pain given history from husband and ancillary collateral information, will not E prescribing any medications for the patient as there is a real concern of overdose and misuse We checked on the narcotics registry and she has multiple refills from Dr. Ricka Burdock namse is casandra-one "r"] on registry NO FURTHER CONTROLLED SUBSTANCES FYI'd Dr. 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 Continue sliding-scale coverage sensitive scale as op  Chronic diastolic heart failure Seems to be euvolemic  Moderate to severe malnutrition from 4 by mouth intake, cancer   Procedures:  Ct and stim for XRT  Consultations:  Psych  XRT  Discharge Exam: Vitals:   02/10/17 2113 02/11/17 0543  BP: (!) 149/75 (!) 154/91  Pulse: (!) 55 64  Resp: 16 16  Temp: 98 F (36.7 C) 98.6 F (37 C)     General: eomi ncat Cardiovascular: s1 s2 no m/r/g Respiratory: clear no added sound  Discharge Instructions    Current Discharge Medication List    START taking these medications   Details  cloNIDine (CATAPRES) 0.1 MG tablet Take 1 tablet (0.1 mg total) by mouth daily. Qty: 60 tablet, Refills: 11    DULoxetine (CYMBALTA) 30 MG capsule Take 1 capsule (30 mg total) by mouth daily. Qty: 30 capsule, Refills: 3      CONTINUE these medications which have CHANGED   Details  dexamethasone (DECADRON) 4 MG tablet Take 4 tablets for 2 days, then 2 tablets for 4 days then 1 tablet daily Qty: 30 tablet, Refills: 0      CONTINUE these medications which have NOT CHANGED   Details  acetaminophen (TYLENOL) 325 MG tablet Take 2 tablets (650 mg total) by mouth every 4 (four) hours as needed for headache or mild pain.    Albuterol Sulfate (PROAIR RESPICLICK) 233 (90 BASE) MCG/ACT AEPB Inhale 1 puff into the lungs 4 (four) times daily as needed (For shortness of breath.). Qty: 1 each, Refills: 0   Associated Diagnoses: COPD bronchitis    aspirin EC 81 MG tablet Take 81 mg by mouth at bedtime.     atorvastatin (LIPITOR) 40 MG tablet TAKE 1 TABLET(40 MG) BY MOUTH DAILY 6 PM Qty: 30 tablet, Refills: 11   Associated Diagnoses: Coronary artery disease involving native coronary artery of native heart without angina pectoris    carvedilol (COREG) 6.25 MG tablet TAKE 1 TABLET(6.25 MG) BY MOUTH TWICE DAILY WITH A MEAL Qty: 60 tablet, Refills: 5   Associated Diagnoses: Essential hypertension, benign; Coronary artery disease involving native coronary artery of native heart without angina pectoris    famotidine (PEPCID) 20 MG tablet Take 1 tablet (20 mg total) by mouth daily. Qty: 30 tablet, Refills: 0    gabapentin (NEURONTIN) 300 MG capsule TAKE 1 CAPSULE BY MOUTH TWICE DAILY Qty: 60 capsule, Refills: 11    guaiFENesin (MUCINEX) 600 MG 12 hr tablet Take 600 mg by mouth daily as needed for  cough or to loosen phlegm.     ibuprofen (ADVIL,MOTRIN) 200 MG tablet Take 400 mg by mouth every 6 (six) hours as needed for headache or moderate pain.     ipratropium-albuterol (DUONEB) 0.5-2.5 (3) MG/3ML SOLN Take 3 mLs by nebulization 3 (three) times daily.    levETIRAcetam (KEPPRA) 500 MG tablet Take 1 tablet (500 mg total) by mouth 2 (two) times daily. Qty: 30 tablet, Refills: 0    triamcinolone ointment (KENALOG) 0.1 % Apply 1 application topically 2 (two) times daily. Qty: 30 g, Refills: 0   Associated Diagnoses: Thrush    triamterene-hydrochlorothiazide (MAXZIDE-25) 37.5-25 MG tablet Take 1 tablet by mouth daily with breakfast.  Refills: 1      STOP taking these medications     oxyCODONE (ROXICODONE) 15 MG immediate release tablet      ALPRAZolam (XANAX) 0.5 MG tablet      hydrALAZINE (APRESOLINE) 25 MG tablet      potassium chloride SA (KLOR-CON M20) 20 MEQ tablet        Allergies  Allergen Reactions  . Amlodipine Swelling  . Meperidine Hcl Other (See Comments)  hallucinations  . Naproxen Other (See Comments)    Extreme bruising      The results of significant diagnostics from this hospitalization (including imaging, microbiology, ancillary and laboratory) are listed below for reference.    Significant Diagnostic Studies: Ct Head Wo Contrast  Result Date: 02/08/2017 CLINICAL DATA:  Recent brain surgery. Hallucinations. Poorly differentiated non-small cell lung cancer. EXAM: CT HEAD WITHOUT CONTRAST TECHNIQUE: Contiguous axial images were obtained from the base of the skull through the vertex without intravenous contrast. COMPARISON:  CT head 01/13/2017.  MR head 01/11/2017. FINDINGS: Brain: Status post LEFT parietal burr hole for stereotactic biopsy of a LEFT hemisphere mass, with subsequent diagnosis of poorly differentiated non small cell lung cancer. The mass remains hyperattenuating on noncontrast exam, but is not frankly hemorrhagic. There is mild  associated vasogenic edema. There may be slight interval growth from priors, up to 3 cm long axis as compared to 2.2 cm previously, without LEFT-to-RIGHT shift. Mild mass effect on the LEFT lateral ventricle. No other mass lesions are identified on this noncontrast exam. Vascular: No hyperdense vessel or unexpected calcification. Skull: Aside from the burr hole, unremarkable. Surgical skin staples have not been removed. Sinuses/Orbits: Unremarkable. Other: None. IMPRESSION: Suspected interval growth of the hyperattenuating LEFT parietal periventricular intracranial metastasis, with mild surrounding edema but no apparent acute findings. No postsurgical complication is evident. Electronically Signed   By: Staci Righter M.D.   On: 02/08/2017 20:19   Ct Head W & Wo Contrast  Result Date: 01/14/2017 CLINICAL DATA:  Brain tumor.  Preoperative localization. EXAM: CT HEAD WITHOUT AND WITH CONTRAST TECHNIQUE: Contiguous axial images were obtained from the base of the skull through the vertex without and with intravenous contrast CONTRAST:  43m ISOVUE-300 IOPAMIDOL (ISOVUE-300) INJECTION 61% COMPARISON:  Brain MRI 01/11/2017 FINDINGS: There is a hyperdense, contrast-enhancing mass centered in the left periatrial white matter and involving the splenium of the corpus callosum, measuring 1.7 x 2.2 cm. There is mild surrounding vasogenic edema without midline shift, herniation or other significant mass effect. No other mass lesions are identified. There is no extra-axial collection. IMPRESSION: 1. Preoperative localization of hyperdense, contrast-enhancing mass centered in the left periatrial white matter and involving the splenium of the corpus callosum. 2. No other lesions. Electronically Signed   By: KUlyses JarredM.D.   On: 01/14/2017 02:11   Mr BJeri CosWEXContrast  Result Date: 02/10/2017 CLINICAL DATA:  Brain mass status post craniotomy. EXAM: MRI HEAD WITHOUT AND WITH CONTRAST TECHNIQUE: Multiplanar, multiecho  pulse sequences of the brain and surrounding structures were obtained without and with intravenous contrast. CONTRAST:  171mMULTIHANCE GADOBENATE DIMEGLUMINE 529 MG/ML IV SOLN COMPARISON:  Head CT 02/08/2017 Brain MRI 01/11/2017 FINDINGS: Brain: The midline structures are normal. No focal diffusion restriction to indicate acute infarct. No intraparenchymal hemorrhage. The contrast-enhancing mass within the periatrial left parietal white matter, involving the left aspect of the splenium of the corpus callosum, now measures 3.5 x 2.4 cm, previously 3.1 x 1.8 cm. The degree of associated hyperintense T2 weighted signal is also increased. Diffusion-weighted imaging shows likely involvement of the contralateral corpus callosum splenium. There is mass effect on the atrium of the left lateral ventricle, but no midline shift. There are no new contrast-enhancing lesions. Multiple round low T1, high T2 weighted signal foci within the periventricular and deep white matter are unchanged. There is hemosiderin again seen within the mass. No hydrocephalus. Vascular: Major intracranial arterial and venous sinus flow voids are preserved. Skull and upper  cervical spine: Left parietal burr hole. Sinuses/Orbits: No fluid levels or advanced mucosal thickening. No mastoid effusion. Normal orbits. IMPRESSION: 1. Increased size of mass located in the left parietal periatrial white matter, involving the splenium of the corpus callosum. Biopsy results show metastatic lung carcinoma. 2. Increased T2 weighted signal hyperintensity surrounding the lesion, likely a combination of edema related to tumor growth and recent biopsy. 3. Unchanged appearance of multiple round foci of signal abnormality without contrast enhancement, which may indicate chronic demyelinating lesions. Electronically Signed   By: Ulyses Jarred M.D.   On: 02/10/2017 20:11    Microbiology: No results found for this or any previous visit (from the past 240 hour(s)).    Labs: Basic Metabolic Panel:  Recent Labs Lab 02/08/17 1912 02/09/17 0510 02/10/17 1210  NA 128* 130* 128*  K 3.2* 4.0 3.9  CL 92* 98* 97*  CO2 23 21* 23  GLUCOSE 151* 154* 161*  BUN 23* 24* 25*  CREATININE 1.10* 0.92 0.94  CALCIUM 9.2 8.5* 8.8*  MG 1.9  --   --    Liver Function Tests:  Recent Labs Lab 02/08/17 1912  AST 27  ALT 19  ALKPHOS 96  BILITOT 1.1  PROT 7.3  ALBUMIN 3.9   No results for input(s): LIPASE, AMYLASE in the last 168 hours. No results for input(s): AMMONIA in the last 168 hours. CBC:  Recent Labs Lab 02/08/17 1912 02/09/17 0510  WBC 6.9 3.7*  NEUTROABS 4.8 2.9  HGB 17.8* 15.2*  HCT 50.3* 44.7  MCV 90.8 90.7  PLT 287 207   Cardiac Enzymes: No results for input(s): CKTOTAL, CKMB, CKMBINDEX, TROPONINI in the last 168 hours. BNP: BNP (last 3 results)  Recent Labs  01/06/17 0153  BNP 351.2*    ProBNP (last 3 results) No results for input(s): PROBNP in the last 8760 hours.  CBG:  Recent Labs Lab 02/10/17 1206 02/10/17 1651 02/10/17 2115 02/11/17 0743 02/11/17 1216  GLUCAP 169* 132* 132* 171* 142*       Signed:  Nita Sells MD   [facility] 02/11/2017, 1:27 PM

## 2017-02-12 ENCOUNTER — Encounter (HOSPITAL_COMMUNITY): Payer: Self-pay | Admitting: *Deleted

## 2017-02-12 ENCOUNTER — Inpatient Hospital Stay (HOSPITAL_COMMUNITY)
Admission: EM | Admit: 2017-02-12 | Discharge: 2017-02-17 | DRG: 896 | Disposition: A | Discharge status: Home Health | Payer: Medicare Other | Attending: Internal Medicine | Admitting: Internal Medicine

## 2017-02-12 ENCOUNTER — Emergency Department (HOSPITAL_COMMUNITY): Payer: Medicare Other

## 2017-02-12 DIAGNOSIS — Z9049 Acquired absence of other specified parts of digestive tract: Secondary | ICD-10-CM | POA: Diagnosis not present

## 2017-02-12 DIAGNOSIS — Z85118 Personal history of other malignant neoplasm of bronchus and lung: Secondary | ICD-10-CM | POA: Diagnosis not present

## 2017-02-12 DIAGNOSIS — R109 Unspecified abdominal pain: Secondary | ICD-10-CM | POA: Diagnosis not present

## 2017-02-12 DIAGNOSIS — E785 Hyperlipidemia, unspecified: Secondary | ICD-10-CM | POA: Diagnosis not present

## 2017-02-12 DIAGNOSIS — J189 Pneumonia, unspecified organism: Secondary | ICD-10-CM

## 2017-02-12 DIAGNOSIS — I2510 Atherosclerotic heart disease of native coronary artery without angina pectoris: Secondary | ICD-10-CM | POA: Diagnosis not present

## 2017-02-12 DIAGNOSIS — I110 Hypertensive heart disease with heart failure: Secondary | ICD-10-CM | POA: Diagnosis not present

## 2017-02-12 DIAGNOSIS — L309 Dermatitis, unspecified: Secondary | ICD-10-CM | POA: Diagnosis present

## 2017-02-12 DIAGNOSIS — K861 Other chronic pancreatitis: Secondary | ICD-10-CM | POA: Diagnosis not present

## 2017-02-12 DIAGNOSIS — I5032 Chronic diastolic (congestive) heart failure: Secondary | ICD-10-CM | POA: Diagnosis present

## 2017-02-12 DIAGNOSIS — E118 Type 2 diabetes mellitus with unspecified complications: Secondary | ICD-10-CM | POA: Diagnosis not present

## 2017-02-12 DIAGNOSIS — E559 Vitamin D deficiency, unspecified: Secondary | ICD-10-CM | POA: Diagnosis present

## 2017-02-12 DIAGNOSIS — J439 Emphysema, unspecified: Secondary | ICD-10-CM | POA: Diagnosis not present

## 2017-02-12 DIAGNOSIS — E222 Syndrome of inappropriate secretion of antidiuretic hormone: Secondary | ICD-10-CM | POA: Diagnosis present

## 2017-02-12 DIAGNOSIS — Z8619 Personal history of other infectious and parasitic diseases: Secondary | ICD-10-CM

## 2017-02-12 DIAGNOSIS — C7931 Secondary malignant neoplasm of brain: Secondary | ICD-10-CM | POA: Diagnosis not present

## 2017-02-12 DIAGNOSIS — J449 Chronic obstructive pulmonary disease, unspecified: Secondary | ICD-10-CM | POA: Diagnosis not present

## 2017-02-12 DIAGNOSIS — R9389 Abnormal findings on diagnostic imaging of other specified body structures: Secondary | ICD-10-CM | POA: Diagnosis present

## 2017-02-12 DIAGNOSIS — Z85828 Personal history of other malignant neoplasm of skin: Secondary | ICD-10-CM

## 2017-02-12 DIAGNOSIS — I10 Essential (primary) hypertension: Secondary | ICD-10-CM

## 2017-02-12 DIAGNOSIS — R64 Cachexia: Secondary | ICD-10-CM | POA: Diagnosis present

## 2017-02-12 DIAGNOSIS — F1123 Opioid dependence with withdrawal: Secondary | ICD-10-CM | POA: Diagnosis not present

## 2017-02-12 DIAGNOSIS — Z9981 Dependence on supplemental oxygen: Secondary | ICD-10-CM

## 2017-02-12 DIAGNOSIS — E860 Dehydration: Secondary | ICD-10-CM | POA: Diagnosis present

## 2017-02-12 DIAGNOSIS — Z681 Body mass index (BMI) 19 or less, adult: Secondary | ICD-10-CM

## 2017-02-12 DIAGNOSIS — R739 Hyperglycemia, unspecified: Secondary | ICD-10-CM | POA: Diagnosis not present

## 2017-02-12 DIAGNOSIS — Z87891 Personal history of nicotine dependence: Secondary | ICD-10-CM

## 2017-02-12 DIAGNOSIS — Z841 Family history of disorders of kidney and ureter: Secondary | ICD-10-CM

## 2017-02-12 DIAGNOSIS — Z9114 Patient's other noncompliance with medication regimen: Secondary | ICD-10-CM

## 2017-02-12 DIAGNOSIS — E871 Hypo-osmolality and hyponatremia: Secondary | ICD-10-CM | POA: Diagnosis not present

## 2017-02-12 DIAGNOSIS — E7800 Pure hypercholesterolemia, unspecified: Secondary | ICD-10-CM | POA: Diagnosis present

## 2017-02-12 DIAGNOSIS — I272 Pulmonary hypertension, unspecified: Secondary | ICD-10-CM | POA: Diagnosis not present

## 2017-02-12 DIAGNOSIS — F319 Bipolar disorder, unspecified: Secondary | ICD-10-CM | POA: Diagnosis present

## 2017-02-12 DIAGNOSIS — I161 Hypertensive emergency: Secondary | ICD-10-CM | POA: Diagnosis present

## 2017-02-12 DIAGNOSIS — G92 Toxic encephalopathy: Secondary | ICD-10-CM | POA: Diagnosis not present

## 2017-02-12 DIAGNOSIS — E039 Hypothyroidism, unspecified: Secondary | ICD-10-CM | POA: Diagnosis not present

## 2017-02-12 DIAGNOSIS — G8929 Other chronic pain: Secondary | ICD-10-CM | POA: Diagnosis present

## 2017-02-12 DIAGNOSIS — R079 Chest pain, unspecified: Secondary | ICD-10-CM | POA: Diagnosis not present

## 2017-02-12 DIAGNOSIS — R51 Headache: Secondary | ICD-10-CM

## 2017-02-12 DIAGNOSIS — Z886 Allergy status to analgesic agent status: Secondary | ICD-10-CM

## 2017-02-12 DIAGNOSIS — K219 Gastro-esophageal reflux disease without esophagitis: Secondary | ICD-10-CM | POA: Diagnosis present

## 2017-02-12 DIAGNOSIS — Z825 Family history of asthma and other chronic lower respiratory diseases: Secondary | ICD-10-CM

## 2017-02-12 DIAGNOSIS — Z888 Allergy status to other drugs, medicaments and biological substances status: Secondary | ICD-10-CM

## 2017-02-12 DIAGNOSIS — R7303 Prediabetes: Secondary | ICD-10-CM | POA: Diagnosis present

## 2017-02-12 DIAGNOSIS — I160 Hypertensive urgency: Secondary | ICD-10-CM | POA: Diagnosis not present

## 2017-02-12 DIAGNOSIS — F19939 Other psychoactive substance use, unspecified with withdrawal, unspecified: Secondary | ICD-10-CM | POA: Diagnosis not present

## 2017-02-12 DIAGNOSIS — F1193 Opioid use, unspecified with withdrawal: Secondary | ICD-10-CM | POA: Diagnosis not present

## 2017-02-12 DIAGNOSIS — M810 Age-related osteoporosis without current pathological fracture: Secondary | ICD-10-CM | POA: Diagnosis present

## 2017-02-12 DIAGNOSIS — F13239 Sedative, hypnotic or anxiolytic dependence with withdrawal, unspecified: Principal | ICD-10-CM | POA: Diagnosis present

## 2017-02-12 DIAGNOSIS — C3490 Malignant neoplasm of unspecified part of unspecified bronchus or lung: Secondary | ICD-10-CM | POA: Diagnosis present

## 2017-02-12 DIAGNOSIS — E43 Unspecified severe protein-calorie malnutrition: Secondary | ICD-10-CM | POA: Diagnosis not present

## 2017-02-12 DIAGNOSIS — R0602 Shortness of breath: Secondary | ICD-10-CM | POA: Diagnosis not present

## 2017-02-12 DIAGNOSIS — R938 Abnormal findings on diagnostic imaging of other specified body structures: Secondary | ICD-10-CM | POA: Diagnosis not present

## 2017-02-12 DIAGNOSIS — G629 Polyneuropathy, unspecified: Secondary | ICD-10-CM | POA: Diagnosis present

## 2017-02-12 DIAGNOSIS — Z9119 Patient's noncompliance with other medical treatment and regimen: Secondary | ICD-10-CM

## 2017-02-12 DIAGNOSIS — R339 Retention of urine, unspecified: Secondary | ICD-10-CM | POA: Diagnosis present

## 2017-02-12 DIAGNOSIS — R519 Headache, unspecified: Secondary | ICD-10-CM

## 2017-02-12 DIAGNOSIS — Z8249 Family history of ischemic heart disease and other diseases of the circulatory system: Secondary | ICD-10-CM

## 2017-02-12 LAB — CBC
HEMATOCRIT: 46.6 % — AB (ref 36.0–46.0)
Hemoglobin: 15.9 g/dL — ABNORMAL HIGH (ref 12.0–15.0)
MCH: 31.2 pg (ref 26.0–34.0)
MCHC: 34.1 g/dL (ref 30.0–36.0)
MCV: 91.4 fL (ref 78.0–100.0)
Platelets: 249 10*3/uL (ref 150–400)
RBC: 5.1 MIL/uL (ref 3.87–5.11)
RDW: 12.7 % (ref 11.5–15.5)
WBC: 6.1 10*3/uL (ref 4.0–10.5)

## 2017-02-12 LAB — I-STAT CHEM 8, ED
BUN: 26 mg/dL — ABNORMAL HIGH (ref 6–20)
CALCIUM ION: 1.09 mmol/L — AB (ref 1.15–1.40)
CHLORIDE: 96 mmol/L — AB (ref 101–111)
Creatinine, Ser: 0.9 mg/dL (ref 0.44–1.00)
Glucose, Bld: 226 mg/dL — ABNORMAL HIGH (ref 65–99)
HCT: 51 % — ABNORMAL HIGH (ref 36.0–46.0)
Hemoglobin: 17.3 g/dL — ABNORMAL HIGH (ref 12.0–15.0)
POTASSIUM: 4 mmol/L (ref 3.5–5.1)
SODIUM: 129 mmol/L — AB (ref 135–145)
TCO2: 24 mmol/L (ref 0–100)

## 2017-02-12 LAB — BASIC METABOLIC PANEL
ANION GAP: 8 (ref 5–15)
BUN: 23 mg/dL — ABNORMAL HIGH (ref 6–20)
CHLORIDE: 94 mmol/L — AB (ref 101–111)
CO2: 25 mmol/L (ref 22–32)
Calcium: 9.1 mg/dL (ref 8.9–10.3)
Creatinine, Ser: 0.94 mg/dL (ref 0.44–1.00)
GFR calc Af Amer: >60 mL/min (ref >60)
GLUCOSE: 205 mg/dL — AB (ref 65–99)
POTASSIUM: 4.1 mmol/L (ref 3.5–5.1)
Sodium: 127 mmol/L — ABNORMAL LOW (ref 135–145)

## 2017-02-12 LAB — HEPATIC FUNCTION PANEL
ALT: 33 U/L (ref 14–54)
AST: 39 U/L (ref 15–41)
Albumin: 3.2 g/dL — ABNORMAL LOW (ref 3.5–5.0)
Alkaline Phosphatase: 75 U/L (ref 38–126)
Bilirubin, Direct: 0.2 mg/dL (ref 0.1–0.5)
Indirect Bilirubin: 0.5 mg/dL (ref 0.3–0.9)
Total Bilirubin: 0.7 mg/dL (ref 0.3–1.2)
Total Protein: 6 g/dL — ABNORMAL LOW (ref 6.5–8.1)

## 2017-02-12 LAB — CBG MONITORING, ED
GLUCOSE-CAPILLARY: 184 mg/dL — AB (ref 65–99)
Glucose-Capillary: 196 mg/dL — ABNORMAL HIGH (ref 65–99)

## 2017-02-12 LAB — GLUCOSE, CAPILLARY: GLUCOSE-CAPILLARY: 202 mg/dL — AB (ref 65–99)

## 2017-02-12 LAB — MAGNESIUM: MAGNESIUM: 1.5 mg/dL — AB (ref 1.7–2.4)

## 2017-02-12 LAB — BRAIN NATRIURETIC PEPTIDE: B Natriuretic Peptide: 412.7 pg/mL — ABNORMAL HIGH (ref 0.0–100.0)

## 2017-02-12 LAB — PHOSPHORUS: Phosphorus: 2.5 mg/dL (ref 2.5–4.6)

## 2017-02-12 LAB — ETHANOL: Alcohol, Ethyl (B): <5 mg/dL (ref <5)

## 2017-02-12 MED ORDER — ENOXAPARIN SODIUM 30 MG/0.3ML ~~LOC~~ SOLN
30.0000 mg | SUBCUTANEOUS | Status: DC
  Administered 2017-02-13: 30 mg via SUBCUTANEOUS
  Filled 2017-02-12: qty 0.3

## 2017-02-12 MED ORDER — LORAZEPAM 2 MG/ML IJ SOLN: 0.0000 mg | Freq: Two times a day (BID) | INTRAMUSCULAR | Status: DC

## 2017-02-12 MED ORDER — INSULIN ASPART 100 UNIT/ML ~~LOC~~ SOLN
0.0000 [IU] | Freq: Every day | SUBCUTANEOUS | Status: DC
  Administered 2017-02-15: 2 [IU] via SUBCUTANEOUS

## 2017-02-12 MED ORDER — ACETAMINOPHEN 325 MG PO TABS
650.0000 mg | ORAL_TABLET | Freq: Four times a day (QID) | ORAL | Status: DC | PRN
  Administered 2017-02-13 – 2017-02-17 (×8): 650 mg via ORAL
  Filled 2017-02-12 (×9): qty 2

## 2017-02-12 MED ORDER — INSULIN ASPART 100 UNIT/ML ~~LOC~~ SOLN
0.0000 [IU] | Freq: Three times a day (TID) | SUBCUTANEOUS | Status: DC
  Administered 2017-02-13 (×2): 2 [IU] via SUBCUTANEOUS
  Administered 2017-02-13: 1 [IU] via SUBCUTANEOUS
  Administered 2017-02-14: 3 [IU] via SUBCUTANEOUS
  Administered 2017-02-14 – 2017-02-15 (×3): 2 [IU] via SUBCUTANEOUS
  Administered 2017-02-15: 9 [IU] via SUBCUTANEOUS
  Administered 2017-02-15 – 2017-02-16 (×2): 7 [IU] via SUBCUTANEOUS
  Administered 2017-02-16: 2 [IU] via SUBCUTANEOUS
  Administered 2017-02-16: 3 [IU] via SUBCUTANEOUS
  Administered 2017-02-17: 1 [IU] via SUBCUTANEOUS

## 2017-02-12 MED ORDER — GABAPENTIN 300 MG PO CAPS
300.0000 mg | ORAL_CAPSULE | Freq: Two times a day (BID) | ORAL | Status: DC
  Administered 2017-02-12 – 2017-02-17 (×10): 300 mg via ORAL
  Filled 2017-02-12 (×10): qty 1

## 2017-02-12 MED ORDER — MELOXICAM 15 MG PO TABS
15.0000 mg | ORAL_TABLET | Freq: Every day | ORAL | Status: DC
  Administered 2017-02-12: 15 mg via ORAL
  Filled 2017-02-12: qty 1

## 2017-02-12 MED ORDER — DEXAMETHASONE 4 MG PO TABS: 10.0000 mg | ORAL_TABLET | Freq: Once | ORAL | Status: DC

## 2017-02-12 MED ORDER — SODIUM CHLORIDE 0.9 % IV SOLN
INTRAVENOUS | Status: DC
  Administered 2017-02-12 – 2017-02-13 (×3): via INTRAVENOUS

## 2017-02-12 MED ORDER — CLONIDINE HCL 0.1 MG PO TABS
0.1000 mg | ORAL_TABLET | Freq: Once | ORAL | Status: AC
  Administered 2017-02-12: 0.1 mg via ORAL
  Filled 2017-02-12: qty 1

## 2017-02-12 MED ORDER — LORAZEPAM 2 MG/ML IJ SOLN
0.0000 mg | Freq: Two times a day (BID) | INTRAMUSCULAR | Status: AC
  Administered 2017-02-14: 2 mg via INTRAVENOUS
  Filled 2017-02-12: qty 1

## 2017-02-12 MED ORDER — PANTOPRAZOLE SODIUM 40 MG PO TBEC
40.0000 mg | DELAYED_RELEASE_TABLET | Freq: Every day | ORAL | Status: DC
  Administered 2017-02-12 – 2017-02-17 (×6): 40 mg via ORAL
  Filled 2017-02-12 (×6): qty 1

## 2017-02-12 MED ORDER — HYDRALAZINE HCL 20 MG/ML IJ SOLN
10.0000 mg | Freq: Four times a day (QID) | INTRAMUSCULAR | Status: DC | PRN
  Administered 2017-02-12 – 2017-02-14 (×3): 10 mg via INTRAVENOUS
  Filled 2017-02-12 (×3): qty 1

## 2017-02-12 MED ORDER — LEVETIRACETAM 500 MG PO TABS
500.0000 mg | ORAL_TABLET | Freq: Two times a day (BID) | ORAL | Status: DC
  Administered 2017-02-12 – 2017-02-17 (×10): 500 mg via ORAL
  Filled 2017-02-12 (×10): qty 1

## 2017-02-12 MED ORDER — LORAZEPAM 2 MG/ML IJ SOLN
0.0000 mg | Freq: Four times a day (QID) | INTRAMUSCULAR | Status: AC
Start: 2017-02-13 — End: 2017-02-14
  Administered 2017-02-13 (×3): 1 mg via INTRAVENOUS
  Filled 2017-02-12 (×3): qty 1

## 2017-02-12 MED ORDER — LORAZEPAM 1 MG PO TABS: 1.0000 mg | ORAL_TABLET | Freq: Four times a day (QID) | ORAL | Status: AC | PRN

## 2017-02-12 MED ORDER — CLONIDINE HCL 0.1 MG PO TABS
0.1000 mg | ORAL_TABLET | Freq: Every day | ORAL | Status: DC
  Administered 2017-02-13 – 2017-02-14 (×2): 0.1 mg via ORAL
  Filled 2017-02-12 (×2): qty 1

## 2017-02-12 MED ORDER — PANTOPRAZOLE SODIUM 40 MG IV SOLR: 40.0000 mg | Freq: Two times a day (BID) | INTRAVENOUS | Status: DC

## 2017-02-12 MED ORDER — ADULT MULTIVITAMIN W/MINERALS CH
1.0000 | ORAL_TABLET | Freq: Every day | ORAL | Status: DC
  Administered 2017-02-13 – 2017-02-17 (×5): 1 via ORAL
  Filled 2017-02-12 (×5): qty 1

## 2017-02-12 MED ORDER — LORAZEPAM 2 MG/ML IJ SOLN
0.0000 mg | Freq: Four times a day (QID) | INTRAMUSCULAR | Status: DC
  Administered 2017-02-12: 2 mg via INTRAVENOUS
  Filled 2017-02-12: qty 1

## 2017-02-12 MED ORDER — ONDANSETRON HCL 4 MG/2ML IJ SOLN: 4.0000 mg | Freq: Four times a day (QID) | INTRAMUSCULAR | Status: DC | PRN

## 2017-02-12 MED ORDER — DULOXETINE HCL 30 MG PO CPEP
30.0000 mg | ORAL_CAPSULE | Freq: Every day | ORAL | Status: DC
  Administered 2017-02-13 – 2017-02-17 (×5): 30 mg via ORAL
  Filled 2017-02-12 (×5): qty 1

## 2017-02-12 MED ORDER — FOLIC ACID 1 MG PO TABS
1.0000 mg | ORAL_TABLET | Freq: Every day | ORAL | Status: DC
  Administered 2017-02-13 – 2017-02-17 (×5): 1 mg via ORAL
  Filled 2017-02-12 (×5): qty 1

## 2017-02-12 MED ORDER — DEXAMETHASONE 4 MG PO TABS
4.0000 mg | ORAL_TABLET | Freq: Once | ORAL | Status: AC
  Administered 2017-02-12: 4 mg via ORAL
  Filled 2017-02-12: qty 1

## 2017-02-12 MED ORDER — ONDANSETRON HCL 4 MG PO TABS: 4.0000 mg | ORAL_TABLET | Freq: Four times a day (QID) | ORAL | Status: DC | PRN

## 2017-02-12 MED ORDER — SODIUM CHLORIDE 0.9% FLUSH
3.0000 mL | Freq: Two times a day (BID) | INTRAVENOUS | Status: DC
  Administered 2017-02-12 – 2017-02-17 (×10): 3 mL via INTRAVENOUS

## 2017-02-12 MED ORDER — KETOROLAC TROMETHAMINE 15 MG/ML IJ SOLN
15.0000 mg | Freq: Four times a day (QID) | INTRAMUSCULAR | Status: DC
  Administered 2017-02-12 – 2017-02-16 (×17): 15 mg via INTRAVENOUS
  Filled 2017-02-12 (×17): qty 1

## 2017-02-12 MED ORDER — SODIUM CHLORIDE 0.9 % IV SOLN
500.0000 mg | Freq: Two times a day (BID) | INTRAVENOUS | Status: DC
  Administered 2017-02-12: 500 mg via INTRAVENOUS
  Filled 2017-02-12 (×2): qty 500

## 2017-02-12 MED ORDER — LORAZEPAM 2 MG/ML IJ SOLN: 1.0000 mg | Freq: Four times a day (QID) | INTRAMUSCULAR | Status: AC | PRN

## 2017-02-12 MED ORDER — VITAMIN B-1 100 MG PO TABS
100.0000 mg | ORAL_TABLET | Freq: Every day | ORAL | Status: DC
  Administered 2017-02-13: 100 mg via ORAL
  Filled 2017-02-12: qty 1

## 2017-02-12 MED ORDER — DEXAMETHASONE SODIUM PHOSPHATE 4 MG/ML IJ SOLN
4.0000 mg | INTRAMUSCULAR | Status: DC
  Administered 2017-02-12 – 2017-02-15 (×14): 4 mg via INTRAVENOUS
  Filled 2017-02-12 (×15): qty 1

## 2017-02-12 MED ORDER — CARVEDILOL 12.5 MG PO TABS: 6.2500 mg | ORAL_TABLET | Freq: Two times a day (BID) | ORAL | Status: DC

## 2017-02-12 MED ORDER — ACETAMINOPHEN 650 MG RE SUPP: 650.0000 mg | Freq: Four times a day (QID) | RECTAL | Status: DC | PRN

## 2017-02-12 MED ORDER — THIAMINE HCL 100 MG/ML IJ SOLN
100.0000 mg | Freq: Every day | INTRAMUSCULAR | Status: DC
  Filled 2017-02-12: qty 2

## 2017-02-12 MED ORDER — DEXTROSE 5 % IV SOLN
1.0000 g | Freq: Once | INTRAVENOUS | Status: AC
  Administered 2017-02-12: 1 g via INTRAVENOUS
  Filled 2017-02-12: qty 1

## 2017-02-12 NOTE — Progress Notes (Signed)
Pharmacy Antibiotic Note  Phyllis [name] is a 61 y.o. female admitted on 02/12/2017 with pneumonia.  Pharmacy has been consulted for vancomycin dosing.  Patient was discharged yesterday after a 3 day stay for encephalopathy presumed from opiate diversion. Did not meet criteria for psych admission. Was not on antibiotics during that admission.  SCr stable today at 0.94. WBC nml, afeb in ED.  Plan: Vancomycin 555m  IV q12h. Goal trough 15-20 mcg/mL. Cefepime 1g IV x1 ordered by EDP- follow for continuation of orders Follow c/s, clinical progression, renal function, trough PRN  Height: _0  (162.6 cm) Weight: 130 lb (59 kg) IBW/kg (Calculated) : 54.7  Temp (24hrs), Avg:97.5 F (36.4 C), Min:97.5 F (36.4 C), Max:97.5 F (36.4 C)   Recent Labs Lab 02/08/17 1912 02/09/17 0510 02/10/17 1210 02/12/17 1210 02/12/17 1222 02/12/17 1331  WBC 6.9 3.7*  --  6.1  --   --   CREATININE 1.10* 0.92 0.94  --  0.90 0.94    Estimated Creatinine Clearance: 53.6 mL/min (by C-G formula based on SCr of 0.94 mg/dL).    Allergies  Allergen Reactions  . Amlodipine Swelling  . Meperidine Hcl Other (See Comments)    hallucinations  . Naproxen Other (See Comments)    Extreme bruising    Antimicrobials this admission: vancomycin 5/12 >>  Cefepime 5/12 >>   Dose adjustments this admission: n/a  Microbiology results: None sent yet  Thank you for allowing pharmacy to be a part of this patient's care.  Aliyha Fornes D. Angelic Schnelle, PharmD, BCPS Clinical Pharmacist Pager: [phone]/09/2017 4:03 PM

## 2017-02-12 NOTE — ED Triage Notes (Signed)
To ED for eval of sob and left side HA for past 4-5 days. Pt and family state pt was recently released from [facility] after treatment of lung cancer and SDH with surgery. Pt states she can't remember anything, hurting all over, and just feels bad. Speaks clearly. No seizure activity noted. No vomiting.

## 2017-02-12 NOTE — ED Notes (Signed)
PT did not have to use RR at this time  

## 2017-02-12 NOTE — ED Notes (Signed)
Pt more confused after ativan administration. Erin Hearing, NP paged and made aware

## 2017-02-12 NOTE — ED Notes (Signed)
Patient's husband at bedside requested to speak with this RN outside of the room, pts husband stated that he is concerned about patient over using oxycodone, states she gets a 30 day supply and finishes them in 4-5 days, reports pt has been "talking out of her head and destroying the house." states pt has not had any oxycodone in about 1 week.
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
## 2017-02-12 NOTE — ED Notes (Signed)
Erin Hearing, NP responded to page regarding patient's confusion after ativan. NP Lissa Merlin advised pt can get haldol if needed for combativeness.

## 2017-02-12 NOTE — ED Notes (Signed)
pts husband napoleon left his number for contact: [phone]

## 2017-02-12 NOTE — ED Notes (Signed)
Patient transported to X-ray 
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
## 2017-02-13 DIAGNOSIS — J449 Chronic obstructive pulmonary disease, unspecified: Secondary | ICD-10-CM

## 2017-02-13 DIAGNOSIS — C7931 Secondary malignant neoplasm of brain: Secondary | ICD-10-CM

## 2017-02-13 DIAGNOSIS — R739 Hyperglycemia, unspecified: Secondary | ICD-10-CM

## 2017-02-13 DIAGNOSIS — F19939 Other psychoactive substance use, unspecified with withdrawal, unspecified: Secondary | ICD-10-CM

## 2017-02-13 DIAGNOSIS — F1193 Opioid use, unspecified with withdrawal: Secondary | ICD-10-CM

## 2017-02-13 DIAGNOSIS — F319 Bipolar disorder, unspecified: Secondary | ICD-10-CM

## 2017-02-13 DIAGNOSIS — R938 Abnormal findings on diagnostic imaging of other specified body structures: Secondary | ICD-10-CM

## 2017-02-13 DIAGNOSIS — E43 Unspecified severe protein-calorie malnutrition: Secondary | ICD-10-CM

## 2017-02-13 DIAGNOSIS — Z87891 Personal history of nicotine dependence: Secondary | ICD-10-CM

## 2017-02-13 LAB — COMPREHENSIVE METABOLIC PANEL WITH GFR
ALT: 32 U/L (ref 14–54)
AST: 31 U/L (ref 15–41)
Albumin: 2.9 g/dL — ABNORMAL LOW (ref 3.5–5.0)
Alkaline Phosphatase: 66 U/L (ref 38–126)
Anion gap: 9 (ref 5–15)
BUN: 19 mg/dL (ref 6–20)
CO2: 23 mmol/L (ref 22–32)
Calcium: 8.4 mg/dL — ABNORMAL LOW (ref 8.9–10.3)
Chloride: 94 mmol/L — ABNORMAL LOW (ref 101–111)
Creatinine, Ser: 0.73 mg/dL (ref 0.44–1.00)
GFR calc Af Amer: >60 mL/min (ref >60)
GFR calc non Af Amer: >60 mL/min (ref >60)
Glucose, Bld: 181 mg/dL — ABNORMAL HIGH (ref 65–99)
Potassium: 3.5 mmol/L (ref 3.5–5.1)
Sodium: 126 mmol/L — ABNORMAL LOW (ref 135–145)
Total Bilirubin: 0.6 mg/dL (ref 0.3–1.2)
Total Protein: 5.4 g/dL — ABNORMAL LOW (ref 6.5–8.1)

## 2017-02-13 LAB — RAPID URINE DRUG SCREEN, HOSP PERFORMED
AMPHETAMINES: NOT DETECTED
Barbiturates: NOT DETECTED
Benzodiazepines: NOT DETECTED
COCAINE: NOT DETECTED
OPIATES: NOT DETECTED
TETRAHYDROCANNABINOL: NOT DETECTED

## 2017-02-13 LAB — GLUCOSE, CAPILLARY
GLUCOSE-CAPILLARY: 166 mg/dL — AB (ref 65–99)
Glucose-Capillary: 152 mg/dL — ABNORMAL HIGH (ref 65–99)
Glucose-Capillary: 144 mg/dL — ABNORMAL HIGH (ref 65–99)
Glucose-Capillary: 116 mg/dL — ABNORMAL HIGH (ref 65–99)

## 2017-02-13 LAB — CBC
HCT: 45.1 % (ref 36.0–46.0)
Hemoglobin: 15.7 g/dL — ABNORMAL HIGH (ref 12.0–15.0)
MCH: 31.3 pg (ref 26.0–34.0)
MCHC: 34.8 g/dL (ref 30.0–36.0)
MCV: 89.8 fL (ref 78.0–100.0)
Platelets: 217 K/uL (ref 150–400)
RBC: 5.02 MIL/uL (ref 3.87–5.11)
RDW: 13.1 % (ref 11.5–15.5)
WBC: 6.1 K/uL (ref 4.0–10.5)

## 2017-02-13 LAB — URINALYSIS, ROUTINE W REFLEX MICROSCOPIC
Bilirubin Urine: NEGATIVE
Glucose, UA: 50 mg/dL — AB
HGB URINE DIPSTICK: NEGATIVE
Ketones, ur: NEGATIVE mg/dL
Leukocytes, UA: NEGATIVE
NITRITE: NEGATIVE
PROTEIN: NEGATIVE mg/dL
Specific Gravity, Urine: 1.01 (ref 1.005–1.030)
pH: 7 (ref 5.0–8.0)

## 2017-02-13 LAB — OSMOLALITY, URINE: Osmolality, Ur: 631 mOsm/kg (ref 300–900)

## 2017-02-13 LAB — SODIUM, URINE, RANDOM: Sodium, Ur: 134 mmol/L

## 2017-02-13 LAB — OSMOLALITY: Osmolality: 273 mOsm/kg — ABNORMAL LOW (ref 275–295)

## 2017-02-13 MED ORDER — CARVEDILOL 3.125 MG PO TABS
3.1250 mg | ORAL_TABLET | Freq: Two times a day (BID) | ORAL | Status: DC
  Administered 2017-02-13 – 2017-02-17 (×8): 3.125 mg via ORAL
  Filled 2017-02-13 (×8): qty 1

## 2017-02-13 MED ORDER — VITAMIN B-1 100 MG PO TABS
100.0000 mg | ORAL_TABLET | Freq: Every day | ORAL | Status: DC
  Administered 2017-02-14 – 2017-02-17 (×4): 100 mg via ORAL
  Filled 2017-02-13 (×4): qty 1

## 2017-02-13 NOTE — Plan of Care (Signed)
Problem: Education: Goal: Knowledge of [facility] Education information/materials will improve Outcome: Progressing Discussed with patient about infection prevention with foley care with some teach back displayed
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
## 2017-02-13 NOTE — Plan of Care (Signed)
Problem: Education: Goal: Knowledge of [facility] Education information/materials will improve Outcome: Progressing Discussed with patient and husband about plan of care and withdrawal signs and symptoms with some teach back displayed

## 2017-02-13 NOTE — Progress Notes (Signed)
  Radiation Oncology         [phone] ________________________________  Name: Phyllis [name] MRN: [id number]  Date: 02/11/2017  DOB: 1955-11-09  INPATIENT  SIMULATION AND TREATMENT PLANNING NOTE    ICD-9-CM ICD-10-CM   1. 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  Identity was confirmed.  All relevant records and images related to the planned course of therapy were reviewed.  The patient freely provided informed written consent to proceed with treatment after reviewing the details related to the planned course of therapy. The consent form was witnessed and verified by the simulation staff. Intravenous access was established for contrast administration. Then, the patient was set-up in a stable reproducible supine position for radiation therapy.  A relocatable thermoplastic stereotactic head frame was fabricated for precise immobilization.  CT images were obtained.  Surface markings were placed.  The CT images were loaded into the planning software and fused with the patient's targeting MRI scan.  Then the target and avoidance structures were contoured.  Treatment planning then occurred.  The radiation prescription was entered and confirmed.  I have requested 3D planning  I have requested a DVH of the following structures: Brain stem, brain, left eye, right eye, lenses, optic chiasm, target volumes, uninvolved brain, and normal tissue.    SPECIAL TREATMENT PROCEDURE:  The planned course of therapy using radiation constitutes a special treatment procedure. Special care is required in the management of this patient for the following reasons. This treatment constitutes a Special Treatment Procedure for the following reason: High dose per fraction requiring special monitoring for increased  toxicities of treatment including daily imaging.  The special nature of the planned course of radiotherapy will require increased physician supervision and oversight to ensure patient's safety with optimal treatment outcomes.  PLAN:  The patient will receive 27 Gy in 3 fractions.  ________________________________  Sheral Apley Tammi Klippel, M.D.

## 2017-02-13 NOTE — Progress Notes (Signed)
[facility]                                                                              Patient Demographics  Phyllis [name], is a 61 y.o. female, DOB - June 15, 1956, KGY:[id number]  Admit date - 02/12/2017   Admitting Physician Elwin Mocha, MD  Outpatient Primary MD for the patient is [name], MD  Outpatient specialists:   LOS - 1  days    Chief Complaint  Patient presents with  . Shortness of Breath       Brief summary   Patient is a 61 year old female with hypertension, chronic pain scan due to osteoarthritis, bipolar disorder, chronic diastolic CHF, COPD, dyslipidemia, chronic pancreatitis, non-small cell lung cancer diagnosed in 2017. Patient was admitted from 5/8- 5/11 at [facility] for metabolic encephalopathy likely due to opiate misuse and taking excessively for chronic pain and depression. (Per husband, possibly buying from street). On DC summary 5/11, patient was not given any further prescriptions  for narcotics. Patient presented back to [facility] ED on 5/12 with multiple complaints including shortness of breath, headache, "hurting all over" and aggressive behavior. She has a history of lung cancer with metastases to brain. Chest x-ray revealed possible infiltrate in the left lower lobe. Patient was admitted to hospitalist service for further workup and observation. BP 172/97, she was admitted for possible HCAP  Assessment & Plan    Principal Problem:   Hypertensive urgency - Presented with hypertensive urgency in the setting of noncompliance with medications and possible polysubstance withdrawal - Continue low-dose Coreg and Catapres, BP currently improving  Active Problems:   Withdrawal syndrome (opiods and BZDs) with underlying history of bipolar disorder - Continue clonidine, Toradol,  Ultram for pain control -Psychiatry has been consulted - Seizure precautions     Non-small cell carcinoma of lung/Brain metastases    -MRI brain on 5/10 has revealed increased size of mass -Patient noncompliant with Decadron prior to admission, continue IV Decadron  -During previous admission attending physician discussed with Dr. Tammi Klippel (radiation oncology) and plans were to begin XRT during the week of 5/14, will discuss with Dr. Tammi Klippel in a.m. 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 -Likely related to acute stressors as well as utilization of steroids -Hemoglobin A1c was 7.4 on 5/8 -will low dose oral agent on dc, given patient will be on Decadron - Continue sliding scale insulin    COPD (chronic obstructive pulmonary disease)  -Currently asymptomatic without wheezing    Chronic diastolic heart failure,  NYHA class 1  -Currently compensated but monitor closely with volume resuscitation  Urinary retention - PVR over 400, place Foley catheter, UA negative for UTI    Code Status: full code  DVT Prophylaxis:  Lovenox  Family Communication: Discussed in detail with the patient, all imaging results, lab results explained to the patient Disposition Plan:   Time Spent in minutes   25 minutes  Procedures:  CT chest  Consultants:   None  Antimicrobials:   Vancomycin IV 1 dose  Cefepime IV 1 dose   Medications  Scheduled Meds: .  carvedilol  3.125 mg Oral BID WC  . cloNIDine  0.1 mg Oral Daily  . dexamethasone  4 mg Intravenous Q4H  . DULoxetine  30 mg Oral Daily  . enoxaparin (LOVENOX) injection  30 mg Subcutaneous Q24H  . folic acid  1 mg Oral Daily  . gabapentin  300 mg Oral BID  . insulin aspart  0-5 Units Subcutaneous QHS  . insulin aspart  0-9 Units Subcutaneous TID WC  . ketorolac  15 mg Intravenous Q6H  . levETIRAcetam  500 mg Oral BID  . LORazepam  0-4 mg Intravenous Q6H   Followed by  . [START ON 02/14/2017] LORazepam  0-4 mg Intravenous Q12H  . multivitamin with minerals  1 tablet Oral Daily  . pantoprazole  40 mg Oral Daily  . sodium chloride flush  3 mL Intravenous Q12H  . thiamine  100 mg Oral Daily   Or  . thiamine  100 mg Intravenous Daily   Continuous Infusions: . sodium chloride 150 mL/hr at 02/13/17 0607   PRN Meds:.acetaminophen **OR** acetaminophen, hydrALAZINE, LORazepam **OR** LORazepam, ondansetron **OR** ondansetron (ZOFRAN) IV   Antibiotics   Anti-infectives    Start     Dose/Rate Route Frequency Ordered Stop   02/12/17 1630  vancomycin (VANCOCIN) 500 mg in sodium chloride 0.9 % 100 mL IVPB  Status:  Discontinued     500 mg 100 mL/hr over 60 Minutes Intravenous Every 12 hours 02/12/17 1604 02/12/17 1821   02/12/17 1600  ceFEPIme (MAXIPIME) 1 g in dextrose 5 % 50 mL IVPB     1 g 100 mL/hr over 30 Minutes Intravenous  Once 02/12/17 1540 02/12/17 1802        Subjective:   Phyllis [name] was seen and examined today. Feeling better today. Per RN, has urinary retention, over 400 mL on PVR. Complaining of pain in the legs however does not appear to be in any pain.  Patient denies dizziness, chest pain, shortness of breath, abdominal pain, N/V/D/C, new weakness, numbess, tingling. No acute events overnight.  Afebrile.  Objective:   Vitals:   02/13/17 0605 02/13/17 0700 02/13/17 0813 02/13/17 1030  BP: (!) 183/94 129/75 127/75 139/72  Pulse:  77 82 78  Resp:  _0 Temp:   98.8 F (37.1 C)   TempSrc:   Oral   SpO2:  (!) 89% 90% 95%  Weight:      Height:        Intake/Output Summary (Last 24 hours) at 02/13/17 1151 Last data filed at 02/13/17 1106  Gross per 24 hour  Intake           2115.5 ml  Output              800 ml  Net           1315.5 ml     Wt Readings from Last 3 Encounters:  02/13/17 57.6 kg (  126 lb 15.8 oz)  02/11/17 52.3 kg (115 lb 4.8 oz)  01/19/17 51.3 kg (113 lb)     Exam  General: Alert and oriented x 2, NADA cachectic   HEENT:  PERRLA, EOMI, Anicteric Sclera, mucous membranes moist.   Neck: Supple, no JVD, no masses  Cardiovascular: S1 S2 auscultated, no rubs, murmurs or gallops. Regular rate and rhythm.  Respiratory: Clear to auscultation bilaterally, no wheezing, rales or rhonchi  Gastrointestinal: Soft, nontender, nondistended, + bowel sounds  Ext: no cyanosis clubbing or edema  Neuro: AAOx3, Cr N's II- XII. Strength 5/5 upper and lower extremities bilaterally  Skin: No rashes  Psych:flat affect, alert and oriented x32    Data Reviewed:  I have personally reviewed following labs and imaging studies  Micro Results No results found for this or any previous visit (from the past 240 hour(s)).  Radiology Reports Dg Chest 2 View  Result Date: 02/12/2017 CLINICAL DATA:  Acute chest pain and shortness of breath. EXAM: CHEST  2 VIEW COMPARISON:  01/11/2017 chest radiograph and prior studies FINDINGS: The cardiomediastinal silhouette is unremarkable. COPD/emphysema noted. Streaky lingular opacity is noted and may represent atelectasis or mild airspace disease/ pneumonia. There is no evidence of pulmonary edema, suspicious pulmonary nodule/mass, pleural effusion, or pneumothorax. No acute bony abnormalities are identified. IMPRESSION: Streaky lingular opacity - question mild atelectasis or mild airspace disease/ pneumonia. COPD/emphysema. Electronically Signed   By: Margarette Canada M.D.   On: 02/12/2017 15:18   Ct  Head Wo Contrast  Result Date: 02/08/2017 CLINICAL DATA:  Recent brain surgery. Hallucinations. Poorly differentiated non-small cell lung cancer. EXAM: CT HEAD WITHOUT CONTRAST TECHNIQUE: Contiguous axial images were obtained from the base of the skull through the vertex without intravenous contrast. COMPARISON:  CT head 01/13/2017.  MR head 01/11/2017. FINDINGS: Brain: Status post LEFT parietal burr hole for stereotactic biopsy of a LEFT hemisphere mass, with subsequent diagnosis of poorly differentiated non small cell lung cancer. The mass remains hyperattenuating on noncontrast exam, but is not frankly hemorrhagic. There is mild associated vasogenic edema. There may be slight interval growth from priors, up to 3 cm long axis as compared to 2.2 cm previously, without LEFT-to-RIGHT shift. Mild mass effect on the LEFT lateral ventricle. No other mass lesions are identified on this noncontrast exam. Vascular: No hyperdense vessel or unexpected calcification. Skull: Aside from the burr hole, unremarkable. Surgical skin staples have not been removed. Sinuses/Orbits: Unremarkable. Other: None. IMPRESSION: Suspected interval growth of the hyperattenuating LEFT parietal periventricular intracranial metastasis, with mild surrounding edema but no apparent acute findings. No postsurgical complication is evident. Electronically Signed   By: Staci Righter M.D.   On: 02/08/2017 20:19   Ct Chest Wo Contrast  Result Date: 02/12/2017 CLINICAL DATA:  Follow-up Healthcare acquired pneumonia EXAM: CT CHEST WITHOUT CONTRAST TECHNIQUE: Multidetector CT imaging of the chest was performed following the standard protocol without IV contrast. COMPARISON:  Chest radiographs dated 02/12/2017. CT chest dated 11/18/2016. FINDINGS: Cardiovascular: The heart is top-normal in size. Trace pericardial effusion. Three vessel coronary atherosclerosis. No evidence of thoracic aortic aneurysm. Atherosclerotic calcifications of the aortic arch.  Mediastinum/Nodes: 2.1 cm short axis node along the right azygoesophageal recess (series 3/image 37), previously 1.9 cm. Bilateral hilar regions are poorly evaluated in the absence intravenous contrast administration. No suspicious axillary lymphadenopathy. Visualized right thyroid is unremarkable. Left thyroid is not discretely visualized. Lungs/Pleura: Moderate centrilobular and paraseptal emphysematous changes, upper lobe predominant. Left lower lobe pulmonary sequestration. 1.5 x 1.8 cm left lower  lobe nodule, grossly unchanged. This corresponds to the patient's known primary bronchogenic neoplasm status post stereotactic radiation. Additional branching mucous plugging in the posterior left lower lobe (series 4/images 108-112). Mild nodular scarring in the posterior right lower lobe (series 4/ image 87), improved. Mild lingular scarring with bronchiectasis (series 4/ image 92). No superimposed opacities suspicious for pneumonia. No pleural effusion or pneumothorax. Upper Abdomen: Cirrhosis. Status post cholecystectomy. Sequela of prior/ chronic pancreatitis. Mild nodular thickening of the bilateral adrenal glands without discrete mass. Mild thickening of the proximal stomach, although favoring underdistention. Musculoskeletal: Mild superior endplate changes at Z6-1. IMPRESSION: No findings suspicious for pneumonia. 1.5 x 1.8 cm left lower lobe nodule, grossly unchanged, corresponding to the patient's known primary bronchogenic neoplasm status post stereotactic radiation. 2.1 cm short axis node along the right azygoesophageal recess, suspicious for nodal metastasis, mildly increased. Prior posterior right lower lobe nodule/neoplasm is improved, with minimal residual nodular scarring. Additional ancillary findings as above. Electronically Signed   By: Julian Hy M.D.   On: 02/12/2017 19:42   Mr Jeri Cos WR Contrast  Result Date: 02/10/2017 CLINICAL DATA:  Brain mass status post craniotomy. EXAM: MRI HEAD  WITHOUT AND WITH CONTRAST TECHNIQUE: Multiplanar, multiecho pulse sequences of the brain and surrounding structures were obtained without and with intravenous contrast. CONTRAST:  22m MULTIHANCE GADOBENATE DIMEGLUMINE 529 MG/ML IV SOLN COMPARISON:  Head CT 02/08/2017 Brain MRI 01/11/2017 FINDINGS: Brain: The midline structures are normal. No focal diffusion restriction to indicate acute infarct. No intraparenchymal hemorrhage. The contrast-enhancing mass within the periatrial left parietal white matter, involving the left aspect of the splenium of the corpus callosum, now measures 3.5 x 2.4 cm, previously 3.1 x 1.8 cm. The degree of associated hyperintense T2 weighted signal is also increased. Diffusion-weighted imaging shows likely involvement of the contralateral corpus callosum splenium. There is mass effect on the atrium of the left lateral ventricle, but no midline shift. There are no new contrast-enhancing lesions. Multiple round low T1, high T2 weighted signal foci within the periventricular and deep white matter are unchanged. There is hemosiderin again seen within the mass. No hydrocephalus. Vascular: Major intracranial arterial and venous sinus flow voids are preserved. Skull and upper cervical spine: Left parietal burr hole. Sinuses/Orbits: No fluid levels or advanced mucosal thickening. No mastoid effusion. Normal orbits. IMPRESSION: 1. Increased size of mass located in the left parietal periatrial white matter, involving the splenium of the corpus callosum. Biopsy results show metastatic lung carcinoma. 2. Increased T2 weighted signal hyperintensity surrounding the lesion, likely a combination of edema related to tumor growth and recent biopsy. 3. Unchanged appearance of multiple round foci of signal abnormality without contrast enhancement, which may indicate chronic demyelinating lesions. Electronically Signed   By: KUlyses JarredM.D.   On: 02/10/2017 20:11    Lab Data:  CBC:  Recent Labs Lab  02/08/17 1912 02/09/17 0510 02/12/17 1210 02/12/17 1222 02/13/17 0304  WBC 6.9 3.7* 6.1  --  6.1  NEUTROABS 4.8 2.9  --   --   --   HGB 17.8* 15.2* 15.9* 17.3* 15.7*  HCT 50.3* 44.7 46.6* 51.0* 45.1  MCV 90.8 90.7 91.4  --  89.8  PLT 287 207 249  --  2604  Basic Metabolic Panel:  Recent Labs Lab 02/08/17 1912 02/09/17 0510 02/10/17 1210 02/12/17 1222 02/12/17 1331 02/12/17 2224 02/13/17 0304  NA 128* 130* 128* 129* 127*  --  126*  K 3.2* 4.0 3.9 4.0 4.1  --  3.5  CL  92* 98* 97* 96* 94*  --  94*  CO2 23 21* 23  --  25  --  23  GLUCOSE 151* 154* 161* 226* 205*  --  181*  BUN 23* 24* 25* 26* 23*  --  19  CREATININE 1.10* 0.92 0.94 0.90 0.94  --  0.73  CALCIUM 9.2 8.5* 8.8*  --  9.1  --  8.4*  MG 1.9  --   --   --   --  1.5*  --   PHOS  --   --   --   --   --  2.5  --    GFR: Estimated Creatinine Clearance: 65.6 mL/min (by C-G formula based on SCr of 0.73 mg/dL). Liver Function Tests:  Recent Labs Lab 02/08/17 1912 02/12/17 1437 02/13/17 0304  AST 27 39 31  ALT 19 33 32  ALKPHOS 96 75 66  BILITOT 1.1 0.7 0.6  PROT 7.3 6.0* 5.4*  ALBUMIN 3.9 3.2* 2.9*   No results for input(s): LIPASE, AMYLASE in the last 168 hours. No results for input(s): AMMONIA in the last 168 hours. Coagulation Profile: No results for input(s): INR, PROTIME in the last 168 hours. Cardiac Enzymes: No results for input(s): CKTOTAL, CKMB, CKMBINDEX, TROPONINI in the last 168 hours. BNP (last 3 results) No results for input(s): PROBNP in the last 8760 hours. HbA1C: No results for input(s): HGBA1C in the last 72 hours. CBG:  Recent Labs Lab 02/11/17 1216 02/12/17 1715 02/12/17 1938 02/12/17 2256 02/13/17 0820  GLUCAP 142* 196* 184* 202* 166*   Lipid Profile: No results for input(s): CHOL, HDL, LDLCALC, TRIG, CHOLHDL, LDLDIRECT in the last 72 hours. Thyroid Function Tests: No results for input(s): TSH, T4TOTAL, FREET4, T3FREE, THYROIDAB in the last 72 hours. Anemia Panel: No  results for input(s): VITAMINB12, FOLATE, FERRITIN, TIBC, IRON, RETICCTPCT in the last 72 hours. Urine analysis:    Component Value Date/Time   COLORURINE STRAW (A) 02/12/2017 0011   APPEARANCEUR CLEAR 02/12/2017 0011   LABSPEC 1.010 02/12/2017 0011   PHURINE 7.0 02/12/2017 0011   GLUCOSEU 50 (A) 02/12/2017 0011   GLUCOSEU NEGATIVE 12/16/2016 1444   HGBUR NEGATIVE 02/12/2017 0011   BILIRUBINUR NEGATIVE 02/12/2017 0011   KETONESUR NEGATIVE 02/12/2017 0011   PROTEINUR NEGATIVE 02/12/2017 0011   UROBILINOGEN 0.2 12/16/2016 1444   NITRITE NEGATIVE 02/12/2017 0011   LEUKOCYTESUR NEGATIVE 02/12/2017 0011     Thersea Manfredonia M.D. [facility] 02/13/2017, 11:51 AM  Pager: [phone] Between 7am to 7pm - call Pager - [phone]  After 7pm go to [email or web address] - password [email or web address]  Call night coverage person covering after 7pm

## 2017-02-14 ENCOUNTER — Ambulatory Visit: Payer: Medicare Other | Admitting: Radiation Oncology

## 2017-02-14 LAB — GLUCOSE, CAPILLARY
Glucose-Capillary: 168 mg/dL — ABNORMAL HIGH (ref 65–99)
Glucose-Capillary: 157 mg/dL — ABNORMAL HIGH (ref 65–99)
Glucose-Capillary: 236 mg/dL — ABNORMAL HIGH (ref 65–99)
Glucose-Capillary: 150 mg/dL — ABNORMAL HIGH (ref 65–99)

## 2017-02-14 LAB — BASIC METABOLIC PANEL
Anion gap: 9 (ref 5–15)
BUN: 25 mg/dL — AB (ref 6–20)
CO2: 23 mmol/L (ref 22–32)
Calcium: 8.8 mg/dL — ABNORMAL LOW (ref 8.9–10.3)
Chloride: 97 mmol/L — ABNORMAL LOW (ref 101–111)
Creatinine, Ser: 0.91 mg/dL (ref 0.44–1.00)
GFR calc Af Amer: >60 mL/min (ref >60)
GLUCOSE: 150 mg/dL — AB (ref 65–99)
POTASSIUM: 3.8 mmol/L (ref 3.5–5.1)
Sodium: 129 mmol/L — ABNORMAL LOW (ref 135–145)

## 2017-02-14 LAB — CBC
HCT: 46.1 % — ABNORMAL HIGH (ref 36.0–46.0)
Hemoglobin: 15.7 g/dL — ABNORMAL HIGH (ref 12.0–15.0)
MCH: 30.7 pg (ref 26.0–34.0)
MCHC: 34.1 g/dL (ref 30.0–36.0)
MCV: 90 fL (ref 78.0–100.0)
PLATELETS: 229 10*3/uL (ref 150–400)
RBC: 5.12 MIL/uL — AB (ref 3.87–5.11)
RDW: 12.8 % (ref 11.5–15.5)
WBC: 5.8 10*3/uL (ref 4.0–10.5)

## 2017-02-14 MED ORDER — ENOXAPARIN SODIUM 40 MG/0.4ML ~~LOC~~ SOLN
40.0000 mg | SUBCUTANEOUS | Status: DC
  Administered 2017-02-14 – 2017-02-17 (×4): 40 mg via SUBCUTANEOUS
  Filled 2017-02-14 (×4): qty 0.4

## 2017-02-14 MED ORDER — TRAMADOL HCL 50 MG PO TABS
50.0000 mg | ORAL_TABLET | Freq: Four times a day (QID) | ORAL | Status: DC | PRN
  Administered 2017-02-14 – 2017-02-17 (×8): 50 mg via ORAL
  Filled 2017-02-14 (×8): qty 1

## 2017-02-14 NOTE — Evaluation (Addendum)
Physical Therapy Evaluation Patient Details Name: Phyllis [name] MRN: [id number] DOB: 04/08/1956 Today's Date: 02/14/2017   History of Present Illness  Phyllis [name] is a 61 y.o. female patient admitted with presentation of  paranoia, intermittent confusion, hypertensive urgency and severe headache consistent with withdrawal syndrome related to multiple medications including benzodiazepines, narcotics, Cymbalta, and Keppra. Patient now under detoxification for long-term utilization of oxycodone (since 2013) and Xanax (since 2013/2014).  Also found to have possible lung infiltrate on CXR due to HCAP.  Positive medical history significant for non-small cell lung cancer with intracranial metastasis status post radiation, hypertension, depression with anxiety.    Clinical Impression  Patient presents with decreased mobility due to weakness, limited activity tolerance, prolonged bedrest and poor safety awareness with recent AMS and fall.  Feel she will need skilled PT in the acute setting to allow return home with family support and follow up HHPT.     Follow Up Recommendations Home health PT;Supervision/Assistance - 24 hour    Equipment Recommendations  None recommended by PT    Recommendations for Other Services       Precautions / Restrictions Precautions Precautions: Fall;Other (comment) (seizure) Precaution Comments: reports recent fall Restrictions Weight Bearing Restrictions: No      Mobility  Bed Mobility Overal bed mobility: Needs Assistance Bed Mobility: Sit to Supine;Supine to Sit     Supine to sit: Min assist;HOB elevated Sit to supine: Supervision   General bed mobility comments: encouragement needed to participate; assist to bring trunk upright; to supine, cues for positioning  Transfers Overall transfer level: Needs assistance Equipment used: Rolling walker (2 wheeled);None Transfers: Sit to/from [employer] to Stand: Min assist Stand  pivot transfers: Min assist       General transfer comment: up to chair beside bed with much encouragement, no device with min A initially, stood for hygiene due to soiled with stool and used RW for balance, then to pivot back to bed  Ambulation/Gait                Stairs            Wheelchair Mobility    Modified Rankin (Stroke Patients Only)       Balance Overall balance assessment: Needs assistance   Sitting balance-Leahy Scale: Good     Standing balance support: Bilateral upper extremity supported;No upper extremity supported Standing balance-Leahy Scale: Poor Standing balance comment: min A versus UE support for balance                             Pertinent Vitals/Pain Pain Assessment: Faces Faces Pain Scale: Hurts whole lot Pain Location: generalized and abdomen Pain Descriptors / Indicators: Discomfort;Aching Pain Intervention(s): Monitored during session;Limited activity within patient's tolerance;Patient requesting pain meds-RN notified    Home Living Family/patient expects to be discharged to:: Private residence Living Arrangements: Spouse/significant other Available Help at Discharge: Family;Available 24 hours/day Type of Home: House Home Access: Stairs to enter Entrance Stairs-Rails: None Entrance Stairs-Number of Steps: 4 Home Layout: One level Home Equipment: Grab bars - tub/shower;Walker - 2 wheels;Other (comment) (home O2)      Prior Function Level of Independence: Needs assistance   Gait / Transfers Assistance Needed: recently has been staying in bed since last Sunday per pt, but before that walked without AD           Hand Dominance   Dominant Hand: Right    Extremity/Trunk Assessment  Upper Extremity Assessment Upper Extremity Assessment: Generalized weakness    Lower Extremity Assessment Lower Extremity Assessment: Generalized weakness       Communication   Communication: No difficulties  Cognition  Arousal/Alertness: Awake/alert Behavior During Therapy: Flat affect Overall Cognitive Status: No family/caregiver present to determine baseline cognitive functioning                                 General Comments: limited responses to questions. does reports she knows why she is here, but no details given, internally distracted with sustained focus, reverts to pain  issues      General Comments General comments (skin integrity, edema, etc.): pt incontinent of liquid yellow stool during hygiene and was unaware    Exercises     Assessment/Plan    PT Assessment Patient needs continued PT services  PT Problem List Decreased strength;Decreased balance;Decreased knowledge of use of DME;Decreased activity tolerance;Decreased mobility;Decreased safety awareness       PT Treatment Interventions Gait training;DME instruction;Therapeutic activities;Therapeutic exercise;Patient/family education;Balance training;Functional mobility training    PT Goals (Current goals can be found in the Care Plan section)  Acute Rehab PT Goals Patient Stated Goal: none stated PT Goal Formulation: With patient Time For Goal Achievement: 02/21/17 Potential to Achieve Goals: Fair    Frequency Min 3X/week   Barriers to discharge        Co-evaluation               AM-PAC PT "6 Clicks" Daily Activity  Outcome Measure Difficulty turning over in bed (including adjusting bedclothes, sheets and blankets)?: None Difficulty moving from lying on back to sitting on the side of the bed? : None Difficulty sitting down on and standing up from a chair with arms (e.g., wheelchair, bedside commode, etc,.)?: Total Help needed moving to and from a bed to chair (including a wheelchair)?: A Little Help needed walking in hospital room?: A Little Help needed climbing 3-5 steps with a railing? : A Lot 6 Click Score: 17    End of Session   Activity Tolerance: Patient limited by pain Patient left: in  bed;with call bell/phone within reach Nurse Communication: Mobility status;Patient requests pain meds PT Visit Diagnosis: History of falling (Z91.81);Muscle weakness (generalized) (M62.81);Other abnormalities of gait and mobility (R26.89)    Time: 1025-1056 PT Time Calculation (min) (ACUTE ONLY): 31 min   Charges:   PT Evaluation $PT Eval Moderate Complexity: 1 Procedure PT Treatments $Therapeutic Activity: 8-22 mins   PT G CodesMagda Kiel, [place] [phone] 02/14/2017   Reginia Naas 02/14/2017, 11:58 AM

## 2017-02-14 NOTE — Progress Notes (Signed)
[facility]                                                                              Patient Demographics  Phyllis [name], is a 61 y.o. female, DOB - [birth year], CLT:[id number]  Admit date - 02/12/2017   Admitting Physician Elwin Mocha, MD  Outpatient Primary MD for the patient is [name], MD  Outpatient specialists:   LOS - 2  days    Chief Complaint  Patient presents with  . Shortness of Breath       Brief summary   Patient is a 61 year old female with hypertension, chronic pain scan due to osteoarthritis, bipolar disorder, chronic diastolic CHF, COPD, dyslipidemia, chronic pancreatitis, non-small cell lung cancer diagnosed in 2017. Patient was admitted from 5/8- 5/11 at [facility] for metabolic encephalopathy likely due to opiate misuse and taking excessively for chronic pain and depression. (Per husband, possibly buying from street). On DC summary 5/11, patient was not given any further prescriptions  for narcotics. Patient presented back to [facility] ED on 5/12 with multiple complaints including shortness of breath, headache, "hurting all over" and aggressive behavior. She has a history of lung cancer with metastases to brain. Chest x-ray revealed possible infiltrate in the left lower lobe. Patient was admitted to hospitalist service for further workup and observation. BP 172/97, she was admitted for possible HCAP  Assessment & Plan    Principal Problem:   Hypertensive urgency - Presented with hypertensive urgency in the setting of noncompliance with medications and possible polysubstance withdrawal - Continue low-dose Coreg and Catapres, BP currently improving  Active Problems:   Withdrawal syndrome (opiods and BZDs) with underlying history of bipolar disorder - Continue clonidine, Toradol,  Ultram for pain control - Psychiatry consulted, appreciate recommendations, continue low-dose Ativan for psychosis/agitation during  withdrawals, continue low-dose Cymbalta 30 mg daily - Seizure precautions     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 -Likely related to acute stressors as well as utilization of steroids -Hemoglobin A1c was 7.4 on 5/8 -will low dose oral agent on dc, given patient will be on Decadron - Continue sliding scale insulin    COPD (chronic obstructive pulmonary disease)  -Currently asymptomatic without wheezing  Chronic diastolic heart failure, NYHA class 1  -Currently compensated but monitor closely with volume resuscitation  Urinary retention - PVR over 400, place Foley catheter, UA negative for UTI    Code Status: full code  DVT Prophylaxis:  Lovenox  Family Communication: Discussed in detail with the patient, all imaging results, lab results explained to the patient, discussed with patient's husband at the bedside in detail, prefers to start radiation therapy inpatient   Disposition  Plan:   Time Spent in minutes   25 minutes  Procedures:  CT chest   Consultants:   None   Antimicrobials:   Vancomycin IV 1 dose  Cefepime IV 1 dose   Medications  Scheduled Meds: . carvedilol  3.125 mg Oral BID WC  . cloNIDine  0.1 mg Oral Daily  . dexamethasone  4 mg Intravenous Q4H  . DULoxetine  30 mg Oral Daily  . enoxaparin (LOVENOX) injection  40 mg Subcutaneous Q24H  . folic acid  1 mg Oral Daily  . gabapentin  300 mg Oral BID  . insulin aspart  0-5 Units Subcutaneous QHS  . insulin aspart  0-9 Units Subcutaneous TID WC  . ketorolac  15 mg Intravenous Q6H  . levETIRAcetam  500 mg Oral BID  . LORazepam  0-4 mg Intravenous Q6H   Followed by  . LORazepam  0-4 mg Intravenous Q12H  . multivitamin with minerals  1 tablet Oral Daily  . pantoprazole  40 mg Oral Daily  . sodium chloride flush  3 mL Intravenous Q12H  . thiamine  100 mg Oral Daily   Continuous Infusions:  PRN Meds:.acetaminophen **OR** acetaminophen, hydrALAZINE, LORazepam **OR** LORazepam, ondansetron **OR** ondansetron (ZOFRAN) IV   Antibiotics   Anti-infectives    Start     Dose/Rate Route Frequency Ordered Stop   02/12/17 1630  vancomycin (VANCOCIN) 500 mg in sodium chloride 0.9 % 100 mL IVPB  Status:  Discontinued     500 mg 100 mL/hr over 60 Minutes Intravenous Every 12 hours 02/12/17 1604 02/12/17 1821   02/12/17 1600  ceFEPIme (MAXIPIME) 1 g in dextrose 5 % 50 mL IVPB     1 g 100 mL/hr over 30 Minutes Intravenous  Once 02/12/17 1540 02/12/17 1802        Subjective:   Phyllis [name] was seen and examined today.Denies any specific complaints, husband at the bedside.  Overall feels weak. Patient denies dizziness, chest pain, shortness of breath, abdominal pain, N/V/D/C, new weakness, numbess, tingling. No acute events overnight.  Afebrile.  Objective:   Vitals:   02/14/17 0400 02/14/17 0500 02/14/17 0600 02/14/17 0829  BP: (!) 152/83 (!) 154/79 129/90 (!) 143/90  Pulse: 65 67 71  71  Resp: _0 (!) 28  Temp:    98.2 F (36.8 C)  TempSrc:    Oral  SpO2: 98% 98% 99% 100%  Weight:      Height:        Intake/Output Summary (Last 24 hours) at 02/14/17 1204 Last data filed at 02/14/17 1021  Gross per 24 hour  Intake              490 ml  Output              875 ml  Net             -385 ml     Wt Readings from Last 3 Encounters:  02/14/17 52.7 kg (116 lb 2.9 oz)  02/11/17 52.3 kg (115 lb 4.8 oz)  01/19/17 51.3 kg (113 lb)     Exam  General: Feels sleepy but easily arousable, NAD  HEENT:  PERRLA, EOMI  Neck: Supple, no JVD  Cardiovascular: S1 and S2 clear, regular rate and rhythm  Respiratory: CTA B  Gastrointestinal: Soft, nontender, nondistended, + bowel sounds  Ext: no cyanosis clubbing or edema  Neuro no new deficits  Skin: No rashes  Psych:flat affect, feels sleepy but arousable   Data Reviewed:  I have personally reviewed following labs and imaging studies  Micro Results No results found for this or any previous visit (from the past 240 hour(s)).  Radiology Reports Dg Chest 2 View  Result Date: 02/12/2017 CLINICAL DATA:  Acute chest pain and shortness of breath. EXAM: CHEST  2 VIEW COMPARISON:  01/11/2017 chest radiograph and prior studies FINDINGS: The cardiomediastinal silhouette is unremarkable. COPD/emphysema noted. Streaky lingular opacity is noted and may represent atelectasis or mild airspace disease/ pneumonia. There is no evidence of pulmonary edema, suspicious pulmonary nodule/mass, pleural effusion, or pneumothorax. No acute bony abnormalities are identified. IMPRESSION: Streaky lingular opacity - question mild atelectasis or mild airspace disease/ pneumonia. COPD/emphysema. Electronically Signed   By: Margarette Canada M.D.   On: 02/12/2017 15:18   Ct Head Wo Contrast  Result Date: 02/08/2017 CLINICAL DATA:  Recent brain surgery. Hallucinations. Poorly differentiated non-small cell lung cancer. EXAM: CT HEAD WITHOUT CONTRAST  TECHNIQUE: Contiguous axial images were obtained from the base of the skull through the vertex without intravenous contrast. COMPARISON:  CT head 01/13/2017.  MR head 01/11/2017. FINDINGS: Brain: Status post LEFT parietal burr hole for stereotactic biopsy of a LEFT hemisphere mass, with subsequent diagnosis of poorly differentiated non small cell lung cancer. The mass remains hyperattenuating on noncontrast exam, but is not frankly hemorrhagic. There is mild associated vasogenic edema. There may be slight interval growth from priors, up to 3 cm long axis as compared to 2.2 cm previously, without LEFT-to-RIGHT shift. Mild mass effect on the LEFT lateral ventricle. No other mass lesions are identified on this noncontrast exam. Vascular: No hyperdense vessel or unexpected calcification. Skull: Aside from the burr hole, unremarkable. Surgical skin staples have not been removed. Sinuses/Orbits: Unremarkable. Other: None. IMPRESSION: Suspected interval growth of the hyperattenuating LEFT parietal periventricular intracranial metastasis, with mild surrounding edema but no apparent acute findings. No postsurgical complication is evident. Electronically Signed   By: Staci Righter M.D.   On: 02/08/2017 20:19   Ct Chest Wo Contrast  Result Date: 02/12/2017 CLINICAL DATA:  Follow-up Healthcare acquired pneumonia EXAM: CT CHEST WITHOUT CONTRAST TECHNIQUE: Multidetector CT imaging of the chest was performed following the standard protocol without IV contrast. COMPARISON:  Chest radiographs dated 02/12/2017. CT chest dated 11/18/2016. FINDINGS: Cardiovascular: The heart is top-normal in size. Trace pericardial effusion. Three vessel coronary atherosclerosis. No evidence of thoracic aortic aneurysm. Atherosclerotic calcifications of the aortic arch. Mediastinum/Nodes: 2.1 cm short axis node along the right azygoesophageal recess (series 3/image 37), previously 1.9 cm. Bilateral hilar regions are poorly evaluated in the absence  intravenous contrast administration. No suspicious axillary lymphadenopathy. Visualized right thyroid is unremarkable. Left thyroid is not discretely visualized. Lungs/Pleura: Moderate centrilobular and paraseptal emphysematous changes, upper lobe predominant. Left lower lobe pulmonary sequestration. 1.5 x 1.8 cm left lower lobe nodule, grossly unchanged. This corresponds to the patient's known primary bronchogenic neoplasm status post stereotactic radiation. Additional branching mucous plugging in the posterior left lower lobe (series 4/images 108-112). Mild nodular scarring in the posterior right lower lobe (series 4/ image 87), improved.  Mild lingular scarring with bronchiectasis (series 4/ image 92). No superimposed opacities suspicious for pneumonia. No pleural effusion or pneumothorax. Upper Abdomen: Cirrhosis. Status post cholecystectomy. Sequela of prior/ chronic pancreatitis. Mild nodular thickening of the bilateral adrenal glands without discrete mass. Mild thickening of the proximal stomach, although favoring underdistention. Musculoskeletal: Mild superior endplate changes at E7-0. IMPRESSION: No findings suspicious for pneumonia. 1.5 x 1.8 cm left lower lobe nodule, grossly unchanged, corresponding to the patient's known primary bronchogenic neoplasm status post stereotactic radiation. 2.1 cm short axis node along the right azygoesophageal recess, suspicious for nodal metastasis, mildly increased. Prior posterior right lower lobe nodule/neoplasm is improved, with minimal residual nodular scarring. Additional ancillary findings as above. Electronically Signed   By: Julian Hy M.D.   On: 02/12/2017 19:42   Mr Jeri Cos JJ Contrast  Result Date: 02/10/2017 CLINICAL DATA:  Brain mass status post craniotomy. EXAM: MRI HEAD WITHOUT AND WITH CONTRAST TECHNIQUE: Multiplanar, multiecho pulse sequences of the brain and surrounding structures were obtained without and with intravenous contrast. CONTRAST:   58m MULTIHANCE GADOBENATE DIMEGLUMINE 529 MG/ML IV SOLN COMPARISON:  Head CT 02/08/2017 Brain MRI 01/11/2017 FINDINGS: Brain: The midline structures are normal. No focal diffusion restriction to indicate acute infarct. No intraparenchymal hemorrhage. The contrast-enhancing mass within the periatrial left parietal white matter, involving the left aspect of the splenium of the corpus callosum, now measures 3.5 x 2.4 cm, previously 3.1 x 1.8 cm. The degree of associated hyperintense T2 weighted signal is also increased. Diffusion-weighted imaging shows likely involvement of the contralateral corpus callosum splenium. There is mass effect on the atrium of the left lateral ventricle, but no midline shift. There are no new contrast-enhancing lesions. Multiple round low T1, high T2 weighted signal foci within the periventricular and deep white matter are unchanged. There is hemosiderin again seen within the mass. No hydrocephalus. Vascular: Major intracranial arterial and venous sinus flow voids are preserved. Skull and upper cervical spine: Left parietal burr hole. Sinuses/Orbits: No fluid levels or advanced mucosal thickening. No mastoid effusion. Normal orbits. IMPRESSION: 1. Increased size of mass located in the left parietal periatrial white matter, involving the splenium of the corpus callosum. Biopsy results show metastatic lung carcinoma. 2. Increased T2 weighted signal hyperintensity surrounding the lesion, likely a combination of edema related to tumor growth and recent biopsy. 3. Unchanged appearance of multiple round foci of signal abnormality without contrast enhancement, which may indicate chronic demyelinating lesions. Electronically Signed   By: KUlyses JarredM.D.   On: 02/10/2017 20:11    Lab Data:  CBC:  Recent Labs Lab 02/08/17 1912 02/09/17 0510 02/12/17 1210 02/12/17 1222 02/13/17 0304 02/14/17 0330  WBC 6.9 3.7* 6.1  --  6.1 5.8  NEUTROABS 4.8 2.9  --   --   --   --   HGB 17.8* 15.2*  15.9* 17.3* 15.7* 15.7*  HCT 50.3* 44.7 46.6* 51.0* 45.1 46.1*  MCV 90.8 90.7 91.4  --  89.8 90.0  PLT 287 207 249  --  217 2009  Basic Metabolic Panel:  Recent Labs Lab 02/08/17 1912 02/09/17 0510 02/10/17 1210 02/12/17 1222 02/12/17 1331 02/12/17 2224 02/13/17 0304 02/14/17 0330  NA 128* 130* 128* 129* 127*  --  126* 129*  K 3.2* 4.0 3.9 4.0 4.1  --  3.5 3.8  CL 92* 98* 97* 96* 94*  --  94* 97*  CO2 23 21* 23  --  25  --  23 23  GLUCOSE 151* 154* 161* 226*  205*  --  181* 150*  BUN 23* 24* 25* 26* 23*  --  19 25*  CREATININE 1.10* 0.92 0.94 0.90 0.94  --  0.73 0.91  CALCIUM 9.2 8.5* 8.8*  --  9.1  --  8.4* 8.8*  MG 1.9  --   --   --   --  1.5*  --   --   PHOS  --   --   --   --   --  2.5  --   --    GFR: Estimated Creatinine Clearance: 53.3 mL/min (by C-G formula based on SCr of 0.91 mg/dL). Liver Function Tests:  Recent Labs Lab 02/08/17 1912 02/12/17 1437 02/13/17 0304  AST 27 39 31  ALT 19 33 32  ALKPHOS 96 75 66  BILITOT 1.1 0.7 0.6  PROT 7.3 6.0* 5.4*  ALBUMIN 3.9 3.2* 2.9*   No results for input(s): LIPASE, AMYLASE in the last 168 hours. No results for input(s): AMMONIA in the last 168 hours. Coagulation Profile: No results for input(s): INR, PROTIME in the last 168 hours. Cardiac Enzymes: No results for input(s): CKTOTAL, CKMB, CKMBINDEX, TROPONINI in the last 168 hours. BNP (last 3 results) No results for input(s): PROBNP in the last 8760 hours. HbA1C: No results for input(s): HGBA1C in the last 72 hours. CBG:  Recent Labs Lab 02/13/17 0820 02/13/17 1218 02/13/17 1639 02/13/17 2131 02/14/17 0831  GLUCAP 166* 152* 144* 116* 168*   Lipid Profile: No results for input(s): CHOL, HDL, LDLCALC, TRIG, CHOLHDL, LDLDIRECT in the last 72 hours. Thyroid Function Tests: No results for input(s): TSH, T4TOTAL, FREET4, T3FREE, THYROIDAB in the last 72 hours. Anemia Panel: No results for input(s): VITAMINB12, FOLATE, FERRITIN, TIBC, IRON, RETICCTPCT in  the last 72 hours. Urine analysis:    Component Value Date/Time   COLORURINE STRAW (A) 02/12/2017 0011   APPEARANCEUR CLEAR 02/12/2017 0011   LABSPEC 1.010 02/12/2017 0011   PHURINE 7.0 02/12/2017 0011   GLUCOSEU 50 (A) 02/12/2017 0011   GLUCOSEU NEGATIVE 12/16/2016 1444   HGBUR NEGATIVE 02/12/2017 0011   BILIRUBINUR NEGATIVE 02/12/2017 0011   KETONESUR NEGATIVE 02/12/2017 0011   PROTEINUR NEGATIVE 02/12/2017 0011   UROBILINOGEN 0.2 12/16/2016 1444   NITRITE NEGATIVE 02/12/2017 0011   LEUKOCYTESUR NEGATIVE 02/12/2017 0011     Makai Dumond M.D. [facility] 02/14/2017, 12:04 PM  Pager: [phone] Between 7am to 7pm - call Pager - [phone]  After 7pm go to [email or web address] - password [email or web address]  Call night coverage person covering after 7pm

## 2017-02-14 NOTE — Progress Notes (Signed)
Initial Nutrition Assessment  DOCUMENTATION CODES:   Severe malnutrition in context of chronic illness  INTERVENTION:    Mighty Shake II TID with meals, each supplement provides 480-500 kcals and 20-23 grams of protein  NUTRITION DIAGNOSIS:   Malnutrition (severe) related to chronic illness (lung CA, COPD, CHF) as evidenced by severe depletion of muscle mass, percent weight loss.  GOAL:   Patient will meet greater than or equal to 90% of their needs  MONITOR:   PO intake, Supplement acceptance  REASON FOR ASSESSMENT:   Malnutrition Screening Tool    ASSESSMENT:   61 year old female with HTN, chronic pain due to osteoarthritis, bipolar disorder, CHF, COPD, dyslipidemia, chronic pancreatitis, non-small cell lung cancer diagnosed in 2017. Recent admission from 5/8- 5/11 at [facility] for metabolic encephalopathy likely due to opiate misuse and taking excessively for chronic pain and depression. Patient presented to [facility] ED on 5/12 with SOB, headache, "hurting all over" and aggressive behavior. She has a history of lung cancer with metastases to brain. Admitted for possible HCAP.   Patient reports recent poor intake and poor appetite for the past 3 days. Usual weight 130 lbs, now down to 116 lbs. 11% weight loss in the past 3 months is significant. Nutrition-Focused physical exam completed. Findings are mild-moderate fat depletion, mild-moderate and severe muscle depletion, and no edema.  Patient with severe PCM. Labs reviewed: sodium 129 (L) CBG's: [phone] Medications reviewed and include folic acid, thiamine, MVI.  Diet Order:  Diet regular Room service appropriate? Yes; Fluid consistency: Thin  Skin:  Wound (see comment) (head incision)  Last BM:  5/13  Height:   Ht Readings from Last 1 Encounters:  02/12/17 _0  (1.651 m)    Weight:   Wt Readings from Last 1 Encounters:  02/14/17 116 lb 2.9 oz (52.7 kg)    Ideal Body Weight:  56.8 kg  BMI:  Body mass index  is 19.33 kg/m.  Estimated Nutritional Needs:   Kcal:  1500-1700  Protein:  70-90 gm  Fluid:  >/= 1.5 L  EDUCATION NEEDS:   No education needs identified at this time  Molli Barrows, [place], [place], [place] Pager [phone] After Hours Pager [phone]

## 2017-02-15 LAB — BASIC METABOLIC PANEL
ANION GAP: 8 (ref 5–15)
BUN: 35 mg/dL — AB (ref 6–20)
CALCIUM: 9 mg/dL (ref 8.9–10.3)
CO2: 23 mmol/L (ref 22–32)
CREATININE: 1.21 mg/dL — AB (ref 0.44–1.00)
Chloride: 97 mmol/L — ABNORMAL LOW (ref 101–111)
GFR calc Af Amer: 54 mL/min — ABNORMAL LOW (ref >60)
GFR, EST NON AFRICAN AMERICAN: 47 mL/min — AB (ref >60)
GLUCOSE: 181 mg/dL — AB (ref 65–99)
Potassium: 4.5 mmol/L (ref 3.5–5.1)
Sodium: 128 mmol/L — ABNORMAL LOW (ref 135–145)

## 2017-02-15 LAB — GLUCOSE, CAPILLARY
GLUCOSE-CAPILLARY: 306 mg/dL — AB (ref 65–99)
Glucose-Capillary: 187 mg/dL — ABNORMAL HIGH (ref 65–99)
Glucose-Capillary: 386 mg/dL — ABNORMAL HIGH (ref 65–99)
Glucose-Capillary: 248 mg/dL — ABNORMAL HIGH (ref 65–99)

## 2017-02-15 LAB — CBC
HEMATOCRIT: 44.9 % (ref 36.0–46.0)
Hemoglobin: 15.2 g/dL — ABNORMAL HIGH (ref 12.0–15.0)
MCH: 30.7 pg (ref 26.0–34.0)
MCHC: 33.9 g/dL (ref 30.0–36.0)
MCV: 90.7 fL (ref 78.0–100.0)
PLATELETS: 252 10*3/uL (ref 150–400)
RBC: 4.95 MIL/uL (ref 3.87–5.11)
RDW: 12.8 % (ref 11.5–15.5)
WBC: 6.5 10*3/uL (ref 4.0–10.5)

## 2017-02-15 MED ORDER — HYDRALAZINE HCL 20 MG/ML IJ SOLN: 10.0000 mg | Freq: Four times a day (QID) | INTRAMUSCULAR | Status: DC | PRN

## 2017-02-15 MED ORDER — MUPIROCIN CALCIUM 2 % EX CREA
TOPICAL_CREAM | Freq: Two times a day (BID) | CUTANEOUS | Status: DC
  Administered 2017-02-15 – 2017-02-17 (×4): via TOPICAL
  Filled 2017-02-15: qty 15

## 2017-02-15 MED ORDER — DEXAMETHASONE 4 MG PO TABS
4.0000 mg | ORAL_TABLET | Freq: Three times a day (TID) | ORAL | Status: DC
  Administered 2017-02-15 – 2017-02-17 (×6): 4 mg via ORAL
  Filled 2017-02-15 (×7): qty 1

## 2017-02-15 MED ORDER — CLONIDINE HCL 0.1 MG PO TABS
0.1000 mg | ORAL_TABLET | Freq: Two times a day (BID) | ORAL | Status: DC
  Administered 2017-02-15 – 2017-02-17 (×5): 0.1 mg via ORAL
  Filled 2017-02-15 (×5): qty 1

## 2017-02-15 NOTE — Progress Notes (Signed)
Patient to transfer to 5W-17. Report called to [facility]

## 2017-02-15 NOTE — Progress Notes (Signed)
[facility]                                                                              Patient Demographics  Phyllis [name], is a 61 y.o. female, DOB - [birth year], OER:[id number]  Admit date - 02/12/2017   Admitting Physician Elwin Mocha, MD  Outpatient Primary MD for the patient is [name], MD  Outpatient specialists:   LOS - 3  days    Chief Complaint  Patient presents with  . Shortness of Breath       Brief summary   Patient is a 61 year old female with hypertension, chronic pain scan due to osteoarthritis, bipolar disorder, chronic diastolic CHF, COPD, dyslipidemia, chronic pancreatitis, non-small cell lung cancer diagnosed in 2017. Patient was admitted from 5/8- 5/11 at [facility] for metabolic encephalopathy likely due to opiate misuse and taking excessively for chronic pain and depression. (Per husband, possibly buying from street). On DC summary 5/11, patient was not given any further prescriptions  for narcotics. Patient presented back to [facility] ED on 5/12 with multiple complaints including shortness of breath, headache, "hurting all over" and aggressive behavior. She has a history of lung cancer with metastases to brain. Chest x-ray revealed possible infiltrate in the left lower lobe. Patient was admitted to hospitalist service for further workup and observation. BP 172/97, she was admitted for possible HCAP  Assessment & Plan    Principal Problem:   Hypertensive urgency - Patient had presented with hypertensive urgency in the setting of noncompliance with medications and possible polysubstance withdrawal - Continue low-dose Coreg. BP still uncontrolled, will increase clonidine to 0.1 mg twice a day, continue hydralazine as needed with parameters  Active Problems:   Withdrawal syndrome (opiods and BZDs) with underlying history of bipolar disorder - Continue clonidine, Toradol,  Ultram for pain control - Psychiatry was  consulted, appreciate recommendations, continue low-dose Ativan for psychosis/agitation during withdrawals, continue low-dose Cymbalta 30 mg daily - Seizure precautions  - Patient strongly counseled against misuse of opioids. Discussed with patient's husband at the bedside as well.      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  - Patient will start radiation treatments on 5/17 at 8a.m.  - patient is currently alert and oriented, will transfer to regular floor today, discussed in detail with the patient's husband, he will bring her to the radiation treatment to [facility] on 5/17 a.m.  - If patient medically ready, DC home in a.m.     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 -Likely related to acute stressors as well as utilization of steroids -Hemoglobin A1c was 7.4 on 5/8 -will low dose oral agent on dc, given patient will be on Decadron, patient will benefit from being on low-dose metformin or glipizide at discharge - Continue sliding  scale insulin    COPD (chronic obstructive pulmonary disease)  -Currently asymptomatic without wheezing    Chronic diastolic heart failure, NYHA class 1  -Currently compensated but monitor closely with volume resuscitation  Urinary retention - PVR over 400, place Foley catheter, UA negative for UTI  Severe malnutrition in context of metastatic malignancy  - nutrition consult was obtained  Code Status: full code  DVT Prophylaxis:  Lovenox  Family Communication: Discussed in detail with the patient, all imaging results, lab results explained to the patient, discussed with patient's husband at the bedside in detail. Patient is currently medically stable, alert and oriented. Patient's husband feel comfortable bringing her to [facility] for radiation treatment on 5/17 a.m.   Disposition Plan: Transferred to [place] floor, if medically stable, DC home in a.m.  Time Spent in minutes   25 minutes  Procedures:  CT chest   Consultants:   None   Antimicrobials:   Vancomycin IV 1 dose  Cefepime IV 1 dose   Medications  Scheduled Meds: . carvedilol  3.125 mg Oral BID WC  . cloNIDine  0.1 mg Oral BID  . dexamethasone  4 mg Intravenous Q4H  . DULoxetine  30 mg Oral Daily  . enoxaparin (LOVENOX) injection  40 mg Subcutaneous Q24H  . folic acid  1 mg Oral Daily  . gabapentin  300 mg Oral BID  . insulin aspart  0-5 Units Subcutaneous QHS  . insulin aspart  0-9 Units Subcutaneous TID WC  . ketorolac  15 mg Intravenous Q6H  . levETIRAcetam  500 mg Oral BID  . LORazepam  0-4 mg Intravenous Q12H  . multivitamin with minerals  1 tablet Oral Daily  . pantoprazole  40 mg Oral Daily  . sodium chloride flush  3 mL Intravenous Q12H  . thiamine  100 mg Oral Daily   Continuous Infusions:  PRN Meds:.acetaminophen **OR** acetaminophen, hydrALAZINE, LORazepam **OR** LORazepam, ondansetron **OR** ondansetron (ZOFRAN) IV, traMADol   Antibiotics   Anti-infectives    Start      Dose/Rate Route Frequency Ordered Stop   02/12/17 1630  vancomycin (VANCOCIN) 500 mg in sodium chloride 0.9 % 100 mL IVPB  Status:  Discontinued     500 mg 100 mL/hr over 60 Minutes Intravenous Every 12 hours 02/12/17 1604 02/12/17 1821   02/12/17 1600  ceFEPIme (MAXIPIME) 1 g in dextrose 5 % 50 mL IVPB     1 g 100 mL/hr over 30 Minutes Intravenous  Once 02/12/17 1540 02/12/17 1802        Subjective:   Phyllis [name] was seen and examined today. Appears comfortable, no nausea, vomiting, fevers or chills or any coughing. Overall feels weak. Patient denies dizziness, chest pain, shortness of breath, abdominal pain, N/V/D/C, new weakness, numbess, tingling. No acute events overnight.    Objective:   Vitals:   02/15/17 0356 02/15/17 0400 02/15/17 0802 02/15/17 0825  BP:  (!) 171/90 (!) 153/90 (!) 165/89  Pulse:  64 71 65  Resp:  17  15  Temp:  98.6 F (37 C)  98.1 F (36.7 C)  TempSrc:  Oral  Oral  SpO2:  97%  100%  Weight: 52.8 kg (116 lb 6.5 oz)     Height:        Intake/Output Summary (Last 24 hours) at 02/15/17 1046 Last data filed at 02/15/17 0410  Gross per 24 hour  Intake              359 ml  Output              375 ml  Net              -16 ml     Wt Readings from Last 3 Encounters:  02/15/17 52.8 kg (116 lb 6.5 oz)  02/11/17 52.3 kg (115 lb 4.8 oz)  01/19/17 51.3 kg (113 lb)     Exam  General: Alert and oriented 3, NAD  HEENT:  PERRLA, EOMI  Neck: Supple, no JVD  Cardiovascular: S1-S2 clear, regular rate and rhythm  Respiratory: Clear to auscultation bilaterally  Gastrointestinal: Soft, nontender, nondistended, normal bowel sounds  Ext: no cyanosis, clubbing, edema  Neuro: no neuro deficits  Skin: No rashes  Psych: alert and oriented 3, NAD   Data Reviewed:  I have personally reviewed following labs and imaging studies  Micro Results No results found for this or any previous visit (from the past 240 hour(s)).  Radiology Reports Dg  Chest 2 View  Result Date: 02/12/2017 CLINICAL DATA:  Acute chest pain and shortness of breath. EXAM: CHEST  2 VIEW COMPARISON:  01/11/2017 chest radiograph and prior studies FINDINGS: The cardiomediastinal silhouette is unremarkable. COPD/emphysema noted. Streaky lingular opacity is noted and may represent atelectasis or mild airspace disease/ pneumonia. There is no evidence of pulmonary edema, suspicious pulmonary nodule/mass, pleural effusion, or pneumothorax. No acute bony abnormalities are identified. IMPRESSION: Streaky lingular opacity - question mild atelectasis or mild airspace disease/ pneumonia. COPD/emphysema. Electronically Signed   By: Margarette Canada M.D.   On: 02/12/2017 15:18   Ct Head Wo Contrast  Result Date: 02/08/2017 CLINICAL DATA:  Recent brain surgery. Hallucinations. Poorly differentiated non-small cell lung cancer. EXAM: CT HEAD WITHOUT CONTRAST TECHNIQUE: Contiguous axial images were obtained from the base of the skull through the vertex without intravenous contrast. COMPARISON:  CT head 01/13/2017.  MR head 01/11/2017. FINDINGS: Brain: Status post LEFT parietal burr hole for stereotactic biopsy of a LEFT hemisphere mass, with subsequent diagnosis of poorly differentiated non small cell lung cancer. The mass remains hyperattenuating on noncontrast exam, but is not frankly hemorrhagic. There is mild associated vasogenic edema. There may be slight interval growth from priors, up to 3 cm long axis as compared to 2.2 cm previously, without LEFT-to-RIGHT shift. Mild mass effect on the LEFT lateral ventricle. No other mass lesions are identified on this noncontrast exam. Vascular: No hyperdense vessel or unexpected calcification. Skull: Aside from the burr hole, unremarkable. Surgical skin staples have not been removed. Sinuses/Orbits: Unremarkable. Other: None. IMPRESSION: Suspected interval growth of the hyperattenuating LEFT parietal periventricular intracranial metastasis, with mild  surrounding edema but no apparent acute findings. No postsurgical complication is evident. Electronically Signed   By: Staci Righter M.D.   On: 02/08/2017 20:19   Ct Chest Wo Contrast  Result Date: 02/12/2017 CLINICAL DATA:  Follow-up Healthcare acquired pneumonia EXAM: CT CHEST WITHOUT CONTRAST TECHNIQUE: Multidetector CT imaging of the chest was performed following the standard protocol without IV contrast. COMPARISON:  Chest radiographs dated 02/12/2017.  CT chest dated 11/18/2016. FINDINGS: Cardiovascular: The heart is top-normal in size. Trace pericardial effusion. Three vessel coronary atherosclerosis. No evidence of thoracic aortic aneurysm. Atherosclerotic calcifications of the aortic arch. Mediastinum/Nodes: 2.1 cm short axis node along the right azygoesophageal recess (series 3/image 37), previously 1.9 cm. Bilateral hilar regions are poorly evaluated in the absence intravenous contrast administration. No suspicious axillary lymphadenopathy. Visualized right thyroid is unremarkable. Left thyroid is not discretely visualized. Lungs/Pleura: Moderate centrilobular and paraseptal emphysematous changes, upper lobe predominant. Left lower lobe pulmonary sequestration. 1.5 x 1.8 cm left lower lobe nodule, grossly unchanged. This corresponds to the patient's known primary bronchogenic neoplasm status post stereotactic radiation. Additional branching mucous plugging in the posterior left lower lobe (series 4/images 108-112). Mild nodular scarring in the posterior right lower lobe (series 4/ image 87), improved. Mild lingular scarring with bronchiectasis (series 4/ image 92). No superimposed opacities suspicious for pneumonia. No pleural effusion or pneumothorax. Upper Abdomen: Cirrhosis. Status post cholecystectomy. Sequela of prior/ chronic pancreatitis. Mild nodular thickening of the bilateral adrenal glands without discrete mass. Mild thickening of the proximal stomach, although favoring underdistention.  Musculoskeletal: Mild superior endplate changes at Y8-5. IMPRESSION: No findings suspicious for pneumonia. 1.5 x 1.8 cm left lower lobe nodule, grossly unchanged, corresponding to the patient's known primary bronchogenic neoplasm status post stereotactic radiation. 2.1 cm short axis node along the right azygoesophageal recess, suspicious for nodal metastasis, mildly increased. Prior posterior right lower lobe nodule/neoplasm is improved, with minimal residual nodular scarring. Additional ancillary findings as above. Electronically Signed   By: Julian Hy M.D.   On: 02/12/2017 19:42   Mr Jeri Cos OY Contrast  Result Date: 02/10/2017 CLINICAL DATA:  Brain mass status post craniotomy. EXAM: MRI HEAD WITHOUT AND WITH CONTRAST TECHNIQUE: Multiplanar, multiecho pulse sequences of the brain and surrounding structures were obtained without and with intravenous contrast. CONTRAST:  40m MULTIHANCE GADOBENATE DIMEGLUMINE 529 MG/ML IV SOLN COMPARISON:  Head CT 02/08/2017 Brain MRI 01/11/2017 FINDINGS: Brain: The midline structures are normal. No focal diffusion restriction to indicate acute infarct. No intraparenchymal hemorrhage. The contrast-enhancing mass within the periatrial left parietal white matter, involving the left aspect of the splenium of the corpus callosum, now measures 3.5 x 2.4 cm, previously 3.1 x 1.8 cm. The degree of associated hyperintense T2 weighted signal is also increased. Diffusion-weighted imaging shows likely involvement of the contralateral corpus callosum splenium. There is mass effect on the atrium of the left lateral ventricle, but no midline shift. There are no new contrast-enhancing lesions. Multiple round low T1, high T2 weighted signal foci within the periventricular and deep white matter are unchanged. There is hemosiderin again seen within the mass. No hydrocephalus. Vascular: Major intracranial arterial and venous sinus flow voids are preserved. Skull and upper cervical spine:  Left parietal burr hole. Sinuses/Orbits: No fluid levels or advanced mucosal thickening. No mastoid effusion. Normal orbits. IMPRESSION: 1. Increased size of mass located in the left parietal periatrial white matter, involving the splenium of the corpus callosum. Biopsy results show metastatic lung carcinoma. 2. Increased T2 weighted signal hyperintensity surrounding the lesion, likely a combination of edema related to tumor growth and recent biopsy. 3. Unchanged appearance of multiple round foci of signal abnormality without contrast enhancement, which may indicate chronic demyelinating lesions. Electronically Signed   By: KUlyses JarredM.D.   On: 02/10/2017 20:11    Lab Data:  CBC:  Recent Labs Lab 02/08/17 1912 02/09/17 0510 02/12/17 1210 02/12/17 1222 02/13/17 0304 02/14/17 0330 02/15/17 0308  WBC 6.9 3.7* 6.1  --  6.1 5.8 6.5  NEUTROABS 4.8 2.9  --   --   --   --   --   HGB 17.8* 15.2* 15.9* 17.3* 15.7* 15.7* 15.2*  HCT 50.3* 44.7 46.6* 51.0* 45.1 46.1* 44.9  MCV 90.8 90.7 91.4  --  89.8 90.0 90.7  PLT 287 207 249  --  217 229 725   Basic Metabolic Panel:  Recent Labs Lab 02/08/17 1912  02/10/17 1210 02/12/17 1222 02/12/17 1331 02/12/17 2224 02/13/17 0304 02/14/17 0330 02/15/17 0308  NA 128*  < > 128* 129* 127*  --  126* 129* 128*  K 3.2*  < > 3.9 4.0 4.1  --  3.5 3.8 4.5  CL 92*  < > 97* 96* 94*  --  94* 97* 97*  CO2 23  < > 23  --  25  --  _0 GLUCOSE 151*  < > 161* 226* 205*  --  181* 150* 181*  BUN 23*  < > 25* 26* 23*  --  19 25* 35*  CREATININE 1.10*  < > 0.94 0.90 0.94  --  0.73 0.91 1.21*  CALCIUM 9.2  < > 8.8*  --  9.1  --  8.4* 8.8* 9.0  MG 1.9  --   --   --   --  1.5*  --   --   --   PHOS  --   --   --   --   --  2.5  --   --   --   < > = values in this interval not displayed. GFR: Estimated Creatinine Clearance: 40.2 mL/min (A) (by C-G formula based on SCr of 1.21 mg/dL (H)). Liver Function Tests:  Recent Labs Lab 02/08/17 1912 02/12/17 1437  02/13/17 0304  AST 27 39 31  ALT 19 33 32  ALKPHOS 96 75 66  BILITOT 1.1 0.7 0.6  PROT 7.3 6.0* 5.4*  ALBUMIN 3.9 3.2* 2.9*   No results for input(s): LIPASE, AMYLASE in the last 168 hours. No results for input(s): AMMONIA in the last 168 hours. Coagulation Profile: No results for input(s): INR, PROTIME in the last 168 hours. Cardiac Enzymes: No results for input(s): CKTOTAL, CKMB, CKMBINDEX, TROPONINI in the last 168 hours. BNP (last 3 results) No results for input(s): PROBNP in the last 8760 hours. HbA1C: No results for input(s): HGBA1C in the last 72 hours. CBG:  Recent Labs Lab 02/14/17 0831 02/14/17 1241 02/14/17 1558 02/14/17 2136 02/15/17 0826  GLUCAP 168* 157* 236* 150* 187*   Lipid Profile: No results for input(s): CHOL, HDL, LDLCALC, TRIG, CHOLHDL, LDLDIRECT in the last 72 hours. Thyroid Function Tests: No results for input(s): TSH, T4TOTAL, FREET4, T3FREE, THYROIDAB in the last 72 hours. Anemia Panel: No results for input(s): VITAMINB12, FOLATE, FERRITIN, TIBC, IRON, RETICCTPCT in the last 72 hours. Urine analysis:    Component Value Date/Time   COLORURINE STRAW (A) 02/12/2017 0011   APPEARANCEUR CLEAR 02/12/2017 0011   LABSPEC 1.010 02/12/2017 0011   PHURINE 7.0 02/12/2017 0011   GLUCOSEU 50 (A) 02/12/2017 0011   GLUCOSEU NEGATIVE 12/16/2016 1444   HGBUR NEGATIVE 02/12/2017 0011   BILIRUBINUR NEGATIVE 02/12/2017 0011   KETONESUR NEGATIVE 02/12/2017 0011   PROTEINUR NEGATIVE 02/12/2017 0011   UROBILINOGEN 0.2 12/16/2016 1444   NITRITE NEGATIVE 02/12/2017 0011   LEUKOCYTESUR NEGATIVE 02/12/2017 0011     Ripudeep Rai M.D. [facility] 02/15/2017, 10:46 AM  Pager: [phone] Between 7am to 7pm -  call Pager - [phone]  After 7pm go to [email or web address] - password [email or web address]  Call night coverage person covering after 7pm

## 2017-02-15 NOTE — Progress Notes (Signed)
Physical Therapy Treatment Patient Details Name: Phyllis [name] MRN: [id number] DOB: 1956-08-08 Today's Date: 02/15/2017    History of Present Illness Phyllis [name] is a 61 y.o. female patient admitted with presentation of  paranoia, intermittent confusion, hypertensive urgency and severe headache consistent with withdrawal syndrome related to multiple medications including benzodiazepines, narcotics, Cymbalta, and Keppra. Patient now under detoxification for long-term utilization of oxycodone (since 2013) and Xanax (since 2013/2014).  Also found to have possible lung infiltrate on CXR due to HCAP.  Positive medical history significant for non-small cell lung cancer with intracranial metastasis status post radiation, hypertension, depression with anxiety.      PT Comments    Patient is progressing toward mobility goals. Pt continues to demonstrate generalized weakness and balance deficits. Gait with RW and min A this session. Continue to progress as tolerated with anticipated d/c home with HHPT.   Follow Up Recommendations  Home health PT;Supervision/Assistance - 24 hour     Equipment Recommendations  Other (comment) (TBD based on progression-may need RW)    Recommendations for Other Services       Precautions / Restrictions Precautions Precautions: Fall;Other (comment) (seizure) Precaution Comments: reports recent fall    Mobility  Bed Mobility Overal bed mobility: Needs Assistance Bed Mobility: Supine to Sit     Supine to sit: Supervision     General bed mobility comments: supervision for safety; increased time and effort  Transfers Overall transfer level: Needs assistance Equipment used: 1 person hand held assist Transfers: Sit to/from Stand Sit to Stand: Min assist         General transfer comment: assist to steady upon standing  Ambulation/Gait Ambulation/Gait assistance: Min assist;Min guard Ambulation Distance (Feet): 100 Feet Assistive device: 1  person hand held assist;Rolling walker (2 wheeled) Gait Pattern/deviations: Step-through pattern;Decreased stride length;Staggering left;Staggering right;Narrow base of support Gait velocity: decreased   General Gait Details: initially attempted ambulating with HHA and then began using RW; pt unsteady with HHA and with decreased bilat step length and dorsiflexion; pt more steady with RW however required assist to manage RW once back in room to negotiate objects and for direction around bed; pt became very distracted by husband stating he would check on her pain medicine   Stairs            Wheelchair Mobility    Modified Rankin (Stroke Patients Only)       Balance Overall balance assessment: Needs assistance Sitting-balance support: Bilateral upper extremity supported;Feet supported Sitting balance-Leahy Scale: Good     Standing balance support: Single extremity supported Standing balance-Leahy Scale: Poor                              Cognition Arousal/Alertness: Awake/alert Behavior During Therapy: Flat affect Overall Cognitive Status: Impaired/Different from baseline Area of Impairment: Problem solving;Safety/judgement;Following commands                       Following Commands: Follows one step commands with increased time Safety/Judgement: Decreased awareness of safety   Problem Solving: Slow processing;Requires verbal cues;Decreased initiation General Comments: pt perseverated on need for pain medicine throughout session; pt became very anxious and emotional upon returning into room and seeing husband and needed redirection to safely use RW and to focus on task      Exercises      General Comments        Pertinent Vitals/Pain Pain Assessment: Faces  Faces Pain Scale: Hurts little more Pain Location: "all over" Pain Descriptors / Indicators: Aching Pain Intervention(s): Monitored during session;Limited activity within patient's  tolerance;Repositioned;Patient requesting pain meds-RN notified    Home Living                      Prior Function            PT Goals (current goals can now be found in the care plan section) Progress towards PT goals: Progressing toward goals    Frequency    Min 3X/week      PT Plan Current plan remains appropriate    Co-evaluation              AM-PAC PT "6 Clicks" Daily Activity  Outcome Measure  Difficulty turning over in bed (including adjusting bedclothes, sheets and blankets)?: None Difficulty moving from lying on back to sitting on the side of the bed? : None Difficulty sitting down on and standing up from a chair with arms (e.g., wheelchair, bedside commode, etc,.)?: Total Help needed moving to and from a bed to chair (including a wheelchair)?: A Little Help needed walking in hospital room?: A Little Help needed climbing 3-5 steps with a railing? : A Little 6 Click Score: 18    End of Session Equipment Utilized During Treatment: Gait belt Activity Tolerance: Patient tolerated treatment well Patient left: in chair;with call bell/phone within reach;with nursing/sitter in room Nurse Communication: Mobility status PT Visit Diagnosis: History of falling (Z91.81);Muscle weakness (generalized) (M62.81);Other abnormalities of gait and mobility (R26.89)     Time: 3524-8185 PT Time Calculation (min) (ACUTE ONLY): 32 min  Charges:  $Gait Training: 8-22 mins $Therapeutic Activity: 8-22 mins                    G Codes:       Earney Navy, PTA Pager: [phone]     Darliss Cheney 02/15/2017, 1:39 PM

## 2017-02-15 NOTE — Care Management Note (Signed)
Case Management Note  Patient Details  Name: AQUARIUS TREMPER MRN: [id number] Date of Birth: [birth year]  Subjective/Objective:    From home with spouse,  Presents with encephalopathy, chronic pain and depression, htn urgency, withdrawal syndrome (opiods and BZD),  Dehydration with hyponatremia, hyperglycemia,  Copd, chf, urinary retention, severe malnutrition,  she has hx of bipolar,  lung cancer with mets to brain. cxr shows possible infiltrarte.   Per pt eval rec HHPT.  NCM spoke with patient , she states at this time she does not wan HHPT.  I informed her that we will check with her later before dc to see if she has changed her mind.                Action/Plan: NCM will follow for dc needs.  Expected Discharge Date:  02/16/17               Expected Discharge Plan:  [place]  In-House Referral:     Discharge planning Services  CM Consult  Post Acute Care Choice:    Choice offered to:     DME Arranged:    DME Agency:     HH Arranged:    HH Agency:     Status of Service:  In process, will continue to follow  If discussed at Long Length of Stay Meetings, dates discussed:    Additional Comments:  Zenon Mayo, RN 02/15/2017, 12:27 PM

## 2017-02-16 ENCOUNTER — Inpatient Hospital Stay (HOSPITAL_COMMUNITY): Payer: Medicare Other

## 2017-02-16 ENCOUNTER — Ambulatory Visit: Payer: Medicare Other | Admitting: Radiation Oncology

## 2017-02-16 DIAGNOSIS — J189 Pneumonia, unspecified organism: Secondary | ICD-10-CM

## 2017-02-16 DIAGNOSIS — R109 Unspecified abdominal pain: Secondary | ICD-10-CM

## 2017-02-16 LAB — BASIC METABOLIC PANEL
Anion gap: 5 (ref 5–15)
BUN: 33 mg/dL — ABNORMAL HIGH (ref 6–20)
CHLORIDE: 98 mmol/L — AB (ref 101–111)
CO2: 26 mmol/L (ref 22–32)
CREATININE: 1.14 mg/dL — AB (ref 0.44–1.00)
Calcium: 9.1 mg/dL (ref 8.9–10.3)
GFR calc non Af Amer: 50 mL/min — ABNORMAL LOW (ref >60)
GFR, EST AFRICAN AMERICAN: 58 mL/min — AB (ref >60)
Glucose, Bld: 186 mg/dL — ABNORMAL HIGH (ref 65–99)
POTASSIUM: 4.2 mmol/L (ref 3.5–5.1)
Sodium: 129 mmol/L — ABNORMAL LOW (ref 135–145)

## 2017-02-16 LAB — GLUCOSE, CAPILLARY
GLUCOSE-CAPILLARY: 209 mg/dL — AB (ref 65–99)
GLUCOSE-CAPILLARY: 343 mg/dL — AB (ref 65–99)
Glucose-Capillary: 182 mg/dL — ABNORMAL HIGH (ref 65–99)
Glucose-Capillary: 550 mg/dL (ref 65–99)

## 2017-02-16 LAB — CBC
HCT: 45.2 % (ref 36.0–46.0)
Hemoglobin: 15.2 g/dL — ABNORMAL HIGH (ref 12.0–15.0)
MCH: 30.7 pg (ref 26.0–34.0)
MCHC: 33.6 g/dL (ref 30.0–36.0)
MCV: 91.3 fL (ref 78.0–100.0)
Platelets: 225 10*3/uL (ref 150–400)
RBC: 4.95 MIL/uL (ref 3.87–5.11)
RDW: 12.9 % (ref 11.5–15.5)
WBC: 8.1 10*3/uL (ref 4.0–10.5)

## 2017-02-16 MED ORDER — INSULIN ASPART 100 UNIT/ML ~~LOC~~ SOLN
10.0000 [IU] | Freq: Once | SUBCUTANEOUS | Status: AC
  Administered 2017-02-16: 10 [IU] via SUBCUTANEOUS

## 2017-02-16 MED ORDER — INSULIN GLARGINE 100 UNIT/ML ~~LOC~~ SOLN
5.0000 [IU] | Freq: Every day | SUBCUTANEOUS | Status: DC
  Administered 2017-02-16: 5 [IU] via SUBCUTANEOUS
  Filled 2017-02-16: qty 0.05

## 2017-02-16 NOTE — Progress Notes (Signed)
[facility]                                                                              Patient Demographics  Phyllis [name], is a 61 y.o. female, DOB - Jan 13, 1956, WHK:[id number]  Admit date - 02/12/2017   Admitting Physician Elwin Mocha, MD  Outpatient Primary MD for the patient is [name], MD  Outpatient specialists:   LOS - 4  days    Chief Complaint  Patient presents with  . Shortness of Breath       Brief summary   Patient is a 61 year old female with hypertension, chronic pain due to osteoarthritis, bipolar disorder, chronic diastolic CHF, COPD, dyslipidemia, chronic pancreatitis, non-small cell lung cancer diagnosed in 2017. Patient was admitted from 5/8- 5/11 at [facility] for metabolic encephalopathy likely due to opiate misuse and taking excessively for chronic pain and depression. (Per husband, possibly buying from street). On DC summary 5/11, patient was not given any further prescriptions  for narcotics. Patient presented back to [facility] ED on 5/12 with multiple complaints including shortness of breath, headache, "hurting all over" and aggressive behavior. She has a history of lung cancer with metastases to brain. Chest x-ray revealed possible infiltrate in the left lower lobe. Patient was admitted to hospitalist service for further workup and Management. She was admitted for possible HCAP  Assessment & Plan    Principal Problem:   Hypertensive urgency - Patient had presented with hypertensive urgency in the setting of noncompliance with medications and possible polysubstance withdrawal - Continue low-dose Coreg. Clonidine was increased to 0.1 mg twice a day, continue hydralazine as needed with parameters - Blood pressures better controlled and reasonable.  Active Problems:   Withdrawal syndrome (opiods and BZDs) with underlying history of bipolar disorder - Continue clonidine, Toradol,  Ultram for pain control. Has used  Ultram maybe up to 2 times daily over the last 2 days. - Psychiatry was consulted, appreciate recommendations, continue low-dose Ativan for psychosis/agitation during withdrawals, continue low-dose Cymbalta 30 mg daily. Patient has not used any lorazepam. - Seizure precautions  - Patient strongly counseled against misuse of opioids. Dr. Tana Coast discussed with patient's husband at the bedside as well.      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 patient was supposed to discharge home on 5/16 however due to inability to void, patient and spouse reluctant to go home today. Plan to call radiation oncology on 5/17 morning to coordinate radiation treatment. - If patient medically ready, DC home in a.m.     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    Abnormal chest x-ray/-Concerning for possible infiltrate versus atelectasis -CT chest showed no findings of pneumonia, 1.5 x 1.8 cm left lower lobe nodule corresponding to patient's known bronchogenic neoplasm. 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 Add low-dose Lantus 5  units at bedtime tonight. At discharge, consider glipizide 2.5 MG daily which can be adjusted as outpatient.    COPD (chronic obstructive pulmonary disease)  -Currently asymptomatic without wheezing    Chronic diastolic heart failure, NYHA class 1  -Currently compensated but monitor closely with volume resuscitation  Urinary retention - PVR over 400, place Foley catheter, UA negative for UTI. Foley catheter was discontinued this morning at approximately 11 AM and patient has not voided until this evening. Repeat bladder scan showed 85 ML of urine. Patient and spouse reluctant to return home without voiding prior to discharge.  Severe malnutrition in context of metastatic malignancy  - nutrition consult was obtained  Abdominal pain - Suspect chronic issue. I reviewed abdominal x-ray which showed no acute findings but has chronic calcific pancreatitis which may also be a cause of pain.  Code Status: full code  DVT Prophylaxis:  Lovenox  Family Communication: None at bedside.  Disposition Plan: Possible discharge home 5/17.  Time Spent in minutes   25 minutes  Procedures:  CT chest   Consultants:   None   Antimicrobials:   Vancomycin IV 1 dose  Cefepime IV 1 dose   Medications  Scheduled Meds: . carvedilol  3.125 mg Oral BID WC  . cloNIDine  0.1 mg Oral BID  . dexamethasone  4 mg Oral Q8H  . DULoxetine  30 mg Oral Daily  . enoxaparin (LOVENOX) injection  40 mg Subcutaneous Q24H  . folic acid  1 mg Oral Daily  . gabapentin  300 mg Oral BID  . insulin aspart  0-5 Units Subcutaneous QHS  . insulin aspart  0-9 Units Subcutaneous TID WC  . ketorolac  15 mg Intravenous Q6H  . levETIRAcetam  500 mg Oral BID  . multivitamin with minerals  1 tablet Oral Daily  . mupirocin cream   Topical BID  . pantoprazole  40 mg Oral Daily  . sodium chloride flush  3 mL Intravenous Q12H  . thiamine  100 mg Oral Daily   Continuous Infusions:  PRN Meds:.acetaminophen **OR**  acetaminophen, hydrALAZINE, ondansetron **OR** ondansetron (ZOFRAN) IV, traMADol   Antibiotics   Anti-infectives    Start     Dose/Rate Route Frequency Ordered Stop   02/12/17 1630  vancomycin (VANCOCIN) 500 mg in sodium chloride 0.9 % 100 mL IVPB  Status:  Discontinued     500 mg 100 mL/hr over 60 Minutes Intravenous Every 12 hours 02/12/17 1604 02/12/17 1821   02/12/17 1600  ceFEPIme (MAXIPIME) 1 g in dextrose 5 % 50 mL IVPB     1 g 100 mL/hr over 30 Minutes Intravenous  Once 02/12/17 1540 02/12/17 1802        Subjective:   Sephira Zellman was seen and examined today. Continues to complain of chronic pain at different sites. Tolerated diet without nausea or vomiting. States that she had BM yesterday and is passing flatus.  Objective:   Vitals:   02/16/17 0757 02/16/17 0914 02/16/17 1350 02/16/17 1712  BP: [name])  147/69 (!) 143/70 (!) 106/52 (!) 157/80  Pulse: 66  76 62  Resp:   18   Temp:   98.4 F (36.9 C)   TempSrc:   Oral   SpO2:   100%   Weight:      Height:        Intake/Output Summary (Last 24 hours) at 02/16/17 1901 Last data filed at 02/16/17 1300  Gross per 24 hour  Intake              231 ml  Output              500 ml  Net             -269 ml     Wt Readings from Last 3 Encounters:  02/16/17 53.5 kg (117 lb 15.1 oz)  02/11/17 52.3 kg (115 lb 4.8 oz)  01/19/17 51.3 kg (113 lb)     Exam  General: Middle-aged female, small built, frail and chronically ill-looking lying comfortably propped up in bed.  Cardiovascular: S1-S2 clear, regular rate and rhythm. No JVD, murmurs or pedal edema. Telemetry: Sinus rhythm.  Respiratory: Clear to auscultation bilaterally  Gastrointestinal: Soft, nontender, nondistended, normal bowel sounds  Ext: no cyanosis, clubbing, edema  Neuro: no neuro deficits  Skin: No rashes  Psych: alert and oriented 3, NAD   Data Reviewed:  I have personally reviewed following labs and imaging studies  Micro Results No  results found for this or any previous visit (from the past 240 hour(s)).  Radiology Reports Dg Chest 2 View  Result Date: 02/12/2017 CLINICAL DATA:  Acute chest pain and shortness of breath. EXAM: CHEST  2 VIEW COMPARISON:  01/11/2017 chest radiograph and prior studies FINDINGS: The cardiomediastinal silhouette is unremarkable. COPD/emphysema noted. Streaky lingular opacity is noted and may represent atelectasis or mild airspace disease/ pneumonia. There is no evidence of pulmonary edema, suspicious pulmonary nodule/mass, pleural effusion, or pneumothorax. No acute bony abnormalities are identified. IMPRESSION: Streaky lingular opacity - question mild atelectasis or mild airspace disease/ pneumonia. COPD/emphysema. Electronically Signed   By: Margarette Canada M.D.   On: 02/12/2017 15:18   Ct Head Wo Contrast  Result Date: 02/08/2017 CLINICAL DATA:  Recent brain surgery. Hallucinations. Poorly differentiated non-small cell lung cancer. EXAM: CT HEAD WITHOUT CONTRAST TECHNIQUE: Contiguous axial images were obtained from the base of the skull through the vertex without intravenous contrast. COMPARISON:  CT head 01/13/2017.  MR head 01/11/2017. FINDINGS: Brain: Status post LEFT parietal burr hole for stereotactic biopsy of a LEFT hemisphere mass, with subsequent diagnosis of poorly differentiated non small cell lung cancer. The mass remains hyperattenuating on noncontrast exam, but is not frankly hemorrhagic. There is mild associated vasogenic edema. There may be slight interval growth from priors, up to 3 cm long axis as compared to 2.2 cm previously, without LEFT-to-RIGHT shift. Mild mass effect on the LEFT lateral ventricle. No other mass lesions are identified on this noncontrast exam. Vascular: No hyperdense vessel or unexpected calcification. Skull: Aside from the burr hole, unremarkable. Surgical skin staples have not been removed. Sinuses/Orbits: Unremarkable. Other: None. IMPRESSION: Suspected interval  growth of the hyperattenuating LEFT parietal periventricular intracranial metastasis, with mild surrounding edema but no apparent acute findings. No postsurgical complication is evident. Electronically Signed   By: Staci Righter M.D.   On: 02/08/2017 20:19   Ct Chest Wo Contrast  Result Date: 02/12/2017 CLINICAL DATA:  Follow-up Healthcare acquired pneumonia EXAM: CT CHEST WITHOUT CONTRAST TECHNIQUE: Multidetector CT imaging of  the chest was performed following the standard protocol without IV contrast. COMPARISON:  Chest radiographs dated 02/12/2017. CT chest dated 11/18/2016. FINDINGS: Cardiovascular: The heart is top-normal in size. Trace pericardial effusion. Three vessel coronary atherosclerosis. No evidence of thoracic aortic aneurysm. Atherosclerotic calcifications of the aortic arch. Mediastinum/Nodes: 2.1 cm short axis node along the right azygoesophageal recess (series 3/image 37), previously 1.9 cm. Bilateral hilar regions are poorly evaluated in the absence intravenous contrast administration. No suspicious axillary lymphadenopathy. Visualized right thyroid is unremarkable. Left thyroid is not discretely visualized. Lungs/Pleura: Moderate centrilobular and paraseptal emphysematous changes, upper lobe predominant. Left lower lobe pulmonary sequestration. 1.5 x 1.8 cm left lower lobe nodule, grossly unchanged. This corresponds to the patient's known primary bronchogenic neoplasm status post stereotactic radiation. Additional branching mucous plugging in the posterior left lower lobe (series 4/images 108-112). Mild nodular scarring in the posterior right lower lobe (series 4/ image 87), improved. Mild lingular scarring with bronchiectasis (series 4/ image 92). No superimposed opacities suspicious for pneumonia. No pleural effusion or pneumothorax. Upper Abdomen: Cirrhosis. Status post cholecystectomy. Sequela of prior/ chronic pancreatitis. Mild nodular thickening of the bilateral adrenal glands without  discrete mass. Mild thickening of the proximal stomach, although favoring underdistention. Musculoskeletal: Mild superior endplate changes at Q6-5. IMPRESSION: No findings suspicious for pneumonia. 1.5 x 1.8 cm left lower lobe nodule, grossly unchanged, corresponding to the patient's known primary bronchogenic neoplasm status post stereotactic radiation. 2.1 cm short axis node along the right azygoesophageal recess, suspicious for nodal metastasis, mildly increased. Prior posterior right lower lobe nodule/neoplasm is improved, with minimal residual nodular scarring. Additional ancillary findings as above. Electronically Signed   By: Julian Hy M.D.   On: 02/12/2017 19:42   Mr Jeri Cos HQ Contrast  Result Date: 02/10/2017 CLINICAL DATA:  Brain mass status post craniotomy. EXAM: MRI HEAD WITHOUT AND WITH CONTRAST TECHNIQUE: Multiplanar, multiecho pulse sequences of the brain and surrounding structures were obtained without and with intravenous contrast. CONTRAST:  14m MULTIHANCE GADOBENATE DIMEGLUMINE 529 MG/ML IV SOLN COMPARISON:  Head CT 02/08/2017 Brain MRI 01/11/2017 FINDINGS: Brain: The midline structures are normal. No focal diffusion restriction to indicate acute infarct. No intraparenchymal hemorrhage. The contrast-enhancing mass within the periatrial left parietal white matter, involving the left aspect of the splenium of the corpus callosum, now measures 3.5 x 2.4 cm, previously 3.1 x 1.8 cm. The degree of associated hyperintense T2 weighted signal is also increased. Diffusion-weighted imaging shows likely involvement of the contralateral corpus callosum splenium. There is mass effect on the atrium of the left lateral ventricle, but no midline shift. There are no new contrast-enhancing lesions. Multiple round low T1, high T2 weighted signal foci within the periventricular and deep white matter are unchanged. There is hemosiderin again seen within the mass. No hydrocephalus. Vascular: Major  intracranial arterial and venous sinus flow voids are preserved. Skull and upper cervical spine: Left parietal burr hole. Sinuses/Orbits: No fluid levels or advanced mucosal thickening. No mastoid effusion. Normal orbits. IMPRESSION: 1. Increased size of mass located in the left parietal periatrial white matter, involving the splenium of the corpus callosum. Biopsy results show metastatic lung carcinoma. 2. Increased T2 weighted signal hyperintensity surrounding the lesion, likely a combination of edema related to tumor growth and recent biopsy. 3. Unchanged appearance of multiple round foci of signal abnormality without contrast enhancement, which may indicate chronic demyelinating lesions. Electronically Signed   By: KUlyses JarredM.D.   On: 02/10/2017 20:11   Dg Abd Portable 1v  Result Date:  02/16/2017 CLINICAL DATA:  Generalized abdominal pain over the last 4 days. History of chronic pancreatitis. EXAM: PORTABLE ABDOMEN - 1 VIEW COMPARISON:  CT 05/27/2016 FINDINGS: Gas pattern is normal without evidence of ileus or obstruction. Chronic pancreatic calcification again noted. Previous lumbosacral spinal fusion. IMPRESSION: No acute radiographic finding. Gas pattern unremarkable. Chronic calcific pancreatitis. Electronically Signed   By: Nelson Chimes M.D.   On: 02/16/2017 11:18    Lab Data:  CBC:  Recent Labs Lab 02/12/17 1210 02/12/17 1222 02/13/17 0304 02/14/17 0330 02/15/17 0308 02/16/17 0855  WBC 6.1  --  6.1 5.8 6.5 8.1  HGB 15.9* 17.3* 15.7* 15.7* 15.2* 15.2*  HCT 46.6* 51.0* 45.1 46.1* 44.9 45.2  MCV 91.4  --  89.8 90.0 90.7 91.3  PLT 249  --  217 229 252 599   Basic Metabolic Panel:  Recent Labs Lab 02/12/17 1331 02/12/17 2224 02/13/17 0304 02/14/17 0330 02/15/17 0308 02/16/17 0855  NA 127*  --  126* 129* 128* 129*  K 4.1  --  3.5 3.8 4.5 4.2  CL 94*  --  94* 97* 97* 98*  CO2 25  --  _0 GLUCOSE 205*  --  181* 150* 181* 186*  BUN 23*  --  19 25* 35* 33*    CREATININE 0.94  --  0.73 0.91 1.21* 1.14*  CALCIUM 9.1  --  8.4* 8.8* 9.0 9.1  MG  --  1.5*  --   --   --   --   PHOS  --  2.5  --   --   --   --    GFR: Estimated Creatinine Clearance: 43.2 mL/min (A) (by C-G formula based on SCr of 1.14 mg/dL (H)). Liver Function Tests:  Recent Labs Lab 02/12/17 1437 02/13/17 0304  AST 39 31  ALT 33 32  ALKPHOS 75 66  BILITOT 0.7 0.6  PROT 6.0* 5.4*  ALBUMIN 3.2* 2.9*   CBG:  Recent Labs Lab 02/15/17 1736 02/15/17 2112 02/16/17 0756 02/16/17 1210 02/16/17 1704  GLUCAP 306* 248* 182* 209* 343*   Urine analysis:    Component Value Date/Time   COLORURINE STRAW (A) 02/12/2017 0011   APPEARANCEUR CLEAR 02/12/2017 0011   LABSPEC 1.010 02/12/2017 0011   PHURINE 7.0 02/12/2017 0011   GLUCOSEU 50 (A) 02/12/2017 0011   GLUCOSEU NEGATIVE 12/16/2016 1444   HGBUR NEGATIVE 02/12/2017 0011   BILIRUBINUR NEGATIVE 02/12/2017 0011   KETONESUR NEGATIVE 02/12/2017 0011   PROTEINUR NEGATIVE 02/12/2017 0011   UROBILINOGEN 0.2 12/16/2016 1444   NITRITE NEGATIVE 02/12/2017 0011   LEUKOCYTESUR NEGATIVE 02/12/2017 0011     Kamera Dubas, MD, FACP, FHM. [facility] Pager [phone]  If 7PM-7AM, please contact night-coverage [email or web address] Password [email or web address] 02/16/2017, 7:10 PM

## 2017-02-16 NOTE — Care Management Important Message (Signed)
Important Message  Patient Details  Name: Phyllis [name] MRN: [id number] Date of Birth: 06-02-1956   Medicare Important Message Given:  Yes    Nathen May 02/16/2017, 12:16 PM

## 2017-02-16 NOTE — Progress Notes (Addendum)
Inpatient Diabetes Program Recommendations  AACE/ADA: New Consensus Statement on Inpatient Glycemic Control (2015)  Target Ranges:  Prepandial:   less than 140 mg/dL      Peak postprandial:   less than 180 mg/dL (1-2 hours)      Critically ill patients:  140 - 180 mg/dL   Review of Glycemic Control  Current orders for Inpatient glycemic control: Novolog Sensitive Correction scale tid + Novolog HS scale  Inpatient Diabetes Program Recommendations:    Glucose increased into the 300's after meals during the day. Patient on Decadron. Consider Novolog 5 units tid meal coverage.  Thanks,  Tama Headings RN, MSN, [facility] Inpatient Diabetes Coordinator Team Pager [phone] (8a-5p)

## 2017-02-17 ENCOUNTER — Encounter: Payer: Self-pay | Admitting: *Deleted

## 2017-02-17 ENCOUNTER — Ambulatory Visit
Admission: RE | Admit: 2017-02-17 | Discharge: 2017-02-17 | Disposition: A | Discharge status: Home / Self Care | Payer: Medicare Other | Source: Ambulatory Visit | Attending: Radiation Oncology | Admitting: Radiation Oncology

## 2017-02-17 ENCOUNTER — Telehealth: Payer: Self-pay | Admitting: Internal Medicine

## 2017-02-17 VITALS — BP 154/77 | HR 70 | Temp 97.6°F | Resp 16

## 2017-02-17 DIAGNOSIS — F1123 Opioid dependence with withdrawal: Secondary | ICD-10-CM

## 2017-02-17 DIAGNOSIS — E871 Hypo-osmolality and hyponatremia: Secondary | ICD-10-CM

## 2017-02-17 DIAGNOSIS — I161 Hypertensive emergency: Secondary | ICD-10-CM

## 2017-02-17 DIAGNOSIS — C7931 Secondary malignant neoplasm of brain: Secondary | ICD-10-CM

## 2017-02-17 DIAGNOSIS — E118 Type 2 diabetes mellitus with unspecified complications: Secondary | ICD-10-CM

## 2017-02-17 DIAGNOSIS — C3490 Malignant neoplasm of unspecified part of unspecified bronchus or lung: Secondary | ICD-10-CM

## 2017-02-17 DIAGNOSIS — I5032 Chronic diastolic (congestive) heart failure: Secondary | ICD-10-CM

## 2017-02-17 LAB — GLUCOSE, CAPILLARY
Glucose-Capillary: 100 mg/dL — ABNORMAL HIGH (ref 65–99)
Glucose-Capillary: 144 mg/dL — ABNORMAL HIGH (ref 65–99)

## 2017-02-17 MED ORDER — BLOOD GLUCOSE MONITOR KIT: PACK | 0 refills | Status: AC

## 2017-02-17 MED ORDER — DULOXETINE HCL 30 MG PO CPEP: 30.0000 mg | ORAL_CAPSULE | Freq: Every day | ORAL | 0 refills | Status: DC

## 2017-02-17 MED ORDER — PANTOPRAZOLE SODIUM 40 MG PO TBEC: 40.0000 mg | DELAYED_RELEASE_TABLET | Freq: Every day | ORAL | 0 refills | Status: DC

## 2017-02-17 MED ORDER — LEVETIRACETAM 500 MG PO TABS: 500.0000 mg | ORAL_TABLET | Freq: Two times a day (BID) | ORAL | 0 refills | Status: DC

## 2017-02-17 MED ORDER — MUPIROCIN CALCIUM 2 % EX CREA: TOPICAL_CREAM | Freq: Two times a day (BID) | CUTANEOUS | 0 refills | Status: DC

## 2017-02-17 MED ORDER — TRAMADOL HCL 50 MG PO TABS
50.0000 mg | ORAL_TABLET | Freq: Three times a day (TID) | ORAL | 0 refills | Status: DC | PRN
Start: 2017-02-17 — End: 2017-03-02

## 2017-02-17 MED ORDER — DEXAMETHASONE 4 MG PO TABS: ORAL_TABLET | ORAL | 0 refills | Status: DC

## 2017-02-17 MED ORDER — CARVEDILOL 6.25 MG PO TABS: ORAL_TABLET | ORAL | 0 refills | Status: DC

## 2017-02-17 MED ORDER — GABAPENTIN 300 MG PO CAPS: 300.0000 mg | ORAL_CAPSULE | Freq: Two times a day (BID) | ORAL | 0 refills | Status: AC

## 2017-02-17 MED ORDER — GLIPIZIDE 5 MG PO TABS: 2.5000 mg | ORAL_TABLET | Freq: Every day | ORAL | 0 refills | Status: DC

## 2017-02-17 NOTE — Discharge Summary (Signed)
Physician Discharge Summary  Phyllis [name] YIR:[id number] DOB: May 17, 1956  PCP: [name], MD  Admit date: 02/12/2017 Discharge date: 02/17/2017  Recommendations for Outpatient Follow-up:  1. Dr. [name], PCP in 4 days with repeat labs (CBC). Also follow-up regarding further management of diabetes. 2. Dr. [name], Radiation Oncology: Patient had appointment on 02/17/17 at 11:30 AM and spouse was going to take her there post discharge this morning. 3. Dr. Curt Bears, Medical Oncology  Home Health: PT and RN Equipment/Devices: None    Discharge Condition: Improved and stable  CODE STATUS: Full  Diet recommendation: Heart healthy and diabetic diet.  Discharge Diagnoses:  Principal Problem:   Hypertensive urgency Active Problems:   Withdrawal syndrome (opiods and BZDs)   Dehydration with hyponatremia   Abnormal chest x-ray   Non-small cell carcinoma of lung (HCC)   Solitary brain metastasis   Bipolar disease, chronic (HCC)   Acute hyperglycemia   COPD (chronic obstructive pulmonary disease) (HCC)   Chronic diastolic heart failure, NYHA class 1 ([place])   Brief Summary: Patient is a 61 year old female with hypertension, chronic pain due to osteoarthritis, bipolar disorder, chronic diastolic CHF, COPD, dyslipidemia, chronic pancreatitis, non-small cell lung cancer diagnosed in 2017. Patient was admitted from 5/8- 5/11 at [facility] for metabolic encephalopathy likely due to opiate misuse and taking excessively for chronic pain and depression. (Per husband, possibly buying from street). On DC summary 5/11, patient was not given any further prescriptions  for narcotics. Patient presented back to [facility] ED on 5/12 with multiple complaints including shortness of breath, headache, "hurting all over" and aggressive behavior. She has a history of lung cancer with metastases to brain. Chest x-ray revealed possible infiltrate in the left lower lobe. Patient  was admitted to hospitalist service for further workup and Management. She was admitted for possible HCAP  Assessment and plan:  Principal Problem:   Hypertensive urgency - Patient had presented with hypertensive urgency in the setting of noncompliance with medications and possible polysubstance withdrawal - She was continued on reduced dose of carvedilol and clonidine was increased to 0.1 MG 2 times daily. At discharge, will increase carvedilol to prior home dose of 6.25 MG twice daily and discontinue clonidine. This will help with compliance. - Reasonable control.  Active Problems:   Withdrawal syndrome (opiods and BZDs) with underlying history of bipolar disorder - Clinically improved. No overt withdrawal features. Has not used any benzodiazepines in the hospital. Has been using up to 2-3 doses of tramadol for pain. - Psychiatry was consulted and Cymbalta was started, continue same at discharge. - Patient strongly counseled against misuse of opioids. I personally discussed with spouse on day of discharge and advised him that all medications should be kept out of her reach and need to be administered by someone else and he verbalized understanding.  Non-small cell carcinoma of lung with Brain metastases  -MRI brain on 5/10 has revealed increased size of mass -Patient noncompliant with Decadron prior to admission, IV Decadron was transitioned to oral Decadron - On day of discharge, coordinated care with radiation oncology service. Patient was discharged early in the morning and her spouse came by to pick her up and take her to radiation treatment appointment at the cancer center. As discussed with oncology team, patient will be discharged on Decadron 4 MG 3 times a day for a week and then taper. This can be managed as deemed necessary by the oncology service.   Dehydration with hyponatremia -Treated with  brief IV fluids. 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  Abnormal chest x-ray/-Concerning for possible infiltrate versus atelectasis -CT chest showed no findings of pneumonia, 1.5 x 1.8 cm left lower lobe nodule corresponding to patient's known bronchogenic neoplasm. 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 At discharge, patient will be discharged on glipizide 2.5 MG daily. Spouse was counseled regarding monitoring of CBGs at home, all other aspects of diabetes management and patient needs to follow-up with PCP. I also discussed with oncology service that when her steroids are tapered, her diabetic medications will need to be adjusted to avoid hypoglycemia.  COPD (chronic obstructive pulmonary disease)  -No clinical bronchospasm.  Chronic diastolic heart failure, NYHA class 1  -Compensated.  Urinary retention - UA negative for UTI.  - Foley catheter was placed couple days ago for PVR >400. Catheter was discontinued on 5/16 and patient did not void until later that night which delayed her discharge by her day. She has since voided. Acute urinary retention resolved.  Severe malnutrition in context of metastatic malignancy - nutrition consult was obtained  Abdominal pain - Suspect chronic issue. Abdominal x-ray showed no acute findings but has chronic calcific pancreatitis which may also be a cause of  pain.   Consultations:  Psychiatry  Procedures:  None   Discharge Instructions  Discharge Instructions    Call MD for:    Complete by:  As directed    Confusion or altered mental status.   Call MD for:  difficulty breathing, headache or visual disturbances    Complete by:  As directed    Call MD for:  extreme fatigue    Complete by:  As directed    Call MD for:  persistant dizziness or light-headedness    Complete by:  As directed    Call MD for:  persistant nausea and vomiting    Complete by:  As directed    Call MD for:  severe uncontrolled pain    Complete by:  As directed    Call MD for:  temperature >100.4    Complete by:  As directed    Diet - low sodium heart healthy    Complete by:  As directed    Diet Carb Modified    Complete by:  As directed    Increase activity slowly    Complete by:  As directed        Medication List    STOP taking these medications   acetaminophen 325 MG tablet Commonly known as:  TYLENOL   Albuterol Sulfate 108 (90 Base) MCG/ACT Aepb Commonly known as:  PROAIR RESPICLICK   atorvastatin 40 MG tablet Commonly known as:  LIPITOR   cloNIDine 0.1 MG tablet Commonly known as:  CATAPRES   famotidine 20 MG tablet Commonly known as:  PEPCID   triamcinolone ointment 0.1 % Commonly known as:  KENALOG     TAKE these medications   blood glucose meter kit  and supplies Kit Dispense based on patient and insurance preference. Use up to four times daily as directed. (FOR ICD-9 250.00, 250.01).   carvedilol 6.25 MG tablet Commonly known as:  COREG TAKE 1 TABLET(6.25 MG) BY MOUTH TWICE DAILY WITH A MEAL   dexamethasone 4 MG tablet Commonly known as:  DECADRON Take 1 tablet 3 times daily for 1 week, then reduce to 1 tablet 2 times daily or dose as advised by your Oncologist. What changed:  additional instructions   DULoxetine 30 MG capsule Commonly known as:  CYMBALTA Take 1 capsule (30 mg total) by mouth daily.   gabapentin  300 MG capsule Commonly known as:  NEURONTIN Take 1 capsule (300 mg total) by mouth 2 (two) times daily. What changed:  See the new instructions.   glipiZIDE 5 MG tablet Commonly known as:  GLUCOTROL Take 0.5 tablets (2.5 mg total) by mouth daily before breakfast.   levETIRAcetam 500 MG tablet Commonly known as:  KEPPRA Take 1 tablet (500 mg total) by mouth 2 (two) times daily.   mupirocin cream 2 % Commonly known as:  BACTROBAN Apply topically 2 (two) times daily. Apply to skin tear on left lower leg   pantoprazole 40 MG tablet Commonly known as:  PROTONIX Take 1 tablet (40 mg total) by mouth daily. Start taking on:  02/18/2017   traMADol 50 MG tablet Commonly known as:  ULTRAM Take 1 tablet (50 mg total) by mouth every 8 (eight) hours as needed for moderate pain or severe pain.      Follow-up Information    [name], MD Follow up.   Specialty:  Radiation Oncology Why:  Patient has appointment this morning (02/17/17) at 11:30 AM and is strongly advised to keep that appointment. Contact information: [place] [address] Jaconita, MD. Schedule an appointment as soon as possible for a visit.   Specialty:  Oncology Contact information: [place] [address] [phone]        [name], MD. Schedule an appointment as soon as possible for a visit in 4 day(s).   Specialty:  Internal Medicine Why:  Follow-up regarding further management of high blood sugar/diabetes. To be seen with repeat labs (CBC). Contact information: [address] [place] [address] [phone]          Allergies  Allergen Reactions  . Amlodipine Swelling  . Meperidine Hcl Other (See Comments)    hallucinations  . Naproxen Other (See Comments)    Extreme bruising      Procedures/Studies: Dg Chest 2 View  Result Date: 02/12/2017 CLINICAL DATA:  Acute chest pain and  shortness of breath. EXAM: CHEST  2 VIEW COMPARISON:  01/11/2017 chest radiograph and prior studies FINDINGS: The cardiomediastinal silhouette is unremarkable. COPD/emphysema noted. Streaky lingular opacity is noted and may represent atelectasis or mild airspace disease/ pneumonia. There is no evidence of pulmonary edema, suspicious pulmonary nodule/mass, pleural effusion, or pneumothorax. No acute bony abnormalities are identified. IMPRESSION: Streaky lingular opacity - question mild atelectasis or mild airspace disease/ pneumonia. COPD/emphysema. Electronically Signed   By: Margarette Canada M.D.   On: 02/12/2017 15:18   Ct Chest Wo Contrast  Result Date: 02/12/2017 CLINICAL DATA:  Follow-up Healthcare acquired pneumonia EXAM: CT CHEST WITHOUT CONTRAST TECHNIQUE: Multidetector CT imaging of the chest was performed following the standard protocol without IV contrast. COMPARISON:  Chest radiographs dated 02/12/2017. CT chest dated 11/18/2016.  FINDINGS: Cardiovascular: The heart is top-normal in size. Trace pericardial effusion. Three vessel coronary atherosclerosis. No evidence of thoracic aortic aneurysm. Atherosclerotic calcifications of the aortic arch. Mediastinum/Nodes: 2.1 cm short axis node along the right azygoesophageal recess (series 3/image 37), previously 1.9 cm. Bilateral hilar regions are poorly evaluated in the absence intravenous contrast administration. No suspicious axillary lymphadenopathy. Visualized right thyroid is unremarkable. Left thyroid is not discretely visualized. Lungs/Pleura: Moderate centrilobular and paraseptal emphysematous changes, upper lobe predominant. Left lower lobe pulmonary sequestration. 1.5 x 1.8 cm left lower lobe nodule, grossly unchanged. This corresponds to the patient's known primary bronchogenic neoplasm status post stereotactic radiation. Additional branching mucous plugging in the posterior left lower lobe (series 4/images 108-112). Mild nodular scarring in the  posterior right lower lobe (series 4/ image 87), improved. Mild lingular scarring with bronchiectasis (series 4/ image 92). No superimposed opacities suspicious for pneumonia. No pleural effusion or pneumothorax. Upper Abdomen: Cirrhosis. Status post cholecystectomy. Sequela of prior/ chronic pancreatitis. Mild nodular thickening of the bilateral adrenal glands without discrete mass. Mild thickening of the proximal stomach, although favoring underdistention. Musculoskeletal: Mild superior endplate changes at O1-3. IMPRESSION: No findings suspicious for pneumonia. 1.5 x 1.8 cm left lower lobe nodule, grossly unchanged, corresponding to the patient's known primary bronchogenic neoplasm status post stereotactic radiation. 2.1 cm short axis node along the right azygoesophageal recess, suspicious for nodal metastasis, mildly increased. Prior posterior right lower lobe nodule/neoplasm is improved, with minimal residual nodular scarring. Additional ancillary findings as above. Electronically Signed   By: Julian Hy M.D.   On: 02/12/2017 19:42   Dg Abd Portable 1v  Result Date: 02/16/2017 CLINICAL DATA:  Generalized abdominal pain over the last 4 days. History of chronic pancreatitis. EXAM: PORTABLE ABDOMEN - 1 VIEW COMPARISON:  CT 05/27/2016 FINDINGS: Gas pattern is normal without evidence of ileus or obstruction. Chronic pancreatic calcification again noted. Previous lumbosacral spinal fusion. IMPRESSION: No acute radiographic finding. Gas pattern unremarkable. Chronic calcific pancreatitis. Electronically Signed   By: Nelson Chimes M.D.   On: 02/16/2017 11:18      Subjective: Seen this morning. "Feels so-so". No pain reported to me. Stated that she had a BM yesterday. Tolerating diet without nausea or vomiting. No acute issues reported by RN.  Discharge Exam:  Vitals:   02/16/17 1712 02/16/17 2111 02/17/17 0208 02/17/17 0614  BP: (!) 157/80 140/67 (!) 158/82 (!) 152/79  Pulse: 62 64 71 64  Resp:   _0 Temp:  98 F (36.7 C) 98.7 F (37.1 C) 98.3 F (36.8 C)  TempSrc:  Oral  Oral  SpO2:  96% 95% 98%  Weight:   54.9 kg (121 lb 0.5 oz)   Height:        General: Pleasant middle-aged female, small built, frail, chronically ill-looking lying comfortably supine in bed. Cardiovascular: S1 & S2 heard, RRR, S1/S2 +. No murmurs, rubs, gallops or clicks. No JVD or pedal edema. Respiratory: Clear to auscultation without wheezing, rhonchi or crackles. No increased work of breathing. Abdominal:  Non distended, non tender & soft. No organomegaly or masses appreciated. Normal bowel sounds heard. CNS: Alert and oriented to person and place. No focal deficits. Extremities: no edema, no cyanosis. Dressing over left mid shin skin tear clean and dry. Removed dressing and area looks without acute findings.    The results of significant diagnostics from this hospitalization (including imaging, microbiology, ancillary and laboratory) are listed below for reference.      Labs: CBC:  Recent Labs  Lab 02/12/17 1210 02/12/17 1222 02/13/17 0304 02/14/17 0330 02/15/17 0308 02/16/17 0855  WBC 6.1  --  6.1 5.8 6.5 8.1  HGB 15.9* 17.3* 15.7* 15.7* 15.2* 15.2*  HCT 46.6* 51.0* 45.1 46.1* 44.9 45.2  MCV 91.4  --  89.8 90.0 90.7 91.3  PLT 249  --  217 229 252 675   Basic Metabolic Panel:  Recent Labs Lab 02/12/17 1331 02/12/17 2224 02/13/17 0304 02/14/17 0330 02/15/17 0308 02/16/17 0855  NA 127*  --  126* 129* 128* 129*  K 4.1  --  3.5 3.8 4.5 4.2  CL 94*  --  94* 97* 97* 98*  CO2 25  --  _0 GLUCOSE 205*  --  181* 150* 181* 186*  BUN 23*  --  19 25* 35* 33*  CREATININE 0.94  --  0.73 0.91 1.21* 1.14*  CALCIUM 9.1  --  8.4* 8.8* 9.0 9.1  MG  --  1.5*  --   --   --   --   PHOS  --  2.5  --   --   --   --    Liver Function Tests:  Recent Labs Lab 02/12/17 1437 02/13/17 0304  AST 39 31  ALT 33 32  ALKPHOS 75 66  BILITOT 0.7 0.6  PROT 6.0* 5.4*  ALBUMIN 3.2* 2.9*    BNP (last 3 results)  Recent Labs  01/06/17 0153 02/12/17 1331  BNP 351.2* 412.7*   CBG:  Recent Labs Lab 02/16/17 1210 02/16/17 1704 02/16/17 2130 02/17/17 0232 02/17/17 0803  GLUCAP 209* 343* 550* 100* 144*   Urinalysis    Component Value Date/Time   COLORURINE STRAW (A) 02/12/2017 0011   APPEARANCEUR CLEAR 02/12/2017 0011   LABSPEC 1.010 02/12/2017 0011   PHURINE 7.0 02/12/2017 0011   GLUCOSEU 50 (A) 02/12/2017 0011   GLUCOSEU NEGATIVE 12/16/2016 1444   HGBUR NEGATIVE 02/12/2017 0011   BILIRUBINUR NEGATIVE 02/12/2017 0011   KETONESUR NEGATIVE 02/12/2017 0011   PROTEINUR NEGATIVE 02/12/2017 0011   UROBILINOGEN 0.2 12/16/2016 1444   NITRITE NEGATIVE 02/12/2017 0011   LEUKOCYTESUR NEGATIVE 02/12/2017 0011   Discussed in detail with patient's spouse. Updated care and answered questions.   Time coordinating discharge: Over 30 minutes  SIGNED:  Vernell Leep, MD, FACP, [place]. [facility] Pager [phone]  If 7PM-7AM, please contact night-coverage [email or web address] Password [email or web address] 02/17/2017, 9:10 AM

## 2017-02-17 NOTE — Progress Notes (Signed)
Received patient in the clinic following 1 of 3 srs treatments. Patient is alert and oriented to person and place. Patient denies pain. Vitals stable. Patient denies headache, dizziness, nausea, diplopia or ringing in the ears. Patient tearful and upset she has to remain in the clinic for recovery. Patient expresses she is very anxious to leave the hospital. Patient's husband is present at the bedside. Mont Dutton provided patient's husband with appointment date and time for second treatment. I provided patient's husband with main line to reach on call provider after hours with needs. Instructed patient and her husband that she should avoid strenuous activity for the next 24 hours. Both verbalized understanding.

## 2017-02-17 NOTE — Progress Notes (Signed)
Spoke with patient's husband and informed him of patient's pending discharge and new appointment time of 11:30 at the [facility]. Pt's husband states he is on his way to hospital now.

## 2017-02-17 NOTE — Progress Notes (Signed)
Oncology Nurse Navigator Documentation  Oncology Nurse Navigator Flowsheets 02/17/2017  Navigator Location [facility]  Navigator Encounter Type Other/I updated Dr. Julien Nordmann on Phyllis [name].  He requested to see her in a 3 weeks for follow up.  He states he saw her in the hospital.  I notified scheduling to call and schedule.   Treatment Phase Treatment  Barriers/Navigation Needs Coordination of Care  Interventions Coordination of Care  Coordination of Care Appts  Acuity Level 2  Acuity Level 2 Assistance expediting appointments  Time Spent with Patient 30

## 2017-02-17 NOTE — Progress Notes (Signed)
Patient's CBG was 550.  Per order NP Schorr notified for Novolog orders.  10 units of Novolog ordered and RN gave this medication.  Will continue to monitor patient and notify NP as needed.

## 2017-02-17 NOTE — Discharge Instructions (Addendum)
Please get your medications reviewed and adjusted by your Primary MD. ° °Please request your Primary MD to go over all Hospital Tests and Procedure/Radiological results at the follow up, please get all Hospital records sent to your Prim MD by signing hospital release before you go home. ° °If you had Pneumonia of Lung problems at the Hospital: °Please get a 2 view Chest X ray done in 6-8 weeks after hospital discharge or sooner if instructed by your Primary MD. ° °If you have Congestive Heart Failure: °Please call your Cardiologist or Primary MD anytime you have any of the following symptoms:  °1) 3 pound weight gain in 24 hours or 5 pounds in 1 week  °2) shortness of breath, with or without a dry hacking cough  °3) swelling in the hands, feet or stomach  °4) if you have to sleep on extra pillows at night in order to breathe ° °Follow cardiac low salt diet and 1.5 lit/day fluid restriction. ° °If you have diabetes °Accuchecks 4 times/day, Once in AM empty stomach and then before each meal. °Log in all results and show them to your primary doctor at your next visit. °If any glucose reading is under 80 or above 300 call your primary MD immediately. ° °If you have Seizure/Convulsions/Epilepsy: °Please do not drive, operate heavy machinery, participate in activities at heights or participate in high speed sports until you have seen by Primary MD or a Neurologist and advised to do so again. ° °If you had Gastrointestinal Bleeding: °Please ask your Primary MD to check a complete blood count within one week of discharge or at your next visit. Your endoscopic/colonoscopic biopsies that are pending at the time of discharge, will also need to followed by your Primary MD. ° °Get Medicines reviewed and adjusted. °Please take all your medications with you for your next visit with your Primary MD ° °Please request your Primary MD to go over all hospital tests and procedure/radiological results at the follow up, please ask your  Primary MD to get all Hospital records sent to his/her office. ° °If you experience worsening of your admission symptoms, develop shortness of breath, life threatening emergency, suicidal or homicidal thoughts you must seek medical attention immediately by calling 911 or calling your MD immediately  if symptoms less severe. ° °You must read complete instructions/literature along with all the possible adverse reactions/side effects for all the Medicines you take and that have been prescribed to you. Take any new Medicines after you have completely understood and accpet all the possible adverse reactions/side effects.  ° °Do not drive or operate heavy machinery when taking Pain medications.  ° °Do not take more than prescribed Pain, Sleep and Anxiety Medications ° °Special Instructions: If you have smoked or chewed Tobacco  in the last 2 yrs please stop smoking, stop any regular Alcohol  and or any Recreational drug use. ° °Wear Seat belts while driving. ° °Please note °You were cared for by a hospitalist during your hospital stay. If you have any questions about your discharge medications or the care you received while you were in the hospital after you are discharged, you can call the unit and asked to speak with the hospitalist on call if the hospitalist that took care of you is not available. Once you are discharged, your primary care physician will handle any further medical issues. Please note that NO REFILLS for any discharge medications will be authorized once you are discharged, as it is imperative that you   return to your primary care physician (or establish a relationship with a primary care physician if you do not have one) for your aftercare needs so that they can reassess your need for medications and monitor your lab values.  You can reach the hospitalist office at phone [phone] or fax [phone]   If you do not have a primary care physician, you can call [phone] for a physician  referral.   Type 2 Diabetes Mellitus, Self Care, Adult  Caring for yourself after you have been diagnosed with type 2 diabetes (type 2 diabetes mellitus) means keeping your blood sugar (glucose) under control with a balance of:  Nutrition.  Exercise.  Lifestyle changes.  Medicines or insulin, if necessary.  Support from your team of health care providers and others. The following information explains what you need to know to manage your diabetes at home. What do I need to do to manage my blood glucose?  Check your blood glucose every day, as often as told by your health care provider.  Contact your health care provider if your blood glucose is above your target for 2 tests in a row.    Have your A1c (hemoglobin A1c) level checked at least two times a year, or as often as told by your health care provider. Your health care provider will set individualized treatment goals for you. Generally, the goal of treatment is to maintain the following blood glucose levels:  Before meals (preprandial): 80-130 mg/dL (4.4-7.2 mmol/L).  After meals (postprandial): below 180 mg/dL (10 mmol/L).  A1c level: less than 7%. What do I need to know about hyperglycemia and hypoglycemia? What is hyperglycemia?  Hyperglycemia, also called high blood glucose, occurs when blood glucose is too high.Make sure you know the early signs of hyperglycemia, such as:  Increased thirst.  Hunger.  Feeling very tired.  Needing to urinate more often than usual.  Blurry vision. What is hypoglycemia?  Hypoglycemia, also called low blood glucose, occurswith a blood glucose level at or below 70 mg/dL (3.9 mmol/L). The risk for hypoglycemia increases during or after exercise, during sleep, during illness, and when skipping meals or not eating for a long time (fasting). It is important to know the symptoms of hypoglycemia and treat it right away. Always have a 15-gram rapid-acting carbohydrate snack with you to  treat low blood glucose. Family members and close friends should also know the symptoms and should understand how to treat hypoglycemia, in case you are not able to treat yourself. What are the symptoms of hypoglycemia?  Hypoglycemia symptoms can include:  Hunger.  Anxiety.  Sweating and feeling clammy.  Confusion.  Dizziness or feeling light-headed.  Sleepiness.  Nausea.  Increased heart rate.  Headache.  Blurry vision.  Seizure.  Nightmares.  Tingling or numbness around the mouth, lips, or tongue.  A change in speech.  Decreased ability to concentrate.  A change in coordination.  Restless sleep.  Tremors or shakes.  Fainting.  Irritability. How do I treat hypoglycemia?    If you are alert and able to swallow safely, follow the 15:15 rule:  Take 15 grams of a rapid-acting carbohydrate. Rapid-acting options include: ? 1 tube of glucose gel. ? 3 glucose pills. ? 6-8 pieces of hard candy. ? 4 oz (120 mL) of fruit juice. ? 4 oz (120 mL) of regular (not diet) soda.  Check your blood glucose 15 minutes after you take the carbohydrate.  If the repeat blood glucose level is still at or below 70 mg/dL (  3.9 mmol/L), take 15 grams of a carbohydrate again.  If your blood glucose level does not increase above 70 mg/dL (3.9 mmol/L) after 3 tries, seek emergency medical care.  After your blood glucose level returns to normal, eat a meal or a snack within 1 hour. How do I treat severe hypoglycemia?  Severe hypoglycemia is when your blood glucose level is at or below 54 mg/dL (3 mmol/L). Severe hypoglycemia is an emergency. Do not wait to see if the symptoms will go away. Get medical help right away. Call your local emergency services (911 in the [place] Do not drive yourself to the hospital. If you have severe hypoglycemia and you cannot eat or drink, you may need an injection of glucagon. A family member or close friend should learn how to check your blood glucose  and how to give you a glucagon injection. Ask your health care provider if you need to have an emergency glucagon injection kit available. Severe hypoglycemia may need to be treated in a hospital. The treatment may include getting glucose through an IV tube. You may also need treatment for the cause of your hypoglycemia. Can having diabetes put me at risk for other conditions? Having diabetes can put you at risk for other long-term (chronic) conditions, such as heart disease and kidney disease. Your health care provider may prescribe medicines to help prevent complications from diabetes. These medicines may include:  Aspirin.  Medicine to lower cholesterol.  Medicine to control blood pressure. What else can I do to manage my diabetes? Take your diabetes medicines as told   If your health care provider prescribed insulin or diabetes medicines, take them every day.  Do not run out of insulin or other diabetes medicines that you take. Plan ahead so you always have these available.  If you use insulin, adjust your dosage based on how physically active you are and what foods you eat. Your health care provider will tell you how to adjust your dosage. Make healthy food choices    The things that you eat and drink affect your blood glucose and your insulin dosage. Making good choices helps to control your diabetes and prevent other health problems. A healthy meal plan includes eating lean proteins, complex carbohydrates, fresh fruits and vegetables, low-fat dairy products, and healthy fats. Make an appointment to see a diet and nutrition specialist (registered dietitian) to help you create an eating plan that is right for you. Make sure that you:  Follow instructions from your health care provider about eating or drinking restrictions.  Drink enough fluid to keep your urine clear or pale yellow.  Eat healthy snacks between nutritious meals.  Track the carbohydrates that you eat. Do this by  reading food labels and learning the standard serving sizes of foods.  Follow your sick day plan whenever you cannot eat or drink as usual. Make this plan in advance with your health care provider. Stay active    Exercise regularly, as told by your health care provider. This may include:  Stretching and doing strength exercises, such as yoga or weightlifting, at least 2 times a week.  Doing at least 150 minutes of moderate-intensity or vigorous-intensity exercise each week. This could be brisk walking, biking, or water aerobics. ? Spread out your activity over at least 3 days of the week. ? Do not go more than 2 days in a row without doing some kind of physical activity. When you start a new exercise or activity, work with your health  care provider to adjust your insulin, medicines, or food intake as needed. Make healthy lifestyle choices   Do not use any tobacco products, such as cigarettes, chewing tobacco, and e-cigarettes. If you need help quitting, ask your health care provider.  If your health care provider says that alcohol is safe for you, limit alcohol intake to no more than 1 drink per day for nonpregnant women and 2 drinks per day for men. One drink equals 12 oz of beer, 5 oz of wine, or 1 oz of hard liquor.  Learn to manage stress. If you need help with this, ask your health care provider. Care for your body     Keep your immunizations up to date. In addition to getting vaccinations as told by your health care provider, it is recommended that you get vaccinated against the following illnesses: ? The flu (influenza). Get a flu shot every year. ? Pneumonia. ? Hepatitis B.  Schedule an eye exam soon after your diagnosis, and then one time every year after that.  Check your skin and feet every day for cuts, bruises, redness, blisters, or sores. Schedule a foot exam with your health care provider once every year.  Brush your teeth and gums two times a day, and floss at  least one time a day. Visit your dentist at least once every 6 months.  Maintain a healthy weight. General instructions   Take over-the-counter and prescription medicines only as told by your health care provider.  Share your diabetes management plan with people in your workplace, school, and household.  Check your urine for ketones when you are ill and as told by your health care provider.  Ask your health care provider: ? Do I need to meet with a diabetes educator? ? Where can I find a support group for people with diabetes?  Carry a medical alert card or wear medical alert jewelry.  Keep all follow-up visits as told by your health care provider. This is important. Where to find more information: For more information about diabetes, visit:  American Diabetes Association (ADA): www.diabetes.org  American Association of Diabetes Educators (AADE): www.diabeteseducator.org/patient-resources This information is not intended to replace advice given to you by your health care provider. Make sure you discuss any questions you have with your health care provider. Document Released: 01/12/2016 Document Revised: 02/26/2016 Document Reviewed: 10/24/2015 Elsevier Interactive Patient Education  2017 [employer].

## 2017-02-17 NOTE — Op Note (Signed)
Name: Phyllis [name]    MRN: [id number]   Date: 02/17/2017    DOB: December 25, 1955   STEREOTACTIC RADIOSURGERY OPERATIVE NOTE  PRE-OPERATIVE DIAGNOSIS:  Left parietal brain metastasis  POST-OPERATIVE DIAGNOSIS:  Same  PROCEDURE:  Stereotactic Radiosurgery  SURGEON:  Consuella Lose, MD  RADIATION ONCOLOGIST: Dr. [name], MD  TECHNIQUE:  The patient underwent a radiation treatment planning session in the radiation oncology simulation suite under the care of the radiation oncology physician and physicist.  I participated closely in the radiation treatment planning afterwards. The patient underwent planning CT which was fused to 3T high resolution MRI with 1 mm axial slices.  These images were fused on the planning system.  Radiation oncology contoured the gross target volume and subsequently expanded this to yield the Planning Target Volume. I actively participated in the planning process.  I helped to define and review the target contours and also the contours of the optic pathway, eyes, brainstem and selected nearby organs at risk.  All the dose constraints for critical structures were reviewed and compared to AAPM Task Group 101.  The prescription dose conformity was reviewed.  I approved the plan electronically.    Accordingly, [name] was brought to the TrueBeam stereotactic radiation treatment linac and placed in the custom immobilization mask.  The patient was aligned according to the IR fiducial markers with BrainLab Exactrac, then orthogonal x-rays were used in ExacTrac with the 6DOF robotic table and the shifts were made to align the patient  This lesion was complex because it was >3.5 cm   [name] received the first of 3 fractions of stereotactic radiosurgery uneventfully to a prescription dose of 27Gy (total dose).  The detailed description of the procedure is recorded in the radiation oncology procedure note.  I was present for the duration of the  procedure.  DISPOSITION:  Following delivery, the patient was transported to nursing in stable condition and monitored for possible acute effects to be discharged to home in stable condition with follow-up in one month.  Consuella Lose, MD [facility]

## 2017-02-17 NOTE — Progress Notes (Signed)
  Radiation Oncology         ([phone]) [phone] ________________________________  Stereotactic Treatment Procedure Note  Name: Phyllis [name] MRN: [id number]  Date: 02/17/2017  DOB: 05-29-1956  SPECIAL TREATMENT PROCEDURE    ICD-9-CM ICD-10-CM   1. Brain metastasis ([place]) 198.3 C79.31     3D TREATMENT PLANNING AND DOSIMETRY:  The patient's radiation plan was reviewed and approved by neurosurgery and radiation oncology prior to treatment.  It showed 3-dimensional radiation distributions overlaid onto the planning CT/MRI image set.  The [facility] for the target structures as well as the organs at risk were reviewed. The documentation of the 3D plan and dosimetry are filed in the radiation oncology EMR.  NARRATIVE:  Phyllis [name] was brought to the TrueBeam stereotactic radiation treatment machine and placed supine on the CT couch. The head frame was applied, and the patient was set up for stereotactic radiosurgery.  Neurosurgery was present for the set-up and delivery  SIMULATION VERIFICATION:  In the couch zero-angle position, the patient underwent Exactrac imaging using the Brainlab system with orthogonal KV images.  These were carefully aligned and repeated to confirm treatment position for each of the isocenters.  The Exactrac snap film verification was repeated at each couch angle.  PROCEDURE: Phyllis [name] received stereotactic radiosurgery to the following targets: Left parietal 37 mm target was treated using 4 Dynamic Conformal Arcs to a prescription dose of 9 Gy which will be repeated for 3 fractions to a total of 27 Gy.  ExacTrac registration was performed for each couch angle.  The 84% isodose line was prescribed.  6 MV X-rays were delivered in the flattening filter free beam mode.  STEREOTACTIC TREATMENT MANAGEMENT:  Following delivery, the patient was transported to nursing in stable condition and monitored for possible acute effects.  Vital signs were recorded BP (!) 154/77 (BP  Location: Right Arm, Patient Position: Sitting, Cuff Size: Normal)   Pulse 70   Temp 97.6 F (36.4 C) (Oral)   Resp 16   LMP 10/05/1999   SpO2 97% . The patient tolerated treatment without significant acute effects, and was discharged to home in stable condition.    PLAN: Follow-up in one month.  ________________________________  Sheral Apley. Tammi Klippel, M.D.

## 2017-02-17 NOTE — Telephone Encounter (Signed)
Scheduled appt per sch message from Rn Dane 5/17 - voicemail full and unable to leave message sent letter out with appt date and time.

## 2017-02-17 NOTE — Progress Notes (Signed)
NURSING PROGRESS NOTE  Phyllis [name] [id number] Discharge Data: 02/17/2017 10:16 AM Attending Provider: Modena Jansky, MD IQN:[id number], Arvid Right, MD     [name] to be D/C'd Home per MD order.  Discussed with the patient and the patient's husband the After Visit Summary and all questions fully answered. Diabetic education given, patient's husband states he is diabetic and is familiar with checking blood sugars. All IV's discontinued with no bleeding noted. Patient's husband confirms that he is taking patient to the [facility] for appointment at 11:30.  Last Vital Signs:  Blood pressure (!) 152/79, pulse 64, temperature 98.3 F (36.8 C), temperature source Oral, resp. rate 18, height _0  (1.651 m), weight 54.9 kg (121 lb 0.5 oz), last menstrual period 10/05/1999, SpO2 98 %.  Discharge Medication List Allergies as of 02/17/2017      Reactions   Amlodipine Swelling   Meperidine Hcl Other (See Comments)   hallucinations   Naproxen Other (See Comments)   Extreme bruising      Medication List    STOP taking these medications   acetaminophen 325 MG tablet Commonly known as:  TYLENOL   Albuterol Sulfate 108 (90 Base) MCG/ACT Aepb Commonly known as:  PROAIR RESPICLICK   atorvastatin 40 MG tablet Commonly known as:  LIPITOR   cloNIDine 0.1 MG tablet Commonly known as:  CATAPRES   famotidine 20 MG tablet Commonly known as:  PEPCID   triamcinolone ointment 0.1 % Commonly known as:  KENALOG     TAKE these medications   blood glucose meter kit and supplies Kit Dispense based on patient and insurance preference. Use up to four times daily as directed. (FOR ICD-9 250.00, 250.01).   carvedilol 6.25 MG tablet Commonly known as:  COREG TAKE 1 TABLET(6.25 MG) BY MOUTH TWICE DAILY WITH A MEAL   dexamethasone 4 MG tablet Commonly known as:  DECADRON Take 1 tablet 3 times daily for 1 week, then reduce to 1 tablet 2 times daily or dose as advised by  your Oncologist. What changed:  additional instructions   DULoxetine 30 MG capsule Commonly known as:  CYMBALTA Take 1 capsule (30 mg total) by mouth daily.   gabapentin 300 MG capsule Commonly known as:  NEURONTIN Take 1 capsule (300 mg total) by mouth 2 (two) times daily. What changed:  See the new instructions.   glipiZIDE 5 MG tablet Commonly known as:  GLUCOTROL Take 0.5 tablets (2.5 mg total) by mouth daily before breakfast.   levETIRAcetam 500 MG tablet Commonly known as:  KEPPRA Take 1 tablet (500 mg total) by mouth 2 (two) times daily.   mupirocin cream 2 % Commonly known as:  BACTROBAN Apply topically 2 (two) times daily. Apply to skin tear on left lower leg   pantoprazole 40 MG tablet Commonly known as:  PROTONIX Take 1 tablet (40 mg total) by mouth daily. Start taking on:  02/18/2017   traMADol 50 MG tablet Commonly known as:  ULTRAM Take 1 tablet (50 mg total) by mouth every 8 (eight) hours as needed for moderate pain or severe pain.        Doristine Devoid, RN

## 2017-02-21 ENCOUNTER — Ambulatory Visit: Admission: RE | Admit: 2017-02-21 | Payer: Medicare Other | Source: Ambulatory Visit | Admitting: Radiation Oncology

## 2017-02-23 ENCOUNTER — Ambulatory Visit
Admission: RE | Admit: 2017-02-23 | Discharge: 2017-02-23 | Disposition: A | Discharge status: Home / Self Care | Payer: Medicare Other | Source: Ambulatory Visit | Attending: Radiation Oncology | Admitting: Radiation Oncology

## 2017-02-23 VITALS — BP 129/71 | HR 84 | Temp 98.4°F | Resp 16

## 2017-02-23 DIAGNOSIS — C7931 Secondary malignant neoplasm of brain: Secondary | ICD-10-CM | POA: Diagnosis not present

## 2017-02-23 DIAGNOSIS — R233 Spontaneous ecchymoses: Secondary | ICD-10-CM | POA: Diagnosis not present

## 2017-02-23 DIAGNOSIS — E860 Dehydration: Secondary | ICD-10-CM | POA: Diagnosis not present

## 2017-02-23 DIAGNOSIS — Z79899 Other long term (current) drug therapy: Secondary | ICD-10-CM | POA: Diagnosis not present

## 2017-02-23 DIAGNOSIS — Z7984 Long term (current) use of oral hypoglycemic drugs: Secondary | ICD-10-CM | POA: Diagnosis not present

## 2017-02-23 DIAGNOSIS — E876 Hypokalemia: Secondary | ICD-10-CM | POA: Diagnosis not present

## 2017-02-23 DIAGNOSIS — Z510 Encounter for antineoplastic radiation therapy: Secondary | ICD-10-CM | POA: Diagnosis not present

## 2017-02-23 DIAGNOSIS — I313 Pericardial effusion (noninflammatory): Secondary | ICD-10-CM | POA: Diagnosis not present

## 2017-02-23 DIAGNOSIS — C3490 Malignant neoplasm of unspecified part of unspecified bronchus or lung: Secondary | ICD-10-CM | POA: Diagnosis not present

## 2017-02-23 DIAGNOSIS — Z888 Allergy status to other drugs, medicaments and biological substances status: Secondary | ICD-10-CM | POA: Diagnosis not present

## 2017-02-23 NOTE — Progress Notes (Signed)
  Radiation Oncology         [phone] ________________________________  Stereotactic Treatment Procedure Note  Name: JENEAL VOGL MRN: [id number]  Date: 02/23/2017  DOB: 05-Mar-1956  SPECIAL TREATMENT PROCEDURE    ICD-9-CM ICD-10-CM   1. Brain metastasis ([place]) 198.3 C79.31      CURRENT FRACTION:   2  PLANNED FRACTIONS:  3    3D TREATMENT PLANNING AND DOSIMETRY:  The patient's radiation plan was reviewed and approved by neurosurgery and radiation oncology prior to treatment.  It showed 3-dimensional radiation distributions overlaid onto the planning CT/MRI image set.  The [facility] for the target structures as well as the organs at risk were reviewed. The documentation of the 3D plan and dosimetry are filed in the radiation oncology EMR.  NARRATIVE:  Randee A Paquette was brought to the TrueBeam stereotactic radiation treatment machine and placed supine on the CT couch. The head frame was applied, and the patient was set up for stereotactic radiosurgery.  Neurosurgery was present for the set-up and delivery  SIMULATION VERIFICATION:  In the couch zero-angle position, the patient underwent Exactrac imaging using the Brainlab system with orthogonal KV images.  These were carefully aligned and repeated to confirm treatment position for each of the isocenters.  The Exactrac snap film verification was repeated at each couch angle.  PROCEDURE: Connie A Dudek received stereotactic radiosurgery to the following targets: Left parietal 37 mm target was treated using 4 Dynamic Conformal Arcs to a prescription dose of 9 Gy which will be repeated for 3 fractions to a total of 27 Gy.  ExacTrac registration was performed for each couch angle.  The 84% isodose line was prescribed.  6 MV X-rays were delivered in the flattening filter free beam mode.  STEREOTACTIC TREATMENT MANAGEMENT:  Following delivery, the patient was transported to nursing in stable condition and monitored for possible acute  effects.  Vital signs were recorded BP 129/71 (BP Location: Right Arm, Patient Position: Sitting, Cuff Size: Normal)   Pulse 84   Temp 98.4 F (36.9 C) (Oral)   Resp 16   LMP 10/05/1999   SpO2 94% . The patient tolerated treatment without significant acute effects, and was discharged to home in stable condition.    PLAN: Complete and then follow-up in one month.  ________________________________  Sheral Apley. Tammi Klippel, M.D.

## 2017-02-23 NOTE — Progress Notes (Signed)
Received patient in the clinic following 2 of 3 srs treatments. Patient is alert and oriented to person and place. Patient denies pain. Vitals stable. Patient denies headache, dizziness, nausea, diplopia or ringing in the ears.  Patient's husband is present at the bedside. Both the patient and her husband deny she is taking any decadron at this time. Mont Dutton provided patient's husband with appointment date and time for third treatment. I provided patient's husband with main line to reach on call provider after hours with needs. Instructed patient and her husband that she should avoid strenuous activity for the next 24 hours. Both verbalized understanding.   BP 129/71 (BP Location: Right Arm, Patient Position: Sitting, Cuff Size: Normal)   Pulse 84   Temp 98.4 F (36.9 C) (Oral)   Resp 16   LMP 10/05/1999   SpO2 94%  Wt Readings from Last 3 Encounters:  02/17/17 121 lb 0.5 oz (54.9 kg)  02/11/17 115 lb 4.8 oz (52.3 kg)  01/19/17 113 lb (51.3 kg)

## 2017-02-25 ENCOUNTER — Ambulatory Visit
Admission: RE | Admit: 2017-02-25 | Discharge: 2017-02-25 | Disposition: A | Discharge status: Home / Self Care | Payer: Medicare Other | Source: Ambulatory Visit | Attending: Radiation Oncology | Admitting: Radiation Oncology

## 2017-02-25 VITALS — BP 169/87 | HR 103 | Temp 97.8°F | Resp 16

## 2017-02-25 DIAGNOSIS — C7931 Secondary malignant neoplasm of brain: Secondary | ICD-10-CM

## 2017-02-25 DIAGNOSIS — I313 Pericardial effusion (noninflammatory): Secondary | ICD-10-CM | POA: Diagnosis not present

## 2017-02-25 DIAGNOSIS — R233 Spontaneous ecchymoses: Secondary | ICD-10-CM | POA: Diagnosis not present

## 2017-02-25 DIAGNOSIS — Z888 Allergy status to other drugs, medicaments and biological substances status: Secondary | ICD-10-CM | POA: Diagnosis not present

## 2017-02-25 DIAGNOSIS — C3490 Malignant neoplasm of unspecified part of unspecified bronchus or lung: Secondary | ICD-10-CM | POA: Diagnosis not present

## 2017-02-25 DIAGNOSIS — E860 Dehydration: Secondary | ICD-10-CM | POA: Diagnosis not present

## 2017-02-25 DIAGNOSIS — E876 Hypokalemia: Secondary | ICD-10-CM | POA: Diagnosis not present

## 2017-02-25 DIAGNOSIS — Z510 Encounter for antineoplastic radiation therapy: Secondary | ICD-10-CM | POA: Diagnosis not present

## 2017-02-25 DIAGNOSIS — Z7984 Long term (current) use of oral hypoglycemic drugs: Secondary | ICD-10-CM | POA: Diagnosis not present

## 2017-02-25 DIAGNOSIS — Z79899 Other long term (current) drug therapy: Secondary | ICD-10-CM | POA: Diagnosis not present

## 2017-02-25 NOTE — Addendum Note (Signed)
Encounter addended by: Heywood Footman, RN on: 02/25/2017  4:15 PM<BR>    Actions taken: Vitals modified, Sign clinical note

## 2017-02-25 NOTE — Progress Notes (Signed)
  Radiation Oncology         [phone] ________________________________  Stereotactic Treatment Procedure Note  Name: Phyllis [name] MRN: [id number]  Date: 02/25/2017  DOB: Feb 02, 1956  SPECIAL TREATMENT PROCEDURE    ICD-9-CM ICD-10-CM   1. Brain metastasis ([place]) 198.3 C79.31    CURRENT FRACTION:   3  PLANNED FRACTIONS:  3  3D TREATMENT PLANNING AND DOSIMETRY:  The patient's radiation plan was reviewed and approved by neurosurgery and radiation oncology prior to treatment.  It showed 3-dimensional radiation distributions overlaid onto the planning CT/MRI image set.  [facility] for the target structures as well as the organs at risk were reviewed. The documentation of the 3D plan and dosimetry are filed in the radiation oncology EMR.  NARRATIVE:  Phyllis [name] was brought to the TrueBeam stereotactic radiation treatment machine and placed supine on the CT couch. The head frame was applied, and the patient was set up for stereotactic radiosurgery.  Neurosurgery was present for the set-up and delivery  SIMULATION VERIFICATION:  In the couch zero-angle position, the patient underwent Exactrac imaging using the Brainlab system with orthogonal KV images.  These were carefully aligned and repeated to confirm treatment position for each of the isocenters.  The Exactrac snap film verification was repeated at each couch angle.  PROCEDURE: Phyllis [name] received stereotactic radiosurgery to the following targets: Left parietal 37 mm target was treated using 4 Dynamic Conformal Arcs to a prescription dose of 9 Gy which will be repeated for 3 fractions to a total of 27 Gy.  ExacTrac registration was performed for each couch angle.  The 84% isodose line was prescribed.  6 MV X-rays were delivered in the flattening filter free beam mode.  STEREOTACTIC TREATMENT MANAGEMENT:  Following delivery, the patient was transported to nursing in stable condition and monitored for possible acute effects.   Vital signs were recorded. The patient tolerated treatment without significant acute effects, and was discharged to home in stable condition.    PLAN: Complete today and follow-up in one month.  ________________________________  Sheral Apley. Tammi Klippel, M.D.  This document serves as a record of services personally performed by [name], MD. It was created on his behalf by Darcus Austin, a trained medical scribe. The creation of this record is based on the scribe's personal observations and the provider's statements to them. This document has been checked and approved by the attending provider.

## 2017-02-25 NOTE — Progress Notes (Signed)
Received patient in the clinic following 3 of 3 srs treatments. Patient is alert and oriented to person and place. Patient denies pain. Vitals stable. Patient denies headache, dizziness, nausea, diplopia or ringing in the ears.  Patient's husband is present at the bedside. Both the patient and her husband deny she is taking any decadron at this time. Mont Dutton provided patient's husband with a one month follow up appointment card. I provided patient's husband with main line to reach on call provider after hours with needs. Instructed patient and her husband that she should avoid strenuous activity for the next 24 hours. Both verbalized understanding.   BP (!) 169/87 (BP Location: Right Arm, Patient Position: Sitting, Cuff Size: Normal)   Pulse (!) 103   Temp 97.8 F (36.6 C) (Oral)   Resp 16   LMP 10/05/1999   SpO2 100%  Wt Readings from Last 3 Encounters:  02/17/17 121 lb 0.5 oz (54.9 kg)  02/11/17 115 lb 4.8 oz (52.3 kg)  01/19/17 113 lb (51.3 kg)

## 2017-02-28 DIAGNOSIS — J449 Chronic obstructive pulmonary disease, unspecified: Secondary | ICD-10-CM | POA: Diagnosis not present

## 2017-03-02 ENCOUNTER — Encounter: Payer: Self-pay | Admitting: Radiation Oncology

## 2017-03-02 ENCOUNTER — Encounter: Payer: Self-pay | Admitting: Internal Medicine

## 2017-03-02 ENCOUNTER — Other Ambulatory Visit (INDEPENDENT_AMBULATORY_CARE_PROVIDER_SITE_OTHER): Payer: Medicare Other

## 2017-03-02 ENCOUNTER — Ambulatory Visit (INDEPENDENT_AMBULATORY_CARE_PROVIDER_SITE_OTHER): Payer: Medicare Other | Admitting: Internal Medicine

## 2017-03-02 VITALS — BP 140/80 | HR 114 | Temp 97.4°F | Resp 20 | Ht 65.0 in | Wt 124.0 lb

## 2017-03-02 DIAGNOSIS — N179 Acute kidney failure, unspecified: Secondary | ICD-10-CM

## 2017-03-02 DIAGNOSIS — C7931 Secondary malignant neoplasm of brain: Secondary | ICD-10-CM

## 2017-03-02 DIAGNOSIS — C3432 Malignant neoplasm of lower lobe, left bronchus or lung: Secondary | ICD-10-CM

## 2017-03-02 DIAGNOSIS — E118 Type 2 diabetes mellitus with unspecified complications: Secondary | ICD-10-CM

## 2017-03-02 DIAGNOSIS — G893 Neoplasm related pain (acute) (chronic): Secondary | ICD-10-CM | POA: Diagnosis not present

## 2017-03-02 LAB — BASIC METABOLIC PANEL
BUN: 11 mg/dL (ref 6–23)
CHLORIDE: 98 meq/L (ref 96–112)
CO2: 31 meq/L (ref 19–32)
Calcium: 9.2 mg/dL (ref 8.4–10.5)
Creatinine, Ser: 0.87 mg/dL (ref 0.40–1.20)
GFR: 84.68 mL/min (ref >60.00)
GLUCOSE: 110 mg/dL — AB (ref 70–99)
POTASSIUM: 4.5 meq/L (ref 3.5–5.1)
SODIUM: 138 meq/L (ref 135–145)

## 2017-03-02 MED ORDER — OXYCODONE HCL 5 MG PO TABS: 5.0000 mg | ORAL_TABLET | Freq: Three times a day (TID) | ORAL | 0 refills | Status: DC | PRN

## 2017-03-02 NOTE — Progress Notes (Signed)
  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  6 MV X-rays were delivered in the flattening filter free beam mode.   Narrative: The patient tolerated radiation treatment relatively well.     Plan: The patient has completed radiation treatment. The patient will return to radiation oncology clinic for routine followup in one month. I advised her to call or return sooner if she has any questions or concerns related to her recovery or treatment. ________________________________  Sheral Apley. Tammi Klippel, M.D.  This document serves as a record of services personally performed by [name], MD. It was created on his behalf by [name], a trained medical scribe. The creation of this record is based on the scribe's personal observations and the provider's statements to them. This document has been checked and approved by the attending provider.

## 2017-03-02 NOTE — Progress Notes (Signed)
 Subjective:  Patient ID: Phyllis [name], female    DOB: 04/30/1956  Age: 61 y.o. MRN: [id number]  CC: Hypertension and Diabetes   HPI Phyllis [name] presents for f/up - She complains of chronic, diffuse pain that is not well controlled with tramadol. She was recently taken to the emergency room by her husband and she tells me that he thought that she was overdosing or try to commit suicide. She confirms to me that that was not the case at all but that it was a scenario orchestrated by her husband because he doesn't trust her. She tells me today that she feels miserable and tortured with chronic pain. She suffers from chronic headaches, chronic abdominal pain and chronic back and joint pain. She denies SI or HI.  Outpatient Medications Prior to Visit  Medication Sig Dispense Refill  . blood glucose meter kit and supplies KIT Dispense based on patient and insurance preference. Use up to four times daily as directed. (FOR ICD-9 250.00, 250.01). 1 each 0  . carvedilol (COREG) 6.25 MG tablet TAKE 1 TABLET(6.25 MG) BY MOUTH TWICE DAILY WITH A MEAL 60 tablet 0  . cloNIDine (CATAPRES) 0.1 MG tablet TK 1 T PO D  11  . DULoxetine (CYMBALTA) 30 MG capsule Take 1 capsule (30 mg total) by mouth daily. 30 capsule 0  . famotidine (PEPCID) 20 MG tablet TK 1 T PO D  0  . gabapentin (NEURONTIN) 300 MG capsule Take 1 capsule (300 mg total) by mouth 2 (two) times daily. 60 capsule 0  . glipiZIDE (GLUCOTROL) 5 MG tablet Take 0.5 tablets (2.5 mg total) by mouth daily before breakfast. 30 tablet 0  . levETIRAcetam (KEPPRA) 500 MG tablet Take 1 tablet (500 mg total) by mouth 2 (two) times daily. 60 tablet 0  . pantoprazole (PROTONIX) 40 MG tablet Take 1 tablet (40 mg total) by mouth daily. 30 tablet 0  . dexamethasone (DECADRON) 4 MG tablet Take 1 tablet 3 times daily for 1 week, then reduce to 1 tablet 2 times daily or dose as advised by your Oncologist. 60 tablet 0  . mupirocin cream (BACTROBAN) 2 % Apply  topically 2 (two) times daily. Apply to skin tear on left lower leg 15 g 0  . traMADol (ULTRAM) 50 MG tablet Take 1 tablet (50 mg total) by mouth every 8 (eight) hours as needed for moderate pain or severe pain. 15 tablet 0   No facility-administered medications prior to visit.     ROS Review of Systems  Constitutional: Positive for fatigue. Negative for appetite change, chills and unexpected weight change.  HENT: Negative.  Negative for trouble swallowing.   Eyes: Negative for visual disturbance.  Respiratory: Positive for shortness of breath. Negative for cough, chest tightness and wheezing.   Cardiovascular: Negative.  Negative for chest pain, palpitations and leg swelling.  Gastrointestinal: Positive for abdominal pain and constipation. Negative for diarrhea, nausea and vomiting.  Endocrine: Negative.  Negative for polydipsia, polyphagia and polyuria.  Genitourinary: Negative.  Negative for difficulty urinating.  Musculoskeletal: Positive for arthralgias and back pain.  Skin: Negative.   Allergic/Immunologic: Negative.   Neurological: Positive for headaches. Negative for dizziness, weakness and light-headedness.  Hematological: Negative for adenopathy. Does not bruise/bleed easily.  Psychiatric/Behavioral: Negative for confusion, dysphoric mood, sleep disturbance and suicidal ideas. The patient is nervous/anxious.     Objective:  BP 140/80 (BP Location: Left Arm, Patient Position: Sitting, Cuff Size: Normal)   Pulse (!) 114   Temp 97.4   F (36.3 C) (Oral)   Resp 20   Ht 5' 5" (1.651 m)   Wt 124 lb (56.2 kg)   LMP 10/05/1999   SpO2 98%   BMI 20.63 kg/m   BP Readings from Last 3 Encounters:  03/02/17 140/80  02/25/17 (!) 169/87  02/23/17 129/71    Wt Readings from Last 3 Encounters:  03/02/17 124 lb (56.2 kg)  02/17/17 121 lb 0.5 oz (54.9 kg)  02/11/17 115 lb 4.8 oz (52.3 kg)    Physical Exam  Constitutional: She is oriented to person, place, and time. No distress.    HENT:  Mouth/Throat: Oropharynx is clear and moist. No oropharyngeal exudate.  Eyes: Conjunctivae are normal. Right eye exhibits no discharge. Left eye exhibits no discharge. No scleral icterus.  Neck: Normal range of motion. Neck supple. No JVD present. No thyromegaly present.  Cardiovascular: Normal rate, regular rhythm and intact distal pulses.   No murmur heard. Pulmonary/Chest: Effort normal and breath sounds normal. No respiratory distress. She has no rales.  Abdominal: Soft. Bowel sounds are normal. She exhibits no distension and no mass. There is no tenderness. There is no rebound and no guarding.  Musculoskeletal: Normal range of motion. She exhibits no edema, tenderness or deformity.  Lymphadenopathy:    She has no cervical adenopathy.  Neurological: She is alert and oriented to person, place, and time.  Skin: Skin is warm and dry. No rash noted. She is not diaphoretic. No erythema. No pallor.  Vitals reviewed.   Lab Results  Component Value Date   WBC 8.1 02/16/2017   HGB 15.2 (H) 02/16/2017   HCT 45.2 02/16/2017   PLT 225 02/16/2017   GLUCOSE 110 (H) 03/02/2017   CHOL 85 01/06/2017   TRIG 54 01/06/2017   HDL 35 (L) 01/06/2017   LDLCALC 39 01/06/2017   ALT 32 02/13/2017   AST 31 02/13/2017   NA 138 03/02/2017   K 4.5 03/02/2017   CL 98 03/02/2017   CREATININE 0.87 03/02/2017   BUN 11 03/02/2017   CO2 31 03/02/2017   TSH 1.139 02/08/2017   INR 1.08 01/19/2017   HGBA1C 7.4 (H) 02/08/2017   MICROALBUR 0.9 12/16/2016    No results found.  Assessment & Plan:   Phyllis [name] was seen today for hypertension and diabetes.  Diagnoses and all orders for this visit:  AKI (acute kidney injury) ([place])- her renal function has improved and is normal now, she agrees to stay well hydrated and to avoid nephrotoxic agents. -     Basic metabolic panel; Future  Brain metastasis ([place])- we'll upgrade her pain management from tramadol to oxycodone. -     Discontinue: oxyCODONE  (OXY IR/ROXICODONE) 5 MG immediate release tablet; Take 1 tablet (5 mg total) by mouth 3 (three) times daily as needed for severe pain. -     Discontinue: oxyCODONE (OXY IR/ROXICODONE) 5 MG immediate release tablet; Take 1 tablet (5 mg total) by mouth 3 (three) times daily as needed for severe pain. -     oxyCODONE (OXY IR/ROXICODONE) 5 MG immediate release tablet; Take 1 tablet (5 mg total) by mouth 3 (three) times daily as needed for severe pain.  Squamous cancer of left lower lobe of lung ([place])- as below -     Discontinue: oxyCODONE (OXY IR/ROXICODONE) 5 MG immediate release tablet; Take 1 tablet (5 mg total) by mouth 3 (three) times daily as needed for severe pain. -     Discontinue: oxyCODONE (OXY IR/ROXICODONE) 5 MG immediate release tablet;  Take 1 tablet (5 mg total) by mouth 3 (three) times daily as needed for severe pain. -     oxyCODONE (OXY IR/ROXICODONE) 5 MG immediate release tablet; Take 1 tablet (5 mg total) by mouth 3 (three) times daily as needed for severe pain.  Cancer-related pain- I think it would be cruel to deny her adequate relief from her pain in the setting of terminal lung cancer with brain metastases. I have therefore written her a prescription for oxycodone starting a low-dose. -     Discontinue: oxyCODONE (OXY IR/ROXICODONE) 5 MG immediate release tablet; Take 1 tablet (5 mg total) by mouth 3 (three) times daily as needed for severe pain. -     Discontinue: oxyCODONE (OXY IR/ROXICODONE) 5 MG immediate release tablet; Take 1 tablet (5 mg total) by mouth 3 (three) times daily as needed for severe pain. -     oxyCODONE (OXY IR/ROXICODONE) 5 MG immediate release tablet; Take 1 tablet (5 mg total) by mouth 3 (three) times daily as needed for severe pain.  Type 2 diabetes mellitus with complication, without long-term current use of insulin ([place])- recent A1c was 7.5%, her blood sugars are adequately well controlled.   I have discontinued Ms. Guard's dexamethasone, traMADol,  and mupirocin cream. I am also having her maintain her DULoxetine, gabapentin, levETIRAcetam, carvedilol, pantoprazole, glipiZIDE, blood glucose meter kit and supplies, cloNIDine, famotidine, and oxyCODONE.  Meds ordered this encounter  Medications  . DISCONTD: oxyCODONE (OXY IR/ROXICODONE) 5 MG immediate release tablet    Sig: Take 1 tablet (5 mg total) by mouth 3 (three) times daily as needed for severe pain.    Dispense:  90 tablet    Refill:  0  . DISCONTD: oxyCODONE (OXY IR/ROXICODONE) 5 MG immediate release tablet    Sig: Take 1 tablet (5 mg total) by mouth 3 (three) times daily as needed for severe pain.    Dispense:  90 tablet    Refill:  0  . oxyCODONE (OXY IR/ROXICODONE) 5 MG immediate release tablet    Sig: Take 1 tablet (5 mg total) by mouth 3 (three) times daily as needed for severe pain.    Dispense:  90 tablet    Refill:  0     Follow-up: Return in about 4 months (around 07/03/2017).  [name], MD

## 2017-03-02 NOTE — Patient Instructions (Signed)
Type 2 Diabetes Mellitus, Diagnosis, Adult Type 2 diabetes (type 2 diabetes mellitus) is a long-term (chronic) disease. In type 2 diabetes, one or both of these problems may be present:  The pancreas does not make enough of a hormone called insulin.  Cells in the body do not respond properly to insulin that the body makes (insulin resistance).  Normally, insulin allows blood sugar (glucose) to enter cells in the body. The cells use glucose for energy. Insulin resistance or lack of insulin causes excess glucose to build up in the blood instead of going into cells. As a result, high blood glucose (hyperglycemia) develops. What increases the risk? The following factors may make you more likely to develop type 2 diabetes:  Having a family member with type 2 diabetes.  Being overweight or obese.  Having an inactive (sedentary) lifestyle.  Having been diagnosed with insulin resistance.  Having a history of prediabetes, gestational diabetes, or polycystic ovarian syndrome (PCOS).  Being of American-Indian, African-American, Hispanic/Latino, or Asian/Pacific Islander descent.  What are the signs or symptoms? In the early stage of this condition, you may not have symptoms. Symptoms develop slowly and may include:  Increased thirst (polydipsia).  Increased hunger(polyphagia).  Increased urination (polyuria).  Increased urination during the night (nocturia).  Unexplained weight loss.  Frequent infections that keep coming back (recurring).  Fatigue.  Weakness.  Vision changes, such as blurry vision.  Cuts or bruises that are slow to heal.  Tingling or numbness in the hands or feet.  Dark patches on the skin (acanthosis nigricans).  How is this diagnosed?  This condition is diagnosed based on your symptoms, your medical history, a physical exam, and your blood glucose level. Your blood glucose may be checked with one or more of the following blood tests:  A fasting blood  glucose (FBG) test. You will not be allowed to eat (you will fast) for at least 8 hours before a blood sample is taken.  A random blood glucose test. This checks blood glucose at any time of day regardless of when you ate.  An A1c (hemoglobin A1c) blood test. This provides information about blood glucose control over the previous 2-3 months.  An oral glucose tolerance test (OGTT). This measures your blood glucose at two times: ? After fasting. This is your baseline blood glucose level. ? Two hours after drinking a beverage that contains glucose.  You may be diagnosed with type 2 diabetes if:  Your FBG level is 126 mg/dL (7.0 mmol/L) or higher.  Your random blood glucose level is 200 mg/dL (11.1 mmol/L) or higher.  Your A1c level is 6.5% or higher.  Your OGGT result is higher than 200 mg/dL (11.1 mmol/L).  These blood tests may be repeated to confirm your diagnosis. How is this treated?  Your treatment may be managed by a specialist called an endocrinologist. Type 2 diabetes may be treated by following instructions from your health care provider about:  Making diet and lifestyle changes. This may include: ? Following an individualized nutrition plan that is developed by a diet and nutrition specialist (registered dietitian). ? Exercising regularly. ? Finding ways to manage stress.  Checking your blood glucose level as often as directed.  Taking diabetes medicines or insulin daily. This helps to keep your blood glucose levels in the healthy range. ? If you use insulin, you may need to adjust the dosage depending on how physically active you are and what foods you eat. Your health care provider will tell you how   dosage.  Taking medicines to help prevent complications from diabetes, such as:  Aspirin.  Medicine to lower cholesterol.  Medicine to control blood pressure. Your health care provider will set individualized treatment goals for you. Your goals will be based on your  age, other medical conditions you have, and how you respond to diabetes treatment. Generally, the goal of treatment is to maintain the following blood glucose levels:  Before meals (preprandial): 80-130 mg/dL (4.4-7.2 mmol/L).  After meals (postprandial): below 180 mg/dL (10 mmol/L).  A1c level: less than 7%. Follow these instructions at home: Questions to [place] Provider  Consider asking the following questions:  Do I need to meet with a diabetes educator?  Where can I find a support group for people with diabetes?  What equipment will I need to manage my diabetes at home?  What diabetes medicines do I need, and when should I take them?  How often do I need to check my blood glucose?  What number can I call if I have questions?  When is my next appointment? General instructions   Take over-the-counter and prescription medicines only as told by your health care provider.  Keep all follow-up visits as told by your health care provider. This is important.  For more information about diabetes, visit:  American Diabetes Association (ADA): www.diabetes.org  American Association of Diabetes Educators (AADE): www.diabeteseducator.org/patient-resources Contact a health care provider if:  Your blood glucose is at or above 240 mg/dL (13.3 mmol/L) for 2 days in a row.  You have been sick or have had a fever for 2 days or longer and you are not getting better.  You have any of the following problems for more than 6 hours:  You cannot eat or drink.  You have nausea and vomiting.  You have diarrhea. Get help right away if:  Your blood glucose is lower than 54 mg/dL (3.0 mmol/L).  You become confused or you have trouble thinking clearly.  You have difficulty breathing.  You have moderate or large ketone levels in your urine. This information is not intended to replace advice given to you by your health care provider. Make sure you discuss any questions you have  with your health care provider. Document Released: 09/20/2005 Document Revised: 02/26/2016 Document Reviewed: 10/24/2015 Elsevier Interactive Patient Education  2017 [employer].

## 2017-03-06 ENCOUNTER — Encounter: Payer: Self-pay | Admitting: Internal Medicine

## 2017-03-08 NOTE — Addendum Note (Signed)
Encounter addended by: Loney Hering on: 03/08/2017  2:12 PM<BR>    Actions taken: Delete clinical note

## 2017-03-10 ENCOUNTER — Emergency Department (HOSPITAL_COMMUNITY)
Admission: EM | Admit: 2017-03-10 | Discharge: 2017-03-11 | Discharge status: Against Medical Advice | Payer: Medicare Other | Attending: Emergency Medicine | Admitting: Emergency Medicine

## 2017-03-10 ENCOUNTER — Emergency Department (HOSPITAL_COMMUNITY): Payer: Medicare Other

## 2017-03-10 ENCOUNTER — Encounter (HOSPITAL_COMMUNITY): Payer: Self-pay | Admitting: Nurse Practitioner

## 2017-03-10 DIAGNOSIS — I2109 ST elevation (STEMI) myocardial infarction involving other coronary artery of anterior wall: Secondary | ICD-10-CM | POA: Insufficient documentation

## 2017-03-10 DIAGNOSIS — E119 Type 2 diabetes mellitus without complications: Secondary | ICD-10-CM | POA: Diagnosis not present

## 2017-03-10 DIAGNOSIS — R7989 Other specified abnormal findings of blood chemistry: Secondary | ICD-10-CM

## 2017-03-10 DIAGNOSIS — I110 Hypertensive heart disease with heart failure: Secondary | ICD-10-CM | POA: Insufficient documentation

## 2017-03-10 DIAGNOSIS — Z79899 Other long term (current) drug therapy: Secondary | ICD-10-CM | POA: Insufficient documentation

## 2017-03-10 DIAGNOSIS — I5032 Chronic diastolic (congestive) heart failure: Secondary | ICD-10-CM | POA: Insufficient documentation

## 2017-03-10 DIAGNOSIS — R0602 Shortness of breath: Secondary | ICD-10-CM | POA: Diagnosis not present

## 2017-03-10 DIAGNOSIS — R778 Other specified abnormalities of plasma proteins: Secondary | ICD-10-CM

## 2017-03-10 DIAGNOSIS — R069 Unspecified abnormalities of breathing: Secondary | ICD-10-CM | POA: Diagnosis not present

## 2017-03-10 DIAGNOSIS — Z87891 Personal history of nicotine dependence: Secondary | ICD-10-CM | POA: Insufficient documentation

## 2017-03-10 DIAGNOSIS — Z85118 Personal history of other malignant neoplasm of bronchus and lung: Secondary | ICD-10-CM | POA: Diagnosis not present

## 2017-03-10 DIAGNOSIS — R9431 Abnormal electrocardiogram [ECG] [EKG]: Secondary | ICD-10-CM | POA: Diagnosis not present

## 2017-03-10 DIAGNOSIS — J449 Chronic obstructive pulmonary disease, unspecified: Secondary | ICD-10-CM | POA: Diagnosis not present

## 2017-03-10 LAB — COMPREHENSIVE METABOLIC PANEL
ALT: 23 U/L (ref 14–54)
AST: 22 U/L (ref 15–41)
Albumin: 2.7 g/dL — ABNORMAL LOW (ref 3.5–5.0)
Alkaline Phosphatase: 79 U/L (ref 38–126)
Anion gap: 7 (ref 5–15)
BILIRUBIN TOTAL: 1.3 mg/dL — AB (ref 0.3–1.2)
BUN: 9 mg/dL (ref 6–20)
CHLORIDE: 97 mmol/L — AB (ref 101–111)
CO2: 30 mmol/L (ref 22–32)
CREATININE: 0.86 mg/dL (ref 0.44–1.00)
Calcium: 8 mg/dL — ABNORMAL LOW (ref 8.9–10.3)
GFR calc non Af Amer: >60 mL/min (ref >60)
Glucose, Bld: 157 mg/dL — ABNORMAL HIGH (ref 65–99)
POTASSIUM: 2.8 mmol/L — AB (ref 3.5–5.1)
Sodium: 134 mmol/L — ABNORMAL LOW (ref 135–145)
TOTAL PROTEIN: 5.1 g/dL — AB (ref 6.5–8.1)

## 2017-03-10 LAB — CBC WITH DIFFERENTIAL/PLATELET
BASOS PCT: 0 %
Basophils Absolute: 0 10*3/uL (ref 0.0–0.1)
Eosinophils Absolute: 0 10*3/uL (ref 0.0–0.7)
Eosinophils Relative: 0 %
HEMATOCRIT: 36.6 % (ref 36.0–46.0)
HEMOGLOBIN: 11.8 g/dL — AB (ref 12.0–15.0)
LYMPHS PCT: 16 %
Lymphs Abs: 0.8 10*3/uL (ref 0.7–4.0)
MCH: 31.1 pg (ref 26.0–34.0)
MCHC: 32.2 g/dL (ref 30.0–36.0)
MCV: 96.6 fL (ref 78.0–100.0)
Monocytes Absolute: 0.8 10*3/uL (ref 0.1–1.0)
Monocytes Relative: 16 %
NEUTROS ABS: 3.5 10*3/uL (ref 1.7–7.7)
NEUTROS PCT: 68 %
Platelets: 151 10*3/uL (ref 150–400)
RBC: 3.79 MIL/uL — AB (ref 3.87–5.11)
RDW: 15.4 % (ref 11.5–15.5)
WBC: 5.1 10*3/uL (ref 4.0–10.5)

## 2017-03-10 LAB — BRAIN NATRIURETIC PEPTIDE: B NATRIURETIC PEPTIDE 5: 848.4 pg/mL — AB (ref 0.0–100.0)

## 2017-03-10 LAB — D-DIMER, QUANTITATIVE: D-Dimer, Quant: 1.83 ug/mL-FEU — ABNORMAL HIGH (ref 0.00–0.50)

## 2017-03-10 LAB — MAGNESIUM: Magnesium: 1.2 mg/dL — ABNORMAL LOW (ref 1.7–2.4)

## 2017-03-10 LAB — I-STAT TROPONIN, ED: Troponin i, poc: 0.15 ng/mL (ref 0.00–0.08)

## 2017-03-10 MED ORDER — POTASSIUM CHLORIDE CRYS ER 20 MEQ PO TBCR
40.0000 meq | EXTENDED_RELEASE_TABLET | Freq: Once | ORAL | Status: AC
  Administered 2017-03-10: 40 meq via ORAL
  Filled 2017-03-10: qty 2

## 2017-03-10 MED ORDER — IOPAMIDOL (ISOVUE-370) INJECTION 76%
100.0000 mL | Freq: Once | INTRAVENOUS | Status: AC | PRN
  Administered 2017-03-10: 88 mL via INTRAVENOUS

## 2017-03-10 MED ORDER — METHYLPREDNISOLONE SODIUM SUCC 125 MG IJ SOLR
80.0000 mg | Freq: Once | INTRAMUSCULAR | Status: AC
  Administered 2017-03-10: 80 mg via INTRAVENOUS
  Filled 2017-03-10: qty 2

## 2017-03-10 MED ORDER — IOPAMIDOL (ISOVUE-370) INJECTION 76%
INTRAVENOUS | Status: AC
  Filled 2017-03-10: qty 100

## 2017-03-10 MED ORDER — OXYCODONE HCL 5 MG PO TABS
5.0000 mg | ORAL_TABLET | Freq: Once | ORAL | Status: AC
  Administered 2017-03-11: 5 mg via ORAL
  Filled 2017-03-10: qty 1

## 2017-03-10 MED ORDER — IPRATROPIUM-ALBUTEROL 0.5-2.5 (3) MG/3ML IN SOLN
3.0000 mL | Freq: Once | RESPIRATORY_TRACT | Status: AC
Start: 2017-03-10 — End: 2017-03-10
  Administered 2017-03-10: 3 mL via RESPIRATORY_TRACT
  Filled 2017-03-10: qty 3

## 2017-03-10 MED ORDER — POTASSIUM CHLORIDE 10 MEQ/100ML IV SOLN
10.0000 meq | Freq: Once | INTRAVENOUS | Status: AC
  Administered 2017-03-10: 10 meq via INTRAVENOUS
  Filled 2017-03-10: qty 100

## 2017-03-10 NOTE — ED Triage Notes (Signed)
Patient has had increasing SOB with a history of lung ca. Patient is on home o2 2L at 92%. Patient is wheezy bilateral with more on right than left. 10 of albuterol,0.5 of Atrovent.

## 2017-03-10 NOTE — ED Notes (Signed)
Patient transported to CT 

## 2017-03-10 NOTE — ED Provider Notes (Signed)
[facility] DEPT Provider Note   CSN: [id number] Arrival date & time: 03/10/17  1711     History   Chief Complaint No chief complaint on file.   HPI Phyllis [name] is a 61 y.o. female.  HPI   Phyllis [name] is a 61 y.o. female, with a history of Metastatic lung cancer, COPD, CHF, asthma, HTN,, presenting to the ED with shortness of breath beginning last night. Also endorses dry cough. Out of inhalers and nebulizers for one month. Uses 2 lpm O2 at home. Last radiation treatment 2 weeks ago.  Denies fever/chills, peripheral edema, CP, N/V/D, or any other complaints.  Oncologist: Julien Nordmann   Past Medical History:  Diagnosis Date  . Anxiety   . Asthma   . CHF (congestive heart failure) ([place])   . Chronic back pain    "in the small of my back and lower back" (01/11/2017)  . Chronic bronchitis ([place])   . Chronic pancreatitis ([place])   . COPD with emphysema ([place])   . DEPRESSION   . Eczema   . Edema 05/20/2010  . GERD 06/06/2009  . Hepatitis C    "took the treatment; I was cured" (01/11/2017)  . High cholesterol   . HYPERTENSION 06/06/2009  . Non-small cell lung cancer (NSCLC) ([place])    NSCLC/squamous cell with bilateral pulmonary nodules treated with stereotactic radiotherapy during the fall of 2017./notes 01/11/2017  . On home oxygen therapy    "3L; 24/7" (01/11/2017)  . OSTEOPOROSIS 06/06/2009  . 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 Pre-diabetes   . Prolonged QT interval 01/07/2017  . Squamous cell carcinoma 06/15/2016  . TOBACCO USE 06/05/2010  . Unspecified hypothyroidism 06/05/2010  . VITAMIN D DEFICIENCY 06/06/2009    Patient Active Problem List   Diagnosis Date Noted  . Cancer-related pain 03/02/2017  . Dehydration with hyponatremia 02/12/2017  . Bipolar disease, chronic ([place]) 02/12/2017  . Chronic diastolic heart failure, NYHA class 1 ([place]) 02/12/2017  . Protein-calorie malnutrition, severe 02/09/2017  . Opiate dependence, continuous ([place])  02/08/2017  . AKI (acute kidney injury) ([place]) 02/08/2017  . Brain metastasis ([place])   . HLD (hyperlipidemia) 01/05/2017  . Chronic diastolic CHF (congestive heart failure) ([place]) 01/05/2017  . Squamous cancer of left lower lobe of lung ([place]) 06/15/2016  . Type II diabetes mellitus with manifestations ([place]) 04/08/2015  . Pulmonary hypertension due to COPD ([place])   . COPD (chronic obstructive pulmonary disease) with chronic bronchitis ([place]) 09/30/2014  . Visit for screening mammogram 09/30/2014  . Routine general medical examination at a health care facility 09/30/2014  . CAD (coronary artery disease), native coronary artery 03/05/2013  . Right lumbar radiculitis 08/06/2010  . Hypothyroidism 06/05/2010  . TOBACCO USE 06/05/2010  . Hep C w/o coma, chronic ([place]) 06/06/2009  . Depression with anxiety 06/06/2009  . GERD 06/06/2009    Past Surgical History:  Procedure Laterality Date  . APPLICATION OF CRANIAL NAVIGATION Left 01/19/2017   Procedure: APPLICATION OF CRANIAL NAVIGATION;  Surgeon: Consuella Lose, MD;  Location: [facility];  Service: Neurosurgery;  Laterality: Left;  . BRAIN SURGERY    . BREAST BIOPSY Right ~ 2000   "needle biopsy"   . CARDIAC CATHETERIZATION    . COLONOSCOPY    . DILATION AND CURETTAGE OF UTERUS    . LAPAROSCOPIC CHOLECYSTECTOMY    . LEFT HEART CATHETERIZATION WITH CORONARY ANGIOGRAM N/A 03/08/2013   Procedure: LEFT HEART CATHETERIZATION WITH CORONARY ANGIOGRAM;  Surgeon: Clent Demark, MD;  Location: [facility] CATH LAB;  Service: Cardiovascular;  Laterality: N/A;  . LUMBAR FUSION    . LUMBAR LAMINECTOMY    . PR DURAL GRAFT REPAIR,SPINE DEFECT Left 01/19/2017   Procedure: LEFT CRANIOTOMY FOR STERIOTACTIC BRAIN BIOPSY;  Surgeon: Consuella Lose, MD;  Location: [facility];  Service: Neurosurgery;  Laterality: Left;  LEFT CRANIOTOMY FOR STERIOTACTIC BRAIN BIOPSY  . TONSILLECTOMY    . TUBAL LIGATION      OB History    No data available       Home Medications    Prior  to Admission medications   Medication Sig Start Date End Date Taking? Authorizing Provider  gabapentin (NEURONTIN) 300 MG capsule Take 1 capsule (300 mg total) by mouth 2 (two) times daily. 02/17/17  Yes Hongalgi, Lenis Dickinson, MD  oxyCODONE (OXY IR/ROXICODONE) 5 MG immediate release tablet Take 1 tablet (5 mg total) by mouth 3 (three) times daily as needed for severe pain. 03/02/17  Yes [name], MD  blood glucose meter kit and supplies KIT Dispense based on patient and insurance preference. Use up to four times daily as directed. (FOR ICD-9 250.00, 250.01). 02/17/17   Hongalgi, Lenis Dickinson, MD  carvedilol (COREG) 6.25 MG tablet TAKE 1 TABLET(6.25 MG) BY MOUTH TWICE DAILY WITH A MEAL Patient not taking: Reported on 03/10/2017 02/17/17   Modena Jansky, MD  DULoxetine (CYMBALTA) 30 MG capsule Take 1 capsule (30 mg total) by mouth daily. Patient not taking: Reported on 03/10/2017 02/17/17   Modena Jansky, MD  glipiZIDE (GLUCOTROL) 5 MG tablet Take 0.5 tablets (2.5 mg total) by mouth daily before breakfast. Patient not taking: Reported on 03/10/2017 02/17/17   Modena Jansky, MD  levETIRAcetam (KEPPRA) 500 MG tablet Take 1 tablet (500 mg total) by mouth 2 (two) times daily. Patient not taking: Reported on 03/10/2017 02/17/17   Modena Jansky, MD  pantoprazole (PROTONIX) 40 MG tablet Take 1 tablet (40 mg total) by mouth daily. Patient not taking: Reported on 03/10/2017 02/18/17   Modena Jansky, MD    Family History Family History  Problem Relation Age of Onset  . COPD Father   . Hypertension Mother   . Kidney disease Mother   . Colon cancer Neg Hx   . Stomach cancer Neg Hx     Social History Social History  Substance Use Topics  . Smoking status: Former Smoker    Packs/day: 0.10    Years: 35.00    Types: Cigarettes    Quit date: 04/03/2015  . Smokeless tobacco: Never Used  . Alcohol use No     Allergies   Amlodipine; Meperidine hcl; and Naproxen   Review of Systems Review of  Systems  Constitutional: Negative for chills and fever.  Respiratory: Positive for cough and shortness of breath.   Cardiovascular: Negative for chest pain and leg swelling.  Gastrointestinal: Negative for abdominal pain, nausea and vomiting.  All other systems reviewed and are negative.    Physical Exam Updated Vital Signs BP 122/76   Pulse 74   Resp 18   Ht _0  (1.651 m)   Wt 56.2 kg (124 lb)   LMP 10/05/1999   SpO2 100%   BMI 20.63 kg/m   Physical Exam  Constitutional: She is oriented to person, place, and time. She appears well-developed and well-nourished.  HENT:  Head: Normocephalic and atraumatic.  Eyes: Conjunctivae are normal.  Neck: Neck supple.  Cardiovascular: Normal rate, regular rhythm, normal heart sounds and intact distal pulses.   Pulmonary/Chest: Tachypnea noted. She  has decreased breath sounds (global). She has wheezes (global).  Increased work of breathing.  Abdominal: Soft. There is no tenderness. There is no guarding.  Musculoskeletal: She exhibits no edema.  Lymphadenopathy:    She has no cervical adenopathy.  Neurological: She is alert and oriented to person, place, and time.  Skin: Skin is warm and dry. She is not diaphoretic.  Psychiatric: She has a normal mood and affect. Her behavior is normal.  Nursing note and vitals reviewed.    ED Treatments / Results  Labs (all labs ordered are listed, but only abnormal results are displayed) Labs Reviewed  COMPREHENSIVE METABOLIC PANEL - Abnormal; Notable for the following:       Result Value   Sodium 134 (*)    Potassium 2.8 (*)    Chloride 97 (*)    Glucose, Bld 157 (*)    Calcium 8.0 (*)    Total Protein 5.1 (*)    Albumin 2.7 (*)    Total Bilirubin 1.3 (*)    All other components within normal limits  CBC WITH DIFFERENTIAL/PLATELET - Abnormal; Notable for the following:    RBC 3.79 (*)    Hemoglobin 11.8 (*)    All other components within normal limits  BRAIN NATRIURETIC PEPTIDE -  Abnormal; Notable for the following:    B Natriuretic Peptide 848.4 (*)    All other components within normal limits  D-DIMER, QUANTITATIVE (NOT AT [facility]) - Abnormal; Notable for the following:    D-Dimer, Quant 1.83 (*)    All other components within normal limits  MAGNESIUM - Abnormal; Notable for the following:    Magnesium 1.2 (*)    All other components within normal limits  I-STAT TROPOININ, ED - Abnormal; Notable for the following:    Troponin i, poc 0.15 (*)    All other components within normal limits  I-STAT TROPOININ, ED - Abnormal; Notable for the following:    Troponin i, poc 0.13 (*)    All other components within normal limits    EKG  EKG Interpretation None       Radiology Dg Chest 2 View  Result Date: 03/10/2017 CLINICAL DATA:  Increasing shortness of breath with history of lung cancer EXAM: CHEST  2 VIEW COMPARISON:  CT chest 05/12/ 2018, radiograph 05/12/ 2018, 01/11/2017 FINDINGS: Non inclusion of the left lung base. Slight increased density at the left base compared to prior. Increased interstitial opacity. Probable small left effusion. Patient's known left lower lobe lung cancer is better seen on CT. Cardiomegaly with atherosclerotic vascular calcification. No pneumothorax. IMPRESSION: 1. Slight increased interstitial opacities in the left greater than right lung bases which may reflect bronchial inflammation. Suspect that there is a small left effusion. 2. Cardiomegaly Electronically Signed   By: Donavan Foil M.D.   On: 03/10/2017 19:50   Ct Angio Chest Pe W Or Wo Contrast  Result Date: 03/10/2017 CLINICAL DATA:  Shortness of breath.  History of lung cancer. EXAM: CT ANGIOGRAPHY CHEST WITH CONTRAST TECHNIQUE: Multidetector CT imaging of the chest was performed using the standard protocol during bolus administration of intravenous contrast. Multiplanar CT image reconstructions and MIPs were obtained to evaluate the vascular anatomy. CONTRAST:  88 mL Isovue 370  COMPARISON:  Chest CT 02/12/2017 FINDINGS: Cardiovascular: Contrast injection is sufficient to demonstrate satisfactory opacification of the pulmonary arteries to the segmental level. There is no pulmonary embolus. The main pulmonary artery is within normal limits for size. There is atherosclerotic calcification within the thoracic aorta but  no dissection or other acute abnormality. There is a normal variant aortic arch branching pattern with the brachiocephalic and left common carotid arteries sharing a common origin. Heart size is normal. There is a pericardial effusion measuring up to 1.6 cm in thickness. There is multifocal coronary artery atherosclerotic calcification. Mediastinum/Nodes: Enlarged node in the asygoesophageal recess is unchanged, measuring approximately 1.8 cm in short axis dimension. There is no mediastinal lymphadenopathy. The course of the thoracic esophagus is normal. No visible thyroid lesion. Lungs/Pleura: There is bilateral upper lobe predominant emphysema. Left lower lobe nodule measures 1.6 x 1.6 cm, unchanged. There is a trace left pleural effusion. Bibasilar atelectasis. Right lower lobe scarring without visible mass or definite nodule. Upper Abdomen: Contrast bolus timing is not optimized for evaluation of the abdominal organs. The hepatic contour is diffusely nodular. Musculoskeletal: No chest wall abnormality. No acute osseous findings. Unchanged superior endplate depression at Q6-5. Review of the MIP images confirms the above findings. IMPRESSION: 1. No pulmonary embolus or acute aortic syndrome. 2. Increased size of pericardial effusion, now measuring up to 1.6 cm in thickness posteriorly. 3. Unchanged size of left lower lobe pulmonary nodule, consistent with known malignancy. 4. Unchanged azygoesophageal recess lymph node that is concerning for metastasis. 5. Additional findings include coronary artery and aortic atherosclerosis, hepatic cirrhosis and pulmonary emphysema.  Electronically Signed   By: Ulyses Jarred M.D.   On: 03/10/2017 22:28    Procedures Procedures (including critical care time)  Medications Ordered in ED Medications  iopamidol (ISOVUE-370) 76 % injection (not administered)  ipratropium-albuterol (DUONEB) 0.5-2.5 (3) MG/3ML nebulizer solution 3 mL (3 mLs Nebulization Given 03/10/17 1958)  methylPREDNISolone sodium succinate (SOLU-MEDROL) 125 mg/2 mL injection 80 mg (80 mg Intravenous Given 03/10/17 1949)  potassium chloride 10 mEq in 100 mL IVPB (0 mEq Intravenous Stopped 03/11/17 0016)  potassium chloride SA (K-DUR,KLOR-CON) CR tablet 40 mEq (40 mEq Oral Given 03/10/17 2252)  iopamidol (ISOVUE-370) 76 % injection 100 mL (88 mLs Intravenous Contrast Given 03/10/17 2146)  oxyCODONE (Oxy IR/ROXICODONE) immediate release tablet 5 mg (5 mg Oral Given 03/11/17 0002)     Initial Impression / Assessment and Plan / ED Course  I have reviewed the triage vital signs and the nursing notes.  Pertinent labs & imaging results that were available during my care of the patient were reviewed by me and considered in my medical decision making (see chart for details).  Clinical Course as of Mar 12 119  Thu Mar 10, 2017  2315 Lung sounds have improved. Global wheezing. Still shows some increased work of breathing especially with ambulation. Denies chest pain or onset of other complaints.   [SJ]  2335 Patient's breathing continues to improve.   [SJ]  2356 Spoke with Dr. Raiford Simmonds, cardiology, who recommends transfer to [facility]. Admission via Hospitalist. Do not give her ASA or heparin.    [SJ]    Clinical Course User Index [SJ] Orlo Brickle, Helane Gunther, PA-C     Results were discussed with the patient through her ED course. She states through her course that she did not want to be admitted "no matter what." She did not want [place] to continue her work up or get consults. Even after she was updated on the cardiologist's recommendation for admission and transfer to [facility], she  adamantly refused to allow transfer, admission, and further treatment. Patient is alert and oriented x4. Patient was told that if she chooses to be discharged, she would be doing so [place]. She was  further told that she has evidence of a cardiac event and that leaving could cause death or significant morbidity. Patient acknowledged each of these things, voiced understanding, repeated them back, and still refused to allow further treatment, transfer, or admission. Dr. Billy Fischer, myself, and Raquel Sarna, RN all made separate attempts to convince the patient. Patient was told that she should at least follow up with her PCP, a cardiologist, or other healthcare provider as soon as possible. She knows she is encouraged to return to the ED should she change her mind.   Multiple attempts were made to try to get in touch with the patient's husband to attempt to enlist his help in convincing the patient to stay, however, we were unable to get in touch with him.    Findings and plan of care discussed with Gareth [place], MD. Dr. Billy Fischer personally evaluated and examined this patient.  Vitals:   03/10/17 1723 03/10/17 1958 03/10/17 2008 03/10/17 2022  BP: 122/76   (!) 118/58  Pulse: 74   88  Resp: 18   17  Temp:   97.9 F (36.6 C)   TempSrc:   Oral   SpO2: 100% 98%  93%  Weight: 56.2 kg (124 lb)     Height: _0  (1.651 m)      Vitals:   03/10/17 2022 03/10/17 2241 03/11/17 0030 03/11/17 0046  BP: (!) 118/58 (!) 127/96 104/77 104/77  Pulse: 88 89 92 86  Resp: 17 (!) 26 (!) 29 (!) 22  Temp:      TempSrc:      SpO2: 93% 96% 98% 100%  Weight:      Height:         Final Clinical Impressions(s) / ED Diagnoses   Final diagnoses:  Shortness of breath  Elevated troponin  Anterior ST segment depression    New Prescriptions New Prescriptions   No medications on file     Layla Maw 03/11/17 0120    Gareth [place], MD 03/11/17 1616

## 2017-03-10 NOTE — ED Notes (Signed)
Bed: JT70 Expected date:  Expected time:  Means of arrival:  Comments: 61 yo SOB

## 2017-03-10 NOTE — ED Notes (Signed)
Patient given crackers and water, ok per provider.

## 2017-03-11 LAB — I-STAT TROPONIN, ED: TROPONIN I, POC: 0.13 ng/mL — AB (ref 0.00–0.08)

## 2017-03-11 NOTE — Discharge Instructions (Signed)
You have indicators of a cardiac event that could be a heart attack. The risks associated with this have been explained to you, including death or further significant health problems. You have acknowledged understanding of this. You have chosen to leave [facility].  You are encouraged to at the very least follow up with your primary care provider or the cardiologist. You are welcome and encouraged to return to the ED at any time should you change your mind.

## 2017-03-11 NOTE — ED Notes (Signed)
Husband notified that pt was being dc'd AMA and that she planned to take a cab home if he didn't come to get her. Husband stated he would be here in about 20 minutes.

## 2017-03-14 ENCOUNTER — Ambulatory Visit: Payer: Self-pay | Admitting: Internal Medicine

## 2017-03-21 ENCOUNTER — Ambulatory Visit: Payer: Medicare Other | Admitting: Internal Medicine

## 2017-03-29 ENCOUNTER — Ambulatory Visit: Payer: Medicare Other | Admitting: Internal Medicine

## 2017-03-31 ENCOUNTER — Ambulatory Visit
Admission: RE | Admit: 2017-03-31 | Discharge: 2017-03-31 | Disposition: A | Discharge status: Home / Self Care | Payer: Medicare Other | Source: Ambulatory Visit | Attending: Urology | Admitting: Urology

## 2017-03-31 DIAGNOSIS — J449 Chronic obstructive pulmonary disease, unspecified: Secondary | ICD-10-CM | POA: Diagnosis not present

## 2017-04-04 ENCOUNTER — Other Ambulatory Visit: Payer: Self-pay | Admitting: Internal Medicine

## 2017-04-05 ENCOUNTER — Ambulatory Visit (INDEPENDENT_AMBULATORY_CARE_PROVIDER_SITE_OTHER): Payer: Medicare Other | Admitting: Internal Medicine

## 2017-04-05 ENCOUNTER — Encounter: Payer: Self-pay | Admitting: Internal Medicine

## 2017-04-05 VITALS — BP 170/90 | HR 77 | Temp 97.9°F | Resp 20 | Ht 65.0 in | Wt 106.0 lb

## 2017-04-05 DIAGNOSIS — E871 Hypo-osmolality and hyponatremia: Secondary | ICD-10-CM | POA: Diagnosis not present

## 2017-04-05 DIAGNOSIS — J449 Chronic obstructive pulmonary disease, unspecified: Secondary | ICD-10-CM

## 2017-04-05 DIAGNOSIS — G893 Neoplasm related pain (acute) (chronic): Secondary | ICD-10-CM

## 2017-04-05 DIAGNOSIS — M5416 Radiculopathy, lumbar region: Secondary | ICD-10-CM

## 2017-04-05 DIAGNOSIS — E118 Type 2 diabetes mellitus with unspecified complications: Secondary | ICD-10-CM

## 2017-04-05 DIAGNOSIS — C7931 Secondary malignant neoplasm of brain: Secondary | ICD-10-CM | POA: Diagnosis not present

## 2017-04-05 DIAGNOSIS — E860 Dehydration: Secondary | ICD-10-CM

## 2017-04-05 MED ORDER — GLYCOPYRROLATE 25 MCG/ML IN SOLN: 1.0000 | Freq: Two times a day (BID) | RESPIRATORY_TRACT | 11 refills | Status: DC

## 2017-04-05 MED ORDER — GLYCOPYRROLATE 25 MCG/ML IN SOLN: 1.0000 | Freq: Two times a day (BID) | RESPIRATORY_TRACT | 0 refills | Status: DC

## 2017-04-05 MED ORDER — OXYCODONE HCL 10 MG PO TABS: 10.0000 mg | ORAL_TABLET | Freq: Three times a day (TID) | ORAL | 0 refills | Status: DC | PRN

## 2017-04-05 NOTE — Patient Instructions (Signed)

## 2017-04-05 NOTE — Telephone Encounter (Signed)
Called pharmacy and they stated that they have received the rx requested via fax.

## 2017-04-05 NOTE — Progress Notes (Signed)
Subjective:  Patient ID: Phyllis [name], female    DOB: 05-Jul-1956  Age: 61 y.o. MRN: [id number]  CC: COPD; Osteoarthritis; Abdominal Pain; and Back Pain   HPI Phyllis [name] presents for complaints about chronic, uncontrolled pain. She complains that the current dose of oxycodone is not adequate. She wants to increase the dose. She also complains of anxiety, panic, and insomnia and wants a refill on Xanax. In addition, she complains of weight loss, loss of appetite, chronic shortness of breath, nonproductive cough, and fatigue. She has tried to use hand-held inhalers in the past and was not successful with the dexterity requirements.  Outpatient Medications Prior to Visit  Medication Sig Dispense Refill  . ALPRAZolam (XANAX) 0.5 MG tablet TAKE 1 TABLET BY MOUTH THREE TIMES DAILY AS NEEDED 90 tablet 3  . blood glucose meter kit and supplies KIT Dispense based on patient and insurance preference. Use up to four times daily as directed. (FOR ICD-9 250.00, 250.01). 1 each 0  . carvedilol (COREG) 6.25 MG tablet TAKE 1 TABLET(6.25 MG) BY MOUTH TWICE DAILY WITH A MEAL (Patient not taking: Reported on 03/10/2017) 60 tablet 0  . DULoxetine (CYMBALTA) 30 MG capsule Take 1 capsule (30 mg total) by mouth daily. (Patient not taking: Reported on 03/10/2017) 30 capsule 0  . gabapentin (NEURONTIN) 300 MG capsule Take 1 capsule (300 mg total) by mouth 2 (two) times daily. 60 capsule 0  . glipiZIDE (GLUCOTROL) 5 MG tablet Take 0.5 tablets (2.5 mg total) by mouth daily before breakfast. (Patient not taking: Reported on 03/10/2017) 30 tablet 0  . levETIRAcetam (KEPPRA) 500 MG tablet Take 1 tablet (500 mg total) by mouth 2 (two) times daily. (Patient not taking: Reported on 03/10/2017) 60 tablet 0  . pantoprazole (PROTONIX) 40 MG tablet Take 1 tablet (40 mg total) by mouth daily. (Patient not taking: Reported on 03/10/2017) 30 tablet 0  . oxyCODONE (OXY IR/ROXICODONE) 5 MG immediate release tablet Take 1 tablet (5 mg  total) by mouth 3 (three) times daily as needed for severe pain. 90 tablet 0   No facility-administered medications prior to visit.     ROS Review of Systems  Constitutional: Positive for activity change, appetite change, fatigue and unexpected weight change. Negative for chills, diaphoresis and fever.  HENT: Negative.  Negative for trouble swallowing.   Eyes: Negative.   Respiratory: Positive for cough, shortness of breath and wheezing. Negative for chest tightness and stridor.   Cardiovascular: Negative for chest pain, palpitations and leg swelling.  Gastrointestinal: Positive for abdominal pain. Negative for constipation, diarrhea, nausea and vomiting.  Endocrine: Negative.   Genitourinary: Negative.  Negative for difficulty urinating.  Musculoskeletal: Positive for arthralgias and back pain. Negative for joint swelling, myalgias and neck pain.  Skin: Negative.   Allergic/Immunologic: Negative.   Neurological: Positive for headaches. Negative for dizziness, weakness and light-headedness.  Hematological: Negative for adenopathy. Does not bruise/bleed easily.  Psychiatric/Behavioral: Positive for sleep disturbance. Negative for behavioral problems, decreased concentration, dysphoric mood, self-injury and suicidal ideas. The patient is nervous/anxious.     Objective:  BP (!) 170/90 (BP Location: Left Arm, Patient Position: Sitting, Cuff Size: Normal)   Pulse 77   Temp 97.9 F (36.6 C) (Oral) Comment: unable to obtain  Resp 20   Ht _0  (1.651 m)   Wt 106 lb (48.1 kg)   LMP 10/05/1999   SpO2 94%   BMI 17.64 kg/m   BP Readings from Last 3 Encounters:  04/05/17 (!) 170/90  03/11/17 124/79  03/02/17 140/80    Wt Readings from Last 3 Encounters:  04/05/17 106 lb (48.1 kg)  03/10/17 124 lb (56.2 kg)  03/02/17 124 lb (56.2 kg)    Physical Exam  Constitutional: She is oriented to person, place, and time.  Non-toxic appearance. She has a sickly appearance. She appears ill.  She appears distressed.  Shaking, cachectic  HENT:  Mouth/Throat: Oropharynx is clear and moist. No oropharyngeal exudate.  Eyes: Conjunctivae are normal. Right eye exhibits no discharge. Left eye exhibits no discharge. No scleral icterus.  Neck: Normal range of motion. Neck supple. No JVD present. No thyromegaly present.  Cardiovascular: Normal rate, regular rhythm and intact distal pulses.  Exam reveals no gallop and no friction rub.   No murmur heard. Pulmonary/Chest: Accessory muscle usage present. No stridor. Tachypnea noted. No respiratory distress. She has no decreased breath sounds. She has no wheezes. She has rhonchi in the right middle field, the right lower field, the left middle field and the left lower field. She has no rales.  Diffuse, bilateral, inspiratory rhonchi but good air movement  Abdominal: Soft. Bowel sounds are normal. She exhibits no distension and no mass. There is no tenderness. There is no rebound and no guarding.  Musculoskeletal: Normal range of motion. She exhibits no edema, tenderness or deformity.  Lymphadenopathy:    She has no cervical adenopathy.  Neurological: She is alert and oriented to person, place, and time.  Skin: Skin is warm and dry. No rash noted. She is not diaphoretic. No erythema. No pallor.  Vitals reviewed.   Lab Results  Component Value Date   WBC 5.1 03/10/2017   HGB 11.8 (L) 03/10/2017   HCT 36.6 03/10/2017   PLT 151 03/10/2017   GLUCOSE 157 (H) 03/10/2017   CHOL 85 01/06/2017   TRIG 54 01/06/2017   HDL 35 (L) 01/06/2017   LDLCALC 39 01/06/2017   ALT 23 03/10/2017   AST 22 03/10/2017   NA 134 (L) 03/10/2017   K 2.8 (L) 03/10/2017   CL 97 (L) 03/10/2017   CREATININE 0.86 03/10/2017   BUN 9 03/10/2017   CO2 30 03/10/2017   TSH 1.139 02/08/2017   INR 1.08 01/19/2017   HGBA1C 7.4 (H) 02/08/2017   MICROALBUR 0.9 12/16/2016    Dg Chest 2 View  Result Date: 03/10/2017 CLINICAL DATA:  Increasing shortness of breath with  history of lung cancer EXAM: CHEST  2 VIEW COMPARISON:  CT chest 05/12/ 2018, radiograph 05/12/ 2018, 01/11/2017 FINDINGS: Non inclusion of the left lung base. Slight increased density at the left base compared to prior. Increased interstitial opacity. Probable small left effusion. Patient's known left lower lobe lung cancer is better seen on CT. Cardiomegaly with atherosclerotic vascular calcification. No pneumothorax. IMPRESSION: 1. Slight increased interstitial opacities in the left greater than right lung bases which may reflect bronchial inflammation. Suspect that there is a small left effusion. 2. Cardiomegaly Electronically Signed   By: Donavan Foil M.D.   On: 03/10/2017 19:50   Ct Angio Chest Pe W Or Wo Contrast  Result Date: 03/10/2017 CLINICAL DATA:  Shortness of breath.  History of lung cancer. EXAM: CT ANGIOGRAPHY CHEST WITH CONTRAST TECHNIQUE: Multidetector CT imaging of the chest was performed using the standard protocol during bolus administration of intravenous contrast. Multiplanar CT image reconstructions and MIPs were obtained to evaluate the vascular anatomy. CONTRAST:  88 mL Isovue 370 COMPARISON:  Chest CT 02/12/2017 FINDINGS: Cardiovascular: Contrast injection is sufficient to demonstrate satisfactory opacification  of the pulmonary arteries to the segmental level. There is no pulmonary embolus. The main pulmonary artery is within normal limits for size. There is atherosclerotic calcification within the thoracic aorta but no dissection or other acute abnormality. There is a normal variant aortic arch branching pattern with the brachiocephalic and left common carotid arteries sharing a common origin. Heart size is normal. There is a pericardial effusion measuring up to 1.6 cm in thickness. There is multifocal coronary artery atherosclerotic calcification. Mediastinum/Nodes: Enlarged node in the asygoesophageal recess is unchanged, measuring approximately 1.8 cm in short axis dimension. There  is no mediastinal lymphadenopathy. The course of the thoracic esophagus is normal. No visible thyroid lesion. Lungs/Pleura: There is bilateral upper lobe predominant emphysema. Left lower lobe nodule measures 1.6 x 1.6 cm, unchanged. There is a trace left pleural effusion. Bibasilar atelectasis. Right lower lobe scarring without visible mass or definite nodule. Upper Abdomen: Contrast bolus timing is not optimized for evaluation of the abdominal organs. The hepatic contour is diffusely nodular. Musculoskeletal: No chest wall abnormality. No acute osseous findings. Unchanged superior endplate depression at Y6-3. Review of the MIP images confirms the above findings. IMPRESSION: 1. No pulmonary embolus or acute aortic syndrome. 2. Increased size of pericardial effusion, now measuring up to 1.6 cm in thickness posteriorly. 3. Unchanged size of left lower lobe pulmonary nodule, consistent with known malignancy. 4. Unchanged azygoesophageal recess lymph node that is concerning for metastasis. 5. Additional findings include coronary artery and aortic atherosclerosis, hepatic cirrhosis and pulmonary emphysema. Electronically Signed   By: Ulyses Jarred M.D.   On: 03/10/2017 22:28    Assessment & Plan:   Phyllis [name] was seen today for copd, osteoarthritis, abdominal pain and back pain.  Diagnoses and all orders for this visit:  Dehydration with hyponatremia- I will recheck her renal function and electrolytes -     Basic metabolic panel; Future  Type 2 diabetes mellitus with complication, without long-term current use of insulin ([place])- recent A1c was 7.4%, her blood sugars are adequately well controlled. -     Basic metabolic panel; Future  Brain metastasis (HCC) -     Oxycodone HCl 10 MG TABS; Take 1 tablet (10 mg total) by mouth 3 (three) times daily as needed.  Right lumbar radiculitis -     Oxycodone HCl 10 MG TABS; Take 1 tablet (10 mg total) by mouth 3 (three) times daily as needed.  Cancer-related  pain -     Oxycodone HCl 10 MG TABS; Take 1 tablet (10 mg total) by mouth 3 (three) times daily as needed.  COPD (chronic obstructive pulmonary disease) with chronic bronchitis ([place])- I think she would benefit from using a LAMA but she has not been successful with hand-held inhalers in the past of asked her to use the only nebulized LAMA. -     Glycopyrrolate (LONHALA MAGNAIR REFILL KIT) 25 MCG/ML SOLN; Inhale 1 Act into the lungs 2 (two) times daily. -     Glycopyrrolate (LONHALA MAGNAIR STARTER KIT) 25 MCG/ML SOLN; Inhale 1 Act into the lungs 2 (two) times daily.   I have discontinued Phyllis [name]'s oxyCODONE. I am also having her start on Oxycodone HCl, Glycopyrrolate, and Glycopyrrolate. Additionally, I am having her maintain her DULoxetine, gabapentin, levETIRAcetam, carvedilol, pantoprazole, glipiZIDE, blood glucose meter kit and supplies, and ALPRAZolam.  Meds ordered this encounter  Medications  . Oxycodone HCl 10 MG TABS    Sig: Take 1 tablet (10 mg total) by mouth 3 (three) times daily as needed.  Dispense:  90 tablet    Refill:  0  . Glycopyrrolate (LONHALA MAGNAIR REFILL KIT) 25 MCG/ML SOLN    Sig: Inhale 1 Act into the lungs 2 (two) times daily.    Dispense:  60 mL    Refill:  11  . Glycopyrrolate (LONHALA MAGNAIR STARTER KIT) 25 MCG/ML SOLN    Sig: Inhale 1 Act into the lungs 2 (two) times daily.    Dispense:  60 mL    Refill:  0     Follow-up: Return in about 4 weeks (around 05/03/2017).  [name], MD

## 2017-04-22 ENCOUNTER — Telehealth: Payer: Self-pay | Admitting: Internal Medicine

## 2017-04-22 ENCOUNTER — Encounter: Payer: Self-pay | Admitting: *Deleted

## 2017-04-22 ENCOUNTER — Ambulatory Visit: Admission: RE | Admit: 2017-04-22 | Payer: Medicare Other | Source: Ambulatory Visit | Admitting: Urology

## 2017-04-22 NOTE — Progress Notes (Signed)
1436 Called about 1430 follow up appointment today no answer and unable to leave a message due to full voice mail box.  Ashlyn Bruning,P.A made aware.

## 2017-04-22 NOTE — Telephone Encounter (Signed)
Pt would like a refill of Oxycodone HCl 10 MG TABS  States she is completely out, would like enough to get her through to her appt on 7/25

## 2017-04-23 ENCOUNTER — Encounter (HOSPITAL_COMMUNITY): Payer: Self-pay

## 2017-04-23 ENCOUNTER — Observation Stay (HOSPITAL_COMMUNITY)
Admission: EM | Admit: 2017-04-23 | Discharge: 2017-04-25 | Disposition: A | Discharge status: Home / Self Care | Payer: Medicare Other | Attending: Internal Medicine | Admitting: Internal Medicine

## 2017-04-23 DIAGNOSIS — I313 Pericardial effusion (noninflammatory): Secondary | ICD-10-CM | POA: Insufficient documentation

## 2017-04-23 DIAGNOSIS — Z9049 Acquired absence of other specified parts of digestive tract: Secondary | ICD-10-CM | POA: Insufficient documentation

## 2017-04-23 DIAGNOSIS — C3432 Malignant neoplasm of lower lobe, left bronchus or lung: Secondary | ICD-10-CM | POA: Diagnosis not present

## 2017-04-23 DIAGNOSIS — G8929 Other chronic pain: Secondary | ICD-10-CM | POA: Diagnosis not present

## 2017-04-23 DIAGNOSIS — R627 Adult failure to thrive: Secondary | ICD-10-CM | POA: Insufficient documentation

## 2017-04-23 DIAGNOSIS — I2510 Atherosclerotic heart disease of native coronary artery without angina pectoris: Secondary | ICD-10-CM | POA: Diagnosis not present

## 2017-04-23 DIAGNOSIS — J449 Chronic obstructive pulmonary disease, unspecified: Secondary | ICD-10-CM | POA: Insufficient documentation

## 2017-04-23 DIAGNOSIS — D696 Thrombocytopenia, unspecified: Secondary | ICD-10-CM

## 2017-04-23 DIAGNOSIS — C7931 Secondary malignant neoplasm of brain: Secondary | ICD-10-CM | POA: Diagnosis not present

## 2017-04-23 DIAGNOSIS — C3490 Malignant neoplasm of unspecified part of unspecified bronchus or lung: Secondary | ICD-10-CM | POA: Diagnosis not present

## 2017-04-23 DIAGNOSIS — Z888 Allergy status to other drugs, medicaments and biological substances status: Secondary | ICD-10-CM | POA: Insufficient documentation

## 2017-04-23 DIAGNOSIS — E43 Unspecified severe protein-calorie malnutrition: Secondary | ICD-10-CM | POA: Insufficient documentation

## 2017-04-23 DIAGNOSIS — I700 Atherosclerosis of aorta: Secondary | ICD-10-CM | POA: Insufficient documentation

## 2017-04-23 DIAGNOSIS — I110 Hypertensive heart disease with heart failure: Secondary | ICD-10-CM | POA: Insufficient documentation

## 2017-04-23 DIAGNOSIS — Z9981 Dependence on supplemental oxygen: Secondary | ICD-10-CM | POA: Diagnosis not present

## 2017-04-23 DIAGNOSIS — Z9119 Patient's noncompliance with other medical treatment and regimen: Secondary | ICD-10-CM | POA: Diagnosis not present

## 2017-04-23 DIAGNOSIS — R404 Transient alteration of awareness: Secondary | ICD-10-CM | POA: Diagnosis not present

## 2017-04-23 DIAGNOSIS — Z8249 Family history of ischemic heart disease and other diseases of the circulatory system: Secondary | ICD-10-CM | POA: Insufficient documentation

## 2017-04-23 DIAGNOSIS — Z825 Family history of asthma and other chronic lower respiratory diseases: Secondary | ICD-10-CM | POA: Insufficient documentation

## 2017-04-23 DIAGNOSIS — R0600 Dyspnea, unspecified: Secondary | ICD-10-CM

## 2017-04-23 DIAGNOSIS — E7800 Pure hypercholesterolemia, unspecified: Secondary | ICD-10-CM | POA: Diagnosis not present

## 2017-04-23 DIAGNOSIS — I5032 Chronic diastolic (congestive) heart failure: Secondary | ICD-10-CM | POA: Diagnosis present

## 2017-04-23 DIAGNOSIS — N179 Acute kidney failure, unspecified: Secondary | ICD-10-CM | POA: Diagnosis not present

## 2017-04-23 DIAGNOSIS — M810 Age-related osteoporosis without current pathological fracture: Secondary | ICD-10-CM | POA: Diagnosis not present

## 2017-04-23 DIAGNOSIS — G629 Polyneuropathy, unspecified: Secondary | ICD-10-CM | POA: Diagnosis not present

## 2017-04-23 DIAGNOSIS — K861 Other chronic pancreatitis: Secondary | ICD-10-CM | POA: Diagnosis not present

## 2017-04-23 DIAGNOSIS — Z841 Family history of disorders of kidney and ureter: Secondary | ICD-10-CM | POA: Insufficient documentation

## 2017-04-23 DIAGNOSIS — Z7984 Long term (current) use of oral hypoglycemic drugs: Secondary | ICD-10-CM | POA: Insufficient documentation

## 2017-04-23 DIAGNOSIS — Z79899 Other long term (current) drug therapy: Secondary | ICD-10-CM | POA: Insufficient documentation

## 2017-04-23 DIAGNOSIS — J4489 Other specified chronic obstructive pulmonary disease: Secondary | ICD-10-CM | POA: Diagnosis present

## 2017-04-23 DIAGNOSIS — E119 Type 2 diabetes mellitus without complications: Secondary | ICD-10-CM | POA: Insufficient documentation

## 2017-04-23 DIAGNOSIS — E876 Hypokalemia: Secondary | ICD-10-CM | POA: Diagnosis not present

## 2017-04-23 DIAGNOSIS — Z87891 Personal history of nicotine dependence: Secondary | ICD-10-CM | POA: Insufficient documentation

## 2017-04-23 DIAGNOSIS — E860 Dehydration: Secondary | ICD-10-CM | POA: Diagnosis not present

## 2017-04-23 DIAGNOSIS — Z981 Arthrodesis status: Secondary | ICD-10-CM | POA: Insufficient documentation

## 2017-04-23 DIAGNOSIS — Z8619 Personal history of other infectious and parasitic diseases: Secondary | ICD-10-CM | POA: Insufficient documentation

## 2017-04-23 DIAGNOSIS — R531 Weakness: Secondary | ICD-10-CM | POA: Diagnosis not present

## 2017-04-23 LAB — COMPREHENSIVE METABOLIC PANEL
ALT: 34 U/L (ref 14–54)
AST: 28 U/L (ref 15–41)
Albumin: 2.8 g/dL — ABNORMAL LOW (ref 3.5–5.0)
Alkaline Phosphatase: 55 U/L (ref 38–126)
Anion gap: 8 (ref 5–15)
BUN: 31 mg/dL — ABNORMAL HIGH (ref 6–20)
CO2: 38 mmol/L — ABNORMAL HIGH (ref 22–32)
Calcium: 7.3 mg/dL — ABNORMAL LOW (ref 8.9–10.3)
Chloride: 94 mmol/L — ABNORMAL LOW (ref 101–111)
Creatinine, Ser: 1.13 mg/dL — ABNORMAL HIGH (ref 0.44–1.00)
GFR calc Af Amer: 59 mL/min — ABNORMAL LOW (ref >60)
GFR calc non Af Amer: 51 mL/min — ABNORMAL LOW (ref >60)
Glucose, Bld: 117 mg/dL — ABNORMAL HIGH (ref 65–99)
Potassium: 2.4 mmol/L — CL (ref 3.5–5.1)
Sodium: 140 mmol/L (ref 135–145)
Total Bilirubin: 1.2 mg/dL (ref 0.3–1.2)
Total Protein: 5.1 g/dL — ABNORMAL LOW (ref 6.5–8.1)

## 2017-04-23 LAB — CBC WITH DIFFERENTIAL/PLATELET
Basophils Absolute: 0 10*3/uL (ref 0.0–0.1)
Basophils Relative: 0 %
Eosinophils Absolute: 0 10*3/uL (ref 0.0–0.7)
Eosinophils Relative: 1 %
HCT: 36 % (ref 36.0–46.0)
Hemoglobin: 11.1 g/dL — ABNORMAL LOW (ref 12.0–15.0)
Lymphocytes Relative: 23 %
Lymphs Abs: 0.9 10*3/uL (ref 0.7–4.0)
MCH: 30.7 pg (ref 26.0–34.0)
MCHC: 30.8 g/dL (ref 30.0–36.0)
MCV: 99.4 fL (ref 78.0–100.0)
Monocytes Absolute: 0.4 10*3/uL (ref 0.1–1.0)
Monocytes Relative: 9 %
Neutro Abs: 2.7 10*3/uL (ref 1.7–7.7)
Neutrophils Relative %: 67 %
Platelets: 102 10*3/uL — ABNORMAL LOW (ref 150–400)
RBC: 3.62 MIL/uL — ABNORMAL LOW (ref 3.87–5.11)
RDW: 16.4 % — ABNORMAL HIGH (ref 11.5–15.5)
WBC: 4 10*3/uL (ref 4.0–10.5)

## 2017-04-23 LAB — MAGNESIUM: Magnesium: 1.5 mg/dL — ABNORMAL LOW (ref 1.7–2.4)

## 2017-04-23 MED ORDER — LORAZEPAM 0.5 MG PO TABS
0.5000 mg | ORAL_TABLET | Freq: Four times a day (QID) | ORAL | Status: AC | PRN
  Administered 2017-04-23: 0.5 mg via ORAL
  Filled 2017-04-23 (×2): qty 1

## 2017-04-23 MED ORDER — SODIUM CHLORIDE 0.9 % IV BOLUS (SEPSIS)
1000.0000 mL | Freq: Once | INTRAVENOUS | Status: AC
  Administered 2017-04-23: 1000 mL via INTRAVENOUS

## 2017-04-23 MED ORDER — POTASSIUM CHLORIDE 10 MEQ/100ML IV SOLN
10.0000 meq | Freq: Once | INTRAVENOUS | Status: AC
  Administered 2017-04-23: 10 meq via INTRAVENOUS
  Filled 2017-04-23: qty 100

## 2017-04-23 MED ORDER — POTASSIUM CHLORIDE CRYS ER 20 MEQ PO TBCR
40.0000 meq | EXTENDED_RELEASE_TABLET | Freq: Once | ORAL | Status: AC
  Administered 2017-04-23: 40 meq via ORAL
  Filled 2017-04-23: qty 2

## 2017-04-23 MED ORDER — HYDROCODONE-ACETAMINOPHEN 5-325 MG PO TABS
2.0000 | ORAL_TABLET | Freq: Once | ORAL | Status: AC
  Administered 2017-04-23: 2 via ORAL
  Filled 2017-04-23: qty 2

## 2017-04-23 NOTE — Progress Notes (Signed)
Pt. Has arrived to the unit via stretcher. She is alert and oriented, answering questions appropriately. The pt. Is requesting ice cream and graham crackers but was told that we would have to check her orders before giving her anything. The pt. Is refusing to remove her pants @ this time and states she still cannot give [place] a urine sample.

## 2017-04-23 NOTE — ED Provider Notes (Signed)
[facility] DEPT Provider Note   CSN: [id number] Arrival date & time: 04/23/17  1449     History   Chief Complaint Chief Complaint  Patient presents with  . Weakness    HPI Caiya A Wyffels is a 61 y.o. female.  HPI   60yF with generalized weakness. Husband says she has been progressively declining, but more so in the past week. Not eating. Increasingly weaker. Cannot dress, toilet or bathe herself. She has fallen several times. He is concerned she is abusing her pain medication. Patient says she is ok and she feels fine when she has her pain meds.   Past Medical History:  Diagnosis Date  . Anxiety   . Asthma   . CHF (congestive heart failure) ([place])   . Chronic back pain    "in the small of my back and lower back" (01/11/2017)  . Chronic bronchitis ([place])   . Chronic pancreatitis ([place])   . COPD with emphysema ([place])   . DEPRESSION   . Eczema   . Edema 05/20/2010  . GERD 06/06/2009  . Hepatitis C    "took the treatment; I was cured" (01/11/2017)  . High cholesterol   . HYPERTENSION 06/06/2009  . Non-small cell lung cancer (NSCLC) ([place])    NSCLC/squamous cell with bilateral pulmonary nodules treated with stereotactic radiotherapy during the fall of 2017./notes 01/11/2017  . On home oxygen therapy    "3L; 24/7" (01/11/2017)  . OSTEOPOROSIS 06/06/2009  . 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 Pre-diabetes   . Prolonged QT interval 01/07/2017  . Squamous cell carcinoma 06/15/2016  . TOBACCO USE 06/05/2010  . Unspecified hypothyroidism 06/05/2010  . VITAMIN D DEFICIENCY 06/06/2009    Patient Active Problem List   Diagnosis Date Noted  . Cancer-related pain 03/02/2017  . Dehydration with hyponatremia 02/12/2017  . Bipolar disease, chronic ([place]) 02/12/2017  . Chronic diastolic heart failure, NYHA class 1 ([place]) 02/12/2017  . Protein-calorie malnutrition, severe 02/09/2017  . Opiate dependence, continuous ([place]) 02/08/2017  . AKI (acute kidney injury) ([place])  02/08/2017  . Brain metastasis ([place])   . HLD (hyperlipidemia) 01/05/2017  . Chronic diastolic CHF (congestive heart failure) ([place]) 01/05/2017  . Squamous cancer of left lower lobe of lung ([place]) 06/15/2016  . Type II diabetes mellitus with manifestations ([place]) 04/08/2015  . Pulmonary hypertension due to COPD ([place])   . COPD (chronic obstructive pulmonary disease) with chronic bronchitis ([place]) 09/30/2014  . Visit for screening mammogram 09/30/2014  . Routine general medical examination at a health care facility 09/30/2014  . CAD (coronary artery disease), native coronary artery 03/05/2013  . Right lumbar radiculitis 08/06/2010  . Hypothyroidism 06/05/2010  . TOBACCO USE 06/05/2010  . Hep C w/o coma, chronic ([place]) 06/06/2009  . Depression with anxiety 06/06/2009  . GERD 06/06/2009    Past Surgical History:  Procedure Laterality Date  . APPLICATION OF CRANIAL NAVIGATION Left 01/19/2017   Procedure: APPLICATION OF CRANIAL NAVIGATION;  Surgeon: Consuella Lose, MD;  Location: [facility];  Service: Neurosurgery;  Laterality: Left;  . BRAIN SURGERY    . BREAST BIOPSY Right ~ 2000   "needle biopsy"   . CARDIAC CATHETERIZATION    . COLONOSCOPY    . DILATION AND CURETTAGE OF UTERUS    . LAPAROSCOPIC CHOLECYSTECTOMY    . LEFT HEART CATHETERIZATION WITH CORONARY ANGIOGRAM N/A 03/08/2013   Procedure: LEFT HEART CATHETERIZATION WITH CORONARY ANGIOGRAM;  Surgeon: Clent Demark, MD;  Location: [facility] CATH LAB;  Service: Cardiovascular;  Laterality: N/A;  .  LUMBAR FUSION    . LUMBAR LAMINECTOMY    . PR DURAL GRAFT REPAIR,SPINE DEFECT Left 01/19/2017   Procedure: LEFT CRANIOTOMY FOR STERIOTACTIC BRAIN BIOPSY;  Surgeon: Consuella Lose, MD;  Location: [facility];  Service: Neurosurgery;  Laterality: Left;  LEFT CRANIOTOMY FOR STERIOTACTIC BRAIN BIOPSY  . TONSILLECTOMY    . TUBAL LIGATION      OB History    No data available       Home Medications    Prior to Admission medications   Medication Sig  Start Date End Date Taking? Authorizing Provider  ALPRAZolam Duanne Moron) 0.5 MG tablet TAKE 1 TABLET BY MOUTH THREE TIMES DAILY AS NEEDED 04/05/17  Yes [name], MD  blood glucose meter kit and supplies KIT Dispense based on patient and insurance preference. Use up to four times daily as directed. (FOR ICD-9 250.00, 250.01). 02/17/17  Yes Hongalgi, Lenis Dickinson, MD  carvedilol (COREG) 6.25 MG tablet TAKE 1 TABLET(6.25 MG) BY MOUTH TWICE DAILY WITH A MEAL 02/17/17  Yes Hongalgi, Lenis Dickinson, MD  gabapentin (NEURONTIN) 300 MG capsule Take 1 capsule (300 mg total) by mouth 2 (two) times daily. 02/17/17  Yes Hongalgi, Lenis Dickinson, MD  Glycopyrrolate [facility] REFILL KIT) 25 MCG/ML SOLN Inhale 1 Act into the lungs 2 (two) times daily. 04/05/17  Yes [name], MD  Oxycodone HCl 10 MG TABS Take 1 tablet (10 mg total) by mouth 3 (three) times daily as needed. 04/05/17  Yes [name], MD  DULoxetine (CYMBALTA) 30 MG capsule Take 1 capsule (30 mg total) by mouth daily. Patient not taking: Reported on 04/23/2017 02/17/17   Modena Jansky, MD  glipiZIDE (GLUCOTROL) 5 MG tablet Take 0.5 tablets (2.5 mg total) by mouth daily before breakfast. Patient not taking: Reported on 04/23/2017 02/17/17   Modena Jansky, MD  Glycopyrrolate [facility] MAGNAIR STARTER KIT) 25 MCG/ML SOLN Inhale 1 Act into the lungs 2 (two) times daily. 04/05/17   [name], MD  levETIRAcetam (KEPPRA) 500 MG tablet Take 1 tablet (500 mg total) by mouth 2 (two) times daily. Patient not taking: Reported on 04/23/2017 02/17/17   Modena Jansky, MD  pantoprazole (PROTONIX) 40 MG tablet Take 1 tablet (40 mg total) by mouth daily. Patient not taking: Reported on 04/23/2017 02/18/17   Modena Jansky, MD    Family History Family History  Problem Relation Age of Onset  . COPD Father   . Hypertension Mother   . Kidney disease Mother   . Colon cancer Neg Hx   . Stomach cancer Neg Hx     Social History Social History  Substance Use  Topics  . Smoking status: Former Smoker    Packs/day: 0.10    Years: 35.00    Types: Cigarettes    Quit date: 04/03/2015  . Smokeless tobacco: Never Used  . Alcohol use No     Allergies   Amlodipine; Meperidine hcl; and Naproxen   Review of Systems Review of Systems  All systems reviewed and negative, other than as noted in HPI.   Physical Exam Updated Vital Signs BP (!) 105/56 (BP Location: Left Arm)   Pulse 64   Temp 97.7 F (36.5 C) (Oral)   Resp 18   Wt 53.1 kg (117 lb)   LMP 10/05/1999   SpO2 100%   BMI 19.47 kg/m   Physical Exam  Constitutional:  Laying in bed. Disheveled appearance. Drowsy.   HENT:  Head: Normocephalic and atraumatic.  Eyes: Pupils  are equal, round, and reactive to light. Conjunctivae are normal. Right eye exhibits no discharge. Left eye exhibits no discharge.  Neck: Neck supple.  Cardiovascular: Regular rhythm and normal heart sounds.  Exam reveals no gallop and no friction rub.   No murmur heard. Pulmonary/Chest: Effort normal and breath sounds normal. No respiratory distress.  Abdominal: Soft. She exhibits no distension. There is no tenderness.  Musculoskeletal: She exhibits no edema or tenderness.  Neurological: She is alert.  Skin: Skin is warm and dry.  Psychiatric: She has a normal mood and affect. Her behavior is normal. Thought content normal.  Nursing note and vitals reviewed.    ED Treatments / Results  Labs (all labs ordered are listed, but only abnormal results are displayed) Labs Reviewed  CBC WITH DIFFERENTIAL/PLATELET - Abnormal; Notable for the following:       Result Value   RBC 3.62 (*)    Hemoglobin 11.1 (*)    RDW 16.4 (*)    Platelets 102 (*)    All other components within normal limits  COMPREHENSIVE METABOLIC PANEL - Abnormal; Notable for the following:    Potassium 2.4 (*)    Chloride 94 (*)    CO2 38 (*)    Glucose, Bld 117 (*)    BUN 31 (*)    Creatinine, Ser 1.13 (*)    Calcium 7.3 (*)    Total  Protein 5.1 (*)    Albumin 2.8 (*)    GFR calc non Af Amer 51 (*)    GFR calc Af Amer 59 (*)    All other components within normal limits  RAPID URINE DRUG SCREEN, HOSP PERFORMED - Abnormal; Notable for the following:    Opiates POSITIVE (*)    Benzodiazepines POSITIVE (*)    All other components within normal limits  MAGNESIUM - Abnormal; Notable for the following:    Magnesium 1.5 (*)    All other components within normal limits  PREALBUMIN - Abnormal; Notable for the following:    Prealbumin 5.3 (*)    All other components within normal limits  BASIC METABOLIC PANEL - Abnormal; Notable for the following:    Potassium 3.3 (*)    Chloride 93 (*)    CO2 37 (*)    Glucose, Bld 162 (*)    BUN 27 (*)    Creatinine, Ser 1.08 (*)    Calcium 7.5 (*)    GFR calc non Af Amer 54 (*)    All other components within normal limits  CBC - Abnormal; Notable for the following:    RBC 3.84 (*)    Hemoglobin 11.8 (*)    MCV 100.3 (*)    RDW 16.7 (*)    Platelets 100 (*)    All other components within normal limits  BASIC METABOLIC PANEL - Abnormal; Notable for the following:    Potassium 2.9 (*)    Chloride 93 (*)    CO2 35 (*)    Glucose, Bld 118 (*)    BUN 27 (*)    Calcium 7.5 (*)    All other components within normal limits  MAGNESIUM - Abnormal; Notable for the following:    Magnesium 1.6 (*)    All other components within normal limits  PHOSPHORUS - Abnormal; Notable for the following:    Phosphorus 2.4 (*)    All other components within normal limits  URINALYSIS, ROUTINE W REFLEX MICROSCOPIC - Abnormal; Notable for the following:    APPearance HAZY (*)  All other components within normal limits  GLUCOSE, CAPILLARY - Abnormal; Notable for the following:    Glucose-Capillary 208 (*)    All other components within normal limits  GLUCOSE, CAPILLARY - Abnormal; Notable for the following:    Glucose-Capillary 112 (*)    All other components within normal limits  BASIC  METABOLIC PANEL - Abnormal; Notable for the following:    Chloride 97 (*)    CO2 33 (*)    Glucose, Bld 141 (*)    BUN 22 (*)    Calcium 7.7 (*)    All other components within normal limits  GLUCOSE, CAPILLARY - Abnormal; Notable for the following:    Glucose-Capillary 119 (*)    All other components within normal limits  GLUCOSE, CAPILLARY - Abnormal; Notable for the following:    Glucose-Capillary 105 (*)    All other components within normal limits  CBC WITH DIFFERENTIAL/PLATELET - Abnormal; Notable for the following:    RDW 16.4 (*)    Platelets 106 (*)    All other components within normal limits  COMPREHENSIVE METABOLIC PANEL - Abnormal; Notable for the following:    Sodium 133 (*)    Chloride 95 (*)    Glucose, Bld 139 (*)    Calcium 7.5 (*)    Total Protein 5.1 (*)    Albumin 2.6 (*)    All other components within normal limits  MAGNESIUM - Abnormal; Notable for the following:    Magnesium 1.4 (*)    All other components within normal limits  GLUCOSE, CAPILLARY - Abnormal; Notable for the following:    Glucose-Capillary 136 (*)    All other components within normal limits  GLUCOSE, CAPILLARY - Abnormal; Notable for the following:    Glucose-Capillary 130 (*)    All other components within normal limits  GLUCOSE, CAPILLARY - Abnormal; Notable for the following:    Glucose-Capillary 137 (*)    All other components within normal limits  TSH  MAGNESIUM  HEMOGLOBIN A1C  HEMOGLOBIN A1C    EKG  EKG Interpretation  Date/Time:  Saturday April 23 2017 16:15:45 EDT Ventricular Rate:  65 PR Interval:    QRS Duration: 71 QT Interval:  445 QTC Calculation: 463 R Axis:   91 Text Interpretation:  Sinus rhythm Left atrial enlargement Low voltage with right axis deviation Repol abnrm suggests ischemia, anterolateral since last tracing no significant change Confirmed by Daleen Bo [phone]) on 04/23/2017 5:27:37 PM       Radiology No results  found.  Procedures Procedures (including critical care time)  Medications Ordered in ED Medications  potassium chloride SA (K-DUR,KLOR-CON) CR tablet 40 mEq (not administered)  potassium chloride 10 mEq in 100 mL IVPB (not administered)  sodium chloride 0.9 % bolus 1,000 mL (1,000 mLs Intravenous New Bag/Given 04/23/17 1630)     Initial Impression / Assessment and Plan / ED Course  I have reviewed the triage vital signs and the nursing notes.  Pertinent labs & imaging results that were available during my care of the patient were reviewed by me and considered in my medical decision making (see chart for details).       Final Clinical Impressions(s) / ED Diagnoses   Final diagnoses:  Dyspnea  Lung cancer [facility])    New Prescriptions New Prescriptions   No medications on file     Virgel Manifold, MD 05/08/17 0207
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
## 2017-04-23 NOTE — ED Notes (Signed)
Unable to provide urine sample at this time

## 2017-04-23 NOTE — ED Triage Notes (Signed)
EMS were told by pt. Husband that pt. "has been real weak and not eating much" recently. Pt. Arrives in no distress with no somatic complaints. She is on home O2 chronically at 3 l.p.m.

## 2017-04-23 NOTE — ED Notes (Signed)
Main lab phlebotomy is also unable to obtain blood sample.

## 2017-04-23 NOTE — ED Notes (Signed)
Spoke with Amy in the lab, lab to add on magnesium

## 2017-04-24 ENCOUNTER — Inpatient Hospital Stay (HOSPITAL_COMMUNITY): Payer: Medicare Other

## 2017-04-24 ENCOUNTER — Encounter (HOSPITAL_COMMUNITY): Payer: Self-pay | Admitting: Radiology

## 2017-04-24 DIAGNOSIS — J439 Emphysema, unspecified: Secondary | ICD-10-CM | POA: Diagnosis not present

## 2017-04-24 DIAGNOSIS — E860 Dehydration: Secondary | ICD-10-CM

## 2017-04-24 LAB — CBC
HCT: 38.5 % (ref 36.0–46.0)
Hemoglobin: 11.8 g/dL — ABNORMAL LOW (ref 12.0–15.0)
MCH: 30.7 pg (ref 26.0–34.0)
MCHC: 30.6 g/dL (ref 30.0–36.0)
MCV: 100.3 fL — AB (ref 78.0–100.0)
PLATELETS: 100 10*3/uL — AB (ref 150–400)
RBC: 3.84 MIL/uL — AB (ref 3.87–5.11)
RDW: 16.7 % — ABNORMAL HIGH (ref 11.5–15.5)
WBC: 5.9 10*3/uL (ref 4.0–10.5)

## 2017-04-24 LAB — BASIC METABOLIC PANEL
ANION GAP: 8 (ref 5–15)
ANION GAP: 8 (ref 5–15)
ANION GAP: 9 (ref 5–15)
BUN: 27 mg/dL — ABNORMAL HIGH (ref 6–20)
BUN: 27 mg/dL — ABNORMAL HIGH (ref 6–20)
BUN: 22 mg/dL — ABNORMAL HIGH (ref 6–20)
CALCIUM: 7.5 mg/dL — AB (ref 8.9–10.3)
CHLORIDE: 97 mmol/L — AB (ref 101–111)
CHLORIDE: 93 mmol/L — AB (ref 101–111)
CO2: 37 mmol/L — ABNORMAL HIGH (ref 22–32)
CO2: 35 mmol/L — ABNORMAL HIGH (ref 22–32)
CO2: 33 mmol/L — AB (ref 22–32)
CREATININE: 1.08 mg/dL — AB (ref 0.44–1.00)
Calcium: 7.5 mg/dL — ABNORMAL LOW (ref 8.9–10.3)
Calcium: 7.7 mg/dL — ABNORMAL LOW (ref 8.9–10.3)
Chloride: 93 mmol/L — ABNORMAL LOW (ref 101–111)
Creatinine, Ser: 0.94 mg/dL (ref 0.44–1.00)
Creatinine, Ser: 0.8 mg/dL (ref 0.44–1.00)
GFR calc Af Amer: >60 mL/min (ref >60)
GFR calc Af Amer: >60 mL/min (ref >60)
GFR calc non Af Amer: 54 mL/min — ABNORMAL LOW (ref >60)
GFR calc non Af Amer: >60 mL/min (ref >60)
GLUCOSE: 141 mg/dL — AB (ref 65–99)
Glucose, Bld: 162 mg/dL — ABNORMAL HIGH (ref 65–99)
Glucose, Bld: 118 mg/dL — ABNORMAL HIGH (ref 65–99)
POTASSIUM: 2.9 mmol/L — AB (ref 3.5–5.1)
POTASSIUM: 4.7 mmol/L (ref 3.5–5.1)
Potassium: 3.3 mmol/L — ABNORMAL LOW (ref 3.5–5.1)
SODIUM: 137 mmol/L (ref 135–145)
SODIUM: 138 mmol/L (ref 135–145)
Sodium: 138 mmol/L (ref 135–145)

## 2017-04-24 LAB — GLUCOSE, CAPILLARY
GLUCOSE-CAPILLARY: 105 mg/dL — AB (ref 65–99)
GLUCOSE-CAPILLARY: 136 mg/dL — AB (ref 65–99)
Glucose-Capillary: 112 mg/dL — ABNORMAL HIGH (ref 65–99)
Glucose-Capillary: 119 mg/dL — ABNORMAL HIGH (ref 65–99)
Glucose-Capillary: 208 mg/dL — ABNORMAL HIGH (ref 65–99)

## 2017-04-24 LAB — URINALYSIS, ROUTINE W REFLEX MICROSCOPIC
BILIRUBIN URINE: NEGATIVE
GLUCOSE, UA: NEGATIVE mg/dL
HGB URINE DIPSTICK: NEGATIVE
KETONES UR: NEGATIVE mg/dL
Leukocytes, UA: NEGATIVE
Nitrite: NEGATIVE
PH: 6 (ref 5.0–8.0)
Protein, ur: NEGATIVE mg/dL
SPECIFIC GRAVITY, URINE: 1.015 (ref 1.005–1.030)

## 2017-04-24 LAB — RAPID URINE DRUG SCREEN, HOSP PERFORMED
Amphetamines: NOT DETECTED
Barbiturates: NOT DETECTED
Benzodiazepines: POSITIVE — AB
Cocaine: NOT DETECTED
Opiates: POSITIVE — AB
Tetrahydrocannabinol: NOT DETECTED

## 2017-04-24 LAB — PHOSPHORUS: PHOSPHORUS: 2.4 mg/dL — AB (ref 2.5–4.6)

## 2017-04-24 LAB — MAGNESIUM
MAGNESIUM: 1.6 mg/dL — AB (ref 1.7–2.4)
Magnesium: 2.2 mg/dL (ref 1.7–2.4)

## 2017-04-24 LAB — PREALBUMIN: Prealbumin: 5.3 mg/dL — ABNORMAL LOW (ref 18–38)

## 2017-04-24 LAB — TSH: TSH: 3.061 u[IU]/mL (ref 0.350–4.500)

## 2017-04-24 MED ORDER — ENOXAPARIN SODIUM 40 MG/0.4ML ~~LOC~~ SOLN
40.0000 mg | SUBCUTANEOUS | Status: DC
  Administered 2017-04-24 – 2017-04-25 (×2): 40 mg via SUBCUTANEOUS
  Filled 2017-04-24 (×2): qty 0.4

## 2017-04-24 MED ORDER — UMECLIDINIUM BROMIDE 62.5 MCG/INH IN AEPB
1.0000 | INHALATION_SPRAY | Freq: Every day | RESPIRATORY_TRACT | Status: DC
  Administered 2017-04-24 – 2017-04-25 (×2): 1 via RESPIRATORY_TRACT
  Filled 2017-04-24: qty 7

## 2017-04-24 MED ORDER — MAGNESIUM SULFATE 2 GM/50ML IV SOLN
2.0000 g | Freq: Once | INTRAVENOUS | Status: AC
  Administered 2017-04-24: 2 g via INTRAVENOUS
  Filled 2017-04-24: qty 50

## 2017-04-24 MED ORDER — POTASSIUM CHLORIDE CRYS ER 20 MEQ PO TBCR
40.0000 meq | EXTENDED_RELEASE_TABLET | ORAL | Status: AC
  Administered 2017-04-24 (×2): 40 meq via ORAL
  Filled 2017-04-24 (×2): qty 2

## 2017-04-24 MED ORDER — SODIUM CHLORIDE 0.9% FLUSH
3.0000 mL | Freq: Two times a day (BID) | INTRAVENOUS | Status: DC
  Administered 2017-04-24: 22:00:00 via INTRAVENOUS

## 2017-04-24 MED ORDER — BISACODYL 10 MG RE SUPP
10.0000 mg | Freq: Every day | RECTAL | Status: DC | PRN
Start: 2017-04-24 — End: 2017-04-25

## 2017-04-24 MED ORDER — SODIUM CHLORIDE 0.45 % IV SOLN
INTRAVENOUS | Status: DC
  Administered 2017-04-24 (×2): via INTRAVENOUS

## 2017-04-24 MED ORDER — IOPAMIDOL (ISOVUE-300) INJECTION 61%
INTRAVENOUS | Status: AC
  Filled 2017-04-24: qty 75

## 2017-04-24 MED ORDER — GLYCOPYRROLATE 25 MCG/ML IN SOLN: 1.0000 | Freq: Two times a day (BID) | RESPIRATORY_TRACT | Status: DC

## 2017-04-24 MED ORDER — INSULIN ASPART 100 UNIT/ML ~~LOC~~ SOLN
0.0000 [IU] | Freq: Three times a day (TID) | SUBCUTANEOUS | Status: DC
  Administered 2017-04-25: 1 [IU] via SUBCUTANEOUS

## 2017-04-24 MED ORDER — INSULIN ASPART 100 UNIT/ML ~~LOC~~ SOLN: 0.0000 [IU] | Freq: Every day | SUBCUTANEOUS | Status: DC

## 2017-04-24 MED ORDER — OXYCODONE HCL 5 MG PO TABS
10.0000 mg | ORAL_TABLET | Freq: Three times a day (TID) | ORAL | Status: DC | PRN
  Administered 2017-04-24 – 2017-04-25 (×3): 10 mg via ORAL
  Filled 2017-04-24 (×3): qty 2

## 2017-04-24 MED ORDER — DULOXETINE HCL 30 MG PO CPEP
30.0000 mg | ORAL_CAPSULE | Freq: Every day | ORAL | Status: DC
  Administered 2017-04-24 – 2017-04-25 (×2): 30 mg via ORAL
  Filled 2017-04-24 (×2): qty 1

## 2017-04-24 MED ORDER — TRAZODONE HCL 50 MG PO TABS: 50.0000 mg | ORAL_TABLET | Freq: Every evening | ORAL | Status: DC | PRN

## 2017-04-24 MED ORDER — LEVETIRACETAM 500 MG PO TABS
500.0000 mg | ORAL_TABLET | Freq: Two times a day (BID) | ORAL | Status: DC
  Administered 2017-04-24 – 2017-04-25 (×3): 500 mg via ORAL
  Filled 2017-04-24 (×3): qty 1

## 2017-04-24 MED ORDER — SODIUM CHLORIDE 0.9 % IV SOLN: 250.0000 mL | INTRAVENOUS | Status: DC | PRN

## 2017-04-24 MED ORDER — ACETAMINOPHEN 650 MG RE SUPP: 650.0000 mg | Freq: Four times a day (QID) | RECTAL | Status: DC | PRN

## 2017-04-24 MED ORDER — ONDANSETRON HCL 4 MG/2ML IJ SOLN: 4.0000 mg | Freq: Four times a day (QID) | INTRAMUSCULAR | Status: DC | PRN

## 2017-04-24 MED ORDER — IOPAMIDOL (ISOVUE-300) INJECTION 61%: 75.0000 mL | Freq: Once | INTRAVENOUS | Status: DC | PRN

## 2017-04-24 MED ORDER — SODIUM CHLORIDE 0.9% FLUSH: 3.0000 mL | INTRAVENOUS | Status: DC | PRN

## 2017-04-24 MED ORDER — ACETAMINOPHEN 325 MG PO TABS: 650.0000 mg | ORAL_TABLET | Freq: Four times a day (QID) | ORAL | Status: DC | PRN

## 2017-04-24 MED ORDER — ONDANSETRON HCL 4 MG PO TABS: 4.0000 mg | ORAL_TABLET | Freq: Four times a day (QID) | ORAL | Status: DC | PRN

## 2017-04-24 NOTE — Care Management Obs Status (Signed)
[place] NOTIFICATION   Patient Details  Name: SOPHIAGRACE BENBROOK MRN: [id number] Date of Birth: 24-Jan-1956   Medicare Observation Status Notification Given:  Yes    Erenest Rasher, RN 04/24/2017, 5:16 PM

## 2017-04-24 NOTE — Progress Notes (Signed)
Notified physician that patient IV infiltrated when receiving contrast for CT.  CT, Phyllis [name], removed the IV.  Patient IV access is very poor per IV team and probably would not withstand contrast.  Reported this to physician.  Physician orders CT without contrast.

## 2017-04-24 NOTE — Care Management CC44 (Signed)
Condition Code 44 Documentation Completed  Patient Details  Name: Phyllis [name] MRN: [id number] Date of Birth: October 26, 1955   Condition Code 44 given:  Yes Patient signature on Condition Code 44 notice:  Yes Documentation of 2 MD's agreement:  Yes Code 44 added to claim:  Yes    Erenest Rasher, RN 04/24/2017, 5:17 PM

## 2017-04-24 NOTE — Progress Notes (Signed)
Patient ID: Phyllis [name], female   DOB: 31-Jul-1956, 61 y.o.   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 She was also found to be hypokalemic and dehydrated.   Assessment & Plan:   Principal Problem:   Dehydration Active Problems:   Acute renal failure (ARF) (HCC)   CAD (coronary artery disease), native coronary artery   Hypokalemia   COPD (chronic obstructive pulmonary disease) with chronic bronchitis (HCC)   Squamous cancer of left lower lobe of lung (HCC)   Chronic diastolic CHF (congestive heart failure) (HCC)   AKI (acute kidney injury) (HCC)   Thrombocytopenia (HCC)   Dehydration: Due to poor oral intake Decrease normal saline to 75 mL an hour and discontinue if tolerating diet Watch for fluid overload with history of CHF  Hypokalemia: Replace. Repeat a.m. Labs  Hypomagnesemia - Replace and repeat a.m. labs  Mildly increased creatinine - Probably from dehydration. Acute kidney injury has been ruled out - Repeat a.m. labs. Continue IV fluids  DM2: Hold oral hypoglycemics SSI with insulin prn  Thrombocytopenia: Etiology unclear: Probably from metastatic cancer -Repeat a.m. Labs  Severe Protein Calorie Malnutrition: Hx of metastatic lung cancer/ chronic disease Nutrition consult  Failure to Thrive: multiple complicated medical issues Recurrent admissions/ED visit/office visits PT/OT SW/CM - possible placement Palliative medicine- goals of care  Chronic Pain: High risk for narcotic abuse Palliative care consult. Outpatient pain  clinic referral  Chronic diastolic congestive heart failure - Currently stable. Strict input output and daily weights  History of metastatic lung cancer with metastasis to the brain - CT scan of the chest to follow-up on the lung cancer as patient is noncompliant to treatment and MRI of the brain with and without contrast. - We will request evaluation by Dr. Julien Nordmann in a.m.   DVT prophylaxis: Lovenox Code Status:  Full Family Communication: None at bedside Disposition Plan: Depends on clinical outcome  Consultants: None  Procedures: None  Antimicrobials: None    Subjective: Patient seen and examined at bedside. She feels weak but no current nausea or vomiting or chest pain.  Objective: Vitals:   04/23/17 2310 04/23/17 2326 04/24/17 0459 04/24/17 0924  BP:  106/74 103/71   Pulse:  71 64 70  Resp:  _0 Temp:  97.8 F (36.6 C) 98.2 F (36.8 C)   TempSrc:  Oral Axillary   SpO2:  98% 100% 95%  Weight: 54.4 kg (120 lb)     Height: _1  (1.651 m)       Intake/Output Summary (Last 24 hours) at 04/24/17 0931 Last data filed at 04/23/17 2119  Gross per 24 hour  Intake             1100 ml  Output                0 ml  Net             1100 ml   Filed Weights   04/23/17 1934 04/23/17 2310  Weight: 53.1 kg (117 lb) 54.4 kg (120 lb)    Examination:  General exam: Appears calm and comfortable  Respiratory system: Bilateral decreased breath sound at basesWith scattered crackles Cardiovascular system: S1 & S2 heard, rate controlled  Gastrointestinal system: Abdomen is nondistended, soft and nontender. Normal bowel sounds heard. Central nervous system: Alert but confused to time. No focal neurological deficits. Moving extremities Extremities: No cyanosis, clubbing, edema  Skin: No rashes, lesions or ulcers Lymph: No cervical lymphadenopathy    Data Reviewed: I have personally reviewed following labs and imaging studies  CBC:  Recent Labs Lab 04/23/17 1912  04/24/17 0605  WBC 4.0 5.9  NEUTROABS 2.7  --   HGB 11.1* 11.8*  HCT 36.0 38.5  MCV 99.4 100.3*  PLT 102* 761*   Basic Metabolic Panel:  Recent Labs Lab 04/23/17 1912 04/24/17 0029 04/24/17 0605  NA 140 138 137  K 2.4* 3.3* 2.9*  CL 94* 93* 93*  CO2 38* 37* 35*  GLUCOSE 117* 162* 118*  BUN 31* 27* 27*  CREATININE 1.13* 1.08* 0.94  CALCIUM 7.3* 7.5* 7.5*  MG 1.5* 1.6*  --   PHOS  --  2.4*  --    GFR: Estimated Creatinine Clearance: 53.3 mL/min (by C-G formula based on SCr of 0.94 mg/dL). Liver Function Tests:  Recent Labs Lab 04/23/17 1912  AST 28  ALT 34  ALKPHOS 55  BILITOT 1.2  PROT 5.1*  ALBUMIN 2.8*   No results for input(s): LIPASE, AMYLASE in the last 168 hours. No results for input(s): AMMONIA in the last 168 hours. Coagulation Profile: No results for input(s): INR, PROTIME in the last 168 hours. Cardiac Enzymes: No results for input(s): CKTOTAL, CKMB, CKMBINDEX, TROPONINI in the last 168 hours. BNP (last 3 results) No results for input(s): PROBNP in the last 8760 hours. HbA1C: No results for input(s): HGBA1C in the last 72 hours. CBG:  Recent Labs Lab 04/24/17 0040 04/24/17 0740  GLUCAP 208* 112*   Lipid Profile: No results for input(s): CHOL, HDL, LDLCALC, TRIG, CHOLHDL, LDLDIRECT in the last 72 hours. Thyroid Function Tests:  Recent Labs  04/24/17 0029  TSH 3.061   Anemia Panel: No results for input(s): VITAMINB12, FOLATE, FERRITIN, TIBC, IRON, RETICCTPCT in the last 72 hours. Sepsis Labs: No results for input(s): PROCALCITON, LATICACIDVEN in the last 168 hours.  No results found for this or any previous visit (from the past 240 hour(s)).       Radiology Studies: No results found.      Scheduled Meds: . DULoxetine  30 mg Oral Daily  . enoxaparin (LOVENOX) injection  40 mg Subcutaneous Q24H  . insulin aspart  0-5 Units Subcutaneous QHS  . insulin aspart  0-9 Units Subcutaneous TID WC  . levETIRAcetam  500 mg Oral BID    . potassium chloride  80 mEq Oral Once  . sodium chloride flush  3 mL Intravenous Q12H  . umeclidinium bromide  1 puff Inhalation Daily   Continuous Infusions: . sodium chloride 100 mL/hr at 04/24/17 0019  . sodium chloride    . magnesium sulfate 1 - 4 g bolus IVPB       LOS: 1 day        Aline August, MD [facility] Pager [phone]  If 7PM-7AM, please contact night-coverage [email or web address] Password [facility] 04/24/2017, 9:31 AM

## 2017-04-24 NOTE — Care Management Note (Signed)
Case Management Note  Patient Details  Name: Phyllis [name] MRN: [id number] Date of Birth: 07/26/1956  Subjective/Objective:     COPD, CHF                Action/Plan: Discharge Planning: NCM spoke to pt and states she lives at home with husband. Pt meets guidelines for High Risk Initiative program to have [facility] RN follow more closely once dc. Will need weekday NCM to follow up with agency that is currently assigned this week to arrange [facility]. Pt states she has oxygen and neb machine at home. Pt requesting a RW with seat. Will have weekday NCM order with [facility] on Monday. Will need HH RN, PT, aide and SW orders with [facility]. Left message for attending.   PCP [name] MD  Expected Discharge Date:                  Expected Discharge Plan:  [place]  In-House Referral:  NA  Discharge planning Services  CM Consult  Post Acute Care Choice:  Home Health Choice offered to:  Patient  DME Arranged:    DME Agency:     HH Arranged:    [place] Agency:     Status of Service:  In process, will continue to follow  If discussed at Long Length of Stay Meetings, dates discussed:    Additional Comments:  Erenest Rasher, RN 04/24/2017, 5:13 PM

## 2017-04-25 ENCOUNTER — Observation Stay (HOSPITAL_COMMUNITY): Payer: Medicare Other

## 2017-04-25 ENCOUNTER — Telehealth: Payer: Self-pay | Admitting: Medical Oncology

## 2017-04-25 DIAGNOSIS — S0990XA Unspecified injury of head, initial encounter: Secondary | ICD-10-CM | POA: Diagnosis not present

## 2017-04-25 DIAGNOSIS — E860 Dehydration: Secondary | ICD-10-CM | POA: Diagnosis not present

## 2017-04-25 LAB — CBC WITH DIFFERENTIAL/PLATELET
BASOS ABS: 0 10*3/uL (ref 0.0–0.1)
BASOS PCT: 0 %
EOS ABS: 0 10*3/uL (ref 0.0–0.7)
EOS PCT: 1 %
HCT: 38.8 % (ref 36.0–46.0)
Hemoglobin: 12 g/dL (ref 12.0–15.0)
LYMPHS PCT: 14 %
Lymphs Abs: 0.9 10*3/uL (ref 0.7–4.0)
MCH: 30.6 pg (ref 26.0–34.0)
MCHC: 30.9 g/dL (ref 30.0–36.0)
MCV: 99 fL (ref 78.0–100.0)
MONO ABS: 0.4 10*3/uL (ref 0.1–1.0)
Monocytes Relative: 7 %
Neutro Abs: 5.2 10*3/uL (ref 1.7–7.7)
Neutrophils Relative %: 79 %
PLATELETS: 106 10*3/uL — AB (ref 150–400)
RBC: 3.92 MIL/uL (ref 3.87–5.11)
RDW: 16.4 % — AB (ref 11.5–15.5)
WBC: 6.6 10*3/uL (ref 4.0–10.5)

## 2017-04-25 LAB — COMPREHENSIVE METABOLIC PANEL
ALBUMIN: 2.6 g/dL — AB (ref 3.5–5.0)
ALT: 25 U/L (ref 14–54)
AST: 17 U/L (ref 15–41)
Alkaline Phosphatase: 52 U/L (ref 38–126)
Anion gap: 6 (ref 5–15)
BUN: 15 mg/dL (ref 6–20)
CHLORIDE: 95 mmol/L — AB (ref 101–111)
CO2: 32 mmol/L (ref 22–32)
CREATININE: 0.61 mg/dL (ref 0.44–1.00)
Calcium: 7.5 mg/dL — ABNORMAL LOW (ref 8.9–10.3)
GFR calc Af Amer: >60 mL/min (ref >60)
GFR calc non Af Amer: >60 mL/min (ref >60)
Glucose, Bld: 139 mg/dL — ABNORMAL HIGH (ref 65–99)
POTASSIUM: 3.5 mmol/L (ref 3.5–5.1)
SODIUM: 133 mmol/L — AB (ref 135–145)
Total Bilirubin: 0.9 mg/dL (ref 0.3–1.2)
Total Protein: 5.1 g/dL — ABNORMAL LOW (ref 6.5–8.1)

## 2017-04-25 LAB — MAGNESIUM: Magnesium: 1.4 mg/dL — ABNORMAL LOW (ref 1.7–2.4)

## 2017-04-25 LAB — GLUCOSE, CAPILLARY
GLUCOSE-CAPILLARY: 130 mg/dL — AB (ref 65–99)
GLUCOSE-CAPILLARY: 137 mg/dL — AB (ref 65–99)

## 2017-04-25 MED ORDER — MAGNESIUM OXIDE 400 MG PO TABS: 400.0000 mg | ORAL_TABLET | Freq: Three times a day (TID) | ORAL | 0 refills | Status: DC

## 2017-04-25 MED ORDER — GADOBENATE DIMEGLUMINE 529 MG/ML IV SOLN
10.0000 mL | Freq: Once | INTRAVENOUS | Status: AC | PRN
  Administered 2017-04-25: 10 mL via INTRAVENOUS

## 2017-04-25 MED ORDER — MAGNESIUM SULFATE 2 GM/50ML IV SOLN
2.0000 g | Freq: Once | INTRAVENOUS | Status: AC
  Administered 2017-04-25: 2 g via INTRAVENOUS
  Filled 2017-04-25: qty 50

## 2017-04-25 NOTE — Clinical Social Work Note (Signed)
Clinical Social Work Assessment  Patient Details  Name: Phyllis [name] MRN: [id number] Date of Birth: 11-11-1955  Date of referral:  04/25/17               Reason for consult:                   Permission sought to share information with:    Permission granted to share information::     Name::        Agency::     Relationship::   Spouse  Contact Information:     Housing/Transportation Living arrangements for the past 2 months:  Single Family Home Source of Information:  Patient Patient Interpreter Needed:  None Criminal Activity/Legal Involvement Pertinent to Current Situation/Hospitalization:  No - Comment as needed Significant Relationships:  Spouse Lives with:  Spouse Do you feel safe going back to the place where you live?    Need for family participation in patient care:  No (Coment)  Care giving concerns:  Overmedicating with Oxycodone    Facilities manager / plan:  CSW met with patient at bedside, explain role and reason for visit. Patient reports she lives at home with her spouse who helps to care for her. Patient reports she has been taking oxycodone for three years for pain in her legs and back. Patient reports, "I am prescribed to take three daily. I might take one extra when the pain is worse." Patient states, "I don't have a problem with taking my medications but my husband thinks I do." Patient reports, " I am going to pick my medication up on the 25th and keep using it because I need it."  Patient expressed no other concerns. Patient denies using any other substances.   Employment status:  Retired Nurse, adult PT Recommendations:  Home with [place] / Referral to community resources:  [place]  Patient/Family's Response to care: Patient spouse concern about pt. Use of oxycodone.  RNCM addressed patient concerns per note:Patient husband in room at 1043/states that patient is overmedicating herself  with oxycodone-suggested that he obtain a lock box and keep the key on himself and give her pain medication only on the scheduled times/understand that if condition deteriorates at home to call the pcp and make him aware for future instruction as to care.  Patient/Family's Understanding of and Emotional Response to Diagnosis, Current Treatment, and Prognosis: Patient family is concern about her medication management, and ability to care for self. Patient has required increased dependency on the husband to help with ADL's. Patient going home with Home Health services.  Emotional Assessment Appearance:  Developmentally appropriate Attitude/Demeanor/Rapport:    Affect (typically observed):  Calm Orientation:  Oriented to Self, Oriented to Place, Oriented to  Time, Oriented to Situation Alcohol / Substance use:  Not Applicable Psych involvement (Current and /or in the community):  No (Comment)  Discharge Needs  Concerns to be addressed:  Discharge Planning Concerns Readmission within the last 30 days:  Yes Current discharge risk:  None Barriers to Discharge:  Continued Medical Work up   [employer], LCSW 04/25/2017, 11:12 AM

## 2017-04-25 NOTE — Care Management Note (Addendum)
Case Management Note  Patient Details  Name: Phyllis [name] MRN: [id number] Date of Birth: 04-21-1956  Subjective/Objective:                  copd  Action/Plan: Date:  April 25 2017  Chart reviewed for concurrent status and case management needs.  Will continue to follow patient progress.  Discharge Planning: following for needs /rolling walker with seat and resumption of hhc sent to advanced h[facility]. Patient husband in room at 1043/states that patient is overmedicating herself with oxycodone-suggested that he obtain a lock box and keep the key on himself and give her pain medication only on the scheduled times/understand that if condition deteriorates at home to call the pcp and make him aware for future instruction as to care. Expected discharge date: [id number]  Velva Harman, BSN, [place], [place]   Expected Discharge Date:  04/25/17               Expected Discharge Plan:  [place]  In-House Referral:  NA  Discharge planning Services  CM Consult  Post Acute Care Choice:  Home Health, Resumption of Svcs/PTA Provider Choice offered to:  Patient  DME Arranged:    DME Agency:     HH Arranged:  RN, PT, OT [facility] Agency:     Status of Service:  Completed, signed off  If discussed at [place] of Stay Meetings, dates discussed:    Additional Comments:  Leeroy Cha, RN 04/25/2017, 9:52 AM

## 2017-04-25 NOTE — Discharge Summary (Signed)
Physician Discharge Summary  Phyllis [name] NID:[id number] DOB: 23-May-1956 DOA: 04/23/2017  PCP: [name], MD  Admit date: 04/23/2017 Discharge date: 04/25/2017  Admitted From: Home Disposition:  Home  Recommendations for Outpatient Follow-up:  1. Follow up with PCP in 1 week 2. Please obtain BMP/CBC/Magneseium in one week 3. Follow up with [name]/Oncology at earliest 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 She was also found to be hypokalemic and dehydrated.  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 Status post IV fluids - Tolerating oral food.  Hypokalemia: Improved. Outpatient follow-up  Hypomagnesemia - Discharged home with oral magnesium oxide. Outpatient follow-up  Mildly increased creatinine - Probably from dehydration. Acute kidney injury has been ruled out - Creatinine improved. Outpatient follow-up  DM2: Hold oral hypoglycemics Blood sugars controlled Outpatient follow-up with primary care provider  Thrombocytopenia: Etiology unclear: Probably from metastatic cancer -Outpatient  follow-up with oncology  Severe Protein Calorie Malnutrition: Hx of metastatic lung cancer/chronic disease Outpatient follow-up  Failure to Thrive: multiple complicated medical issues Recurrent admissions/ED visit/office visits Discharged home with home health including home nursing, physical therapy, aide and Education officer, museum.  Chronic Pain: High risk for narcotic abuse Follow-up with primary care provider. Outpatient pain management referral might be needed.  Chronic diastolic congestive heart failure - Currently stable.  - Outpatient follow-up. Continue Coreg.  History of metastatic lung cancer with metastasis to the brain - CT scan of the chest with IV contrast could not be done because of IV infiltration. CT chest without contrast showed decreased size of pericardial effusion. MRI of the brain done today showed to follow-up on the lung cancer as patient is noncompliant to treatment and MRI of the brain done today showed decreasing size of the metastatic disease -[name] on phone today and discussed the plan of care. He is okay for the patient to be discharged home with outpatient follow-up with oncology at earliest convenience. - Counseled the patient about compliance and close outpatient follow-up   Discharge Instructions  Discharge Instructions    Call MD for:  difficulty breathing, headache or visual disturbances    Complete by:  As directed    Call MD for:  extreme fatigue    Complete by:  As directed    Call MD for:  hives    Complete by:  As directed    Call MD for:  persistant dizziness or light-headedness    Complete by:  As directed    Call MD for:  persistant nausea and vomiting    Complete by:  As directed    Call MD for:  severe uncontrolled pain    Complete by:  As directed    Call  MD for:  temperature >100.4    Complete by:  As directed    Diet - low sodium heart healthy    Complete by:  As directed    Discharge instructions    Complete  by:  As directed    Comply with follow up   Increase activity slowly    Complete by:  As directed      Allergies as of 04/25/2017      Reactions   Amlodipine Swelling   Meperidine Hcl Other (See Comments)   hallucinations   Naproxen Other (See Comments)   Extreme bruising      Medication List    STOP taking these medications   DULoxetine 30 MG capsule Commonly known as:  CYMBALTA   glipiZIDE 5 MG tablet Commonly known as:  GLUCOTROL   levETIRAcetam 500 MG tablet Commonly known as:  KEPPRA   pantoprazole 40 MG tablet Commonly known as:  PROTONIX     TAKE these medications   ALPRAZolam 0.5 MG tablet Commonly known as:  XANAX TAKE 1 TABLET BY MOUTH THREE TIMES DAILY AS NEEDED   blood glucose meter kit and supplies Kit Dispense based on patient and insurance preference. Use up to four times daily as directed. (FOR ICD-9 250.00, 250.01).   carvedilol 6.25 MG tablet Commonly known as:  COREG TAKE 1 TABLET(6.25 MG) BY MOUTH TWICE DAILY WITH A MEAL   gabapentin 300 MG capsule Commonly known as:  NEURONTIN Take 1 capsule (300 mg total) by mouth 2 (two) times daily.   Glycopyrrolate 25 MCG/ML Soln Commonly known as:  LONHALA MAGNAIR REFILL KIT Inhale 1 Act into the lungs 2 (two) times daily. What changed:  Another medication with the same name was removed. Continue taking this medication, and follow the directions you see here.   magnesium oxide 400 MG tablet Commonly known as:  MAG-OX Take 1 tablet (400 mg total) by mouth 3 (three) times daily after meals.   Oxycodone HCl 10 MG Tabs Take 1 tablet (10 mg total) by mouth 3 (three) times daily as needed.            Durable Medical Equipment        Start     Ordered   04/24/17 1713  For home use only DME 4 wheeled rolling walker with seat  Once    Question Answer Comment  Patient needs a walker to treat with the following condition COPD (chronic obstructive pulmonary disease) ([place])   Patient needs a walker  to treat with the following condition CHF (congestive heart failure) ([place])      04/24/17 1713     Follow-up Information    [name], MD Follow up in 1 week(s).   Specialty:  Internal Medicine Why:  with repeat bmp/Magnesium Contact information: [address] [place] [address] [phone]        Curt Bears, MD Follow up.   Specialty:  Oncology Why:  At earliest convenience Contact information: [place] [address] [phone]          Allergies  Allergen Reactions  . Amlodipine Swelling  . Meperidine Hcl Other (See Comments)    hallucinations  . Naproxen Other (See Comments)    Extreme bruising    Consultations:  Spoke to [name]/oncology on phone on 04/25/2017   Procedures/Studies: Ct Chest Wo Contrast  Result Date: 04/24/2017 CLINICAL DATA:  61 year old female with a history of pericardial effusion EXAM: CT CHEST WITHOUT CONTRAST (  no adequate IV access to allow for contrast administration) TECHNIQUE: Multidetector CT imaging of the chest was performed following the standard protocol without IV contrast. COMPARISON:  Prior CT scan of chest 03/10/2017 FINDINGS: Cardiovascular: Limited evaluation in the absence of intravenous contrast. No evidence of aortic aneurysm. Atherosclerotic calcifications are noted throughout the aorta. Calcifications are also present throughout the coronary arteries. The small residual pericardial effusion, significantly improved compared to prior. Mediastinum/Nodes: Unremarkable CT appearance of the thyroid gland. No suspicious mediastinal or hilar adenopathy. No soft tissue mediastinal mass. The thoracic esophagus is unremarkable. Lungs/Pleura: Advanced centrilobular pulmonary emphysema. No suspicious pulmonary nodule or mass. Bibasilar atelectasis. Diffuse mild bronchial wall thickening. Upper Abdomen: Cirrhotic liver morphology with splenomegaly and prominence of the portal vein  suggesting portal hypertension. Chronic calcific pancreatitis. Surgical changes of prior cholecystectomy. Musculoskeletal: No acute fracture or aggressive appearing lytic or blastic osseous lesion. IMPRESSION: 1. Persistent but significantly improved pericardial effusion compared to 03/10/2017. There is only trace residual pericardial fluid remaining. 2. Aortic and coronary artery atherosclerosis. 3. Chronic calcific pancreatitis. 4. Hepatic cirrhosis. 5. Advanced centrilobular pulmonary emphysema. 6. Diffuse mild bronchial wall thickening. Aortic Atherosclerosis (ICD10-I70.0) and Emphysema (ICD10-J43.9). Electronically Signed   By: Jacqulynn Cadet M.D.   On: 04/24/2017 14:33   Mr Jeri Cos GE Contrast  Result Date: 04/25/2017 CLINICAL DATA:  Non-small cell lung cancer with brain metastases. Progressive generalized weakness with falls. EXAM: MRI HEAD WITHOUT AND WITH CONTRAST TECHNIQUE: Multiplanar, multiecho pulse sequences of the brain and surrounding structures were obtained without and with intravenous contrast. CONTRAST:  56m MULTIHANCE GADOBENATE DIMEGLUMINE 529 MG/ML IV SOLN COMPARISON:  MRI of of the brain 02/10/2017 FINDINGS: Brain: The lesion involving the splenium of the corpus callosum has decreased in size following radiation. The lesion now measures 7 x 14 x 10 mm. Cystic necrosis and hemorrhage are evident within the lesion. Previously noted mass effect on the left lateral ventricle has resolved. Extensive white matter changes throughout the splenium of the corpus callosum have improved. T2 shine through is present on the diffusion weighted images without definite restricted diffusion. No other periventricular T2 hyperintensities extending into the corona radiata are stable. Mild generalized atrophy is noted. No additional areas of enhancement are present. The brainstem is within normal limits. Linear T2 hyperintensities in the cerebellum are stable. Vascular: Flow is present in the major  intracranial arteries. Skull and upper cervical spine: The skullbase is within normal limits. The craniocervical junction is normal. Left parietal craniotomy for stereotactic biopsy is again noted. Sinuses/Orbits: The paranasal sinuses and mastoid air cells are clear. The globes and orbits are within normal limits. IMPRESSION: 1. Decreasing size of focal enhancing lesion in the left splenium of the corpus callosum compatible with known metastatic disease. 2. Decreased mass effect associated with this lesion. 3. Blood products are evident within the lesion following biopsy. 4. Other T2 hyperintensities are stable. These may be related to a demyelinating process. 5. No other foci of enhancement to suggest metastatic disease elsewhere. Electronically Signed   By: CSan MorelleM.D.   On: 04/25/2017 07:36      Subjective: Patient seen and examined at bedside. She denies any overnight fever, nausea, vomiting, chest pain.  Discharge Exam: Vitals:   04/25/17 0455 04/25/17 0752  BP: 111/60   Pulse: 85 68  Resp: 18 16  Temp: 97.7 F (36.5 C)    Vitals:   04/24/17 1424 04/24/17 1950 04/25/17 0455 04/25/17 0752  BP: 112/62 135/72 111/60   Pulse: 100 63  85 68  Resp: _0 Temp: 98.8 F (37.1 C) 98.6 F (37 C) 97.7 F (36.5 C)   TempSrc: Oral Axillary Oral   SpO2: 99% 100% 100% 99%  Weight:      Height:        General: Pt is alert, awake, not in acute distress Cardiovascular: Rate controlled, S1/S2 + Respiratory: Bilateral decreased breath sounds at bases Abdominal: Soft, NT, ND, bowel sounds + Extremities: no edema, no cyanosis    The results of significant diagnostics from this hospitalization (including imaging, microbiology, ancillary and laboratory) are listed below for reference.     Microbiology: No results found for this or any previous visit (from the past 240 hour(s)).   Labs: BNP (last 3 results)  Recent Labs  01/06/17 0153 02/12/17 1331 03/10/17 2020   BNP 351.2* 412.7* 251.8*   Basic Metabolic Panel:  Recent Labs Lab 04/23/17 1912 04/24/17 0029 04/24/17 0605 04/24/17 1450 04/25/17 0524  NA 140 138 137 138 133*  K 2.4* 3.3* 2.9* 4.7 3.5  CL 94* 93* 93* 97* 95*  CO2 38* 37* 35* 33* 32  GLUCOSE 117* 162* 118* 141* 139*  BUN 31* 27* 27* 22* 15  CREATININE 1.13* 1.08* 0.94 0.80 0.61  CALCIUM 7.3* 7.5* 7.5* 7.7* 7.5*  MG 1.5* 1.6*  --  2.2 1.4*  PHOS  --  2.4*  --   --   --    Liver Function Tests:  Recent Labs Lab 04/23/17 1912 04/25/17 0524  AST 28 17  ALT 34 25  ALKPHOS 55 52  BILITOT 1.2 0.9  PROT 5.1* 5.1*  ALBUMIN 2.8* 2.6*   No results for input(s): LIPASE, AMYLASE in the last 168 hours. No results for input(s): AMMONIA in the last 168 hours. CBC:  Recent Labs Lab 04/23/17 1912 04/24/17 0605 04/25/17 0524  WBC 4.0 5.9 6.6  NEUTROABS 2.7  --  5.2  HGB 11.1* 11.8* 12.0  HCT 36.0 38.5 38.8  MCV 99.4 100.3* 99.0  PLT 102* 100* 106*   Cardiac Enzymes: No results for input(s): CKTOTAL, CKMB, CKMBINDEX, TROPONINI in the last 168 hours. BNP: Invalid input(s): POCBNP CBG:  Recent Labs Lab 04/24/17 0740 04/24/17 1153 04/24/17 1632 04/24/17 1956 04/25/17 0741  GLUCAP 112* 119* 105* 136* 130*   D-Dimer No results for input(s): DDIMER in the last 72 hours. Hgb A1c No results for input(s): HGBA1C in the last 72 hours. Lipid Profile No results for input(s): CHOL, HDL, LDLCALC, TRIG, CHOLHDL, LDLDIRECT in the last 72 hours. Thyroid function studies  Recent Labs  04/24/17 0029  TSH 3.061   Anemia work up No results for input(s): VITAMINB12, FOLATE, FERRITIN, TIBC, IRON, RETICCTPCT in the last 72 hours. Urinalysis    Component Value Date/Time   COLORURINE YELLOW 04/24/2017 1317   APPEARANCEUR HAZY (A) 04/24/2017 1317   LABSPEC 1.015 04/24/2017 1317   PHURINE 6.0 04/24/2017 1317   GLUCOSEU NEGATIVE 04/24/2017 1317   GLUCOSEU NEGATIVE 12/16/2016 1444   HGBUR NEGATIVE 04/24/2017 1317    BILIRUBINUR NEGATIVE 04/24/2017 1317   KETONESUR NEGATIVE 04/24/2017 1317   PROTEINUR NEGATIVE 04/24/2017 1317   UROBILINOGEN 0.2 12/16/2016 1444   NITRITE NEGATIVE 04/24/2017 1317   LEUKOCYTESUR NEGATIVE 04/24/2017 1317   Sepsis Labs Invalid input(s): PROCALCITONIN,  WBC,  LACTICIDVEN Microbiology No results found for this or any previous visit (from the past 240 hour(s)).   Time coordinating discharge: 35 minutes  SIGNED:   Aline August, MD  [facility] 04/25/2017, 9:20 AM Pager:  [phone]  If 7PM-7AM, please contact night-coverage [email or web address] Password [email or web address]

## 2017-04-25 NOTE — Evaluation (Signed)
Physical Therapy Evaluation Patient Details Name: Phyllis [name] MRN: [id number] DOB: 12-14-1955 Today's Date: 04/25/2017   History of Present Illness  61 yo female admitted with dehydration, FTT. Hx of CHF, COPD-O2 dep, NSCLC with brain mets, HEP C, falls, noncompliance    Clinical Impression  On eval, pt was Min guard assist for mobility. She walked ~25 feet with a RW. Remained on [place] O2. Pt declined to ambulate any further and requested return to bed. Assisted pt back to bed at her request. Pt presents with general weakness, decreased activity tolerance, and impaired gait and balance. No family present during session. Pt appeared drowsy throughout session. Recommend HHPT and 24 hour supervision/assist.     Follow Up Recommendations Home health PT;Supervision/Assistance - 24 hour    Equipment Recommendations  Rolling walker with 5" wheels (if pt doesn't already have one)    Recommendations for Other Services       Precautions / Restrictions Precautions Precautions: Fall Precaution Comments: O2 dep Restrictions Weight Bearing Restrictions: No      Mobility  Bed Mobility Overal bed mobility: Needs Assistance Bed Mobility: Supine to Sit;Sit to Supine     Supine to sit: Supervision Sit to supine: Supervision   General bed mobility comments: for safety, lines  Transfers Overall transfer level: Needs assistance Equipment used: Rolling walker (2 wheeled) Transfers: Sit to/from Stand Sit to Stand: Min guard         General transfer comment: close guard for safety. VCs hand placement  Ambulation/Gait Ambulation/Gait assistance: Min guard Ambulation Distance (Feet): 25 Feet Assistive device: Rolling walker (2 wheeled) Gait Pattern/deviations: Step-through pattern;Decreased stride length     General Gait Details: very slow gait speed. Cues for safety, attention to environment. Pt bumped into door frame with RW. Close guard for safety. Remained on [place] O2. Pt declined  to ambulate further and requested return to bed.   Stairs            Wheelchair Mobility    Modified Rankin (Stroke Patients Only)       Balance Overall balance assessment: Needs assistance;History of Falls           Standing balance-Leahy Scale: Poor                               Pertinent Vitals/Pain Pain Assessment: No/denies pain    Home Living Family/patient expects to be discharged to:: Private residence Living Arrangements: Spouse/significant other;Non-relatives/Friends (grandson) Available Help at Discharge: Family Type of Home: House Home Access: Stairs to enter Entrance Stairs-Rails: None Entrance Stairs-Number of Steps: 4 Home Layout: One level Home Equipment: Grab bars - tub/shower;Walker - 2 wheels;Other (comment) (O2)      Prior Function Level of Independence: Needs assistance   Gait / Transfers Assistance Needed: per pt, ambulatory without an assistive device.            Hand Dominance        Extremity/Trunk Assessment   Upper Extremity Assessment Upper Extremity Assessment: Generalized weakness    Lower Extremity Assessment Lower Extremity Assessment: Generalized weakness    Cervical / Trunk Assessment Cervical / Trunk Assessment: Normal  Communication   Communication: No difficulties  Cognition Arousal/Alertness:  (drowsy) Behavior During Therapy: Flat affect Overall Cognitive Status: Difficult to assess  General Comments      Exercises     Assessment/Plan    PT Assessment Patient needs continued PT services  PT Problem List Decreased strength;Decreased mobility;Decreased activity tolerance;Decreased balance;Decreased knowledge of use of DME       PT Treatment Interventions DME instruction;Gait training;Therapeutic activities;Therapeutic exercise;Patient/family education;Balance training;Functional mobility training    PT Goals (Current goals can  be found in the Care Plan section)  Acute Rehab PT Goals Patient Stated Goal: to get some sleep. home later today PT Goal Formulation: With patient Time For Goal Achievement: 05/09/17 Potential to Achieve Goals: Fair    Frequency Min 3X/week   Barriers to discharge        Co-evaluation               AM-PAC PT "6 Clicks" Daily Activity  Outcome Measure Difficulty turning over in bed (including adjusting bedclothes, sheets and blankets)?: A Little Difficulty moving from lying on back to sitting on the side of the bed? : A Little Difficulty sitting down on and standing up from a chair with arms (e.g., wheelchair, bedside commode, etc,.)?: A Little Help needed moving to and from a bed to chair (including a wheelchair)?: A Little Help needed walking in hospital room?: A Little Help needed climbing 3-5 steps with a railing? : A Little 6 Click Score: 18    End of Session Equipment Utilized During Treatment: Gait belt;Oxygen Activity Tolerance:  (Limited by drowsiness and pt's request to limit mobility) Patient left: in bed;with call bell/phone within reach;with bed alarm set   PT Visit Diagnosis: Muscle weakness (generalized) (M62.81);Difficulty in walking, not elsewhere classified (R26.2)    Time: 4920-1007 PT Time Calculation (min) (ACUTE ONLY): 15 min   Charges:   PT Evaluation $PT Eval Low Complexity: 1 Procedure     PT G Codes:   PT G-Codes **NOT FOR INPATIENT CLASS** Functional Assessment Tool Used: AM-PAC 6 Clicks Basic Mobility;Clinical judgement Functional Limitation: Mobility: Walking and moving around Mobility: Walking and Moving Around Current Status (H2197): At least 1 percent but less than 20 percent impaired, limited or restricted Mobility: Walking and Moving Around Goal Status [phone]): At least 1 percent but less than 20 percent impaired, limited or restricted      Phyllis [name], MPT Pager: [phone]

## 2017-04-25 NOTE — Telephone Encounter (Signed)
Requests f/u . Schedule request sent.

## 2017-04-25 NOTE — Progress Notes (Signed)
Pts IV removed with a clean and dry dressing intact. Pt denies pain at the time of discharge with no s/s of distress noted. Pt taken to front entrance via wheelchair with family and nursing staff present.

## 2017-04-27 ENCOUNTER — Ambulatory Visit
Admission: RE | Admit: 2017-04-27 | Discharge: 2017-04-27 | Disposition: A | Discharge status: Home / Self Care | Payer: Medicare Other | Source: Ambulatory Visit | Attending: Urology | Admitting: Urology

## 2017-04-27 ENCOUNTER — Encounter: Payer: Self-pay | Admitting: Urology

## 2017-04-27 ENCOUNTER — Encounter: Payer: Self-pay | Admitting: Internal Medicine

## 2017-04-27 ENCOUNTER — Ambulatory Visit (INDEPENDENT_AMBULATORY_CARE_PROVIDER_SITE_OTHER): Payer: Medicare Other | Admitting: Internal Medicine

## 2017-04-27 VITALS — BP 150/90 | HR 79 | Temp 98.1°F | Resp 20 | Ht 65.0 in | Wt 112.0 lb

## 2017-04-27 VITALS — BP 164/88 | HR 88 | Resp 20 | Wt 112.1 lb

## 2017-04-27 DIAGNOSIS — Z510 Encounter for antineoplastic radiation therapy: Secondary | ICD-10-CM | POA: Diagnosis not present

## 2017-04-27 DIAGNOSIS — E43 Unspecified severe protein-calorie malnutrition: Secondary | ICD-10-CM | POA: Diagnosis not present

## 2017-04-27 DIAGNOSIS — C3432 Malignant neoplasm of lower lobe, left bronchus or lung: Secondary | ICD-10-CM

## 2017-04-27 DIAGNOSIS — M5416 Radiculopathy, lumbar region: Secondary | ICD-10-CM

## 2017-04-27 DIAGNOSIS — R233 Spontaneous ecchymoses: Secondary | ICD-10-CM | POA: Diagnosis not present

## 2017-04-27 DIAGNOSIS — I313 Pericardial effusion (noninflammatory): Secondary | ICD-10-CM | POA: Diagnosis not present

## 2017-04-27 DIAGNOSIS — G893 Neoplasm related pain (acute) (chronic): Secondary | ICD-10-CM

## 2017-04-27 DIAGNOSIS — Z7984 Long term (current) use of oral hypoglycemic drugs: Secondary | ICD-10-CM | POA: Diagnosis not present

## 2017-04-27 DIAGNOSIS — Z79899 Other long term (current) drug therapy: Secondary | ICD-10-CM | POA: Diagnosis not present

## 2017-04-27 DIAGNOSIS — C7931 Secondary malignant neoplasm of brain: Secondary | ICD-10-CM | POA: Diagnosis not present

## 2017-04-27 DIAGNOSIS — E876 Hypokalemia: Secondary | ICD-10-CM | POA: Diagnosis not present

## 2017-04-27 DIAGNOSIS — C3490 Malignant neoplasm of unspecified part of unspecified bronchus or lung: Secondary | ICD-10-CM | POA: Diagnosis not present

## 2017-04-27 DIAGNOSIS — E860 Dehydration: Secondary | ICD-10-CM | POA: Diagnosis not present

## 2017-04-27 DIAGNOSIS — Z888 Allergy status to other drugs, medicaments and biological substances status: Secondary | ICD-10-CM | POA: Diagnosis not present

## 2017-04-27 MED ORDER — OXYCODONE HCL 10 MG PO TABS: 10.0000 mg | ORAL_TABLET | Freq: Three times a day (TID) | ORAL | 0 refills | Status: DC | PRN

## 2017-04-27 NOTE — Telephone Encounter (Signed)
I did not see any results in the [place].

## 2017-04-27 NOTE — Telephone Encounter (Signed)
Yes, it is okay

## 2017-04-27 NOTE — Telephone Encounter (Signed)
Pt was seen today and tried to fill her Oxycodone HCl 10 MG TABS  The pharmacy told her she is getting it refilled 8 days early and they need the ok from [place] to fil it early  [facility] on [address] [phone]

## 2017-04-27 NOTE — Patient Instructions (Signed)
Abdominal Pain, Adult Many things can cause belly (abdominal) pain. Most times, belly pain is not dangerous. Many cases of belly pain can be watched and treated at home. Sometimes belly pain is serious, though. Your doctor will try to find the cause of your belly pain. Follow these instructions at home:  Take over-the-counter and prescription medicines only as told by your doctor. Do not take medicines that help you poop (laxatives) unless told to by your doctor.  Drink enough fluid to keep your pee (urine) clear or pale yellow.  Watch your belly pain for any changes.  Keep all follow-up visits as told by your doctor. This is important. Contact a doctor if:  Your belly pain changes or gets worse.  You are not hungry, or you lose weight without trying.  You are having trouble pooping (constipated) or have watery poop (diarrhea) for more than 2-3 days.  You have pain when you pee or poop.  Your belly pain wakes you up at night.  Your pain gets worse with meals, after eating, or with certain foods.  You are throwing up and cannot keep anything down.  You have a fever. Get help right away if:  Your pain does not go away as soon as your doctor says it should.  You cannot stop throwing up.  Your pain is only in areas of your belly, such as the right side or the left lower part of the belly.  You have bloody or black poop, or poop that looks like tar.  You have very bad pain, cramping, or bloating in your belly.  You have signs of not having enough fluid or water in your body (dehydration), such as: ? Dark pee, very little pee, or no pee. ? Cracked lips. ? Dry mouth. ? Sunken eyes. ? Sleepiness. ? Weakness. This information is not intended to replace advice given to you by your health care provider. Make sure you discuss any questions you have with your health care provider. Document Released: 03/08/2008 Document Revised: 04/09/2016 Document Reviewed: 03/03/2016 Elsevier  Interactive Patient Education  2017 [employer].
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
## 2017-04-27 NOTE — Progress Notes (Signed)
Subjective:  Patient ID: [name], female    DOB: [birth year]  Age: 61 y.o. MRN: [id number]  CC: Abdominal Pain and Back Pain   HPI Debborah A Bibb presents for a refill of oxycodone for pain management. She was recently admitted for dehydration. She complains of persistent, diffuse pain and tells me that the oxycodone is helping. She has chronic abdominal pain and diarrhea. She feels weak, has a poor appetite, and continues to lose weight. She is agreeable to let hospice evaluate her.  Outpatient Medications Prior to Visit  Medication Sig Dispense Refill  . ALPRAZolam (XANAX) 0.5 MG tablet TAKE 1 TABLET BY MOUTH THREE TIMES DAILY AS NEEDED 90 tablet 3  . blood glucose meter kit and supplies KIT Dispense based on patient and insurance preference. Use up to four times daily as directed. (FOR ICD-9 250.00, 250.01). 1 each 0  . carvedilol (COREG) 6.25 MG tablet TAKE 1 TABLET(6.25 MG) BY MOUTH TWICE DAILY WITH A MEAL 60 tablet 0  . gabapentin (NEURONTIN) 300 MG capsule Take 1 capsule (300 mg total) by mouth 2 (two) times daily. 60 capsule 0  . Glycopyrrolate (LONHALA MAGNAIR REFILL KIT) 25 MCG/ML SOLN Inhale 1 Act into the lungs 2 (two) times daily. 60 mL 11  . magnesium oxide (MAG-OX) 400 MG tablet Take 1 tablet (400 mg total) by mouth 3 (three) times daily after meals. 30 tablet 0  . Oxycodone HCl 10 MG TABS Take 1 tablet (10 mg total) by mouth 3 (three) times daily as needed. 90 tablet 0   No facility-administered medications prior to visit.     ROS Review of Systems  Constitutional: Positive for activity change, appetite change, fatigue and unexpected weight change. Negative for chills and fever.  HENT: Negative.  Negative for trouble swallowing.   Eyes: Negative.   Respiratory: Positive for cough (productive of clear phlegm). Negative for apnea, choking, chest tightness, shortness of breath, wheezing and stridor.   Cardiovascular: Negative.  Negative for chest pain,  palpitations and leg swelling.  Gastrointestinal: Positive for abdominal pain and diarrhea. Negative for constipation, nausea and vomiting.  Endocrine: Negative.   Genitourinary: Negative.  Negative for difficulty urinating.  Musculoskeletal: Positive for back pain and myalgias.  Skin: Negative.   Allergic/Immunologic: Negative.   Neurological: Positive for headaches. Negative for dizziness and weakness.  Hematological: Negative.  Negative for adenopathy. Does not bruise/bleed easily.  Psychiatric/Behavioral: Positive for dysphoric mood and sleep disturbance. Negative for agitation, confusion, self-injury and suicidal ideas. The patient is nervous/anxious.     Objective:  BP (!) 150/90 (BP Location: Left Arm, Patient Position: Sitting, Cuff Size: Normal)   Pulse 79   Temp 98.1 F (36.7 C) (Oral)   Resp 20   Ht _0  (1.651 m)   Wt 112 lb (50.8 kg)   LMP 10/05/1999   SpO2 98%   BMI 18.64 kg/m   BP Readings from Last 3 Encounters:  04/27/17 (!) 164/88  04/27/17 (!) 150/90  04/25/17 111/60    Wt Readings from Last 3 Encounters:  04/27/17 112 lb 2 oz (50.9 kg)  04/27/17 112 lb (50.8 kg)  04/23/17 120 lb (54.4 kg)    Physical Exam  Constitutional: She is oriented to person, place, and time.  Non-toxic appearance. She has a sickly appearance. She appears ill. She appears distressed.  Thin Appears ill  HENT:  Mouth/Throat: Oropharynx is clear and moist. No oropharyngeal exudate.  Eyes: Conjunctivae are normal. Right eye exhibits no discharge. Left eye exhibits  no discharge. No scleral icterus.  Neck: Normal range of motion. Neck supple. No JVD present. No thyromegaly present.  Cardiovascular: Normal rate, regular rhythm and intact distal pulses.  Exam reveals no gallop.   No murmur heard. Pulmonary/Chest: Effort normal and breath sounds normal. No respiratory distress. She has no wheezes. She has no rales. She exhibits no tenderness.  Abdominal: Soft. Bowel sounds are normal.  She exhibits no distension and no mass. There is no tenderness. There is no rebound and no guarding.  Musculoskeletal: She exhibits no edema, tenderness or deformity.  Lymphadenopathy:    She has no cervical adenopathy.  Neurological: She is alert and oriented to person, place, and time.  Skin: Skin is warm and dry. No rash noted. She is not diaphoretic. No erythema. No pallor.  Psychiatric: Judgment and thought content normal. Her mood appears anxious. Her speech is delayed and tangential. Her speech is not rapid and/or pressured. She is slowed and withdrawn. She is not aggressive. Cognition and memory are normal. She exhibits a depressed mood. She expresses no homicidal and no suicidal ideation. She expresses no suicidal plans and no homicidal plans. She is inattentive.  Vitals reviewed.   Lab Results  Component Value Date   WBC 6.6 04/25/2017   HGB 12.0 04/25/2017   HCT 38.8 04/25/2017   PLT 106 (L) 04/25/2017   GLUCOSE 139 (H) 04/25/2017   CHOL 85 01/06/2017   TRIG 54 01/06/2017   HDL 35 (L) 01/06/2017   LDLCALC 39 01/06/2017   ALT 25 04/25/2017   AST 17 04/25/2017   NA 133 (L) 04/25/2017   K 3.5 04/25/2017   CL 95 (L) 04/25/2017   CREATININE 0.61 04/25/2017   BUN 15 04/25/2017   CO2 32 04/25/2017   TSH 3.061 04/24/2017   INR 1.08 01/19/2017   HGBA1C 7.4 (H) 02/08/2017   MICROALBUR 0.9 12/16/2016    Ct Chest Wo Contrast  Result Date: 04/24/2017 CLINICAL DATA:  61 year old female with a history of pericardial effusion EXAM: CT CHEST WITHOUT CONTRAST (no adequate IV access to allow for contrast administration) TECHNIQUE: Multidetector CT imaging of the chest was performed following the standard protocol without IV contrast. COMPARISON:  Prior CT scan of chest 03/10/2017 FINDINGS: Cardiovascular: Limited evaluation in the absence of intravenous contrast. No evidence of aortic aneurysm. Atherosclerotic calcifications are noted throughout the aorta. Calcifications are also present  throughout the coronary arteries. The small residual pericardial effusion, significantly improved compared to prior. Mediastinum/Nodes: Unremarkable CT appearance of the thyroid gland. No suspicious mediastinal or hilar adenopathy. No soft tissue mediastinal mass. The thoracic esophagus is unremarkable. Lungs/Pleura: Advanced centrilobular pulmonary emphysema. No suspicious pulmonary nodule or mass. Bibasilar atelectasis. Diffuse mild bronchial wall thickening. Upper Abdomen: Cirrhotic liver morphology with splenomegaly and prominence of the portal vein suggesting portal hypertension. Chronic calcific pancreatitis. Surgical changes of prior cholecystectomy. Musculoskeletal: No acute fracture or aggressive appearing lytic or blastic osseous lesion. IMPRESSION: 1. Persistent but significantly improved pericardial effusion compared to 03/10/2017. There is only trace residual pericardial fluid remaining. 2. Aortic and coronary artery atherosclerosis. 3. Chronic calcific pancreatitis. 4. Hepatic cirrhosis. 5. Advanced centrilobular pulmonary emphysema. 6. Diffuse mild bronchial wall thickening. Aortic Atherosclerosis (ICD10-I70.0) and Emphysema (ICD10-J43.9). Electronically Signed   By: Jacqulynn Cadet M.D.   On: 04/24/2017 14:33    Assessment & Plan:   Janiene was seen today for abdominal pain and back pain.  Diagnoses and all orders for this visit:  Squamous cancer of left lower lobe of lung ([place]) -  Ambulatory referral to Hospice  Brain metastasis [facility]) -     Ambulatory referral to Hospice -     Oxycodone HCl 10 MG TABS; Take 1 tablet (10 mg total) by mouth 3 (three) times daily as needed.  Cancer-related pain -     Ambulatory referral to Hospice -     Oxycodone HCl 10 MG TABS; Take 1 tablet (10 mg total) by mouth 3 (three) times daily as needed.  Protein-calorie malnutrition, severe  Right lumbar radiculitis -     Oxycodone HCl 10 MG TABS; Take 1 tablet (10 mg total) by mouth 3 (three)  times daily as needed.   I am having Ms. Wilhoite maintain her gabapentin, carvedilol, blood glucose meter kit and supplies, ALPRAZolam, Glycopyrrolate, magnesium oxide, and Oxycodone HCl.  Meds ordered this encounter  Medications  . Oxycodone HCl 10 MG TABS    Sig: Take 1 tablet (10 mg total) by mouth 3 (three) times daily as needed.    Dispense:  90 tablet    Refill:  0     Follow-up: Return if symptoms worsen or fail to improve.  [name], MD

## 2017-04-28 ENCOUNTER — Telehealth: Payer: Self-pay | Admitting: Internal Medicine

## 2017-04-28 NOTE — Telephone Encounter (Signed)
Pt called asking to speak with you. She would not go into detail with me regarding what it is about.

## 2017-04-28 NOTE — Telephone Encounter (Signed)
Called the number below - Pharmacy at [facility] on [address] stated that she did not use their pharmacy.   [facility] on [address] is the pharmacy that she uses.  Their number is [phone].   The [address] [facility] [place] to confirm if patient was there yesterday to fill.   Called the [place] MD response.

## 2017-04-28 NOTE — Telephone Encounter (Signed)
Pt wanted oxy filled. Taken care of in another message.

## 2017-04-30 DIAGNOSIS — J449 Chronic obstructive pulmonary disease, unspecified: Secondary | ICD-10-CM | POA: Diagnosis not present

## 2017-05-03 ENCOUNTER — Ambulatory Visit: Payer: Self-pay | Admitting: Internal Medicine

## 2017-05-05 ENCOUNTER — Telehealth: Payer: Self-pay

## 2017-05-05 NOTE — Telephone Encounter (Signed)
Dr. Jenene Slicker called and stated that pt is not a candidate for Hospice. Pt stated that she wanted to keep her appointment with oncology for possible treatment options.   Gave verbal okay to transition patient to palliative care.

## 2017-05-10 ENCOUNTER — Emergency Department (HOSPITAL_COMMUNITY)
Admission: EM | Admit: 2017-05-10 | Discharge: 2017-05-10 | Disposition: A | Discharge status: Home / Self Care | Payer: Medicare Other | Attending: Emergency Medicine | Admitting: Emergency Medicine

## 2017-05-10 ENCOUNTER — Emergency Department (HOSPITAL_COMMUNITY): Payer: Medicare Other

## 2017-05-10 DIAGNOSIS — J9811 Atelectasis: Secondary | ICD-10-CM | POA: Diagnosis not present

## 2017-05-10 DIAGNOSIS — I2510 Atherosclerotic heart disease of native coronary artery without angina pectoris: Secondary | ICD-10-CM | POA: Diagnosis not present

## 2017-05-10 DIAGNOSIS — R52 Pain, unspecified: Secondary | ICD-10-CM

## 2017-05-10 DIAGNOSIS — J45909 Unspecified asthma, uncomplicated: Secondary | ICD-10-CM | POA: Insufficient documentation

## 2017-05-10 DIAGNOSIS — Z79899 Other long term (current) drug therapy: Secondary | ICD-10-CM | POA: Diagnosis not present

## 2017-05-10 DIAGNOSIS — J449 Chronic obstructive pulmonary disease, unspecified: Secondary | ICD-10-CM | POA: Insufficient documentation

## 2017-05-10 DIAGNOSIS — Z87891 Personal history of nicotine dependence: Secondary | ICD-10-CM | POA: Insufficient documentation

## 2017-05-10 DIAGNOSIS — R9431 Abnormal electrocardiogram [ECG] [EKG]: Secondary | ICD-10-CM | POA: Diagnosis not present

## 2017-05-10 DIAGNOSIS — M791 Myalgia: Secondary | ICD-10-CM | POA: Diagnosis not present

## 2017-05-10 DIAGNOSIS — Z7982 Long term (current) use of aspirin: Secondary | ICD-10-CM | POA: Diagnosis not present

## 2017-05-10 DIAGNOSIS — E039 Hypothyroidism, unspecified: Secondary | ICD-10-CM | POA: Insufficient documentation

## 2017-05-10 DIAGNOSIS — M609 Myositis, unspecified: Secondary | ICD-10-CM | POA: Diagnosis not present

## 2017-05-10 DIAGNOSIS — E1140 Type 2 diabetes mellitus with diabetic neuropathy, unspecified: Secondary | ICD-10-CM | POA: Diagnosis not present

## 2017-05-10 DIAGNOSIS — F1120 Opioid dependence, uncomplicated: Secondary | ICD-10-CM | POA: Insufficient documentation

## 2017-05-10 DIAGNOSIS — I110 Hypertensive heart disease with heart failure: Secondary | ICD-10-CM | POA: Insufficient documentation

## 2017-05-10 DIAGNOSIS — I5032 Chronic diastolic (congestive) heart failure: Secondary | ICD-10-CM | POA: Insufficient documentation

## 2017-05-10 LAB — I-STAT CHEM 8, ED
BUN: 8 mg/dL (ref 6–20)
CALCIUM ION: 1.23 mmol/L (ref 1.15–1.40)
CREATININE: 0.6 mg/dL (ref 0.44–1.00)
Chloride: 100 mmol/L — ABNORMAL LOW (ref 101–111)
Glucose, Bld: 182 mg/dL — ABNORMAL HIGH (ref 65–99)
HEMATOCRIT: 37 % (ref 36.0–46.0)
HEMOGLOBIN: 12.6 g/dL (ref 12.0–15.0)
Potassium: 3.3 mmol/L — ABNORMAL LOW (ref 3.5–5.1)
SODIUM: 142 mmol/L (ref 135–145)
TCO2: 31 mmol/L (ref 0–100)

## 2017-05-10 LAB — HEMOGLOBIN A1C

## 2017-05-10 LAB — I-STAT TROPONIN, ED: Troponin i, poc: 0 ng/mL (ref 0.00–0.08)

## 2017-05-10 MED ORDER — POTASSIUM CHLORIDE CRYS ER 20 MEQ PO TBCR: 20.0000 meq | EXTENDED_RELEASE_TABLET | Freq: Two times a day (BID) | ORAL | 0 refills | Status: DC

## 2017-05-10 MED ORDER — HYDROMORPHONE HCL 1 MG/ML IJ SOLN
1.0000 mg | Freq: Once | INTRAMUSCULAR | Status: AC
  Administered 2017-05-10: 1 mg via INTRAMUSCULAR
  Filled 2017-05-10: qty 1

## 2017-05-10 MED ORDER — POTASSIUM CHLORIDE CRYS ER 20 MEQ PO TBCR
40.0000 meq | EXTENDED_RELEASE_TABLET | Freq: Once | ORAL | Status: AC
  Administered 2017-05-10: 40 meq via ORAL
  Filled 2017-05-10: qty 2

## 2017-05-10 MED ORDER — OXYCODONE-ACETAMINOPHEN 5-325 MG PO TABS
2.0000 | ORAL_TABLET | Freq: Once | ORAL | Status: AC
  Administered 2017-05-10: 2 via ORAL
  Filled 2017-05-10: qty 2

## 2017-05-10 NOTE — ED Notes (Signed)
Pt. Wear O2 [place] at home, waiting for husband to pick her up with her portable O2 tank. In room waiting.

## 2017-05-10 NOTE — ED Triage Notes (Signed)
Per EMS, pt is coming from home with complaints of generalized body pain 10/10 related to hx of lung cancer that has metastasized. Pt gets radiation treatments. Pt husband stated pt has a hx of drug abuse and misuses pain medications. Pt AO x4 and ambulates independently.

## 2017-05-10 NOTE — ED Provider Notes (Signed)
[facility] DEPT Provider Note   CSN: [id number] Arrival date & time: 05/10/17  1724     History   Chief Complaint Chief Complaint  Patient presents with  . Generalized Pain    HPI Phyllis [name] is a 61 y.o. female.  HPI   61 year old female with past medical history of chronic pain and opiate dependence here with ongoing pain. The patient states that she recently ran out of her pain medications. She subsequent presents for pain control. She states that she hurts "all over" and is unable to provide a certain area that is more painful than others. She states that her pain is an aching, throbbing pain that is similar to her chronic pain. She last took oxycodone this morning and is now out. She has an appointment with her doctor on Friday but is requesting analgesia at this time. She denies any worsening of her chronic shortness of breath or chest pain. Denies any other acute changes in her health. No fevers or chills. No recent falls.  Past Medical History:  Diagnosis Date  . Anxiety   . Asthma   . CHF (congestive heart failure) ([place])   . Chronic back pain    "in the small of my back and lower back" (01/11/2017)  . Chronic bronchitis ([place])   . Chronic pancreatitis ([place])   . COPD with emphysema ([place])   . DEPRESSION   . Eczema   . Edema 05/20/2010  . GERD 06/06/2009  . Hepatitis C    "took the treatment; I was cured" (01/11/2017)  . High cholesterol   . HYPERTENSION 06/06/2009  . Non-small cell lung cancer (NSCLC) ([place])    NSCLC/squamous cell with bilateral pulmonary nodules treated with stereotactic radiotherapy during the fall of 2017./notes 01/11/2017  . On home oxygen therapy    "3L; 24/7" (01/11/2017)  . OSTEOPOROSIS 06/06/2009  . 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 Pre-diabetes   . Prolonged QT interval 01/07/2017  . Squamous cell carcinoma 06/15/2016  . TOBACCO USE 06/05/2010  . Unspecified hypothyroidism 06/05/2010  . VITAMIN D DEFICIENCY  06/06/2009    Patient Active Problem List   Diagnosis Date Noted  . Thrombocytopenia ([place]) 04/23/2017  . Cancer-related pain 03/02/2017  . Bipolar disease, chronic ([place]) 02/12/2017  . Chronic diastolic heart failure, NYHA class 1 ([place]) 02/12/2017  . Protein-calorie malnutrition, severe 02/09/2017  . Opiate dependence, continuous ([place]) 02/08/2017  . AKI (acute kidney injury) ([place]) 02/08/2017  . Brain metastasis ([place])   . HLD (hyperlipidemia) 01/05/2017  . Chronic diastolic CHF (congestive heart failure) ([place]) 01/05/2017  . Squamous cancer of left lower lobe of lung ([place]) 06/15/2016  . Type II diabetes mellitus with manifestations ([place]) 04/08/2015  . Pulmonary hypertension due to COPD ([place])   . COPD (chronic obstructive pulmonary disease) with chronic bronchitis ([place]) 09/30/2014  . Visit for screening mammogram 09/30/2014  . Routine general medical examination at a health care facility 09/30/2014  . Hypokalemia 02/21/2014  . Acute renal failure (ARF) ([place]) 03/05/2013  . CAD (coronary artery disease), native coronary artery 03/05/2013  . Right lumbar radiculitis 08/06/2010  . Hypothyroidism 06/05/2010  . TOBACCO USE 06/05/2010  . Hep C w/o coma, chronic ([place]) 06/06/2009  . Depression with anxiety 06/06/2009  . GERD 06/06/2009    Past Surgical History:  Procedure Laterality Date  . APPLICATION OF CRANIAL NAVIGATION Left 01/19/2017   Procedure: APPLICATION OF CRANIAL NAVIGATION;  Surgeon: Consuella Lose, MD;  Location: [facility];  Service: Neurosurgery;  Laterality: Left;  .  BRAIN SURGERY    . BREAST BIOPSY Right ~ 2000   "needle biopsy"   . CARDIAC CATHETERIZATION    . COLONOSCOPY    . DILATION AND CURETTAGE OF UTERUS    . LAPAROSCOPIC CHOLECYSTECTOMY    . LEFT HEART CATHETERIZATION WITH CORONARY ANGIOGRAM N/A 03/08/2013   Procedure: LEFT HEART CATHETERIZATION WITH CORONARY ANGIOGRAM;  Surgeon: Clent Demark, MD;  Location: [facility] CATH LAB;  Service: Cardiovascular;  Laterality: N/A;    . LUMBAR FUSION    . LUMBAR LAMINECTOMY    . PR DURAL GRAFT REPAIR,SPINE DEFECT Left 01/19/2017   Procedure: LEFT CRANIOTOMY FOR STERIOTACTIC BRAIN BIOPSY;  Surgeon: Consuella Lose, MD;  Location: [facility];  Service: Neurosurgery;  Laterality: Left;  LEFT CRANIOTOMY FOR STERIOTACTIC BRAIN BIOPSY  . TONSILLECTOMY    . TUBAL LIGATION      OB History    No data available       Home Medications    Prior to Admission medications   Medication Sig Start Date End Date Taking? Authorizing Provider  ALPRAZolam Duanne Moron) 0.5 MG tablet TAKE 1 TABLET BY MOUTH THREE TIMES DAILY AS NEEDED 04/05/17  Yes [name], MD  aspirin EC 81 MG tablet Take 81 mg by mouth daily.   Yes [provider]  carvedilol (COREG) 6.25 MG tablet TAKE 1 TABLET(6.25 MG) BY MOUTH TWICE DAILY WITH A MEAL 02/17/17  Yes Hongalgi, Lenis Dickinson, MD  gabapentin (NEURONTIN) 300 MG capsule Take 1 capsule (300 mg total) by mouth 2 (two) times daily. 02/17/17  Yes Hongalgi, Lenis Dickinson, MD  Oxycodone HCl 10 MG TABS Take 1 tablet (10 mg total) by mouth 3 (three) times daily as needed. Patient taking differently: Take 10 mg by mouth 3 (three) times daily as needed (moderate to servere pain).  04/27/17  Yes [name], MD  blood glucose meter kit and supplies KIT Dispense based on patient and insurance preference. Use up to four times daily as directed. (FOR ICD-9 250.00, 250.01). 02/17/17   Hongalgi, Lenis Dickinson, MD  Glycopyrrolate [facility] REFILL KIT) 25 MCG/ML SOLN Inhale 1 Act into the lungs 2 (two) times daily. Patient not taking: Reported on 05/10/2017 04/05/17   [name], MD  magnesium oxide (MAG-OX) 400 MG tablet Take 1 tablet (400 mg total) by mouth 3 (three) times daily after meals. Patient not taking: Reported on 05/10/2017 04/25/17   Aline August, MD  potassium chloride SA (K-DUR,KLOR-CON) 20 MEQ tablet Take 1 tablet (20 mEq total) by mouth 2 (two) times daily. 05/10/17 05/13/17  Duffy Bruce, MD    Family  History Family History  Problem Relation Age of Onset  . COPD Father   . Hypertension Mother   . Kidney disease Mother   . Colon cancer Neg Hx   . Stomach cancer Neg Hx     Social History Social History  Substance Use Topics  . Smoking status: Former Smoker    Packs/day: 0.10    Years: 35.00    Types: Cigarettes    Quit date: 04/03/2015  . Smokeless tobacco: Never Used  . Alcohol use No     Allergies   Amlodipine and Meperidine hcl   Review of Systems Review of Systems  Constitutional: Positive for fatigue. Negative for chills and fever.  HENT: Negative for congestion and rhinorrhea.   Eyes: Negative for visual disturbance.  Respiratory: Negative for cough, shortness of breath and wheezing.   Cardiovascular: Negative for chest pain and leg swelling.  Gastrointestinal: Negative  for abdominal pain, diarrhea, nausea and vomiting.  Genitourinary: Negative for dysuria and flank pain.  Musculoskeletal: Positive for arthralgias and myalgias. Negative for neck pain and neck stiffness.  Skin: Negative for rash and wound.  Allergic/Immunologic: Negative for immunocompromised state.  Neurological: Negative for syncope, weakness and headaches.  All other systems reviewed and are negative.    Physical Exam Updated Vital Signs BP 121/69 (BP Location: Right Arm)   Pulse 83   Temp 97.8 F (36.6 C) (Oral)   Resp 18   LMP 10/05/1999   SpO2 100%   Physical Exam  Constitutional: She is oriented to person, place, and time. She appears well-developed and well-nourished. No distress.  Chronically ill-appearing  HENT:  Head: Normocephalic and atraumatic.  Eyes: Conjunctivae are normal.  Neck: Neck supple.  Cardiovascular: Normal rate, regular rhythm and normal heart sounds.  Exam reveals no friction rub.   No murmur heard. Pulmonary/Chest: Effort normal and breath sounds normal. No respiratory distress. She has no wheezes. She has no rales.  Scattered, mild wheezes. Normal  aeration and work of breathing.  Abdominal: Soft. She exhibits no distension.  Musculoskeletal: She exhibits no edema.  Neurological: She is alert and oriented to person, place, and time. She exhibits normal muscle tone.  Skin: Skin is warm. Capillary refill takes less than 2 seconds.  Psychiatric: She has a normal mood and affect.  Nursing note and vitals reviewed.    ED Treatments / Results  Labs (all labs ordered are listed, but only abnormal results are displayed) Labs Reviewed  I-STAT CHEM 8, ED - Abnormal; Notable for the following:       Result Value   Potassium 3.3 (*)    Chloride 100 (*)    Glucose, Bld 182 (*)    All other components within normal limits  I-STAT TROPONIN, ED    EKG  EKG Interpretation  Date/Time:  Tuesday May 10 2017 19:09:10 EDT Ventricular Rate:  89 PR Interval:    QRS Duration: 86 QT Interval:  379 QTC Calculation: 462 R Axis:   85 Text Interpretation:  Sinus rhythm Borderline right axis deviation ST depressions in V2/V3 appear similar in morphology to prior EKGs Confirmed by Duffy Bruce [phone]) on 05/10/2017 7:20:42 PM       Radiology Dg Chest 2 View  Result Date: 05/10/2017 CLINICAL DATA:  Diffuse pain.  History of lung carcinoma EXAM: CHEST  2 VIEW COMPARISON:  March 10, 2017 chest radiograph; April 24, 2017 chest CT FINDINGS: There is atelectatic change in the left base. There is extensive underlying emphysematous change with areas of scarring and patchy fibrosis. Heart is upper normal in size with pulmonary vascular within normal limits. No evident adenopathy. There is aortic atherosclerosis. No bone lesions. IMPRESSION: Extensive underlying emphysematous change with patchy fibrosis. Atelectasis left base. No frank consolidation. Heart upper normal in size. No adenopathy evident. There is aortic atherosclerosis. Aortic Atherosclerosis (ICD10-I70.0) and Emphysema (ICD10-J43.9). Electronically Signed   By: Lowella Grip III M.D.   On:  05/10/2017 19:58    Procedures Procedures (including critical care time)  Medications Ordered in ED Medications  HYDROmorphone (DILAUDID) injection 1 mg (1 mg Intramuscular Given 05/10/17 1852)  potassium chloride SA (K-DUR,KLOR-CON) CR tablet 40 mEq (40 mEq Oral Given 05/10/17 2007)  oxyCODONE-acetaminophen (PERCOCET/ROXICET) 5-325 MG per tablet 2 tablet (2 tablets Oral Given 05/10/17 2114)     Initial Impression / Assessment and Plan / ED Course  I have reviewed the triage vital signs and the nursing notes.  Pertinent labs & imaging results that were available during my care of the patient were reviewed by me and considered in my medical decision making (see chart for details).     61 year old female here with generalized pain after running out of her oxycodone. She strongly denies any other changes in her health. Screening lab work obtained and is unremarkable. Of note, her EKG does have significant ST depressions, but these appear similar in morphology to her previous EKGs and troponin is negative. I discussed with Dr. Terrence Dupont, who has reviewed the EKG and given previous normal cath with similar EKG, does not feel acute intervention is indicated. She'll given a dose of analgesia here and will discharge with outpatient follow-up. I discussed that we were unable to provide additional prescriptions given that she has a pain contract in place.   This note was prepared with assistance of Systems analyst. Occasional wrong-word or sound-a-like substitutions may have occurred due to the inherent limitations of voice recognition software.  Final Clinical Impressions(s) / ED Diagnoses   Final diagnoses:  Generalized pain  Uncomplicated opioid dependence [facility])    New Prescriptions Discharge Medication List as of 05/10/2017  8:16 PM       Duffy Bruce, MD 05/10/17 2253

## 2017-05-10 NOTE — ED Notes (Signed)
Bed: WA09 Expected date:  Expected time:  Means of arrival:  Comments: EMS 60s, generalized pain, cancer pt on radiation

## 2017-05-10 NOTE — ED Notes (Signed)
Patient transported to X-ray 

## 2017-05-11 ENCOUNTER — Telehealth: Payer: Self-pay

## 2017-05-11 ENCOUNTER — Emergency Department (HOSPITAL_COMMUNITY)
Admission: EM | Admit: 2017-05-11 | Discharge: 2017-05-13 | Discharge status: Against Medical Advice | Payer: Medicare Other | Attending: Emergency Medicine | Admitting: Emergency Medicine

## 2017-05-11 ENCOUNTER — Encounter (HOSPITAL_COMMUNITY): Payer: Self-pay | Admitting: Emergency Medicine

## 2017-05-11 ENCOUNTER — Emergency Department (HOSPITAL_COMMUNITY)
Admission: EM | Admit: 2017-05-11 | Discharge: 2017-05-11 | Disposition: A | Discharge status: Home / Self Care | Payer: Medicare Other

## 2017-05-11 ENCOUNTER — Telehealth: Payer: Self-pay | Admitting: Internal Medicine

## 2017-05-11 DIAGNOSIS — Z5321 Procedure and treatment not carried out due to patient leaving prior to being seen by health care provider: Secondary | ICD-10-CM | POA: Diagnosis not present

## 2017-05-11 DIAGNOSIS — M791 Myalgia: Secondary | ICD-10-CM | POA: Diagnosis not present

## 2017-05-11 DIAGNOSIS — R52 Pain, unspecified: Secondary | ICD-10-CM

## 2017-05-11 LAB — HEMOGLOBIN A1C
HEMOGLOBIN A1C: 6.3 % — AB (ref 4.8–5.6)
MEAN PLASMA GLUCOSE: 134 mg/dL

## 2017-05-11 MED ORDER — OXYCODONE HCL ER 10 MG PO T12A
EXTENDED_RELEASE_TABLET | ORAL | Status: AC
  Filled 2017-05-11: qty 1

## 2017-05-11 NOTE — Telephone Encounter (Signed)
No ma'am he is not going to give her any.Marland KitchenJohny [name]

## 2017-05-11 NOTE — Telephone Encounter (Signed)
She should call the police about missing pills
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
## 2017-05-11 NOTE — Telephone Encounter (Signed)
PA and supporting notes were faxed to [facility].
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
## 2017-05-11 NOTE — ED Notes (Signed)
Went to triage room to assess and triage patient but no one in room.
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
## 2017-05-11 NOTE — Telephone Encounter (Signed)
Patient has called back.  I have told patient that Dr. Ronnald Ramp is not going to give her a script before appointment time.  Patient wanted to know what to do for pain.  I have advised patient to go to the ED if she gets to feeling really bad.
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
## 2017-05-12 ENCOUNTER — Telehealth: Payer: Self-pay

## 2017-05-12 ENCOUNTER — Encounter: Payer: Self-pay | Admitting: Internal Medicine

## 2017-05-12 ENCOUNTER — Ambulatory Visit (INDEPENDENT_AMBULATORY_CARE_PROVIDER_SITE_OTHER): Payer: Medicare Other | Admitting: Internal Medicine

## 2017-05-12 VITALS — BP 160/100 | HR 99 | Temp 98.4°F | Resp 20 | Ht 65.0 in | Wt 119.0 lb

## 2017-05-12 DIAGNOSIS — G893 Neoplasm related pain (acute) (chronic): Secondary | ICD-10-CM

## 2017-05-12 DIAGNOSIS — R531 Weakness: Secondary | ICD-10-CM | POA: Diagnosis not present

## 2017-05-12 MED ORDER — FENTANYL 50 MCG/HR TD PT72: 50.0000 ug | MEDICATED_PATCH | TRANSDERMAL | 0 refills | Status: DC

## 2017-05-12 NOTE — Patient Instructions (Signed)
Death and Dying When a person's health care team determines that a terminal illness can no longer be controlled, medical testing and treatment often stop. But the person's care continues. The care focuses on making the person comfortable. The person receives medicines and treatments to control pain and other symptoms, such as constipation, nausea, and shortness of breath. Some people remain at home during this time, while others enter a hospice or other facility. Either way, services are available to help individuals and their families with the medical, psychological, and spiritual issues surrounding dying. A hospice team often provides such services. The time at the end of life is different for each person. Individuals and their families have unique needs for information and support. Family members often want to know how long their loved one is expected to live. This is a hard question to answer. Factors such as where the disease is located and whether the person has other illnesses can affect how long a person is expected to live. Questions and concerns about the end of life should be discussed with the health care team as they arise. When to ask for assistance When caring for your loved one at home, there may be times when you need assistance from your loved one's health care team. You can contact the health care team for help in any of the following situations:  Your loved one is in pain that is not relieved by the prescribed dose of pain medicine.  Your loved one shows discomfort, such as grimacing or moaning.  Your loved one is having trouble breathing and seems upset.  Your loved one is unable to urinate or empty the bowels.  Your loved one has fallen.  Your loved one is very depressed or talking about committing suicide.  You have difficulty giving medicine to your loved one.  You are overwhelmed by caring for your loved one or are too grieved or afraid to be with your loved one.  Any  time you do not know how to handle a situation.  Providing care and comfort Managing pain  Contact the health care provider if the prescribed pain-relieving medicine dose does not seem to help. With the help of the health care team, you can also explore methods such as massage and relaxation techniques to help with pain.  Reposition your loved one's body from one side to the back to the other side every few hours to prevent bed sores. Try to minimize pressure under heels and elbows by placing a pillow or foam pads where needed. Giving comfort  Keep your loved one as clean, dry, and comfortable as possible. Place disposable pads on the bed beneath the person and remove them when they become soiled.  Blankets can be used to warm your loved one. Although your loved one's skin may be cool, he or she is usually not aware of feeling cold. You should avoid warming your loved one with electric blankets or heating pads. These can cause burns.  Breathing may be easier if you turn your loved one's body to the side and place pillows beneath the head and behind the back. Although labored breathing can sound very distressing to you, gurgling and rattling sounds do not cause discomfort to your loved one. An external source of oxygen may benefit some individuals. If your loved one is able to swallow, ice chips also may help. In addition, a cool mist humidifier may help make your loved one's breathing more comfortable. You may also use a fan to  circulate the air.  Allow your loved one to choose if and when to eat or drink. Ice chips, water, or juice may be refreshing if the person can swallow. Keep the person's mouth and lips moist with products such as glycerin swabs and lip balm. Emotional support Everyone has different needs, but some emotions are common to most individuals who are dying. These include fear of abandonment and fear of being a burden. They also have concerns about loss of dignity and loss of  control. Some ways you can provide comfort are as follows:  Keep your loved one company. Talk, watch movies, read, or just be with him or her.  Allow your loved one to express fears and concerns about dying, such as leaving family and friends behind. Be prepared to listen.  Be willing to reminisce about your loved one's life.  Avoid withholding difficult information. Most people prefer to be included in discussions about issues that concern them.  Reassure your loved one that you will honor advance directives, such as living wills.  Ask if there is anything you can do.  Respect your loved one's need for privacy.  Communicating with a dying loved one  Plan visits and activities for times when your loved one is alert. It is important to speak directly to your loved one and talk as if he or she can hear, even if there is no response. Most people are still able to hear after they are no longer able to speak. Your loved one should not be shaken if he or she does not respond.  Gently remind your loved one of the time, date, and people who are present. If your loved one is agitated, do not attempt to restrain him or her. Be calm and reassuring. Speaking calmly may help to re-orient your loved one. What to expect when death is near Certain signs and symptoms can help you anticipate when death is near. They are described below, along with suggestions for managing them. It is important to remember that not every person experiences each of the signs and symptoms. Having one or more of these symptoms does not always indicate that your loved one is close to death. A member of your loved one's health care team can give you information about what to expect.  Drowsiness, increased sleep, or unresponsiveness. This is caused by changes in the person's metabolism.  Confusion about time, place, or identity of family and friends; restlessness; visions of people and places that are not present; pulling at bed  linens or clothing (caused in part by changes in your loved one's metabolism).  Withdrawal and decreased socialization. This may be caused by decreased oxygen to the brain, decreased blood flow, and mental preparation for dying.  Decreased need for food and fluids, and loss of appetite. This is caused by the body's need to conserve energy and its decreasing ability to use food and fluids properly.  Loss of bladder or bowel control. This is caused by the relaxing of muscles in the pelvic area. You can talk to your loved one's health care team about the possibility of inserting a catheter. A member of the health care team can teach you how to take care of the catheter, if one is needed.  Skin becomes cool to the touch, particularly the hands and feet. Skin may become bluish in color, especially on the underside of the body. This is caused by decreased circulation to the extremities.  Rattling or gurgling sounds while breathing that may  be loud; breathing that is irregular and shallow; decreased number of breaths per minute; breathing that alternates between rapid and slow. This is caused by congestion from fluid, a buildup of waste products in the body, and a decrease in circulation to the organs.  Turning the head toward a light source. This is caused by decreasing vision. Leave soft, indirect lights on in the room.  Increased difficulty controlling pain. This is caused by progression of the disease. It is important to provide pain medicines as your loved one's health care provider has prescribed.  Involuntary movements, changes in heart rate, and loss of reflexes in the legs and arms are also signs that the end of life is near.  What are the signs that the person has died?  There is no breathing or pulse.  The eyes do not move or blink, and the pupils are enlarged (dilated) and do not change with light. The eyelids may be slightly open.  The jaw is relaxed, and the mouth is slightly  open.  The body releases the bowel and bladder contents.  The person does not respond to being touched or spoken to. What should happen after the person has died? After the person has passed away, there is no need to hurry with arrangements. Family members and close friends may wish to sit with the person, talk, or pray. When the family is ready, the following steps can be taken: 1. Place your loved one on his or her back with one pillow under the head. If necessary, you may wish to put your loved one's dentures or other artificial parts in place. 2. If your loved one is in a hospice program, follow the guidelines provided by the program. You can request a hospice nurse to verify the person's death. 3. Contact the appropriate authorities in accordance with local regulations. If your loved one has requested not to be resuscitated through a Do Not Resuscitate (DNR) order or other mechanism, do not call 911. 4. Contact your loved one's health care provider and funeral home. 5. Contact other family members, friends, and clergy who may not be aware of your loved one's passing. 6. Obtain emotional support to cope with the loss of your loved one.  Questions to ask about death and dying  How long is the person expected to live? Where to find more information:  [facility] Organization: http://www.brown-buchanan.com/  National Institute on Aging: http://kim-miller.com/ This information is not intended to replace advice given to you by your health care provider. Make sure you discuss any questions you have with your health care provider. Document Released: 06/29/2008 Document Revised: 02/26/2016 Document Reviewed: 03/11/2013 Elsevier Interactive Patient Education  2017 [employer].

## 2017-05-12 NOTE — Progress Notes (Signed)
Subjective:  Patient ID: Phyllis [name], female    DOB: 18-Dec-1955  Age: 61 y.o. MRN: [id number]  CC: Hypertension; COPD; and Headache   HPI Daisia A Goodson presents for follow-up on pain management. She has been taking oxycodone for pain but she tells me it's not very effective and she thinks her husband or somebody else in the house is stealing her oxycodone pills. She is not willing to call law enforcement about this. She complains of diffuse widespread pain. She was recently enrolled in palliative care.  Outpatient Medications Prior to Visit  Medication Sig Dispense Refill  . ALPRAZolam (XANAX) 0.5 MG tablet TAKE 1 TABLET BY MOUTH THREE TIMES DAILY AS NEEDED (Patient taking differently: TAKE 1 TABLET BY MOUTH THREE TIMES DAILY AS NEEDED FOR ANXIETY) 90 tablet 3  . aspirin EC 81 MG tablet Take 81 mg by mouth daily.    . blood glucose meter kit and supplies KIT Dispense based on patient and insurance preference. Use up to four times daily as directed. (FOR ICD-9 250.00, 250.01). 1 each 0  . carvedilol (COREG) 6.25 MG tablet TAKE 1 TABLET(6.25 MG) BY MOUTH TWICE DAILY WITH A MEAL 60 tablet 0  . gabapentin (NEURONTIN) 300 MG capsule Take 1 capsule (300 mg total) by mouth 2 (two) times daily. 60 capsule 0  . Glycopyrrolate (LONHALA MAGNAIR REFILL KIT) 25 MCG/ML SOLN Inhale 1 Act into the lungs 2 (two) times daily. 60 mL 11  . magnesium oxide (MAG-OX) 400 MG tablet Take 1 tablet (400 mg total) by mouth 3 (three) times daily after meals. 30 tablet 0  . potassium chloride SA (K-DUR,KLOR-CON) 20 MEQ tablet Take 1 tablet (20 mEq total) by mouth 2 (two) times daily. 6 tablet 0  . Oxycodone HCl 10 MG TABS Take 1 tablet (10 mg total) by mouth 3 (three) times daily as needed. (Patient taking differently: Take 10 mg by mouth 3 (three) times daily as needed (moderate to servere pain). ) 90 tablet 0   No facility-administered medications prior to visit.     ROS Review of Systems  Constitutional:  Positive for activity change, appetite change, fatigue and unexpected weight change. Negative for chills, diaphoresis and fever.  HENT: Negative.  Negative for trouble swallowing.   Eyes: Negative for visual disturbance.  Respiratory: Positive for cough, shortness of breath and wheezing. Negative for choking, chest tightness and stridor.   Cardiovascular: Positive for chest pain. Negative for palpitations and leg swelling.  Gastrointestinal: Positive for abdominal pain. Negative for constipation, diarrhea, nausea and vomiting.  Endocrine: Negative.   Genitourinary: Negative.  Negative for difficulty urinating.  Musculoskeletal: Positive for arthralgias, back pain and myalgias.  Skin: Negative.   Allergic/Immunologic: Negative.   Neurological: Positive for weakness and headaches. Negative for dizziness.  Hematological: Negative for adenopathy. Does not bruise/bleed easily.  Psychiatric/Behavioral: Positive for dysphoric mood and sleep disturbance. The patient is nervous/anxious.     Objective:  BP (!) 160/100 (BP Location: Left Arm, Patient Position: Sitting, Cuff Size: Normal)   Pulse 99   Temp 98.4 F (36.9 C) (Oral)   Ht _0  (1.651 m)   Wt 119 lb (54 kg)   LMP 10/05/1999   SpO2 97%   BMI 19.80 kg/m   BP Readings from Last 3 Encounters:  05/11/17 128/86  05/12/17 (!) 160/100  05/10/17 121/69    Wt Readings from Last 3 Encounters:  05/12/17 119 lb (54 kg)  04/27/17 112 lb 2 oz (50.9 kg)  04/27/17 112 lb (  50.8 kg)    Physical Exam  Constitutional: She is oriented to person, place, and time.  Non-toxic appearance. She has a sickly appearance. She appears ill. She appears distressed.  HENT:  Mouth/Throat: Oropharynx is clear and moist. No oropharyngeal exudate.  Eyes: Conjunctivae are normal. Right eye exhibits no discharge. Left eye exhibits no discharge. No scleral icterus.  Neck: Normal range of motion. Neck supple. No JVD present. No thyromegaly present.    Cardiovascular: Normal rate, regular rhythm and intact distal pulses.  Exam reveals no gallop and no friction rub.   No murmur heard. Pulmonary/Chest: No accessory muscle usage. Tachypnea noted. No respiratory distress. She has no decreased breath sounds. She has wheezes. She has rhonchi in the right upper field, the right middle field, the right lower field, the left upper field, the left middle field and the left lower field. She has no rales.  Abdominal: Soft. Bowel sounds are normal. She exhibits no distension and no mass. There is no tenderness. There is no rebound and no guarding.  Musculoskeletal: Normal range of motion. She exhibits no edema, tenderness or deformity.  Lymphadenopathy:    She has no cervical adenopathy.  Neurological: She is alert and oriented to person, place, and time.  Skin: Skin is warm and dry. No rash noted. No erythema. No pallor.  Vitals reviewed.   Lab Results  Component Value Date   WBC 6.6 04/25/2017   HGB 12.6 05/10/2017   HCT 37.0 05/10/2017   PLT 106 (L) 04/25/2017   GLUCOSE 182 (H) 05/10/2017   CHOL 85 01/06/2017   TRIG 54 01/06/2017   HDL 35 (L) 01/06/2017   LDLCALC 39 01/06/2017   ALT 25 04/25/2017   AST 17 04/25/2017   NA 142 05/10/2017   K 3.3 (L) 05/10/2017   CL 100 (L) 05/10/2017   CREATININE 0.60 05/10/2017   BUN 8 05/10/2017   CO2 32 04/25/2017   TSH 3.061 04/24/2017   INR 1.08 01/19/2017   HGBA1C 6.3 (H) 40/76/8088   PJSR1R DUPLICATE REQUEST 94/58/5929   MICROALBUR 0.9 12/16/2016    No results found.  Assessment & Plan:   Ezme was seen today for hypertension, copd and headache.  Diagnoses and all orders for this visit:  Cancer-related pain- we will upgrade to fentanyl patch to control her pain. -     Discontinue: fentaNYL (DURAGESIC - DOSED MCG/HR) 50 MCG/HR; Place 1 patch (50 mcg total) onto the skin every 3 (three) days. -     fentaNYL (DURAGESIC - DOSED MCG/HR) 50 MCG/HR; Place 1 patch (50 mcg total) onto the  skin every 3 (three) days.   I have discontinued Ms. Fiorentino's Oxycodone HCl. I am also having her maintain her gabapentin, carvedilol, blood glucose meter kit and supplies, ALPRAZolam, Glycopyrrolate, magnesium oxide, aspirin EC, potassium chloride SA, and fentaNYL.  Meds ordered this encounter  Medications  . DISCONTD: fentaNYL (DURAGESIC - DOSED MCG/HR) 50 MCG/HR    Sig: Place 1 patch (50 mcg total) onto the skin every 3 (three) days.    Dispense:  10 patch    Refill:  0  . fentaNYL (DURAGESIC - DOSED MCG/HR) 50 MCG/HR    Sig: Place 1 patch (50 mcg total) onto the skin every 3 (three) days.    Dispense:  10 patch    Refill:  0    reprint     Follow-up: Return if symptoms worsen or fail to improve.  [name], MD

## 2017-05-12 NOTE — Telephone Encounter (Signed)
Enid Derry, NP saw patient today and has some concerns.   Pt is weak and forgetful. NP stated that [facility] may be resource for patient. Enid Derry was concerned because pt stated that she has a couple of family members that come over and sell medication to her because she is running. Enid Derry stated that she feels some of her symptoms are withdrawal.   Call was lost and not return call due to not having her number.

## 2017-05-13 ENCOUNTER — Telehealth: Payer: Self-pay | Admitting: Internal Medicine

## 2017-05-13 DIAGNOSIS — J449 Chronic obstructive pulmonary disease, unspecified: Secondary | ICD-10-CM

## 2017-05-13 MED ORDER — GLYCOPYRROLATE 25 MCG/ML IN SOLN: 1.0000 | Freq: Two times a day (BID) | RESPIRATORY_TRACT | 11 refills | Status: DC

## 2017-05-13 MED ORDER — GLYCOPYRROLATE 25 MCG/ML IN SOLN: 1.0000 | Freq: Two times a day (BID) | RESPIRATORY_TRACT | 11 refills | Status: AC

## 2017-05-13 NOTE — Addendum Note (Signed)
Addended by: Aviva Signs M on: 05/13/2017 01:31 PM   Modules accepted: Orders

## 2017-05-13 NOTE — Telephone Encounter (Signed)
[facility] called in and said that they need rx for Glycopyrrolate [facility] MAGNAIR REFILL KIT) 25 MCG/ML SOLN [[id number]] fax over to them at :  [phone]- fax number  [phone]- phone number

## 2017-05-13 NOTE — Telephone Encounter (Signed)
MD sent rx to [facility] on 8/9, resent to mail order pharmacy...Johny Chess

## 2017-05-17 ENCOUNTER — Telehealth: Payer: Self-pay

## 2017-05-17 NOTE — Telephone Encounter (Signed)
rx for the fentanyl patch printed twice. September rx is in the cabinet.

## 2017-05-19 ENCOUNTER — Encounter (HOSPITAL_COMMUNITY): Payer: Self-pay

## 2017-05-19 ENCOUNTER — Emergency Department (HOSPITAL_COMMUNITY)
Admission: EM | Admit: 2017-05-19 | Discharge: 2017-05-19 | Disposition: A | Discharge status: Home / Self Care | Payer: Medicare Other | Attending: Emergency Medicine | Admitting: Emergency Medicine

## 2017-05-19 ENCOUNTER — Telehealth: Payer: Self-pay | Admitting: Internal Medicine

## 2017-05-19 ENCOUNTER — Emergency Department (HOSPITAL_COMMUNITY): Payer: Medicare Other

## 2017-05-19 DIAGNOSIS — C7931 Secondary malignant neoplasm of brain: Secondary | ICD-10-CM

## 2017-05-19 DIAGNOSIS — R069 Unspecified abnormalities of breathing: Secondary | ICD-10-CM | POA: Diagnosis not present

## 2017-05-19 DIAGNOSIS — J449 Chronic obstructive pulmonary disease, unspecified: Secondary | ICD-10-CM

## 2017-05-19 DIAGNOSIS — I509 Heart failure, unspecified: Secondary | ICD-10-CM | POA: Insufficient documentation

## 2017-05-19 DIAGNOSIS — Z7982 Long term (current) use of aspirin: Secondary | ICD-10-CM | POA: Diagnosis not present

## 2017-05-19 DIAGNOSIS — J4489 Other specified chronic obstructive pulmonary disease: Secondary | ICD-10-CM

## 2017-05-19 DIAGNOSIS — R270 Ataxia, unspecified: Secondary | ICD-10-CM | POA: Diagnosis not present

## 2017-05-19 DIAGNOSIS — R51 Headache: Secondary | ICD-10-CM | POA: Diagnosis not present

## 2017-05-19 DIAGNOSIS — Z79899 Other long term (current) drug therapy: Secondary | ICD-10-CM | POA: Diagnosis not present

## 2017-05-19 DIAGNOSIS — C799 Secondary malignant neoplasm of unspecified site: Secondary | ICD-10-CM | POA: Diagnosis not present

## 2017-05-19 DIAGNOSIS — Z87891 Personal history of nicotine dependence: Secondary | ICD-10-CM | POA: Diagnosis not present

## 2017-05-19 DIAGNOSIS — F1120 Opioid dependence, uncomplicated: Secondary | ICD-10-CM

## 2017-05-19 DIAGNOSIS — R05 Cough: Secondary | ICD-10-CM | POA: Diagnosis not present

## 2017-05-19 DIAGNOSIS — R0600 Dyspnea, unspecified: Secondary | ICD-10-CM | POA: Insufficient documentation

## 2017-05-19 DIAGNOSIS — I110 Hypertensive heart disease with heart failure: Secondary | ICD-10-CM | POA: Insufficient documentation

## 2017-05-19 DIAGNOSIS — E039 Hypothyroidism, unspecified: Secondary | ICD-10-CM | POA: Diagnosis not present

## 2017-05-19 DIAGNOSIS — R0602 Shortness of breath: Secondary | ICD-10-CM | POA: Diagnosis present

## 2017-05-19 DIAGNOSIS — C3432 Malignant neoplasm of lower lobe, left bronchus or lung: Secondary | ICD-10-CM

## 2017-05-19 LAB — BASIC METABOLIC PANEL
Anion gap: 6 (ref 5–15)
BUN: 6 mg/dL (ref 6–20)
CALCIUM: 8.6 mg/dL — AB (ref 8.9–10.3)
CO2: 36 mmol/L — ABNORMAL HIGH (ref 22–32)
CREATININE: 0.44 mg/dL (ref 0.44–1.00)
Chloride: 99 mmol/L — ABNORMAL LOW (ref 101–111)
Glucose, Bld: 138 mg/dL — ABNORMAL HIGH (ref 65–99)
Potassium: 4.3 mmol/L (ref 3.5–5.1)
SODIUM: 141 mmol/L (ref 135–145)

## 2017-05-19 LAB — CBC
HCT: 38.3 % (ref 36.0–46.0)
HEMOGLOBIN: 11.7 g/dL — AB (ref 12.0–15.0)
MCH: 30.5 pg (ref 26.0–34.0)
MCHC: 30.5 g/dL (ref 30.0–36.0)
MCV: 100 fL (ref 78.0–100.0)
PLATELETS: 126 10*3/uL — AB (ref 150–400)
RBC: 3.83 MIL/uL — ABNORMAL LOW (ref 3.87–5.11)
RDW: 13.4 % (ref 11.5–15.5)
WBC: 3.4 10*3/uL — ABNORMAL LOW (ref 4.0–10.5)

## 2017-05-19 MED ORDER — SODIUM CHLORIDE 0.9 % IV BOLUS (SEPSIS)
1000.0000 mL | Freq: Once | INTRAVENOUS | Status: AC
  Administered 2017-05-19: 1000 mL via INTRAVENOUS

## 2017-05-19 NOTE — ED Notes (Signed)
Bed: RESA Expected date:  Expected time:  Means of arrival:  Comments: EMS- 60yo F, SOB/Lung CA

## 2017-05-19 NOTE — ED Notes (Signed)
Blood draw attempted x1 unsuccessful

## 2017-05-19 NOTE — Telephone Encounter (Signed)
Phyllis [name] (NP with [facility]) called asking to speak with you regarding the pt. She said that the pts husband is taking her to ER and she wanted to find out what was going on at her last office visit. She can be reached at [phone].

## 2017-05-19 NOTE — Telephone Encounter (Signed)
HH orderd.   Called Enid Derry back and she stated that pt went to ED for falls. She is using the patches and other medications per husband.  ED stated that they were ordering the PT with medication management. No orders entered. Entering the orders for PCP to manage.  Enid Derry also stated that she is calling Oncology to see what the prognosis is of patient to see if the she would in fact be a candidate for hospice. Informed that per the records we could locate she was not actively receiving radiation for treatment but for comfort.

## 2017-05-19 NOTE — ED Notes (Signed)
Pt found to be yelling out "hey, hey."  When this RN entered the room, she asked what she was waiting on.  Pt informed we are waiting on Case Management to come speak to her.  Pt verbalized understanding.  Pt refused to reposition in bed and sts "I just want to go home."

## 2017-05-19 NOTE — Care Management Note (Signed)
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              Action/Plan: CM consulted for HHS.  Spoke with pt and husband who chose [facility].  Contacted rep, Tim, who accepted pt for RN, PT, OT, NA, and SW.  Pt's spouse is requesting help placing pt in ALF or SNF.  Advised the team with [facility] could help with that process.  No further CM needs noted at this time.  Expected Discharge Date:    05/19/17              Expected Discharge Plan:  Home/Self Care ([facility])  Discharge planning Services  CM Consult  Post Acute Care Choice:  Home Health (DME with [facility]; previous HHS with [facility]) Choice offered to:  Patient, Spouse  HH Arranged:  RN, PT, OT, Nurse's Aide, Social Work [employer] Agency:     Status of Service:  Completed, signed off  Corky Crafts, RN 05/19/2017, 2:34 PM

## 2017-05-19 NOTE — ED Notes (Signed)
PT HAS FENTANYL PATCH TO LEFT FOREARM

## 2017-05-19 NOTE — ED Triage Notes (Signed)
Per [facility]- Pt seen recently at [facility]  Pt here for shortness of breath, generalized pain, productive cough ( tan), Lung cancer with METS to brain. Pt given multiple breathing treatments in route. Albuterol 5 mg TX by fire. [facility]. Albuterol 5 atrovent 500 mcg x 2 with some improvement. Pt is home 3 Liters home oxygen. Pt denies fever, N/V/D. Pt lives with husband. Pt with symptoms for 3 months however pt declines transport. EDP Campos present

## 2017-05-19 NOTE — ED Notes (Signed)
Case management at bedside.

## 2017-05-19 NOTE — ED Provider Notes (Signed)
[facility] DEPT Provider Note   CSN: [id number] Arrival date & time: 05/19/17  1026     History   Chief Complaint Chief Complaint  Patient presents with  . Shortness of Breath  . Lung Cancer    MET TO BRAIN  . COPD    HPI Phyllis [name] is a 61 y.o. female.  HPI Patient is a 61 year old female with a history of metastatic lung disease who presents to the emergency department with worsening shortness of breath today.  She was given several breathing treatments in route by EMS and fire and feels much better at this time.  She wears 3 L home oxygen.  Patient is continued decline over the past several months in hospice and palliative care has recently been involved in her care given the advanced nature of her cancer.  She denies fevers and chills.  No unilateral leg swelling.  She states she feels much better at this time.  I spoke with hospice palliative care who reports that she needs additional assistance at home for which she will require some home health assistance as she is currently not hospice given her ongoing treatment with radiation   Past Medical History:  Diagnosis Date  . Anxiety   . Asthma   . CHF (congestive heart failure) ([place])   . Chronic back pain    "in the small of my back and lower back" (01/11/2017)  . Chronic bronchitis ([place])   . Chronic pancreatitis ([place])   . COPD with emphysema ([place])   . DEPRESSION   . Eczema   . Edema 05/20/2010  . GERD 06/06/2009  . Hepatitis C    "took the treatment; I was cured" (01/11/2017)  . High cholesterol   . HYPERTENSION 06/06/2009  . Non-small cell lung cancer (NSCLC) ([place])    NSCLC/squamous cell with bilateral pulmonary nodules treated with stereotactic radiotherapy during the fall of 2017./notes 01/11/2017  . On home oxygen therapy    "3L; 24/7" (01/11/2017)  . OSTEOPOROSIS 06/06/2009  . 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 Pre-diabetes   . Prolonged QT interval 01/07/2017  . Squamous cell  carcinoma 06/15/2016  . TOBACCO USE 06/05/2010  . Unspecified hypothyroidism 06/05/2010  . VITAMIN D DEFICIENCY 06/06/2009    Patient Active Problem List   Diagnosis Date Noted  . Thrombocytopenia ([place]) 04/23/2017  . Cancer-related pain 03/02/2017  . Bipolar disease, chronic ([place]) 02/12/2017  . Chronic diastolic heart failure, NYHA class 1 ([place]) 02/12/2017  . Protein-calorie malnutrition, severe 02/09/2017  . Opiate dependence, continuous ([place]) 02/08/2017  . AKI (acute kidney injury) ([place]) 02/08/2017  . Brain metastasis ([place])   . HLD (hyperlipidemia) 01/05/2017  . Chronic diastolic CHF (congestive heart failure) ([place]) 01/05/2017  . Squamous cancer of left lower lobe of lung ([place]) 06/15/2016  . Type II diabetes mellitus with manifestations ([place]) 04/08/2015  . Pulmonary hypertension due to COPD ([place])   . COPD (chronic obstructive pulmonary disease) with chronic bronchitis ([place]) 09/30/2014  . Visit for screening mammogram 09/30/2014  . Routine general medical examination at a health care facility 09/30/2014  . Hypokalemia 02/21/2014  . Acute renal failure (ARF) ([place]) 03/05/2013  . CAD (coronary artery disease), native coronary artery 03/05/2013  . Right lumbar radiculitis 08/06/2010  . Hypothyroidism 06/05/2010  . TOBACCO USE 06/05/2010  . Hep C w/o coma, chronic ([place]) 06/06/2009  . Depression with anxiety 06/06/2009  . GERD 06/06/2009    Past Surgical History:  Procedure Laterality Date  . APPLICATION OF CRANIAL NAVIGATION  Left 01/19/2017   Procedure: APPLICATION OF CRANIAL NAVIGATION;  Surgeon: Consuella Lose, MD;  Location: [facility];  Service: Neurosurgery;  Laterality: Left;  . BRAIN SURGERY    . BREAST BIOPSY Right ~ 2000   "needle biopsy"   . CARDIAC CATHETERIZATION    . COLONOSCOPY    . DILATION AND CURETTAGE OF UTERUS    . LAPAROSCOPIC CHOLECYSTECTOMY    . LEFT HEART CATHETERIZATION WITH CORONARY ANGIOGRAM N/A 03/08/2013   Procedure: LEFT HEART CATHETERIZATION WITH CORONARY  ANGIOGRAM;  Surgeon: Clent Demark, MD;  Location: [facility] CATH LAB;  Service: Cardiovascular;  Laterality: N/A;  . LUMBAR FUSION    . LUMBAR LAMINECTOMY    . PR DURAL GRAFT REPAIR,SPINE DEFECT Left 01/19/2017   Procedure: LEFT CRANIOTOMY FOR STERIOTACTIC BRAIN BIOPSY;  Surgeon: Consuella Lose, MD;  Location: [facility];  Service: Neurosurgery;  Laterality: Left;  LEFT CRANIOTOMY FOR STERIOTACTIC BRAIN BIOPSY  . TONSILLECTOMY    . TUBAL LIGATION      OB History    No data available       Home Medications    Prior to Admission medications   Medication Sig Start Date End Date Taking? Authorizing Provider  ALPRAZolam (XANAX) 0.5 MG tablet TAKE 1 TABLET BY MOUTH THREE TIMES DAILY AS NEEDED Patient taking differently: TAKE 1 TABLET BY MOUTH THREE TIMES DAILY AS NEEDED FOR ANXIETY 04/05/17  Yes [name], MD  aspirin EC 81 MG tablet Take 81 mg by mouth daily.   Yes [provider]  carvedilol (COREG) 6.25 MG tablet TAKE 1 TABLET(6.25 MG) BY MOUTH TWICE DAILY WITH A MEAL 02/17/17  Yes Hongalgi, Lenis Dickinson, MD  fentaNYL (DURAGESIC - DOSED MCG/HR) 50 MCG/HR Place 1 patch (50 mcg total) onto the skin every 3 (three) days. 05/12/17  Yes [name], MD  gabapentin (NEURONTIN) 300 MG capsule Take 1 capsule (300 mg total) by mouth 2 (two) times daily. 02/17/17  Yes Hongalgi, Lenis Dickinson, MD  magnesium oxide (MAG-OX) 400 MG tablet Take 1 tablet (400 mg total) by mouth 3 (three) times daily after meals. 04/25/17  Yes Aline August, MD  blood glucose meter kit and supplies KIT Dispense based on patient and insurance preference. Use up to four times daily as directed. (FOR ICD-9 250.00, 250.01). 02/17/17   Hongalgi, Lenis Dickinson, MD  Glycopyrrolate [facility] REFILL KIT) 25 MCG/ML SOLN Inhale 1 Act into the lungs 2 (two) times daily. 05/13/17   [name], MD  potassium chloride SA (K-DUR,KLOR-CON) 20 MEQ tablet Take 1 tablet (20 mEq total) by mouth 2 (two) times daily. Patient not taking: Reported  on 05/19/2017 05/10/17 05/13/17  Duffy Bruce, MD    Family History Family History  Problem Relation Age of Onset  . COPD Father   . Hypertension Mother   . Kidney disease Mother   . Colon cancer Neg Hx   . Stomach cancer Neg Hx     Social History Social History  Substance Use Topics  . Smoking status: Former Smoker    Packs/day: 0.10    Years: 35.00    Types: Cigarettes    Quit date: 04/03/2015  . Smokeless tobacco: Never Used  . Alcohol use No     Allergies   Amlodipine and Meperidine hcl   Review of Systems Review of Systems  All other systems reviewed and are negative.    Physical Exam Updated Vital Signs BP 118/87   Pulse 85   Temp 97.9 F (36.6 C) (Oral)  Resp (!) 27   Ht _0  (1.651 m)   Wt 54.4 kg (120 lb)   LMP 10/05/1999   SpO2 (S) 100% Comment: ADDITION TX BT [facility] 2N TX ALBUTEROL ATROVENT 5MG 500MCG 2 TREATMENTS IN ROUTE  BMI 19.97 kg/m   Physical Exam  Constitutional: She is oriented to person, place, and time. She appears well-developed and well-nourished. No distress.  HENT:  Head: Normocephalic and atraumatic.  Eyes: EOM are normal.  Neck: Normal range of motion.  Cardiovascular: Normal rate, regular rhythm and normal heart sounds.   Pulmonary/Chest: Effort normal and breath sounds normal.  Abdominal: Soft. She exhibits no distension. There is no tenderness.  Musculoskeletal: Normal range of motion.  Neurological: She is alert and oriented to person, place, and time.  Skin: Skin is warm and dry.  Psychiatric: She has a normal mood and affect. Judgment normal.  Nursing note and vitals reviewed.    ED Treatments / Results  Labs (all labs ordered are listed, but only abnormal results are displayed) Labs Reviewed  CBC - Abnormal; Notable for the following:       Result Value   WBC 3.4 (*)    RBC 3.83 (*)    Hemoglobin 11.7 (*)    Platelets 126 (*)    All other components within normal limits  BASIC METABOLIC PANEL - Abnormal;  Notable for the following:    Chloride 99 (*)    CO2 36 (*)    Glucose, Bld 138 (*)    Calcium 8.6 (*)    All other components within normal limits    EKG  EKG Interpretation None       Radiology Dg Chest 2 View  Result Date: 05/19/2017 CLINICAL DATA:  Increased cough.  History of metastatic lung cancer. EXAM: CHEST  2 VIEW COMPARISON:  05/10/2017 chest x-ray 04/24/2017 CT chest FINDINGS: Bilateral emphysematous changes. Bilateral diffuse lower lung chronic interstitial thickening with more patchy left lower lobe airspace disease which may reflect fibrosis. No new focal consolidation. No pleural effusion or pneumothorax. Stable cardiomediastinal silhouette. No acute osseous abnormality. IMPRESSION: Accounting for differences in technique, no interval change compared with 05/10/2017. No acute cardiopulmonary disease. Electronically Signed   By: Kathreen Devoid   On: 05/19/2017 12:05   Ct Head Wo Contrast  Result Date: 05/19/2017 CLINICAL DATA:  Ataxia EXAM: CT HEAD WITHOUT CONTRAST TECHNIQUE: Contiguous axial images were obtained from the base of the skull through the vertex without intravenous contrast. COMPARISON:  Brain MRI 04/25/2017 FINDINGS: Brain: No evidence of acute infarction, hemorrhage, hydrocephalus, extra-axial collection. No abnormality detected in the cerebellum or brainstem. Low-density near the left posterior corpus callosum is a treated brain metastasis. No mass effect or hemorrhage. Chronic white matter disease, mild. Vascular: Atherosclerotic calcification.  No hyperdense vessel. Skull: No acute or aggressive finding.Burr-hole for biopsy in the left parietal bone. Sinuses/Orbits: Negative IMPRESSION: 1. No acute finding. 2. Treated brain metastasis without mass effect/swelling. Electronically Signed   By: Monte Fantasia M.D.   On: 05/19/2017 12:25    Procedures Procedures (including critical care time)  Medications Ordered in ED Medications  sodium chloride 0.9 % bolus  1,000 mL (1,000 mLs Intravenous New Bag/Given 05/19/17 1303)     Initial Impression / Assessment and Plan / ED Course  I have reviewed the triage vital signs and the nursing notes.  Pertinent labs & imaging results that were available during my care of the patient were reviewed by me and considered in my medical decision making (see  chart for details).     Patient feels much better time of discharge.  Her breathing is returned to baseline.  She is ready to go home.  I spoke with case management who will help arrange additional assistance at home with a home health agency.  Please see case management consultation note for complete details.  Patient and husband understand to return to the ER for new or worsening symptoms.  Close follow-up with her oncologist.  Vital signs stable.  Patient is requesting discharge home  Final Clinical Impressions(s) / ED Diagnoses   Final diagnoses:  Metastatic cancer [facility])    New Prescriptions New Prescriptions   No medications on file     Jola Schmidt, MD 05/20/17 [phone]

## 2017-05-20 ENCOUNTER — Telehealth: Payer: Self-pay | Admitting: *Deleted

## 2017-05-20 NOTE — Telephone Encounter (Signed)
"  Phyllis [name] Case [id number]).  Palliative Care need advise on prognosis and future care or treatment plans for this patient.  Need to obtain C.N.A., home care help verses full Hospice care.  Scheduled F/U 05-25-2017.  Spouse communicating need for more help/support in the home or she needs to go to receive help.  Terrible appetite except for applesauce and fruit.  Five to six ED visits over last two weeks.  Not taking medications as she should but using a lot.  She will not use a pill box or allow him to help.  Dr. Ronnald Ramp changed to fentanyl patch.  Discussed overdose risks, warnings with report she is purchasing medications elsewhere after finishing our ordered medications.  pleae call with update."

## 2017-05-20 NOTE — Telephone Encounter (Signed)
Oncology Nurse Navigator Documentation  Oncology Nurse Navigator Flowsheets 05/20/2017  Navigator Location [facility]  Navigator Encounter Type Telephone/I called Phyllis [name] and updated her she did not need labs before her scan on 05/23/17.  She had labs on 05/19/17.  Phyllis [name] verbalized understanding no labs just CT scan. She was thankful for the call.   Telephone Outgoing Call  Treatment Phase Follow-up  Barriers/Navigation Needs Coordination of Care  Interventions Coordination of Care  Coordination of Care Other  Acuity Level 1  Time Spent with Patient 15

## 2017-05-23 ENCOUNTER — Ambulatory Visit (HOSPITAL_COMMUNITY)
Admission: RE | Admit: 2017-05-23 | Discharge: 2017-05-23 | Disposition: A | Discharge status: Home / Self Care | Payer: Medicare Other | Source: Ambulatory Visit | Attending: Internal Medicine | Admitting: Internal Medicine

## 2017-05-23 ENCOUNTER — Other Ambulatory Visit: Payer: Self-pay

## 2017-05-23 ENCOUNTER — Encounter (HOSPITAL_COMMUNITY): Payer: Self-pay

## 2017-05-23 ENCOUNTER — Telehealth: Payer: Self-pay | Admitting: Internal Medicine

## 2017-05-23 DIAGNOSIS — C3432 Malignant neoplasm of lower lobe, left bronchus or lung: Secondary | ICD-10-CM

## 2017-05-23 DIAGNOSIS — C3431 Malignant neoplasm of lower lobe, right bronchus or lung: Secondary | ICD-10-CM

## 2017-05-23 NOTE — Telephone Encounter (Signed)
FYI Callingto let Dr. Ronnald Ramp know they received the referral for physical  therapy and physical therapy will be starting tomorrow

## 2017-05-23 NOTE — Telephone Encounter (Signed)
Phyllis [name] will address at appt tomorrow.

## 2017-05-25 ENCOUNTER — Ambulatory Visit: Payer: Self-pay | Admitting: Internal Medicine

## 2017-05-25 ENCOUNTER — Telehealth: Payer: Self-pay

## 2017-05-25 DIAGNOSIS — R531 Weakness: Secondary | ICD-10-CM | POA: Diagnosis not present

## 2017-05-25 NOTE — Telephone Encounter (Signed)
S/w Diane RN and called Gonzella Lex NP back and lvm. Instructed her that today's appt at [facility] was to review the CT and since that was not done we will cancel the appt at [facility]. Enid Derry can contact Dr Ronnald Ramp for the other pt care issues b/c he initiated hospice.

## 2017-05-25 NOTE — Telephone Encounter (Signed)
Enid Derry, NP with Hospice called and stated that pt has agreed to accept hospice care.   Enid Derry, NP requested verbal hospice referral. Verbal hospice referral was given.

## 2017-05-25 NOTE — Telephone Encounter (Signed)
Enid Derry NP with hospice is calling to find out how best hospice can help pt. Pt had declined hospice early august. Therefore palliative is doing a follow up to determine eligibility and pt acceptance of palliative or hospice. Her oncology f/u is 8/22, she was supposed to have CT chest on Monday 8/20 it was cancelled b/c she got sick at her appt and sent home. Her rad onc plan is MRI brain in October.  Enid Derry reports pt is confused, oriented to place but not date, day or month. She is pacing and tremulous in the house. She was given an rx for 2 boxes fentanyl patches by Dr Ronnald Ramp on 8/9 and has used them up. She is only eating jello or crackers. She has some diarrhea but no blood as reported by her husband. She has been to the ED frequently for pain, most recent last Thursday.  Discussion that Dr Ronnald Ramp was initiator of hospice referal and shirley may call him for further clarification.  Enid Derry will call Dr Ronnald Ramp, encourage pt to keep today's appt, or possibly have pt admitted.

## 2017-05-25 NOTE — Telephone Encounter (Signed)
Darlina Guys called back and there RN will be starting PT on 8/24

## 2017-05-26 ENCOUNTER — Telehealth: Payer: Self-pay | Admitting: Internal Medicine

## 2017-05-26 ENCOUNTER — Other Ambulatory Visit: Payer: Self-pay | Admitting: Internal Medicine

## 2017-05-26 DIAGNOSIS — F418 Other specified anxiety disorders: Secondary | ICD-10-CM

## 2017-05-26 DIAGNOSIS — G893 Neoplasm related pain (acute) (chronic): Secondary | ICD-10-CM

## 2017-05-26 MED ORDER — ALPRAZOLAM 0.5 MG PO TABS: 0.5000 mg | ORAL_TABLET | Freq: Three times a day (TID) | ORAL | 2 refills | Status: AC | PRN

## 2017-05-26 MED ORDER — FENTANYL 50 MCG/HR TD PT72: 50.0000 ug | MEDICATED_PATCH | TRANSDERMAL | 0 refills | Status: AC

## 2017-05-26 NOTE — Telephone Encounter (Signed)
Notified pt rx's ready for pick-up.Marland KitchenJohny [name]

## 2017-05-26 NOTE — Telephone Encounter (Signed)
RX;s written

## 2017-05-26 NOTE — Telephone Encounter (Signed)
Pt called In and wants a refill on her ALPRAZolam (XANAX) 0.5 MG tablet [[id number]] And fentaNYL ([place] - DOSED MCG/HR) 50 MCG/HR [[id number]]

## 2017-05-26 NOTE — Telephone Encounter (Signed)
Check [place] registry to see when last filled there was no history on meds. Last UDS 08/18/16...Johny Chess

## 2017-05-27 NOTE — Telephone Encounter (Signed)
Pt said that she took these to the pharmacy to have filled and her insurance will no cover them until Monday. She wanted to know if something could be prescribed to get her through until Monday because she is in so much pain. I let her know that Dr Ronnald Ramp is not in the office today so I was not sure if something could be done or not.

## 2017-05-29 ENCOUNTER — Emergency Department (HOSPITAL_COMMUNITY): Payer: Medicare Other

## 2017-05-29 ENCOUNTER — Encounter (HOSPITAL_COMMUNITY): Payer: Self-pay | Admitting: Emergency Medicine

## 2017-05-29 ENCOUNTER — Inpatient Hospital Stay (HOSPITAL_COMMUNITY)
Admission: EM | Admit: 2017-05-29 | Discharge: 2017-06-06 | DRG: 682 | Disposition: A | Discharge status: Home Health | Payer: Medicare Other | Attending: Internal Medicine | Admitting: Internal Medicine

## 2017-05-29 DIAGNOSIS — E039 Hypothyroidism, unspecified: Secondary | ICD-10-CM | POA: Diagnosis present

## 2017-05-29 DIAGNOSIS — I5032 Chronic diastolic (congestive) heart failure: Secondary | ICD-10-CM | POA: Diagnosis not present

## 2017-05-29 DIAGNOSIS — C7931 Secondary malignant neoplasm of brain: Secondary | ICD-10-CM | POA: Diagnosis present

## 2017-05-29 DIAGNOSIS — Z981 Arthrodesis status: Secondary | ICD-10-CM

## 2017-05-29 DIAGNOSIS — E876 Hypokalemia: Secondary | ICD-10-CM | POA: Diagnosis not present

## 2017-05-29 DIAGNOSIS — E872 Acidosis, unspecified: Secondary | ICD-10-CM

## 2017-05-29 DIAGNOSIS — G8929 Other chronic pain: Secondary | ICD-10-CM | POA: Diagnosis present

## 2017-05-29 DIAGNOSIS — M810 Age-related osteoporosis without current pathological fracture: Secondary | ICD-10-CM | POA: Diagnosis not present

## 2017-05-29 DIAGNOSIS — R079 Chest pain, unspecified: Secondary | ICD-10-CM | POA: Diagnosis not present

## 2017-05-29 DIAGNOSIS — E871 Hypo-osmolality and hyponatremia: Secondary | ICD-10-CM

## 2017-05-29 DIAGNOSIS — K861 Other chronic pancreatitis: Secondary | ICD-10-CM | POA: Diagnosis present

## 2017-05-29 DIAGNOSIS — I272 Pulmonary hypertension, unspecified: Secondary | ICD-10-CM | POA: Diagnosis present

## 2017-05-29 DIAGNOSIS — E118 Type 2 diabetes mellitus with unspecified complications: Secondary | ICD-10-CM | POA: Diagnosis present

## 2017-05-29 DIAGNOSIS — E440 Moderate protein-calorie malnutrition: Secondary | ICD-10-CM | POA: Insufficient documentation

## 2017-05-29 DIAGNOSIS — I2723 Pulmonary hypertension due to lung diseases and hypoxia: Secondary | ICD-10-CM | POA: Diagnosis present

## 2017-05-29 DIAGNOSIS — R109 Unspecified abdominal pain: Secondary | ICD-10-CM | POA: Diagnosis not present

## 2017-05-29 DIAGNOSIS — Z87891 Personal history of nicotine dependence: Secondary | ICD-10-CM

## 2017-05-29 DIAGNOSIS — E1165 Type 2 diabetes mellitus with hyperglycemia: Secondary | ICD-10-CM | POA: Diagnosis not present

## 2017-05-29 DIAGNOSIS — T380X5A Adverse effect of glucocorticoids and synthetic analogues, initial encounter: Secondary | ICD-10-CM | POA: Diagnosis not present

## 2017-05-29 DIAGNOSIS — D899 Disorder involving the immune mechanism, unspecified: Secondary | ICD-10-CM | POA: Diagnosis present

## 2017-05-29 DIAGNOSIS — F3189 Other bipolar disorder: Secondary | ICD-10-CM | POA: Diagnosis present

## 2017-05-29 DIAGNOSIS — Z85118 Personal history of other malignant neoplasm of bronchus and lung: Secondary | ICD-10-CM

## 2017-05-29 DIAGNOSIS — J9601 Acute respiratory failure with hypoxia: Secondary | ICD-10-CM | POA: Diagnosis not present

## 2017-05-29 DIAGNOSIS — I2510 Atherosclerotic heart disease of native coronary artery without angina pectoris: Secondary | ICD-10-CM | POA: Diagnosis present

## 2017-05-29 DIAGNOSIS — F1120 Opioid dependence, uncomplicated: Secondary | ICD-10-CM | POA: Diagnosis present

## 2017-05-29 DIAGNOSIS — K219 Gastro-esophageal reflux disease without esophagitis: Secondary | ICD-10-CM | POA: Diagnosis not present

## 2017-05-29 DIAGNOSIS — I110 Hypertensive heart disease with heart failure: Secondary | ICD-10-CM | POA: Diagnosis present

## 2017-05-29 DIAGNOSIS — C3432 Malignant neoplasm of lower lobe, left bronchus or lung: Secondary | ICD-10-CM | POA: Diagnosis present

## 2017-05-29 DIAGNOSIS — R339 Retention of urine, unspecified: Secondary | ICD-10-CM | POA: Diagnosis present

## 2017-05-29 DIAGNOSIS — F418 Other specified anxiety disorders: Secondary | ICD-10-CM | POA: Diagnosis present

## 2017-05-29 DIAGNOSIS — K7460 Unspecified cirrhosis of liver: Secondary | ICD-10-CM | POA: Diagnosis present

## 2017-05-29 DIAGNOSIS — N179 Acute kidney failure, unspecified: Secondary | ICD-10-CM | POA: Diagnosis not present

## 2017-05-29 DIAGNOSIS — Z9981 Dependence on supplemental oxygen: Secondary | ICD-10-CM

## 2017-05-29 DIAGNOSIS — Z8249 Family history of ischemic heart disease and other diseases of the circulatory system: Secondary | ICD-10-CM

## 2017-05-29 DIAGNOSIS — Z825 Family history of asthma and other chronic lower respiratory diseases: Secondary | ICD-10-CM

## 2017-05-29 DIAGNOSIS — D696 Thrombocytopenia, unspecified: Secondary | ICD-10-CM | POA: Diagnosis present

## 2017-05-29 DIAGNOSIS — Z79899 Other long term (current) drug therapy: Secondary | ICD-10-CM

## 2017-05-29 DIAGNOSIS — T502X5A Adverse effect of carbonic-anhydrase inhibitors, benzothiadiazides and other diuretics, initial encounter: Secondary | ICD-10-CM | POA: Diagnosis not present

## 2017-05-29 DIAGNOSIS — J449 Chronic obstructive pulmonary disease, unspecified: Secondary | ICD-10-CM | POA: Diagnosis present

## 2017-05-29 DIAGNOSIS — Z923 Personal history of irradiation: Secondary | ICD-10-CM

## 2017-05-29 DIAGNOSIS — Z9049 Acquired absence of other specified parts of digestive tract: Secondary | ICD-10-CM

## 2017-05-29 DIAGNOSIS — Z841 Family history of disorders of kidney and ureter: Secondary | ICD-10-CM

## 2017-05-29 DIAGNOSIS — B182 Chronic viral hepatitis C: Secondary | ICD-10-CM | POA: Diagnosis present

## 2017-05-29 DIAGNOSIS — E1142 Type 2 diabetes mellitus with diabetic polyneuropathy: Secondary | ICD-10-CM | POA: Diagnosis not present

## 2017-05-29 DIAGNOSIS — Z7982 Long term (current) use of aspirin: Secondary | ICD-10-CM

## 2017-05-29 DIAGNOSIS — Z888 Allergy status to other drugs, medicaments and biological substances status: Secondary | ICD-10-CM

## 2017-05-29 DIAGNOSIS — Z886 Allergy status to analgesic agent status: Secondary | ICD-10-CM

## 2017-05-29 DIAGNOSIS — R0602 Shortness of breath: Secondary | ICD-10-CM

## 2017-05-29 LAB — COMPREHENSIVE METABOLIC PANEL
ALBUMIN: 4.5 g/dL (ref 3.5–5.0)
ALT: 11 U/L — AB (ref 14–54)
AST: 23 U/L (ref 15–41)
Alkaline Phosphatase: 80 U/L (ref 38–126)
Anion gap: 17 — ABNORMAL HIGH (ref 5–15)
BILIRUBIN TOTAL: 0.9 mg/dL (ref 0.3–1.2)
BUN: 37 mg/dL — AB (ref 6–20)
CHLORIDE: 96 mmol/L — AB (ref 101–111)
CO2: 17 mmol/L — ABNORMAL LOW (ref 22–32)
CREATININE: 1.48 mg/dL — AB (ref 0.44–1.00)
Calcium: 8.1 mg/dL — ABNORMAL LOW (ref 8.9–10.3)
GFR calc Af Amer: 43 mL/min — ABNORMAL LOW (ref >60)
GFR calc non Af Amer: 37 mL/min — ABNORMAL LOW (ref >60)
GLUCOSE: 107 mg/dL — AB (ref 65–99)
POTASSIUM: 2.8 mmol/L — AB (ref 3.5–5.1)
Sodium: 130 mmol/L — ABNORMAL LOW (ref 135–145)
Total Protein: 7.9 g/dL (ref 6.5–8.1)

## 2017-05-29 LAB — CBC WITH DIFFERENTIAL/PLATELET
Basophils Absolute: 0 10*3/uL (ref 0.0–0.1)
Basophils Relative: 0 %
EOS PCT: 0 %
Eosinophils Absolute: 0 10*3/uL (ref 0.0–0.7)
HCT: 48 % — ABNORMAL HIGH (ref 36.0–46.0)
Hemoglobin: 17.5 g/dL — ABNORMAL HIGH (ref 12.0–15.0)
LYMPHS ABS: 1.9 10*3/uL (ref 0.7–4.0)
LYMPHS PCT: 21 %
MCH: 31.3 pg (ref 26.0–34.0)
MCHC: 36.5 g/dL — AB (ref 30.0–36.0)
MCV: 85.9 fL (ref 78.0–100.0)
MONO ABS: 0.7 10*3/uL (ref 0.1–1.0)
Monocytes Relative: 8 %
Neutro Abs: 6.2 10*3/uL (ref 1.7–7.7)
Neutrophils Relative %: 71 %
PLATELETS: 225 10*3/uL (ref 150–400)
RBC: 5.59 MIL/uL — ABNORMAL HIGH (ref 3.87–5.11)
RDW: 13.5 % (ref 11.5–15.5)
WBC: 8.8 10*3/uL (ref 4.0–10.5)

## 2017-05-29 LAB — URINALYSIS, ROUTINE W REFLEX MICROSCOPIC
Bilirubin Urine: NEGATIVE
Glucose, UA: NEGATIVE mg/dL
Hgb urine dipstick: NEGATIVE
KETONES UR: NEGATIVE mg/dL
Nitrite: NEGATIVE
PH: 6 (ref 5.0–8.0)
PROTEIN: NEGATIVE mg/dL
Specific Gravity, Urine: 1.01 (ref 1.005–1.030)

## 2017-05-29 LAB — MAGNESIUM: Magnesium: 1.8 mg/dL (ref 1.7–2.4)

## 2017-05-29 LAB — I-STAT CG4 LACTIC ACID, ED: Lactic Acid, Venous: 2.96 mmol/L (ref 0.5–1.9)

## 2017-05-29 MED ORDER — MORPHINE SULFATE (PF) 4 MG/ML IV SOLN
4.0000 mg | Freq: Once | INTRAVENOUS | Status: AC
  Administered 2017-05-29: 4 mg via INTRAVENOUS
  Filled 2017-05-29: qty 1

## 2017-05-29 MED ORDER — FENTANYL CITRATE (PF) 100 MCG/2ML IJ SOLN
50.0000 ug | Freq: Once | INTRAMUSCULAR | Status: AC
  Administered 2017-05-29: 50 ug via INTRAVENOUS
  Filled 2017-05-29: qty 2

## 2017-05-29 MED ORDER — SODIUM CHLORIDE 0.9 % IV BOLUS (SEPSIS)
1000.0000 mL | Freq: Once | INTRAVENOUS | Status: AC
  Administered 2017-05-29: 1000 mL via INTRAVENOUS

## 2017-05-29 MED ORDER — ONDANSETRON HCL 4 MG/2ML IJ SOLN
4.0000 mg | Freq: Once | INTRAMUSCULAR | Status: AC
  Administered 2017-05-29: 4 mg via INTRAVENOUS
  Filled 2017-05-29: qty 2

## 2017-05-29 MED ORDER — POTASSIUM CHLORIDE CRYS ER 20 MEQ PO TBCR
40.0000 meq | EXTENDED_RELEASE_TABLET | Freq: Once | ORAL | Status: AC
  Administered 2017-05-29: 40 meq via ORAL
  Filled 2017-05-29: qty 2

## 2017-05-29 NOTE — ED Provider Notes (Signed)
[facility] DEPT Provider Note   CSN: [id number] Arrival date & time: 05/29/17  1303     History   Chief Complaint Chief Complaint  Patient presents with  . Flank Pain  . Back Pain    chronic     HPI Phyllis [name] is a 61 y.o. 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 Patient states pain is intermittently sharp, sometimes made worse with movement. Denies recent injury. Reports associated urinary urgency and intermittent nausea. She denies worsening shortness of breath from baseline, chest pain, fever, vomiting/diarrhea, or any other symptoms today. Per chart review, patient with home health, and has been set up with hospice care. Patient with pain management care per Dr. Ronnald Ramp, who recently prescribed 50 g fentanyl patches and 05/12/2017, however states she ran out of this medication. Prescription was for 1 patch every 3 days, therefore patient has been overusing medication.  Per chart review, pt on radiation for comfort.    The history is provided by the patient.    Past Medical History:  Diagnosis Date  . Anxiety   . Asthma   . CHF (congestive heart failure) ([place])   . Chronic back pain    "in the small of my back and lower back" (01/11/2017)  . Chronic bronchitis ([place])   . Chronic pancreatitis ([place])   . COPD with emphysema ([place])   . DEPRESSION   . Eczema   . Edema 05/20/2010  . GERD 06/06/2009  . Hepatitis C    "took the treatment; I was cured" (01/11/2017)  . High cholesterol   . HYPERTENSION 06/06/2009  . Non-small cell lung cancer (NSCLC) ([place])    NSCLC/squamous cell with bilateral pulmonary nodules treated with stereotactic radiotherapy during the fall of 2017./notes 01/11/2017  . On home oxygen therapy    "3L; 24/7" (01/11/2017)  . OSTEOPOROSIS  06/06/2009  . 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 Pre-diabetes   . Prolonged QT interval 01/07/2017  . Squamous cell carcinoma 06/15/2016  . TOBACCO USE 06/05/2010  . Unspecified hypothyroidism 06/05/2010  . VITAMIN D DEFICIENCY 06/06/2009    Patient Active Problem List   Diagnosis Date Noted  . Thrombocytopenia ([place]) 04/23/2017  . Cancer-related pain 03/02/2017  . Bipolar disease, chronic ([place]) 02/12/2017  . Chronic diastolic heart failure, NYHA class 1 ([place]) 02/12/2017  . Protein-calorie malnutrition, severe 02/09/2017  . Opiate dependence, continuous ([place]) 02/08/2017  . AKI (acute kidney injury) ([place]) 02/08/2017  . Brain metastasis ([place])   . HLD (hyperlipidemia) 01/05/2017  . Chronic diastolic CHF (congestive heart failure) ([place]) 01/05/2017  . Squamous cancer of left lower lobe of lung ([place]) 06/15/2016  . Type II diabetes mellitus with manifestations ([place]) 04/08/2015  . Pulmonary hypertension due to COPD ([place])   . COPD (chronic obstructive pulmonary disease) with chronic bronchitis ([place]) 09/30/2014  . Visit for screening mammogram 09/30/2014  . Routine general medical examination at a health care facility 09/30/2014  . Hypokalemia 02/21/2014  . Acute renal failure (ARF) ([place]) 03/05/2013  . CAD (coronary artery disease), native coronary artery 03/05/2013  . Right lumbar radiculitis 08/06/2010  . Hypothyroidism 06/05/2010  . TOBACCO USE 06/05/2010  . Hep C w/o coma, chronic ([place]) 06/06/2009  . Depression with anxiety 06/06/2009  . GERD 06/06/2009    Past Surgical History:  Procedure Laterality  Date  . APPLICATION OF CRANIAL NAVIGATION Left 01/19/2017   Procedure: APPLICATION OF CRANIAL NAVIGATION;  Surgeon: Consuella Lose, MD;  Location: [facility];  Service: Neurosurgery;  Laterality: Left;  . BRAIN SURGERY    . BREAST BIOPSY Right ~ 2000   "needle biopsy"   . CARDIAC CATHETERIZATION    . COLONOSCOPY    . DILATION AND CURETTAGE OF UTERUS    .  LAPAROSCOPIC CHOLECYSTECTOMY    . LEFT HEART CATHETERIZATION WITH CORONARY ANGIOGRAM N/A 03/08/2013   Procedure: LEFT HEART CATHETERIZATION WITH CORONARY ANGIOGRAM;  Surgeon: Clent Demark, MD;  Location: [facility] CATH LAB;  Service: Cardiovascular;  Laterality: N/A;  . LUMBAR FUSION    . LUMBAR LAMINECTOMY    . PR DURAL GRAFT REPAIR,SPINE DEFECT Left 01/19/2017   Procedure: LEFT CRANIOTOMY FOR STERIOTACTIC BRAIN BIOPSY;  Surgeon: Consuella Lose, MD;  Location: [facility];  Service: Neurosurgery;  Laterality: Left;  LEFT CRANIOTOMY FOR STERIOTACTIC BRAIN BIOPSY  . TONSILLECTOMY    . TUBAL LIGATION      OB History    No data available       Home Medications    Prior to Admission medications   Medication Sig Start Date End Date Taking? Authorizing Provider  ALPRAZolam Duanne Moron) 0.5 MG tablet Take 1 tablet (0.5 mg total) by mouth 3 (three) times daily as needed. for anxiety 05/26/17  Yes [name], MD  aspirin EC 81 MG tablet Take 81 mg by mouth daily.   Yes [provider]  blood glucose meter kit and supplies KIT Dispense based on patient and insurance preference. Use up to four times daily as directed. (FOR ICD-9 250.00, 250.01). 02/17/17  Yes Hongalgi, Lenis Dickinson, MD  fentaNYL (DURAGESIC - DOSED MCG/HR) 50 MCG/HR Place 1 patch (50 mcg total) onto the skin every 3 (three) days. 05/26/17  Yes [name], MD  gabapentin (NEURONTIN) 300 MG capsule Take 1 capsule (300 mg total) by mouth 2 (two) times daily. 02/17/17  Yes Hongalgi, Lenis Dickinson, MD  Glycopyrrolate [facility] REFILL KIT) 25 MCG/ML SOLN Inhale 1 Act into the lungs 2 (two) times daily. 05/13/17  Yes [name], MD  magnesium oxide (MAG-OX) 400 MG tablet Take 1 tablet (400 mg total) by mouth 3 (three) times daily after meals. 04/25/17  Yes Aline August, MD  Oxycodone HCl 10 MG TABS Take 5 mg by mouth 3 (three) times daily as needed for severe pain. 05/26/17  Yes [provider]  carvedilol (COREG) 6.25 MG tablet  TAKE 1 TABLET(6.25 MG) BY MOUTH TWICE DAILY WITH A MEAL 02/17/17   Hongalgi, Lenis Dickinson, MD  potassium chloride SA (K-DUR,KLOR-CON) 20 MEQ tablet Take 1 tablet (20 mEq total) by mouth 2 (two) times daily. Patient not taking: Reported on 05/19/2017 05/10/17 05/13/17  Duffy Bruce, MD  triamterene-hydrochlorothiazide (MAXZIDE-25) 37.5-25 MG tablet Take 1 tablet by mouth daily.    [provider]    Family History Family History  Problem Relation Age of Onset  . COPD Father   . Hypertension Mother   . Kidney disease Mother   . Colon cancer Neg Hx   . Stomach cancer Neg Hx     Social History Social History  Substance Use Topics  . Smoking status: Former Smoker    Packs/day: 0.10    Years: 35.00    Types: Cigarettes    Quit date: 04/03/2015  . Smokeless tobacco: Never Used  . Alcohol use No     Allergies   Amlodipine;  Meperidine hcl; and Naproxen   Review of Systems Review of Systems  Constitutional: Negative for chills and fever.  HENT: Negative.   Eyes: Negative.   Respiratory: Negative for shortness of breath.   Cardiovascular: Negative for chest pain.  Gastrointestinal: Positive for nausea. Negative for abdominal pain, constipation, diarrhea and vomiting.  Genitourinary: Positive for flank pain and urgency. Negative for dysuria.  Musculoskeletal: Back pain: chronic.  Skin: Negative for color change.  Allergic/Immunologic: Positive for immunocompromised state.  Neurological: Negative for numbness.     Physical Exam Updated Vital Signs BP (!) 172/89 (BP Location: Left Arm)   Pulse 86   Temp 98.4 F (36.9 C) (Oral)   Resp 18   LMP 10/05/1999   SpO2 95%   Physical Exam  Constitutional: She is oriented to person, place, and time. She appears well-developed and well-nourished. No distress.  Chronically ill-appearing  HENT:  Head: Normocephalic and atraumatic.  Mouth/Throat: Oropharynx is clear and moist.  Eyes: Pupils are equal, round, and reactive to  light. Conjunctivae and EOM are normal.  Neck: Normal range of motion. Neck supple.  Cardiovascular: Normal rate, regular rhythm and intact distal pulses.   Pulmonary/Chest: Effort normal. No respiratory distress. She has no wheezes. She has no rales. She exhibits no tenderness.  b/l rhonchi  Abdominal: Soft. Bowel sounds are normal. She exhibits no distension and no mass. There is no tenderness. There is no rebound and no guarding. No hernia.  No CVA tenderness.  Musculoskeletal:  No spinal or paraspinal tenderness. No bony step-offs. No gross deformities.  Neurological: She is alert and oriented to person, place, and time.  Oriented to person place and time.  Skin: Skin is warm.  Psychiatric: She has a normal mood and affect. Her behavior is normal.  Nursing note and vitals reviewed.    ED Treatments / Results  Labs (all labs ordered are listed, but only abnormal results are displayed) Labs Reviewed  URINALYSIS, ROUTINE W REFLEX MICROSCOPIC - Abnormal; Notable for the following:       Result Value   Leukocytes, UA TRACE (*)    Bacteria, UA RARE (*)    Squamous Epithelial / LPF 6-30 (*)    All other components within normal limits  COMPREHENSIVE METABOLIC PANEL - Abnormal; Notable for the following:    Sodium 130 (*)    Potassium 2.8 (*)    Chloride 96 (*)    CO2 17 (*)    Glucose, Bld 107 (*)    BUN 37 (*)    Creatinine, Ser 1.48 (*)    Calcium 8.1 (*)    ALT 11 (*)    GFR calc non Af Amer 37 (*)    GFR calc Af Amer 43 (*)    Anion gap 17 (*)    All other components within normal limits  CBC WITH DIFFERENTIAL/PLATELET - Abnormal; Notable for the following:    RBC 5.59 (*)    Hemoglobin 17.5 (*)    HCT 48.0 (*)    MCHC 36.5 (*)    All other components within normal limits  I-STAT CG4 LACTIC ACID, ED - Abnormal; Notable for the following:    Lactic Acid, Venous 2.96 (*)    All other components within normal limits  MAGNESIUM    EKG  EKG Interpretation None         Radiology Dg Chest 2 View  Result Date: 05/29/2017 CLINICAL DATA:  Chest pain for 5 days. Personal history of lung carcinoma. COPD. EXAM: CHEST  2 VIEW COMPARISON:  05/19/2017 FINDINGS: The heart size and mediastinal contours are within normal limits. Aortic atherosclerosis. Severe emphysema again demonstrated as well as bibasilar scarring. No evidence of pulmonary infiltrate or edema. No evidence of pneumothorax or pleural effusion. The visualized skeletal structures are unremarkable. Diffuse pancreatic calcification again seen, consistent with chronic pancreatitis. IMPRESSION: Emphysema and bibasilar scarring.  No active lung disease. Electronically Signed   By: Earle Gell M.D.   On: 05/29/2017 15:46   Ct Renal Stone Study  Result Date: 05/29/2017 CLINICAL DATA:  Chronic back pain with new LEFT flank pain for 3 days. Suspect kidney stones. History of lung cancer. EXAM: CT ABDOMEN AND PELVIS WITHOUT CONTRAST TECHNIQUE: Multidetector CT imaging of the abdomen and pelvis was performed following the standard protocol without IV contrast. COMPARISON:  Abdominal radiograph Feb 16, 2017 and PET-CT May 14, 2016 and CT chest May 27, 2016 FINDINGS: LOWER CHEST: Centrilobular emphysema. LEFT lower lobe bronchiectasis with unchanged mucoid impaction. Smaller approximate 18 mm consolidation LEFT lower lobe at site of prior biopsy. No pleural effusion. Bilateral small fat containing diaphragmatic hernias. Heart size is normal. No pericardial effusions. Mild gas distended distal esophagus associated with reflux disease. HEPATOBILIARY: Nodular liver contour consistent with cirrhosis. Noncontrast CT may be insensitive for small tumor. Status post cholecystectomy. PANCREAS: Diffusely calcified pancreas compatible chronic pancreatitis. No acute component. SPLEEN: Normal. ADRENALS/URINARY TRACT: Kidneys are orthotopic, demonstrating normal size and morphology. No nephrolithiasis, hydronephrosis; limited assessment  for renal masses on this nonenhanced examination. 2.3 cm RIGHT lower pole cyst, homogeneously hypodense and hypometabolic by prior PET-CT. The unopacified ureters are normal in course and caliber. Urinary bladder is well distended and unremarkable. Normal adrenal glands. STOMACH/BOWEL: The stomach, small and large bowel are normal in course and caliber without inflammatory changes, sensitivity decreased by lack of enteric contrast. Mild sigmoid colonic diverticulosis. Normal appendix. VASCULAR/LYMPHATIC: Aortoiliac vessels are normal in course and caliber. Moderate calcific atherosclerosis. No lymphadenopathy by CT size criteria. REPRODUCTIVE: 17 mm subserosal fundal leiomyoma versus homogeneously hypodense LEFT adnexae. OTHER: No intraperitoneal free fluid or free air. MUSCULOSKELETAL: New mild T11 compression fracture. Osteopenia. Cachexia. Mild broad dextroscoliosis. L5-S1 disc prosthesis with arthrodesis. IMPRESSION: 1. No urolithiasis, obstructive uropathy nor acute intra-abdominal/pelvic process. 2. New age indeterminate mild T11 compression fracture. 3. Cirrhosis without CT findings of portal hypertension. 4. Chronic pancreatitis. Aortic Atherosclerosis (ICD10-I70.0) and Emphysema (ICD10-J43.9). Electronically Signed   By: Elon Alas M.D.   On: 05/29/2017 16:22    Procedures Procedures (including critical care time)  Medications Ordered in ED Medications  fentaNYL (SUBLIMAZE) injection 50 mcg (50 mcg Intravenous Given 05/29/17 1523)  ondansetron (ZOFRAN) injection 4 mg (4 mg Intravenous Given 05/29/17 1523)  fentaNYL (SUBLIMAZE) injection 50 mcg (50 mcg Intravenous Given 05/29/17 1716)  sodium chloride 0.9 % bolus 1,000 mL (0 mLs Intravenous Stopped 05/29/17 2056)  potassium chloride SA (K-DUR,KLOR-CON) CR tablet 40 mEq (40 mEq Oral Given 05/29/17 1841)  fentaNYL (SUBLIMAZE) injection 50 mcg (50 mcg Intravenous Given 05/29/17 1853)  morphine 4 MG/ML injection 4 mg (4 mg Intravenous Given  05/29/17 1958)  morphine 4 MG/ML injection 4 mg (4 mg Intravenous Given 05/29/17 2155)     Initial Impression / Assessment and Plan / ED Course  I have reviewed the triage vital signs and the nursing notes.  Pertinent labs & imaging results that were available during my care of the patient were reviewed by me and considered in my medical decision making (see chart for details).  Clinical Course as of  May 30 2215  Sun May 29, 2017  2155 Spoke with Dr. Denton Brick with [facility], accepting admission.  [JR]    Clinical Course User Index [JR] Russo, [place] N, PA-C    Patient presenting with acute onset of left flank pain, found to be in acute renal failure with metabolic acidosis, hyponatremia and hypokalemia. Patient with lung cancer with metastasis to the brain, receiving radiation therapy for comfort. Patient afebrile in the ED. Lactic 2.96, anion gap 17, sodium 1.30, potassium 2.8, creatinine elevated at 1.48. Chest x-ray negative for acute pathology, CT stone study negative for nephrolithiasis or intra-abdominal pathology. UA unremarkable. IV fluids given, pain managed in ED. Pt requiring medical admission. Consult made to hospitalist, spoke with Dr. Denton Brick, who is accepting admission.  Patient discussed with and seen by Dr. Dayna Barker.  The patient appears reasonably stabilized for admission considering the current resources, flow, and capabilities available in the ED at this time, and I doubt any other [facility] requiring further screening and/or treatment in the ED prior to admission.  Final Clinical Impressions(s) / ED Diagnoses   Final diagnoses:  Acute renal failure, unspecified acute renal failure type ([place])  Hyponatremia  Hypokalemia  Metabolic acidosis    New Prescriptions New Prescriptions   No medications on file     Russo, [place] N, PA-C 05/29/17 2217    Mesner, Corene Cornea, MD 05/30/17 [phone]

## 2017-05-29 NOTE — ED Triage Notes (Signed)
Pt from home with c/o flank pain and chronic back pain. Pt has hx of lung cancer but is currently not undergoing any treatment. Pt states she is feeling anxious due to the pain. Pt is currently at 100% RA but states sometimes she uses O2 at home. Pt states back pain is chronic, but left flank pain began on Thursday and she describes it as a "stitch in her side" that she rates 9/10. Pt denies urinary symptoms, n/v/d.

## 2017-05-29 NOTE — ED Notes (Signed)
Main lab called for lab draw

## 2017-05-29 NOTE — ED Notes (Signed)
Pt. Blood draw was unsuccessful x1. Nurse aware.

## 2017-05-29 NOTE — ED Notes (Signed)
Pt reminded of need for urine specimen. Pt states she will ring call bell when she is able to provide one.

## 2017-05-29 NOTE — ED Notes (Signed)
Pt called out and stated " my body lied to me. I'm not going to be able to pee."

## 2017-05-29 NOTE — ED Notes (Signed)
Pt ambulated to restroom but did not obtain a urine specimen

## 2017-05-29 NOTE — H&P (Signed)
History and Physical    SOLEI WUBBEN PYK:[id number] DOB: [birth year] DOA: 05/29/2017  PCP: [name], MD   Patient coming from: Home  Chief Complaint: Left flank pain, anorexia  HPI: Phyllis [name] is a 61 y.o. 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 Patient presented with complaints of 9/10 left flank pain of 4 days duration. Pain is nonradiating. It is no associated eval chills, dysuria or frequency. She endorses poor urine output over the past few days. Patient also endorses poor by mouth intake over the past 5 days, with nausea but no vomiting. Last bowel movement was 3-4 days ago and was normal in consistency. She last took her blood pressure medications 3 days ago.   Patient is currently still getting radiation treatments- once in 6 months. Last radiation therapy was scheduled for 05/25/17, the patient had to cancel her she was not feeling well. She is currently not with Hospice as she is still receiving radiation treatment.   ED Course: blood pressure mildly elevated at 155/93, but otherwise stable vitals. Blood work showed creatinine elevated at 1.48, potassium low at 2.8 sodium low at 130, bicarbonate low at 17,  Glucose 107, with an anion gap of 17, lactic acid elevated at 2.9. Chest x-ray was negative for acute abnormality, CT renal stone study- no urolithiasis or obstructive uropathy no acute intra-abdominal/pelvic process, new age indeterminate mildT11 compression fracture, cirrhosis and chronic pancreatitis. Patient was given 1 L bolus in the ED and by mouth potassium tablets, and pain medication.  Review of Systems:  CONSTITUTIONAL- No Fever, or chills SKIN- No Rash, colour changes or itching. HEAD- No Headache or dizziness. Mouth/throat- No Sorethroat, dentures, or bleeding gums. RESPIRATORY- No Cough or  SOB. CARDIAC- No Palpitations, or chest pain. GI- No nausea, vomiting, diarrhoea, constipation, abd pain. URINARY- No Frequency, urgency, straining or dysuria. NEUROLOGIC- No Numbness, syncope, seizures or burning. [facility]- Denies depression or anxiety.  Past Medical History:  Diagnosis Date  . Anxiety   . Asthma   . CHF (congestive heart failure) ([place])   . Chronic back pain    "in the small of my back and lower back" (01/11/2017)  . Chronic bronchitis ([place])   . Chronic pancreatitis ([place])   . COPD with emphysema ([place])   . DEPRESSION   . Eczema   . Edema 05/20/2010  . GERD 06/06/2009  . Hepatitis C    "took the treatment; I was cured" (01/11/2017)  . High cholesterol   . HYPERTENSION 06/06/2009  . Non-small cell lung cancer (NSCLC) ([place])    NSCLC/squamous cell with bilateral pulmonary nodules treated with stereotactic radiotherapy during the fall of 2017./notes 01/11/2017  . On home oxygen therapy    "3L; 24/7" (01/11/2017)  . OSTEOPOROSIS 06/06/2009  . 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 Pre-diabetes   . Prolonged QT interval 01/07/2017  . Squamous cell carcinoma 06/15/2016  . TOBACCO USE 06/05/2010  . Unspecified hypothyroidism 06/05/2010  . VITAMIN D DEFICIENCY 06/06/2009    Past Surgical History:  Procedure Laterality Date  . APPLICATION OF CRANIAL NAVIGATION Left 01/19/2017   Procedure: APPLICATION OF CRANIAL NAVIGATION;  Surgeon: Consuella Lose, MD;  Location: [place];  Service: Neurosurgery;  Laterality: Left;  . BRAIN SURGERY    . BREAST BIOPSY Right ~ 2000   "needle biopsy"   . CARDIAC CATHETERIZATION    .  COLONOSCOPY    . DILATION AND CURETTAGE OF UTERUS    . LAPAROSCOPIC CHOLECYSTECTOMY    . LEFT HEART CATHETERIZATION WITH CORONARY ANGIOGRAM N/A 03/08/2013   Procedure: LEFT HEART CATHETERIZATION WITH CORONARY ANGIOGRAM;  Surgeon: Clent Demark, MD;  Location: [facility] CATH LAB;  Service: Cardiovascular;  Laterality: N/A;  . LUMBAR FUSION    . LUMBAR  LAMINECTOMY    . PR DURAL GRAFT REPAIR,SPINE DEFECT Left 01/19/2017   Procedure: LEFT CRANIOTOMY FOR STERIOTACTIC BRAIN BIOPSY;  Surgeon: Consuella Lose, MD;  Location: [place];  Service: Neurosurgery;  Laterality: Left;  LEFT CRANIOTOMY FOR STERIOTACTIC BRAIN BIOPSY  . TONSILLECTOMY    . TUBAL LIGATION       reports that she quit smoking about 2 years ago. Her smoking use included Cigarettes. She has a 3.50 pack-year smoking history. She has never used smokeless tobacco. She reports that she does not drink alcohol or use drugs.  Allergies  Allergen Reactions  . Amlodipine Swelling  . Meperidine Hcl Other (See Comments)    hallucinations  . Naproxen Other (See Comments)    Extreme bruising    Family History  Problem Relation Age of Onset  . COPD Father   . Hypertension Mother   . Kidney disease Mother   . Colon cancer Neg Hx   . Stomach cancer Neg Hx     Prior to Admission medications   Medication Sig Start Date End Date Taking? Authorizing Provider  ALPRAZolam Duanne Moron) 0.5 MG tablet Take 1 tablet (0.5 mg total) by mouth 3 (three) times daily as needed. for anxiety 05/26/17  Yes [name], MD  aspirin EC 81 MG tablet Take 81 mg by mouth daily.   Yes [provider]  blood glucose meter kit and supplies KIT Dispense based on patient and insurance preference. Use up to four times daily as directed. (FOR ICD-9 250.00, 250.01). 02/17/17  Yes Hongalgi, Lenis Dickinson, MD  fentaNYL (DURAGESIC - DOSED MCG/HR) 50 MCG/HR Place 1 patch (50 mcg total) onto the skin every 3 (three) days. 05/26/17  Yes [name], MD  gabapentin (NEURONTIN) 300 MG capsule Take 1 capsule (300 mg total) by mouth 2 (two) times daily. 02/17/17  Yes Hongalgi, Lenis Dickinson, MD  Glycopyrrolate [facility] REFILL KIT) 25 MCG/ML SOLN Inhale 1 Act into the lungs 2 (two) times daily. 05/13/17  Yes [name], MD  magnesium oxide (MAG-OX) 400 MG tablet Take 1 tablet (400 mg total) by mouth 3 (three) times daily  after meals. 04/25/17  Yes Aline August, MD  Oxycodone HCl 10 MG TABS Take 5 mg by mouth 3 (three) times daily as needed for severe pain. 05/26/17  Yes [provider]  carvedilol (COREG) 6.25 MG tablet TAKE 1 TABLET(6.25 MG) BY MOUTH TWICE DAILY WITH A MEAL 02/17/17   Hongalgi, Lenis Dickinson, MD  potassium chloride SA (K-DUR,KLOR-CON) 20 MEQ tablet Take 1 tablet (20 mEq total) by mouth 2 (two) times daily. Patient not taking: Reported on 05/19/2017 05/10/17 05/13/17  Duffy Bruce, MD  triamterene-hydrochlorothiazide (MAXZIDE-25) 37.5-25 MG tablet Take 1 tablet by mouth daily.    [provider]    Physical Exam: Vitals:   05/29/17 1524 05/29/17 1728 05/29/17 1933 05/29/17 2149  BP: (!) 173/90 137/86 (!) 188/81 (!) 172/89  Pulse: 77 77 76 86  Resp: _0 Temp:   98.3 F (36.8 C) 98.4 F (36.9 C)  TempSrc:   Oral Oral  SpO2: 97% 94% 97% 95%  Constitutional: NAD, calm, comfortable Vitals:   05/29/17 1524 05/29/17 1728 05/29/17 1933 05/29/17 2149  BP: (!) 173/90 137/86 (!) 188/81 (!) 172/89  Pulse: 77 77 76 86  Resp: _0 Temp:   98.3 F (36.8 C) 98.4 F (36.9 C)  TempSrc:   Oral Oral  SpO2: 97% 94% 97% 95%   Eyes: PERRL, lids and conjunctivae normal ENMT: Mucous membranes are moist, tongue is red appearing, without pain or obvious swelling. Posterior pharynx clear of any exudate or lesions. Poor dentition Neck: normal, supple, no masses, no thyromegaly Respiratory: Reduced breath sounds left lung base posteriorly, No accessory muscle use.  Cardiovascular: Regular rate and rhythm, no murmurs / rubs / gallops. No extremity edema. 2+ pedal pulses.  Abdomen: no tenderness, no masses palpated. No hepatosplenomegaly. Bowel sounds positive.  Musculoskeletal: no clubbing / cyanosis. No joint deformity upper and lower extremities. Good ROM, no contractures. Normal muscle tone. Tenderness to palpation left rib area Skin: no rashes, lesions, ulcers. No  induration Neurologic: CN 2-12 grossly intact. Sensation intact, Strength 4/5 in all globally Psychiatric: Normal judgment and insight. Alert and oriented x 3. Normal mood.   Labs on Admission: I have personally reviewed following labs and imaging studies  CBC:  Recent Labs Lab 05/29/17 1653  WBC 8.8  NEUTROABS 6.2  HGB 17.5*  HCT 48.0*  MCV 85.9  PLT 915   Basic Metabolic Panel:  Recent Labs Lab 05/29/17 1653 05/29/17 2230  NA 130*  --   K 2.8*  --   CL 96*  --   CO2 17*  --   GLUCOSE 107*  --   BUN 37*  --   CREATININE 1.48*  --   CALCIUM 8.1*  --   MG  --  1.8   GFR: Estimated Creatinine Clearance: 33.8 mL/min (A) (by C-G formula based on SCr of 1.48 mg/dL (H)). Liver Function Tests:  Recent Labs Lab 05/29/17 1653  AST 23  ALT 11*  ALKPHOS 80  BILITOT 0.9  PROT 7.9  ALBUMIN 4.5   Urine analysis:    Component Value Date/Time   COLORURINE YELLOW 05/29/2017 2034   APPEARANCEUR CLEAR 05/29/2017 2034   LABSPEC 1.010 05/29/2017 2034   PHURINE 6.0 05/29/2017 2034   GLUCOSEU NEGATIVE 05/29/2017 2034   GLUCOSEU NEGATIVE 12/16/2016 1444   HGBUR NEGATIVE 05/29/2017 2034   BILIRUBINUR NEGATIVE 05/29/2017 2034   KETONESUR NEGATIVE 05/29/2017 2034   PROTEINUR NEGATIVE 05/29/2017 2034   UROBILINOGEN 0.2 12/16/2016 1444   NITRITE NEGATIVE 05/29/2017 2034   LEUKOCYTESUR TRACE (A) 05/29/2017 2034    Radiological Exams on Admission: Dg Chest 2 View  Result Date: 05/29/2017 CLINICAL DATA:  Chest pain for 5 days. Personal history of lung carcinoma. COPD. EXAM: CHEST  2 VIEW COMPARISON:  05/19/2017 FINDINGS: The heart size and mediastinal contours are within normal limits. Aortic atherosclerosis. Severe emphysema again demonstrated as well as bibasilar scarring. No evidence of pulmonary infiltrate or edema. No evidence of pneumothorax or pleural effusion. The visualized skeletal structures are unremarkable. Diffuse pancreatic calcification again seen, consistent with  chronic pancreatitis. IMPRESSION: Emphysema and bibasilar scarring.  No active lung disease. Electronically Signed   By: Earle Gell M.D.   On: 05/29/2017 15:46   Ct Renal Stone Study  Result Date: 05/29/2017 CLINICAL DATA:  Chronic back pain with new LEFT flank pain for 3 days. Suspect kidney stones. History of lung cancer. EXAM: CT ABDOMEN AND PELVIS WITHOUT CONTRAST TECHNIQUE: Multidetector CT imaging of the abdomen and  pelvis was performed following the standard protocol without IV contrast. COMPARISON:  Abdominal radiograph Feb 16, 2017 and PET-CT May 14, 2016 and CT chest May 27, 2016 FINDINGS: LOWER CHEST: Centrilobular emphysema. LEFT lower lobe bronchiectasis with unchanged mucoid impaction. Smaller approximate 18 mm consolidation LEFT lower lobe at site of prior biopsy. No pleural effusion. Bilateral small fat containing diaphragmatic hernias. Heart size is normal. No pericardial effusions. Mild gas distended distal esophagus associated with reflux disease. HEPATOBILIARY: Nodular liver contour consistent with cirrhosis. Noncontrast CT may be insensitive for small tumor. Status post cholecystectomy. PANCREAS: Diffusely calcified pancreas compatible chronic pancreatitis. No acute component. SPLEEN: Normal. ADRENALS/URINARY TRACT: Kidneys are orthotopic, demonstrating normal size and morphology. No nephrolithiasis, hydronephrosis; limited assessment for renal masses on this nonenhanced examination. 2.3 cm RIGHT lower pole cyst, homogeneously hypodense and hypometabolic by prior PET-CT. The unopacified ureters are normal in course and caliber. Urinary bladder is well distended and unremarkable. Normal adrenal glands. STOMACH/BOWEL: The stomach, small and large bowel are normal in course and caliber without inflammatory changes, sensitivity decreased by lack of enteric contrast. Mild sigmoid colonic diverticulosis. Normal appendix. VASCULAR/LYMPHATIC: Aortoiliac vessels are normal in course and  caliber. Moderate calcific atherosclerosis. No lymphadenopathy by CT size criteria. REPRODUCTIVE: 17 mm subserosal fundal leiomyoma versus homogeneously hypodense LEFT adnexae. OTHER: No intraperitoneal free fluid or free air. MUSCULOSKELETAL: New mild T11 compression fracture. Osteopenia. Cachexia. Mild broad dextroscoliosis. L5-S1 disc prosthesis with arthrodesis. IMPRESSION: 1. No urolithiasis, obstructive uropathy nor acute intra-abdominal/pelvic process. 2. New age indeterminate mild T11 compression fracture. 3. Cirrhosis without CT findings of portal hypertension. 4. Chronic pancreatitis. Aortic Atherosclerosis (ICD10-I70.0) and Emphysema (ICD10-J43.9). Electronically Signed   By: Elon Alas M.D.   On: 05/29/2017 16:22   EKG: None  Assessment/Plan Principal Problem:   AKI (acute kidney injury) ([place]) Active Problems:   Hep C w/o coma, chronic (HCC)   Hypothyroidism   Depression with anxiety   CAD (coronary artery disease), native coronary artery   COPD (chronic obstructive pulmonary disease) with chronic bronchitis (HCC)   Pulmonary hypertension due to COPD ([place])   Type II diabetes mellitus with manifestations ([place])   Squamous cancer of left lower lobe of lung (HCC)   Chronic diastolic CHF (congestive heart failure) (HCC)   Brain metastasis (HCC)   Opiate dependence, continuous ([place])  Acute kidney injury- likely due to poor by mouth intake X5 days. Likely some contribution of antihypertensives- stopped 3 days ago. With electrolytes abnormalities hyponatremia- NA- 130 and hypokalemia K- 2.8. Recent admission 04/2017 for dehydration. - Tele, Obs - IV fluids - Hold HCTZ- triamterene  Electrolyte abnormalities- hyponatremia, hypokalemia. - likely 2/2 diuretic use and poor by mouth intake - Replete - BMP a.m. -  Magnesium- 1.8  FLank pain- appears musculoskeletal with tenderness to palpation. CT renal stone serves study negative. WBC normal at 8.8.she denies dysuria or  frequency. - continue home fentanyl - When necessary morphine 2 mg  Hypothyroidism TSH 3.06 - continue home Synthroid  DM2: Hold oral hypoglycemics Blood sugars controlled Outpatient follow-up with primary care provider  Thrombocytopenia: 126- chronic and stable  Severe Protein Calorie Malnutrition: Hx of metastatic lung cancer/chronic disease Outpatient follow-up  Chronic Pain, :continue home fentanyl patches. Patient reports in continued pain despitepatch. She is not on oxycodone  Though listed on medication list - IV morphine when necessary.  Chronic diastolic congestive heart failure- hypovolemic. Echo 01/06/2017 EF 60-65%, G1DD. - Continue Coreg. - Hydrate  History of metastatic lung cancer with metastasis to the brain- with  chest x-ray showing emphysema and bibasilar scarring no active lung disease - 04/24/17- CT chest without contrast showed decreased size of pericardial effusion. 04/25/17- MRI of the brain showed Decreasing size of focal enhancing lesion in the left splenium of the corpus callosum compatible with known metastatic disease. 2. Decreased mass effect associated with this lesion. - Consider Oncology consult for recs. She has missed one radiation session-  - Pt has had palliative care encounters, but she is getting radiation so she is not hospice care  - PT eval  DVT prophylaxis: Lovenox   Code Status: Full Family Communication: None at bedside Disposition Plan: TO be determined Consults called: None, consider Oncology consult am Admission status: Obs, tele   Bethena Roys MD [facility] Pager [phone]  If 7PM-7AM, please contact night-coverage [email or web address] Password [email or web address]  05/29/2017, 11:18 PM

## 2017-05-29 NOTE — ED Notes (Signed)
No respiratory or acute distress noted alert and oriented x 3 no reaction to medication noted call light in reach visitor at bedside.

## 2017-05-30 DIAGNOSIS — J449 Chronic obstructive pulmonary disease, unspecified: Secondary | ICD-10-CM | POA: Diagnosis not present

## 2017-05-30 DIAGNOSIS — I5032 Chronic diastolic (congestive) heart failure: Secondary | ICD-10-CM

## 2017-05-30 DIAGNOSIS — N179 Acute kidney failure, unspecified: Principal | ICD-10-CM

## 2017-05-30 DIAGNOSIS — C7931 Secondary malignant neoplasm of brain: Secondary | ICD-10-CM

## 2017-05-30 LAB — BASIC METABOLIC PANEL
Anion gap: 9 (ref 5–15)
BUN: 27 mg/dL — AB (ref 6–20)
CHLORIDE: 106 mmol/L (ref 101–111)
CO2: 18 mmol/L — AB (ref 22–32)
CREATININE: 1.11 mg/dL — AB (ref 0.44–1.00)
Calcium: 8.6 mg/dL — ABNORMAL LOW (ref 8.9–10.3)
GFR calc Af Amer: >60 mL/min (ref >60)
GFR calc non Af Amer: 52 mL/min — ABNORMAL LOW (ref >60)
Glucose, Bld: 110 mg/dL — ABNORMAL HIGH (ref 65–99)
POTASSIUM: 3 mmol/L — AB (ref 3.5–5.1)
SODIUM: 133 mmol/L — AB (ref 135–145)

## 2017-05-30 LAB — GLUCOSE, CAPILLARY
GLUCOSE-CAPILLARY: 161 mg/dL — AB (ref 65–99)
Glucose-Capillary: 71 mg/dL (ref 65–99)
Glucose-Capillary: 166 mg/dL — ABNORMAL HIGH (ref 65–99)

## 2017-05-30 MED ORDER — MIRTAZAPINE 7.5 MG PO TABS
7.5000 mg | ORAL_TABLET | Freq: Every day | ORAL | Status: DC
  Administered 2017-05-30 – 2017-06-05 (×8): 7.5 mg via ORAL
  Filled 2017-05-30 (×8): qty 1

## 2017-05-30 MED ORDER — CARVEDILOL 6.25 MG PO TABS
6.2500 mg | ORAL_TABLET | Freq: Two times a day (BID) | ORAL | Status: DC
  Administered 2017-05-30 – 2017-06-03 (×9): 6.25 mg via ORAL
  Filled 2017-05-30 (×9): qty 1

## 2017-05-30 MED ORDER — ONDANSETRON HCL 4 MG PO TABS: 4.0000 mg | ORAL_TABLET | Freq: Four times a day (QID) | ORAL | Status: DC | PRN

## 2017-05-30 MED ORDER — LACTATED RINGERS IV SOLN
INTRAVENOUS | Status: DC
  Administered 2017-05-30 – 2017-05-31 (×3): via INTRAVENOUS

## 2017-05-30 MED ORDER — INSULIN ASPART 100 UNIT/ML ~~LOC~~ SOLN
0.0000 [IU] | Freq: Three times a day (TID) | SUBCUTANEOUS | Status: DC
  Administered 2017-05-30: 2 [IU] via SUBCUTANEOUS
  Administered 2017-05-31: 1 [IU] via SUBCUTANEOUS
  Administered 2017-05-31 – 2017-06-01 (×4): 2 [IU] via SUBCUTANEOUS
  Administered 2017-06-02 (×2): 1 [IU] via SUBCUTANEOUS
  Administered 2017-06-02: 3 [IU] via SUBCUTANEOUS
  Administered 2017-06-03: 1 [IU] via SUBCUTANEOUS
  Administered 2017-06-03: 2 [IU] via SUBCUTANEOUS
  Administered 2017-06-03: 10 [IU] via SUBCUTANEOUS
  Administered 2017-06-04: 3 [IU] via SUBCUTANEOUS
  Administered 2017-06-04: 9 [IU] via SUBCUTANEOUS
  Administered 2017-06-04: 2 [IU] via SUBCUTANEOUS
  Administered 2017-06-05: 3 [IU] via SUBCUTANEOUS
  Administered 2017-06-05: 2 [IU] via SUBCUTANEOUS
  Administered 2017-06-06: 3 [IU] via SUBCUTANEOUS

## 2017-05-30 MED ORDER — ENOXAPARIN SODIUM 40 MG/0.4ML ~~LOC~~ SOLN
40.0000 mg | SUBCUTANEOUS | Status: DC
  Administered 2017-05-30 – 2017-06-06 (×8): 40 mg via SUBCUTANEOUS
  Filled 2017-05-30 (×9): qty 0.4

## 2017-05-30 MED ORDER — ASPIRIN EC 81 MG PO TBEC
81.0000 mg | DELAYED_RELEASE_TABLET | Freq: Every day | ORAL | Status: DC
  Administered 2017-05-30 – 2017-06-06 (×8): 81 mg via ORAL
  Filled 2017-05-30 (×8): qty 1

## 2017-05-30 MED ORDER — POLYETHYLENE GLYCOL 3350 17 G PO PACK
17.0000 g | PACK | Freq: Every day | ORAL | Status: DC
  Administered 2017-05-31 – 2017-06-06 (×7): 17 g via ORAL
  Filled 2017-05-30 (×8): qty 1

## 2017-05-30 MED ORDER — FENTANYL 50 MCG/HR TD PT72
50.0000 ug | MEDICATED_PATCH | TRANSDERMAL | Status: DC
  Administered 2017-05-30 – 2017-06-05 (×3): 50 ug via TRANSDERMAL
  Filled 2017-05-30 (×3): qty 1

## 2017-05-30 MED ORDER — GLYCOPYRROLATE 25 MCG/ML IN SOLN: 1.0000 | Freq: Two times a day (BID) | RESPIRATORY_TRACT | Status: DC

## 2017-05-30 MED ORDER — LACTATED RINGERS IV SOLN
INTRAVENOUS | Status: DC
  Filled 2017-05-30: qty 1000

## 2017-05-30 MED ORDER — MAGNESIUM OXIDE 400 (241.3 MG) MG PO TABS
400.0000 mg | ORAL_TABLET | Freq: Three times a day (TID) | ORAL | Status: DC
  Administered 2017-05-30 – 2017-06-06 (×22): 400 mg via ORAL
  Filled 2017-05-30 (×22): qty 1

## 2017-05-30 MED ORDER — ONDANSETRON HCL 4 MG/2ML IJ SOLN: 4.0000 mg | Freq: Four times a day (QID) | INTRAMUSCULAR | Status: DC | PRN

## 2017-05-30 MED ORDER — ALPRAZOLAM 0.5 MG PO TABS
0.5000 mg | ORAL_TABLET | Freq: Three times a day (TID) | ORAL | Status: DC | PRN
  Administered 2017-05-30 – 2017-06-06 (×11): 0.5 mg via ORAL
  Filled 2017-05-30 (×11): qty 1

## 2017-05-30 MED ORDER — GABAPENTIN 300 MG PO CAPS
300.0000 mg | ORAL_CAPSULE | Freq: Two times a day (BID) | ORAL | Status: DC
  Administered 2017-05-30 – 2017-06-06 (×16): 300 mg via ORAL
  Filled 2017-05-30 (×16): qty 1

## 2017-05-30 MED ORDER — MORPHINE SULFATE (PF) 2 MG/ML IV SOLN
2.0000 mg | INTRAVENOUS | Status: DC | PRN
  Administered 2017-05-30 – 2017-06-01 (×12): 2 mg via INTRAVENOUS
  Filled 2017-05-30 (×12): qty 1

## 2017-05-30 MED ORDER — POTASSIUM CHLORIDE CRYS ER 20 MEQ PO TBCR
50.0000 meq | EXTENDED_RELEASE_TABLET | ORAL | Status: AC
  Administered 2017-05-30 (×2): 50 meq via ORAL
  Filled 2017-05-30 (×2): qty 1

## 2017-05-30 MED ORDER — POTASSIUM CHLORIDE IN NACL 40-0.9 MEQ/L-% IV SOLN
INTRAVENOUS | Status: DC
  Administered 2017-05-30: 125 mL/h via INTRAVENOUS
  Filled 2017-05-30 (×2): qty 1000

## 2017-05-30 MED ORDER — ADULT MULTIVITAMIN W/MINERALS CH
1.0000 | ORAL_TABLET | Freq: Every day | ORAL | Status: DC
  Administered 2017-05-30 – 2017-06-06 (×8): 1 via ORAL
  Filled 2017-05-30 (×8): qty 1

## 2017-05-30 NOTE — Progress Notes (Signed)
Initial Nutrition Assessment  DOCUMENTATION CODES:   Non-severe (moderate) malnutrition in context of chronic illness  INTERVENTION:  - Will order Magic Cup BID with meals, each supplement provides 290 kcal and 9 grams of protein - Will order daily multivitamin with minerals. - Continue to encourage PO intakes of meals and supplements.   NUTRITION DIAGNOSIS:   Malnutrition (moderate/non-severe) related to chronic illness, catabolic illness, cancer and cancer related treatments, poor appetite as evidenced by moderate depletions of muscle mass, moderate depletion of body fat, energy intake < 75% for > 7 days.  GOAL:   Patient will meet greater than or equal to 90% of their needs  MONITOR:   PO intake, Supplement acceptance, Weight trends, Labs  REASON FOR ASSESSMENT:   Malnutrition Screening Tool  ASSESSMENT:   61 y.o. 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 Patient presented with complaints of 9/10 non-radiating left flank pain of 4 days duration. She endorses poor urine output over the past few days. Patient also endorses poor by mouth intake over the past 5 days, with nausea but no vomiting. Last bowel movement was 3-4 days ago and was normal in consistency. She last took her blood pressure medications 3 days ago.  Pt seen for MST. BMI indicates normal weight. No intakes documented since admission. Pt reports that she has been feeling nauseated for ~1 week which has led her to eat less at meals. She has not had any episodes of emesis since admission or PTA. She denies abdominal pain other than L flank pain. Pt has tried oral nutrition supplements in the past (such as Ensure, Boost, and [employer]) she has also tried [employer] during a previous admission; will trial this supplement again. Reviewed DR. Gherghe's note from this AM, specifically about husband's  concern with pain medication usage. Question if this is leading to some degree of nausea and if effects of large quantities of medication are having an effect on appetite (such as from narcotic-induced constipation).   Physical assessment shows mild to moderate muscle and mild to moderate fat wasting. Per chart review, weight has been fluctuating (106-121 lbs) since 02/11/17. Most recently, she gained 8 lbs from 7/25-8/16; no weight was obtained upon admission yesterday.  Medications reviewed; sliding scale Novolog, 400 mg oral Mag-ox TID, 1 packet Miralax/day, 40 mEq oral KCl x1 dose yesterday. Labs reviewed; CBG: 161 mg/dL today, Na: 133 mmol/L, K: 3 mmol/L, BUN: 27 mg/dL, creatinine: 1.11 mg/dL, Ca: 8.6 mg/dL, GFR: 52 mL/min.  IVF: LR @ 100 mL/hr.     Diet Order:  Diet Heart Room service appropriate? Yes; Fluid consistency: Thin  Skin:  Reviewed, no issues  Last BM:  PTA/unknown  Height:   Ht Readings from Last 1 Encounters:  05/30/17 _0  (1.651 m)    Weight:   Wt Readings from Last 1 Encounters:  05/19/17 120 lb (54.4 kg)    Ideal Body Weight:  56.82 kg  BMI:  There is no height or weight on file to calculate BMI.  Estimated Nutritional Needs:   Kcal:  1635-1905 (30-35 kcal/kg)  Protein:  80-90 grams  Fluid:  >/= 1.8 L/day  EDUCATION NEEDS:   No education needs identified at this time    Jarome Matin, MS, RD, LDN, CNSC Inpatient Clinical Dietitian Pager # [phone] After hours/weekend pager # [phone]

## 2017-05-30 NOTE — Care Management Note (Signed)
Case Management Note  Patient Details  Name: Phyllis [name] MRN: [id number] Date of Birth: 1956-04-16  Subjective/Objective:                  Left flank pain  Action/Plan: Date:  May 30, 2017  Chart reviewed for concurrent status and case management needs.  Will continue to follow patient progress.  Discharge Planning: following for needs  Expected discharge date: [id number]  Velva Harman, BSN, [place], [place]   Expected Discharge Date:                  Expected Discharge Plan:  Home/Self Care  In-House Referral:     Discharge planning Services  CM Consult  Post Acute Care Choice:  Resumption of Svcs/PTA Provider Choice offered to:     DME Arranged:    DME Agency:     HH Arranged:    [place] Agency:     Status of Service:  In process, will continue to follow  If discussed at Long Length of Stay Meetings, dates discussed:    Additional Comments:  Leeroy Cha, RN 05/30/2017, 9:49 AM

## 2017-05-30 NOTE — Evaluation (Addendum)
Physical Therapy Evaluation Patient Details Name: Phyllis [name] MRN: [id number] DOB: [birth year] Today's Date: 05/30/2017   History of Present Illness  61 year old female with hep C, hypothyroidism, metastatic lung cancer currently getting radiation therapy, COPD, pulmonary hypertension on chronic home oxygen, diastolic CHF, who was admitted on 8/26 with poor p.o. intake, acute renal failure and hypokalemia.  Clinical Impression  Pt admitted with above diagnosis. Pt currently with functional limitations due to the deficits listed below (see PT Problem List).  Pt will benefit from skilled PT to increase their independence and safety with mobility to allow discharge to the venue listed below.   Pt is fall risk d/t decr balance, decr insight into deficits, reports multiple falls at home;Pt amb 120' with min assist on RA with O2 sats 99%;  pt appears to be in denial regarding her dx/deficits; no family present at time of PT eval and pt declines palliative care to help manage symptoms; would benefit from HHPT, RW and 3in1 if agreeable;  will continue follow in acute setting    Follow Up Recommendations Home health PT;Supervision for mobility/OOB    Equipment Recommendations   (would benefit from RW, pt refuses)    Recommendations for Other Services       Precautions / Restrictions Precautions Precautions: Fall Restrictions Weight Bearing Restrictions: No      Mobility  Bed Mobility Overal bed mobility: Needs Assistance Bed Mobility: Supine to Sit;Sit to Supine     Supine to sit: Supervision Sit to supine: Supervision   General bed mobility comments: for safety  Transfers Overall transfer level: Needs assistance Equipment used: None Transfers: Sit to/from Stand Sit to Stand: Min guard         General transfer comment: for safety and balance on initial standing  Ambulation/Gait Ambulation/Gait assistance: Min guard;Min assist Ambulation Distance (Feet): 120  Feet Assistive device:  (IV pole)       General Gait Details: pt refused to use RW or other AD stating she does not need it; pt with unsteady balance although no overt LOB, reliant on IV pole for support, min/guard for balance and safety  Stairs            Wheelchair Mobility    Modified Rankin (Stroke Patients Only)       Balance Overall balance assessment: Needs assistance;History of Falls Sitting-balance support: Feet supported;No upper extremity supported Sitting balance-Leahy Scale: Good       Standing balance-Leahy Scale: Fair Standing balance comment: able to maintain static standing without UE support; reliant on UE support for dynamic/gait Single Leg Stance - Right Leg: 0 Single Leg Stance - Left Leg: 0 Tandem Stance - Right Leg:  (unable)                       Pertinent Vitals/Pain Pain Assessment: Faces Pain Score: 8  Faces Pain Scale: Hurts whole lot Pain Location: back Pain Intervention(s): Monitored during session;Premedicated before session;Patient requesting pain meds-RN notified;Limited activity within patient's tolerance;Repositioned (pt unable to adjust bed d/t all functions lock--unlocked and pt able to adjust position, reported incr comfort)    Home Living Family/patient expects to be discharged to:: Private residence Living Arrangements: Spouse/significant other Available Help at Discharge: Family;Available 24 hours/day Type of Home: House Home Access: Stairs to enter Entrance Stairs-Rails: None Entrance Stairs-Number of Steps: 4 Home Layout: One level Home Equipment: Grab bars - tub/shower;Walker - 2 wheels;Other (comment) (O2)      Prior Function  Comments: walks without AD, uses 3L O2 "sometimes"; pt reports  she is IND and nothing in her life ahas changed; she report sher husand doesn't do anything at home except "get on my nerves"     Hand Dominance        Extremity/Trunk Assessment   Upper Extremity  Assessment Upper Extremity Assessment: Generalized weakness    Lower Extremity Assessment Lower Extremity Assessment: Generalized weakness       Communication   Communication: No difficulties  Cognition Arousal/Alertness: Awake/alert Behavior During Therapy: WFL for tasks assessed/performed Overall Cognitive Status: Impaired/Different from baseline Area of Impairment: Safety/judgement                         Safety/Judgement: Decreased awareness of deficits            General Comments      Exercises     Assessment/Plan    PT Assessment Patient needs continued PT services  PT Problem List Decreased strength;Decreased range of motion;Decreased activity tolerance;Decreased mobility;Decreased balance       PT Treatment Interventions DME instruction;Gait training;Functional mobility training;Therapeutic activities;Therapeutic exercise;Patient/family education;Balance training    PT Goals (Current goals can be found in the Care Plan section)  Acute Rehab PT Goals Patient Stated Goal: home soon and have less pain PT Goal Formulation: With patient Time For Goal Achievement: 06/06/17 Potential to Achieve Goals: Good    Frequency Min 3X/week   Barriers to discharge        Co-evaluation               AM-PAC PT "6 Clicks" Daily Activity  Outcome Measure Difficulty turning over in bed (including adjusting bedclothes, sheets and blankets)?: None Difficulty moving from lying on back to sitting on the side of the bed? : A Little Difficulty sitting down on and standing up from a chair with arms (e.g., wheelchair, bedside commode, etc,.)?: A Little Help needed moving to and from a bed to chair (including a wheelchair)?: A Little Help needed walking in hospital room?: A Little Help needed climbing 3-5 steps with a railing? : A Little 6 Click Score: 19    End of Session   Activity Tolerance: Patient tolerated treatment well Patient left: in bed;with call  bell/phone within reach;with family/visitor present   PT Visit Diagnosis: Unsteadiness on feet (R26.81);Difficulty in walking, not elsewhere classified (R26.2);Muscle weakness (generalized) (M62.81);Pain Pain - part of body:  (back)    Time: 1211-1224 PT Time Calculation (min) (ACUTE ONLY): 13 min   Charges:   PT Evaluation $PT Eval Low Complexity: 1 Low     PT G Codes:   PT G-Codes **NOT FOR INPATIENT CLASS** Functional Assessment Tool Used: AM-PAC 6 Clicks Basic Mobility;Clinical judgement Functional Limitation: Mobility: Walking and moving around Mobility: Walking and Moving Around Current Status (A0355): At least 1 percent but less than 20 percent impaired, limited or restricted Mobility: Walking and Moving Around Goal Status [phone]): At least 1 percent but less than 20 percent impaired, limited or restricted    Kenyon Ana, PT Pager: [phone] 05/30/2017  [facility] 05/30/2017, 12:50 PM

## 2017-05-30 NOTE — Progress Notes (Addendum)
PROGRESS NOTE  Phyllis [name]:[id number] DOB: 1956/05/07 DOA: 05/29/2017 PCP: [name], MD   LOS: 0 days   Brief Narrative / Interim history: 61 year old female with hep C, hypothyroidism, metastatic lung cancer currently getting radiation therapy, COPD, pulmonary hypertension on chronic home oxygen, diastolic CHF, who was admitted on 8/26 with poor p.o. intake, acute renal failure and hypokalemia.  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 intake as well as her home diuretics -Hold home diuretics.  Creatinine has improved today however still remains abnormal, continue IV fluids  Hyponatremia/hypokalemia -Likely in the setting of dehydration, sodium and potassium are improving with fluids and potassium supplementations, -Continue IV fluids as above -Replete potassium today, recheck tomorrow morning  Flank pain -Patient with long-standing history of flank pain and back pain on multiple pain medications at home including fentanyl patch as well as oxycodone. -The husband who is very concerned and tells me that patient is taking more than prescribed in terms of oxycodone as well as her patches.  He is also mentioning that she is consuming large amounts of ibuprofen as well, and he fears that she has become dependent on narcotics.  -CT scan on admission without acute findings  Hypothyroidism -TSH  3.0, continue home Synthroid  Type 2 diabetes mellitus -Hold oral hypoglycemics, place on sliding scale.  Most recent A1c last month shows good control at 6.3  Anxiety -Xanax as needed  Severe protein calorie malnutrition -In the setting of metastatic lung disease  Chronic diastolic CHF -She appears hypovolemic, continue fluids.  Most recent echo in April 2018 with 60-65% EF, grade 1 diastolic dysfunction.  Continue Coreg.  Thrombocytopenia -Chronic and stable.  No bleeding  Lung cancer with brain metastases -She is getting outpatient radiation therapy -Husband is telling me that he would be interested in hospice however patient herself does not want to talk to the palliative care.  He would appreciate help at home, however understands that while she is getting radiation therapy hospice cannot be set up at this point  COPD -Stable, no wheezing, continue home oxygen   DVT prophylaxis: Lovenox Code Status: Full code Family Communication: husband at bedside Disposition Plan: home in 1 day pending renal function / electrolyte improvement  Consultants:   None   Procedures:   None   Antimicrobials:  None    Subjective: - no chest pain, shortness of breath, no abdominal pain, nausea or vomiting.   Objective: Vitals:   05/29/17 2149 05/30/17 0123 05/30/17 0126 05/30/17 0518  BP: (!) 172/89 (!) 159/95  (!) 157/74  Pulse: 86 75  71  Resp: _0 Temp: 98.4 F (36.9 C) 98.1 F (36.7 C)  97.7 F (36.5 C)  TempSrc: Oral Oral  Oral  SpO2: 95% 93%  98%  Height:   _1  (1.651 m)     Intake/Output Summary (Last 24 hours) at 05/30/17 1131  Last data filed at 05/30/17 0400  Gross per 24 hour  Intake           281.25 ml  Output                0 ml  Net           281.25 ml   There were no vitals filed for this visit.  Examination:  Vitals:   06-27-2017 2149 05/30/17 0123 05/30/17 0126 05/30/17 0518  BP: (!) 172/89 (!) 159/95  (!) 157/74  Pulse: 86 75  71  Resp:  _0 Temp: 98.4 F (36.9 C) 98.1 F (36.7 C)  97.7 F (36.5 C)  TempSrc: Oral Oral  Oral  SpO2: 95% 93%  98%  Height:   _1  (1.651 m)     Constitutional: NAD Eyes: lids and conjunctivae normal Respiratory: clear to auscultation bilaterally, no wheezing, no crackles. Normal respiratory effort. No accessory muscle use.  Cardiovascular: Regular rate and rhythm, no murmurs / rubs / gallops. No LE edema. 2+ pedal pulses. No carotid bruits.  Abdomen: no tenderness. Bowel sounds positive.  Musculoskeletal: no clubbing / cyanosis. No joint deformity upper and lower extremities. No contractures. Normal muscle tone.  Skin: no rashes, lesions, ulcers. No induration Neurologic: CN 2-12 grossly intact. Strength 5/5 in all 4.  Psychiatric: Normal judgment and insight. Alert and oriented x 3. Normal mood.    Data Reviewed: I have independently reviewed following labs and imaging studies    CBC:  Recent Labs Lab Jun 27, 2017 1653  WBC 8.8  NEUTROABS 6.2  HGB 17.5*  HCT 48.0*  MCV 85.9  PLT 242   Basic Metabolic Panel:  Recent Labs Lab 2017/06/27 1653 06/27/17 2230 05/30/17 1023  NA 130*  --  133*  K 2.8*  --  3.0*  CL 96*  --  106  CO2 17*  --  18*  GLUCOSE 107*  --  110*  BUN 37*  --  27*  CREATININE 1.48*  --  1.11*  CALCIUM 8.1*  --  8.6*  MG  --  1.8  --    GFR: Estimated Creatinine Clearance: 45.1 mL/min (A) (by C-G formula based on SCr of 1.11 mg/dL (H)). Liver Function Tests:  Recent Labs Lab 06/27/2017 1653  AST 23  ALT 11*  ALKPHOS 80  BILITOT 0.9  PROT 7.9  ALBUMIN 4.5   No results for input(s): LIPASE, AMYLASE in the last 168 hours. No results for input(s): AMMONIA in the last 168 hours. Coagulation Profile: No results for input(s): INR, PROTIME in the last 168 hours. Cardiac Enzymes: No results for input(s): CKTOTAL, CKMB, CKMBINDEX, TROPONINI in the last 168 hours. BNP (last 3 results) No results for input(s): PROBNP in the last 8760  hours. HbA1C: No results for input(s): HGBA1C in the last 72 hours. CBG: No results for input(s): GLUCAP in the last 168 hours. Lipid Profile: No results for input(s): CHOL, HDL, LDLCALC, TRIG, CHOLHDL, LDLDIRECT in the last 72 hours. Thyroid Function Tests: No results for input(s): TSH, T4TOTAL, FREET4, T3FREE, THYROIDAB in the last 72 hours. Anemia Panel: No results for input(s): VITAMINB12, FOLATE, FERRITIN, TIBC, IRON, RETICCTPCT in the last 72 hours. Urine analysis:    Component Value Date/Time   COLORURINE YELLOW June 27, 2017 2034   APPEARANCEUR CLEAR 27-Jun-2017 2034   LABSPEC 1.010 06-27-17 2034   PHURINE 6.0 2017/06/27 2034   GLUCOSEU NEGATIVE June 27, 2017 2034   GLUCOSEU NEGATIVE 12/16/2016 1444   HGBUR NEGATIVE 06/27/2017 2034  BILIRUBINUR NEGATIVE 05/29/2017 2034   KETONESUR NEGATIVE 05/29/2017 2034   PROTEINUR NEGATIVE 05/29/2017 2034   UROBILINOGEN 0.2 12/16/2016 1444   NITRITE NEGATIVE 05/29/2017 2034   LEUKOCYTESUR TRACE (A) 05/29/2017 2034   Sepsis Labs: Invalid input(s): PROCALCITONIN, LACTICIDVEN  No results found for this or any previous visit (from the past 240 hour(s)).    Radiology Studies: Dg Chest 2 View  Result Date: 05/29/2017 CLINICAL DATA:  Chest pain for 5 days. Personal history of lung carcinoma. COPD. EXAM: CHEST  2 VIEW COMPARISON:  05/19/2017 FINDINGS: The heart size and mediastinal contours are within normal limits. Aortic atherosclerosis. Severe emphysema again demonstrated as well as bibasilar scarring. No evidence of pulmonary infiltrate or edema. No evidence of pneumothorax or pleural effusion. The visualized skeletal structures are unremarkable. Diffuse pancreatic calcification again seen, consistent with chronic pancreatitis. IMPRESSION: Emphysema and bibasilar scarring.  No active lung disease. Electronically Signed   By: Earle Gell M.D.   On: 05/29/2017 15:46   Ct Renal Stone Study  Result Date: 05/29/2017 CLINICAL DATA:  Chronic back  pain with new LEFT flank pain for 3 days. Suspect kidney stones. History of lung cancer. EXAM: CT ABDOMEN AND PELVIS WITHOUT CONTRAST TECHNIQUE: Multidetector CT imaging of the abdomen and pelvis was performed following the standard protocol without IV contrast. COMPARISON:  Abdominal radiograph Feb 16, 2017 and PET-CT May 14, 2016 and CT chest May 27, 2016 FINDINGS: LOWER CHEST: Centrilobular emphysema. LEFT lower lobe bronchiectasis with unchanged mucoid impaction. Smaller approximate 18 mm consolidation LEFT lower lobe at site of prior biopsy. No pleural effusion. Bilateral small fat containing diaphragmatic hernias. Heart size is normal. No pericardial effusions. Mild gas distended distal esophagus associated with reflux disease. HEPATOBILIARY: Nodular liver contour consistent with cirrhosis. Noncontrast CT may be insensitive for small tumor. Status post cholecystectomy. PANCREAS: Diffusely calcified pancreas compatible chronic pancreatitis. No acute component. SPLEEN: Normal. ADRENALS/URINARY TRACT: Kidneys are orthotopic, demonstrating normal size and morphology. No nephrolithiasis, hydronephrosis; limited assessment for renal masses on this nonenhanced examination. 2.3 cm RIGHT lower pole cyst, homogeneously hypodense and hypometabolic by prior PET-CT. The unopacified ureters are normal in course and caliber. Urinary bladder is well distended and unremarkable. Normal adrenal glands. STOMACH/BOWEL: The stomach, small and large bowel are normal in course and caliber without inflammatory changes, sensitivity decreased by lack of enteric contrast. Mild sigmoid colonic diverticulosis. Normal appendix. VASCULAR/LYMPHATIC: Aortoiliac vessels are normal in course and caliber. Moderate calcific atherosclerosis. No lymphadenopathy by CT size criteria. REPRODUCTIVE: 17 mm subserosal fundal leiomyoma versus homogeneously hypodense LEFT adnexae. OTHER: No intraperitoneal free fluid or free air. MUSCULOSKELETAL: New  mild T11 compression fracture. Osteopenia. Cachexia. Mild broad dextroscoliosis. L5-S1 disc prosthesis with arthrodesis. IMPRESSION: 1. No urolithiasis, obstructive uropathy nor acute intra-abdominal/pelvic process. 2. New age indeterminate mild T11 compression fracture. 3. Cirrhosis without CT findings of portal hypertension. 4. Chronic pancreatitis. Aortic Atherosclerosis (ICD10-I70.0) and Emphysema (ICD10-J43.9). Electronically Signed   By: Elon Alas M.D.   On: 05/29/2017 16:22     Scheduled Meds: . aspirin EC  81 mg Oral Daily  . carvedilol  6.25 mg Oral BID WC  . enoxaparin (LOVENOX) injection  40 mg Subcutaneous Q24H  . fentaNYL  50 mcg Transdermal Q72H  . gabapentin  300 mg Oral BID  . Glycopyrrolate  1 Act Inhalation BID  . magnesium oxide  400 mg Oral TID PC  . mirtazapine  7.5 mg Oral QHS  . polyethylene glycol  17 g Oral Daily   Continuous Infusions: . 0.9 %  NaCl with KCl 40 mEq / L 125 mL/hr (05/30/17 0145)    Time spent: 35 minutes, more than 50% bedside and long discussion with the patient and patient's husband regarding her home pain medication use (or misuse), as well as discussed outside the room about husband's main concerns in patient's home habits regarding ibuprofen use.  Marzetta Board, MD, PhD [facility] Pager [phone]  If 7PM-7AM, please contact night-coverage [email or web address] Password [facility] 05/30/2017, 11:31 AM

## 2017-05-30 NOTE — Progress Notes (Signed)
Patient not putting out large amounts of urine while voiding, per nurse tech.  Bladder scan performed with >500 cc's found.  Dr. Cruzita Lederer notfied, with order received for in and out cath.

## 2017-05-31 DIAGNOSIS — B182 Chronic viral hepatitis C: Secondary | ICD-10-CM | POA: Diagnosis present

## 2017-05-31 DIAGNOSIS — E1142 Type 2 diabetes mellitus with diabetic polyneuropathy: Secondary | ICD-10-CM | POA: Diagnosis present

## 2017-05-31 DIAGNOSIS — E871 Hypo-osmolality and hyponatremia: Secondary | ICD-10-CM | POA: Diagnosis present

## 2017-05-31 DIAGNOSIS — F418 Other specified anxiety disorders: Secondary | ICD-10-CM | POA: Diagnosis present

## 2017-05-31 DIAGNOSIS — F3189 Other bipolar disorder: Secondary | ICD-10-CM | POA: Diagnosis present

## 2017-05-31 DIAGNOSIS — E876 Hypokalemia: Secondary | ICD-10-CM | POA: Diagnosis present

## 2017-05-31 DIAGNOSIS — J449 Chronic obstructive pulmonary disease, unspecified: Secondary | ICD-10-CM | POA: Diagnosis not present

## 2017-05-31 DIAGNOSIS — C3432 Malignant neoplasm of lower lobe, left bronchus or lung: Secondary | ICD-10-CM | POA: Diagnosis present

## 2017-05-31 DIAGNOSIS — I2510 Atherosclerotic heart disease of native coronary artery without angina pectoris: Secondary | ICD-10-CM | POA: Diagnosis present

## 2017-05-31 DIAGNOSIS — N179 Acute kidney failure, unspecified: Secondary | ICD-10-CM | POA: Diagnosis not present

## 2017-05-31 DIAGNOSIS — R0602 Shortness of breath: Secondary | ICD-10-CM | POA: Diagnosis not present

## 2017-05-31 DIAGNOSIS — K219 Gastro-esophageal reflux disease without esophagitis: Secondary | ICD-10-CM | POA: Diagnosis present

## 2017-05-31 DIAGNOSIS — I5032 Chronic diastolic (congestive) heart failure: Secondary | ICD-10-CM | POA: Diagnosis not present

## 2017-05-31 DIAGNOSIS — C7931 Secondary malignant neoplasm of brain: Secondary | ICD-10-CM | POA: Diagnosis not present

## 2017-05-31 DIAGNOSIS — E440 Moderate protein-calorie malnutrition: Secondary | ICD-10-CM | POA: Insufficient documentation

## 2017-05-31 DIAGNOSIS — D899 Disorder involving the immune mechanism, unspecified: Secondary | ICD-10-CM | POA: Diagnosis present

## 2017-05-31 DIAGNOSIS — C3490 Malignant neoplasm of unspecified part of unspecified bronchus or lung: Secondary | ICD-10-CM | POA: Diagnosis not present

## 2017-05-31 DIAGNOSIS — E1165 Type 2 diabetes mellitus with hyperglycemia: Secondary | ICD-10-CM | POA: Diagnosis not present

## 2017-05-31 DIAGNOSIS — M47814 Spondylosis without myelopathy or radiculopathy, thoracic region: Secondary | ICD-10-CM | POA: Diagnosis not present

## 2017-05-31 DIAGNOSIS — K861 Other chronic pancreatitis: Secondary | ICD-10-CM | POA: Diagnosis present

## 2017-05-31 DIAGNOSIS — I110 Hypertensive heart disease with heart failure: Secondary | ICD-10-CM | POA: Diagnosis present

## 2017-05-31 DIAGNOSIS — M5126 Other intervertebral disc displacement, lumbar region: Secondary | ICD-10-CM | POA: Diagnosis not present

## 2017-05-31 DIAGNOSIS — E872 Acidosis: Secondary | ICD-10-CM | POA: Diagnosis present

## 2017-05-31 DIAGNOSIS — F1120 Opioid dependence, uncomplicated: Secondary | ICD-10-CM | POA: Diagnosis present

## 2017-05-31 DIAGNOSIS — G8929 Other chronic pain: Secondary | ICD-10-CM | POA: Diagnosis present

## 2017-05-31 DIAGNOSIS — J9601 Acute respiratory failure with hypoxia: Secondary | ICD-10-CM | POA: Diagnosis present

## 2017-05-31 DIAGNOSIS — M810 Age-related osteoporosis without current pathological fracture: Secondary | ICD-10-CM | POA: Diagnosis present

## 2017-05-31 DIAGNOSIS — E039 Hypothyroidism, unspecified: Secondary | ICD-10-CM | POA: Diagnosis present

## 2017-05-31 LAB — GLUCOSE, CAPILLARY
GLUCOSE-CAPILLARY: 155 mg/dL — AB (ref 65–99)
GLUCOSE-CAPILLARY: 161 mg/dL — AB (ref 65–99)
GLUCOSE-CAPILLARY: 99 mg/dL (ref 65–99)
Glucose-Capillary: 122 mg/dL — ABNORMAL HIGH (ref 65–99)

## 2017-05-31 LAB — BASIC METABOLIC PANEL
ANION GAP: 6 (ref 5–15)
BUN: 20 mg/dL (ref 6–20)
CHLORIDE: 113 mmol/L — AB (ref 101–111)
CO2: 18 mmol/L — AB (ref 22–32)
Calcium: 8.7 mg/dL — ABNORMAL LOW (ref 8.9–10.3)
Creatinine, Ser: 0.7 mg/dL (ref 0.44–1.00)
GFR calc non Af Amer: >60 mL/min (ref >60)
Glucose, Bld: 102 mg/dL — ABNORMAL HIGH (ref 65–99)
POTASSIUM: 4.9 mmol/L (ref 3.5–5.1)
SODIUM: 137 mmol/L (ref 135–145)

## 2017-05-31 LAB — MAGNESIUM: MAGNESIUM: 1.4 mg/dL — AB (ref 1.7–2.4)

## 2017-05-31 MED ORDER — MAGNESIUM SULFATE 2 GM/50ML IV SOLN
2.0000 g | Freq: Once | INTRAVENOUS | Status: AC
  Administered 2017-05-31: 2 g via INTRAVENOUS
  Filled 2017-05-31: qty 50

## 2017-05-31 NOTE — Progress Notes (Signed)
PROGRESS NOTE  Phyllis [name] ZSW:[id number] DOB: [birth year] DOA: 05/29/2017 PCP: [name], MD   LOS: 0 days   Brief Narrative / Interim history: 61 year old female with hep C, hypothyroidism, metastatic lung cancer currently getting radiation therapy, COPD, pulmonary hypertension on chronic home oxygen, diastolic CHF, who was admitted on 8/26 with poor p.o. intake, acute renal failure and hypokalemia. 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  Bladder scan last night showed greater than 500 cc in the bladder after voiding.  She got an I&O cath, failed to be able to void again overnight, and had a Foley catheter placed this morning 8/28.  She is also been reporting progressive worsening low back pain.  She denies any weakness or numbness.  Given history of cancer, will obtain MRI to rule out neurologic causes.  For now keep the Foley  Acute kidney injury -Likely in the setting of poor p.o. intake as well as her home diuretics, also contributing is her urinary retention at home -Creatinine has now normalized with decompression and IV fluids.  Decreased rate of fluids  today -Mild acidosis persists with a bicarb of 18 today  Hyponatremia/hypokalemia -Likely in the setting of dehydration, sodium and potassium are improving with fluids and potassium supplementations, -Continue IV fluids as above -Sodium is now normalized, potassium is normalized as well -Replete magnesium, 1.4 this morning  Flank pain -Patient with long-standing history of flank pain and back pain on multiple pain medications at home including fentanyl patch as well as oxycodone. -The husband who is very concerned and tells me that patient is taking more than prescribed in terms of oxycodone as well as her patches.  He is also mentioning that she is consuming large amounts of ibuprofen as well, and he fears that she has become dependent on narcotics.  -CT scan on admission without acute findings, MRI as above pending  Hypothyroidism -TSH 3.0, continue home Synthroid  Type 2 diabetes mellitus -Hold oral hypoglycemics, place on sliding scale.  Most recent A1c last month shows good control at 6.3  Anxiety -Xanax as needed  Severe protein calorie malnutrition -In the setting of metastatic lung disease  Chronic diastolic CHF -She appears hypovolemic, continue fluids.  Most recent echo in April 2018 with 60-65% EF, grade 1 diastolic dysfunction.  Continue Coreg.  Thrombocytopenia -Chronic and stable.  No bleeding  Lung cancer with brain metastases -She is getting outpatient radiation therapy -Husband is telling me that he would be interested in hospice however patient herself does not want to talk to the palliative care.  He would appreciate help at home, however understands that while she is getting radiation therapy hospice cannot be set up at this point  COPD -Stable, no wheezing, continue home oxygen   DVT prophylaxis: Lovenox Code Status: Full code Family Communication: husband at bedside 8/27 Disposition Plan: home 1-2 dyas  Consultants:   None   Procedures:   None    Antimicrobials:  None    Subjective: - no chest pain, shortness of breath, no abdominal pain, nausea or vomiting.   Objective: Vitals:   05/30/17 0518 05/30/17 1358 05/30/17 2154 05/31/17 0527  BP: (!) 157/74 109/60 135/72 (!) 103/54  Pulse: 71 69 72 69  Resp: _0 Temp: 97.7 F (36.5 C) 97.9 F (36.6 C) 98 F (36.7 C) 98.5 F (36.9 C)  TempSrc: Oral Oral Oral Oral  SpO2: 98% 100% 100% 100%  Height:        Intake/Output Summary (Last 24 hours) at 05/31/17 1216 Last data filed at 05/31/17 1033  Gross per 24 hour  Intake           826.67 ml  Output             1125 ml  Net          -298.33 ml   There were no vitals filed for this visit.  Examination:  Vitals:   05/30/17 0518 05/30/17 1358 05/30/17 2154 05/31/17 0527  BP: (!) 157/74 109/60 135/72 (!) 103/54  Pulse: 71 69 72 69  Resp: _1 Temp: 97.7 F (36.5 C) 97.9 F (36.6 C) 98 F (36.7 C) 98.5 F (36.9 C)  TempSrc: Oral Oral Oral Oral  SpO2: 98% 100% 100% 100%  Height:        Constitutional: NAD, calm, comfortable Eyes: PERRL, lids and conjunctivae normal ENMT: Mucous membranes are moist.  Poor dentition Neck: normal, supple Respiratory: clear to auscultation bilaterally, no wheezing, no crackles. Normal respiratory effort.  Cardiovascular: Regular rate and rhythm, no murmurs / rubs / gallops. No LE edema. 2+ pedal pulses.  Abdomen: no tenderness. Bowel sounds positive.  Skin: no rashes, lesions, ulcers. No induration Neurologic: non focal    Data Reviewed: I have independently reviewed following labs and imaging studies    CBC:  Recent Labs Lab 06-10-2017 1653  WBC 8.8  NEUTROABS 6.2  HGB 17.5*  HCT 48.0*  MCV 85.9  PLT 675   Basic Metabolic Panel:  Recent Labs Lab 10-Jun-2017 1653 2017-06-10 2230 05/30/17 1023 05/31/17 0558  NA 130*  --  133* 137  K 2.8*  --  3.0* 4.9  CL 96*  --  106 113*  CO2 17*  --  18* 18*  GLUCOSE 107*  --  110* 102*  BUN 37*  --  27* 20   CREATININE 1.48*  --  1.11* 0.70  CALCIUM 8.1*  --  8.6* 8.7*  MG  --  1.8  --  1.4*   GFR: Estimated Creatinine Clearance: 62.6 mL/min (by C-G formula based on SCr of 0.7 mg/dL). Liver Function Tests:  Recent Labs Lab 06-10-17 1653  AST 23  ALT 11*  ALKPHOS 80  BILITOT 0.9  PROT 7.9  ALBUMIN 4.5   No results for input(s): LIPASE, AMYLASE in the last 168 hours. No results for input(s): AMMONIA in the last 168 hours. Coagulation Profile: No results for input(s): INR, PROTIME in the last 168 hours. Cardiac Enzymes: No results for input(s): CKTOTAL, CKMB, CKMBINDEX, TROPONINI in the last 168 hours. BNP (last 3 results) No results for input(s): PROBNP in the last 8760 hours. HbA1C: No results for input(s): HGBA1C in the last  72 hours. CBG:  Recent Labs Lab 05/30/17 1209 05/30/17 1708 05/30/17 2213 05/31/17 0722 05/31/17 1115  GLUCAP 161* 71 166* 155* 161*   Lipid Profile: No results for input(s): CHOL, HDL, LDLCALC, TRIG, CHOLHDL, LDLDIRECT in the last 72 hours. Thyroid Function Tests: No results for input(s): TSH, T4TOTAL, FREET4, T3FREE, THYROIDAB in the last 72 hours. Anemia Panel: No results for input(s): VITAMINB12, FOLATE, FERRITIN, TIBC, IRON, RETICCTPCT in the last 72 hours. Urine analysis:    Component Value Date/Time   COLORURINE YELLOW 05/29/2017 2034   APPEARANCEUR CLEAR 05/29/2017 2034   LABSPEC 1.010 05/29/2017 2034   PHURINE 6.0 05/29/2017 2034   GLUCOSEU NEGATIVE 05/29/2017 2034   GLUCOSEU NEGATIVE 12/16/2016 1444   HGBUR NEGATIVE 05/29/2017 2034   BILIRUBINUR NEGATIVE 05/29/2017 2034   KETONESUR NEGATIVE 05/29/2017 2034   PROTEINUR NEGATIVE 05/29/2017 2034   UROBILINOGEN 0.2 12/16/2016 1444   NITRITE NEGATIVE 05/29/2017 2034   LEUKOCYTESUR TRACE (A) 05/29/2017 2034   Sepsis Labs: Invalid input(s): PROCALCITONIN, LACTICIDVEN  No results found for this or any previous visit (from the past 240 hour(s)).    Radiology Studies: Dg Chest 2  View  Result Date: 05/29/2017 CLINICAL DATA:  Chest pain for 5 days. Personal history of lung carcinoma. COPD. EXAM: CHEST  2 VIEW COMPARISON:  05/19/2017 FINDINGS: The heart size and mediastinal contours are within normal limits. Aortic atherosclerosis. Severe emphysema again demonstrated as well as bibasilar scarring. No evidence of pulmonary infiltrate or edema. No evidence of pneumothorax or pleural effusion. The visualized skeletal structures are unremarkable. Diffuse pancreatic calcification again seen, consistent with chronic pancreatitis. IMPRESSION: Emphysema and bibasilar scarring.  No active lung disease. Electronically Signed   By: Earle Gell M.D.   On: 05/29/2017 15:46   Ct Renal Stone Study  Result Date: 05/29/2017 CLINICAL DATA:  Chronic back pain with new LEFT flank pain for 3 days. Suspect kidney stones. History of lung cancer. EXAM: CT ABDOMEN AND PELVIS WITHOUT CONTRAST TECHNIQUE: Multidetector CT imaging of the abdomen and pelvis was performed following the standard protocol without IV contrast. COMPARISON:  Abdominal radiograph Feb 16, 2017 and PET-CT May 14, 2016 and CT chest May 27, 2016 FINDINGS: LOWER CHEST: Centrilobular emphysema. LEFT lower lobe bronchiectasis with unchanged mucoid impaction. Smaller approximate 18 mm consolidation LEFT lower lobe at site of prior biopsy. No pleural effusion. Bilateral small fat containing diaphragmatic hernias. Heart size is normal. No pericardial effusions. Mild gas distended distal esophagus associated with reflux disease. HEPATOBILIARY: Nodular liver contour consistent with cirrhosis. Noncontrast CT may be insensitive for small tumor. Status post cholecystectomy. PANCREAS: Diffusely calcified pancreas compatible chronic pancreatitis. No acute component. SPLEEN: Normal. ADRENALS/URINARY TRACT: Kidneys are orthotopic, demonstrating normal size and morphology. No nephrolithiasis, hydronephrosis; limited assessment for renal masses on this  nonenhanced examination. 2.3 cm RIGHT lower pole cyst, homogeneously hypodense and hypometabolic by prior PET-CT. The unopacified ureters are normal in course and caliber. Urinary bladder is well distended and unremarkable. Normal adrenal glands. STOMACH/BOWEL: The stomach, small and large bowel are normal in course and caliber without inflammatory changes, sensitivity decreased by lack of enteric contrast. Mild sigmoid colonic diverticulosis. Normal appendix. VASCULAR/LYMPHATIC: Aortoiliac vessels are normal in course and caliber. Moderate calcific atherosclerosis. No lymphadenopathy by CT size criteria. REPRODUCTIVE: 17 mm subserosal fundal leiomyoma versus homogeneously hypodense LEFT adnexae. OTHER: No intraperitoneal free fluid or free air. MUSCULOSKELETAL: New mild T11 compression fracture. Osteopenia. Cachexia. Mild broad dextroscoliosis. L5-S1 disc prosthesis with arthrodesis. IMPRESSION: 1. No urolithiasis, obstructive uropathy nor acute intra-abdominal/pelvic process.  2. New age indeterminate mild T11 compression fracture. 3. Cirrhosis without CT findings of portal hypertension. 4. Chronic pancreatitis. Aortic Atherosclerosis (ICD10-I70.0) and Emphysema (ICD10-J43.9). Electronically Signed   By: Elon Alas M.D.   On: 05/29/2017 16:22     Scheduled Meds: . aspirin EC  81 mg Oral Daily  . carvedilol  6.25 mg Oral BID WC  . enoxaparin (LOVENOX) injection  40 mg Subcutaneous Q24H  . fentaNYL  50 mcg Transdermal Q72H  . gabapentin  300 mg Oral BID  . Glycopyrrolate  1 Act Inhalation BID  . insulin aspart  0-9 Units Subcutaneous TID WC  . magnesium oxide  400 mg Oral TID PC  . mirtazapine  7.5 mg Oral QHS  . multivitamin with minerals  1 tablet Oral Daily  . polyethylene glycol  17 g Oral Daily   Continuous Infusions: . lactated ringers 100 mL/hr at 05/30/17 2151     Marzetta Board, MD, PhD [facility] Pager [phone]  If 7PM-7AM, please contact  night-coverage [email or web address] Password [email or web address] 05/31/2017, 12:16 PM

## 2017-05-31 NOTE — Progress Notes (Signed)
Patient unable to void any significant amount.  Patient bladder scanned with 413 cc's found, after patient had attempted void.  Order received for indwelling catheter.

## 2017-06-01 ENCOUNTER — Inpatient Hospital Stay (HOSPITAL_COMMUNITY): Payer: Medicare Other

## 2017-06-01 LAB — BASIC METABOLIC PANEL
ANION GAP: 4 — AB (ref 5–15)
BUN: 12 mg/dL (ref 6–20)
CALCIUM: 8.8 mg/dL — AB (ref 8.9–10.3)
CHLORIDE: 109 mmol/L (ref 101–111)
CO2: 22 mmol/L (ref 22–32)
Creatinine, Ser: 0.51 mg/dL (ref 0.44–1.00)
GFR calc non Af Amer: >60 mL/min (ref >60)
GLUCOSE: 127 mg/dL — AB (ref 65–99)
POTASSIUM: 4.3 mmol/L (ref 3.5–5.1)
Sodium: 135 mmol/L (ref 135–145)

## 2017-06-01 LAB — CBC
HEMATOCRIT: 39.9 % (ref 36.0–46.0)
HEMOGLOBIN: 13.8 g/dL (ref 12.0–15.0)
MCH: 30.7 pg (ref 26.0–34.0)
MCHC: 34.6 g/dL (ref 30.0–36.0)
MCV: 88.7 fL (ref 78.0–100.0)
PLATELETS: 189 10*3/uL (ref 150–400)
RBC: 4.5 MIL/uL (ref 3.87–5.11)
RDW: 14.4 % (ref 11.5–15.5)
WBC: 5.8 10*3/uL (ref 4.0–10.5)

## 2017-06-01 LAB — GLUCOSE, CAPILLARY
Glucose-Capillary: 115 mg/dL — ABNORMAL HIGH (ref 65–99)
Glucose-Capillary: 168 mg/dL — ABNORMAL HIGH (ref 65–99)
Glucose-Capillary: 155 mg/dL — ABNORMAL HIGH (ref 65–99)
Glucose-Capillary: 245 mg/dL — ABNORMAL HIGH (ref 65–99)

## 2017-06-01 LAB — MAGNESIUM: Magnesium: 1.7 mg/dL (ref 1.7–2.4)

## 2017-06-01 MED ORDER — ALBUTEROL SULFATE (2.5 MG/3ML) 0.083% IN NEBU
2.5000 mg | INHALATION_SOLUTION | Freq: Four times a day (QID) | RESPIRATORY_TRACT | Status: DC | PRN
  Administered 2017-06-01 – 2017-06-02 (×2): 2.5 mg via RESPIRATORY_TRACT
  Filled 2017-06-01 (×2): qty 3

## 2017-06-01 MED ORDER — SENNOSIDES-DOCUSATE SODIUM 8.6-50 MG PO TABS
1.0000 | ORAL_TABLET | Freq: Two times a day (BID) | ORAL | Status: DC
  Administered 2017-06-01 – 2017-06-06 (×11): 1 via ORAL
  Filled 2017-06-01 (×11): qty 1

## 2017-06-01 MED ORDER — TAMSULOSIN HCL 0.4 MG PO CAPS
0.4000 mg | ORAL_CAPSULE | Freq: Every day | ORAL | Status: DC
  Administered 2017-06-01 – 2017-06-06 (×6): 0.4 mg via ORAL
  Filled 2017-06-01 (×6): qty 1

## 2017-06-01 MED ORDER — MORPHINE SULFATE (PF) 2 MG/ML IV SOLN
2.0000 mg | INTRAVENOUS | Status: DC | PRN
  Administered 2017-06-01 (×2): 2 mg via INTRAVENOUS
  Filled 2017-06-01 (×2): qty 1

## 2017-06-01 MED ORDER — GADOBENATE DIMEGLUMINE 529 MG/ML IV SOLN
10.0000 mL | Freq: Once | INTRAVENOUS | Status: AC | PRN
  Administered 2017-06-01: 10 mL via INTRAVENOUS

## 2017-06-01 MED ORDER — OXYCODONE HCL 5 MG PO TABS
5.0000 mg | ORAL_TABLET | Freq: Three times a day (TID) | ORAL | Status: DC | PRN
  Administered 2017-06-01 – 2017-06-04 (×7): 5 mg via ORAL
  Filled 2017-06-01 (×7): qty 1

## 2017-06-01 NOTE — Progress Notes (Addendum)
PROGRESS NOTE  Phyllis [name] RAQ:[id number] DOB: 02/10/1956 DOA: 05/29/2017 PCP: [name], MD   LOS: 1 day   Brief Narrative / Interim history: 61 year old female with hep C, hypothyroidism, metastatic lung cancer currently getting radiation therapy, COPD, pulmonary hypertension on chronic home oxygen, diastolic CHF, who was admitted on 8/26 with poor p.o. intake, acute renal failure and hypokalemia. 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  Bladder scan last night showed greater than 500 cc in the bladder after voiding.  She got an I&O cath, failed to be able to void again overnight, and had a Foley catheter placed this morning 8/28.  She is also been reporting progressive worsening low back pain.  She denies any weakness or numbness.   -MRI pending.  Start Flomax.    Acute kidney injury -Likely in the setting of poor p.o. intake as well as her home diuretics, also contributing is her urinary retention at home -Creatinine has now normalized with decompression and IV fluids.  -continue with IV fluids.  Acidosis resolved.   Hyponatremia/hypokalemia -mg normalized.     Flank pain, Back pain  -Patient with long-standing history of flank pain and back pain on multiple pain medications at home including fentanyl patch as well as oxycodone. -The husband who is very concerned and tells me that patient is taking more than prescribed in terms of oxycodone as well as her patches.  He is also mentioning that she is consuming large amounts of ibuprofen as well, and he fears that she has become dependent on narcotics.  -CT scan on admission without acute findings, MRI as above pending MRI thoracic and lumbar spine pending.  Resume home oxycodone. Continue with IV morphine.   Hypothyroidism -TSH 3.0, continue home Synthroid  Type 2 diabetes mellitus -Hold oral hypoglycemics, place on sliding scale.  Most recent A1c last month shows good control at 6.3  Anxiety -Xanax as needed  Severe protein calorie malnutrition -In the setting of metastatic lung disease  Chronic diastolic CHF -She appears hypovolemic, continue fluids.  Most recent echo in April 2018 with 60-65% EF, grade 1 diastolic dysfunction.  Continue Coreg.  Thrombocytopenia -Chronic and stable.  No bleeding  Lung cancer with brain metastases -She was  getting outpatient radiation therapy. Now on surveillance for brain mets. Discussed with radiation oncologist nurse.  -Dr Julien Nordmann added to treatment team.   COPD -Stable, no wheezing, continue home oxygen   DVT prophylaxis: Lovenox Code Status: Full code Family Communication: no family at bedside.  Disposition Plan: home 1-2 dyas  Consultants:   None   Procedures:   None   Antimicrobials:  None    Subjective: She is still having significant pain. Back  pain.  She takes oxycodone at home.   Objective: Vitals:   05/31/17 2030 06/01/17 0500 06/01/17 0817 06/01/17 1413  BP: 127/65 131/66  127/68  Pulse: 79 76  73  Resp: _0 Temp: 98.8 F (37.1 C) 98 F (36.7 C)  97.9 F (36.6 C)  TempSrc: Oral Axillary  Oral  SpO2:  100% 96%  100%  Weight:   52.9 kg (116 lb 11.2 oz)   Height:   _1  (1.651 m)     Intake/Output Summary (Last 24 hours) at 06/01/17 1830 Last data filed at 06/01/17 1800  Gross per 24 hour  Intake          1158.33 ml  Output              950 ml  Net           208.33 ml   Filed Weights   06/01/17 0817  Weight: 52.9 kg (116 lb 11.2 oz)    Examination:  Vitals:   05/31/17 2030 06/01/17 0500 06/01/17 0817 06/01/17 1413  BP: 127/65 131/66  127/68  Pulse: 79 76  73  Resp: _2 Temp: 98.8 F (37.1 C) 98 F (36.7 C)  97.9 F (36.6 C)  TempSrc: Oral Axillary  Oral  SpO2: 100% 96%  100%  Weight:   52.9 kg (116 lb 11.2 oz)   Height:   _3  (1.651 m)     Constitutional: NAD ENMT: poor dentition, MMM Neck: supple./  Respiratory: CTA. Normal respiratory effort.  Cardiovascular: S 1, S 2 RRR Abdomen: BS present, soft, nt Skin: no rashes, lesions, ulcers. No induration Neurologic: non focal    Data Reviewed: I have independently reviewed following labs and imaging studies    CBC:  Recent Labs Lab May 30, 2017 1653 06/01/17 0533  WBC 8.8 5.8  NEUTROABS 6.2  --   HGB 17.5* 13.8  HCT 48.0* 39.9  MCV 85.9 88.7  PLT 225 150   Basic Metabolic Panel:  Recent Labs Lab May 30, 2017 1653 30-May-2017 2230 05/30/17 1023 05/31/17 0558 06/01/17 0533  NA 130*  --  133* 137 135  K 2.8*  --  3.0* 4.9 4.3  CL 96*  --  106 113* 109  CO2 17*  --  18* 18* 22  GLUCOSE 107*  --  110* 102* 127*  BUN 37*  --  27* 20 12  CREATININE 1.48*  --  1.11* 0.70 0.51  CALCIUM 8.1*  --  8.6* 8.7* 8.8*  MG  --  1.8  --  1.4* 1.7   GFR: Estimated Creatinine Clearance: 60.9 mL/min (by C-G formula based on SCr of 0.51 mg/dL). Liver Function Tests:  Recent Labs Lab 05/30/17 1653  AST 23  ALT 11*  ALKPHOS 80  BILITOT 0.9  PROT 7.9  ALBUMIN 4.5   No results for input(s): LIPASE, AMYLASE in the last 168 hours. No results for input(s): AMMONIA in the last 168 hours. Coagulation  Profile: No results for input(s): INR, PROTIME in the last 168 hours. Cardiac Enzymes: No results for input(s): CKTOTAL, CKMB, CKMBINDEX, TROPONINI in the last 168 hours. BNP (last 3 results) No results for input(s): PROBNP in the last 8760 hours. HbA1C: No results for input(s): HGBA1C in the last 72 hours. CBG:  Recent Labs Lab 05/31/17 1652 05/31/17 2043 06/01/17 0736 06/01/17 1117 06/01/17 1714  GLUCAP 122* 99 115* 168* 155*   Lipid Profile: No results for input(s): CHOL, HDL, LDLCALC, TRIG, CHOLHDL, LDLDIRECT  in the last 72 hours. Thyroid Function Tests: No results for input(s): TSH, T4TOTAL, FREET4, T3FREE, THYROIDAB in the last 72 hours. Anemia Panel: No results for input(s): VITAMINB12, FOLATE, FERRITIN, TIBC, IRON, RETICCTPCT in the last 72 hours. Urine analysis:    Component Value Date/Time   COLORURINE YELLOW 05/29/2017 2034   APPEARANCEUR CLEAR 05/29/2017 2034   LABSPEC 1.010 05/29/2017 2034   PHURINE 6.0 05/29/2017 2034   GLUCOSEU NEGATIVE 05/29/2017 2034   GLUCOSEU NEGATIVE 12/16/2016 1444   HGBUR NEGATIVE 05/29/2017 2034   BILIRUBINUR NEGATIVE 05/29/2017 2034   KETONESUR NEGATIVE 05/29/2017 2034   PROTEINUR NEGATIVE 05/29/2017 2034   UROBILINOGEN 0.2 12/16/2016 1444   NITRITE NEGATIVE 05/29/2017 2034   LEUKOCYTESUR TRACE (A) 05/29/2017 2034   Sepsis Labs: Invalid input(s): PROCALCITONIN, LACTICIDVEN  No results found for this or any previous visit (from the past 240 hour(s)).    Radiology Studies: No results found.   Scheduled Meds: . aspirin EC  81 mg Oral Daily  . carvedilol  6.25 mg Oral BID WC  . enoxaparin (LOVENOX) injection  40 mg Subcutaneous Q24H  . fentaNYL  50 mcg Transdermal Q72H  . gabapentin  300 mg Oral BID  . Glycopyrrolate  1 Act Inhalation BID  . insulin aspart  0-9 Units Subcutaneous TID WC  . magnesium oxide  400 mg Oral TID PC  . mirtazapine  7.5 mg Oral QHS  . multivitamin with minerals  1 tablet Oral Daily  .  polyethylene glycol  17 g Oral Daily  . senna-docusate  1 tablet Oral BID  . tamsulosin  0.4 mg Oral Daily   Continuous Infusions: . lactated ringers 50 mL/hr at 05/31/17 [place].  [facility] Pager [phone]  If 7PM-7AM, please contact night-coverage [email or web address] Password [email or web address] 06/01/2017, 6:30 PM

## 2017-06-01 NOTE — Progress Notes (Signed)
Physical Therapy Treatment Patient Details Name: Phyllis [name] MRN: [id number] DOB: [birth year] Today's Date: 06/01/2017    History of Present Illness 61 year old female with hep C, hypothyroidism, metastatic lung cancer currently getting radiation therapy, COPD, pulmonary hypertension on chronic home oxygen, diastolic CHF, who was admitted on 8/26 with poor p.o. intake, acute renal failure and hypokalemia.    PT Comments    Pt is much weaker and more lethargic/sleepy today compared to previous PT session; she is requiring more assist with basic functional task--recommend SNF based on pt status today;   pt  With c/o of severe pain and when asked about Palliative Care pt states she is agreeable to a Palliative Care Consult--chart states pt previous refused (?)   Follow Up Recommendations  SNF;Supervision/Assistance - 24 hour     Equipment Recommendations  Rolling walker with 5" wheels    Recommendations for Other Services       Precautions / Restrictions Precautions Precautions: Fall Restrictions Weight Bearing Restrictions: No    Mobility  Bed Mobility Overal bed mobility: Needs Assistance Bed Mobility: Supine to Sit;Sit to Supine     Supine to sit: Min guard Sit to supine: Min guard   General bed mobility comments: for safety, cues to complete task  Transfers Overall transfer level: Needs assistance Equipment used: Rolling walker (2 wheeled) Transfers: Sit to/from Stand Sit to Stand: Min assist         General transfer comment: cues and assist for safety and balance on standing  Ambulation/Gait Ambulation/Gait assistance: Min assist Ambulation Distance (Feet): 22 Feet (amb in room, pt declined to go into hallway) Assistive device: Rolling walker (2 wheeled) Gait Pattern/deviations: Step-through pattern;Decreased stride length;Trunk flexed;Narrow base of support     General Gait Details: heavy min assist for balance andto maneuver RW; pt is extremely  unsteady this session and would likely fall without support and mutli-moal cues for safety   Stairs            Wheelchair Mobility    Modified Rankin (Stroke Patients Only)       Balance   Sitting-balance support: Bilateral upper extremity supported;Feet supported Sitting balance-Leahy Scale: Fair       Standing balance-Leahy Scale: Poor Standing balance comment: heavily reliant on UEs for balance                             Cognition Arousal/Alertness: Awake/alert (arouses but falls asleep easily) Behavior During Therapy: WFL for tasks assessed/performed Overall Cognitive Status: Impaired/Different from baseline Area of Impairment: Safety/judgement;Following commands                       Following Commands: Follows one step commands with increased time;Follows multi-step commands inconsistently Safety/Judgement: Decreased awareness of deficits            Exercises General Exercises - Lower Extremity Ankle Circles/Pumps: AROM;Both;5 reps Long Arc Quad: AROM;Strengthening;Both;5 reps    General Comments        Pertinent Vitals/Pain Pain Assessment: Faces Faces Pain Scale: Hurts whole lot Pain Location: back Pain Descriptors / Indicators: Grimacing;Moaning Pain Intervention(s): Limited activity within patient's tolerance;Monitored during session;Repositioned    Home Living                      Prior Function            PT Goals (current goals can now be found in the care plan  section) Acute Rehab PT Goals PT Goal Formulation: With patient Time For Goal Achievement: 06/06/17 Potential to Achieve Goals: Fair Progress towards PT goals: Progressing toward goals    Frequency    Min 2X/week      PT Plan Current plan remains appropriate;Discharge plan needs to be updated    Co-evaluation              AM-PAC PT "6 Clicks" Daily Activity  Outcome Measure  Difficulty turning over in bed (including adjusting  bedclothes, sheets and blankets)?: Unable Difficulty moving from lying on back to sitting on the side of the bed? : Unable Difficulty sitting down on and standing up from a chair with arms (e.g., wheelchair, bedside commode, etc,.)?: Unable Help needed moving to and from a bed to chair (including a wheelchair)?: A Little Help needed walking in hospital room?: A Little Help needed climbing 3-5 steps with a railing? : A Lot 6 Click Score: 11    End of Session Equipment Utilized During Treatment: Gait belt Activity Tolerance: Patient limited by fatigue;Patient limited by lethargy Patient left: in bed;with call bell/phone within reach;with bed alarm set   PT Visit Diagnosis: Unsteadiness on feet (R26.81);Difficulty in walking, not elsewhere classified (R26.2);Muscle weakness (generalized) (M62.81);Pain Pain - part of body:  (back)     Time: 2248-2500 PT Time Calculation (min) (ACUTE ONLY): 18 min  Charges:  $Gait Training: 8-22 mins                    G Codes:          Alem Fahl Jun 20, 2017, 3:02 PM

## 2017-06-02 ENCOUNTER — Inpatient Hospital Stay (HOSPITAL_COMMUNITY): Payer: Medicare Other

## 2017-06-02 LAB — BLOOD GAS, ARTERIAL
Acid-base deficit: 1 mmol/L (ref 0.0–2.0)
Bicarbonate: 25.1 mmol/L (ref 20.0–28.0)
Drawn by: 257701
O2 Content: 2 L/min
O2 Saturation: 94.4 %
Patient temperature: 98.6
pCO2 arterial: 50.4 mmHg — ABNORMAL HIGH (ref 32.0–48.0)
pH, Arterial: 7.318 — ABNORMAL LOW (ref 7.350–7.450)
pO2, Arterial: 74.7 mmHg — ABNORMAL LOW (ref 83.0–108.0)

## 2017-06-02 LAB — GLUCOSE, CAPILLARY
GLUCOSE-CAPILLARY: 146 mg/dL — AB (ref 65–99)
Glucose-Capillary: 138 mg/dL — ABNORMAL HIGH (ref 65–99)
Glucose-Capillary: 209 mg/dL — ABNORMAL HIGH (ref 65–99)
Glucose-Capillary: 580 mg/dL (ref 65–99)
Glucose-Capillary: 498 mg/dL — ABNORMAL HIGH (ref 65–99)

## 2017-06-02 MED ORDER — PANTOPRAZOLE SODIUM 40 MG IV SOLR
40.0000 mg | Freq: Two times a day (BID) | INTRAVENOUS | Status: DC
  Administered 2017-06-02 – 2017-06-03 (×3): 40 mg via INTRAVENOUS
  Filled 2017-06-02 (×3): qty 40

## 2017-06-02 MED ORDER — METHYLPREDNISOLONE SODIUM SUCC 125 MG IJ SOLR
60.0000 mg | Freq: Two times a day (BID) | INTRAMUSCULAR | Status: DC
  Administered 2017-06-02 – 2017-06-04 (×5): 60 mg via INTRAVENOUS
  Filled 2017-06-02 (×5): qty 2

## 2017-06-02 MED ORDER — FUROSEMIDE 10 MG/ML IJ SOLN
20.0000 mg | Freq: Once | INTRAMUSCULAR | Status: AC
  Administered 2017-06-02: 20 mg via INTRAVENOUS
  Filled 2017-06-02: qty 2

## 2017-06-02 MED ORDER — POTASSIUM CHLORIDE CRYS ER 20 MEQ PO TBCR
40.0000 meq | EXTENDED_RELEASE_TABLET | Freq: Once | ORAL | Status: AC
  Administered 2017-06-02: 40 meq via ORAL
  Filled 2017-06-02: qty 2

## 2017-06-02 MED ORDER — ALBUTEROL SULFATE (2.5 MG/3ML) 0.083% IN NEBU: 2.5000 mg | INHALATION_SOLUTION | Freq: Four times a day (QID) | RESPIRATORY_TRACT | Status: DC

## 2017-06-02 MED ORDER — DEXTROSE 5 % IV SOLN
500.0000 mg | INTRAVENOUS | Status: DC
  Administered 2017-06-02 – 2017-06-06 (×5): 500 mg via INTRAVENOUS
  Filled 2017-06-02 (×5): qty 500

## 2017-06-02 MED ORDER — IPRATROPIUM-ALBUTEROL 0.5-2.5 (3) MG/3ML IN SOLN
3.0000 mL | Freq: Four times a day (QID) | RESPIRATORY_TRACT | Status: DC
  Administered 2017-06-02 – 2017-06-04 (×9): 3 mL via RESPIRATORY_TRACT
  Filled 2017-06-02 (×9): qty 3

## 2017-06-02 MED ORDER — MORPHINE SULFATE (PF) 2 MG/ML IV SOLN: 1.0000 mg | INTRAVENOUS | Status: DC | PRN

## 2017-06-02 MED ORDER — IPRATROPIUM-ALBUTEROL 0.5-2.5 (3) MG/3ML IN SOLN: 3.0000 mL | Freq: Four times a day (QID) | RESPIRATORY_TRACT | Status: DC

## 2017-06-02 MED ORDER — IPRATROPIUM BROMIDE 0.02 % IN SOLN: 0.5000 mg | Freq: Four times a day (QID) | RESPIRATORY_TRACT | Status: DC

## 2017-06-02 MED ORDER — FUROSEMIDE 10 MG/ML IJ SOLN
40.0000 mg | Freq: Once | INTRAMUSCULAR | Status: AC
  Administered 2017-06-02: 40 mg via INTRAVENOUS
  Filled 2017-06-02: qty 4

## 2017-06-02 MED ORDER — INSULIN ASPART 100 UNIT/ML ~~LOC~~ SOLN
8.0000 [IU] | Freq: Once | SUBCUTANEOUS | Status: AC
  Administered 2017-06-02: 8 [IU] via SUBCUTANEOUS

## 2017-06-02 NOTE — Progress Notes (Signed)
PROGRESS NOTE  Phyllis [name] OTR:[id number] DOB: 01/04/1956 DOA: 05/29/2017 PCP: [name], MD   LOS: 2 days   Brief Narrative / Interim history: 61 year old female with hep C, hypothyroidism, metastatic lung cancer currently getting radiation therapy, COPD, pulmonary hypertension on chronic home oxygen, diastolic CHF, who was admitted on 8/26 with poor p.o. intake, acute renal failure and hypokalemia. 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  Bladder scan last night showed greater than 500 cc in the bladder after voiding.  She got an I&O cath, failed to be able to void again overnight, and had a Foley catheter placed this morning 8/28.  She is also been reporting progressive worsening low back pain.  She denies any weakness or numbness.   Started  Flomax.    Acute kidney injury -Likely in the setting of poor p.o. intake as well as her home diuretics, also contributing is her urinary retention at home -Creatinine has now normalized with decompression and IV fluids.  -NSL.  Acidosis resolved.   Hyponatremia/hypokalemia -mg normalized.    Flank pain, Back pain    -Patient with long-standing history of flank pain and back pain on multiple pain medications at home including fentanyl patch as well as oxycodone. -The husband who is very concerned and tells me that patient is taking more than prescribed in terms of oxycodone as well as her patches.  He is also mentioning that she is consuming large amounts of ibuprofen as well, and he fears that she has become dependent on narcotics.  -CT scan on admission without acute findings, MRI as above pending MRI thoracic and lumbar spine showed T 8 compression deformity, edema, also compression T T2, T3, T4, and T11 mild superior endplate depressions with edema and enhancement indicating recent injury. -Neurosurgery Consulted.  -oxycodone PRN.   Encephalopathy;  Patient was more sleepy today. ABG not significant elevated PCO2.  IV morphine discontinue,  Monitor low dose oxycodone.  Came back to check on patient and she was more alert.   Acute hypoxic respiratory Failure;  She has Bilateral wheezing.  Will schedule nebulizer.  Reflux medication.  Chest x ray with atypical infection. Start azithromycin  Will give another dose of lasix.   Hypothyroidism -TSH 3.0, continue home Synthroid  Type 2 diabetes mellitus -Hold oral hypoglycemics, place on sliding scale.  Most recent A1c last month shows good control at 6.3  Anxiety -Xanax as needed  Severe protein calorie malnutrition -In the setting of metastatic lung disease  Chronic diastolic CHF -She appears hypovolemic, continue fluids.  Most recent echo in April 2018 with 60-65% EF, grade 1 diastolic dysfunction.  Continue Coreg.  Thrombocytopenia -Chronic and stable.  No bleeding  Lung  cancer with brain metastases -She was  getting outpatient radiation therapy. Now on surveillance for brain mets. Discussed with radiation oncologist nurse.  -Dr Julien Nordmann added to treatment team.   COPD -Stable, no wheezing, continue home oxygen   DVT prophylaxis:  Lovenox Code Status: Full code Family Communication: no family at bedside.  Disposition Plan: home 1-2 dyas  Consultants:   None   Procedures:   None   Antimicrobials:  None    Subjective: She is still having significant pain. Back pain.  She takes oxycodone at home.   Objective: Vitals:   06/02/17 0351 06/02/17 1137 06/02/17 1300 06/02/17 1500  BP: 131/73  (!) 107/53 (!) 107/53  Pulse: (!) 110  82 79  Resp: _0 Temp: 98.9 F (37.2 C)  98.7 F (37.1 C) 99.2 F (37.3 C)  TempSrc: Axillary  Oral Oral  SpO2: 95% 94% 100% 98%  Weight:      Height:        Intake/Output Summary (Last 24 hours) at 06/02/17 1607 Last data filed at 06/02/17 7793  Gross per 24 hour  Intake              500 ml  Output             2400 ml  Net            -1900 ml   Filed Weights   06/01/17 0817  Weight: 52.9 kg (116 lb 11.2 oz)    Examination:  Vitals:   06/02/17 0351 06/02/17 1137 06/02/17 1300 06/02/17 1500  BP: 131/73  (!) 107/53 (!) 107/53  Pulse: (!) 110  82 79  Resp: _1 Temp: 98.9 F (37.2 C)  98.7 F (37.1 C) 99.2 F (37.3 C)  TempSrc: Axillary  Oral Oral  SpO2: 95% 94% 100% 98%  Weight:      Height:        Constitutional: sleepy, NAD ENMT: poor dentition, MMM Neck: supple./  Respiratory: Bilateral wheezing.  Cardiovascular: S 1, S 2 RRR Abdomen: BS present, soft, nt Skin: no rash, no lesions  Extremities. Bilateral weakness, likely effort related.     Data Reviewed: I have independently reviewed following labs and imaging studies    CBC:  Recent Labs Lab 2017/06/10 1653 06/01/17 0533  WBC 8.8 5.8  NEUTROABS 6.2  --   HGB 17.5* 13.8  HCT 48.0* 39.9  MCV 85.9 88.7  PLT 225 903   Basic Metabolic Panel:  Recent Labs Lab 06/10/17 1653 Jun 10, 2017 2230 05/30/17 1023 05/31/17 0558 06/01/17 0533  NA 130*  --  133* 137 135  K 2.8*  --  3.0* 4.9 4.3  CL 96*  --  106 113* 109  CO2 17*  --  18* 18* 22  GLUCOSE 107*  --  110* 102* 127*   BUN 37*  --  27* 20 12  CREATININE 1.48*  --  1.11* 0.70 0.51  CALCIUM 8.1*  --  8.6* 8.7* 8.8*  MG  --  1.8  --  1.4* 1.7   GFR: Estimated Creatinine Clearance: 60.9 mL/min (by C-G formula based on SCr of 0.51 mg/dL). Liver Function Tests:  Recent Labs Lab 06-10-2017 1653  AST 23  ALT 11*  ALKPHOS 80  BILITOT 0.9  PROT 7.9  ALBUMIN 4.5   No results for input(s): LIPASE, AMYLASE in the last 168 hours. No results for input(s): AMMONIA in the last 168 hours. Coagulation Profile: No results for input(s): INR,  PROTIME in the last 168 hours. Cardiac Enzymes: No results for input(s): CKTOTAL, CKMB, CKMBINDEX, TROPONINI in the last 168 hours. BNP (last 3 results) No results for input(s): PROBNP in the last 8760 hours. HbA1C: No results for input(s): HGBA1C in the last 72 hours. CBG:  Recent Labs Lab 06/01/17 1117 06/01/17 1714 06/01/17 2224 06/02/17 0803 06/02/17 1226  GLUCAP 168* 155* 245* 138* 146*   Lipid Profile: No results for input(s): CHOL, HDL, LDLCALC, TRIG, CHOLHDL, LDLDIRECT in the last 72 hours. Thyroid Function Tests: No results for input(s): TSH, T4TOTAL, FREET4, T3FREE, THYROIDAB in the last 72 hours. Anemia Panel: No results for input(s): VITAMINB12, FOLATE, FERRITIN, TIBC, IRON, RETICCTPCT in the last 72 hours. Urine analysis:    Component Value Date/Time   COLORURINE YELLOW 05/29/2017 2034   APPEARANCEUR CLEAR 05/29/2017 2034   LABSPEC 1.010 05/29/2017 2034   PHURINE 6.0 05/29/2017 2034   GLUCOSEU NEGATIVE 05/29/2017 2034   GLUCOSEU NEGATIVE 12/16/2016 1444   HGBUR NEGATIVE 05/29/2017 2034   BILIRUBINUR NEGATIVE 05/29/2017 2034   KETONESUR NEGATIVE 05/29/2017 2034   PROTEINUR NEGATIVE 05/29/2017 2034   UROBILINOGEN 0.2 12/16/2016 1444   NITRITE NEGATIVE 05/29/2017 2034   LEUKOCYTESUR TRACE (A) 05/29/2017 2034   Sepsis Labs: Invalid input(s): PROCALCITONIN, LACTICIDVEN  No results found for this or any previous visit (from the past 240  hour(s)).    Radiology Studies: Mr Thoracic Spine W Wo Contrast  Result Date: 06/01/2017 CLINICAL DATA:  61 y/o F; metastatic lung cancer and worsening lower back pain. Acute urinary retention. EXAM: MRI THORACIC AND LUMBAR SPINE WITHOUT AND WITH CONTRAST TECHNIQUE: Multiplanar and multiecho pulse sequences of the thoracic and lumbar spine were obtained without and with intravenous contrast. CONTRAST:  43m MULTIHANCE GADOBENATE DIMEGLUMINE 529 MG/ML IV SOLN COMPARISON:  03/29/2017 CT of the abdomen and pelvis. 05/02/2013 lumbar spine MRI. 04/24/2017 and 03/10/2017 CT of the chest. FINDINGS: MRI THORACIC SPINE FINDINGS Moderate motion artifact. Alignment:  Physiologic. Vertebrae: Moderate 50% T8 vertebral body compression deformity with edema and enhancement. T2, T3, T4, and T11 mild superior endplate depressions with edema and enhancement. Chronic mild superior endplate deformities of the T5 through T7 vertebral bodies. No expansile mass lesion is identified. Cord: Normal signal and morphology. No abnormal enhancement identified. No epidural disease identified. Paraspinal and other soft tissues: Right azygoesophageal recess soft tissue lesion measuring 20 x 28 mm, probably lymphadenopathy, mildly increased in size from prior CT of chest. Disc levels: No significant disc displacement, foraminal stenosis, or canal stenosis. MRI LUMBAR SPINE FINDINGS Moderate motion artifact. Segmentation:  Standard. Alignment:  Physiologic. Vertebrae: The L5-S1 interbody fusion prosthesis produces susceptibility artifact partially obscuring the adjacent vertebral bodies and locally disturbance fat suppression. No bone marrow edema or abnormal enhancement. The Conus medullaris: Extends to the L1 level and appears normal. No abnormal enhancement identified. No epidural disease identified. Paraspinal and other soft tissues: Postsurgical changes within paraspinal muscles and subcutaneous fat related to left-sided L5-S1 laminectomy  and disc replacement. A discrete fluid collection. Disc levels: L1-2: No significant disc displacement, foraminal narrowing, or canal stenosis. L2-3: Minimal disc bulge. No significant foraminal or canal stenosis. L3-4: Minimal disc bulge. No significant foraminal or canal stenosis. L4-5: Small disc bulge with mild facet hypertrophy. Mild bilateral foraminal and lateral recess stenosis. No significant canal stenosis. L5-S1: Discectomy. Mild facet hypertrophy. No significant foraminal or canal stenosis. IMPRESSION: 1. Moderate motion artifact of thoracic and lumbar spine MRI. 2. Moderate T8 vertebral body compression deformity with edema and enhancement indicating recent injury.  3. T2, T3, T4, and T11 mild superior endplate depressions with edema and enhancement indicating recent injury. 4. No expansile mass lesion to suggest bony metastatic disease, although pathologic fracture deformity is possible in the T8 vertebral body which is diffusely enhancing. Follow-up is recommended. 5. No bony retropulsion or significant thoracic spinal canal stenosis. 6. No abnormal signal or enhancement of the thoracic and lumbar spinal cord or the cauda equina. 7. No epidural disease identified. 8. Mild lower lumbar spine degenerative changes without significant foraminal narrowing or canal stenosis. 9. Right azygoesophageal recess soft tissue lesion, probably lymphadenopathy, appears mildly increased in size from prior CT of chest given differences in technique. Electronically Signed   By: Kristine Garbe M.D.   On: 06/01/2017 19:54   Mr Lumbar Spine W Wo Contrast  Result Date: 06/01/2017 CLINICAL DATA:  61 y/o F; metastatic lung cancer and worsening lower back pain. Acute urinary retention. EXAM: MRI THORACIC AND LUMBAR SPINE WITHOUT AND WITH CONTRAST TECHNIQUE: Multiplanar and multiecho pulse sequences of the thoracic and lumbar spine were obtained without and with intravenous contrast. CONTRAST:  68m MULTIHANCE  GADOBENATE DIMEGLUMINE 529 MG/ML IV SOLN COMPARISON:  03/29/2017 CT of the abdomen and pelvis. 05/02/2013 lumbar spine MRI. 04/24/2017 and 03/10/2017 CT of the chest. FINDINGS: MRI THORACIC SPINE FINDINGS Moderate motion artifact. Alignment:  Physiologic. Vertebrae: Moderate 50% T8 vertebral body compression deformity with edema and enhancement. T2, T3, T4, and T11 mild superior endplate depressions with edema and enhancement. Chronic mild superior endplate deformities of the T5 through T7 vertebral bodies. No expansile mass lesion is identified. Cord: Normal signal and morphology. No abnormal enhancement identified. No epidural disease identified. Paraspinal and other soft tissues: Right azygoesophageal recess soft tissue lesion measuring 20 x 28 mm, probably lymphadenopathy, mildly increased in size from prior CT of chest. Disc levels: No significant disc displacement, foraminal stenosis, or canal stenosis. MRI LUMBAR SPINE FINDINGS Moderate motion artifact. Segmentation:  Standard. Alignment:  Physiologic. Vertebrae: The L5-S1 interbody fusion prosthesis produces susceptibility artifact partially obscuring the adjacent vertebral bodies and locally disturbance fat suppression. No bone marrow edema or abnormal enhancement. The Conus medullaris: Extends to the L1 level and appears normal. No abnormal enhancement identified. No epidural disease identified. Paraspinal and other soft tissues: Postsurgical changes within paraspinal muscles and subcutaneous fat related to left-sided L5-S1 laminectomy and disc replacement. A discrete fluid collection. Disc levels: L1-2: No significant disc displacement, foraminal narrowing, or canal stenosis. L2-3: Minimal disc bulge. No significant foraminal or canal stenosis. L3-4: Minimal disc bulge. No significant foraminal or canal stenosis. L4-5: Small disc bulge with mild facet hypertrophy. Mild bilateral foraminal and lateral recess stenosis. No significant canal stenosis. L5-S1:  Discectomy. Mild facet hypertrophy. No significant foraminal or canal stenosis. IMPRESSION: 1. Moderate motion artifact of thoracic and lumbar spine MRI. 2. Moderate T8 vertebral body compression deformity with edema and enhancement indicating recent injury. 3. T2, T3, T4, and T11 mild superior endplate depressions with edema and enhancement indicating recent injury. 4. No expansile mass lesion to suggest bony metastatic disease, although pathologic fracture deformity is possible in the T8 vertebral body which is diffusely enhancing. Follow-up is recommended. 5. No bony retropulsion or significant thoracic spinal canal stenosis. 6. No abnormal signal or enhancement of the thoracic and lumbar spinal cord or the cauda equina. 7. No epidural disease identified. 8. Mild lower lumbar spine degenerative changes without significant foraminal narrowing or canal stenosis. 9. Right azygoesophageal recess soft tissue lesion, probably lymphadenopathy, appears mildly increased in size from  prior CT of chest given differences in technique. Electronically Signed   By: Kristine Garbe M.D.   On: 06/01/2017 19:54   Dg Chest Port 1 View  Result Date: 06/02/2017 CLINICAL DATA:  Increasing shortness of breath, chest congestion. EXAM: PORTABLE CHEST 1 VIEW COMPARISON:  Chest radiograph May 29, 2017 FINDINGS: Cardiac silhouette is mildly enlarged. Calcified aortic knob. Diffuse interstitial prominence, confluent in the lung bases without pleural effusion or focal consolidation. No pneumothorax. Surgical clips in the included right abdomen compatible with cholecystectomy. Coarse calcifications in the upper abdomen are likely pancreatic. IMPRESSION: COPD with increasing bibasilar interstitial prominence, possible atypical infection. LEFT lung base atelectasis/scarring. Electronically Signed   By: Elon Alas M.D.   On: 06/02/2017 05:19     Scheduled Meds: . aspirin EC  81 mg Oral Daily  . carvedilol  6.25 mg  Oral BID WC  . enoxaparin (LOVENOX) injection  40 mg Subcutaneous Q24H  . fentaNYL  50 mcg Transdermal Q72H  . gabapentin  300 mg Oral BID  . insulin aspart  0-9 Units Subcutaneous TID WC  . ipratropium-albuterol  3 mL Nebulization Q6H  . magnesium oxide  400 mg Oral TID PC  . methylPREDNISolone (SOLU-MEDROL) injection  60 mg Intravenous Q12H  . mirtazapine  7.5 mg Oral QHS  . multivitamin with minerals  1 tablet Oral Daily  . pantoprazole (PROTONIX) IV  40 mg Intravenous Q12H  . polyethylene glycol  17 g Oral Daily  . senna-docusate  1 tablet Oral BID  . tamsulosin  0.4 mg Oral Daily   Continuous Infusions: . azithromycin Stopped (06/02/17 1244)     Tityana Pagan.  [facility] Pager [phone]  If 7PM-7AM, please contact night-coverage [email or web address] Password [email or web address] 06/02/2017, 4:07 PM

## 2017-06-02 NOTE — Progress Notes (Signed)
Callback from [place] advising that orders have been placed for CXR and IV Lasix. Advised if patient becomes more lethargic or more of a change to notify her back. Will c/t monitor.

## 2017-06-02 NOTE — Progress Notes (Signed)
Patient given bath and is more short of breath/wheezing and seems more lethargic. Is arousable and answers questions when asked where she is, states hospital and states still having some pain. Vitals are stable, sats 95% on 2L.  Albuterol breathing treatment given. RT called to room to evaluate further. HOB elevated. Will c/t monitor and await RT.

## 2017-06-02 NOTE — Progress Notes (Signed)
Patient given IV Lasix per orders. Xray arrived to do CXR. Patient breathing is more regular, wheezing has improved. Patient does wake up and answer where she is, who she is and other questions. Will c/t monitor status for any further changes.

## 2017-06-02 NOTE — Progress Notes (Signed)
This RT called to pt bedside for pt assessment.  Pt noted for upperairway wheezes and wheezes throughout lung fields.  HR104, [id number], spo2 100% on 2lnc.  RN recently administered a prn albuterol neb tx.  Pt appears somewhat lethargic, but will wake up, answer questions appropriately, then fall right back to sleep.  RN placed call to MD for further orders.  RT will continue to monitor and assess as needed.

## 2017-06-02 NOTE — Care Management Note (Signed)
Case Management Note  Patient Details  Name: Phyllis [name] MRN: [id number] Date of Birth: [birth year]  Subjective/Objective:                  aki and pulmonary edema responsive to iv lasix  Action/Plan: Date:  June 02, 2017 Chart reviewed for concurrent status and case management needs. Will continue to follow patient progress. Discharge Planning: following for needs Expected discharge date: [id number] Velva Harman, BSN, [place], [place]  Expected Discharge Date:                  Expected Discharge Plan:  Home/Self Care  In-House Referral:     Discharge planning Services  CM Consult  Post Acute Care Choice:  Resumption of Svcs/PTA Provider Choice offered to:     DME Arranged:    DME Agency:     HH Arranged:    [place] Agency:     Status of Service:  In process, will continue to follow  If discussed at Long Length of Stay Meetings, dates discussed:    Additional Comments:  Leeroy Cha, RN 06/02/2017, 9:21 AM

## 2017-06-02 NOTE — Progress Notes (Signed)
Spoke with RT, advised that since breathing tx completed and pt still wheezing significantly and seems to be change since earlier that she can't do much else but suggest CXR and blood gas. Provider on call paged for further orders. Charge nurse in room and aware as well.  Will c/t monitor.

## 2017-06-02 NOTE — Progress Notes (Signed)
Patient resting much more comfortably, wheezing has improved significantly. Patient more alert. Output since Lasix was 960m. Patient states she is feeling better. Will c/t monitor.

## 2017-06-02 NOTE — Progress Notes (Signed)
I was called by medicine service regarding the MRI findings. Briefly, she has a history of metastatic lung CA admitted with AKI and had urinary retention. MRI was ordered as workup of the retention which demonstrated diffuse endplate fractures of the thoracic spine, and T8 compression deformity. I have reviewed the MRI T/L spine which does show multiple minor endplate fractures, and a more significant T8 compression fracture. There is no identifiable bony retropulsion or epidural tumor causing any stenosis or spinal cord compression. Would manage this fracture conservatively for now. Can consider a TLSO brace if she has back pain. I will plan on seeing her in the outpatient clinic in 2 weeks at which time we can consider biopsy/vertebral augmentation.

## 2017-06-03 LAB — BASIC METABOLIC PANEL
Anion gap: 6 (ref 5–15)
BUN: 17 mg/dL (ref 6–20)
CO2: 26 mmol/L (ref 22–32)
CREATININE: 0.68 mg/dL (ref 0.44–1.00)
Calcium: 9.2 mg/dL (ref 8.9–10.3)
Chloride: 103 mmol/L (ref 101–111)
GFR calc Af Amer: >60 mL/min (ref >60)
GFR calc non Af Amer: >60 mL/min (ref >60)
GLUCOSE: 117 mg/dL — AB (ref 65–99)
POTASSIUM: 4.5 mmol/L (ref 3.5–5.1)
Sodium: 135 mmol/L (ref 135–145)

## 2017-06-03 LAB — CBC
HEMATOCRIT: 36 % (ref 36.0–46.0)
HEMOGLOBIN: 12.4 g/dL (ref 12.0–15.0)
MCH: 31 pg (ref 26.0–34.0)
MCHC: 34.4 g/dL (ref 30.0–36.0)
MCV: 90 fL (ref 78.0–100.0)
Platelets: 192 10*3/uL (ref 150–400)
RBC: 4 MIL/uL (ref 3.87–5.11)
RDW: 14.2 % (ref 11.5–15.5)
WBC: 12.1 10*3/uL — ABNORMAL HIGH (ref 4.0–10.5)

## 2017-06-03 LAB — GLUCOSE, RANDOM: GLUCOSE: 393 mg/dL — AB (ref 65–99)

## 2017-06-03 LAB — GLUCOSE, CAPILLARY
GLUCOSE-CAPILLARY: 357 mg/dL — AB (ref 65–99)
GLUCOSE-CAPILLARY: 424 mg/dL — AB (ref 65–99)
GLUCOSE-CAPILLARY: 137 mg/dL — AB (ref 65–99)
GLUCOSE-CAPILLARY: 238 mg/dL — AB (ref 65–99)
Glucose-Capillary: 160 mg/dL — ABNORMAL HIGH (ref 65–99)
Glucose-Capillary: 157 mg/dL — ABNORMAL HIGH (ref 65–99)

## 2017-06-03 MED ORDER — PANTOPRAZOLE SODIUM 40 MG PO TBEC
40.0000 mg | DELAYED_RELEASE_TABLET | Freq: Two times a day (BID) | ORAL | Status: DC
  Administered 2017-06-03 – 2017-06-06 (×6): 40 mg via ORAL
  Filled 2017-06-03 (×6): qty 1

## 2017-06-03 MED ORDER — FUROSEMIDE 10 MG/ML IJ SOLN
20.0000 mg | Freq: Once | INTRAMUSCULAR | Status: AC
  Administered 2017-06-03: 20 mg via INTRAVENOUS
  Filled 2017-06-03: qty 2

## 2017-06-03 MED ORDER — INSULIN GLARGINE 100 UNIT/ML ~~LOC~~ SOLN
12.0000 [IU] | Freq: Every day | SUBCUTANEOUS | Status: DC
  Administered 2017-06-03 – 2017-06-05 (×3): 12 [IU] via SUBCUTANEOUS
  Filled 2017-06-03 (×3): qty 0.12

## 2017-06-03 MED ORDER — CARVEDILOL 3.125 MG PO TABS
3.1250 mg | ORAL_TABLET | Freq: Two times a day (BID) | ORAL | Status: DC
  Administered 2017-06-03 – 2017-06-06 (×6): 3.125 mg via ORAL
  Filled 2017-06-03 (×6): qty 1

## 2017-06-03 NOTE — Progress Notes (Signed)
Nutrition Follow-up  DOCUMENTATION CODES:   Non-severe (moderate) malnutrition in context of chronic illness  INTERVENTION:   Magic cup TID with meals, each supplement provides 290 kcal and 9 grams of protein  MVI daily  NUTRITION DIAGNOSIS:   Malnutrition (moderate/non-severe) related to chronic illness, catabolic illness, cancer and cancer related treatments, poor appetite as evidenced by moderate depletions of muscle mass, moderate depletion of body fat, energy intake < 75% for > 7 days.  Ongoing  GOAL:   Patient will meet greater than or equal to 90% of their needs  Progressing   MONITOR:   PO intake, Supplement acceptance, Weight trends, Labs  REASON FOR ASSESSMENT:   Malnutrition Screening Tool    ASSESSMENT:   61 y.o. 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 Patient presented with complaints of 9/10 non-radiating left flank pain of 4 days duration. She endorses poor urine output over the past few days. Patient also endorses poor by mouth intake over the past 5 days, with nausea but no vomiting. Last bowel movement was 3-4 days ago and was normal in consistency. She last took her blood pressure medications 3 days ago.  Spoke with pt at bedside. Pt appetite progressing slowly back to baseline per reports. Meal completion average of 75% for last eight meals. Pt eating magic cups BID and would like to continue them. Obtained bed wt upon visit. Pt up three lbs since admission at 119 lb. Will continue to encourage PO intake and monitor supplement acceptance.   Medications reviewed and include: fentanyl, SSI, mag oxide, solumedrol, MVI with minerals, IV abx Labs reviewed: CBG 117  Diet Order:  Diet Heart Room service appropriate? Yes; Fluid consistency: Thin  Skin:  Reviewed, no issues  Last BM:  06/02/17  Height:   Ht Readings from Last 1  Encounters:  06/01/17 _0  (1.651 m)    Weight:   Wt Readings from Last 1 Encounters:  06/01/17 116 lb 11.2 oz (52.9 kg)    Ideal Body Weight:  56.82 kg  BMI:  Body mass index is 19.42 kg/m.  Estimated Nutritional Needs:   Kcal:  1635-1905 (30-35 kcal/kg)  Protein:  80-90 grams  Fluid:  >/= 1.8 L/day  EDUCATION NEEDS:   No education needs identified at this time  [place], LDN Clinical Nutrition Pager # - [phone]

## 2017-06-03 NOTE — Care Management Important Message (Signed)
Important Message  Patient Details  Name: Phyllis [name] MRN: [id number] Date of Birth: 30-Jan-1956   Medicare Important Message Given:  Yes    Kerin Salen 06/03/2017, 10:05 AMImportant Message  Patient Details  Name: Phyllis [name] MRN: [id number] Date of Birth: [birth year]   Medicare Important Message Given:  Yes    Kerin Salen 06/03/2017, 10:05 AM

## 2017-06-03 NOTE — Progress Notes (Signed)
Inpatient Diabetes Program Recommendations  AACE/ADA: New Consensus Statement on Inpatient Glycemic Control (2015)  Target Ranges:  Prepandial:   less than 140 mg/dL      Peak postprandial:   less than 180 mg/dL (1-2 hours)      Critically ill patients:  140 - 180 mg/dL   Lab Results  Component Value Date   GLUCAP 424 (H) 06/03/2017   HGBA1C 6.3 (H) 82/70/7867   JQGB2E DUPLICATE REQUEST 07/10/1218    Review of Glycemic Control Results for Phyllis [name], Phyllis [name] (MRN [id number]) as of 06/03/2017 12:42  Ref. Range 06/02/2017 23:11 06/03/2017 01:18 06/03/2017 04:01 06/03/2017 07:25 06/03/2017 11:35  Glucose-Capillary Latest Ref Range: 65 - 99 mg/dL 498 (H) 357 (H) 160 (H) 157 (H) 424 (H)   Diabetes history: No hx noted Current orders for Inpatient glycemic control: Novolog 0-9 units tid  Inpatient Diabetes Program Recommendations:  Noted hyperglycemia post steroid administration. While on steroids, please consider: -Increase Novolog correction to moderate scale tid + 0-5 hs -Novolog 4 units tid meal coverage if eats 50% meals Will follow.  Thank you, Nani Gasser. Jonet Mathies, RN, MSN, CDE  Diabetes Coordinator Inpatient Glycemic Control Team Team Pager [phone] (8am-5pm) 06/03/2017 12:45 PM

## 2017-06-03 NOTE — Progress Notes (Signed)
PROGRESS NOTE  Phyllis [name] NOB:[id number] DOB: [birth year] DOA: 05/29/2017 PCP: [name], MD   LOS: 3 days   Brief Narrative / Interim history: 61 year old female with hep C, hypothyroidism, metastatic lung cancer currently getting radiation therapy, COPD, pulmonary hypertension on chronic home oxygen, diastolic CHF, who was admitted on 8/26 with poor p.o. intake, acute renal failure and hypokalemia. 8/28 -acute kidney injury resolved, patient developed acute urinary retention  Assessment & Plan: Principal Problem:   AKI (acute kidney injury) ([place]) Active Problems:   Hep C w/o coma, chronic ([place])   Hypothyroidism   Depression with anxiety   CAD (coronary artery disease), native coronary artery   COPD (chronic obstructive pulmonary disease) with chronic bronchitis (HCC)   Pulmonary hypertension due to COPD ([place])   Type II diabetes mellitus with manifestations ([place])   Squamous cancer of left lower lobe of lung (HCC)   Chronic diastolic CHF (congestive heart failure) (HCC)   Brain metastasis (HCC)   Opiate dependence, continuous ([place])   Malnutrition of moderate degree   Acute urinary retention -Patient initially did not mention this, however later revealed that she has been having difficulties urinating at home on and off for the past month.  Bladder scan last night showed greater than 500 cc in the bladder after voiding.  She got an I&O cath, failed to be able to void again overnight, and had a Foley catheter placed this morning 8/28.  She is also been reporting progressive worsening low back pain.  She denies any weakness or numbness.   Started  Flomax.  She will need voiding trial.   Acute kidney injury -Likely in the setting of poor p.o. intake as well as her home diuretics, also contributing is her urinary retention at home -Creatinine has now normalized with decompression and IV fluids.  -NSL.  Acidosis resolved.   Hyponatremia/hypokalemia -mg normalized.     Flank pain, Back pain  -Patient with long-standing history of flank pain and back pain on multiple pain medications at home including fentanyl patch as well as oxycodone. -The husband who is very concerned and tells me that patient is taking more than prescribed in terms of oxycodone as well as her patches.  He is also mentioning that she is consuming large amounts of ibuprofen as well, and he fears that she has become dependent on narcotics.  -CT scan on admission without acute findings, MRI as above pending MRI thoracic and lumbar spine showed T 8 compression deformity, edema, also compression T T2, T3, T4, and T11 mild superior endplate depressions with edema and enhancement indicating recent injury. -Neurosurgery Consulted. Plan for brace and follow up out patient in 2 weeks for evaluation for biopsy and vertebral Augmentation.  -oxycodone PRN.   Encephalopathy;  Patient was more sleepy today. ABG not significant elevated PCO2.  IV morphine discontinue,  Monitor low dose oxycodone.  Improved.   Acute hypoxic respiratory Failure;  She has Bilateral wheezing. Continue wit steroids.  Continue with schedule nebulizer.  Reflux medication.  Chest x ray with atypical infection. Continue with  azithromycin  Continue with lasix.   Hypothyroidism -TSH 3.0, continue home Synthroid  Type 2 diabetes mellitus -Hold oral hypoglycemics, place on sliding scale.  Most recent A1c last month shows good control at 6.3 Hyperglycemia in setting of steroid.s  SSI.   Anxiety -Xanax as needed  Severe protein calorie malnutrition -In the setting of metastatic lung disease  Chronic diastolic CHF -She appears hypovolemic, continue fluids.  Most recent echo  in April 2018 with 60-65% EF, grade 1 diastolic dysfunction.  Continue Coreg.  Thrombocytopenia -Chronic and stable.  No bleeding  Lung cancer with brain metastases -She was  getting outpatient radiation therapy. Now on surveillance for  brain mets. Discussed with radiation oncologist nurse.  -Dr Julien Nordmann added to treatment team.   COPD -Stable, no wheezing, continue home oxygen   DVT prophylaxis: Lovenox Code Status: Full code Family Communication: no family at bedside.  Disposition Plan: home 1-2 dyas  Consultants:   None   Procedures:   None   Antimicrobials:  None    Subjective: Dyspnea improved, but still with significant wheezing. Not feeling well.   Objective: Vitals:   06/03/17 1150 06/03/17 1155 06/03/17 1416 06/03/17 1616  BP:   128/72   Pulse:   78   Resp:   16   Temp:   (!) 97.5 F (36.4 C)   TempSrc:   Oral   SpO2:  93% 100% 100%  Weight: 54 kg (119 lb)     Height:        Intake/Output Summary (Last 24 hours) at 06/03/17 1647 Last data filed at 06/03/17 1600  Gross per 24 hour  Intake              860 ml  Output             2300 ml  Net            -1440 ml   Filed Weights   06/01/17 0817 06/03/17 1150  Weight: 52.9 kg (116 lb 11.2 oz) 54 kg (119 lb)    Examination:  Vitals:   06/03/17 1150 06/03/17 1155 06/03/17 1416 06/03/17 1616  BP:   128/72   Pulse:   78   Resp:   16   Temp:   (!) 97.5 F (36.4 C)   TempSrc:   Oral   SpO2:  93% 100% 100%  Weight: 54 kg (119 lb)     Height:        Constitutional: NAD, more alert.  ENMT: poor dentition, MMM Neck: Supple  Respiratory: Bilateral wheezing. Normal respiratory effort.  Cardiovascular: S 1, S 2 RRR Abdomen: BS present, soft, nt Skin: no rash, no lesions  Extremities. No edema     Data Reviewed: I have independently reviewed following labs and imaging studies    CBC:  Recent Labs Lab 05-Jun-2017 1653 06/01/17 0533 06/03/17 0546  WBC 8.8 5.8 12.1*  NEUTROABS 6.2  --   --   HGB 17.5* 13.8 12.4  HCT 48.0* 39.9 36.0  MCV 85.9 88.7 90.0  PLT 225 189 620   Basic Metabolic Panel:  Recent Labs Lab 06-05-2017 1653 2017-06-05 2230 05/30/17 1023 05/31/17 0558 06/01/17 0533 06/03/17 0546 06/03/17 1157  NA  130*  --  133* 137 135 135  --   K 2.8*  --  3.0* 4.9 4.3 4.5  --   CL 96*  --  106 113* 109 103  --   CO2 17*  --  18* 18* 22 26  --   GLUCOSE 107*  --  110* 102* 127* 117* 393*  BUN 37*  --  27* _0 --   CREATININE 1.48*  --  1.11* 0.70 0.51 0.68  --   CALCIUM 8.1*  --  8.6* 8.7* 8.8* 9.2  --   MG  --  1.8  --  1.4* 1.7  --   --    GFR: Estimated Creatinine Clearance: 62.2  mL/min (by C-G formula based on SCr of 0.68 mg/dL). Liver Function Tests:  Recent Labs Lab 05/29/17 1653  AST 23  ALT 11*  ALKPHOS 80  BILITOT 0.9  PROT 7.9  ALBUMIN 4.5   No results for input(s): LIPASE, AMYLASE in the last 168 hours. No results for input(s): AMMONIA in the last 168 hours. Coagulation Profile: No results for input(s): INR, PROTIME in the last 168 hours. Cardiac Enzymes: No results for input(s): CKTOTAL, CKMB, CKMBINDEX, TROPONINI in the last 168 hours. BNP (last 3 results) No results for input(s): PROBNP in the last 8760 hours. HbA1C: No results for input(s): HGBA1C in the last 72 hours. CBG:  Recent Labs Lab 06/02/17 2311 06/03/17 0118 06/03/17 0401 06/03/17 0725 06/03/17 1135  GLUCAP 498* 357* 160* 157* 424*   Lipid Profile: No results for input(s): CHOL, HDL, LDLCALC, TRIG, CHOLHDL, LDLDIRECT in the last 72 hours. Thyroid Function Tests: No results for input(s): TSH, T4TOTAL, FREET4, T3FREE, THYROIDAB in the last 72 hours. Anemia Panel: No results for input(s): VITAMINB12, FOLATE, FERRITIN, TIBC, IRON, RETICCTPCT in the last 72 hours. Urine analysis:    Component Value Date/Time   COLORURINE YELLOW 05/29/2017 2034   APPEARANCEUR CLEAR 05/29/2017 2034   LABSPEC 1.010 05/29/2017 2034   PHURINE 6.0 05/29/2017 2034   GLUCOSEU NEGATIVE 05/29/2017 2034   GLUCOSEU NEGATIVE 12/16/2016 1444   HGBUR NEGATIVE 05/29/2017 2034   BILIRUBINUR NEGATIVE 05/29/2017 2034   KETONESUR NEGATIVE 05/29/2017 2034   PROTEINUR NEGATIVE 05/29/2017 2034   UROBILINOGEN 0.2 12/16/2016  1444   NITRITE NEGATIVE 05/29/2017 2034   LEUKOCYTESUR TRACE (A) 05/29/2017 2034   Sepsis Labs: Invalid input(s): PROCALCITONIN, LACTICIDVEN  No results found for this or any previous visit (from the past 240 hour(s)).    Radiology Studies: Mr Thoracic Spine W Wo Contrast  Result Date: 06/01/2017 CLINICAL DATA:  61 y/o F; metastatic lung cancer and worsening lower back pain. Acute urinary retention. EXAM: MRI THORACIC AND LUMBAR SPINE WITHOUT AND WITH CONTRAST TECHNIQUE: Multiplanar and multiecho pulse sequences of the thoracic and lumbar spine were obtained without and with intravenous contrast. CONTRAST:  11m MULTIHANCE GADOBENATE DIMEGLUMINE 529 MG/ML IV SOLN COMPARISON:  03/29/2017 CT of the abdomen and pelvis. 05/02/2013 lumbar spine MRI. 04/24/2017 and 03/10/2017 CT of the chest. FINDINGS: MRI THORACIC SPINE FINDINGS Moderate motion artifact. Alignment:  Physiologic. Vertebrae: Moderate 50% T8 vertebral body compression deformity with edema and enhancement. T2, T3, T4, and T11 mild superior endplate depressions with edema and enhancement. Chronic mild superior endplate deformities of the T5 through T7 vertebral bodies. No expansile mass lesion is identified. Cord: Normal signal and morphology. No abnormal enhancement identified. No epidural disease identified. Paraspinal and other soft tissues: Right azygoesophageal recess soft tissue lesion measuring 20 x 28 mm, probably lymphadenopathy, mildly increased in size from prior CT of chest. Disc levels: No significant disc displacement, foraminal stenosis, or canal stenosis. MRI LUMBAR SPINE FINDINGS Moderate motion artifact. Segmentation:  Standard. Alignment:  Physiologic. Vertebrae: The L5-S1 interbody fusion prosthesis produces susceptibility artifact partially obscuring the adjacent vertebral bodies and locally disturbance fat suppression. No bone marrow edema or abnormal enhancement. The Conus medullaris: Extends to the L1 level and appears  normal. No abnormal enhancement identified. No epidural disease identified. Paraspinal and other soft tissues: Postsurgical changes within paraspinal muscles and subcutaneous fat related to left-sided L5-S1 laminectomy and disc replacement. A discrete fluid collection. Disc levels: L1-2: No significant disc displacement, foraminal narrowing, or canal stenosis. L2-3: Minimal disc bulge. No significant foraminal or  canal stenosis. L3-4: Minimal disc bulge. No significant foraminal or canal stenosis. L4-5: Small disc bulge with mild facet hypertrophy. Mild bilateral foraminal and lateral recess stenosis. No significant canal stenosis. L5-S1: Discectomy. Mild facet hypertrophy. No significant foraminal or canal stenosis. IMPRESSION: 1. Moderate motion artifact of thoracic and lumbar spine MRI. 2. Moderate T8 vertebral body compression deformity with edema and enhancement indicating recent injury. 3. T2, T3, T4, and T11 mild superior endplate depressions with edema and enhancement indicating recent injury. 4. No expansile mass lesion to suggest bony metastatic disease, although pathologic fracture deformity is possible in the T8 vertebral body which is diffusely enhancing. Follow-up is recommended. 5. No bony retropulsion or significant thoracic spinal canal stenosis. 6. No abnormal signal or enhancement of the thoracic and lumbar spinal cord or the cauda equina. 7. No epidural disease identified. 8. Mild lower lumbar spine degenerative changes without significant foraminal narrowing or canal stenosis. 9. Right azygoesophageal recess soft tissue lesion, probably lymphadenopathy, appears mildly increased in size from prior CT of chest given differences in technique. Electronically Signed   By: Kristine Garbe M.D.   On: 06/01/2017 19:54   Mr Lumbar Spine W Wo Contrast  Result Date: 06/01/2017 CLINICAL DATA:  61 y/o F; metastatic lung cancer and worsening lower back pain. Acute urinary retention. EXAM: MRI  THORACIC AND LUMBAR SPINE WITHOUT AND WITH CONTRAST TECHNIQUE: Multiplanar and multiecho pulse sequences of the thoracic and lumbar spine were obtained without and with intravenous contrast. CONTRAST:  77m MULTIHANCE GADOBENATE DIMEGLUMINE 529 MG/ML IV SOLN COMPARISON:  03/29/2017 CT of the abdomen and pelvis. 05/02/2013 lumbar spine MRI. 04/24/2017 and 03/10/2017 CT of the chest. FINDINGS: MRI THORACIC SPINE FINDINGS Moderate motion artifact. Alignment:  Physiologic. Vertebrae: Moderate 50% T8 vertebral body compression deformity with edema and enhancement. T2, T3, T4, and T11 mild superior endplate depressions with edema and enhancement. Chronic mild superior endplate deformities of the T5 through T7 vertebral bodies. No expansile mass lesion is identified. Cord: Normal signal and morphology. No abnormal enhancement identified. No epidural disease identified. Paraspinal and other soft tissues: Right azygoesophageal recess soft tissue lesion measuring 20 x 28 mm, probably lymphadenopathy, mildly increased in size from prior CT of chest. Disc levels: No significant disc displacement, foraminal stenosis, or canal stenosis. MRI LUMBAR SPINE FINDINGS Moderate motion artifact. Segmentation:  Standard. Alignment:  Physiologic. Vertebrae: The L5-S1 interbody fusion prosthesis produces susceptibility artifact partially obscuring the adjacent vertebral bodies and locally disturbance fat suppression. No bone marrow edema or abnormal enhancement. The Conus medullaris: Extends to the L1 level and appears normal. No abnormal enhancement identified. No epidural disease identified. Paraspinal and other soft tissues: Postsurgical changes within paraspinal muscles and subcutaneous fat related to left-sided L5-S1 laminectomy and disc replacement. A discrete fluid collection. Disc levels: L1-2: No significant disc displacement, foraminal narrowing, or canal stenosis. L2-3: Minimal disc bulge. No significant foraminal or canal  stenosis. L3-4: Minimal disc bulge. No significant foraminal or canal stenosis. L4-5: Small disc bulge with mild facet hypertrophy. Mild bilateral foraminal and lateral recess stenosis. No significant canal stenosis. L5-S1: Discectomy. Mild facet hypertrophy. No significant foraminal or canal stenosis. IMPRESSION: 1. Moderate motion artifact of thoracic and lumbar spine MRI. 2. Moderate T8 vertebral body compression deformity with edema and enhancement indicating recent injury. 3. T2, T3, T4, and T11 mild superior endplate depressions with edema and enhancement indicating recent injury. 4. No expansile mass lesion to suggest bony metastatic disease, although pathologic fracture deformity is possible in the T8 vertebral body which  is diffusely enhancing. Follow-up is recommended. 5. No bony retropulsion or significant thoracic spinal canal stenosis. 6. No abnormal signal or enhancement of the thoracic and lumbar spinal cord or the cauda equina. 7. No epidural disease identified. 8. Mild lower lumbar spine degenerative changes without significant foraminal narrowing or canal stenosis. 9. Right azygoesophageal recess soft tissue lesion, probably lymphadenopathy, appears mildly increased in size from prior CT of chest given differences in technique. Electronically Signed   By: Kristine Garbe M.D.   On: 06/01/2017 19:54   Dg Chest Port 1 View  Result Date: 06/02/2017 CLINICAL DATA:  Increasing shortness of breath, chest congestion. EXAM: PORTABLE CHEST 1 VIEW COMPARISON:  Chest radiograph May 29, 2017 FINDINGS: Cardiac silhouette is mildly enlarged. Calcified aortic knob. Diffuse interstitial prominence, confluent in the lung bases without pleural effusion or focal consolidation. No pneumothorax. Surgical clips in the included right abdomen compatible with cholecystectomy. Coarse calcifications in the upper abdomen are likely pancreatic. IMPRESSION: COPD with increasing bibasilar interstitial prominence,  possible atypical infection. LEFT lung base atelectasis/scarring. Electronically Signed   By: Elon Alas M.D.   On: 06/02/2017 05:19     Scheduled Meds: . aspirin EC  81 mg Oral Daily  . carvedilol  6.25 mg Oral BID WC  . enoxaparin (LOVENOX) injection  40 mg Subcutaneous Q24H  . fentaNYL  50 mcg Transdermal Q72H  . gabapentin  300 mg Oral BID  . insulin aspart  0-9 Units Subcutaneous TID WC  . ipratropium-albuterol  3 mL Nebulization Q6H  . magnesium oxide  400 mg Oral TID PC  . methylPREDNISolone (SOLU-MEDROL) injection  60 mg Intravenous Q12H  . mirtazapine  7.5 mg Oral QHS  . multivitamin with minerals  1 tablet Oral Daily  . pantoprazole  40 mg Oral BID  . polyethylene glycol  17 g Oral Daily  . senna-docusate  1 tablet Oral BID  . tamsulosin  0.4 mg Oral Daily   Continuous Infusions: . azithromycin Stopped (06/03/17 1038)     Junetta Hearn.  [facility] Pager [phone]  If 7PM-7AM, please contact night-coverage [email or web address] Password [facility] 06/03/2017, 4:47 PM

## 2017-06-03 NOTE — Progress Notes (Signed)
CSW met with patient at bedside to discuss discharge plans to SNF, the patient declines SNF placement.   Nicole Sinclair, LCSWA, MSW Clinical Social Worker 5E and Psychiatric Service Line [phone] 06/03/2017  12:12 PM  

## 2017-06-03 NOTE — Progress Notes (Signed)
The patient is receiving Protonix by the intravenous route.  Based on criteria approved by the Pharmacy and [place], the medication is being converted to the equivalent oral dose form.  These criteria include: -No active GI bleeding -Able to tolerate diet of full liquids (or better) or tube feeding -Able to tolerate other medications by the oral or enteral route  If you have any questions about this conversion, please contact the Pharmacy Department (phone [phone]).  Thank you.   Koleen Nimrod PharmD Candidate

## 2017-06-03 NOTE — Progress Notes (Signed)
Physical Therapy Treatment Patient Details Name: Phyllis [name] MRN: [id number] DOB: [birth year] Today's Date: 06/03/2017    History of Present Illness 61 year old female with hep C, hypothyroidism, metastatic lung cancer currently getting radiation therapy, COPD, pulmonary hypertension on chronic home oxygen, diastolic CHF, who was admitted on 8/26 with poor p.o. intake, acute renal failure and hypokalemia.[name]   MRI  demonstrated diffuse endplate fractures of the thoracic spine, and T8 compression deformity.    PT Comments    The patient reports that she does not feel well. Back pain 7/10. Patient did not ambulate today, therefore unable to assess if TLSO could be beneficial. Continue PT.   Follow Up Recommendations  SNF;Supervision/Assistance - 24 hour     Equipment Recommendations  Rolling walker with 5" wheels    Recommendations for Other Services       Precautions / Restrictions Precautions Precautions: Fall    Mobility  Bed Mobility   Bed Mobility: Rolling;Sidelying to Sit;Sit to Sidelying Rolling: Supervision Sidelying to sit: Supervision     Sit to sidelying: Supervision General bed mobility comments: encouraged patient to log roll to side for back protection  Transfers Overall transfer level: Needs assistance Equipment used: 1 person hand held assist Transfers: Sit to/from Stand Sit to Stand: Min assist         General transfer comment: cues and assist for safety and balance on standing  Ambulation/Gait             General Gait Details: patient declined to ambulate. took side steps.   Stairs            Wheelchair Mobility    Modified Rankin (Stroke Patients Only)       Balance                                            Cognition Arousal/Alertness: Awake/alert Behavior During Therapy: WFL for tasks assessed/performed Overall Cognitive Status: Impaired/Different from baseline                            Safety/Judgement: Decreased awareness of deficits     General Comments: patient states that she does not know why her back hurts, does not  appear to have knowledge of compression fractures.      Exercises      General Comments        Pertinent Vitals/Pain Pain Score: 7  Pain Location: back Pain Descriptors / Indicators: Grimacing;Moaning;Guarding Pain Intervention(s): Premedicated before session;Repositioned;Monitored during session    Home Living                      Prior Function            PT Goals (current goals can now be found in the care plan section) Progress towards PT goals: Not progressing toward goals - comment (pain limiting)    Frequency    Min 2X/week      PT Plan Current plan remains appropriate    Co-evaluation              AM-PAC PT "6 Clicks" Daily Activity  Outcome Measure  Difficulty turning over in bed (including adjusting bedclothes, sheets and blankets)?: Unable Difficulty moving from lying on back to sitting on the side of the bed? : Unable Difficulty sitting down on and standing up from  a chair with arms (e.g., wheelchair, bedside commode, etc,.)?: A Little Help needed moving to and from a bed to chair (including a wheelchair)?: A Little Help needed walking in hospital room?: A Little Help needed climbing 3-5 steps with a railing? : A Lot 6 Click Score: 13    End of Session   Activity Tolerance: Patient limited by fatigue;Patient limited by lethargy;Patient limited by pain Patient left: in bed;with call bell/phone within reach;with bed alarm set Nurse Communication: Mobility status PT Visit Diagnosis: Unsteadiness on feet (R26.81);Difficulty in walking, not elsewhere classified (R26.2);Muscle weakness (generalized) (M62.81);Pain Pain - part of body:  (back)     Time: 9672-2773 PT Time Calculation (min) (ACUTE ONLY): 17 min  Charges:  $Therapeutic Activity: 8-22 mins                    G CodesTresa Endo PT [phone]    Claretha Cooper 06/03/2017, 11:50 AM

## 2017-06-04 LAB — BASIC METABOLIC PANEL
Anion gap: 6 (ref 5–15)
BUN: 26 mg/dL — AB (ref 6–20)
CALCIUM: 9.1 mg/dL (ref 8.9–10.3)
CHLORIDE: 98 mmol/L — AB (ref 101–111)
CO2: 30 mmol/L (ref 22–32)
CREATININE: 1.04 mg/dL — AB (ref 0.44–1.00)
GFR, EST NON AFRICAN AMERICAN: 56 mL/min — AB (ref >60)
Glucose, Bld: 161 mg/dL — ABNORMAL HIGH (ref 65–99)
Potassium: 5 mmol/L (ref 3.5–5.1)
SODIUM: 134 mmol/L — AB (ref 135–145)

## 2017-06-04 LAB — CBC
HCT: 35.4 % — ABNORMAL LOW (ref 36.0–46.0)
HEMOGLOBIN: 11.7 g/dL — AB (ref 12.0–15.0)
MCH: 30.5 pg (ref 26.0–34.0)
MCHC: 33.1 g/dL (ref 30.0–36.0)
MCV: 92.4 fL (ref 78.0–100.0)
Platelets: 213 10*3/uL (ref 150–400)
RBC: 3.83 MIL/uL — ABNORMAL LOW (ref 3.87–5.11)
RDW: 14.3 % (ref 11.5–15.5)
WBC: 14.8 10*3/uL — ABNORMAL HIGH (ref 4.0–10.5)

## 2017-06-04 LAB — GLUCOSE, CAPILLARY
GLUCOSE-CAPILLARY: 245 mg/dL — AB (ref 65–99)
Glucose-Capillary: 365 mg/dL — ABNORMAL HIGH (ref 65–99)
Glucose-Capillary: 174 mg/dL — ABNORMAL HIGH (ref 65–99)
Glucose-Capillary: 274 mg/dL — ABNORMAL HIGH (ref 65–99)

## 2017-06-04 MED ORDER — METHYLPREDNISOLONE SODIUM SUCC 40 MG IJ SOLR
40.0000 mg | Freq: Two times a day (BID) | INTRAMUSCULAR | Status: DC
  Administered 2017-06-04 – 2017-06-05 (×2): 40 mg via INTRAVENOUS
  Filled 2017-06-04 (×2): qty 1

## 2017-06-04 MED ORDER — IPRATROPIUM-ALBUTEROL 0.5-2.5 (3) MG/3ML IN SOLN
3.0000 mL | Freq: Two times a day (BID) | RESPIRATORY_TRACT | Status: DC
  Administered 2017-06-05 – 2017-06-06 (×3): 3 mL via RESPIRATORY_TRACT
  Filled 2017-06-04 (×3): qty 3

## 2017-06-04 MED ORDER — ACETAMINOPHEN 325 MG PO TABS
650.0000 mg | ORAL_TABLET | Freq: Four times a day (QID) | ORAL | Status: DC | PRN
  Administered 2017-06-04 – 2017-06-06 (×6): 650 mg via ORAL
  Filled 2017-06-04 (×6): qty 2

## 2017-06-04 MED ORDER — OXYCODONE HCL 5 MG PO TABS
5.0000 mg | ORAL_TABLET | Freq: Four times a day (QID) | ORAL | Status: DC | PRN
  Administered 2017-06-04 – 2017-06-06 (×8): 5 mg via ORAL
  Filled 2017-06-04 (×8): qty 1

## 2017-06-04 NOTE — Progress Notes (Signed)
PROGRESS NOTE  Phyllis [name] [id number]:[id number] DOB: November 30, 1955 DOA: 05/29/2017 PCP: [name], MD   LOS: 4 days   Brief Narrative / Interim history: 61 year old female with hep C, hypothyroidism, metastatic lung cancer currently getting radiation therapy, COPD, pulmonary hypertension on chronic home oxygen, diastolic CHF, who was admitted on 8/26 with poor p.o. intake, acute renal failure and hypokalemia. 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  Bladder scan last night showed greater than 500 cc in the bladder after voiding.  She got an I&O cath, failed to be able to void again overnight, and had a Foley catheter placed this morning 8/28.  She is also been reporting progressive worsening low back pain.  She denies any weakness or numbness.   Started  Flomax.  She will need voiding trial.   Acute kidney injury -Likely in the setting of poor p.o. intake as well as her home diuretics, also contributing is her urinary retention at home -Creatinine has now normalized with decompression and IV fluids.  -NSL.  Acidosis resolved.  Mildly increase cr, hold lasix today    Hyponatremia/hypokalemia -mg normalized.    Flank pain, Back pain  -Patient with long-standing history of flank pain and back pain on multiple pain medications at home including fentanyl patch as well as oxycodone. -The husband who is very concerned and tells me that patient is taking more than prescribed in terms of oxycodone as well as her patches.  He is also mentioning that she is consuming large amounts of ibuprofen as well, and he fears that she has become dependent on narcotics.  -CT scan on admission without acute findings, MRI as above pending MRI thoracic and lumbar spine showed T 8 compression deformity, edema, also compression T T2, T3, T4, and T11 mild superior endplate depressions with edema and enhancement indicating recent injury. -Neurosurgery Consulted. Plan for brace and follow up out patient in 2 weeks for evaluation for biopsy and vertebral Augmentation.  -oxycodone PRN.   Encephalopathy;  Patient was more sleepy today. ABG not significant elevated PCO2.  IV morphine discontinue,  Monitor low dose oxycodone.  Improved.   Acute hypoxic respiratory Failure;  She has Bilateral wheezing. Taper steroids.  Continue with schedule nebulizer.  Reflux medication.  Chest x ray with atypical infection. Continue with  azithromycin  Hold lasix.   Hypothyroidism -TSH 3.0, continue home Synthroid  Type 2 diabetes mellitus -Hold oral hypoglycemics, place on sliding scale.  Most recent A1c last month shows good control at 6.3 Hyperglycemia in setting of steroid. SSI.   Anxiety -Xanax as needed  Severe protein calorie malnutrition -In the setting of metastatic lung disease  Chronic diastolic CHF -She appears hypovolemic, continue fluids.  Most recent echo in April 2018 with 60-65% EF, grade 1 diastolic dysfunction.  Continue Coreg.  Thrombocytopenia -Chronic and stable.  No bleeding  Lung cancer with brain metastases -She was  getting outpatient radiation therapy.  Now on surveillance for brain mets. Discussed with radiation oncologist nurse.  -Dr Julien Nordmann added to treatment team.   COPD -Stable, no wheezing, continue home oxygen   DVT prophylaxis: Lovenox Code Status: Full code Family Communication: no family at bedside.  Disposition Plan: home 1-2 dyas  Consultants:   None   Procedures:   None   Antimicrobials:  None    Subjective: She is complaining of back pain , pain is not controlled.  Breathing is better   Objective: Vitals:   06/03/17 2110 06/03/17 2143 06/04/17 0532 06/04/17 0910  BP:  120/63 120/64   Pulse:  73 64 64  Resp:  _0 Temp:  98.4 F (36.9 C) 98.3 F (36.8 C)   TempSrc:  Oral Oral   SpO2: 99% 98% 100% 98%  Weight:      Height:        Intake/Output Summary (Last 24 hours) at 06/04/17 1355 Last data filed at 06/04/17 0900  Gross per 24 hour  Intake              610 ml  Output             2125 ml  Net            -1515 ml   Filed Weights   06/01/17 0817 06/03/17 1150  Weight: 52.9 kg (116 lb 11.2 oz) 54 kg (119 lb)    Examination:  Vitals:   06/03/17 2110 06/03/17 2143 06/04/17 0532 06/04/17 0910  BP:  120/63 120/64   Pulse:  73 64 64  Resp:  _1 Temp:  98.4 F (36.9 C) 98.3 F (36.8 C)   TempSrc:  Oral Oral   SpO2: 99% 98% 100% 98%  Weight:      Height:        Constitutional: NAD ENMT: Poor dentition,  Neck: Supple.  Respiratory: Normal Respiratory effort, less bilateral wheezing.  Cardiovascular: S 1, S 2 RRR Abdomen: BS present, soft, nt Skin: no rash.  Extremities. No edema.     Data Reviewed: I have independently reviewed following labs and imaging studies    CBC:  Recent Labs Lab 06/04/2017 1653 06/01/17 0533 06/03/17 0546 06/04/17 0639  WBC 8.8 5.8 12.1* 14.8*  NEUTROABS 6.2  --   --   --   HGB 17.5* 13.8 12.4 11.7*  HCT 48.0* 39.9 36.0 35.4*  MCV 85.9 88.7 90.0 92.4  PLT 225 189 192 470   Basic Metabolic Panel:  Recent Labs Lab Jun 04, 2017 2230  05/30/17 1023 05/31/17 0558 06/01/17 0533 06/03/17 0546 06/03/17 1157 06/04/17 0639  NA  --  133* 137 135 135  --  134*  K  --  3.0* 4.9 4.3 4.5  --  5.0  CL  --  106 113* 109 103  --  98*  CO2  --  18* 18* 22 26  --  30  GLUCOSE  --  110* 102* 127* 117* 393* 161*  BUN  --  27* _2 --  26*  CREATININE  --  1.11* 0.70 0.51 0.68  --  1.04*  CALCIUM  --  8.6* 8.7* 8.8* 9.2  --  9.1  MG 1.8  --  1.4* 1.7  --   --   --  GFR: Estimated Creatinine Clearance: 47.8 mL/min (A) (by C-G formula based on SCr of 1.04 mg/dL (H)). Liver Function Tests:  Recent Labs Lab 05/29/17 1653  AST 23  ALT 11*  ALKPHOS 80  BILITOT 0.9  PROT 7.9  ALBUMIN 4.5   No results for input(s): LIPASE, AMYLASE in the last 168 hours. No results for input(s): AMMONIA in the last 168 hours. Coagulation Profile: No results for input(s): INR, PROTIME in the last 168 hours. Cardiac Enzymes: No results for input(s): CKTOTAL, CKMB, CKMBINDEX, TROPONINI in the last 168 hours. BNP (last 3 results) No results for input(s): PROBNP in the last 8760 hours. HbA1C: No results for input(s): HGBA1C in the last 72 hours. CBG:  Recent Labs Lab 06/03/17 1135 06/03/17 1700 06/03/17 2144 06/04/17 0738 06/04/17 1118  GLUCAP 424* 137* 238* 245* 365*   Lipid Profile: No results for input(s): CHOL, HDL, LDLCALC, TRIG, CHOLHDL, LDLDIRECT in the last 72 hours. Thyroid Function Tests: No results for input(s): TSH, T4TOTAL, FREET4, T3FREE, THYROIDAB in the last 72 hours. Anemia Panel: No results for input(s): VITAMINB12, FOLATE, FERRITIN, TIBC, IRON, RETICCTPCT in the last 72 hours. Urine analysis:    Component Value Date/Time   COLORURINE YELLOW 05/29/2017 2034   APPEARANCEUR CLEAR 05/29/2017 2034   LABSPEC 1.010 05/29/2017 2034   PHURINE 6.0 05/29/2017 2034   GLUCOSEU NEGATIVE 05/29/2017 2034   GLUCOSEU NEGATIVE 12/16/2016 1444   HGBUR NEGATIVE 05/29/2017 2034   BILIRUBINUR NEGATIVE 05/29/2017 2034    KETONESUR NEGATIVE 05/29/2017 2034   PROTEINUR NEGATIVE 05/29/2017 2034   UROBILINOGEN 0.2 12/16/2016 1444   NITRITE NEGATIVE 05/29/2017 2034   LEUKOCYTESUR TRACE (A) 05/29/2017 2034   Sepsis Labs: Invalid input(s): PROCALCITONIN, LACTICIDVEN  No results found for this or any previous visit (from the past 240 hour(s)).    Radiology Studies: No results found.   Scheduled Meds: . aspirin EC  81 mg Oral Daily  . carvedilol  3.125 mg Oral BID WC  . enoxaparin (LOVENOX) injection  40 mg Subcutaneous Q24H  . fentaNYL  50 mcg Transdermal Q72H  . gabapentin  300 mg Oral BID  . insulin aspart  0-9 Units Subcutaneous TID WC  . insulin glargine  12 Units Subcutaneous QHS  . ipratropium-albuterol  3 mL Nebulization Q6H  . magnesium oxide  400 mg Oral TID PC  . methylPREDNISolone (SOLU-MEDROL) injection  40 mg Intravenous Q12H  . mirtazapine  7.5 mg Oral QHS  . multivitamin with minerals  1 tablet Oral Daily  . pantoprazole  40 mg Oral BID  . polyethylene glycol  17 g Oral Daily  . senna-docusate  1 tablet Oral BID  . tamsulosin  0.4 mg Oral Daily   Continuous Infusions: . azithromycin 500 mg (06/04/17 1021)     Johnta Couts.  [facility] Pager [phone]  If 7PM-7AM, please contact night-coverage [email or web address] Password [email or web address] 06/04/2017, 1:55 PM

## 2017-06-05 LAB — BASIC METABOLIC PANEL
ANION GAP: 7 (ref 5–15)
BUN: 30 mg/dL — AB (ref 6–20)
CHLORIDE: 98 mmol/L — AB (ref 101–111)
CO2: 27 mmol/L (ref 22–32)
Calcium: 9 mg/dL (ref 8.9–10.3)
Creatinine, Ser: 1 mg/dL (ref 0.44–1.00)
GFR calc Af Amer: >60 mL/min (ref >60)
GFR, EST NON AFRICAN AMERICAN: 59 mL/min — AB (ref >60)
GLUCOSE: 264 mg/dL — AB (ref 65–99)
POTASSIUM: 4.5 mmol/L (ref 3.5–5.1)
Sodium: 132 mmol/L — ABNORMAL LOW (ref 135–145)

## 2017-06-05 LAB — GLUCOSE, CAPILLARY
GLUCOSE-CAPILLARY: 114 mg/dL — AB (ref 65–99)
GLUCOSE-CAPILLARY: 210 mg/dL — AB (ref 65–99)
GLUCOSE-CAPILLARY: 158 mg/dL — AB (ref 65–99)
Glucose-Capillary: 194 mg/dL — ABNORMAL HIGH (ref 65–99)

## 2017-06-05 LAB — CBC
HCT: 34.4 % — ABNORMAL LOW (ref 36.0–46.0)
HEMOGLOBIN: 11.1 g/dL — AB (ref 12.0–15.0)
MCH: 29.8 pg (ref 26.0–34.0)
MCHC: 32.3 g/dL (ref 30.0–36.0)
MCV: 92.5 fL (ref 78.0–100.0)
PLATELETS: 210 10*3/uL (ref 150–400)
RBC: 3.72 MIL/uL — AB (ref 3.87–5.11)
RDW: 14.1 % (ref 11.5–15.5)
WBC: 9.3 10*3/uL (ref 4.0–10.5)

## 2017-06-05 MED ORDER — PREDNISONE 50 MG PO TABS
60.0000 mg | ORAL_TABLET | Freq: Every day | ORAL | Status: DC
  Administered 2017-06-06: 08:00:00 60 mg via ORAL
  Filled 2017-06-05: qty 1

## 2017-06-05 MED ORDER — PREDNISONE 50 MG PO TABS: 60.0000 mg | ORAL_TABLET | Freq: Every day | ORAL | Status: DC

## 2017-06-05 NOTE — Progress Notes (Signed)
PROGRESS NOTE  Phyllis [name] [id number] DOB: 04-29-1956 DOA: 05/29/2017 PCP: [name], MD   LOS: 5 days   Brief Narrative / Interim history: 61 year old female with hep C, hypothyroidism, metastatic lung cancer currently getting radiation therapy, COPD, pulmonary hypertension on chronic home oxygen, diastolic CHF, who was admitted on 8/26 with poor p.o. intake, acute renal failure and hypokalemia. 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  Bladder scan last night showed greater than 500 cc in the bladder after voiding.  She got an I&O cath, failed to be able to void again overnight, and had a Foley catheter placed this morning 8/28.  She is also been reporting progressive worsening low back pain.  She denies any weakness or numbness.   Started  Flomax.  She will need voiding trial.   Acute kidney injury -Likely in the setting of poor p.o. intake as well as her home diuretics, also contributing is her urinary retention at home -Creatinine has now normalized with decompression and IV fluids.  -NSL.  Acidosis resolved.  Stable, hold lasix    Hyponatremia/hypokalemia -mg normalized.    Flank pain, Back pain  -Patient with long-standing history of flank pain and back pain on multiple pain medications at home including fentanyl patch as well as oxycodone. -The husband who is very concerned and tells me that patient is taking more than prescribed in terms of oxycodone as well as her patches.  He is also mentioning that she is consuming large amounts of ibuprofen as well, and he fears that she has become dependent on narcotics.  -CT scan on admission without acute findings, MRI as above pending MRI thoracic and lumbar spine showed T 8 compression deformity, edema, also compression T T2, T3, T4, and T11 mild superior endplate depressions with edema and enhancement indicating recent injury. -Neurosurgery Consulted. Plan for brace and follow up out patient in 2 weeks for evaluation for biopsy and vertebral Augmentation.  -oxycodone PRN.   Encephalopathy;  Patient was more sleepy today. ABG not significant elevated PCO2.  IV morphine discontinue,  Monitor low dose oxycodone.  Improved.   Acute hypoxic respiratory Failure;  She has Bilateral wheezing. Taper steroids.  Continue with schedule nebulizer.  Reflux medication.  Chest x ray with atypical infection. Continue with  azithromycin  Hold lasix.  Improved, taper prednisone.   Hypothyroidism -TSH 3.0, continue home Synthroid  Type 2 diabetes mellitus -Hold oral hypoglycemics, place on sliding scale.  Most recent A1c last month shows good control at 6.3 Hyperglycemia in setting of steroid. SSI.   Anxiety -Xanax as needed  Severe protein calorie malnutrition -In the setting of metastatic lung disease  Chronic diastolic CHF -She appears hypovolemic, continue  fluids.  Most recent echo in April 2018 with 60-65% EF, grade 1 diastolic dysfunction.  Continue Coreg.  Thrombocytopenia -Chronic and stable.  No bleeding  Lung cancer with brain metastases -She was  getting  outpatient radiation therapy. Now on surveillance for brain mets. Discussed with radiation oncologist nurse.  -Dr Julien Nordmann added to treatment team.   COPD -Stable, no wheezing, continue home oxygen   DVT prophylaxis: Lovenox Code Status: Full code Family Communication: no family at bedside.  Disposition Plan: home 1  Consultants:   None   Procedures:   None   Antimicrobials:  None    Subjective: She doesn't feel ready today to go home. Complaints of back pain.  Breathing fair.   Objective: Vitals:   06/04/17 2003 06/04/17 2100 06/05/17 0512 06/05/17 0915  BP: 136/64  125/60   Pulse: 83  70 85  Resp: _0 Temp: 98.2 F (36.8 C)  98.5 F (36.9 C)   TempSrc: Oral  Oral   SpO2: 98% 95% 97% 95%  Weight:   56.8 kg (125 lb 3.2 oz)   Height:        Intake/Output Summary (Last 24 hours) at 06/05/17 1338 Last data filed at 06/05/17 1300  Gross per 24 hour  Intake             1080 ml  Output             2001 ml  Net             -921 ml   Filed Weights   06/01/17 0817 06/03/17 1150 06/05/17 0512  Weight: 52.9 kg (116 lb 11.2 oz) 54 kg (119 lb) 56.8 kg (125 lb 3.2 oz)    Examination:  Vitals:   06/04/17 2003 06/04/17 2100 06/05/17 0512 06/05/17 0915  BP: 136/64  125/60   Pulse: 83  70 85  Resp: _1 Temp: 98.2 F (36.8 C)  98.5 F (36.9 C)   TempSrc: Oral  Oral   SpO2: 98% 95% 97% 95%  Weight:   56.8 kg (125 lb 3.2 oz)   Height:        Constitutional: NAD ENMT: Poor dentition.  Neck: Supple Respiratory: Normal respiratory effort.  Cardiovascular: S 1, S 2 RRR Abdomen: BS present, soft, nt Skin: No rash  Extremities. No edema.     Data Reviewed: I have independently reviewed following labs and imaging studies    CBC:  Recent Labs Lab June 04, 2017 1653 06/01/17 0533 06/03/17 0546 06/04/17 0639 06/05/17 0523  WBC 8.8 5.8 12.1* 14.8* 9.3  NEUTROABS 6.2  --   --   --   --   HGB 17.5* 13.8 12.4 11.7* 11.1*  HCT 48.0* 39.9 36.0 35.4*  34.4*  MCV 85.9 88.7 90.0 92.4 92.5  PLT 225 189 192 213 595   Basic Metabolic Panel:  Recent Labs Lab Jun 04, 2017 2230  05/31/17 0558 06/01/17 0533 06/03/17 0546 06/03/17 1157 06/04/17 0639 06/05/17 0523  NA  --   < > 137 135 135  --  134* 132*  K  --   < > 4.9 4.3 4.5  --  5.0 4.5  CL  --   < > 113* 109 103  --  98* 98*  CO2  --   < > 18* 22 26  --  30 27  GLUCOSE  --   < > 102* 127* 117* 393* 161* 264*  BUN  --   < > 20 12  17  --  26* 30*  CREATININE  --   < > 0.70 0.51 0.68  --  1.04* 1.00  CALCIUM  --   < > 8.7* 8.8* 9.2  --  9.1 9.0  MG 1.8  --  1.4* 1.7  --   --   --   --   < > = values in this interval not displayed. GFR: Estimated Creatinine Clearance: 52.3 mL/min (by C-G formula based on SCr of 1 mg/dL). Liver Function Tests:  Recent Labs Lab 05/29/17 1653  AST 23  ALT 11*  ALKPHOS 80  BILITOT 0.9  PROT 7.9  ALBUMIN 4.5   No results for input(s): LIPASE, AMYLASE in the last 168 hours. No results for input(s): AMMONIA in the last 168 hours. Coagulation Profile: No results for input(s): INR, PROTIME in the last 168 hours. Cardiac Enzymes: No results for input(s): CKTOTAL, CKMB, CKMBINDEX, TROPONINI in the last 168 hours. BNP (last 3 results) No results for input(s): PROBNP in the last 8760 hours. HbA1C: No results for input(s): HGBA1C in the last 72 hours. CBG:  Recent Labs Lab 06/04/17 1118 06/04/17 1614 06/04/17 2047 06/05/17 0725 06/05/17 1144  GLUCAP 365* 174* 274* 114* 194*   Lipid Profile: No results for input(s): CHOL, HDL, LDLCALC, TRIG, CHOLHDL, LDLDIRECT in the last 72 hours. Thyroid Function Tests: No results for input(s): TSH, T4TOTAL, FREET4, T3FREE, THYROIDAB in the last 72 hours. Anemia Panel: No results for input(s): VITAMINB12, FOLATE, FERRITIN, TIBC, IRON, RETICCTPCT in the last 72 hours. Urine analysis:    Component Value Date/Time   COLORURINE YELLOW 05/29/2017 2034   APPEARANCEUR CLEAR 05/29/2017 2034   LABSPEC 1.010  05/29/2017 2034   PHURINE 6.0 05/29/2017 2034   GLUCOSEU NEGATIVE 05/29/2017 2034   GLUCOSEU NEGATIVE 12/16/2016 1444   HGBUR NEGATIVE 05/29/2017 2034   BILIRUBINUR NEGATIVE 05/29/2017 2034   KETONESUR NEGATIVE 05/29/2017 2034   PROTEINUR NEGATIVE 05/29/2017 2034   UROBILINOGEN 0.2 12/16/2016 1444   NITRITE NEGATIVE 05/29/2017 2034   LEUKOCYTESUR TRACE (A) 05/29/2017 2034   Sepsis Labs: Invalid input(s): PROCALCITONIN, LACTICIDVEN  No results found for this or any previous visit (from the past 240 hour(s)).    Radiology Studies: No results found.   Scheduled Meds: . aspirin EC  81 mg Oral Daily  . carvedilol  3.125 mg Oral BID WC  . enoxaparin (LOVENOX) injection  40 mg Subcutaneous Q24H  . fentaNYL  50 mcg Transdermal Q72H  . gabapentin  300 mg Oral BID  . insulin aspart  0-9 Units Subcutaneous TID WC  . insulin glargine  12 Units Subcutaneous QHS  . ipratropium-albuterol  3 mL Nebulization BID  . magnesium oxide  400 mg Oral TID PC  . mirtazapine  7.5 mg Oral QHS  . multivitamin with minerals  1 tablet Oral Daily  . pantoprazole  40 mg Oral BID  . polyethylene glycol  17 g Oral Daily  . predniSONE  60 mg Oral Q breakfast  . senna-docusate  1 tablet Oral BID  . tamsulosin  0.4 mg Oral Daily   Continuous Infusions: . azithromycin Stopped (06/05/17 1048)     Erez Mccallum.  [facility] Pager [phone]  If 7PM-7AM, please contact night-coverage [email or web address] Password [email or web address] 06/05/2017, 1:38 PM

## 2017-06-06 LAB — GLUCOSE, CAPILLARY
GLUCOSE-CAPILLARY: 107 mg/dL — AB (ref 65–99)
Glucose-Capillary: 55 mg/dL — ABNORMAL LOW (ref 65–99)
Glucose-Capillary: 209 mg/dL — ABNORMAL HIGH (ref 65–99)

## 2017-06-06 MED ORDER — PREDNISONE 20 MG PO TABS: ORAL_TABLET | ORAL | 0 refills | Status: AC

## 2017-06-06 MED ORDER — SENNOSIDES-DOCUSATE SODIUM 8.6-50 MG PO TABS: 1.0000 | ORAL_TABLET | Freq: Two times a day (BID) | ORAL | 0 refills | Status: DC

## 2017-06-06 MED ORDER — MIRTAZAPINE 7.5 MG PO TABS: 7.5000 mg | ORAL_TABLET | Freq: Every day | ORAL | 0 refills | Status: DC

## 2017-06-06 MED ORDER — CARVEDILOL 3.125 MG PO TABS: 3.1250 mg | ORAL_TABLET | Freq: Two times a day (BID) | ORAL | 0 refills | Status: AC

## 2017-06-06 MED ORDER — POLYETHYLENE GLYCOL 3350 17 G PO PACK: 17.0000 g | PACK | Freq: Every day | ORAL | 0 refills | Status: AC

## 2017-06-06 MED ORDER — TAMSULOSIN HCL 0.4 MG PO CAPS: 0.4000 mg | ORAL_CAPSULE | Freq: Every day | ORAL | 0 refills | Status: AC

## 2017-06-06 MED ORDER — INSULIN GLARGINE 100 UNIT/ML ~~LOC~~ SOLN: 12.0000 [IU] | Freq: Every day | SUBCUTANEOUS | 11 refills | Status: DC

## 2017-06-06 MED ORDER — ADULT MULTIVITAMIN W/MINERALS CH: 1.0000 | ORAL_TABLET | Freq: Every day | ORAL | 0 refills | Status: DC

## 2017-06-06 MED ORDER — OXYCODONE HCL 5 MG PO TABS: 5.0000 mg | ORAL_TABLET | Freq: Four times a day (QID) | ORAL | 0 refills | Status: AC | PRN

## 2017-06-06 NOTE — Progress Notes (Signed)
CBG 55 this am, gave 4 oz of juice, then breakfast came, CBG now 107.  Will continue to monitor.

## 2017-06-06 NOTE — Care Management Note (Signed)
Case Management Note  Patient Details  Name: Phyllis [name] MRN: [id number] Date of Birth: 12-Oct-1955  Subjective/Objective:                  AKI  Action/Plan: Date:  June 06, 2017 Chart reviewed for concurrent status and case management needs. Will continue to follow patient progress. Discharge Planning: following for needs Expected discharge date: [id number] Velva Harman, BSN, [place], [place]  Expected Discharge Date:                  Expected Discharge Plan:  Home/Self Care  In-House Referral:     Discharge planning Services  CM Consult  Post Acute Care Choice:  Resumption of Svcs/PTA Provider Choice offered to:     DME Arranged:    DME Agency:     HH Arranged:    [place] Agency:     Status of Service:  In process, will continue to follow  If discussed at Long Length of Stay Meetings, dates discussed:    Additional Comments:  Leeroy Cha, RN 06/06/2017, 9:29 AM

## 2017-06-06 NOTE — Progress Notes (Signed)
Physical Therapy Treatment Patient Details Name: Phyllis [name] MRN: [id number] DOB: [birth year] Today's Date: 06/06/2017    History of Present Illness 61 year old female with hep C, hypothyroidism, metastatic lung cancer currently getting radiation therapy, COPD, pulmonary hypertension on chronic home oxygen, diastolic CHF, who was admitted on 8/26 with poor p.o. intake, acute renal failure and hypokalemia.[name]   MRI  demonstrated diffuse endplate fractures of the thoracic spine, and T8 compression deformity  , also compression T T2, T3, T4, and T11 mild superior endplate depressions with edema and enhancement indicating recent injury.    PT Comments    The patient was instructed in don/doff of TLSO. Patient  Declined use of RW and  Declines HHPT. Oxygen saturation on RA during ambulation 83%. Replaced O2  With sats tending back to 90%. The patient has oxygen at home. Continue PT. The patient is asking when she will go home.   Follow Up Recommendations  No PT follow up (patient declines HHPT and snf)     Equipment Recommendations  None recommended by PT (patient declines ) "I don't want equipment until I need it. "   Recommendations for Other Services       Precautions / Restrictions Precautions Precautions: Fall Required Braces or Orthoses: Spinal Brace Spinal Brace: Thoracolumbosacral orthotic;Applied in sitting position    Mobility  Bed Mobility Overal bed mobility: Independent                Transfers Overall transfer level: Needs assistance   Transfers: Sit to/from Stand Sit to Stand: Supervision         General transfer comment: no external support  Ambulation/Gait Ambulation/Gait assistance: Min assist;Min guard Ambulation Distance (Feet): 150 Feet       Gait velocity interpretation: Below normal speed for age/gender General Gait Details: pushed IV pole. Offered RW, patient declined   Financial trader Rankin  (Stroke Patients Only)       Balance   Sitting-balance support: No upper extremity supported Sitting balance-Leahy Scale: Good     Standing balance support: No upper extremity supported Standing balance-Leahy Scale: Fair                              Cognition Arousal/Alertness: Awake/alert                                            Exercises      General Comments        Pertinent Vitals/Pain Pain Score: 8  Pain Location: back, to right Pain Descriptors / Indicators: Grimacing;Aching;Cramping Pain Intervention(s): Monitored during session;Premedicated before session    Home Living                      Prior Function            PT Goals (current goals can now be found in the care plan section) Progress towards PT goals: Progressing toward goals    Frequency    Min 2X/week      PT Plan Current plan remains appropriate;Discharge plan needs to be updated    Co-evaluation              AM-PAC PT "6 Clicks" Daily Activity  Outcome Measure  Difficulty  turning over in bed (including adjusting bedclothes, sheets and blankets)?: None Difficulty moving from lying on back to sitting on the side of the bed? : None Difficulty sitting down on and standing up from a chair with arms (e.g., wheelchair, bedside commode, etc,.)?: A Little Help needed moving to and from a bed to chair (including a wheelchair)?: A Little Help needed walking in hospital room?: A Little Help needed climbing 3-5 steps with a railing? : A Lot 6 Click Score: 19    End of Session Equipment Utilized During Treatment: Gait belt Activity Tolerance: Patient tolerated treatment well Patient left: in bed;with call bell/phone within reach Nurse Communication: Mobility status PT Visit Diagnosis: Unsteadiness on feet (R26.81);Difficulty in walking, not elsewhere classified (R26.2);Muscle weakness (generalized) (M62.81);Pain Pain - Right/Left: Right      Time: 7703-4035 PT Time Calculation (min) (ACUTE ONLY): 20 min  Charges:  $Gait Training: 8-22 mins                    G CodesTresa Endo PT [phone]  Claretha Cooper 06/06/2017, 9:28 AM

## 2017-06-06 NOTE — Discharge Summary (Signed)
Physician Discharge Summary  Phyllis [name] KDT:[id number] DOB: [birth year] DOA: 05/29/2017  PCP: [name], MD  Admit date: 05/29/2017 Discharge date: 06/06/2017  Admitted From: Home  Disposition:  Home   Recommendations for Outpatient Follow-up:  1. Follow up with PCP in 1-2 weeks 2. Please obtain BMP/CBC in one week 3. Needs follow up with neurosurgery  4. Needs to follow up with Dr Julien Nordmann.    Home Health: yes  Discharge Condition: stable.  CODE STATUS: full code.  Diet recommendation: Heart Healthy  Brief/Interim Summary: 61 year old female with hep C, hypothyroidism, metastatic lung cancer currently getting radiation therapy, COPD, pulmonary hypertension on chronic home oxygen, diastolic CHF, who was admitted on 8/26 with poor p.o. intake, acute renal failure and hypokalemia. 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)   Malnutrition of moderate degree   Acute urinary retention -Patient initially did not mention this, however later revealed that she has been having difficulties urinating at home on and off for the past month.  Bladder scan last night showed greater than 500 cc in the bladder after voiding.  She got an I&O cath, failed to be able to void again overnight, and had a Foley catheter placed this morning 8/28.  She is also been reporting progressive worsening  low back pain.  She denies any weakness or numbness.   Started  Flomax.  She has been able to urinate   Acute kidney injury -Likely in the setting of poor p.o. intake as well as her home diuretics, also contributing is her urinary retention at home -Creatinine has now normalized with decompression and IV fluids.  -NSL.  Acidosis resolved.  Stable, hold lasix   Hyponatremia/hypokalemia -mg normalized.    Flank pain, Back pain  -Patient with long-standing history of flank pain and back pain on multiple pain medications at home including fentanyl patch as well as oxycodone. -The husband who is very concerned and tells me that patient is taking more than prescribed in terms of oxycodone as well as her patches.  He is also mentioning that she is consuming large amounts of ibuprofen as well, and he fears that she has become dependent on narcotics.  -CT scan on admission without acute findings, MRI as above pending MRI thoracic and lumbar spine showed T 8 compression deformity, edema, also compression T T2, T3, T4, and T11 mild superior endplate depressions with edema and enhancement indicating recent injury. -Neurosurgery Consulted. Plan for brace and follow up out patient in 2 weeks for evaluation for biopsy and vertebral Augmentation.  -oxycodone PRN. Will provide prescription for one week.   Encephalopathy;  Patient was more sleepy today. ABG not significant elevated PCO2.  IV morphine discontinue,  Monitor low dose oxycodone.  Improved.   Acute hypoxic respiratory Failure;  She has Bilateral wheezing. Taper steroids.  Continue with schedule nebulizer.  Reflux medication.  Chest x ray with atypical infection. Continue with  azithromycin  Improved, taper prednisone.   Hypothyroidism -TSH 3.0, continue home Synthroid  Type 2 diabetes mellitus -Hold oral hypoglycemics, place on sliding scale.  Most recent A1c last month shows good control at 6.3 Hyperglycemia in setting of  steroid. SSI.   Anxiety -Xanax as needed  Severe protein calorie malnutrition -In the setting of metastatic lung disease  Chronic diastolic CHF -She appears hypovolemic, continue fluids.  Most recent echo in April 2018 with 60-65% EF, grade 1 diastolic dysfunction.  Continue Coreg.  Thrombocytopenia -Chronic and stable.  No bleeding  Lung cancer with brain metastases -She was  getting outpatient radiation therapy. Now on surveillance for brain mets. Discussed with radiation oncologist nurse.  -Dr Julien Nordmann added to treatment team.   COPD -discharge on prednisone taper.   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 (HCC)   Chronic diastolic CHF (congestive heart failure) (HCC)   Brain metastasis (HCC)   Opiate dependence, continuous (HCC)   Malnutrition of moderate degree    Discharge Instructions  Discharge Instructions    Diet - low sodium heart healthy    Complete by:  As directed    Increase activity slowly    Complete by:  As directed      Allergies as of 06/06/2017      Reactions   Amlodipine Swelling   Meperidine Hcl Other (See Comments)   hallucinations   Naproxen Other (See Comments)   Extreme bruising      Medication List    STOP taking these medications   potassium chloride SA 20 MEQ tablet Commonly known as:  K-DUR,KLOR-CON   triamterene-hydrochlorothiazide 37.5-25 MG tablet Commonly known as:  MAXZIDE-25     TAKE these medications   ALPRAZolam 0.5 MG tablet Commonly known as:  XANAX Take 1 tablet (0.5 mg total) by mouth 3 (three) times  daily as needed. for anxiety   aspirin EC 81 MG tablet Take 81 mg by mouth daily.   blood glucose meter kit and supplies Kit Dispense based on patient and insurance preference. Use up to four times daily as directed. (FOR ICD-9 250.00, 250.01).   carvedilol 3.125 MG tablet Commonly known as:  COREG Take 1 tablet (3.125 mg total) by mouth 2 (two) times daily with a meal. What changed:  medication strength  how much to take  how to take this  when to take this  additional instructions   fentaNYL 50 MCG/HR Commonly known as:  DURAGESIC - dosed mcg/hr Place 1 patch (50 mcg total) onto the skin every 3 (three) days.   gabapentin 300 MG capsule Commonly known as:  NEURONTIN Take 1 capsule (300 mg total) by mouth 2 (two) times daily.   Glycopyrrolate 25 MCG/ML Soln Commonly known as:  LONHALA MAGNAIR REFILL KIT Inhale 1 Act into the lungs 2 (two) times daily.   magnesium oxide 400 MG tablet Commonly known as:  MAG-OX Take 1 tablet (400 mg total) by mouth 3 (three) times daily after meals.   mirtazapine 7.5 MG tablet Commonly known as:  REMERON Take 1 tablet (7.5 mg total) by mouth at bedtime.  multivitamin with minerals Tabs tablet Take 1 tablet by mouth daily.   oxyCODONE 5 MG immediate release tablet Commonly known as:  Oxy IR/ROXICODONE Take 1 tablet (5 mg total) by mouth every 6 (six) hours as needed (for moderate pain). What changed:  medication strength  when to take this  reasons to take this   polyethylene glycol packet Commonly known as:  MIRALAX / GLYCOLAX Take 17 g by mouth daily.   predniSONE 20 MG tablet Commonly known as:  DELTASONE Take 3 tablets for 2 days the 2 tablets for  4 days then stop.   senna-docusate 8.6-50 MG tablet Commonly known as:  Senokot-S Take 1 tablet by mouth 2 (two) times daily.   tamsulosin 0.4 MG Caps capsule Commonly known as:  FLOMAX Take 1 capsule (0.4 mg total) by mouth daily.            Discharge Care  Instructions        Start     Ordered   06/07/17 0000  Multiple Vitamin (MULTIVITAMIN WITH MINERALS) TABS tablet  Daily     06/06/17 1010   06/07/17 0000  polyethylene glycol (MIRALAX / GLYCOLAX) packet  Daily     06/06/17 1010   06/07/17 0000  tamsulosin (FLOMAX) 0.4 MG CAPS capsule  Daily     06/06/17 1010   06/06/17 0000  carvedilol (COREG) 3.125 MG tablet  2 times daily with meals     06/06/17 1010   06/06/17 0000  mirtazapine (REMERON) 7.5 MG tablet  Daily at bedtime     06/06/17 1010   06/06/17 0000  oxyCODONE (OXY IR/ROXICODONE) 5 MG immediate release tablet  Every 6 hours PRN     06/06/17 1010   06/06/17 0000  predniSONE (DELTASONE) 20 MG tablet     06/06/17 1010   06/06/17 0000  senna-docusate (SENOKOT-S) 8.6-50 MG tablet  2 times daily     06/06/17 1010   06/06/17 0000  Increase activity slowly     06/06/17 1011   06/06/17 0000  Diet - low sodium heart healthy     06/06/17 1011     Follow-up Information    Consuella Lose, MD. Schedule an appointment as soon as possible for a visit in 2 week(s).   Specialty:  Neurosurgery Contact information: [address] [place] [address] [phone]          Allergies  Allergen Reactions  . Amlodipine Swelling  . Meperidine Hcl Other (See Comments)    hallucinations  . Naproxen Other (See Comments)    Extreme bruising    Consultations:  Neurosurgery    Procedures/Studies: Dg Chest 2 View  Result Date: 05/29/2017 CLINICAL DATA:  Chest pain for 5 days. Personal history of lung carcinoma. COPD. EXAM: CHEST  2 VIEW COMPARISON:  05/19/2017 FINDINGS: The heart size and mediastinal contours are within normal limits. Aortic atherosclerosis. Severe emphysema again demonstrated as well as bibasilar scarring. No evidence of pulmonary infiltrate or edema. No evidence of pneumothorax or pleural effusion. The visualized skeletal structures are unremarkable. Diffuse pancreatic calcification again seen,  consistent with chronic pancreatitis. IMPRESSION: Emphysema and bibasilar scarring.  No active lung disease. Electronically Signed   By: Earle Gell M.D.   On: 05/29/2017 15:46   Dg Chest 2 View  Result Date: 05/19/2017 CLINICAL DATA:  Increased cough.  History of metastatic lung cancer. EXAM: CHEST  2 VIEW COMPARISON:  05/10/2017 chest x-ray 04/24/2017 CT chest FINDINGS: Bilateral emphysematous changes. Bilateral diffuse lower lung  chronic interstitial thickening with more patchy left lower lobe airspace disease which may reflect fibrosis. No new focal consolidation. No pleural effusion or pneumothorax. Stable cardiomediastinal silhouette. No acute osseous abnormality. IMPRESSION: Accounting for differences in technique, no interval change compared with 05/10/2017. No acute cardiopulmonary disease. Electronically Signed   By: Kathreen Devoid   On: 05/19/2017 12:05   Dg Chest 2 View  Result Date: 05/10/2017 CLINICAL DATA:  Diffuse pain.  History of lung carcinoma EXAM: CHEST  2 VIEW COMPARISON:  March 10, 2017 chest radiograph; April 24, 2017 chest CT FINDINGS: There is atelectatic change in the left base. There is extensive underlying emphysematous change with areas of scarring and patchy fibrosis. Heart is upper normal in size with pulmonary vascular within normal limits. No evident adenopathy. There is aortic atherosclerosis. No bone lesions. IMPRESSION: Extensive underlying emphysematous change with patchy fibrosis. Atelectasis left base. No frank consolidation. Heart upper normal in size. No adenopathy evident. There is aortic atherosclerosis. Aortic Atherosclerosis (ICD10-I70.0) and Emphysema (ICD10-J43.9). Electronically Signed   By: Lowella Grip III M.D.   On: 05/10/2017 19:58   Ct Head Wo Contrast  Result Date: 05/19/2017 CLINICAL DATA:  Ataxia EXAM: CT HEAD WITHOUT CONTRAST TECHNIQUE: Contiguous axial images were obtained from the base of the skull through the vertex without intravenous contrast.  COMPARISON:  Brain MRI 04/25/2017 FINDINGS: Brain: No evidence of acute infarction, hemorrhage, hydrocephalus, extra-axial collection. No abnormality detected in the cerebellum or brainstem. Low-density near the left posterior corpus callosum is a treated brain metastasis. No mass effect or hemorrhage. Chronic white matter disease, mild. Vascular: Atherosclerotic calcification.  No hyperdense vessel. Skull: No acute or aggressive finding.Burr-hole for biopsy in the left parietal bone. Sinuses/Orbits: Negative IMPRESSION: 1. No acute finding. 2. Treated brain metastasis without mass effect/swelling. Electronically Signed   By: Monte Fantasia M.D.   On: 05/19/2017 12:25   Mr Thoracic Spine W Wo Contrast  Result Date: 06/01/2017 CLINICAL DATA:  61 y/o F; metastatic lung cancer and worsening lower back pain. Acute urinary retention. EXAM: MRI THORACIC AND LUMBAR SPINE WITHOUT AND WITH CONTRAST TECHNIQUE: Multiplanar and multiecho pulse sequences of the thoracic and lumbar spine were obtained without and with intravenous contrast. CONTRAST:  68m MULTIHANCE GADOBENATE DIMEGLUMINE 529 MG/ML IV SOLN COMPARISON:  03/29/2017 CT of the abdomen and pelvis. 05/02/2013 lumbar spine MRI. 04/24/2017 and 03/10/2017 CT of the chest. FINDINGS: MRI THORACIC SPINE FINDINGS Moderate motion artifact. Alignment:  Physiologic. Vertebrae: Moderate 50% T8 vertebral body compression deformity with edema and enhancement. T2, T3, T4, and T11 mild superior endplate depressions with edema and enhancement. Chronic mild superior endplate deformities of the T5 through T7 vertebral bodies. No expansile mass lesion is identified. Cord: Normal signal and morphology. No abnormal enhancement identified. No epidural disease identified. Paraspinal and other soft tissues: Right azygoesophageal recess soft tissue lesion measuring 20 x 28 mm, probably lymphadenopathy, mildly increased in size from prior CT of chest. Disc levels: No significant disc  displacement, foraminal stenosis, or canal stenosis. MRI LUMBAR SPINE FINDINGS Moderate motion artifact. Segmentation:  Standard. Alignment:  Physiologic. Vertebrae: The L5-S1 interbody fusion prosthesis produces susceptibility artifact partially obscuring the adjacent vertebral bodies and locally disturbance fat suppression. No bone marrow edema or abnormal enhancement. The Conus medullaris: Extends to the L1 level and appears normal. No abnormal enhancement identified. No epidural disease identified. Paraspinal and other soft tissues: Postsurgical changes within paraspinal muscles and subcutaneous fat related to left-sided L5-S1 laminectomy and disc replacement. A discrete fluid collection. Disc levels:  L1-2: No significant disc displacement, foraminal narrowing, or canal stenosis. L2-3: Minimal disc bulge. No significant foraminal or canal stenosis. L3-4: Minimal disc bulge. No significant foraminal or canal stenosis. L4-5: Small disc bulge with mild facet hypertrophy. Mild bilateral foraminal and lateral recess stenosis. No significant canal stenosis. L5-S1: Discectomy. Mild facet hypertrophy. No significant foraminal or canal stenosis. IMPRESSION: 1. Moderate motion artifact of thoracic and lumbar spine MRI. 2. Moderate T8 vertebral body compression deformity with edema and enhancement indicating recent injury. 3. T2, T3, T4, and T11 mild superior endplate depressions with edema and enhancement indicating recent injury. 4. No expansile mass lesion to suggest bony metastatic disease, although pathologic fracture deformity is possible in the T8 vertebral body which is diffusely enhancing. Follow-up is recommended. 5. No bony retropulsion or significant thoracic spinal canal stenosis. 6. No abnormal signal or enhancement of the thoracic and lumbar spinal cord or the cauda equina. 7. No epidural disease identified. 8. Mild lower lumbar spine degenerative changes without significant foraminal narrowing or canal  stenosis. 9. Right azygoesophageal recess soft tissue lesion, probably lymphadenopathy, appears mildly increased in size from prior CT of chest given differences in technique. Electronically Signed   By: Kristine Garbe M.D.   On: 06/01/2017 19:54   Mr Lumbar Spine W Wo Contrast  Result Date: 06/01/2017 CLINICAL DATA:  61 y/o F; metastatic lung cancer and worsening lower back pain. Acute urinary retention. EXAM: MRI THORACIC AND LUMBAR SPINE WITHOUT AND WITH CONTRAST TECHNIQUE: Multiplanar and multiecho pulse sequences of the thoracic and lumbar spine were obtained without and with intravenous contrast. CONTRAST:  63m MULTIHANCE GADOBENATE DIMEGLUMINE 529 MG/ML IV SOLN COMPARISON:  03/29/2017 CT of the abdomen and pelvis. 05/02/2013 lumbar spine MRI. 04/24/2017 and 03/10/2017 CT of the chest. FINDINGS: MRI THORACIC SPINE FINDINGS Moderate motion artifact. Alignment:  Physiologic. Vertebrae: Moderate 50% T8 vertebral body compression deformity with edema and enhancement. T2, T3, T4, and T11 mild superior endplate depressions with edema and enhancement. Chronic mild superior endplate deformities of the T5 through T7 vertebral bodies. No expansile mass lesion is identified. Cord: Normal signal and morphology. No abnormal enhancement identified. No epidural disease identified. Paraspinal and other soft tissues: Right azygoesophageal recess soft tissue lesion measuring 20 x 28 mm, probably lymphadenopathy, mildly increased in size from prior CT of chest. Disc levels: No significant disc displacement, foraminal stenosis, or canal stenosis. MRI LUMBAR SPINE FINDINGS Moderate motion artifact. Segmentation:  Standard. Alignment:  Physiologic. Vertebrae: The L5-S1 interbody fusion prosthesis produces susceptibility artifact partially obscuring the adjacent vertebral bodies and locally disturbance fat suppression. No bone marrow edema or abnormal enhancement. The Conus medullaris: Extends to the L1 level and  appears normal. No abnormal enhancement identified. No epidural disease identified. Paraspinal and other soft tissues: Postsurgical changes within paraspinal muscles and subcutaneous fat related to left-sided L5-S1 laminectomy and disc replacement. A discrete fluid collection. Disc levels: L1-2: No significant disc displacement, foraminal narrowing, or canal stenosis. L2-3: Minimal disc bulge. No significant foraminal or canal stenosis. L3-4: Minimal disc bulge. No significant foraminal or canal stenosis. L4-5: Small disc bulge with mild facet hypertrophy. Mild bilateral foraminal and lateral recess stenosis. No significant canal stenosis. L5-S1: Discectomy. Mild facet hypertrophy. No significant foraminal or canal stenosis. IMPRESSION: 1. Moderate motion artifact of thoracic and lumbar spine MRI. 2. Moderate T8 vertebral body compression deformity with edema and enhancement indicating recent injury. 3. T2, T3, T4, and T11 mild superior endplate depressions with edema and enhancement indicating recent injury. 4. No expansile mass  lesion to suggest bony metastatic disease, although pathologic fracture deformity is possible in the T8 vertebral body which is diffusely enhancing. Follow-up is recommended. 5. No bony retropulsion or significant thoracic spinal canal stenosis. 6. No abnormal signal or enhancement of the thoracic and lumbar spinal cord or the cauda equina. 7. No epidural disease identified. 8. Mild lower lumbar spine degenerative changes without significant foraminal narrowing or canal stenosis. 9. Right azygoesophageal recess soft tissue lesion, probably lymphadenopathy, appears mildly increased in size from prior CT of chest given differences in technique. Electronically Signed   By: Kristine Garbe M.D.   On: 06/01/2017 19:54   Dg Chest Port 1 View  Result Date: 06/02/2017 CLINICAL DATA:  Increasing shortness of breath, chest congestion. EXAM: PORTABLE CHEST 1 VIEW COMPARISON:  Chest  radiograph May 29, 2017 FINDINGS: Cardiac silhouette is mildly enlarged. Calcified aortic knob. Diffuse interstitial prominence, confluent in the lung bases without pleural effusion or focal consolidation. No pneumothorax. Surgical clips in the included right abdomen compatible with cholecystectomy. Coarse calcifications in the upper abdomen are likely pancreatic. IMPRESSION: COPD with increasing bibasilar interstitial prominence, possible atypical infection. LEFT lung base atelectasis/scarring. Electronically Signed   By: Elon Alas M.D.   On: 06/02/2017 05:19   Ct Renal Stone Study  Result Date: 05/29/2017 CLINICAL DATA:  Chronic back pain with new LEFT flank pain for 3 days. Suspect kidney stones. History of lung cancer. EXAM: CT ABDOMEN AND PELVIS WITHOUT CONTRAST TECHNIQUE: Multidetector CT imaging of the abdomen and pelvis was performed following the standard protocol without IV contrast. COMPARISON:  Abdominal radiograph Feb 16, 2017 and PET-CT May 14, 2016 and CT chest May 27, 2016 FINDINGS: LOWER CHEST: Centrilobular emphysema. LEFT lower lobe bronchiectasis with unchanged mucoid impaction. Smaller approximate 18 mm consolidation LEFT lower lobe at site of prior biopsy. No pleural effusion. Bilateral small fat containing diaphragmatic hernias. Heart size is normal. No pericardial effusions. Mild gas distended distal esophagus associated with reflux disease. HEPATOBILIARY: Nodular liver contour consistent with cirrhosis. Noncontrast CT may be insensitive for small tumor. Status post cholecystectomy. PANCREAS: Diffusely calcified pancreas compatible chronic pancreatitis. No acute component. SPLEEN: Normal. ADRENALS/URINARY TRACT: Kidneys are orthotopic, demonstrating normal size and morphology. No nephrolithiasis, hydronephrosis; limited assessment for renal masses on this nonenhanced examination. 2.3 cm RIGHT lower pole cyst, homogeneously hypodense and hypometabolic by prior PET-CT. The  unopacified ureters are normal in course and caliber. Urinary bladder is well distended and unremarkable. Normal adrenal glands. STOMACH/BOWEL: The stomach, small and large bowel are normal in course and caliber without inflammatory changes, sensitivity decreased by lack of enteric contrast. Mild sigmoid colonic diverticulosis. Normal appendix. VASCULAR/LYMPHATIC: Aortoiliac vessels are normal in course and caliber. Moderate calcific atherosclerosis. No lymphadenopathy by CT size criteria. REPRODUCTIVE: 17 mm subserosal fundal leiomyoma versus homogeneously hypodense LEFT adnexae. OTHER: No intraperitoneal free fluid or free air. MUSCULOSKELETAL: New mild T11 compression fracture. Osteopenia. Cachexia. Mild broad dextroscoliosis. L5-S1 disc prosthesis with arthrodesis. IMPRESSION: 1. No urolithiasis, obstructive uropathy nor acute intra-abdominal/pelvic process. 2. New age indeterminate mild T11 compression fracture. 3. Cirrhosis without CT findings of portal hypertension. 4. Chronic pancreatitis. Aortic Atherosclerosis (ICD10-I70.0) and Emphysema (ICD10-J43.9). Electronically Signed   By: Elon Alas M.D.   On: 05/29/2017 16:22    (Echo, Carotid, EGD, Colonoscopy, ERCP)    Subjective:   Discharge Exam: Vitals:   06/06/17 0522 06/06/17 0959  BP: (!) 146/65   Pulse: 70   Resp: 16   Temp: 98.4 F (36.9 C)   SpO2: 100% 97%  Vitals:   06/05/17 2020 06/05/17 2058 06/06/17 0522 06/06/17 0959  BP:  (!) 143/66 (!) 146/65   Pulse:  77 70   Resp:  16 16   Temp:  98.2 F (36.8 C) 98.4 F (36.9 C)   TempSrc:  Oral Oral   SpO2: 97% 99% 100% 97%  Weight:      Height:        General: Pt is alert, awake, not in acute distress Cardiovascular: RRR, S1/S2 +, no rubs, no gallops Respiratory: CTA bilaterally, no wheezing, no rhonchi Abdominal: Soft, NT, ND, bowel sounds + Extremities: no edema, no cyanosis    The results of significant diagnostics from this hospitalization (including  imaging, microbiology, ancillary and laboratory) are listed below for reference.     Microbiology: No results found for this or any previous visit (from the past 240 hour(s)).   Labs: BNP (last 3 results)  Recent Labs  01/06/17 0153 02/12/17 1331 03/10/17 2020  BNP 351.2* 412.7* 161.0*   Basic Metabolic Panel:  Recent Labs Lab 05/31/17 0558 06/01/17 0533 06/03/17 0546 06/03/17 1157 06/04/17 0639 06/05/17 0523  NA 137 135 135  --  134* 132*  K 4.9 4.3 4.5  --  5.0 4.5  CL 113* 109 103  --  98* 98*  CO2 18* 22 26  --  30 27  GLUCOSE 102* 127* 117* 393* 161* 264*  BUN _0 --  26* 30*  CREATININE 0.70 0.51 0.68  --  1.04* 1.00  CALCIUM 8.7* 8.8* 9.2  --  9.1 9.0  MG 1.4* 1.7  --   --   --   --    Liver Function Tests: No results for input(s): AST, ALT, ALKPHOS, BILITOT, PROT, ALBUMIN in the last 168 hours. No results for input(s): LIPASE, AMYLASE in the last 168 hours. No results for input(s): AMMONIA in the last 168 hours. CBC:  Recent Labs Lab 06/01/17 0533 06/03/17 0546 06/04/17 0639 06/05/17 0523  WBC 5.8 12.1* 14.8* 9.3  HGB 13.8 12.4 11.7* 11.1*  HCT 39.9 36.0 35.4* 34.4*  MCV 88.7 90.0 92.4 92.5  PLT 189 192 213 210   Cardiac Enzymes: No results for input(s): CKTOTAL, CKMB, CKMBINDEX, TROPONINI in the last 168 hours. BNP: Invalid input(s): POCBNP CBG:  Recent Labs Lab 06/05/17 1144 06/05/17 1631 06/05/17 2053 06/06/17 0729 06/06/17 0842  GLUCAP 194* 210* 158* 55* 107*   D-Dimer No results for input(s): DDIMER in the last 72 hours. Hgb A1c No results for input(s): HGBA1C in the last 72 hours. Lipid Profile No results for input(s): CHOL, HDL, LDLCALC, TRIG, CHOLHDL, LDLDIRECT in the last 72 hours. Thyroid function studies No results for input(s): TSH, T4TOTAL, T3FREE, THYROIDAB in the last 72 hours.  Invalid input(s): FREET3 Anemia work up No results for input(s): VITAMINB12, FOLATE, FERRITIN, TIBC, IRON, RETICCTPCT in the last 72  hours. Urinalysis    Component Value Date/Time   COLORURINE YELLOW 05/29/2017 2034   APPEARANCEUR CLEAR 05/29/2017 2034   LABSPEC 1.010 05/29/2017 2034   PHURINE 6.0 05/29/2017 2034   GLUCOSEU NEGATIVE 05/29/2017 2034   GLUCOSEU NEGATIVE 12/16/2016 1444   HGBUR NEGATIVE 05/29/2017 2034   BILIRUBINUR NEGATIVE 05/29/2017 2034   KETONESUR NEGATIVE 05/29/2017 2034   PROTEINUR NEGATIVE 05/29/2017 2034   UROBILINOGEN 0.2 12/16/2016 1444   NITRITE NEGATIVE 05/29/2017 2034   LEUKOCYTESUR TRACE (A) 05/29/2017 2034   Sepsis Labs Invalid input(s): PROCALCITONIN,  WBC,  LACTICIDVEN Microbiology No results found for this or any previous visit (  from the past 240 hour(s)).   Time coordinating discharge: Over 30 minutes  SIGNED:   Elmarie Shiley, MD  [facility] 06/06/2017, 10:12 AM Pager   If 7PM-7AM, please contact night-coverage [email or web address] Password [email or web address]

## 2017-06-06 NOTE — Care Management Note (Signed)
Case Management Note  Patient Details  Name: Phyllis [name] MRN: [id number] Date of Birth: [birth year]  Subjective/Objective:           discharged         Action/Plan:   Expected Discharge Date:  06/06/17               Expected Discharge Plan:  [place]  In-House Referral:     Discharge planning Services  CM Consult  Post Acute Care Choice:  Resumption of Svcs/PTA Provider Choice offered to:     DME Arranged:    DME Agency:  [place]:  RN, PT, OT, Nurse's Aide, Social Work [employer] Agency:     Status of Service:  Completed, signed off  If discussed at [employer], dates discussed:    Additional Comments:  Leeroy Cha, RN 06/06/2017, 10:28 AM

## 2017-06-11 ENCOUNTER — Encounter (HOSPITAL_COMMUNITY): Payer: Self-pay

## 2017-06-11 ENCOUNTER — Emergency Department (HOSPITAL_COMMUNITY)
Admission: EM | Admit: 2017-06-11 | Discharge: 2017-06-11 | Disposition: A | Discharge status: Home / Self Care | Payer: Medicare Other | Attending: Emergency Medicine | Admitting: Emergency Medicine

## 2017-06-11 ENCOUNTER — Emergency Department (HOSPITAL_COMMUNITY): Payer: Medicare Other

## 2017-06-11 DIAGNOSIS — Z87891 Personal history of nicotine dependence: Secondary | ICD-10-CM | POA: Insufficient documentation

## 2017-06-11 DIAGNOSIS — I10 Essential (primary) hypertension: Secondary | ICD-10-CM | POA: Insufficient documentation

## 2017-06-11 DIAGNOSIS — Z79899 Other long term (current) drug therapy: Secondary | ICD-10-CM | POA: Diagnosis not present

## 2017-06-11 DIAGNOSIS — E039 Hypothyroidism, unspecified: Secondary | ICD-10-CM | POA: Insufficient documentation

## 2017-06-11 DIAGNOSIS — I119 Hypertensive heart disease without heart failure: Secondary | ICD-10-CM | POA: Diagnosis not present

## 2017-06-11 DIAGNOSIS — R0602 Shortness of breath: Secondary | ICD-10-CM | POA: Diagnosis not present

## 2017-06-11 DIAGNOSIS — G894 Chronic pain syndrome: Secondary | ICD-10-CM | POA: Diagnosis not present

## 2017-06-11 DIAGNOSIS — Z85118 Personal history of other malignant neoplasm of bronchus and lung: Secondary | ICD-10-CM | POA: Diagnosis not present

## 2017-06-11 DIAGNOSIS — C7989 Secondary malignant neoplasm of other specified sites: Secondary | ICD-10-CM | POA: Diagnosis not present

## 2017-06-11 DIAGNOSIS — Z7982 Long term (current) use of aspirin: Secondary | ICD-10-CM | POA: Diagnosis not present

## 2017-06-11 DIAGNOSIS — C3490 Malignant neoplasm of unspecified part of unspecified bronchus or lung: Secondary | ICD-10-CM | POA: Insufficient documentation

## 2017-06-11 DIAGNOSIS — R9431 Abnormal electrocardiogram [ECG] [EKG]: Secondary | ICD-10-CM | POA: Diagnosis not present

## 2017-06-11 DIAGNOSIS — C799 Secondary malignant neoplasm of unspecified site: Secondary | ICD-10-CM | POA: Diagnosis not present

## 2017-06-11 DIAGNOSIS — J449 Chronic obstructive pulmonary disease, unspecified: Secondary | ICD-10-CM | POA: Diagnosis not present

## 2017-06-11 DIAGNOSIS — J45909 Unspecified asthma, uncomplicated: Secondary | ICD-10-CM | POA: Insufficient documentation

## 2017-06-11 LAB — COMPREHENSIVE METABOLIC PANEL
ALBUMIN: 3.1 g/dL — AB (ref 3.5–5.0)
ALT: 25 U/L (ref 14–54)
ANION GAP: 5 (ref 5–15)
AST: 27 U/L (ref 15–41)
Alkaline Phosphatase: 97 U/L (ref 38–126)
BUN: 10 mg/dL (ref 6–20)
CO2: 37 mmol/L — AB (ref 22–32)
Calcium: 8.8 mg/dL — ABNORMAL LOW (ref 8.9–10.3)
Chloride: 91 mmol/L — ABNORMAL LOW (ref 101–111)
Creatinine, Ser: 0.63 mg/dL (ref 0.44–1.00)
GFR calc Af Amer: >60 mL/min (ref >60)
GFR calc non Af Amer: >60 mL/min (ref >60)
GLUCOSE: 168 mg/dL — AB (ref 65–99)
POTASSIUM: 5.2 mmol/L — AB (ref 3.5–5.1)
SODIUM: 133 mmol/L — AB (ref 135–145)
TOTAL PROTEIN: 6.2 g/dL — AB (ref 6.5–8.1)
Total Bilirubin: 0.3 mg/dL (ref 0.3–1.2)

## 2017-06-11 LAB — CBC
HEMATOCRIT: 44.8 % (ref 36.0–46.0)
HEMOGLOBIN: 13.7 g/dL (ref 12.0–15.0)
MCH: 30.2 pg (ref 26.0–34.0)
MCHC: 30.6 g/dL (ref 30.0–36.0)
MCV: 98.9 fL (ref 78.0–100.0)
Platelets: 172 10*3/uL (ref 150–400)
RBC: 4.53 MIL/uL (ref 3.87–5.11)
RDW: 13.2 % (ref 11.5–15.5)
WBC: 7.3 10*3/uL (ref 4.0–10.5)

## 2017-06-11 LAB — I-STAT TROPONIN, ED: Troponin i, poc: 0.01 ng/mL (ref 0.00–0.08)

## 2017-06-11 LAB — LIPASE, BLOOD: Lipase: 26 U/L (ref 11–51)

## 2017-06-11 MED ORDER — HYDROMORPHONE HCL 1 MG/ML IJ SOLN
1.0000 mg | Freq: Once | INTRAMUSCULAR | Status: AC
  Administered 2017-06-11: 1 mg via INTRAVENOUS
  Filled 2017-06-11: qty 1

## 2017-06-11 MED ORDER — SODIUM CHLORIDE 0.9 % IV BOLUS (SEPSIS)
1000.0000 mL | Freq: Once | INTRAVENOUS | Status: AC
  Administered 2017-06-11: 1000 mL via INTRAVENOUS

## 2017-06-11 NOTE — Care Management Note (Signed)
Case Management Note  Patient Details  Name: Phyllis [name] MRN: [id number] Date of Birth: Oct 23, 1955  Subjective/Objective: 61 y.o. F seen in the ED. CM received call that pt c/o no follow up by [facility] agency after discharge. Review of chart revealed pt had refused care. MD has reordered and I have spoken with pt who has chosen [facility] to provide care beginning today. CM has alerted Jermaine. Rep for that agency, who assures me care will begin ASAP.                    Action/Plan:CM will sign off for now but will be available should additional discharge needs arise or disposition change.    Expected Discharge Date:                  Expected Discharge Plan:     In-House Referral:  Clinical Social Work  Discharge planning Services  CM Consult  Post Acute Care Choice:  Home Health (pt did not want DME) Choice offered to:  Patient  DME Arranged:  N/A DME Agency:  [place]:  RN, PT, OT, Social Work, Nurse's Aide [place] Agency:  [place]  Status of Service:  Completed, signed off  If discussed at [employer], dates discussed:    Additional Comments:  Delrae Sawyers, RN 06/11/2017, 10:09 AM

## 2017-06-11 NOTE — ED Provider Notes (Signed)
[facility] DEPT Provider Note   CSN: [id number] Arrival date & time: 06/11/17  9622     History   Chief Complaint Chief Complaint  Patient presents with  . Abdominal Pain  . Shortness of Breath    HPI Phyllis [name] is a 61 y.o. female history of hypertension, metastatic non-small cell Lung cancer not currently on chemotherapy, chronic pain syndrome, here presenting with worsening diffuse pain, subjective shortness of breath. Patient states that she just got out of the hospital about a week ago. That time she was admitted for acute renal failure and had urinary retention and Foley was placed and then was subsequently removed. She has been able to urinate since then. She has been and diffuse pain which is chronic for her. She is on fentanyl patch that was changed yesterday as well as oxycodone for breakthrough pain. She states that the pain has not been very well controlled. She states that she has pain all over. She has some subjective shortness of breath and is on 3 L nasal cannula which is baseline for her. Denies cough or fevers or vomiting. Per the husband, home care suppose to come with an aide and physical therapy for several months but didn't come recently.   The history is provided by the patient and the spouse.    Past Medical History:  Diagnosis Date  . Anxiety   . Asthma   . CHF (congestive heart failure) ([place])   . Chronic back pain    "in the small of my back and lower back" (01/11/2017)  . Chronic bronchitis ([place])   . Chronic pancreatitis ([place])   . COPD with emphysema ([place])   . DEPRESSION   . Eczema   . Edema 05/20/2010  . GERD 06/06/2009  . Hepatitis C    "took the treatment; I was cured" (01/11/2017)  . High cholesterol   . HYPERTENSION 06/06/2009  . Non-small cell lung cancer (NSCLC) ([place])    NSCLC/squamous cell with bilateral pulmonary nodules treated with stereotactic radiotherapy during the fall of 2017./notes 01/11/2017  . On home oxygen therapy    "3L;  24/7" (01/11/2017)  . OSTEOPOROSIS 06/06/2009  . 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 Pre-diabetes   . Prolonged QT interval 01/07/2017  . Squamous cell carcinoma 06/15/2016  . TOBACCO USE 06/05/2010  . Unspecified hypothyroidism 06/05/2010  . VITAMIN D DEFICIENCY 06/06/2009    Patient Active Problem List   Diagnosis Date Noted  . Malnutrition of moderate degree 05/31/2017  . Thrombocytopenia ([place]) 04/23/2017  . Cancer-related pain 03/02/2017  . Bipolar disease, chronic ([place]) 02/12/2017  . Chronic diastolic heart failure, NYHA class 1 ([place]) 02/12/2017  . Protein-calorie malnutrition, severe 02/09/2017  . Opiate dependence, continuous ([place]) 02/08/2017  . AKI (acute kidney injury) ([facility]) 02/08/2017  . Brain metastasis ([place])   . HLD (hyperlipidemia) 01/05/2017  . Chronic diastolic CHF (congestive heart failure) ([place]) 01/05/2017  . Squamous cancer of left lower lobe of lung ([place]) 06/15/2016  . Type II diabetes mellitus with manifestations ([place]) 04/08/2015  . Pulmonary hypertension due to COPD ([place])   . COPD (chronic obstructive pulmonary disease) with chronic bronchitis ([place]) 09/30/2014  . Visit for screening mammogram 09/30/2014  . Routine general medical examination at a health care facility 09/30/2014  . Hypokalemia 02/21/2014  . Acute renal failure (ARF) ([place]) 03/05/2013  . CAD (coronary artery disease), native coronary artery 03/05/2013  . Right lumbar radiculitis 08/06/2010  . Hypothyroidism 06/05/2010  . TOBACCO USE 06/05/2010  . Hep  C w/o coma, chronic ([place]) 06/06/2009  . Depression with anxiety 06/06/2009  . GERD 06/06/2009    Past Surgical History:  Procedure Laterality Date  . APPLICATION OF CRANIAL NAVIGATION Left 01/19/2017   Procedure: APPLICATION OF CRANIAL NAVIGATION;  Surgeon: Consuella Lose, MD;  Location: [facility];  Service: Neurosurgery;  Laterality: Left;  . BRAIN SURGERY    . BREAST BIOPSY Right ~ 2000   "needle biopsy"   . CARDIAC  CATHETERIZATION    . COLONOSCOPY    . DILATION AND CURETTAGE OF UTERUS    . LAPAROSCOPIC CHOLECYSTECTOMY    . LEFT HEART CATHETERIZATION WITH CORONARY ANGIOGRAM N/A 03/08/2013   Procedure: LEFT HEART CATHETERIZATION WITH CORONARY ANGIOGRAM;  Surgeon: Clent Demark, MD;  Location[facility] C[facility] LAB;  Service: Cardiovascular;  Laterality: N/A;  . LUMBAR FUSION    . LUMBAR LAMINECTOMY    . PR DURAL GRAFT REPAIR,SPINE DEFECT Left 01/19/2017   Procedure: LEFT CRANIOTOMY FOR STERIOTACTIC BRAIN BIOPSY;  Surgeon: Consuella Lose, MD;  Location: [facility];  Service: Neurosurgery;  Laterality: Left;  LEFT CRANIOTOMY FOR STERIOTACTIC BRAIN BIOPSY  . TONSILLECTOMY    . TUBAL LIGATION      OB History    No data available       Home Medications    Prior to Admission medications   Medication Sig Start Date End Date Taking? Authorizing Provider  ALPRAZolam Duanne Moron) 0.5 MG tablet Take 1 tablet (0.5 mg total) by mouth 3 (three) times daily as needed. for anxiety 05/26/17   [name], MD  aspirin EC 81 MG tablet Take 81 mg by mouth daily.    [provider]  blood glucose meter kit and supplies KIT Dispense based on patient and insurance preference. Use up to four times daily as directed. (FOR ICD-9 250.00, 250.01). 02/17/17   Hongalgi, Lenis Dickinson, MD  carvedilol (COREG) 3.125 MG tablet Take 1 tablet (3.125 mg total) by mouth 2 (two) times daily with a meal. 06/06/17   Regalado, Belkys A, MD  fentaNYL (DURAGESIC - DOSED MCG/HR) 50 MCG/HR Place 1 patch (50 mcg total) onto the skin every 3 (three) days. 05/26/17   [name], MD  gabapentin (NEURONTIN) 300 MG capsule Take 1 capsule (300 mg total) by mouth 2 (two) times daily. 02/17/17   Hongalgi, Lenis Dickinson, MD  Glycopyrrolate [facility] MAGNAIR REFILL KIT) 25 MCG/ML SOLN Inhale 1 Act into the lungs 2 (two) times daily. 05/13/17   [name], MD  magnesium oxide (MAG-OX) 400 MG tablet Take 1 tablet (400 mg total) by mouth 3 (three) times daily after meals.  04/25/17   Aline August, MD  mirtazapine (REMERON) 7.5 MG tablet Take 1 tablet (7.5 mg total) by mouth at bedtime. 06/06/17   Regalado, Belkys A, MD  Multiple Vitamin (MULTIVITAMIN WITH MINERALS) TABS tablet Take 1 tablet by mouth daily. 06/07/17   Regalado, Belkys A, MD  oxyCODONE (OXY IR/ROXICODONE) 5 MG immediate release tablet Take 1 tablet (5 mg total) by mouth every 6 (six) hours as needed (for moderate pain). 06/06/17   Regalado, Belkys A, MD  polyethylene glycol (MIRALAX / GLYCOLAX) packet Take 17 g by mouth daily. 06/07/17   Regalado, Belkys A, MD  predniSONE (DELTASONE) 20 MG tablet Take 3 tablets for 2 days the 2 tablets for  4 days then stop. 06/06/17   Regalado, Belkys A, MD  senna-docusate (SENOKOT-S) 8.6-50 MG tablet Take 1 tablet by mouth 2 (two) times daily. 06/06/17   Regalado, Cassie Freer, MD  tamsulosin (FLOMAX) 0.4 MG CAPS capsule Take 1 capsule (0.4 mg total) by mouth daily. 06/07/17   Regalado, Cassie Freer, MD    Family History Family History  Problem Relation Age of Onset  . COPD Father   . Hypertension Mother   . Kidney disease Mother   . Colon cancer Neg Hx   . Stomach cancer Neg Hx     Social History Social History  Substance Use Topics  . Smoking status: Former Smoker    Packs/day: 0.10    Years: 35.00    Types: Cigarettes    Quit date: 04/03/2015  . Smokeless tobacco: Never Used  . Alcohol use No     Allergies   Amlodipine; Meperidine hcl; and Naproxen   Review of Systems Review of Systems  Respiratory: Positive for shortness of breath.   Gastrointestinal: Positive for abdominal pain.  All other systems reviewed and are negative.    Physical Exam Updated Vital Signs BP (!) 144/81 (BP Location: Left Arm)   Pulse 77   Resp (!) 24   LMP 10/05/1999   SpO2 92%   Physical Exam  Constitutional: She is oriented to person, place, and time.  Chronically ill, uncomfortable   HENT:  Head: Normocephalic.  MM slightly dry   Eyes: Pupils are equal, round, and  reactive to light. Conjunctivae and EOM are normal.  Neck: Normal range of motion. Neck supple.  Cardiovascular: Normal rate, regular rhythm and normal heart sounds.   Pulmonary/Chest: Effort normal and breath sounds normal. No respiratory distress. She has no wheezes.  Abdominal: Soft. Bowel sounds are normal. She exhibits no distension. There is no tenderness.  Musculoskeletal: Normal range of motion.  Neurological: She is alert and oriented to person, place, and time. No cranial nerve deficit. Coordination normal.  Skin: Skin is warm.  Psychiatric: She has a normal mood and affect.  Nursing note and vitals reviewed.    ED Treatments / Results  Labs (all labs ordered are listed, but only abnormal results are displayed) Labs Reviewed  LIPASE, BLOOD  COMPREHENSIVE METABOLIC PANEL  CBC  URINALYSIS, ROUTINE W REFLEX MICROSCOPIC  I-STAT TROPONIN, ED    EKG  EKG Interpretation None       Radiology No results found.  Procedures Procedures (including critical care time)  Medications Ordered in ED Medications  sodium chloride 0.9 % bolus 1,000 mL (not administered)  HYDROmorphone (DILAUDID) injection 1 mg (not administered)     Initial Impression / Assessment and Plan / ED Course  I have reviewed the triage vital signs and the nursing notes.  Pertinent labs & imaging results that were available during my care of the patient were reviewed by me and considered in my medical decision making (see chart for details).    Kortney A Norbeck is a 61 y.o. female here with diffuse pain. Chronic pain from metastatic lung cancer with poor prognosis. Recent admission for AKI, urinary retention but is urinating well now and doesn't have a foley. Will check basic labs, UA, CXR. Husband has concerns lack of home health aide and therapy at home so will consult case management.   10:33 AM WBC nl. CXR showed atelectasis vs pneumonia. She has no infectious symptoms. Cr stable since  discharge. I called case management who talked to her. Apparently she refused home care during the last hospitalization since she "was too sick". Now she agrees with home health aide. She also was concerned that her husband may be stealing her narcotics and will get a  lock box in the future. She felt safe at home with him and doesn't want placement. No need for APS for now. Pain controlled with dilaudid, has fentanyl patch and oxycodone at home. Encouraged her to follow up with PCP, oncology, spine surgeon.    Final Clinical Impressions(s) / ED Diagnoses   Final diagnoses:  None    New Prescriptions New Prescriptions   No medications on file     Drenda Freeze, MD 06/11/17 1058

## 2017-06-11 NOTE — ED Triage Notes (Signed)
Pt here with abdominal pain, back pain and shortness of breath.  Pt under radiation for lung cancer and recently discharged from hospital.  Missed her radiation appt this month for feeling sick constantly.  Metabolic acidosis seen in diagnosis.  Pain is worsening.

## 2017-06-11 NOTE — Discharge Instructions (Signed)
Continue taking your pain medicines as prescribed.   See your oncologist and primary care doctor and spine doctor.   Case management will contact you regarding home health aide, physical therapy.   Return to ER if you have fever, severe pain, vomiting, unable to urinate, trouble breathing.

## 2017-06-13 ENCOUNTER — Telehealth: Payer: Self-pay | Admitting: Internal Medicine

## 2017-06-13 DIAGNOSIS — Z7982 Long term (current) use of aspirin: Secondary | ICD-10-CM | POA: Diagnosis not present

## 2017-06-13 DIAGNOSIS — R7303 Prediabetes: Secondary | ICD-10-CM | POA: Diagnosis not present

## 2017-06-13 DIAGNOSIS — J439 Emphysema, unspecified: Secondary | ICD-10-CM | POA: Diagnosis not present

## 2017-06-13 DIAGNOSIS — I110 Hypertensive heart disease with heart failure: Secondary | ICD-10-CM | POA: Diagnosis not present

## 2017-06-13 DIAGNOSIS — Z79891 Long term (current) use of opiate analgesic: Secondary | ICD-10-CM | POA: Diagnosis not present

## 2017-06-13 DIAGNOSIS — I509 Heart failure, unspecified: Secondary | ICD-10-CM | POA: Diagnosis not present

## 2017-06-13 DIAGNOSIS — Z9981 Dependence on supplemental oxygen: Secondary | ICD-10-CM | POA: Diagnosis not present

## 2017-06-13 DIAGNOSIS — G893 Neoplasm related pain (acute) (chronic): Secondary | ICD-10-CM | POA: Diagnosis not present

## 2017-06-13 DIAGNOSIS — Z87891 Personal history of nicotine dependence: Secondary | ICD-10-CM | POA: Diagnosis not present

## 2017-06-13 DIAGNOSIS — C3490 Malignant neoplasm of unspecified part of unspecified bronchus or lung: Secondary | ICD-10-CM | POA: Diagnosis not present

## 2017-06-13 NOTE — Telephone Encounter (Signed)
I called hospice to confirm patient was still admitted to their program. I was informed that patient is refusing hospice care again.   Are you okay with PT and Skilled Nursing?

## 2017-06-13 NOTE — Telephone Encounter (Signed)
Verbal Orders for :  Skilled nurse  & PT 1 x 5x  [phone] [facility]

## 2017-06-13 NOTE — Telephone Encounter (Signed)
yes 

## 2017-06-14 ENCOUNTER — Telehealth: Payer: Self-pay | Admitting: Internal Medicine

## 2017-06-14 NOTE — Telephone Encounter (Signed)
pls call hospice asap

## 2017-06-14 NOTE — Telephone Encounter (Signed)
Called Stacy and she stated that the spouse and pt are ready to get help from hospice.   Called spouse and spoke to pt and husband ([place]). They both agreed to the Hospice referral again.   Hospice called and referral given over the phone.

## 2017-06-14 NOTE — Telephone Encounter (Signed)
Notified spouse

## 2017-06-14 NOTE — Telephone Encounter (Signed)
Spouse states that Phyllis [name] has advised patient to get with Dr. Ronnald Ramp in regard to Palliative care for patient.  Patient has appt scheduled on 9/13.  Spouse does not believe patient is going to "make it" that long.  Spouse is requesting call back in regard as soon as possible.

## 2017-06-14 NOTE — Telephone Encounter (Signed)
Hospice called.

## 2017-06-16 ENCOUNTER — Inpatient Hospital Stay: Payer: Self-pay | Admitting: Internal Medicine

## 2017-07-04 DIAGNOSIS — Z87891 Personal history of nicotine dependence: Secondary | ICD-10-CM

## 2017-07-04 DIAGNOSIS — Z79891 Long term (current) use of opiate analgesic: Secondary | ICD-10-CM | POA: Diagnosis not present

## 2017-07-04 DIAGNOSIS — I110 Hypertensive heart disease with heart failure: Secondary | ICD-10-CM | POA: Diagnosis not present

## 2017-07-04 DIAGNOSIS — G893 Neoplasm related pain (acute) (chronic): Secondary | ICD-10-CM | POA: Diagnosis not present

## 2017-07-04 DIAGNOSIS — I509 Heart failure, unspecified: Secondary | ICD-10-CM | POA: Diagnosis not present

## 2017-07-04 DIAGNOSIS — Z7982 Long term (current) use of aspirin: Secondary | ICD-10-CM | POA: Diagnosis not present

## 2017-07-04 DIAGNOSIS — Z9981 Dependence on supplemental oxygen: Secondary | ICD-10-CM | POA: Diagnosis not present

## 2017-07-04 DIAGNOSIS — F329 Major depressive disorder, single episode, unspecified: Secondary | ICD-10-CM

## 2017-07-04 DIAGNOSIS — C3490 Malignant neoplasm of unspecified part of unspecified bronchus or lung: Secondary | ICD-10-CM | POA: Diagnosis not present

## 2017-07-04 DIAGNOSIS — R7303 Prediabetes: Secondary | ICD-10-CM | POA: Diagnosis not present

## 2017-07-04 DIAGNOSIS — J439 Emphysema, unspecified: Secondary | ICD-10-CM | POA: Diagnosis not present

## 2017-07-19 NOTE — Progress Notes (Deleted)
Pre visit review using our clinic review tool, if applicable. No additional management support is needed unless otherwise documented below in the visit note. 

## 2017-07-19 NOTE — Progress Notes (Deleted)
Subjective:   Phyllis [name] is a 61 y.o. female who presents for Medicare Annual (Subsequent) preventive examination.  Review of Systems:  No ROS.  Medicare Wellness Visit. Additional risk factors are reflected in the social history.    Sleep patterns: {SX; SLEEP PATTERNS:18802::"feels rested on waking","does not get up to void","gets up *** times nightly to void","sleeps *** hours nightly"}.    Home Safety/Smoke Alarms: Feels safe in home. Smoke alarms in place.  Living environment; residence and Firearm Safety: {Rehab home environment / accessibility:30080::"no firearms","firearms stored safely"}. Seat Belt Safety/Bike Helmet: Wears seat belt.     Objective:     Vitals: LMP 10/05/1999   There is no height or weight on file to calculate BMI.   Tobacco History  Smoking Status  . Former Smoker  . Packs/day: 0.10  . Years: 35.00  . Types: Cigarettes  . Quit date: 04/03/2015  Smokeless Tobacco  . Never Used     Counseling given: Not Answered   Past Medical History:  Diagnosis Date  . Anxiety   . Asthma   . CHF (congestive heart failure) ([place])   . Chronic back pain    "in the small of my back and lower back" (01/11/2017)  . Chronic bronchitis ([place])   . Chronic pancreatitis ([place])   . COPD with emphysema ([place])   . DEPRESSION   . Eczema   . Edema 05/20/2010  . GERD 06/06/2009  . Hepatitis C    "took the treatment; I was cured" (01/11/2017)  . High cholesterol   . HYPERTENSION 06/06/2009  . Non-small cell lung cancer (NSCLC) ([place])    NSCLC/squamous cell with bilateral pulmonary nodules treated with stereotactic radiotherapy during the fall of 2017./notes 01/11/2017  . On home oxygen therapy    "3L; 24/7" (01/11/2017)  . OSTEOPOROSIS 06/06/2009  . 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 Pre-diabetes   . Prolonged QT interval 01/07/2017  . Squamous cell carcinoma 06/15/2016  . TOBACCO USE 06/05/2010  . Unspecified hypothyroidism 06/05/2010  . VITAMIN  D DEFICIENCY 06/06/2009   Past Surgical History:  Procedure Laterality Date  . APPLICATION OF CRANIAL NAVIGATION Left 01/19/2017   Procedure: APPLICATION OF CRANIAL NAVIGATION;  Surgeon: Consuella Lose, MD;  Location: [facility];  Service: Neurosurgery;  Laterality: Left;  . BRAIN SURGERY    . BREAST BIOPSY Right ~ 2000   "needle biopsy"   . CARDIAC CATHETERIZATION    . COLONOSCOPY    . DILATION AND CURETTAGE OF UTERUS    . LAPAROSCOPIC CHOLECYSTECTOMY    . LEFT HEART CATHETERIZATION WITH CORONARY ANGIOGRAM N/A 03/08/2013   Procedure: LEFT HEART CATHETERIZATION WITH CORONARY ANGIOGRAM;  Surgeon: Clent Demark, MD;  Location[facility] CATH LAB;  Service: Cardiovascular;  Laterality: N/A;  . LUMBAR FUSION    . LUMBAR LAMINECTOMY    . PR DURAL GRAFT REPAIR,SPINE DEFECT Left 01/19/2017   Procedure: LEFT CRANIOTOMY FOR STERIOTACTIC BRAIN BIOPSY;  Surgeon: Consuella Lose, MD;  Location: [facility];  Service: Neurosurgery;  Laterality: Left;  LEFT CRANIOTOMY FOR STERIOTACTIC BRAIN BIOPSY  . TONSILLECTOMY    . TUBAL LIGATION     Family History  Problem Relation Age of Onset  . COPD Father   . Hypertension Mother   . Kidney disease Mother   . Colon cancer Neg Hx   . Stomach cancer Neg Hx    History  Sexual Activity  . Sexual activity: No    Outpatient Encounter Prescriptions as of 07/20/2017  Medication Sig  .  ALPRAZolam (XANAX) 0.5 MG tablet Take 1 tablet (0.5 mg total) by mouth 3 (three) times daily as needed. for anxiety  . aspirin EC 81 MG tablet Take 81 mg by mouth daily.  . blood glucose meter kit and supplies KIT Dispense based on patient and insurance preference. Use up to four times daily as directed. (FOR ICD-9 250.00, 250.01).  . carvedilol (COREG) 3.125 MG tablet Take 1 tablet (3.125 mg total) by mouth 2 (two) times daily with a meal.  . fentaNYL (DURAGESIC - DOSED MCG/HR) 50 MCG/HR Place 1 patch (50 mcg total) onto the skin every 3 (three) days.  . gabapentin (NEURONTIN) 300 MG  capsule Take 1 capsule (300 mg total) by mouth 2 (two) times daily.  . Glycopyrrolate (LONHALA MAGNAIR REFILL KIT) 25 MCG/ML SOLN Inhale 1 Act into the lungs 2 (two) times daily.  . oxyCODONE (OXY IR/ROXICODONE) 5 MG immediate release tablet Take 1 tablet (5 mg total) by mouth every 6 (six) hours as needed (for moderate pain).  . polyethylene glycol (MIRALAX / GLYCOLAX) packet Take 17 g by mouth daily. (Patient not taking: Reported on 06/11/2017)  . predniSONE (DELTASONE) 20 MG tablet Take 3 tablets for 2 days the 2 tablets for  4 days then stop.  . tamsulosin (FLOMAX) 0.4 MG CAPS capsule Take 1 capsule (0.4 mg total) by mouth daily.   No facility-administered encounter medications on file as of 07/20/2017.     Activities of Daily Living In your present state of health, do you have any difficulty performing the following activities: 05/30/2017 05/10/2017  Hearing? N N  Vision? N N  Difficulty concentrating or making decisions? N N  Walking or climbing stairs? Y Y  Dressing or bathing? Y Y  Doing errands, shopping? Y Y  Some recent data might be hidden    Patient Care Team: Jones, Thomas L, MD as PCP - General Physicians, [employer] Tananis, Leonard, MD as Referring Physician (Physical Medicine and Rehabilitation)    Assessment:    Physical assessment deferred to PCP.  Exercise Activities and Dietary recommendations   Diet (meal preparation, eat out, water intake, caffeinated beverages, dairy products, fruits and vegetables): {Desc; diets:16563}      Goals    . patient          Patient desires to get off oxygen; stopped smoking May 2016      . Quit smoking / using tobacco          Smoking;  Educated to avoid secondary smoke Smoking cessation at [facility]: [phone] Meds may help; chatix (Varenicline); Zyban (Bupropion SR); Nicotine Replacement (gum; lozenges; patches; etc.)  quit line 1-800-QUIT-NOW (1-800-784-8669).          Fall Risk Fall Risk  04/27/2017  08/17/2016 06/10/2016 04/16/2015 02/06/2015  Falls in the past year? Yes No No No No  Number falls in past yr: 2 or more - - - -  Injury with Fall? No - - - -  Risk for fall due to : Impaired balance/gait - - - -   Depression Screen PHQ 2/9 Scores 08/17/2016 06/10/2016 12/24/2015 04/16/2015  PHQ - 2 Score 0 0 0 0     Cognitive Function MMSE - Mini Mental State Exam 02/06/2015  Not completed: Unable to complete        Immunization History  Administered Date(s) Administered  . Influenza Split 10/14/2011, 08/23/2012  . Influenza Whole 10/23/2008, 08/06/2010  . Influenza,inj,Quad PF,6+ Mos 07/27/2013, 09/30/2014, 07/01/2015, 06/08/2016  . Pneumococcal Conjugate-13 08/05/2015  .   Pneumococcal Polysaccharide-23 08/23/2012  . Td 10/04/1998, 08/06/2010   Screening Tests Health Maintenance  Topic Date Due  . MAMMOGRAM  06/23/2017  . INFLUENZA VACCINE  01/02/2018 (Originally 05/04/2017)  . PNEUMOCOCCAL POLYSACCHARIDE VACCINE (2) 08/23/2017  . HEMOGLOBIN A1C  10/25/2017  . URINE MICROALBUMIN  12/16/2017  . TETANUS/TDAP  08/06/2020  . Hepatitis C Screening  Completed  . HIV Screening  Completed  . FOOT EXAM  Excluded  . PAP SMEAR  Excluded  . OPHTHALMOLOGY EXAM  Excluded  . COLONOSCOPY  Excluded      Plan:    I have personally reviewed and noted the following in the patient's chart:   . Medical and social history . Use of alcohol, tobacco or illicit drugs  . Current medications and supplements . Functional ability and status . Nutritional status . Physical activity . Advanced directives . List of other physicians . Vitals . Screenings to include cognitive, depression, and falls . Referrals and appointments  In addition, I have reviewed and discussed with patient certain preventive protocols, quality metrics, and best practice recommendations. A written personalized care plan for preventive services as well as general preventive health recommendations were provided to patient.      Michiel Cowboy, RN  07/19/2017

## 2017-07-20 ENCOUNTER — Ambulatory Visit: Payer: Self-pay

## 2017-07-26 ENCOUNTER — Telehealth: Payer: Self-pay

## 2017-07-26 NOTE — Telephone Encounter (Signed)
On 07/26/17 I received a d/c from [facility] (original). The d/c is for burial. The patient is a patient of Doctor Ronnald Ramp. The d/c will be taken to [facility] this pm for signature.  On 07/27/17 I received the d/c back from Doctor Ronnald Ramp. I got the d/c ready and called the funeral home to let them know the d/c is ready for pickup.

## 2017-08-04 DEATH — deceased

## 2017-08-19 ENCOUNTER — Encounter: Payer: Self-pay | Admitting: Radiation Therapy

## 2017-08-19 NOTE — Progress Notes (Signed)
Date of Death August 15, 2017

## 2018-02-04 IMAGING — CR DG CHEST 2V
2 series · 2 of 2 positions shown · non-contrast
Comparison: 01/05/2017, CT 11/18/2016

CLINICAL DATA: Altered mental status

EXAM:
CHEST  2 VIEW

[chest lat]
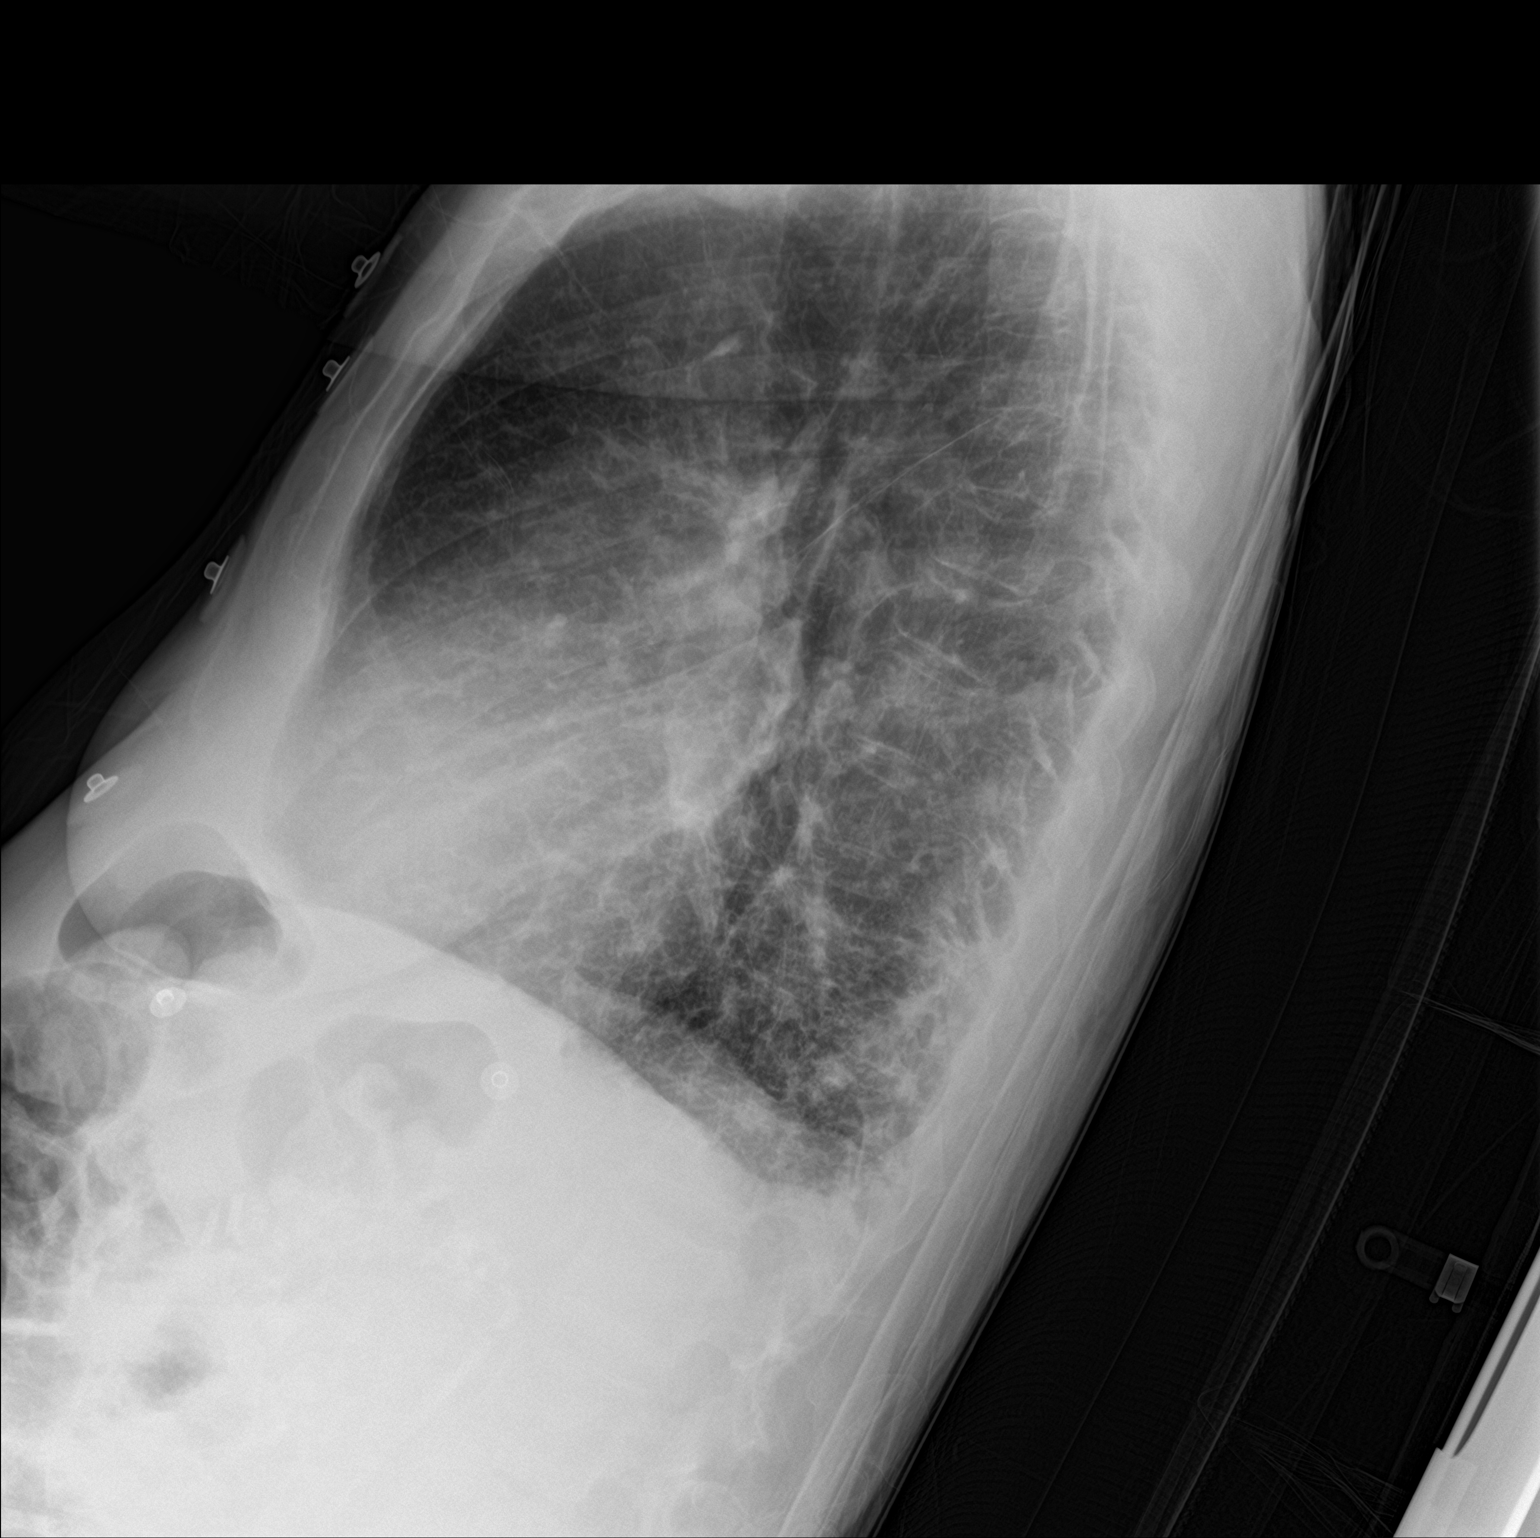

[chest ap]
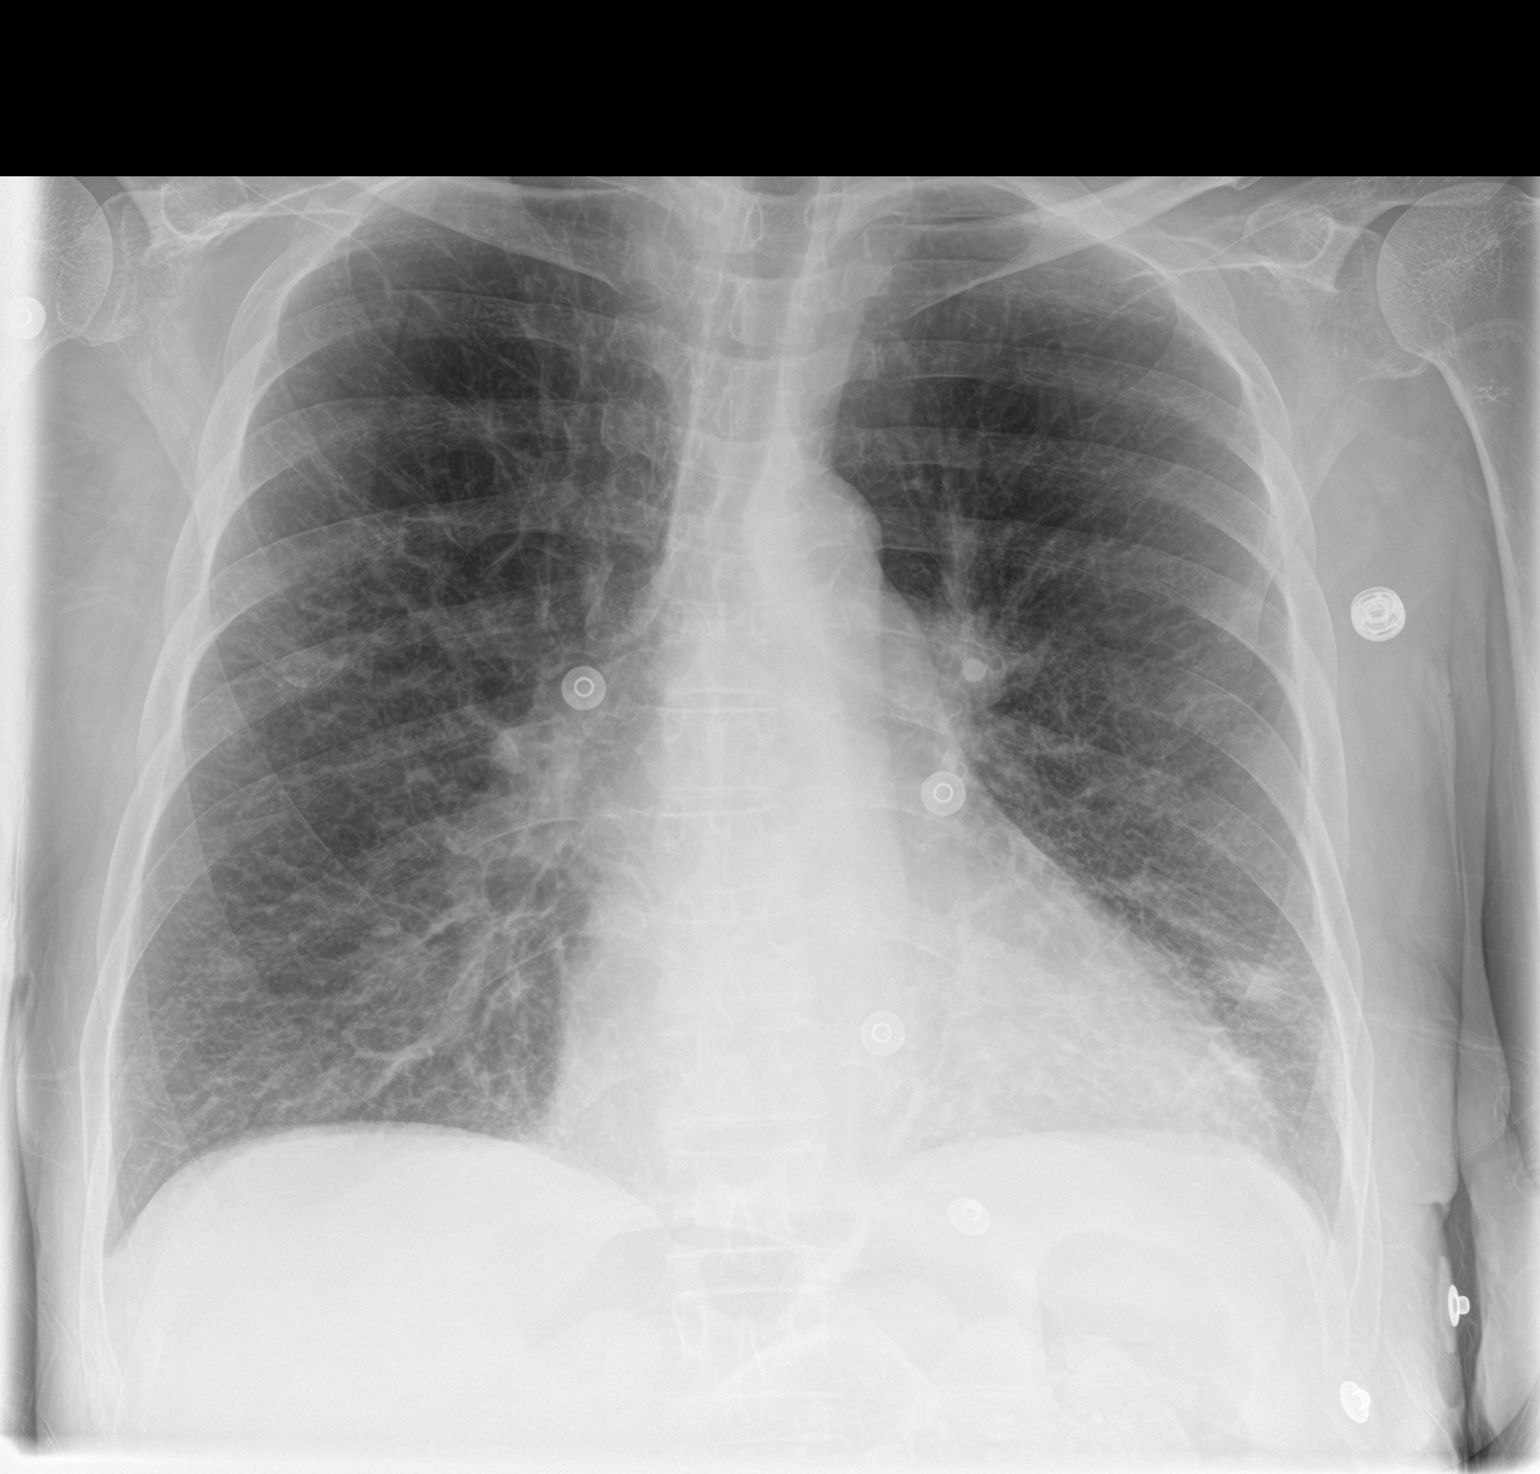

[2 of 2 positions shown; findings below may reference images not displayed]

FINDINGS: There is hyperinflation of the lungs compatible with COPD. The
nodular density noted at the left lung base is unchanged since
recent CT. There is cardiomegaly. No confluent acute airspace
opacities or effusions.
IMPRESSION: Stable COPD and left lower lobe pulmonary nodule.

Mild cardiomegaly.

## 2018-02-04 IMAGING — MR MR HEAD WO/W CM
10 of 13 series · 33 of 48 positions shown · IV contrast (multihance)
Comparison: Head CT without contrast today. Brain MRI without and
with contrast 05/29/2016.

CLINICAL DATA: 60-year-old female with altered mental status. Lung
cancer diagnosed in May 2016. Abnormal noncontrast head CT 2022
hours today.

EXAM:
MRI HEAD WITHOUT AND WITH CONTRAST
TECHNIQUE: Multiplanar, multiecho pulse sequences of the brain and surrounding
structures were obtained without and with intravenous contrast.
CONTRAST:  10mL MULTIHANCE GADOBENATE DIMEGLUMINE 529 MG/ML IV SOLN

[Series 3: DWI · axial · 3.0mm · 1.09mm/px · z∈[-57,+68]mm · 9 of 90 slices shown (1 of 4)]
[im 1/90]
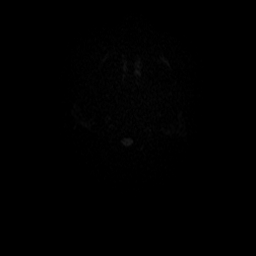
[im 12/90]
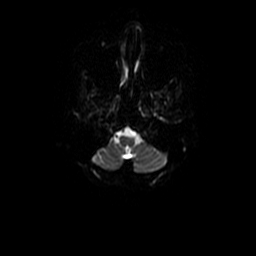
[im 23/90]
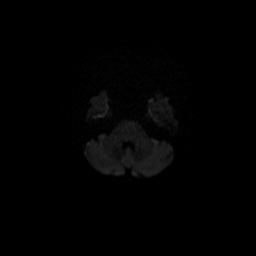
[im 34/90]
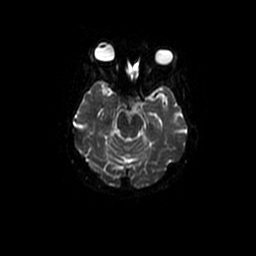
[im 45/90]
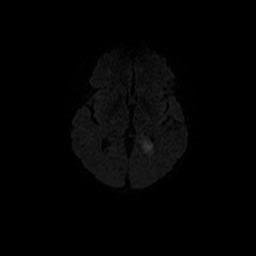
[im 56/90]
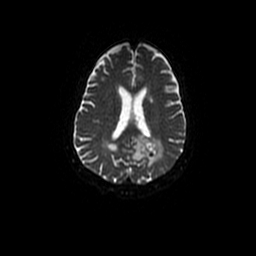
[im 67/90]
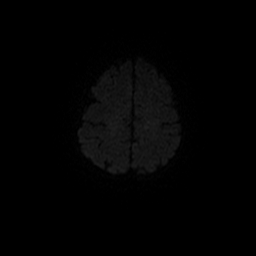
[im 78/90]
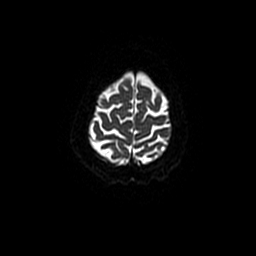
[im 90/90]
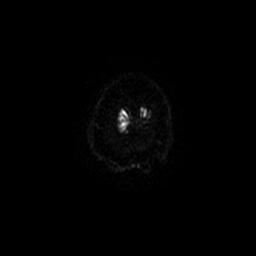

[Series 4: T1 · sagittal · 5.0mm · 0.47mm/px · 1 of 22 slices shown]
[im 1/22]
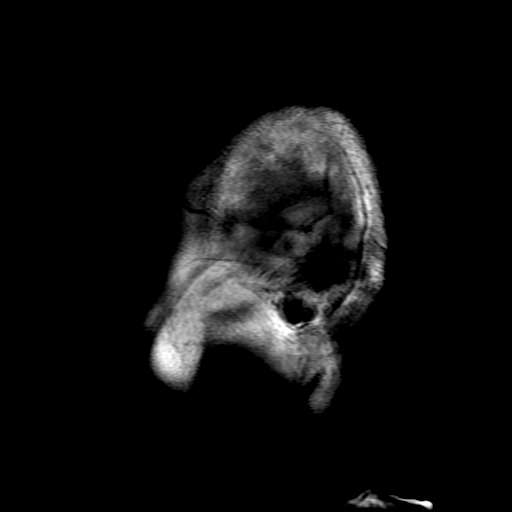

[Series 5: T2 · axial · 5.0mm · 0.43mm/px · z∈[-58,+81]mm · 2 of 25 slices shown (1 of 2)]
[im 1/25]
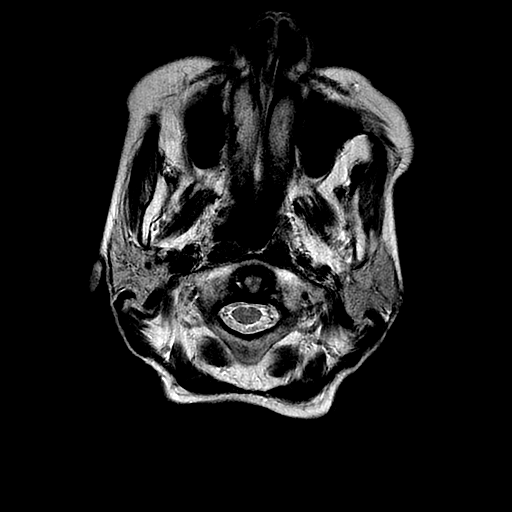
[im 25/25]
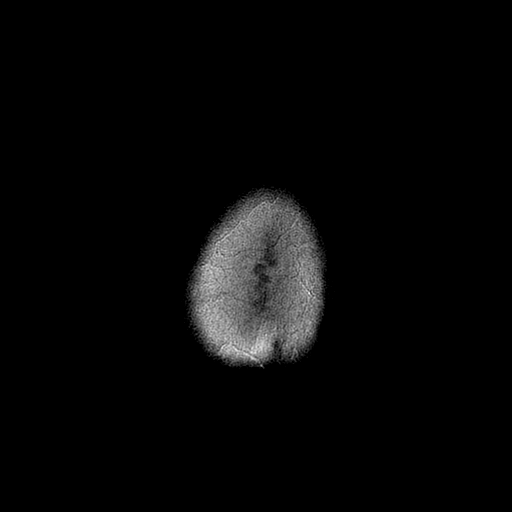

[Series 6: FLAIR · axial · 5.0mm · 0.43mm/px · z∈[-58,+81]mm · 2 of 25 slices shown]
[im 1/25]
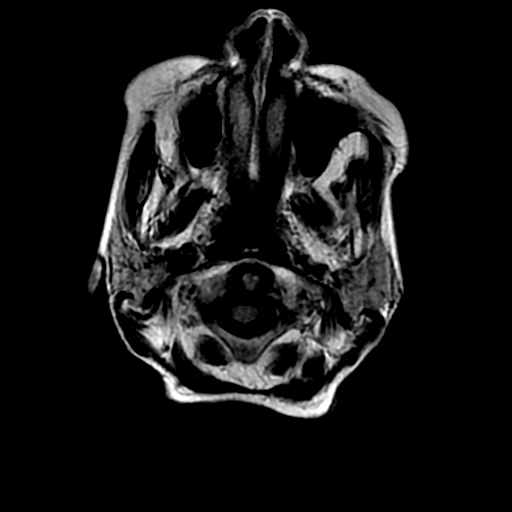
[im 25/25]
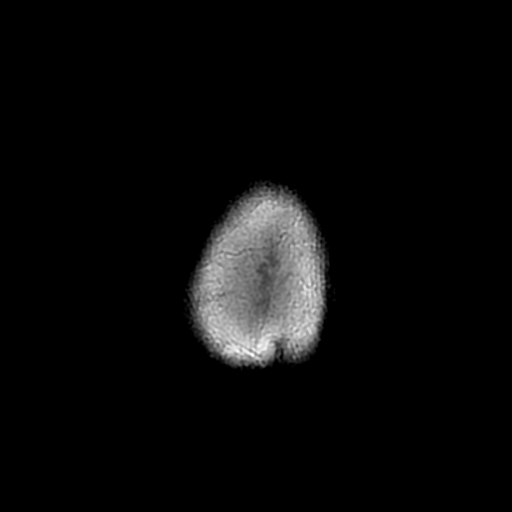

[Series 7: DWI · coronal · 5.0mm · 1.09mm/px · 6 of 66 slices shown (2 of 4)]
[im 1/66]
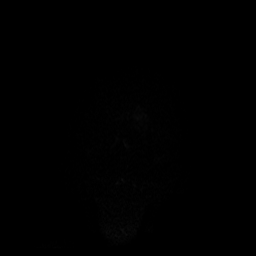
[im 14/66]
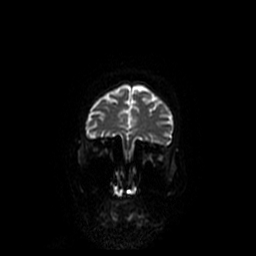
[im 27/66]
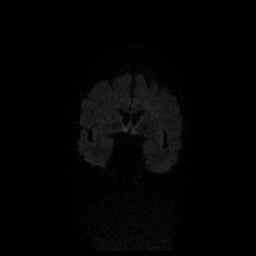
[im 40/66]
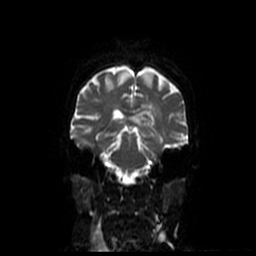
[im 53/66]
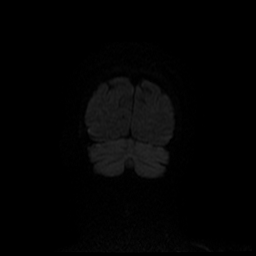
[im 66/66]
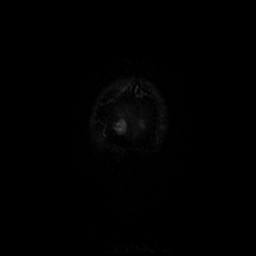

[Series 10: T2 · coronal · 5.0mm · 0.43mm/px · 2 of 28 slices shown (2 of 2)]
[im 1/28]
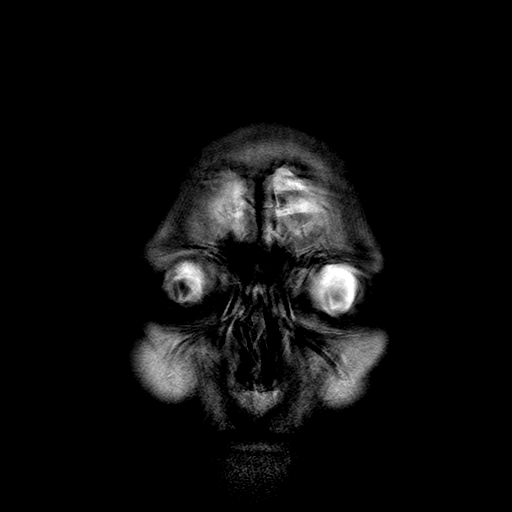
[im 28/28]
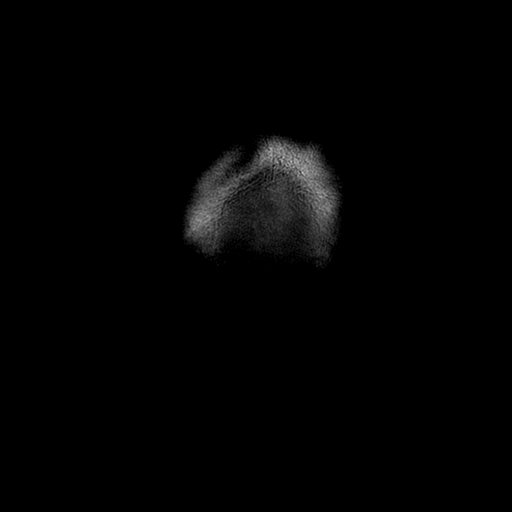

[Series 12: T1 post-contrast · coronal · 5.0mm · 0.43mm/px · 2 of 25 slices shown (1 of 2)]
[im 1/25]
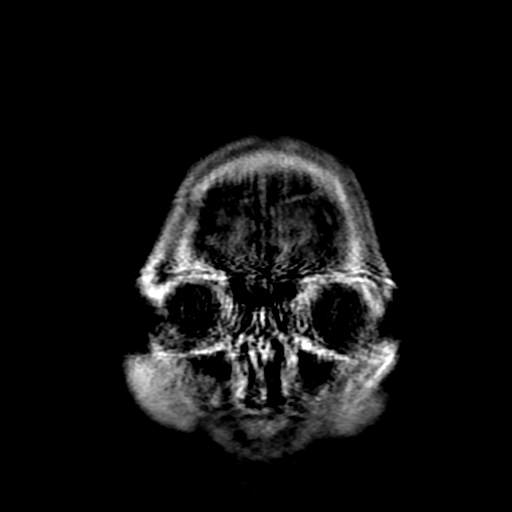
[im 25/25]
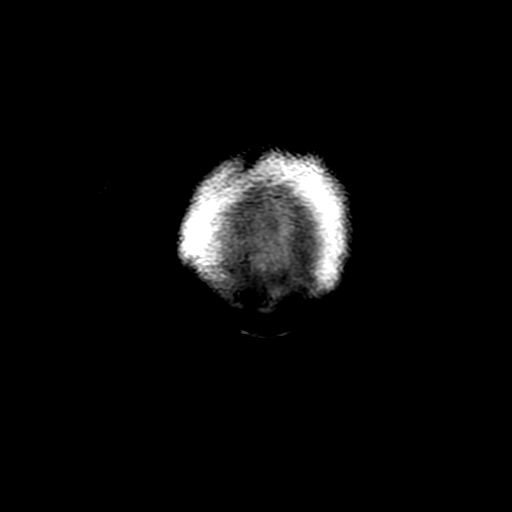

[Series 14: T1 post-contrast · sagittal · 5.0mm · 0.47mm/px · 2 of 22 slices shown (2 of 2)]
[im 1/22]
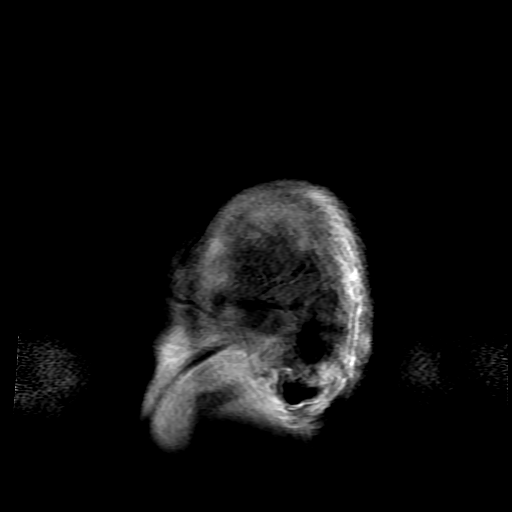
[im 22/22]
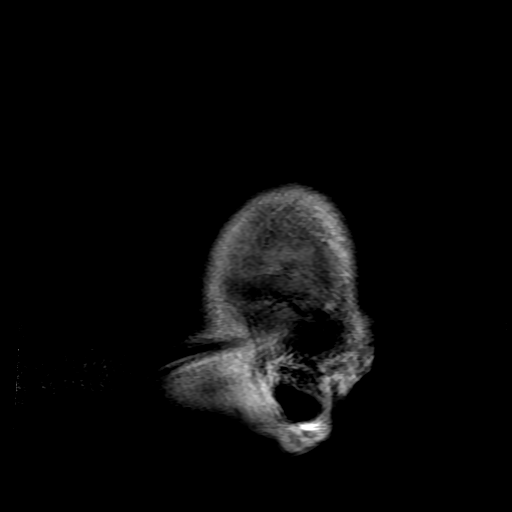

[Series 300: DWI · axial · 3.0mm · 1.09mm/px · z∈[-57,+68]mm · 4 of 45 slices shown (3 of 4)]
[im 1/45]
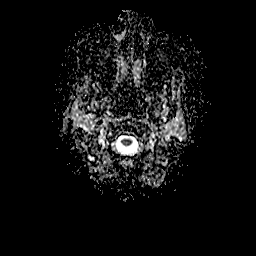
[im 15/45]
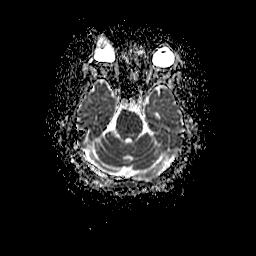
[im 30/45]
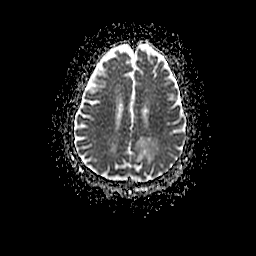
[im 45/45]
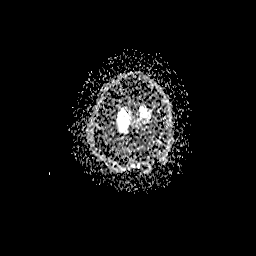

[Series 700: DWI · coronal · 5.0mm · 1.09mm/px · 3 of 33 slices shown (4 of 4)]
[im 1/33]
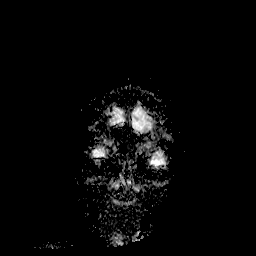
[im 17/33]
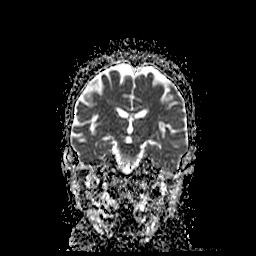
[im 33/33]
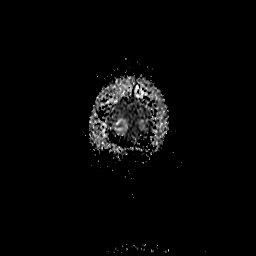

[33 of 48 positions shown; findings below may reference images not displayed]

FINDINGS: Brain:

Study is intermittently degraded by motion artifact despite repeated
imaging attempts.

3 cm heterogeneously enhancing oval mass in the medial left
periatrial white matter. The lesion extends to the subependyma, but
no abnormal ventricular enhancement is identified. Confluent
restricted diffusion within the mass which is T2 hypointense in the
same areas. Evidence of petechial hemorrhage within the mass.
Surrounding T2 and FLAIR hyperintensity compatible with vasogenic
edema which tracks into the posterior and medial left parietal lobe,
and also toward the midline at the splenium of the corpus callosum
(series 5, image 14). Mild associated mass effect on the left
lateral ventricle.

No definite additional mass or abnormal enhancement, although
postcontrast imaging is significantly motion degraded. No other
abnormal diffusion. No ventriculomegaly. No extra-axial collection.
Negative pituitary and cervicomedullary junction.

Underlying chronic periventricular white matter lesions which
resemble chronic demyelinating disease. No cortical
encephalomalacia. Stable gray and white matter signal outside the
region of the enhancing mass. Negative deep gray matter nuclei,
brainstem, and cerebellum.

Vascular: Major intracranial vascular flow voids are stable.

Skull and upper cervical spine: Normal bone marrow signal. Grossly
negative visualized cervical spine.

Sinuses/Orbits: Stable and negative.

Other: Visible internal auditory structures appear normal. Negative
scalp soft tissues.
IMPRESSION: 1. New since May 2016 solitary, hypercellular 3 cm enhancing mass
in the left hemisphere, located in the medial left periatrial white
matter near the splenium of the corpus callosum. Petechial
hemorrhage or mineralization within the mass. Mild surrounding
edema, including extending to the midline of the splenium, but no
significant intracranial mass effect.
2. A solitary metastasis from lung cancer seems most likely in this
clinical setting, although the differential diagnosis includes CNS
Lymphoma, high-grade primary Glioma, and less likely Tumefactive
Demyelination (see # 3).
3. Otherwise stable underlying chronic periventricular white matter
signal abnormality which most resembles chronic demyelinating
disease.
